# Patient Record
Sex: Male | Born: 1950
Health system: Southern US, Community
[De-identification: ages and names within clinical notes are randomized; demographics above are authoritative.]

## PROBLEM LIST (undated history)

## (undated) DIAGNOSIS — K219 Gastro-esophageal reflux disease without esophagitis: Secondary | ICD-10-CM

## (undated) DIAGNOSIS — H269 Unspecified cataract: Secondary | ICD-10-CM

## (undated) DIAGNOSIS — K5790 Diverticulosis of intestine, part unspecified, without perforation or abscess without bleeding: Secondary | ICD-10-CM

## (undated) DIAGNOSIS — I639 Cerebral infarction, unspecified: Secondary | ICD-10-CM

## (undated) DIAGNOSIS — E119 Type 2 diabetes mellitus without complications: Secondary | ICD-10-CM

## (undated) DIAGNOSIS — Z5189 Encounter for other specified aftercare: Secondary | ICD-10-CM

## (undated) DIAGNOSIS — N411 Chronic prostatitis: Secondary | ICD-10-CM

## (undated) DIAGNOSIS — E2740 Unspecified adrenocortical insufficiency: Secondary | ICD-10-CM

## (undated) DIAGNOSIS — C44510 Basal cell carcinoma of anal skin: Secondary | ICD-10-CM

## (undated) DIAGNOSIS — I10 Essential (primary) hypertension: Secondary | ICD-10-CM

## (undated) DIAGNOSIS — T7840XA Allergy, unspecified, initial encounter: Secondary | ICD-10-CM

## (undated) DIAGNOSIS — J129 Viral pneumonia, unspecified: Secondary | ICD-10-CM

## (undated) DIAGNOSIS — Z9481 Bone marrow transplant status: Secondary | ICD-10-CM

## (undated) DIAGNOSIS — E785 Hyperlipidemia, unspecified: Secondary | ICD-10-CM

## (undated) DIAGNOSIS — F528 Other sexual dysfunction not due to a substance or known physiological condition: Secondary | ICD-10-CM

## (undated) HISTORY — DX: Viral pneumonia, unspecified: J12.9

## (undated) HISTORY — DX: Cerebral infarction, unspecified: I63.9

## (undated) HISTORY — DX: Encounter for other specified aftercare: Z51.89

## (undated) HISTORY — DX: Type 2 diabetes mellitus without complications: E11.9

## (undated) HISTORY — DX: Gastro-esophageal reflux disease without esophagitis: K21.9

## (undated) HISTORY — DX: Diverticulosis of intestine, part unspecified, without perforation or abscess without bleeding: K57.90

## (undated) HISTORY — DX: Hyperlipidemia, unspecified: E78.5

## (undated) HISTORY — DX: Unspecified cataract: H26.9

## (undated) HISTORY — DX: Chronic prostatitis: N41.1

## (undated) HISTORY — PX: OTHER SURGICAL HISTORY: SHX169

## (undated) HISTORY — DX: Allergy, unspecified, initial encounter: T78.40XA

## (undated) HISTORY — DX: Other sexual dysfunction not due to a substance or known physiological condition: F52.8

## (undated) HISTORY — PX: SCALP LESION REMOVAL W/ FLAP AND SKIN GRAFT: SHX2376

## (undated) HISTORY — DX: Bone marrow transplant status: Z94.81

## (undated) HISTORY — DX: Basal cell carcinoma of anal skin: C44.510

## (undated) HISTORY — DX: Essential (primary) hypertension: I10

## (undated) HISTORY — PX: EYE SURGERY: SHX253

---

## 2004-12-25 ENCOUNTER — Ambulatory Visit: Payer: Self-pay | Admitting: Internal Medicine

## 2004-12-27 ENCOUNTER — Encounter: Admission: RE | Admit: 2004-12-27 | Discharge: 2004-12-27 | Payer: Self-pay | Admitting: Internal Medicine

## 2005-01-04 ENCOUNTER — Ambulatory Visit: Payer: Self-pay | Admitting: Internal Medicine

## 2005-01-18 ENCOUNTER — Ambulatory Visit: Payer: Self-pay | Admitting: Internal Medicine

## 2005-07-25 ENCOUNTER — Ambulatory Visit: Payer: Self-pay | Admitting: Internal Medicine

## 2005-07-28 ENCOUNTER — Ambulatory Visit: Payer: Self-pay | Admitting: Internal Medicine

## 2005-12-27 ENCOUNTER — Ambulatory Visit: Payer: Self-pay | Admitting: Internal Medicine

## 2006-01-03 ENCOUNTER — Ambulatory Visit: Payer: Self-pay | Admitting: Internal Medicine

## 2006-02-06 ENCOUNTER — Ambulatory Visit: Payer: Self-pay | Admitting: Internal Medicine

## 2006-02-28 ENCOUNTER — Ambulatory Visit: Payer: Self-pay | Admitting: Internal Medicine

## 2007-01-24 ENCOUNTER — Ambulatory Visit: Payer: Self-pay | Admitting: Internal Medicine

## 2007-01-24 LAB — CONVERTED CEMR LAB
Albumin: 4.1 g/dL (ref 3.5–5.2)
Alkaline Phosphatase: 49 units/L (ref 39–117)
BUN: 13 mg/dL (ref 6–23)
Basophils Absolute: 0 10*3/uL (ref 0.0–0.1)
Bilirubin Urine: NEGATIVE
Cholesterol: 174 mg/dL (ref 0–200)
Eosinophils Absolute: 0.1 10*3/uL (ref 0.0–0.6)
GFR calc Af Amer: 99 mL/min
GFR calc non Af Amer: 82 mL/min
Glucose, Urine, Semiquant: NEGATIVE
HCT: 43 % (ref 39.0–52.0)
HDL: 43 mg/dL (ref >39.0)
Hemoglobin: 15 g/dL (ref 13.0–17.0)
Ketones, urine, test strip: NEGATIVE
LDL Cholesterol: 104 mg/dL — ABNORMAL HIGH (ref 0–99)
MCHC: 34.8 g/dL (ref 30.0–36.0)
MCV: 91.6 fL (ref 78.0–100.0)
Monocytes Absolute: 0.5 10*3/uL (ref 0.2–0.7)
Monocytes Relative: 10.2 % (ref 3.0–11.0)
Neutro Abs: 2.7 10*3/uL (ref 1.4–7.7)
Neutrophils Relative %: 55.2 % (ref 43.0–77.0)
PSA, Free Pct: 22 — ABNORMAL LOW (ref >25)
PSA, Free: 0.4 ng/mL
PSA: 1.8 ng/mL (ref 0.10–4.00)
Potassium: 4.1 meq/L (ref 3.5–5.1)
Protein, U semiquant: NEGATIVE
RDW: 12.6 % (ref 11.5–14.6)
Sodium: 141 meq/L (ref 135–145)
TSH: 3.44 microintl units/mL (ref 0.35–5.50)
Total Bilirubin: 1.5 mg/dL — ABNORMAL HIGH (ref 0.3–1.2)
Urobilinogen, UA: 0.2
pH: 6

## 2007-02-04 DIAGNOSIS — I10 Essential (primary) hypertension: Secondary | ICD-10-CM

## 2007-02-04 DIAGNOSIS — E785 Hyperlipidemia, unspecified: Secondary | ICD-10-CM | POA: Insufficient documentation

## 2007-02-04 HISTORY — DX: Hyperlipidemia, unspecified: E78.5

## 2007-02-04 HISTORY — DX: Essential (primary) hypertension: I10

## 2007-02-07 ENCOUNTER — Ambulatory Visit: Payer: Self-pay | Admitting: Internal Medicine

## 2007-02-07 DIAGNOSIS — F528 Other sexual dysfunction not due to a substance or known physiological condition: Secondary | ICD-10-CM | POA: Insufficient documentation

## 2007-02-07 HISTORY — DX: Other sexual dysfunction not due to a substance or known physiological condition: F52.8

## 2007-10-14 ENCOUNTER — Telehealth: Payer: Self-pay | Admitting: Internal Medicine

## 2007-10-30 ENCOUNTER — Telehealth: Payer: Self-pay | Admitting: Internal Medicine

## 2007-12-04 ENCOUNTER — Telehealth: Payer: Self-pay | Admitting: Internal Medicine

## 2008-06-21 ENCOUNTER — Telehealth: Payer: Self-pay | Admitting: Internal Medicine

## 2008-07-01 ENCOUNTER — Ambulatory Visit: Payer: Self-pay | Admitting: Internal Medicine

## 2008-07-01 LAB — CONVERTED CEMR LAB
AST: 25 units/L (ref 0–37)
Alkaline Phosphatase: 45 units/L (ref 39–117)
Bilirubin, Direct: 0.2 mg/dL (ref 0.0–0.3)
Cholesterol: 168 mg/dL (ref 0–200)
LDL Cholesterol: 101 mg/dL — ABNORMAL HIGH (ref 0–99)
Total Protein: 6.9 g/dL (ref 6.0–8.3)
Triglycerides: 89 mg/dL (ref 0–149)

## 2008-07-07 ENCOUNTER — Ambulatory Visit: Payer: Self-pay | Admitting: Internal Medicine

## 2008-07-07 DIAGNOSIS — R972 Elevated prostate specific antigen [PSA]: Secondary | ICD-10-CM

## 2008-07-07 LAB — CONVERTED CEMR LAB: LDL Goal: 130 mg/dL

## 2008-09-01 ENCOUNTER — Ambulatory Visit: Payer: Self-pay | Admitting: Internal Medicine

## 2008-09-01 LAB — CONVERTED CEMR LAB
PSA, Free Pct: 19 — ABNORMAL LOW (ref >25)
PSA, Free: 0.4 ng/mL
PSA: 1.96 ng/mL (ref 0.10–4.00)

## 2008-09-08 ENCOUNTER — Ambulatory Visit: Payer: Self-pay | Admitting: Internal Medicine

## 2008-12-10 ENCOUNTER — Ambulatory Visit: Payer: Self-pay | Admitting: Internal Medicine

## 2008-12-10 LAB — CONVERTED CEMR LAB
Albumin: 3.8 g/dL (ref 3.5–5.2)
BUN: 14 mg/dL (ref 6–23)
Basophils Relative: 0.5 % (ref 0.0–3.0)
Bilirubin Urine: NEGATIVE
Cholesterol: 144 mg/dL (ref 0–200)
Creatinine, Ser: 1.1 mg/dL (ref 0.4–1.5)
Eosinophils Absolute: 0.1 10*3/uL (ref 0.0–0.7)
GFR calc non Af Amer: 72.75 mL/min (ref >60)
Hemoglobin, Urine: NEGATIVE
Hemoglobin: 14.9 g/dL (ref 13.0–17.0)
Leukocytes, UA: NEGATIVE
Lymphs Abs: 1.5 10*3/uL (ref 0.7–4.0)
MCHC: 33.9 g/dL (ref 30.0–36.0)
MCV: 92.1 fL (ref 78.0–100.0)
Monocytes Absolute: 0.5 10*3/uL (ref 0.1–1.0)
Neutro Abs: 2.9 10*3/uL (ref 1.4–7.7)
Nitrite: NEGATIVE
PSA: 2.93 ng/mL (ref 0.10–4.00)
Potassium: 4 meq/L (ref 3.5–5.1)
RBC: 4.77 M/uL (ref 4.22–5.81)
RDW: 12.9 % (ref 11.5–14.6)
TSH: 2.74 microintl units/mL (ref 0.35–5.50)
Total Protein, Urine: NEGATIVE mg/dL
Total Protein: 6.8 g/dL (ref 6.0–8.3)
Triglycerides: 108 mg/dL (ref 0.0–149.0)
Urobilinogen, UA: 0.2 (ref 0.0–1.0)
VLDL: 21.6 mg/dL (ref 0.0–40.0)

## 2008-12-17 ENCOUNTER — Ambulatory Visit: Payer: Self-pay | Admitting: Internal Medicine

## 2008-12-17 DIAGNOSIS — N411 Chronic prostatitis: Secondary | ICD-10-CM

## 2008-12-17 HISTORY — DX: Chronic prostatitis: N41.1

## 2009-02-03 ENCOUNTER — Ambulatory Visit: Payer: Self-pay | Admitting: Internal Medicine

## 2009-02-09 ENCOUNTER — Encounter: Payer: Self-pay | Admitting: Internal Medicine

## 2009-02-09 ENCOUNTER — Ambulatory Visit: Payer: Self-pay | Admitting: Internal Medicine

## 2009-02-09 DIAGNOSIS — C44599 Other specified malignant neoplasm of skin of other part of trunk: Secondary | ICD-10-CM

## 2009-08-11 ENCOUNTER — Ambulatory Visit: Payer: Self-pay | Admitting: Internal Medicine

## 2009-08-11 ENCOUNTER — Telehealth: Payer: Self-pay | Admitting: Internal Medicine

## 2009-08-11 LAB — CONVERTED CEMR LAB: PSA: 2.52 ng/mL (ref 0.10–4.00)

## 2009-08-19 ENCOUNTER — Ambulatory Visit: Payer: Self-pay | Admitting: Internal Medicine

## 2009-11-03 ENCOUNTER — Encounter (INDEPENDENT_AMBULATORY_CARE_PROVIDER_SITE_OTHER): Payer: Self-pay | Admitting: *Deleted

## 2009-12-06 ENCOUNTER — Encounter (INDEPENDENT_AMBULATORY_CARE_PROVIDER_SITE_OTHER): Payer: Self-pay | Admitting: *Deleted

## 2009-12-08 ENCOUNTER — Ambulatory Visit: Payer: Self-pay | Admitting: Gastroenterology

## 2009-12-19 ENCOUNTER — Ambulatory Visit: Payer: Self-pay | Admitting: Gastroenterology

## 2009-12-20 ENCOUNTER — Encounter: Payer: Self-pay | Admitting: Gastroenterology

## 2009-12-23 ENCOUNTER — Ambulatory Visit: Payer: Self-pay | Admitting: Internal Medicine

## 2009-12-23 LAB — CONVERTED CEMR LAB
Basophils Absolute: 0 10*3/uL (ref 0.0–0.1)
Bilirubin, Direct: 0.2 mg/dL (ref 0.0–0.3)
Eosinophils Absolute: 0.1 10*3/uL (ref 0.0–0.7)
Eosinophils Relative: 2.6 % (ref 0.0–5.0)
GFR calc non Af Amer: 80 mL/min (ref >60)
Glucose, Bld: 89 mg/dL (ref 70–99)
HDL: 41.4 mg/dL (ref >39.00)
Leukocytes, UA: NEGATIVE
MCHC: 34.5 g/dL (ref 30.0–36.0)
MCV: 92 fL (ref 78.0–100.0)
Monocytes Absolute: 0.5 10*3/uL (ref 0.1–1.0)
Neutrophils Relative %: 46.7 % (ref 43.0–77.0)
Nitrite: NEGATIVE
PSA: 2.45 ng/mL (ref 0.10–4.00)
Platelets: 208 10*3/uL (ref 150.0–400.0)
Potassium: 4.1 meq/L (ref 3.5–5.1)
Sodium: 140 meq/L (ref 135–145)
Specific Gravity, Urine: 1.015 (ref 1.000–1.030)
Total Bilirubin: 0.9 mg/dL (ref 0.3–1.2)
VLDL: 18 mg/dL (ref 0.0–40.0)
WBC: 4.2 10*3/uL — ABNORMAL LOW (ref 4.5–10.5)
pH: 6 (ref 5.0–8.0)

## 2010-01-17 ENCOUNTER — Ambulatory Visit: Payer: Self-pay | Admitting: Internal Medicine

## 2010-07-07 ENCOUNTER — Ambulatory Visit
Admission: RE | Admit: 2010-07-07 | Discharge: 2010-07-07 | Payer: Self-pay | Source: Home / Self Care | Attending: Internal Medicine | Admitting: Internal Medicine

## 2010-07-07 ENCOUNTER — Other Ambulatory Visit: Payer: Self-pay | Admitting: Internal Medicine

## 2010-07-07 LAB — LIPID PANEL
Cholesterol: 156 mg/dL (ref 0–200)
HDL: 48.7 mg/dL (ref >39.00)
LDL Cholesterol: 85 mg/dL (ref 0–99)
Total CHOL/HDL Ratio: 3
Triglycerides: 113 mg/dL (ref 0.0–149.0)
VLDL: 22.6 mg/dL (ref 0.0–40.0)

## 2010-07-07 LAB — HEPATIC FUNCTION PANEL
ALT: 33 U/L (ref 0–53)
AST: 24 U/L (ref 0–37)
Albumin: 3.8 g/dL (ref 3.5–5.2)
Alkaline Phosphatase: 44 U/L (ref 39–117)
Bilirubin, Direct: 0.1 mg/dL (ref 0.0–0.3)
Total Bilirubin: 1 mg/dL (ref 0.3–1.2)
Total Protein: 6.7 g/dL (ref 6.0–8.3)

## 2010-07-13 ENCOUNTER — Encounter: Payer: Self-pay | Admitting: *Deleted

## 2010-07-14 ENCOUNTER — Ambulatory Visit
Admission: RE | Admit: 2010-07-14 | Discharge: 2010-07-14 | Payer: Self-pay | Source: Home / Self Care | Attending: Internal Medicine | Admitting: Internal Medicine

## 2010-07-14 ENCOUNTER — Encounter: Payer: Self-pay | Admitting: *Deleted

## 2010-07-14 DIAGNOSIS — H906 Mixed conductive and sensorineural hearing loss, bilateral: Secondary | ICD-10-CM | POA: Insufficient documentation

## 2010-07-14 DIAGNOSIS — M5010 Cervical disc disorder with radiculopathy, unspecified cervical region: Secondary | ICD-10-CM

## 2010-07-14 DIAGNOSIS — G4733 Obstructive sleep apnea (adult) (pediatric): Secondary | ICD-10-CM | POA: Insufficient documentation

## 2010-07-14 NOTE — Assessment & Plan Note (Signed)
Neck exercises and ergonomic changes at work and if persists will obtain xrays

## 2010-07-14 NOTE — Progress Notes (Signed)
Subjective:     Patient ID: Brandon [name] is a 60 y.o. male.  Hyperlipidemia This is a chronic problem. The current episode started more than 1 year ago. The problem is controlled. Recent lipid tests were reviewed and are normal. He has no history of diabetes, hypothyroidism, liver disease or obesity. Factors aggravating his hyperlipidemia include fatty foods. Pertinent negatives include no chest pain, focal sensory loss, focal weakness, leg pain, myalgias or shortness of breath. Current antihyperlipidemic treatment includes statins. The current treatment provides moderate improvement of lipids. Compliance problems include adherence to diet and adherence to exercise.  Risk factors for coronary artery disease include male sex, family history and dyslipidemia.  Hypertension This is a chronic problem. The current episode started more than 1 year ago. The problem has been gradually improving since onset. The problem is controlled. Pertinent negatives include no chest pain or shortness of breath. Agents associated with hypertension include NSAIDs. Risk factors for coronary artery disease include dyslipidemia, family history and male gender. Past treatments include beta blockers. The current treatment provides significant improvement. There are no compliance problems.  There is no history of angina, kidney disease, CAD/MI or left ventricular hypertrophy. Identifiable causes of hypertension include sleep apnea.  Erectile Dysfunction This is a chronic problem. The current episode started more than 1 year ago. The nature of his difficulty is achieving erection and maintaining erection. Non-physiologic factors contributing to erectile dysfunction are a decreased libido. He reports his erection duration to be 5 to 10 minutes. The symptoms are aggravated by poor sleep and stress. Past treatments include vardenafil. The treatment provided moderate relief. He has been using treatment for 1 to 6 months. Medication  reaction: GERD. Risk factors include hypertension.    The following portions of the patient's history were reviewed and updated as appropriate: allergies, current medications, past family history, past medical history, past social history, past surgical history and problem list.  Review of Systems  Constitutional: Positive for fatigue.  HENT: Negative.   Respiratory: Negative.  Negative for shortness of breath.   Cardiovascular: Negative.  Negative for chest pain.  Gastrointestinal: Negative.   Genitourinary: Positive for decreased libido.  Musculoskeletal: Positive for arthralgias. Negative for myalgias.       Neck shoulder and arm pain  Skin: Negative.   Neurological: Negative for focal weakness.  Psychiatric/Behavioral: Negative.        Objective:   Physical Exam  Constitutional: He is oriented to person, place, and time. He appears well-developed and well-nourished.  HENT:  Head: Normocephalic and atraumatic.  Eyes: Conjunctivae are normal. Pupils are equal, round, and reactive to light.  Neck: Normal range of motion. Neck supple.  Cardiovascular: Normal rate and regular rhythm.   Pulmonary/Chest: Effort normal.  Musculoskeletal: Normal range of motion.  Neurological: He is alert and oriented to person, place, and time.       Assessment:         Plan:

## 2010-07-14 NOTE — Assessment & Plan Note (Signed)
The pts lipids are still at goal but they were better in the summer when he had lost weight and was exercising regularly. The pts reviewed diet and exercized goals and set goal of 10 pounds of weight loss Labs reviewed with pt

## 2010-07-14 NOTE — Assessment & Plan Note (Signed)
The pt remains at goal with high rate of medication compliance

## 2010-07-14 NOTE — Assessment & Plan Note (Signed)
Refill of cialis as needed and  Suggest plant to address social issues

## 2010-07-26 NOTE — Letter (Signed)
Summary: Previsit letter  [facility]  [address] [place], [place] [address]   Phone: [phone]  Fax: [phone]       11/03/2009 MRN: [id number]  [facility] [address] [place], [place]  [address]  Dear Mr. [facility],  Welcome to the Gastroenterology Division at [facility].    You are scheduled to see a nurse for your pre-procedure visit on 12-01-09  at 10:30am on the 3rd floor at [facility], [address]  We ask that you try to arrive at our office 15 minutes prior to your appointment time to allow for check-in.  Your nurse visit will consist of discussing your medical and surgical history, your immediate family medical history, and your medications.    Please bring a complete list of all your medications or, if you prefer, bring the medication bottles and we will list them.  We will need to be aware of both prescribed and over the counter drugs.  We will need to know exact dosage information as well.  If you are on blood thinners (Coumadin, Plavix, Aggrenox, Ticlid, etc.) please call our office today/prior to your appointment, as we need to consult with your physician about holding your medication.   Please be prepared to read and sign documents such as consent forms, a financial agreement, and acknowledgement forms.  If necessary, and with your consent, a friend or relative is welcome to sit-in on the nurse visit with you.  Please bring your insurance card so that we may make a copy of it.  If your insurance requires a referral to see a specialist, please bring your referral form from your primary care physician.  No co-pay is required for this nurse visit.     If you cannot keep your appointment, please call [phone] to cancel or reschedule prior to your appointment date.  This allows [place] the opportunity to schedule an appointment for another patient in need of care.    Thank you for choosing [facility] for your medical  needs.  We appreciate the opportunity to care for you.  Please visit [place] at our website  to learn more about our practice.                     Sincerely.                                                                                                                   The Gastroenterology Division

## 2010-07-26 NOTE — Assessment & Plan Note (Signed)
Summary: cpx /njr/pt rsc/cjr   Vital Signs:  Patient profile:   60 year old male Height:      72 inches Weight:      213 pounds BMI:     28.99 Temp:     98.5 degrees F oral Pulse rate:   61 / minute BP sitting:   110 / 80  (left arm) Cuff size:   large  Vitals Entered By: Kathrynn Speed CMA (January 17, 2010 2:28 PM)  Nutrition Counseling: Patient's BMI is greater than 25 and therefore counseled on weight management options. CC: cpx review labs, src   CC:  cpx review labs and src.  History of Present Illness: The pt was asked about all immunizations, health maint. services that are appropriate to their age and was given guidance on diet exercize  and weight management   Current Medications (verified): 1)  Zocor 40 Mg  Tabs (Simvastatin) .... Once Dailyno More Without Ov 2)  Toprol Xl 50 Mg Tb24 (Metoprolol Succinate) .... 1/2 Once Dailyno More With Ov 3)  Aspirin 81 Mg  Tbec (Aspirin) .... Once Daily 4)  Cvs Folic Acid 400 Mcg  Tabs (Folic Acid) .... Once Daily 5)  Multivitamins   Caps (Multiple Vitamin) .... Once Daily 6)  Cialis 5 Mg Tabs (Tadalafil) .... Take As Directed  Allergies (verified): 1)  ! * Iv Dye  Past History:  Family History: Last updated: 02/07/2007 father with alzheimers mother CVA and CA in her 62's Family History Breast cancer 1st degree relative <50 Family History Hypertension  Social History: Last updated: 02/07/2007 Married Never Smoked Alcohol use-yes Drug use-no Regular exercise-yes Occupation: teaching  Risk Factors: Exercise: yes (02/07/2007)  Risk Factors: Smoking Status: never (08/19/2009)  Past medical, surgical, family and social histories (including risk factors) reviewed, and no changes noted (except as noted below).  Past Medical History: Reviewed history from 02/07/2007 and no changes required. Hyperlipidemia Hypertension hypognadism  Past Surgical History: Reviewed history from 02/07/2007 and no changes  required. Denies surgical history  Family History: Reviewed history from 02/07/2007 and no changes required. father with alzheimers mother CVA and CA in her 46's Family History Breast cancer 1st degree relative <50 Family History Hypertension  Social History: Reviewed history from 02/07/2007 and no changes required. Married Never Smoked Alcohol use-yes Drug use-no Regular exercise-yes Occupation: teaching  Review of Systems  The patient denies anorexia, fever, weight loss, weight gain, vision loss, decreased hearing, hoarseness, chest pain, syncope, dyspnea on exertion, peripheral edema, prolonged cough, headaches, hemoptysis, abdominal pain, melena, hematochezia, severe indigestion/heartburn, hematuria, incontinence, genital sores, muscle weakness, suspicious skin lesions, transient blindness, difficulty walking, depression, unusual weight change, abnormal bleeding, enlarged lymph nodes, angioedema, and breast masses.    Physical Exam  General:  Well-developed,well-nourished,in no acute distress; alert,appropriate and cooperative throughout examination Head:  male-pattern balding.   Eyes:  No corneal or conjunctival inflammation noted. EOMI. Perrla. Funduscopic exam benign, without hemorrhages, exudates or papilledema. Vision grossly normal. Ears:  R ear normal and L ear normal.   Nose:  no external deformity and no nasal discharge.   Mouth:  good dentition and pharynx pink and moist.   Neck:  No deformities, masses, or tenderness noted. Lungs:  Normal respiratory effort, chest expands symmetrically. Lungs are clear to auscultation, no crackles or wheezes. Heart:  Normal rate and regular rhythm. S1 and S2 normal without gallop, murmur, click, rub or other extra sounds. Abdomen:  Bowel sounds positive,abdomen soft and non-tender without masses, organomegaly or hernias noted. Msk:  No deformity or scoliosis noted of thoracic or lumbar spine.   Pulses:  R and L  carotid,radial,femoral,dorsalis pedis and posterior tibial pulses are full and equal bilaterally Extremities:  No clubbing, cyanosis, edema, or deformity noted with normal full range of motion of all joints.   Neurologic:  alert & oriented X3.     Impression & Recommendations:  Problem # 1:  PREVENTIVE HEALTH CARE (ICD-V70.0) The pt was asked about all immunizations, health maint. services that are appropriate to their age and was given guidance on diet exercize  and weight management  Colonoscopy: DONE (12/19/2009) Td Booster: Td (12/05/2005)   Chol: 128 (12/23/2009)   HDL: 41.40 (12/23/2009)   LDL: 69 (12/23/2009)   TG: 90.0 (12/23/2009) TSH: 2.34 (12/23/2009)   PSA: 2.45 (12/23/2009) Next Colonoscopy due:: 11/2014 (12/19/2009)  Discussed using sunscreen, use of alcohol, drug use, self testicular exam, routine dental care, routine eye care, routine physical exam, seat belts, multiple vitamins, osteoporosis prevention, adequate calcium intake in diet, and recommendations for immunizations.  Discussed exercise and checking cholesterol.  Discussed gun safety, safe sex, and contraception. Also recommend checking PSA.  Complete Medication List: 1)  Zocor 40 Mg Tabs (Simvastatin) .... Once dailyno more without ov 2)  Toprol Xl 50 Mg Tb24 (Metoprolol succinate) .... 1/2 once dailyno more with ov 3)  Aspirin 81 Mg Tbec (Aspirin) .... Once daily 4)  Cvs Folic Acid 400 Mcg Tabs (Folic acid) .... Once daily 5)  Multivitamins Caps (Multiple vitamin) .... Once daily 6)  Cialis 5 Mg Tabs (Tadalafil) .... Take as directed  Other Orders: EKG w/ Interpretation (93000)  Patient Instructions: 1)  Please schedule a follow-up appointment in 6 months. 2)  Hepatic Panel prior to visit, ICD-9:995.20 3)  Lipid Panel prior to visit, ICD-9:272.4

## 2010-07-26 NOTE — Letter (Signed)
Summary: [facility] Instructions  [facility]  [address] [place], [place] [address]   Phone: [phone]  Fax: [phone]       Brandon [name]    April 29, 1951    MRN: [id number]        Procedure Day Dorna Bloom:  Duanne Limerick  12/19/09     Arrival Time:  8:00am     Procedure Time:  9:00am     Location of Procedure:                    Juliann Pares  [facility] (4th Floor)                        PREPARATION FOR COLONOSCOPY WITH MOVIPREP   Starting 5 days prior to your procedure  [facility] 12/14/09 do not eat nuts, seeds, popcorn, corn, beans, peas,  salads, or any raw vegetables.  Do not take any fiber supplements (e.g. Metamucil, Citrucel, and Benefiber).  THE DAY BEFORE YOUR PROCEDURE         DATE:  SUNDAY  12/18/09  1.  Drink clear liquids the entire day-NO SOLID FOOD  2.  Do not drink anything colored red or purple.  Avoid juices with pulp.  No orange juice.  3.  Drink at least 64 oz. (8 glasses) of fluid/clear liquids during the day to prevent dehydration and help the prep work efficiently.  CLEAR LIQUIDS INCLUDE: Water Jello Ice Popsicles Tea (sugar ok, no milk/cream) Powdered fruit flavored drinks Coffee (sugar ok, no milk/cream) Gatorade Juice: apple, white grape, white cranberry  Lemonade Clear bullion, consomm, broth Carbonated beverages (any kind) Strained chicken noodle soup Hard Candy                             4.  In the morning, mix first dose of MoviPrep solution:    Empty 1 Pouch A and 1 Pouch B into the disposable container    Add lukewarm drinking water to the top line of the container. Mix to dissolve    Refrigerate (mixed solution should be used within 24 hrs)  5.  Begin drinking the prep at 5:00 p.m. The MoviPrep container is divided by 4 marks.   Every 15 minutes drink the solution down to the next mark (approximately 8 oz) until the full liter is complete.   6.  Follow completed prep with 16 oz of clear liquid of your choice  (Nothing red or purple).  Continue to drink clear liquids until bedtime.  7.  Before going to bed, mix second dose of MoviPrep solution:    Empty 1 Pouch A and 1 Pouch B into the disposable container    Add lukewarm drinking water to the top line of the container. Mix to dissolve    Refrigerate  THE DAY OF YOUR PROCEDURE      DATE:  MONDAY  06/27  Beginning at  4:00 a.m. (5 hours before procedure):         1. Every 15 minutes, drink the solution down to the next mark (approx 8 oz) until the full liter is complete.  2. Follow completed prep with 16 oz. of clear liquid of your choice.    3. You may drink clear liquids until 7:00am  (2 HOURS BEFORE PROCEDURE).   MEDICATION INSTRUCTIONS  Unless otherwise instructed, you should take regular prescription medications with a small sip of water   as  early as possible the morning of your procedure.        OTHER INSTRUCTIONS  You will need a responsible adult at least 61 years of age to accompany you and drive you home.   This person must remain in the waiting room during your procedure.  Wear loose fitting clothing that is easily removed.  Leave jewelry and other valuables at home.  However, you may wish to bring a book to read or  an iPod/MP3 player to listen to music as you wait for your procedure to start.  Remove all body piercing jewelry and leave at home.  Total time from sign-in until discharge is approximately 2-3 hours.  You should go home directly after your procedure and rest.  You can resume normal activities the  day after your procedure.  The day of your procedure you should not:   Drive   Make legal decisions   Operate machinery   Drink alcohol   Return to work  You will receive specific instructions about eating, activities and medications before you leave.    The above instructions have been reviewed and explained to me by   Wyona Almas RN  December 08, 2009 9:34 AM     I fully understand and  can verbalize these instructions _____________________________ Date _________

## 2010-07-26 NOTE — Miscellaneous (Signed)
Summary: [facility] Previsit/prep  Clinical Lists Changes  Medications: Added new medication of MOVIPREP 100 GM  SOLR (PEG-KCL-NACL-NASULF-NA ASC-C) As per prep instructions. - Signed Rx of MOVIPREP 100 GM  SOLR (PEG-KCL-NACL-NASULF-NA ASC-C) As per prep instructions.;  #1 x 0;  Signed;  Entered by: Wyona Almas RN;  Authorized by: Meryl Dare MD Clementeen Graham;  Method used: Electronically to [id number] [address], [place], [place], [place]  [address], Ph: [phone] or [phone], Fax: [phone] Allergies: Added new allergy or adverse reaction of * IV DYE Observations: Added new observation of NKA: F (12/08/2009 8:57)    Prescriptions: MOVIPREP 100 GM  SOLR (PEG-KCL-NACL-NASULF-NA ASC-C) As per prep instructions.  #1 x 0   Entered by:   Wyona Almas RN   Authorized by:   Meryl Dare MD [facility]   Signed by:   Wyona Almas RN on 12/08/2009   Method used:   Electronically to        [facility] [phone]* (retail)       [address]       [place], [place]  [address]       Ph: [phone] or [phone]       Fax: [phone]   RxID:   [id number]

## 2010-07-26 NOTE — Procedures (Signed)
Summary: Colonoscopy  Patient: Brandon [name] Note: All result statuses are Final unless otherwise noted.  Tests: (1) Colonoscopy (COL)   COL Colonoscopy           DONE     [facility]     [address]     [place], [place]  [address]           COLONOSCOPY PROCEDURE REPORT           PATIENT:  Brandon [name], Brandon [name]  MR#:  [id number]     BIRTHDATE:  Jan 03, 1951, 59 yrs. old  GENDER:  male     ENDOSCOPIST:  Judie Petit T. Russella Dar, MD, [facility]     Referred by:  Stacie Glaze, M.D.     PROCEDURE DATE:  12/19/2009     PROCEDURE:  Colonoscopy with snare polypectomy     ASA CLASS:  Class II     INDICATIONS:  1) Routine Risk Screening     MEDICATIONS:   Fentanyl 75 mcg IV, Versed 7 mg IV     DESCRIPTION OF PROCEDURE:   After the risks benefits and     alternatives of the procedure were thoroughly explained, informed     consent was obtained.  Digital rectal exam was performed and     revealed no abnormalities.   The LB [id number] endoscope     was introduced through the anus and advanced to the cecum, which     was identified by both the appendix and ileocecal valve, without     limitations.  The quality of the prep was excellent, using     MoviPrep.  The instrument was then slowly withdrawn as the colon     was fully examined.     <<PROCEDUREIMAGES>>     FINDINGS:  Two polyps were found in the ascending colon. They were     3-5 mm in size. Polyps were snared without cautery. Retrieval was     successful. Mild diverticulosis was found in the sigmoid colon.  A     normal appearing cecum, ileocecal valve, and appendiceal orifice     were identified. The hepatic flexure, transverse, splenic flexure,     descending colon, and rectum appeared unremarkable. Retroflexed     views in the rectum revealed internal hemorrhoids, small.  The     time to cecum =  3.25  minutes. The scope was then withdrawn (time     =  11.25  min) from the patient and the procedure completed.        COMPLICATIONS:  None           ENDOSCOPIC IMPRESSION:     1) 3 - 5 mm, two polyps in the ascending colon     2) Mild diverticulosis in the sigmoid colon     3) Internal hemorrhoids           RECOMMENDATIONS:     1) Await pathology results     2) High fiber diet with liberal fluid intake.     3) If the polyps removed today are adenomatous (pre-cancerous),     you will need a repeat colonoscopy in 5 years. Otherwise you     should continue to follow colorectal cancer screening guidelines     for "routine risk" patients with colonoscopy in 10 years.     Venita Lick. Russella Dar, MD, Clementeen Graham           n.     eSIGNED:   Venita Lick. Russella Dar at  12/19/2009 09:21 AM           Junius Argyle, [id number]  Note: An exclamation mark (!) indicates a result that was not dispersed into the flowsheet. Document Creation Date: 12/19/2009 9:22 AM _______________________________________________________________________  (1) Order result status: Final Collection or observation date-time: 12/19/2009 09:18 Requested date-time:  Receipt date-time:  Reported date-time:  Referring Physician:   Ordering Physician: Claudette Head [phone]) Specimen Source:  Source: Launa Grill Order Number: [id number] Lab site:   Appended Document: Colonoscopy     Procedures Next Due Date:    Colonoscopy: 11/2014

## 2010-07-26 NOTE — Letter (Signed)
Summary: Patient Notice- Polyp Results  [facility]  [address] [place], [place] [address]   Phone: [phone]  Fax: [phone]        December 20, 2009 MRN: [id number]    [facility] [address] [place], [place]  [address]    Dear Mr. Tiemann,  I am pleased to inform you that the colon polyp(s) removed during your recent colonoscopy was (were) found to be benign (no cancer detected) upon pathologic examination.  I recommend you have a repeat colonoscopy examination in 5 years to look for recurrent polyps, as having colon polyps increases your risk for having recurrent polyps or even colon cancer in the future.  Should you develop new or worsening symptoms of abdominal pain, bowel habit changes or bleeding from the rectum or bowels, please schedule an evaluation with either your primary care physician or with me.  Continue treatment plan as outlined the day of your exam.  Please call us if you are having persistent problems or have questions about your condition that have not been fully answered at this time.  Sincerely,  Meryl Dare MD [facility]  This letter has been electronically signed by your physician.  Appended Document: Patient Notice- Polyp Results letter mailed 7.6.11

## 2010-07-26 NOTE — Assessment & Plan Note (Signed)
Summary: 6 month rov/njr   Vital Signs:  Patient profile:   60 year old male Height:      72 inches Weight:      220 pounds BMI:     29.95 Temp:     98.2 degrees F oral Pulse rate:   72 / minute Resp:     14 per minute BP sitting:   110 / 80  (left arm)  Vitals Entered By: Willy Eddy, LPN (August 19, 2009 4:27 PM) CC: roas labs   CC:  roas labs.  History of Present Illness: THE PT PRESENT FOR PROSTATE INFECTION AND ELEVATED PSA  Preventive Screening-Counseling & Management  Alcohol-Tobacco     Smoking Status: never  Problems Prior to Update: 1)  Neoplasm, Malignant, Skin, Back  (ICD-173.5) 2)  Chronic Prostatitis  (ICD-601.1) 3)  Prostate Specific Antigen, Elevated  (ICD-790.93) 4)  Family History Breast Cancer 1st Degree Relative <50  (ICD-V16.3) 5)  Erectile Dysfunction  (ICD-302.72) 6)  Hypertension  (ICD-401.9) 7)  Hyperlipidemia  (ICD-272.4) 8)  Preventive Health Care  (ICD-V70.0)  Current Problems (verified): 1)  Neoplasm, Malignant, Skin, Back  (ICD-173.5) 2)  Chronic Prostatitis  (ICD-601.1) 3)  Prostate Specific Antigen, Elevated  (ICD-790.93) 4)  Family History Breast Cancer 1st Degree Relative <50  (ICD-V16.3) 5)  Erectile Dysfunction  (ICD-302.72) 6)  Hypertension  (ICD-401.9) 7)  Hyperlipidemia  (ICD-272.4) 8)  Preventive Health Care  (ICD-V70.0)  Medications Prior to Update: 1)  Zocor 40 Mg  Tabs (Simvastatin) .... Once Dailyno More Without Ov 2)  Toprol Xl 50 Mg Tb24 (Metoprolol Succinate) .... 1/2 Once Dailyno More With Ov 3)  Aspirin 81 Mg  Tbec (Aspirin) .... Once Daily 4)  Cvs Folic Acid 400 Mcg  Tabs (Folic Acid) .... Once Daily 5)  Multivitamins   Caps (Multiple Vitamin) .... Once Daily 6)  Cialis 5 Mg Tabs (Tadalafil) .... Take As Directed 7)  Malarone 250-100 Mg Tabs (Atovaquone-Proguanil Hcl) .... One By Mouth Daily One Day Prior and 1 Weeks After Trip  Number 16 8)  Ciprofloxacin Hcl 500 Mg Tabs (Ciprofloxacin Hcl) .... One Pp  Two Times A Day For 7 Days 9)  Sulfamethoxazole-Tmp Ds 800-160 Mg Tabs (Sulfamethoxazole-Trimethoprim) .... One By Mouth Two Times A Day For 21 Days  Current Medications (verified): 1)  Zocor 40 Mg  Tabs (Simvastatin) .... Once Dailyno More Without Ov 2)  Toprol Xl 50 Mg Tb24 (Metoprolol Succinate) .... 1/2 Once Dailyno More With Ov 3)  Aspirin 81 Mg  Tbec (Aspirin) .... Once Daily 4)  Cvs Folic Acid 400 Mcg  Tabs (Folic Acid) .... Once Daily 5)  Multivitamins   Caps (Multiple Vitamin) .... Once Daily 6)  Cialis 5 Mg Tabs (Tadalafil) .... Take As Directed 7)  Malarone 250-100 Mg Tabs (Atovaquone-Proguanil Hcl) .... One By Mouth Daily One Day Prior and 1 Weeks After Trip  Number 16 8)  Ciprofloxacin Hcl 500 Mg Tabs (Ciprofloxacin Hcl) .... One Pp Two Times A Day For 7 Days  Allergies (verified): No Known Drug Allergies  Past History:  Family History: Last updated: 02/07/2007 father with alzheimers mother CVA and CA in her 35's Family History Breast cancer 1st degree relative <50 Family History Hypertension  Social History: Last updated: 02/07/2007 Married Never Smoked Alcohol use-yes Drug use-no Regular exercise-yes Occupation: teaching  Risk Factors: Exercise: yes (02/07/2007)  Risk Factors: Smoking Status: never (08/19/2009)  Past medical, surgical, family and social histories (including risk factors) reviewed, and no changes noted (except as  noted below).  Past Medical History: Reviewed history from 02/07/2007 and no changes required. Hyperlipidemia Hypertension hypognadism  Past Surgical History: Reviewed history from 02/07/2007 and no changes required. Denies surgical history  Family History: Reviewed history from 02/07/2007 and no changes required. father with alzheimers mother CVA and CA in her 68's Family History Breast cancer 1st degree relative <50 Family History Hypertension  Social History: Reviewed history from 02/07/2007 and no changes  required. Married Never Smoked Alcohol use-yes Drug use-no Regular exercise-yes Occupation: teaching  Review of Systems  The patient denies anorexia, fever, weight loss, weight gain, vision loss, decreased hearing, hoarseness, chest pain, syncope, dyspnea on exertion, peripheral edema, prolonged cough, headaches, hemoptysis, abdominal pain, melena, hematochezia, severe indigestion/heartburn, hematuria, incontinence, genital sores, muscle weakness, suspicious skin lesions, transient blindness, difficulty walking, depression, unusual weight change, abnormal bleeding, enlarged lymph nodes, angioedema, and breast masses.    Physical Exam  General:  Well-developed,well-nourished,in no acute distress; alert,appropriate and cooperative throughout examination Head:  male-pattern balding.   Ears:  R ear normal and L ear normal.   Nose:  no external deformity and no nasal discharge.   Mouth:  good dentition and pharynx pink and moist.   Neck:  No deformities, masses, or tenderness noted. Lungs:  Normal respiratory effort, chest expands symmetrically. Lungs are clear to auscultation, no crackles or wheezes. Heart:  Normal rate and regular rhythm. S1 and S2 normal without gallop, murmur, click, rub or other extra sounds.   Impression & Recommendations:  Problem # 1:  PROSTATE SPECIFIC ANTIGEN, ELEVATED (ICD-790.93) MONITERING EVERY SIX MONTH  Complete Medication List: 1)  Zocor 40 Mg Tabs (Simvastatin) .... Once dailyno more without ov 2)  Toprol Xl 50 Mg Tb24 (Metoprolol succinate) .... 1/2 once dailyno more with ov 3)  Aspirin 81 Mg Tbec (Aspirin) .... Once daily 4)  Cvs Folic Acid 400 Mcg Tabs (Folic acid) .... Once daily 5)  Multivitamins Caps (Multiple vitamin) .... Once daily 6)  Cialis 5 Mg Tabs (Tadalafil) .... Take as directed 7)  Malarone 250-100 Mg Tabs (Atovaquone-proguanil hcl) .... One by mouth daily one day prior and 1 weeks after trip  number 16 8)  Ciprofloxacin Hcl 500 Mg  Tabs (Ciprofloxacin hcl) .... One pp two times a day for 7 days  Patient Instructions: 1)  Please schedule a follow-up appointment in 6 months.  July CPX  Appended Document: Orders Update     Clinical Lists Changes  Orders: Added new Service order of Est. Patient Level III (16109) - Signed      Appended Document: Orders Update     Clinical Lists Changes  Orders: Added new Referral order of Gastroenterology Referral (GI) - Signed

## 2010-07-26 NOTE — Progress Notes (Signed)
Summary: Rx request  Phone Note From Other Clinic Call back at [facility] Phone [phone] Call back at [phone] cell   Caller: Patient Summary of Call: Pt needs a rx for malerone and cipro in preparation for a 9 day trip to [place]. Please send to [facility] at [address] Initial call taken by: Trixie Dredge,  August 11, 2009 5:00 PM  Follow-up for Phone Call        [facility] 250  per dr Yvonne Kendall have malerone250 mg -start 2 days before going- once daily starting 2 days before arrival and each day while there and 7 days after leaving-cipto 500 two times a day for 7 qdays as needed diarrhea Follow-up by: Willy Eddy, LPN,  August 12, 2009 9:20 AM  Additional Follow-up for Phone Call Additional follow up Details #1::        Rx called. Additional Follow-up by: Raechel Ache, RN,  August 12, 2009 9:42 AM

## 2010-07-27 NOTE — Assessment & Plan Note (Signed)
Summary: 6 month rov/njr   Vital Signs:  Patient profile:   60 year old male Height:      72 inches Weight:      220 pounds BMI:     29.95 Temp:     98.2 degrees F oral Pulse rate:   68 / minute Resp:     14 per minute BP sitting:   130 / 80  (left arm)  Vitals Entered By: Willy Eddy, LPN (July 14, 2010 4:14 PM) CC: roa labs--has been taking otc omeprazole for periodic gerd which helps, Lipid Management Is Patient Diabetic? No Subjective: Patient ID: Brandon [name] is a 60 y.o. male.  Hyperlipidemia  This is a chronic problem. The current episode started more than 1 year ago. The problem is controlled. Recent lipid tests were reviewed and are normal. He has no history of diabetes, hypothyroidism, liver disease or obesity. Factors aggravating his hyperlipidemia include fatty foods. Pertinent negatives include no chest pain, focal sensory loss, focal weakness, leg pain, myalgias or shortness of breath. Current antihyperlipidemic treatment includes statins. The current treatment provides moderate improvement of lipids. Compliance problems include adherence to diet and adherence to exercise. Risk factors for coronary artery disease include male sex, family history and dyslipidemia. Hypertension  This is a chronic problem. The current episode started more than 1 year ago. The problem has been gradually improving since onset. The problem is controlled. Pertinent negatives include no chest pain or shortness of breath. Agents associated with hypertension include NSAIDs. Risk factors for coronary artery disease include dyslipidemia, family history and male gender. Past treatments include beta blockers. The current treatment provides significant improvement. There are no compliance problems. There is no history of angina, kidney disease, CAD/MI or left ventricular hypertrophy. Identifiable causes of hypertension include sleep apnea. Erectile Dysfunction  This is a chronic problem. The  current episode started more than 1 year ago. The nature of his difficulty is achieving erection and maintaining erection. Non-physiologic factors contributing to erectile dysfunction are a decreased libido. He reports his erection duration to be 5 to 10 minutes. The symptoms are aggravated by poor sleep and stress. Past treatments include vardenafil. The treatment provided moderate relief. He has been using treatment for 1 to 6 months. Medication reaction: GERD. Risk factors include hypertension.  The following portions of the patient's history were reviewed and updated as appropriate: allergies, current medications, past family history, past medical history, past social history, past surgical history and problem list.  Review of Systems Constitutional: Positive for fatigue. HENT: Negative. Respiratory: Negative. Negative for shortness of breath. Cardiovascular: Negative. Negative for chest pain. Gastrointestinal: Negative. Genitourinary: Positive for decreased libido. Musculoskeletal: Positive for arthralgias. Negative for myalgias. Neck shoulder and arm pain Skin: Negative. Neurological: Negative for focal weakness. Psychiatric/Behavioral: Negative.   Objective: Physical Exam Constitutional: He is oriented to person, place, and time. He appears well-developed and well-nourished. HENT: Head: Normocephalic and atraumatic. Eyes: Conjunctivae are normal. Pupils are equal, round, and reactive to light. Neck: Normal range of motion. Neck supple. Cardiovascular: Normal rate and regular rhythm. Pulmonary/Chest: Effort normal. Musculoskeletal: Normal range of motion. Neurological: He is alert and oriented to person, place, and time.   Assessment:   Plan:     Primary Care Provider:  Stacie Glaze MD  CC:  roa labs--has been taking otc omeprazole for periodic gerd which helps and Lipid Management.  History of Present Illness: upper extremity joint pain snore and has pauses lipid  follow up hearing loss gerd  with cough better with prilosec prostate   Lipid Management History:      Positive NCEP/ATP III risk factors include male age 32 years old or older and hypertension.  Negative NCEP/ATP III risk factors include non-diabetic, non-tobacco-user status, no ASHD (atherosclerotic heart disease), no prior stroke/TIA, no peripheral vascular disease, and no history of aortic aneurysm.     Preventive Screening-Counseling & Management  Alcohol-Tobacco     Smoking Status: never  Current Problems (verified): 1)  Preventive Health Care  (ICD-V70.0) 2)  Neoplasm, Malignant, Skin, Back  (ICD-173.5) 3)  Chronic Prostatitis  (ICD-601.1) 4)  Prostate Specific Antigen, Elevated  (ICD-790.93) 5)  Family History Breast Cancer 1st Degree Relative <50  (ICD-V16.3) 6)  Erectile Dysfunction  (ICD-302.72) 7)  Hypertension  (ICD-401.9) 8)  Hyperlipidemia  (ICD-272.4) 9)  Preventive Health Care  (ICD-V70.0)  Current Medications (verified): 1)  Zocor 40 Mg  Tabs (Simvastatin) .... Once Dailyno More Without Ov 2)  Toprol Xl 50 Mg Tb24 (Metoprolol Succinate) .... 1/2 Once Dailyno More With Ov 3)  Aspirin 81 Mg  Tbec (Aspirin) .... Once Daily 4)  Cvs Folic Acid 400 Mcg  Tabs (Folic Acid) .... Once Daily 5)  Multivitamins   Caps (Multiple Vitamin) .... Once Daily 6)  Cialis 5 Mg Tabs (Tadalafil) .... Take As Directed 7)  Omeprazole 20 Mg Cpdr (Omeprazole) .Marland Kitchen.. 1 Once Daily As Needed  Allergies (verified): 1)  ! * Iv Dye   Impression & Recommendations:  Problem # 1:  ERECTILE DYSFUNCTION (ICD-302.72)  His updated medication list for this problem includes:    Cialis 5 Mg Tabs (Tadalafil) .Marland Kitchen... Take as directed  Discussed proper use of medications, as well as side effects.   Problem # 2:  HYPERTENSION (ICD-401.9)  His updated medication list for this problem includes:    Toprol Xl 50 Mg Tb24 (Metoprolol succinate) .Marland Kitchen... 1/2 once dailyno more with ov  BP today: 130/80 Prior  BP: 110/80 (01/17/2010)  Prior 10 Yr Risk Heart Disease: 11 % (09/08/2008)  Labs Reviewed: K+: 4.1 (12/23/2009) Creat: : 1.0 (12/23/2009)   Chol: 156 (07/07/2010)   HDL: 48.70 (07/07/2010)   LDL: 85 (07/07/2010)   TG: 113.0 (07/07/2010)  Problem # 3:  HYPERLIPIDEMIA (ICD-272.4)  His updated medication list for this problem includes:    Zocor 40 Mg Tabs (Simvastatin) ..... Once dailyno more without ov  Labs Reviewed: SGOT: 24 (07/07/2010)   SGPT: 33 (07/07/2010)  Lipid Goals: Chol Goal: 200 (07/07/2008)   HDL Goal: 40 (07/07/2008)   LDL Goal: 130 (07/07/2008)   TG Goal: 150 (07/07/2008)  Prior 10 Yr Risk Heart Disease: 11 % (09/08/2008)   HDL:48.70 (07/07/2010), 41.40 (12/23/2009)  LDL:85 (07/07/2010), 69 (12/23/2009)  Chol:156 (07/07/2010), 128 (12/23/2009)  Trig:113.0 (07/07/2010), 90.0 (12/23/2009)  Problem # 4:  SLEEP APNEA, OBSTRUCTIVE, MILD (ICD-327.23)  Complete Medication List: 1)  Zocor 40 Mg Tabs (Simvastatin) .... Once dailyno more without ov 2)  Toprol Xl 50 Mg Tb24 (Metoprolol succinate) .... 1/2 once dailyno more with ov 3)  Aspirin 81 Mg Tbec (Aspirin) .... Once daily 4)  Cvs Folic Acid 400 Mcg Tabs (Folic acid) .... Once daily 5)  Multivitamins Caps (Multiple vitamin) .... Once daily 6)  Cialis 5 Mg Tabs (Tadalafil) .... Take as directed 7)  Omeprazole 20 Mg Cpdr (Omeprazole) .Marland Kitchen.. 1 once daily as needed 8)  Benzoyl Peroxide-erythromycin 5-3 % Gel (Benzoyl peroxide-erythromycin) .... Apply to face two times a day  Other Orders: Audiology (Audio)  Lipid Assessment/Plan:  Based on NCEP/ATP III, the patient's risk factor category is "2 or more risk factors and a calculated 10 year CAD risk of < 20%".  The patient's lipid goals are as follows: Total cholesterol goal is 200; LDL cholesterol goal is 130; HDL cholesterol goal is 40; Triglyceride goal is 150.  His LDL cholesterol goal has been met.    Patient Instructions: 1)  Please schedule a follow-up appointment  in 4 months. 2)  Hepatic Panel prior to visit, ICD-9:995.20 3)  Lipid Panel prior to visit, ICD-9:272.4 Prescriptions: OMEPRAZOLE 20 MG CPDR (OMEPRAZOLE) 1 once daily as needed  #30 x 11   Entered and Authorized by:   Stacie Glaze MD   Signed by:   Stacie Glaze MD on 07/14/2010   Method used:   Electronically to        [facility] [phone]* (retail)       [address]       [place], [place]  [address]       Ph: [phone] or [phone]       Fax: [phone]   RxID:   [id number] BENZOYL PEROXIDE-ERYTHROMYCIN 5-3 % GEL (BENZOYL PEROXIDE-ERYTHROMYCIN) apply to face two times a day  #60gm x 3   Entered and Authorized by:   Stacie Glaze MD   Signed by:   Stacie Glaze MD on 07/14/2010   Method used:   Electronically to        [facility] [phone]* (retail)       [address]       [place], [place]  [address]       Ph: [phone] or [phone]       Fax: [phone]   RxID:   [id number]    Orders Added: 1)  Est. Patient Level IV [83419] 2)  Audiology [Audio]

## 2010-10-16 ENCOUNTER — Other Ambulatory Visit: Payer: Self-pay | Admitting: *Deleted

## 2010-10-16 DIAGNOSIS — K219 Gastro-esophageal reflux disease without esophagitis: Secondary | ICD-10-CM

## 2010-10-17 ENCOUNTER — Telehealth: Payer: Self-pay | Admitting: Internal Medicine

## 2010-10-17 MED ORDER — OMEPRAZOLE MAGNESIUM 20 MG PO TBEC: 20.0000 mg | DELAYED_RELEASE_TABLET | Freq: Every day | ORAL | Status: DC

## 2010-10-17 NOTE — Telephone Encounter (Signed)
Pt called and said that send script for omeprazole otc 42 to [facility] on [address] and [address].

## 2010-11-10 ENCOUNTER — Other Ambulatory Visit: Payer: Self-pay | Admitting: Internal Medicine

## 2010-11-10 ENCOUNTER — Other Ambulatory Visit: Payer: Self-pay

## 2010-11-10 DIAGNOSIS — E785 Hyperlipidemia, unspecified: Secondary | ICD-10-CM

## 2010-11-10 DIAGNOSIS — T887XXA Unspecified adverse effect of drug or medicament, initial encounter: Secondary | ICD-10-CM

## 2010-11-13 ENCOUNTER — Other Ambulatory Visit: Payer: Self-pay | Admitting: Internal Medicine

## 2010-11-13 NOTE — Telephone Encounter (Signed)
rx was faxed to [facility]

## 2010-11-13 NOTE — Telephone Encounter (Signed)
Per drjenkins- may have the oral medication- 1 pill

## 2010-11-13 NOTE — Telephone Encounter (Signed)
rx called in

## 2010-11-13 NOTE — Telephone Encounter (Signed)
Please advise 

## 2010-11-13 NOTE — Telephone Encounter (Signed)
Wants new rx for Oral Typhoid called to [facility]. Going out of town.

## 2010-11-17 ENCOUNTER — Ambulatory Visit: Payer: Self-pay | Admitting: Internal Medicine

## 2010-12-02 ENCOUNTER — Other Ambulatory Visit: Payer: Self-pay | Admitting: Internal Medicine

## 2011-01-16 ENCOUNTER — Encounter: Payer: Self-pay | Admitting: Internal Medicine

## 2011-01-16 ENCOUNTER — Ambulatory Visit (INDEPENDENT_AMBULATORY_CARE_PROVIDER_SITE_OTHER): Payer: BC Managed Care – PPO | Admitting: Internal Medicine

## 2011-01-16 DIAGNOSIS — R109 Unspecified abdominal pain: Secondary | ICD-10-CM

## 2011-01-16 DIAGNOSIS — K5790 Diverticulosis of intestine, part unspecified, without perforation or abscess without bleeding: Secondary | ICD-10-CM | POA: Insufficient documentation

## 2011-01-16 DIAGNOSIS — K5730 Diverticulosis of large intestine without perforation or abscess without bleeding: Secondary | ICD-10-CM

## 2011-01-16 DIAGNOSIS — I10 Essential (primary) hypertension: Secondary | ICD-10-CM

## 2011-01-16 HISTORY — DX: Diverticulosis of intestine, part unspecified, without perforation or abscess without bleeding: K57.90

## 2011-01-16 MED ORDER — METRONIDAZOLE 500 MG PO TABS: 500.0000 mg | ORAL_TABLET | Freq: Three times a day (TID) | ORAL | Status: DC

## 2011-01-16 MED ORDER — CIPROFLOXACIN HCL 500 MG PO TABS: 500.0000 mg | ORAL_TABLET | Freq: Two times a day (BID) | ORAL | Status: DC

## 2011-01-16 MED ORDER — METRONIDAZOLE 500 MG PO TABS: 500.0000 mg | ORAL_TABLET | Freq: Three times a day (TID) | ORAL | Status: AC

## 2011-01-16 NOTE — Patient Instructions (Signed)
Take antibiotic therapy if your abdominal pain worsens or he develops fever  Call or return to clinic prn if these symptoms worsen or fail to improve as anticipated.

## 2011-01-16 NOTE — Progress Notes (Signed)
  Subjective:    Patient ID: Brandon [name], male    DOB: [birth year], 60 y.o.   MRN: [id number]  HPI 60 year old patient who has a history of mild sigmoid diverticulosis. He presents with a two-day history of mildly worsening left lower quadrant pain. Pain was fairly constant throughout the night. Denies any fever nausea vomiting or change in his bowel habits. No similar episodes of similar pain. His appetite is well maintained  Review of Systems  Constitutional: Negative for fever, chills, appetite change and fatigue.  HENT: Negative for hearing loss, ear pain, congestion, sore throat, trouble swallowing, neck stiffness, dental problem, voice change and tinnitus.   Eyes: Negative for pain, discharge and visual disturbance.  Respiratory: Negative for cough, chest tightness, wheezing and stridor.   Cardiovascular: Negative for chest pain, palpitations and leg swelling.  Gastrointestinal: Positive for abdominal pain. Negative for nausea, vomiting, diarrhea, constipation, blood in stool and abdominal distention.  Genitourinary: Negative for urgency, hematuria, flank pain, discharge, difficulty urinating and genital sores.  Musculoskeletal: Negative for myalgias, back pain, joint swelling, arthralgias and gait problem.  Skin: Negative for rash.  Neurological: Negative for dizziness, syncope, speech difficulty, weakness, numbness and headaches.  Hematological: Negative for adenopathy. Does not bruise/bleed easily.  Psychiatric/Behavioral: Negative for behavioral problems and dysphoric mood. The patient is not nervous/anxious.        Objective:   Physical Exam  Constitutional: He appears well-developed and well-nourished. No distress.  Abdominal: Soft. Bowel sounds are normal. He exhibits no distension and no mass. There is tenderness. There is no rebound and no guarding.       Very mild left lower quadrant tenderness without guarding or rebound. Bowel sounds were normal          Assessment  & Plan:   Mild sigmoid diverticulosis. The patient may have mild early diverticulitis. Antibiotic therapy was dispensed but he will hold this for at least one or 2 days and clinically observe if pain worsens or he develops fever will take antibiotic therapy

## 2011-06-10 ENCOUNTER — Other Ambulatory Visit: Payer: Self-pay | Admitting: Internal Medicine

## 2011-06-12 ENCOUNTER — Other Ambulatory Visit: Payer: Self-pay | Admitting: Internal Medicine

## 2011-06-20 ENCOUNTER — Telehealth: Payer: Self-pay

## 2011-06-20 NOTE — Telephone Encounter (Signed)
Pt states he went to Urgent Care on 06/18/11 and was told his lungs sounded all right and was given hycodan cough syrup and Mucinex.   Pt has a fever of 101 today and has been coughing up mucus.  Pt would like to know what Dr. Lovell Sheehan recommends.

## 2011-06-21 ENCOUNTER — Other Ambulatory Visit: Payer: Self-pay | Admitting: *Deleted

## 2011-06-21 MED ORDER — AZITHROMYCIN 250 MG PO TABS: ORAL_TABLET | ORAL | Status: AC

## 2011-06-21 NOTE — Telephone Encounter (Signed)
Had flu vaccine in October at school-- per dr Lovell Sheehan may have z pack and continue taking hycodan-pt informed

## 2011-06-25 ENCOUNTER — Telehealth: Payer: Self-pay | Admitting: *Deleted

## 2011-06-25 ENCOUNTER — Ambulatory Visit: Payer: BC Managed Care – PPO | Admitting: Internal Medicine

## 2011-06-25 NOTE — Telephone Encounter (Signed)
Pt finished zpack yesterday and is still having a fever and cough.  He has been taking mucinex dm.  Per Dr Lovell Sheehan continue current regimen and take advil for fever, zpack should stay in system for 5 days and its unlikely that he has a bacterial infection.  Pt will need to ride it out.  Pt aware

## 2011-10-31 ENCOUNTER — Other Ambulatory Visit: Payer: Self-pay | Admitting: Internal Medicine

## 2011-11-20 ENCOUNTER — Telehealth: Payer: Self-pay | Admitting: Internal Medicine

## 2011-11-20 MED ORDER — SIMVASTATIN 40 MG PO TABS: 40.0000 mg | ORAL_TABLET | Freq: Every day | ORAL | Status: DC

## 2011-11-20 NOTE — Telephone Encounter (Signed)
done

## 2011-11-20 NOTE — Telephone Encounter (Addendum)
Pt called and sched a cpx for 02/15/12 and fasting labs on 02/07/12. Pt is needing refill of simvastatin (ZOCOR) 40 MG tablet  [address] on [address] and [address], to last until his appt date. This was the earliest cpx avail.   Also pt is req a work in appt with Dr Lovell Sheehan in the next few weeks re: diverticulitis pain. Pt refuses to see another doctor.

## 2011-11-20 NOTE — Telephone Encounter (Signed)
Please keep a look out for any openings and schedule if one becomes aval iable

## 2011-11-20 NOTE — Telephone Encounter (Signed)
Called pt and sch for an ov on 12/18/11. Pt said that he will call back periodically to see if there have been any cancellations or pt said if the pain gets too severe, he will call back and sch an ov with any provider but pt said that he does prefer to see pcp.

## 2011-11-21 ENCOUNTER — Ambulatory Visit: Payer: BC Managed Care – PPO | Admitting: Internal Medicine

## 2011-11-26 ENCOUNTER — Ambulatory Visit (INDEPENDENT_AMBULATORY_CARE_PROVIDER_SITE_OTHER): Payer: BC Managed Care – PPO | Admitting: Internal Medicine

## 2011-11-26 ENCOUNTER — Encounter: Payer: Self-pay | Admitting: Internal Medicine

## 2011-11-26 VITALS — BP 130/80 | HR 60 | Temp 98.2°F | Resp 16 | Ht 72.0 in | Wt 218.0 lb

## 2011-11-26 DIAGNOSIS — G4733 Obstructive sleep apnea (adult) (pediatric): Secondary | ICD-10-CM

## 2011-11-26 DIAGNOSIS — K5790 Diverticulosis of intestine, part unspecified, without perforation or abscess without bleeding: Secondary | ICD-10-CM

## 2011-11-26 DIAGNOSIS — I10 Essential (primary) hypertension: Secondary | ICD-10-CM

## 2011-11-26 DIAGNOSIS — E785 Hyperlipidemia, unspecified: Secondary | ICD-10-CM

## 2011-11-26 DIAGNOSIS — K5730 Diverticulosis of large intestine without perforation or abscess without bleeding: Secondary | ICD-10-CM

## 2011-11-26 MED ORDER — SIMVASTATIN 40 MG PO TABS: 40.0000 mg | ORAL_TABLET | Freq: Every day | ORAL | Status: DC

## 2011-11-26 NOTE — Progress Notes (Signed)
Subjective:    Patient ID: Brandon [name], male    DOB: 1951/05/05, 61 y.o.   MRN: [id number]  HPI  To discuss diverticulosis Has note periodic pain in the left lower quadrant that has not progressed to the degree of pain that he has last July. Has noted increased abdominal distension and pain on right and left side. The patient has some hard stools and has been eating salads and fruits which has helped. Eats cerial every AM.    Review of Systems  Constitutional: Negative for fever and fatigue.  HENT: Negative for hearing loss, congestion, neck pain and postnasal drip.   Eyes: Negative for discharge, redness and visual disturbance.  Respiratory: Negative for cough, shortness of breath and wheezing.   Cardiovascular: Negative for leg swelling.  Gastrointestinal: Negative for abdominal pain, constipation and abdominal distention.  Genitourinary: Negative for urgency and frequency.  Musculoskeletal: Negative for joint swelling and arthralgias.  Skin: Negative for color change and rash.  Neurological: Negative for weakness and light-headedness.  Hematological: Negative for adenopathy.  Psychiatric/Behavioral: Negative for behavioral problems.   Past Medical History  Diagnosis Date  . NEOPLASM, MALIGNANT, SKIN, BACK 02/09/2009  . HYPERLIPIDEMIA 02/04/2007  . Chronic prostatitis 12/17/2008  . ERECTILE DYSFUNCTION 02/07/2007  . HYPERTENSION 02/04/2007    History   Social History  . Marital Status: Married    Spouse Name: N/A    Number of Children: N/A  . Years of Education: N/A   Occupational History  . Not on file.   Social History Main Topics  . Smoking status: Never Smoker   . Smokeless tobacco: Not on file  . Alcohol Use:   . Drug Use: No  . Sexually Active: Yes   Other Topics Concern  . Not on file   Social History Narrative  . No narrative on file    No past surgical history on file.  Family History  Problem Relation Age of Onset  . Adopted: Yes  . Cancer  Mother   . Alzheimer's disease Father   . Ambiguous genitalia Brother   . Obesity Daughter   . Asperger's syndrome Son     No Known Allergies  Current Outpatient Prescriptions on File Prior to Visit  Medication Sig Dispense Refill  . aspirin 81 MG tablet Take 81 mg by mouth daily.        . folic acid (FOLVITE) 400 MCG tablet Take 400 mcg by mouth daily.        . metoprolol (TOPROL-XL) 50 MG 24 hr tablet take 1/2 tablet by mouth once daily  30 tablet  5  . Multiple Vitamin (MULTIVITAMIN) capsule Take 1 capsule by mouth daily.        Marland Kitchen PRILOSEC OTC 20 MG tablet take 1 tablet by mouth once daily  42 tablet  11  . simvastatin (ZOCOR) 40 MG tablet take 1 tablet by mouth once daily  30 tablet  5  . DISCONTD: simvastatin (ZOCOR) 40 MG tablet Take 1 tablet (40 mg total) by mouth daily.  30 tablet  3    BP 130/80  Pulse 60  Temp 98.2 F (36.8 C)  Resp 16  Ht 6' (1.829 m)  Wt 218 lb (98.884 kg)  BMI 29.57 kg/m2        Objective:   Physical Exam  Nursing note and vitals reviewed. Constitutional: He appears well-developed and well-nourished.  HENT:  Head: Normocephalic and atraumatic.  Eyes: Conjunctivae are normal. Pupils are equal, round, and reactive to light.  Neck: Normal range of motion. Neck supple.  Cardiovascular: Normal rate and regular rhythm.   Pulmonary/Chest: Effort normal and breath sounds normal.  Abdominal: Soft. Bowel sounds are normal. He exhibits distension. There is tenderness.          Assessment & Plan:  discussed diet for diverticulosis . I have spent more than 30 minutes examining this patient face-to-face of which over half was spent in counseling  Low back pain and exercises given

## 2011-11-26 NOTE — Patient Instructions (Addendum)
Practical paleo  Back Exercises Back exercises help treat and prevent back injuries. The goal of back exercises is to increase the strength of your abdominal and back muscles and the flexibility of your back. These exercises should be started when you no longer have back pain. Back exercises include:  Pelvic Tilt. Lie on your back with your knees bent. Tilt your pelvis until the lower part of your back is against the floor. Hold this position 5 to 10 sec and repeat 5 to 10 times.   Knee to Chest. Pull first 1 knee up against your chest and hold for 20 to 30 seconds, repeat this with the other knee, and then both knees. This may be done with the other leg straight or bent, whichever feels better.   Sit-Ups or Curl-Ups. Bend your knees 90 degrees. Start with tilting your pelvis, and do a partial, slow sit-up, lifting your trunk only 30 to 45 degrees off the floor. Take at least 2 to 3 seconds for each sit-up. Do not do sit-ups with your knees out straight. If partial sit-ups are difficult, simply do the above but with only tightening your abdominal muscles and holding it as directed.   Hip-Lift. Lie on your back with your knees flexed 90 degrees. Push down with your feet and shoulders as you raise your hips a couple inches off the floor; hold for 10 seconds, repeat 5 to 10 times.   Back arches. Lie on your stomach, propping yourself up on bent elbows. Slowly press on your hands, causing an arch in your low back. Repeat 3 to 5 times. Any initial stiffness and discomfort should lessen with repetition over time.   Shoulder-Lifts. Lie face down with arms beside your body. Keep hips and torso pressed to floor as you slowly lift your head and shoulders off the floor.  Do not overdo your exercises, especially in the beginning. Exercises may cause you some mild back discomfort which lasts for a few minutes; however, if the pain is more severe, or lasts for more than 15 minutes, do not continue exercises until  you see your caregiver. Improvement with exercise therapy for back problems is slow.  See your caregivers for assistance with developing a proper back exercise program. Document Released: 07/19/2004 Document Revised: 05/31/2011 Document Reviewed: 06/11/2005 [facility] Patient Information 2012 [place], [place].

## 2011-11-30 ENCOUNTER — Ambulatory Visit: Payer: BC Managed Care – PPO | Admitting: Internal Medicine

## 2011-12-18 ENCOUNTER — Ambulatory Visit: Payer: BC Managed Care – PPO | Admitting: Internal Medicine

## 2012-01-30 ENCOUNTER — Telehealth: Payer: Self-pay | Admitting: Internal Medicine

## 2012-01-30 NOTE — Telephone Encounter (Signed)
Please advise 

## 2012-01-30 NOTE — Telephone Encounter (Signed)
Pt informed ov tomorrow at 11:30- if conditions worsens informed to go to ed

## 2012-01-30 NOTE — Telephone Encounter (Signed)
Caller: Lance/Patient; PCP: Darryll Capers; CB#: [phone]; ; ; Call regarding Dizzyness/ Light Headed/ "hollow Feeling in Chest" Off and On Since Saturday;  01/26/12, Patient reports he started with dizziness/ lightheadedness at wedding. Also, noted symptoms of "hollow feeling in chest".  Nausea also present on 08/03. Dizziness. lightheadedness, and "hollow feeling to chext " have continued to come and go and are present today.  Denies chest pain or SOB. Denies increased or decreased heartrate .   Hx. of HTN. All emergent sxs. ruled out per Dizziness guideline with exception dizziness not responding to home care.  Advised caller needs to be seen in office today. Discussed with patient that Dr. Lovell Sheehan has no avaiable appointments. and this nurse will send a note to his staff to see if he can be worked into sched. Advised to anticipate a call back from office.  Caller verb. understanding and agreement. Caller/Patient can be reached at either home number listed above or via cell - phone # [phone].

## 2012-01-31 ENCOUNTER — Encounter: Payer: Self-pay | Admitting: Internal Medicine

## 2012-01-31 ENCOUNTER — Ambulatory Visit (INDEPENDENT_AMBULATORY_CARE_PROVIDER_SITE_OTHER): Payer: BC Managed Care – PPO | Admitting: Internal Medicine

## 2012-01-31 VITALS — BP 144/100 | HR 72 | Temp 98.6°F | Resp 16 | Ht 72.0 in | Wt 212.0 lb

## 2012-01-31 DIAGNOSIS — I10 Essential (primary) hypertension: Secondary | ICD-10-CM

## 2012-01-31 DIAGNOSIS — R0989 Other specified symptoms and signs involving the circulatory and respiratory systems: Secondary | ICD-10-CM

## 2012-01-31 DIAGNOSIS — R002 Palpitations: Secondary | ICD-10-CM

## 2012-01-31 DIAGNOSIS — R42 Dizziness and giddiness: Secondary | ICD-10-CM

## 2012-01-31 MED ORDER — BISOPROLOL-HYDROCHLOROTHIAZIDE 5-6.25 MG PO TABS: 1.0000 | ORAL_TABLET | Freq: Every day | ORAL | Status: DC

## 2012-01-31 NOTE — Progress Notes (Signed)
Subjective:    Patient ID: Brandon [name], male    DOB: February 19, 1951, 61 y.o.   MRN: [id number]  HPI  Atypical chest pressure Increased risk factors HTN  Noted increased risk factors  Review of Systems  Constitutional: Negative for fever and fatigue.  HENT: Negative for hearing loss, congestion, neck pain and postnasal drip.   Eyes: Negative for discharge, redness and visual disturbance.  Respiratory: Negative for cough, shortness of breath and wheezing.   Cardiovascular: Negative for leg swelling.  Gastrointestinal: Negative for abdominal pain, constipation and abdominal distention.  Genitourinary: Negative for urgency and frequency.  Musculoskeletal: Negative for joint swelling and arthralgias.  Skin: Negative for color change and rash.  Neurological: Negative for weakness and light-headedness.  Hematological: Negative for adenopathy.  Psychiatric/Behavioral: Negative for behavioral problems.    The patient is instructed to continue all medications as prescribed. Schedule followup with check out clerk upon leaving the clinic Past Medical History  Diagnosis Date  . NEOPLASM, MALIGNANT, SKIN, BACK 02/09/2009  . HYPERLIPIDEMIA 02/04/2007  . Chronic prostatitis 12/17/2008  . ERECTILE DYSFUNCTION 02/07/2007  . HYPERTENSION 02/04/2007    History   Social History  . Marital Status: Married    Spouse Name: N/A    Number of Children: N/A  . Years of Education: N/A   Occupational History  . Not on file.   Social History Main Topics  . Smoking status: Never Smoker   . Smokeless tobacco: Not on file  . Alcohol Use:   . Drug Use: No  . Sexually Active: Yes   Other Topics Concern  . Not on file   Social History Narrative  . No narrative on file    No past surgical history on file.  Family History  Problem Relation Age of Onset  . Adopted: Yes  . Cancer Mother   . Alzheimer's disease Father   . Ambiguous genitalia Brother   . Obesity Daughter   . Asperger's syndrome  Son     No Known Allergies  Current Outpatient Prescriptions on File Prior to Visit  Medication Sig Dispense Refill  . aspirin 81 MG tablet Take 81 mg by mouth daily.        . folic acid (FOLVITE) 400 MCG tablet Take 400 mcg by mouth daily.        . Multiple Vitamin (MULTIVITAMIN) capsule Take 1 capsule by mouth daily.        Marland Kitchen PRILOSEC OTC 20 MG tablet take 1 tablet by mouth once daily  42 tablet  11  . simvastatin (ZOCOR) 40 MG tablet Take 1 tablet (40 mg total) by mouth at bedtime.  30 tablet  5  . bisoprolol-hydrochlorothiazide (ZIAC) 5-6.25 MG per tablet Take 1 tablet by mouth daily.  30 tablet  9    BP 144/100  Pulse 72  Temp 98.6 F (37 C)  Resp 16  Ht 6' (1.829 m)  Wt 212 lb (96.163 kg)  BMI 28.75 kg/m2       Objective:   Physical Exam  Nursing note and vitals reviewed. Constitutional: He appears well-developed and well-nourished.  HENT:  Head: Normocephalic and atraumatic.  Eyes: Conjunctivae are normal. Pupils are equal, round, and reactive to light.  Neck: Normal range of motion. Neck supple.  Cardiovascular: Normal rate and regular rhythm.   Pulmonary/Chest: Effort normal and breath sounds normal.  Abdominal: Soft. Bowel sounds are normal.          Assessment & Plan:  HTN elevated blood pressure may be  anxiety but could explain atypical chesst fullness Change to ziac Monitor carotids, heart monitor Follow up in two weeks

## 2012-01-31 NOTE — Patient Instructions (Addendum)
The patient is instructed to continue all medications as prescribed. Schedule followup with check out clerk upon leaving the clinic  

## 2012-02-06 ENCOUNTER — Encounter (INDEPENDENT_AMBULATORY_CARE_PROVIDER_SITE_OTHER): Payer: BC Managed Care – PPO

## 2012-02-06 DIAGNOSIS — R002 Palpitations: Secondary | ICD-10-CM

## 2012-02-06 DIAGNOSIS — R0989 Other specified symptoms and signs involving the circulatory and respiratory systems: Secondary | ICD-10-CM

## 2012-02-06 DIAGNOSIS — R42 Dizziness and giddiness: Secondary | ICD-10-CM

## 2012-02-07 ENCOUNTER — Other Ambulatory Visit (INDEPENDENT_AMBULATORY_CARE_PROVIDER_SITE_OTHER): Payer: BC Managed Care – PPO

## 2012-02-07 DIAGNOSIS — Z0000 Encounter for general adult medical examination without abnormal findings: Secondary | ICD-10-CM

## 2012-02-07 LAB — POCT URINALYSIS DIPSTICK
Bilirubin, UA: NEGATIVE
Glucose, UA: NEGATIVE
Ketones, UA: NEGATIVE
Leukocytes, UA: NEGATIVE
Nitrite, UA: NEGATIVE

## 2012-02-07 LAB — BASIC METABOLIC PANEL
BUN: 14 mg/dL (ref 6–23)
Chloride: 104 mEq/L (ref 96–112)
GFR: 94.37 mL/min (ref >60.00)
Glucose, Bld: 101 mg/dL — ABNORMAL HIGH (ref 70–99)
Potassium: 4 mEq/L (ref 3.5–5.1)
Sodium: 139 mEq/L (ref 135–145)

## 2012-02-07 LAB — CBC WITH DIFFERENTIAL/PLATELET
Basophils Absolute: 0 10*3/uL (ref 0.0–0.1)
Eosinophils Relative: 1.5 % (ref 0.0–5.0)
HCT: 43.1 % (ref 39.0–52.0)
Hemoglobin: 14.4 g/dL (ref 13.0–17.0)
Lymphs Abs: 1.5 10*3/uL (ref 0.7–4.0)
MCV: 94.9 fl (ref 78.0–100.0)
Monocytes Absolute: 0.4 10*3/uL (ref 0.1–1.0)
Monocytes Relative: 8.3 % (ref 3.0–12.0)
Neutro Abs: 3 10*3/uL (ref 1.4–7.7)
Platelets: 198 10*3/uL (ref 150.0–400.0)
RDW: 14.2 % (ref 11.5–14.6)

## 2012-02-07 LAB — HEPATIC FUNCTION PANEL
ALT: 22 U/L (ref 0–53)
AST: 20 U/L (ref 0–37)
Albumin: 3.8 g/dL (ref 3.5–5.2)
Total Bilirubin: 0.7 mg/dL (ref 0.3–1.2)

## 2012-02-07 LAB — TSH: TSH: 2.68 u[IU]/mL (ref 0.35–5.50)

## 2012-02-07 LAB — LIPID PANEL: Cholesterol: 134 mg/dL (ref 0–200)

## 2012-02-07 LAB — PSA: PSA: 2.1 ng/mL (ref 0.10–4.00)

## 2012-02-15 ENCOUNTER — Ambulatory Visit (INDEPENDENT_AMBULATORY_CARE_PROVIDER_SITE_OTHER): Payer: BC Managed Care – PPO | Admitting: Internal Medicine

## 2012-02-15 ENCOUNTER — Encounter: Payer: Self-pay | Admitting: Internal Medicine

## 2012-02-15 VITALS — BP 120/78 | HR 64 | Temp 98.2°F | Resp 16 | Ht 72.0 in | Wt 208.0 lb

## 2012-02-15 DIAGNOSIS — I10 Essential (primary) hypertension: Secondary | ICD-10-CM

## 2012-02-15 DIAGNOSIS — Z0000 Encounter for general adult medical examination without abnormal findings: Secondary | ICD-10-CM

## 2012-02-15 DIAGNOSIS — R42 Dizziness and giddiness: Secondary | ICD-10-CM

## 2012-02-15 DIAGNOSIS — Z2911 Encounter for prophylactic immunotherapy for respiratory syncytial virus (RSV): Secondary | ICD-10-CM

## 2012-02-15 NOTE — Addendum Note (Signed)
Addended by: Willy Eddy on: 02/15/2012 02:01 PM   Modules accepted: Orders

## 2012-02-15 NOTE — Progress Notes (Signed)
Subjective:    Patient ID: Brandon [name], male    DOB: 12-25-1950, 61 y.o.   MRN: [id number]  HPI Seen for in the setting of new onset  HTN  Carotids were clean Dizziness is largely resolved with the use of a beta blocker   Review of Systems  Constitutional: Negative for fever and fatigue.  HENT: Negative for hearing loss, congestion, neck pain and postnasal drip.   Eyes: Negative for discharge, redness and visual disturbance.  Respiratory: Negative for cough, shortness of breath and wheezing.   Cardiovascular: Negative for leg swelling.  Gastrointestinal: Negative for abdominal pain, constipation and abdominal distention.  Genitourinary: Negative for urgency and frequency.  Musculoskeletal: Negative for joint swelling and arthralgias.  Skin: Negative for color change and rash.  Neurological: Negative for weakness and light-headedness.  Hematological: Negative for adenopathy.  Psychiatric/Behavioral: Negative for behavioral problems.   Past Medical History  Diagnosis Date  . NEOPLASM, MALIGNANT, SKIN, BACK 02/09/2009  . HYPERLIPIDEMIA 02/04/2007  . Chronic prostatitis 12/17/2008  . ERECTILE DYSFUNCTION 02/07/2007  . HYPERTENSION 02/04/2007    History   Social History  . Marital Status: Married    Spouse Name: N/A    Number of Children: N/A  . Years of Education: N/A   Occupational History  . Not on file.   Social History Main Topics  . Smoking status: Never Smoker   . Smokeless tobacco: Not on file  . Alcohol Use:   . Drug Use: No  . Sexually Active: Yes   Other Topics Concern  . Not on file   Social History Narrative  . No narrative on file    No past surgical history on file.  Family History  Problem Relation Age of Onset  . Adopted: Yes  . Cancer Mother   . Alzheimer's disease Father   . Ambiguous genitalia Brother   . Obesity Daughter   . Asperger's syndrome Son     No Known Allergies  Current Outpatient Prescriptions on File Prior to Visit    Medication Sig Dispense Refill  . aspirin 81 MG tablet Take 81 mg by mouth daily.        . bisoprolol-hydrochlorothiazide (ZIAC) 5-6.25 MG per tablet Take 1 tablet by mouth daily.  30 tablet  9  . folic acid (FOLVITE) 400 MCG tablet Take 400 mcg by mouth daily.        . Multiple Vitamin (MULTIVITAMIN) capsule Take 1 capsule by mouth daily.        Marland Kitchen PRILOSEC OTC 20 MG tablet take 1 tablet by mouth once daily  42 tablet  11  . simvastatin (ZOCOR) 40 MG tablet Take 1 tablet (40 mg total) by mouth at bedtime.  30 tablet  5    BP 120/78  Pulse 64  Temp 98.2 F (36.8 C)  Resp 16  Ht 6' (1.829 m)  Wt 208 lb (94.348 kg)  BMI 28.21 kg/m2       Objective:   Physical Exam  Nursing note and vitals reviewed. Constitutional: He is oriented to person, place, and time. He appears well-developed and well-nourished.  HENT:  Head: Normocephalic and atraumatic.  Eyes: Conjunctivae are normal. Pupils are equal, round, and reactive to light.  Neck: Normal range of motion. Neck supple.  Cardiovascular: Normal rate and regular rhythm.   Pulmonary/Chest: Effort normal and breath sounds normal.  Abdominal: Soft. Bowel sounds are normal.  Genitourinary: Rectum normal and prostate normal.  Musculoskeletal: Normal range of motion.  Neurological: He is alert  and oriented to person, place, and time.  Skin: Skin is warm and dry.  Psychiatric: He has a normal mood and affect. His behavior is normal.          Assessment & Plan:   Patient presents for yearly preventative medicine examination.   all immunizations and health maintenance protocols were reviewed with the patient and they are up to date with these protocols.   screening laboratory values were reviewed with the patient including screening of hyperlipidemia PSA renal function and hepatic function.   There medications past medical history social history problem list and allergies were reviewed in detail.   Goals were established with  regard to weight loss exercise diet in compliance with medications   Stable blood pressure  Resolved dizziness mild tinnitus due to wax wax removal discussed

## 2012-08-04 ENCOUNTER — Telehealth: Payer: Self-pay | Admitting: Internal Medicine

## 2012-08-04 MED ORDER — ATOVAQUONE-PROGUANIL HCL 250-100 MG PO TABS: ORAL_TABLET | ORAL | Status: DC

## 2012-08-04 NOTE — Telephone Encounter (Signed)
malarone sent in and others will be ordered at time of cpx

## 2012-08-04 NOTE — Telephone Encounter (Signed)
Pt [place] to go [place] of country. ([place]) Malerone  29-30 pills ciprofloxacin (CIPRO) 500 MG tablet [facility]: [id number] [address]  Pt would like written script for other meds in case he loses them.  Pt has appt 2/21 but did not want to wait until last min. for scripts

## 2012-08-15 ENCOUNTER — Encounter: Payer: Self-pay | Admitting: Internal Medicine

## 2012-08-15 ENCOUNTER — Ambulatory Visit (INDEPENDENT_AMBULATORY_CARE_PROVIDER_SITE_OTHER): Payer: BC Managed Care – PPO | Admitting: Internal Medicine

## 2012-08-15 ENCOUNTER — Other Ambulatory Visit: Payer: Self-pay | Admitting: *Deleted

## 2012-08-15 VITALS — BP 136/80 | HR 72 | Temp 98.3°F | Resp 16 | Ht 72.0 in | Wt 220.0 lb

## 2012-08-15 DIAGNOSIS — E785 Hyperlipidemia, unspecified: Secondary | ICD-10-CM

## 2012-08-15 DIAGNOSIS — Z0000 Encounter for general adult medical examination without abnormal findings: Secondary | ICD-10-CM

## 2012-08-15 DIAGNOSIS — I10 Essential (primary) hypertension: Secondary | ICD-10-CM

## 2012-08-15 MED ORDER — FOLIC ACID 400 MCG PO TABS: 400.0000 ug | ORAL_TABLET | Freq: Every day | ORAL | Status: DC

## 2012-08-15 MED ORDER — OMEPRAZOLE MAGNESIUM 20 MG PO TBEC: 20.0000 mg | DELAYED_RELEASE_TABLET | Freq: Every day | ORAL | Status: DC

## 2012-08-15 MED ORDER — BISOPROLOL-HYDROCHLOROTHIAZIDE 5-6.25 MG PO TABS: 1.0000 | ORAL_TABLET | Freq: Every day | ORAL | Status: DC

## 2012-08-15 MED ORDER — SIMVASTATIN 40 MG PO TABS: 40.0000 mg | ORAL_TABLET | Freq: Every day | ORAL | Status: DC

## 2012-08-15 MED ORDER — CIPROFLOXACIN HCL 500 MG PO TABS: 500.0000 mg | ORAL_TABLET | Freq: Two times a day (BID) | ORAL | Status: AC

## 2012-08-15 NOTE — Patient Instructions (Signed)
If you loose 10 pounds cut the blood pressure medication in 1/2 If you loose 20 pounds try going off the zocor

## 2012-08-15 NOTE — Progress Notes (Signed)
  Subjective:    Patient ID: Brandon [name], male    DOB: October 11, 1950, 63 y.o.   MRN: [id number]  HPI Follow up blood pressure medications Tinnitus that has worsened with the ziac Right sided cramps with pressure No pain with exercise or standing Liver functions normal    Review of Systems  Constitutional: Negative for fever and fatigue.  HENT: Negative for hearing loss, congestion, neck pain and postnasal drip.   Eyes: Negative for discharge, redness and visual disturbance.  Respiratory: Negative for cough, shortness of breath and wheezing.   Cardiovascular: Negative for leg swelling.  Gastrointestinal: Negative for abdominal pain, constipation and abdominal distention.  Genitourinary: Negative for urgency and frequency.  Musculoskeletal: Negative for joint swelling and arthralgias.  Skin: Negative for color change and rash.  Neurological: Negative for weakness and light-headedness.  Hematological: Negative for adenopathy.  Psychiatric/Behavioral: Negative for behavioral problems.   Past Medical History  Diagnosis Date  . NEOPLASM, MALIGNANT, SKIN, BACK 02/09/2009  . HYPERLIPIDEMIA 02/04/2007  . Chronic prostatitis 12/17/2008  . ERECTILE DYSFUNCTION 02/07/2007  . HYPERTENSION 02/04/2007    History   Social History  . Marital Status: Married    Spouse Name: N/A    Number of Children: N/A  . Years of Education: N/A   Occupational History  . Not on file.   Social History Main Topics  . Smoking status: Never Smoker   . Smokeless tobacco: Not on file  . Alcohol Use:   . Drug Use: No  . Sexually Active: Yes   Other Topics Concern  . Not on file   Social History Narrative  . No narrative on file    History reviewed. No pertinent past surgical history.  Family History  Problem Relation Age of Onset  . Adopted: Yes  . Cancer Mother   . Alzheimer's disease Father   . Ambiguous genitalia Brother   . Obesity Daughter   . Asperger's syndrome Son     No Known  Allergies  Current Outpatient Prescriptions on File Prior to Visit  Medication Sig Dispense Refill  . aspirin 81 MG tablet Take 81 mg by mouth daily.        Marland Kitchen atovaquone-proguanil (MALARONE) 250-100 MG TABS Take 1 a day as directed  29 tablet  0  . Multiple Vitamin (MULTIVITAMIN) capsule Take 1 capsule by mouth daily.         No current facility-administered medications on file prior to visit.    BP 136/80  Pulse 72  Temp(Src) 98.3 F (36.8 C)  Resp 16  Ht 6' (1.829 m)  Wt 220 lb (99.791 kg)  BMI 29.83 kg/m2       Objective:   Physical Exam  Vitals reviewed. Constitutional: He appears well-developed and well-nourished.  HENT:  Head: Normocephalic and atraumatic.  Eyes: Conjunctivae are normal. Pupils are equal, round, and reactive to light.  Neck: Normal range of motion. Neck supple.  Cardiovascular: Normal rate and regular rhythm.   Pulmonary/Chest: Effort normal and breath sounds normal.  Abdominal: Soft. Bowel sounds are normal.          Assessment & Plan:  Back pain radiating to flank Need for back exercises ED Needs testosterone PSA increased with prior treatment Discussion of lipids

## 2012-09-19 ENCOUNTER — Other Ambulatory Visit: Payer: Self-pay | Admitting: Internal Medicine

## 2012-11-18 ENCOUNTER — Other Ambulatory Visit: Payer: Self-pay | Admitting: Internal Medicine

## 2012-12-16 ENCOUNTER — Telehealth: Payer: Self-pay | Admitting: Internal Medicine

## 2012-12-16 MED ORDER — CIPROFLOXACIN HCL 500 MG PO TABS: 500.0000 mg | ORAL_TABLET | Freq: Two times a day (BID) | ORAL | Status: DC

## 2012-12-16 NOTE — Telephone Encounter (Signed)
done

## 2012-12-16 NOTE — Telephone Encounter (Signed)
Pt is traveling to [place] this weekend on vacation and would like a RX for CIPRO to take. Pharm: [facility]

## 2013-01-12 ENCOUNTER — Other Ambulatory Visit: Payer: Self-pay | Admitting: Internal Medicine

## 2013-02-11 ENCOUNTER — Other Ambulatory Visit (INDEPENDENT_AMBULATORY_CARE_PROVIDER_SITE_OTHER): Payer: BC Managed Care – PPO

## 2013-02-11 DIAGNOSIS — Z0000 Encounter for general adult medical examination without abnormal findings: Secondary | ICD-10-CM

## 2013-02-11 LAB — HEPATIC FUNCTION PANEL
AST: 21 U/L (ref 0–37)
Albumin: 3.8 g/dL (ref 3.5–5.2)
Alkaline Phosphatase: 41 U/L (ref 39–117)
Bilirubin, Direct: 0.1 mg/dL (ref 0.0–0.3)
Total Protein: 6.5 g/dL (ref 6.0–8.3)

## 2013-02-11 LAB — POCT URINALYSIS DIPSTICK
Bilirubin, UA: NEGATIVE
Ketones, UA: NEGATIVE
Leukocytes, UA: NEGATIVE
Protein, UA: NEGATIVE
Spec Grav, UA: 1.015
pH, UA: 7.5

## 2013-02-11 LAB — LIPID PANEL
Cholesterol: 150 mg/dL (ref 0–200)
HDL: 40.4 mg/dL (ref >39.00)
LDL Cholesterol: 74 mg/dL (ref 0–99)
VLDL: 35.2 mg/dL (ref 0.0–40.0)

## 2013-02-11 LAB — BASIC METABOLIC PANEL
BUN: 8 mg/dL (ref 6–23)
CO2: 28 mEq/L (ref 19–32)
Chloride: 107 mEq/L (ref 96–112)
Glucose, Bld: 99 mg/dL (ref 70–99)
Potassium: 4.1 mEq/L (ref 3.5–5.1)

## 2013-02-11 LAB — CBC WITH DIFFERENTIAL/PLATELET
Basophils Relative: 0.3 % (ref 0.0–3.0)
Lymphocytes Relative: 45.1 % (ref 12.0–46.0)
Monocytes Relative: 7.7 % (ref 3.0–12.0)
Neutro Abs: 1.9 10*3/uL (ref 1.4–7.7)
Neutrophils Relative %: 45.3 % (ref 43.0–77.0)

## 2013-02-18 ENCOUNTER — Encounter: Payer: Self-pay | Admitting: Internal Medicine

## 2013-02-18 ENCOUNTER — Ambulatory Visit (INDEPENDENT_AMBULATORY_CARE_PROVIDER_SITE_OTHER): Payer: BC Managed Care – PPO | Admitting: Internal Medicine

## 2013-02-18 VITALS — BP 126/82 | HR 72 | Temp 98.0°F | Resp 16 | Ht 72.0 in | Wt 225.0 lb

## 2013-02-18 DIAGNOSIS — E785 Hyperlipidemia, unspecified: Secondary | ICD-10-CM

## 2013-02-18 DIAGNOSIS — R972 Elevated prostate specific antigen [PSA]: Secondary | ICD-10-CM

## 2013-02-18 DIAGNOSIS — Z23 Encounter for immunization: Secondary | ICD-10-CM

## 2013-02-18 DIAGNOSIS — I10 Essential (primary) hypertension: Secondary | ICD-10-CM

## 2013-02-18 DIAGNOSIS — Z0000 Encounter for general adult medical examination without abnormal findings: Secondary | ICD-10-CM

## 2013-02-18 MED ORDER — ATENOLOL 50 MG PO TABS: 50.0000 mg | ORAL_TABLET | Freq: Every day | ORAL | Status: DC

## 2013-02-18 NOTE — Progress Notes (Signed)
Subjective:     Patient ID: Brandon [name], male   DOB: 06-17-1951, 62 y.o.   MRN: [id number]  HPIcpx   Review of Systems  Constitutional: Negative for fever and fatigue.  HENT: Positive for tinnitus. Negative for hearing loss, congestion, neck pain and postnasal drip.   Eyes: Negative for discharge, redness and visual disturbance.  Respiratory: Negative for cough, shortness of breath and wheezing.   Cardiovascular: Negative for leg swelling.  Gastrointestinal: Negative for abdominal pain, constipation and abdominal distention.  Genitourinary: Negative for urgency and frequency.  Musculoskeletal: Negative for joint swelling and arthralgias.  Skin: Negative for color change and rash.  Neurological: Positive for weakness and light-headedness.  Hematological: Negative for adenopathy.  Psychiatric/Behavioral: Negative for behavioral problems.   Past Medical History  Diagnosis Date  . NEOPLASM, MALIGNANT, SKIN, BACK 02/09/2009  . HYPERLIPIDEMIA 02/04/2007  . Chronic prostatitis 12/17/2008  . ERECTILE DYSFUNCTION 02/07/2007  . HYPERTENSION 02/04/2007    History   Social History  . Marital Status: Married    Spouse Name: N/A    Number of Children: N/A  . Years of Education: N/A   Occupational History  . Not on file.   Social History Main Topics  . Smoking status: Never Smoker   . Smokeless tobacco: Not on file  . Alcohol Use:   . Drug Use: No  . Sexual Activity: Yes   Other Topics Concern  . Not on file   Social History Narrative  . No narrative on file    History reviewed. No pertinent past surgical history.  Family History  Problem Relation Age of Onset  . Adopted: Yes  . Cancer Mother   . Alzheimer's disease Father   . Ambiguous genitalia Brother   . Obesity Daughter   . Asperger's syndrome Son     No Known Allergies  Current Outpatient Prescriptions on File Prior to Visit  Medication Sig Dispense Refill  . aspirin 81 MG tablet Take 81 mg by mouth daily.         . bisoprolol-hydrochlorothiazide (ZIAC) 5-6.25 MG per tablet take 1 tablet by mouth daily  30 tablet  9  . folic acid (FOLVITE) 400 MCG tablet Take 1 tablet (400 mcg total) by mouth daily.  30 tablet  1  . Multiple Vitamin (MULTIVITAMIN) capsule Take 1 capsule by mouth daily.        Marland Kitchen PRILOSEC OTC 20 MG tablet take 1 tablet by mouth once daily  42 tablet  11  . simvastatin (ZOCOR) 40 MG tablet take 1 tablet by mouth once daily  90 tablet  3   No current facility-administered medications on file prior to visit.    BP 126/82  Pulse 72  Temp(Src) 98 F (36.7 C)  Resp 16  Ht 6' (1.829 m)  Wt 225 lb (102.059 kg)  BMI 30.51 kg/m2       Objective:   Physical Exam  Constitutional: He is oriented to person, place, and time. He appears well-developed and well-nourished.  HENT:  Head: Normocephalic and atraumatic.  Eyes: Conjunctivae are normal. Pupils are equal, round, and reactive to light.  Neck: Normal range of motion. Neck supple.  Cardiovascular: Normal rate and regular rhythm.   Pulmonary/Chest: Effort normal and breath sounds normal.  Abdominal: Soft. Bowel sounds are normal.  Genitourinary: Guaiac positive stool.  Neurological: He is alert and oriented to person, place, and time.  Skin: Skin is warm and dry.       Assessment:  Healthy but oveweight Tinnitus Positional  Vertigo htn controlled but side effects for medicaitons    Plan:      Patient presents for yearly preventative medicine examination.   all immunizations and health maintenance protocols were reviewed with the patient and they are up to date with these protocols.   screening laboratory values were reviewed with the patient including screening of hyperlipidemia PSA renal function and hepatic function.   There medications past medical history social history problem list and allergies were reviewed in detail.   Goals were established with regard to weight loss exercise diet in compliance with  medications

## 2013-08-17 ENCOUNTER — Other Ambulatory Visit: Payer: Self-pay | Admitting: *Deleted

## 2013-08-17 ENCOUNTER — Telehealth: Payer: Self-pay | Admitting: Internal Medicine

## 2013-08-17 DIAGNOSIS — I10 Essential (primary) hypertension: Secondary | ICD-10-CM

## 2013-08-17 MED ORDER — ATENOLOL 50 MG PO TABS: 50.0000 mg | ORAL_TABLET | Freq: Every day | ORAL | Status: DC

## 2013-08-17 NOTE — Telephone Encounter (Signed)
done

## 2013-08-17 NOTE — Telephone Encounter (Signed)
Pt had to resc fup for fri. This was fup on new med pt began.atenolol (TENORMIN) 50 MG tablet Pt needs refill , but needs changed to 90 day. [facility] [address]  pt resc appt to  5/11. Does pt need to come in sooner for a work in or another provider? Pt states this med seems to be working ok.

## 2013-08-21 ENCOUNTER — Ambulatory Visit: Payer: BC Managed Care – PPO | Admitting: Internal Medicine

## 2013-09-15 ENCOUNTER — Encounter: Payer: Self-pay | Admitting: Internal Medicine

## 2013-11-02 ENCOUNTER — Ambulatory Visit (INDEPENDENT_AMBULATORY_CARE_PROVIDER_SITE_OTHER): Payer: BC Managed Care – PPO | Admitting: Internal Medicine

## 2013-11-02 ENCOUNTER — Encounter: Payer: Self-pay | Admitting: Internal Medicine

## 2013-11-02 VITALS — BP 110/80 | HR 60 | Temp 97.9°F | Wt 216.0 lb

## 2013-11-02 DIAGNOSIS — I10 Essential (primary) hypertension: Secondary | ICD-10-CM

## 2013-11-02 DIAGNOSIS — N529 Male erectile dysfunction, unspecified: Secondary | ICD-10-CM

## 2013-11-02 MED ORDER — OMEPRAZOLE MAGNESIUM 20 MG PO TBEC: DELAYED_RELEASE_TABLET | ORAL | Status: DC

## 2013-11-02 MED ORDER — SILDENAFIL CITRATE 20 MG PO TABS: 40.0000 mg | ORAL_TABLET | Freq: Every day | ORAL | Status: DC | PRN

## 2013-11-02 MED ORDER — SIMVASTATIN 40 MG PO TABS: ORAL_TABLET | ORAL | Status: DC

## 2013-11-02 NOTE — Patient Instructions (Signed)
The patient is instructed to continue all medications as prescribed. Schedule followup with check out clerk upon leaving the clinic  

## 2013-11-02 NOTE — Progress Notes (Signed)
Subjective:    Patient ID: Brandon [name], male    DOB: 1951-06-19, 63 y.o.   MRN: [id number]  HPI Follow up for ED, HTN and lipids Hx of elevated PSA  Follow up of change of blood pressure  The atenolol has helped the dizzy spell and the blood pressure is stable Dry mouth?  TIA hx about 20 years Keeping the lipids and HTN in control   Review of Systems  Constitutional: Negative for fever and fatigue.  HENT: Negative for congestion, hearing loss and postnasal drip.   Eyes: Negative for discharge, redness and visual disturbance.  Respiratory: Negative for cough, shortness of breath and wheezing.   Cardiovascular: Negative for leg swelling.  Gastrointestinal: Negative for abdominal pain, constipation and abdominal distention.  Genitourinary: Negative for urgency and frequency.  Musculoskeletal: Negative for arthralgias, joint swelling and neck pain.  Skin: Negative for color change and rash.  Neurological: Negative for weakness and light-headedness.  Hematological: Negative for adenopathy.  Psychiatric/Behavioral: Negative for behavioral problems.       Past Medical History  Diagnosis Date  . NEOPLASM, MALIGNANT, SKIN, BACK 02/09/2009  . HYPERLIPIDEMIA 02/04/2007  . Chronic prostatitis 12/17/2008  . ERECTILE DYSFUNCTION 02/07/2007  . HYPERTENSION 02/04/2007    History   Social History  . Marital Status: Married    Spouse Name: N/A    Number of Children: N/A  . Years of Education: N/A   Occupational History  . Not on file.   Social History Main Topics  . Smoking status: Never Smoker   . Smokeless tobacco: Not on file  . Alcohol Use:   . Drug Use: No  . Sexual Activity: Yes   Other Topics Concern  . Not on file   Social History Narrative  . No narrative on file    No past surgical history on file.  Family History  Problem Relation Age of Onset  . Adopted: Yes  . Cancer Mother   . Alzheimer's disease Father   . Ambiguous genitalia Brother   . Obesity  Daughter   . Asperger's syndrome Son     No Known Allergies  Current Outpatient Prescriptions on File Prior to Visit  Medication Sig Dispense Refill  . aspirin 81 MG tablet Take 81 mg by mouth daily.        Marland Kitchen atenolol (TENORMIN) 50 MG tablet Take 1 tablet (50 mg total) by mouth daily.  90 tablet  3  . folic acid (FOLVITE) 073 MCG tablet Take 1 tablet (400 mcg total) by mouth daily.  30 tablet  1  . Multiple Vitamin (MULTIVITAMIN) capsule Take 1 capsule by mouth daily.         No current facility-administered medications on file prior to visit.    BP 110/80  Pulse 60  Temp(Src) 97.9 F (36.6 C) (Oral)  Wt 216 lb (97.977 kg)    Objective:   Physical Exam  Constitutional: He appears well-developed and well-nourished.  HENT:  Head: Normocephalic and atraumatic.  Eyes: Conjunctivae are normal. Pupils are equal, round, and reactive to light.  Neck: Normal range of motion. Neck supple.  Cardiovascular: Normal rate and regular rhythm.   Pulmonary/Chest: Effort normal and breath sounds normal.  Abdominal: Soft. Bowel sounds are normal.          Assessment & Plan:  Dry mouth  Could this bee the beta blocker.  Paronychia  Under treatment  with bactrim and keflex Continued treatment  PSA monitoring  Dry mouth as a part of  lipids  Why has parkinson

## 2013-11-02 NOTE — Progress Notes (Signed)
Pre visit review using our clinic review tool, if applicable. No additional management support is needed unless otherwise documented below in the visit note. 

## 2013-11-09 ENCOUNTER — Telehealth: Payer: Self-pay | Admitting: Internal Medicine

## 2013-11-09 DIAGNOSIS — N529 Male erectile dysfunction, unspecified: Secondary | ICD-10-CM

## 2013-11-09 NOTE — Telephone Encounter (Signed)
PA sildenafil denied, not covered for ED.

## 2013-11-12 NOTE — Telephone Encounter (Signed)
Written rx tomorrow afternoon and take it to [facility] in [facility]

## 2013-11-13 MED ORDER — SILDENAFIL CITRATE 20 MG PO TABS: 40.0000 mg | ORAL_TABLET | Freq: Every day | ORAL | Status: DC | PRN

## 2013-11-13 NOTE — Telephone Encounter (Signed)
Rx printed

## 2013-11-17 NOTE — Telephone Encounter (Signed)
Called and spoke with pt and pt is aware.  

## 2014-02-02 ENCOUNTER — Other Ambulatory Visit: Payer: Self-pay | Admitting: Internal Medicine

## 2014-02-02 ENCOUNTER — Telehealth: Payer: Self-pay | Admitting: Internal Medicine

## 2014-02-02 MED ORDER — CIPROFLOXACIN HCL 500 MG PO TABS: 500.0000 mg | ORAL_TABLET | Freq: Two times a day (BID) | ORAL | Status: DC

## 2014-02-02 MED ORDER — ATOVAQUONE-PROGUANIL HCL 250-100 MG PO TABS: ORAL_TABLET | ORAL | Status: DC

## 2014-02-02 NOTE — Telephone Encounter (Signed)
Per Dr Arnoldo Morale malarone 250 mg take 1-2 days prior to leaving then take once daily continue for 7 additional days of getting back.  #33/no refills and call in cipro 500 mg bid x7 days #14/0 refills.  rx's were sent in electronically to [facility], pt aware

## 2014-02-02 NOTE — Telephone Encounter (Signed)
Pt is going to [place] on 03-03-14 and staying 24 days. Pt needs generic malaria #33  And cipro [facility]. Pt has appt in nov 2015 to get est with dr Retail banker. Cindy please call pt once rxs has been call into pharm

## 2014-05-03 ENCOUNTER — Ambulatory Visit (INDEPENDENT_AMBULATORY_CARE_PROVIDER_SITE_OTHER): Payer: BC Managed Care – PPO | Admitting: Family Medicine

## 2014-05-03 ENCOUNTER — Ambulatory Visit (INDEPENDENT_AMBULATORY_CARE_PROVIDER_SITE_OTHER): Payer: BC Managed Care – PPO

## 2014-05-03 ENCOUNTER — Encounter: Payer: Self-pay | Admitting: Family Medicine

## 2014-05-03 VITALS — BP 140/84 | Temp 98.0°F | Wt 233.0 lb

## 2014-05-03 DIAGNOSIS — I10 Essential (primary) hypertension: Secondary | ICD-10-CM

## 2014-05-03 DIAGNOSIS — E785 Hyperlipidemia, unspecified: Secondary | ICD-10-CM

## 2014-05-03 DIAGNOSIS — Z23 Encounter for immunization: Secondary | ICD-10-CM

## 2014-05-03 DIAGNOSIS — K219 Gastro-esophageal reflux disease without esophagitis: Secondary | ICD-10-CM | POA: Insufficient documentation

## 2014-05-03 NOTE — Progress Notes (Signed)
Brandon Reddish, MD Phone: [phone]  Subjective:  Patient presents today to establish care with me as their new primary care provider. Patient was formerly a patient of Dr. Arnoldo Morale. Chief complaint-noted.   Hypertension-mild poor control (although goal SBP could be 150 given age)  BP Readings from Last 3 Encounters:  05/03/14 140/84  11/02/13 110/80  02/18/13 126/82  atenolol 50mg  Home BP monitoring-no Compliant with medications-yes without side effects ROS-Denies any CP, HA, SOB, blurry vision..  Hyperlipidemia-good control on last labs.   Lab Results  Component Value Date   LDLCALC 74 02/11/2013  On statin: simvastatin Regular exercise: no, advised ROS- no chest pain or shortness of breath. No myalgias   The following were reviewed and entered/updated in epic: Past Medical History  Diagnosis Date  . NEOPLASM, MALIGNANT, SKIN, BACK 02/09/2009  . HYPERLIPIDEMIA 02/04/2007  . Chronic prostatitis 12/17/2008  . ERECTILE DYSFUNCTION 02/07/2007  . HYPERTENSION 02/04/2007  . Diverticulosis 01/16/2011    History of diverticulitis    Patient Active Problem List   Diagnosis Date Noted  . Hyperlipidemia 02/04/2007    Priority: Medium  . Essential hypertension 02/04/2007    Priority: Medium  . Cervical disc disorder with radiculopathy of cervical region 07/14/2010    Priority: Low  . MIXED HEARING LOSS BILATERAL 07/14/2010    Priority: Low  . ERECTILE DYSFUNCTION 02/07/2007    Priority: Low  . GERD (gastroesophageal reflux disease) 05/03/2014   Past Surgical History  Procedure Laterality Date  . None      Family History  Problem Relation Age of Onset  . Adopted: Yes  . Breast cancer Mother   . Alzheimer's disease Father     late 22s. mid 58s  . Asperger's syndrome Son   . CVA Mother     quit taking HTN meds    Medications- reviewed and updated Current Outpatient Prescriptions  Medication Sig Dispense Refill  . aspirin 81 MG tablet Take 81 mg by mouth daily.       Marland Kitchen atenolol (TENORMIN) 50 MG tablet Take 1 tablet (50 mg total) by mouth daily. 90 tablet 3  . atovaquone-proguanil (MALARONE) 250-100 MG TABS Take 1 tablet 1-2 days prior to leaving then once daily during stay then continue 7 days after returning 33 tablet 0  . folic acid (FOLVITE) 790 MCG tablet Take 1 tablet (400 mcg total) by mouth daily. 30 tablet 1  . Multiple Vitamin (MULTIVITAMIN) capsule Take 1 capsule by mouth daily.      Marland Kitchen omeprazole (PRILOSEC OTC) 20 MG tablet take 1 tablet by mouth once daily 90 tablet 3  . sildenafil (REVATIO) 20 MG tablet Take 2 tablets (40 mg total) by mouth daily as needed. 60 tablet 5  . simvastatin (ZOCOR) 40 MG tablet TAKE 1 TABLET BY MOUTH ONCE DAILY 90 tablet 2   No current facility-administered medications for this visit.    Allergies-reviewed and updated No Known Allergies  History   Social History  . Marital Status: Married    Spouse Name: N/A    Number of Children: N/A  . Years of Education: N/A   Social History Main Topics  . Smoking status: Never Smoker   . Smokeless tobacco: None  . Alcohol Use: 1.8 oz/week    3 Not specified per week  . Drug Use: No  . Sexual Activity: Yes   Other Topics Concern  . None   Social History Narrative   Married 1973. Wife has Parkinsons. 2 daughters, 1 son (middle). 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 CV: RRR no murmurs rubs or gallops Lungs: CTAB no crackles, wheeze, rhonchi Abdomen: soft/nontender/nondistended/normal bowel sounds.  Ext: no edema Skin: warm, dry, no rash Neuro: grossly normal, moves all extremities, PERRLA   Assessment/Plan:  Hyperlipidemia Well controlled at last visit. Continue simvastatin. Check  lipids before next visit in 3 months.   Essential hypertension I would like systolic blood pressure less than 140. Continue atenolol. Systolic has trended up as weight has trended up-advised patient need to restart regular exercise. Follow up 3 months.    Orders Placed This Encounter  Procedures  . Pneumococcal polysaccharide vaccine 23-valent greater than or equal to 2yo subcutaneous/IM   Patient to follow up within 3 months for fasting labs before physical. I will need to enter these if he waits until after his birthday potentially.

## 2014-05-03 NOTE — Addendum Note (Signed)
Addended by: [name] on: 05/03/2014 09:55 PM   Modules accepted: Level of Service

## 2014-05-03 NOTE — Assessment & Plan Note (Signed)
Well controlled at last visit. Continue simvastatin. Check lipids before next visit in 3 months.

## 2014-05-03 NOTE — Patient Instructions (Signed)
Let's see each other for physical in 3 months or so. It is ok for them to schedule this as the 3rd or 4th 30 minute visit per half day.   Try to work on getting weight back down. I would encourage regular exercise 150 minutes a week.   My 5 to Fitness!  5: fruits and vegetables per day (work on 9 per day if you are at 5) 4: exercise 4-5 times per week for at least 30 minutes (walking counts!) 3: meals per day (don't skip breakfast!) 2: habits to quit -smoking -excess alcohol use (men >2 beer/day; women >1beer/day) 1: sweet per day (2 cookies, 1 small cup of ice cream, 12 oz soda)  These are general tips for healthy living. Try to start with 1 or 2 habit TODAY and make it a part of your life for several months.  Once you have 1 or 2 habits down for several months, try to begin working on your next healthy habit. With every single step you take, you will be leading a healthier lifestyle!

## 2014-05-03 NOTE — Assessment & Plan Note (Signed)
I would like systolic blood pressure less than 140. Continue atenolol. Systolic has trended up as weight has trended up-advised patient need to restart regular exercise. Follow up 3 months.

## 2014-08-10 ENCOUNTER — Other Ambulatory Visit (INDEPENDENT_AMBULATORY_CARE_PROVIDER_SITE_OTHER): Payer: BC Managed Care – PPO

## 2014-08-10 DIAGNOSIS — Z0000 Encounter for general adult medical examination without abnormal findings: Secondary | ICD-10-CM

## 2014-08-10 LAB — POCT URINALYSIS DIP (MANUAL ENTRY)
BILIRUBIN UA: NEGATIVE
BILIRUBIN UA: NEGATIVE
Blood, UA: NEGATIVE
GLUCOSE UA: NEGATIVE
Leukocytes, UA: NEGATIVE
Nitrite, UA: NEGATIVE
Protein Ur, POC: NEGATIVE
Spec Grav, UA: 1.02
Urobilinogen, UA: 0.2
pH, UA: 5.5

## 2014-08-10 LAB — HEPATIC FUNCTION PANEL
ALT: 25 U/L (ref 0–53)
AST: 16 U/L (ref 0–37)
Albumin: 4 g/dL (ref 3.5–5.2)
Alkaline Phosphatase: 45 U/L (ref 39–117)
BILIRUBIN TOTAL: 0.7 mg/dL (ref 0.2–1.2)
Bilirubin, Direct: 0.1 mg/dL (ref 0.0–0.3)
Total Protein: 6.6 g/dL (ref 6.0–8.3)

## 2014-08-10 LAB — CBC WITH DIFFERENTIAL/PLATELET
BASOS ABS: 0 10*3/uL (ref 0.0–0.1)
Basophils Relative: 0.3 % (ref 0.0–3.0)
EOS ABS: 0 10*3/uL (ref 0.0–0.7)
Eosinophils Relative: 1.4 % (ref 0.0–5.0)
HCT: 41.5 % (ref 39.0–52.0)
Hemoglobin: 14.3 g/dL (ref 13.0–17.0)
LYMPHS PCT: 41.9 % (ref 12.0–46.0)
Lymphs Abs: 1.4 10*3/uL (ref 0.7–4.0)
MCHC: 34.4 g/dL (ref 30.0–36.0)
MCV: 96.3 fl (ref 78.0–100.0)
Monocytes Absolute: 0.2 10*3/uL (ref 0.1–1.0)
Monocytes Relative: 5.9 % (ref 3.0–12.0)
NEUTROS PCT: 50.5 % (ref 43.0–77.0)
Neutro Abs: 1.7 10*3/uL (ref 1.4–7.7)
Platelets: 204 10*3/uL (ref 150.0–400.0)
RBC: 4.31 Mil/uL (ref 4.22–5.81)
RDW: 13.6 % (ref 11.5–15.5)
WBC: 3.3 10*3/uL — ABNORMAL LOW (ref 4.0–10.5)

## 2014-08-10 LAB — BASIC METABOLIC PANEL
BUN: 16 mg/dL (ref 6–23)
CALCIUM: 9.2 mg/dL (ref 8.4–10.5)
CHLORIDE: 106 meq/L (ref 96–112)
CO2: 27 mEq/L (ref 19–32)
Creatinine, Ser: 1 mg/dL (ref 0.40–1.50)
GFR: 79.72 mL/min (ref >60.00)
Glucose, Bld: 103 mg/dL — ABNORMAL HIGH (ref 70–99)
POTASSIUM: 4.1 meq/L (ref 3.5–5.1)
Sodium: 139 mEq/L (ref 135–145)

## 2014-08-10 LAB — LIPID PANEL
CHOL/HDL RATIO: 3
Cholesterol: 146 mg/dL (ref 0–200)
HDL: 43.1 mg/dL (ref >39.00)
LDL Cholesterol: 79 mg/dL (ref 0–99)
NonHDL: 102.9
Triglycerides: 122 mg/dL (ref 0.0–149.0)
VLDL: 24.4 mg/dL (ref 0.0–40.0)

## 2014-08-10 LAB — PSA: PSA: 3.37 ng/mL (ref 0.10–4.00)

## 2014-08-10 LAB — TSH: TSH: 3.36 u[IU]/mL (ref 0.35–4.50)

## 2014-08-12 ENCOUNTER — Emergency Department (HOSPITAL_COMMUNITY): Payer: BC Managed Care – PPO

## 2014-08-12 ENCOUNTER — Encounter (HOSPITAL_COMMUNITY): Payer: Self-pay | Admitting: Emergency Medicine

## 2014-08-12 ENCOUNTER — Observation Stay (HOSPITAL_COMMUNITY)
Admission: EM | Admit: 2014-08-12 | Discharge: 2014-08-13 | Disposition: A | Discharge status: Home / Self Care | Payer: BC Managed Care – PPO | Attending: Internal Medicine | Admitting: Internal Medicine

## 2014-08-12 DIAGNOSIS — Z7982 Long term (current) use of aspirin: Secondary | ICD-10-CM | POA: Insufficient documentation

## 2014-08-12 DIAGNOSIS — R739 Hyperglycemia, unspecified: Secondary | ICD-10-CM | POA: Diagnosis not present

## 2014-08-12 DIAGNOSIS — K219 Gastro-esophageal reflux disease without esophagitis: Secondary | ICD-10-CM | POA: Insufficient documentation

## 2014-08-12 DIAGNOSIS — R0789 Other chest pain: Principal | ICD-10-CM | POA: Insufficient documentation

## 2014-08-12 DIAGNOSIS — E785 Hyperlipidemia, unspecified: Secondary | ICD-10-CM | POA: Diagnosis not present

## 2014-08-12 DIAGNOSIS — R079 Chest pain, unspecified: Secondary | ICD-10-CM

## 2014-08-12 DIAGNOSIS — I10 Essential (primary) hypertension: Secondary | ICD-10-CM | POA: Diagnosis not present

## 2014-08-12 LAB — BRAIN NATRIURETIC PEPTIDE: B NATRIURETIC PEPTIDE 5: 29.6 pg/mL (ref 0.0–100.0)

## 2014-08-12 LAB — BASIC METABOLIC PANEL
Anion gap: 7 (ref 5–15)
BUN: 17 mg/dL (ref 6–23)
CHLORIDE: 108 mmol/L (ref 96–112)
CO2: 22 mmol/L (ref 19–32)
Calcium: 9 mg/dL (ref 8.4–10.5)
Creatinine, Ser: 1.56 mg/dL — ABNORMAL HIGH (ref 0.50–1.35)
GFR calc Af Amer: 52 mL/min — ABNORMAL LOW (ref >90)
GFR calc non Af Amer: 45 mL/min — ABNORMAL LOW (ref >90)
GLUCOSE: 116 mg/dL — AB (ref 70–99)
Potassium: 3.8 mmol/L (ref 3.5–5.1)
SODIUM: 137 mmol/L (ref 135–145)

## 2014-08-12 LAB — I-STAT TROPONIN, ED: TROPONIN I, POC: 0 ng/mL (ref 0.00–0.08)

## 2014-08-12 LAB — CBC WITH DIFFERENTIAL/PLATELET
Basophils Absolute: 0 10*3/uL (ref 0.0–0.1)
Basophils Relative: 0 % (ref 0–1)
EOS PCT: 1 % (ref 0–5)
Eosinophils Absolute: 0.1 10*3/uL (ref 0.0–0.7)
HEMATOCRIT: 41.1 % (ref 39.0–52.0)
HEMOGLOBIN: 14.7 g/dL (ref 13.0–17.0)
LYMPHS ABS: 1.8 10*3/uL (ref 0.7–4.0)
Lymphocytes Relative: 45 % (ref 12–46)
MCH: 33.3 pg (ref 26.0–34.0)
MCHC: 35.8 g/dL (ref 30.0–36.0)
MCV: 93 fL (ref 78.0–100.0)
MONOS PCT: 5 % (ref 3–12)
Monocytes Absolute: 0.2 10*3/uL (ref 0.1–1.0)
NEUTROS PCT: 49 % (ref 43–77)
Neutro Abs: 1.9 10*3/uL (ref 1.7–7.7)
Platelets: 187 10*3/uL (ref 150–400)
RBC: 4.42 MIL/uL (ref 4.22–5.81)
RDW: 12.9 % (ref 11.5–15.5)
WBC: 3.9 10*3/uL — ABNORMAL LOW (ref 4.0–10.5)

## 2014-08-12 MED ORDER — ASPIRIN 325 MG PO TABS
325.0000 mg | ORAL_TABLET | ORAL | Status: AC
  Administered 2014-08-12: 325 mg via ORAL
  Filled 2014-08-12: qty 1

## 2014-08-12 NOTE — ED Provider Notes (Signed)
CSN: [id number]     Arrival date & time 08/12/14  2204 History   First MD Initiated Contact with Patient 08/12/14 2317     Chief Complaint  Patient presents with  . Chest Pain    The patient is haivng chest pain and it started at 2030hrs.  He said he was in a meeting and began to feel the chest pain, SOB and got "clammy".       (Consider location/radiation/quality/duration/timing/severity/associated sxs/prior Treatment) HPI   64 year old male presents complaining of 1 week history of chest pain.  He experienced an episode of nausea last week and ever since then has not felt like himself.  He endorses anterior bilateral chest pain that is exacerbated by movement and deep inspiration.  It radiates bilaterally down both shoulders.  Denies fever, chills, HA, abdominal pain, SOB, and DOE.  Tonight he experienced an episode of flushing and diaphoresis which is what prompted his visit.  He has a physical scheduled with his PCP this coming Tuesday.  Past Medical History  Diagnosis Date  . NEOPLASM, MALIGNANT, SKIN, BACK 02/09/2009  . HYPERLIPIDEMIA 02/04/2007  . Chronic prostatitis 12/17/2008  . ERECTILE DYSFUNCTION 02/07/2007  . HYPERTENSION 02/04/2007  . Diverticulosis 01/16/2011    History of diverticulitis    Past Surgical History  Procedure Laterality Date  . None     Family History  Problem Relation Age of Onset  . Adopted: Yes  . Breast cancer Mother   . Alzheimer's disease Father     late 69s. mid 34s  . Asperger's syndrome Son   . CVA Mother     quit taking HTN meds   History  Substance Use Topics  . Smoking status: Never Smoker   . Smokeless tobacco: Not on file  . Alcohol Use: 1.8 oz/week    3 Standard drinks or equivalent per week    Review of Systems All other systems negative except as documented in the HPI. All pertinent positives and negatives as reviewed in the HPI.  Allergies  Review of patient's allergies indicates no known allergies.  Home Medications    Prior to Admission medications   Medication Sig Start Date End Date Taking? Authorizing Provider  aspirin 81 MG tablet Take 81 mg by mouth daily.      Historical Provider, MD  atenolol (TENORMIN) 50 MG tablet Take 1 tablet (50 mg total) by mouth daily. 08/17/13   Ricard Dillon, MD  atovaquone-proguanil (MALARONE) 250-100 MG TABS Take 1 tablet 1-2 days prior to leaving then once daily during stay then continue 7 days after returning 02/02/14   Ricard Dillon, MD  folic acid (FOLVITE) 832 MCG tablet Take 1 tablet (400 mcg total) by mouth daily. 08/15/12   Ricard Dillon, MD  Multiple Vitamin (MULTIVITAMIN) capsule Take 1 capsule by mouth daily.      Historical Provider, MD  omeprazole (PRILOSEC OTC) 20 MG tablet take 1 tablet by mouth once daily 11/02/13   Ricard Dillon, MD  sildenafil (REVATIO) 20 MG tablet Take 2 tablets (40 mg total) by mouth daily as needed. 11/13/13   Ricard Dillon, MD  simvastatin (ZOCOR) 40 MG tablet TAKE 1 TABLET BY MOUTH ONCE DAILY 02/02/14   Ricard Dillon, MD   BP 149/95 mmHg  Pulse 73  Temp(Src) 98.4 F (36.9 C) (Oral)  Resp 15  Ht 6' (1.829 m)  Wt 220 lb (99.791 kg)  BMI 29.83 kg/m2  SpO2 99% Physical Exam  Constitutional: He is  oriented to person, place, and time. He appears well-developed and well-nourished. No distress.  HENT:  Head: Normocephalic and atraumatic.  Mouth/Throat: Oropharynx is clear and moist.  Eyes: Conjunctivae and EOM are normal. Pupils are equal, round, and reactive to light.  Neck: Normal range of motion. Neck supple. No JVD present.  Cardiovascular: Normal rate, regular rhythm, S1 normal, S2 normal and normal heart sounds.   No murmur heard. Pulmonary/Chest: Effort normal and breath sounds normal. No respiratory distress. He has no wheezes. He has no rales. He exhibits no tenderness.  Abdominal: Soft. Bowel sounds are normal. He exhibits no distension. There is no tenderness. There is no rebound and no guarding.  Musculoskeletal:  Normal range of motion. He exhibits no tenderness.  Neurological: He is alert and oriented to person, place, and time. He exhibits normal muscle tone. Coordination normal.  Skin: Skin is warm and dry. No rash noted. He is not diaphoretic. No erythema.  Nursing note and vitals reviewed.   ED Course  Procedures (including critical care time) Labs Review Labs Reviewed  BASIC METABOLIC PANEL - Abnormal; Notable for the following:    Glucose, Bld 116 (*)    Creatinine, Ser 1.56 (*)    GFR calc non Af Amer 45 (*)    GFR calc Af Amer 52 (*)    All other components within normal limits  CBC WITH DIFFERENTIAL/PLATELET - Abnormal; Notable for the following:    WBC 3.9 (*)    All other components within normal limits  BRAIN NATRIURETIC PEPTIDE  I-STAT TROPOININ, ED    Imaging Review Dg Chest 2 View  08/12/2014   CLINICAL DATA:  Initial evaluation for acute chest pain.  EXAM: CHEST  2 VIEW  COMPARISON:  None.  FINDINGS: Heart size within normal limits. There is tortuosity of the intrathoracic aorta with scattered atherosclerotic calcifications. Mediastinal silhouette within normal limits.  The lungs are normally inflated. No airspace consolidation, pleural effusion, or pulmonary edema is identified. There is no pneumothorax.  No acute osseous abnormality identified.  IMPRESSION: 1. No active cardiopulmonary disease. 2. Tortuous intrathoracic aorta with associated atheromatous disease.   Electronically Signed   By: Jeannine Boga M.D.   On: 08/12/2014 23:55   patient will need further evaluation and care in the hospital.  The [facility] will be down to see the patient.  Patient is stable.  He does state he still has some chest pressure but it is mild at this point.  Patient agrees to the plan and all questions were answered     EKG Interpretation   Date/Time:  Thursday August 12 2014 22:14:07 EST Ventricular Rate:  84 PR Interval:  142 QRS Duration: 82 QT Interval:  374 QTC  Calculation: 441 R Axis:   79 Text Interpretation:  Normal sinus rhythm Nonspecific ST and T wave  abnormality Abnormal ECG agree. abnormal Confirmed by Johnney Killian, MD, Jeannie Done  [phone]) on 08/13/2014 12:41:33 AM          Brent General, PA-C 08/13/14 [id number]  Charlesetta Shanks, MD 08/13/14 [id number]

## 2014-08-12 NOTE — ED Notes (Signed)
The patient is haivng chest pain and it started at 2030hrs.  He said he was in a meeting and began to feel the chest pain, SOB and got "clammy".  The patient said he got home and was diaphoretic so her decided to come to the ED.

## 2014-08-13 ENCOUNTER — Encounter (HOSPITAL_COMMUNITY): Payer: Self-pay | Admitting: *Deleted

## 2014-08-13 ENCOUNTER — Other Ambulatory Visit (HOSPITAL_COMMUNITY): Payer: Self-pay | Admitting: Internal Medicine

## 2014-08-13 DIAGNOSIS — R231 Pallor: Secondary | ICD-10-CM

## 2014-08-13 DIAGNOSIS — K219 Gastro-esophageal reflux disease without esophagitis: Secondary | ICD-10-CM

## 2014-08-13 DIAGNOSIS — R079 Chest pain, unspecified: Secondary | ICD-10-CM

## 2014-08-13 DIAGNOSIS — I10 Essential (primary) hypertension: Secondary | ICD-10-CM | POA: Diagnosis not present

## 2014-08-13 DIAGNOSIS — N179 Acute kidney failure, unspecified: Secondary | ICD-10-CM

## 2014-08-13 DIAGNOSIS — R0789 Other chest pain: Secondary | ICD-10-CM | POA: Diagnosis not present

## 2014-08-13 DIAGNOSIS — E785 Hyperlipidemia, unspecified: Secondary | ICD-10-CM

## 2014-08-13 LAB — CBC
HCT: 37 % — ABNORMAL LOW (ref 39.0–52.0)
HEMOGLOBIN: 12.9 g/dL — AB (ref 13.0–17.0)
MCH: 32.7 pg (ref 26.0–34.0)
MCHC: 34.9 g/dL (ref 30.0–36.0)
MCV: 93.9 fL (ref 78.0–100.0)
Platelets: 164 10*3/uL (ref 150–400)
RBC: 3.94 MIL/uL — AB (ref 4.22–5.81)
RDW: 12.9 % (ref 11.5–15.5)
WBC: 3.5 10*3/uL — ABNORMAL LOW (ref 4.0–10.5)

## 2014-08-13 LAB — TROPONIN I
Troponin I: <0.03 ng/mL (ref <0.031)
Troponin I: <0.03 ng/mL (ref <0.031)

## 2014-08-13 LAB — CREATININE, SERUM
Creatinine, Ser: 0.93 mg/dL (ref 0.50–1.35)
GFR calc Af Amer: >90 mL/min (ref >90)
GFR, EST NON AFRICAN AMERICAN: 87 mL/min — AB (ref >90)

## 2014-08-13 MED ORDER — HEPARIN SODIUM (PORCINE) 5000 UNIT/ML IJ SOLN
5000.0000 [IU] | Freq: Three times a day (TID) | INTRAMUSCULAR | Status: DC
  Administered 2014-08-13: 5000 [IU] via SUBCUTANEOUS
  Filled 2014-08-13 (×4): qty 1

## 2014-08-13 MED ORDER — ONDANSETRON HCL 4 MG/2ML IJ SOLN: 4.0000 mg | Freq: Four times a day (QID) | INTRAMUSCULAR | Status: DC | PRN

## 2014-08-13 MED ORDER — ZOLPIDEM TARTRATE 5 MG PO TABS: 5.0000 mg | ORAL_TABLET | Freq: Every evening | ORAL | Status: DC | PRN

## 2014-08-13 MED ORDER — ATENOLOL 50 MG PO TABS
50.0000 mg | ORAL_TABLET | Freq: Every day | ORAL | Status: DC
  Administered 2014-08-13: 50 mg via ORAL
  Filled 2014-08-13 (×2): qty 1

## 2014-08-13 MED ORDER — ACETAMINOPHEN 325 MG PO TABS: 650.0000 mg | ORAL_TABLET | ORAL | Status: DC | PRN

## 2014-08-13 MED ORDER — FOLIC ACID 0.5 MG HALF TAB
500.0000 ug | ORAL_TABLET | Freq: Every day | ORAL | Status: DC
  Administered 2014-08-13: 0.5 mg via ORAL
  Filled 2014-08-13: qty 1

## 2014-08-13 MED ORDER — SIMVASTATIN 40 MG PO TABS
40.0000 mg | ORAL_TABLET | Freq: Every day | ORAL | Status: DC
  Administered 2014-08-13: 40 mg via ORAL
  Filled 2014-08-13: qty 1

## 2014-08-13 MED ORDER — MORPHINE SULFATE 2 MG/ML IJ SOLN: 2.0000 mg | INTRAMUSCULAR | Status: DC | PRN

## 2014-08-13 MED ORDER — PANTOPRAZOLE SODIUM 40 MG PO TBEC
40.0000 mg | DELAYED_RELEASE_TABLET | Freq: Two times a day (BID) | ORAL | Status: DC
  Administered 2014-08-13: 40 mg via ORAL
  Filled 2014-08-13 (×3): qty 1

## 2014-08-13 MED ORDER — ASPIRIN EC 325 MG PO TBEC
325.0000 mg | DELAYED_RELEASE_TABLET | Freq: Every day | ORAL | Status: DC
Start: 2014-08-13 — End: 2014-08-13
  Administered 2014-08-13: 325 mg via ORAL
  Filled 2014-08-13: qty 1

## 2014-08-13 MED ORDER — SODIUM CHLORIDE 0.9 % IV SOLN
INTRAVENOUS | Status: DC
  Administered 2014-08-13: 02:00:00 via INTRAVENOUS

## 2014-08-13 MED ORDER — ATENOLOL 50 MG PO TABS: 50.0000 mg | ORAL_TABLET | Freq: Every day | ORAL | Status: DC

## 2014-08-13 NOTE — Progress Notes (Signed)
UR completed 

## 2014-08-13 NOTE — Consult Note (Addendum)
CONSULTATION NOTE  Reason for Consult: Chest pain   Requesting Physician: Dr. Algis Liming  Cardiologist: None (NEW)  HPI: This is a 64 y.o. male with a past medical history significant for hypertension and dyslipidemia. He is a lifelong nonsmoker. He reports over the past day he's felt unwell. He said yesterday he felt somewhat fatigued during the day and clammy. He went to a meeting and felt a little bit hot and diaphoretic. He thought he felt a little bit of tingling in the left arm. He denied any chest pain or worsening shortness of breath. He went to bed and then started having more symptoms again after they had subsided and felt that he should come to the emergency room. Although the emergency department indicated that he had chest pain and shortness of breath, he vehemently denies that today. EKG demonstrated sinus rhythm with nonspecific ST and T-wave abnormalities. Today his EKG has normalized. It should be noted that on admission he was in acute renal failure with a creatinine of 1.56, with a baseline creatinine of 1. His creatinine today is now back to 0.93 after hydration overnight. Troponin is negative and BNP was 29.6. Lipid profile shows good control with LDL cholesterol 79 and total cholesterol 146. H&H is 13 and 37 today down from 15 and 41, presumably due to hydration. Chest x-ray shows a tortuous thoracic aorta with some atherosclerotic disease. Cardiology is asked to consult regarding his clamminess and presumed chest discomfort  PMHx:  Past Medical History  Diagnosis Date  . NEOPLASM, MALIGNANT, SKIN, BACK 02/09/2009  . HYPERLIPIDEMIA 02/04/2007  . Chronic prostatitis 12/17/2008  . ERECTILE DYSFUNCTION 02/07/2007  . HYPERTENSION 02/04/2007  . Diverticulosis 01/16/2011    History of diverticulitis    Past Surgical History  Procedure Laterality Date  . None      FAMHx: Family History  Problem Relation Age of Onset  . Breast cancer Mother   . Alzheimer's disease Father      late 28s. mid 26s  . Asperger's syndrome Son   . CVA Mother     quit taking HTN meds    SOCHx:  reports that he has never smoked. He does not have any smokeless tobacco history on file. He reports that he drinks about 1.8 oz of alcohol per week. He reports that he does not use illicit drugs.  ALLERGIES: No Known Allergies  ROS: A comprehensive review of systems was negative except for: Constitutional: positive for fatigue and Clamminess, diaphoresis  HOME MEDICATIONS: Prescriptions prior to admission  Medication Sig Dispense Refill Last Dose  . aspirin 81 MG tablet Take 81 mg by mouth daily.     08/11/2014  . atenolol (TENORMIN) 50 MG tablet Take 1 tablet (50 mg total) by mouth daily. 90 tablet 3 08/11/2014 at 2400  . folic acid (FOLVITE) 732 MCG tablet Take 1 tablet (400 mcg total) by mouth daily. 30 tablet 1 08/11/2014  . Multiple Vitamin (MULTIVITAMIN) capsule Take 1 capsule by mouth daily.     08/11/2014  . naproxen sodium (ANAPROX) 220 MG tablet Take 220 mg by mouth daily as needed (pain).   08/11/2014  . omeprazole (PRILOSEC OTC) 20 MG tablet take 1 tablet by mouth once daily 90 tablet 3 08/12/2014 at Unknown time  . sildenafil (REVATIO) 20 MG tablet Take 2 tablets (40 mg total) by mouth daily as needed. (Patient taking differently: Take 40 mg by mouth daily as needed (erectile dysfunction). ) 60 tablet 5 couple months  . simvastatin (ZOCOR)  40 MG tablet TAKE 1 TABLET BY MOUTH ONCE DAILY 90 tablet 2 08/11/2014  . Sodium Fluoride (PREVIDENT 5000 BOOSTER PLUS DT) Place 1 application onto teeth 2 (two) times daily.   08/12/2014 at Unknown time    HOSPITAL MEDICATIONS: Prior to Admission:  Prescriptions prior to admission  Medication Sig Dispense Refill Last Dose  . aspirin 81 MG tablet Take 81 mg by mouth daily.     08/11/2014  . atenolol (TENORMIN) 50 MG tablet Take 1 tablet (50 mg total) by mouth daily. 90 tablet 3 08/11/2014 at 2400  . folic acid (FOLVITE) 607 MCG tablet Take 1  tablet (400 mcg total) by mouth daily. 30 tablet 1 08/11/2014  . Multiple Vitamin (MULTIVITAMIN) capsule Take 1 capsule by mouth daily.     08/11/2014  . naproxen sodium (ANAPROX) 220 MG tablet Take 220 mg by mouth daily as needed (pain).   08/11/2014  . omeprazole (PRILOSEC OTC) 20 MG tablet take 1 tablet by mouth once daily 90 tablet 3 08/12/2014 at Unknown time  . sildenafil (REVATIO) 20 MG tablet Take 2 tablets (40 mg total) by mouth daily as needed. (Patient taking differently: Take 40 mg by mouth daily as needed (erectile dysfunction). ) 60 tablet 5 couple months  . simvastatin (ZOCOR) 40 MG tablet TAKE 1 TABLET BY MOUTH ONCE DAILY 90 tablet 2 08/11/2014  . Sodium Fluoride (PREVIDENT 5000 BOOSTER PLUS DT) Place 1 application onto teeth 2 (two) times daily.   08/12/2014 at Unknown time    VITALS: Blood pressure 111/67, pulse 62, temperature 98.5 F (36.9 C), temperature source Oral, resp. rate 18, height 6' (1.829 m), weight 227 lb 8.2 oz (103.2 kg), SpO2 99 %.  PHYSICAL EXAM: General appearance: alert and no distress Neck: no carotid bruit and no JVD Lungs: clear to auscultation bilaterally Heart: regular rate and rhythm, S1, S2 normal, no murmur, click, rub or gallop Abdomen: soft, non-tender; bowel sounds normal; no masses,  no organomegaly Extremities: extremities normal, atraumatic, no cyanosis or edema Pulses: 2+ and symmetric Skin: Skin color, texture, turgor normal. No rashes or lesions Neurologic: Grossly normal Psych: Normal mood, affect  LABS: Results for orders placed or performed during the hospital encounter of 08/12/14 (from the past 48 hour(s))  Basic metabolic panel     Status: Abnormal   Collection Time: 08/12/14 10:35 PM  Result Value Ref Range   Sodium 137 135 - 145 mmol/L   Potassium 3.8 3.5 - 5.1 mmol/L   Chloride 108 96 - 112 mmol/L   CO2 22 19 - 32 mmol/L   Glucose, Bld 116 (H) 70 - 99 mg/dL   BUN 17 6 - 23 mg/dL   Creatinine, Ser 1.56 (H) 0.50 - 1.35 mg/dL     Calcium 9.0 8.4 - 10.5 mg/dL   GFR calc non Af Amer 45 (L) >90 mL/min   GFR calc Af Amer 52 (L) >90 mL/min    Comment: (NOTE) The eGFR has been calculated using the CKD EPI equation. This calculation has not been validated in all clinical situations. eGFR's persistently <90 mL/min signify possible Chronic Kidney Disease.    Anion gap 7 5 - 15  BNP (order ONLY if patient complains of dyspnea/SOB AND you have documented it for THIS visit)     Status: None   Collection Time: 08/12/14 10:35 PM  Result Value Ref Range   B Natriuretic Peptide 29.6 0.0 - 100.0 pg/mL  CBC with Differential     Status: Abnormal   Collection Time:  08/12/14 10:35 PM  Result Value Ref Range   WBC 3.9 (L) 4.0 - 10.5 K/uL   RBC 4.42 4.22 - 5.81 MIL/uL   Hemoglobin 14.7 13.0 - 17.0 g/dL   HCT 41.1 39.0 - 52.0 %   MCV 93.0 78.0 - 100.0 fL   MCH 33.3 26.0 - 34.0 pg   MCHC 35.8 30.0 - 36.0 g/dL   RDW 12.9 11.5 - 15.5 %   Platelets 187 150 - 400 K/uL   Neutrophils Relative % 49 43 - 77 %   Neutro Abs 1.9 1.7 - 7.7 K/uL   Lymphocytes Relative 45 12 - 46 %   Lymphs Abs 1.8 0.7 - 4.0 K/uL   Monocytes Relative 5 3 - 12 %   Monocytes Absolute 0.2 0.1 - 1.0 K/uL   Eosinophils Relative 1 0 - 5 %   Eosinophils Absolute 0.1 0.0 - 0.7 K/uL   Basophils Relative 0 0 - 1 %   Basophils Absolute 0.0 0.0 - 0.1 K/uL  I-stat troponin, ED (not at [facility])     Status: None   Collection Time: 08/12/14 10:45 PM  Result Value Ref Range   Troponin i, poc 0.00 0.00 - 0.08 ng/mL   Comment 3            Comment: Due to the release kinetics of cTnI, a negative result within the first hours of the onset of symptoms does not rule out myocardial infarction with certainty. If myocardial infarction is still suspected, repeat the test at appropriate intervals.   Troponin I-serum (0, 3, 6 hours)     Status: None   Collection Time: 08/13/14  6:01 AM  Result Value Ref Range   Troponin I <0.03 <0.031 ng/mL    Comment:        NO INDICATION  OF MYOCARDIAL INJURY.   Troponin I-serum (0, 3, 6 hours)     Status: None   Collection Time: 08/13/14  6:01 AM  Result Value Ref Range   Troponin I <0.03 <0.031 ng/mL    Comment:        NO INDICATION OF MYOCARDIAL INJURY.   CBC     Status: Abnormal   Collection Time: 08/13/14  6:01 AM  Result Value Ref Range   WBC 3.5 (L) 4.0 - 10.5 K/uL   RBC 3.94 (L) 4.22 - 5.81 MIL/uL   Hemoglobin 12.9 (L) 13.0 - 17.0 g/dL   HCT 37.0 (L) 39.0 - 52.0 %   MCV 93.9 78.0 - 100.0 fL   MCH 32.7 26.0 - 34.0 pg   MCHC 34.9 30.0 - 36.0 g/dL   RDW 12.9 11.5 - 15.5 %   Platelets 164 150 - 400 K/uL  Creatinine, serum     Status: Abnormal   Collection Time: 08/13/14  6:01 AM  Result Value Ref Range   Creatinine, Ser 0.93 0.50 - 1.35 mg/dL   GFR calc non Af Amer 87 (L) >90 mL/min   GFR calc Af Amer >90 >90 mL/min    Comment: (NOTE) The eGFR has been calculated using the CKD EPI equation. This calculation has not been validated in all clinical situations. eGFR's persistently <90 mL/min signify possible Chronic Kidney Disease.     IMAGING: Dg Chest 2 View  08/12/2014   CLINICAL DATA:  Initial evaluation for acute chest pain.  EXAM: CHEST  2 VIEW  COMPARISON:  None.  FINDINGS: Heart size within normal limits. There is tortuosity of the intrathoracic aorta with scattered atherosclerotic calcifications. Mediastinal silhouette within normal  limits.  The lungs are normally inflated. No airspace consolidation, pleural effusion, or pulmonary edema is identified. There is no pneumothorax.  No acute osseous abnormality identified.  IMPRESSION: 1. No active cardiopulmonary disease. 2. Tortuous intrathoracic aorta with associated atheromatous disease.   Electronically Signed   By: Jeannine Boga M.D.   On: 08/12/2014 23:55    HOSPITAL DIAGNOSES: Principal Problem:   Chest pain Active Problems:   Hyperlipidemia   Essential hypertension   GERD (gastroesophageal reflux  disease)   IMPRESSION: 1. Clamminess/diaphoresis-unlikely to be related to acute coronary syndrome  2. Hypertension 3. Dyslipidemia   RECOMMEND: 1.   Mr. Koloski reports today that he did not have any specific chest pain or discomfort. He has had some intermittent left shoulder pain and some tingling in the left arm over the past several months. He reported some clamminess and possibly diaphoresis yesterday which was his presentation. His cardiac enzymes are negative. His EKG was mildly abnormal but for some reason he was in acute renal failure, however after hydration his renal function is normalized today and his EKG has normalized. This could be related to a viral syndrome. He does have a mildly depressed white blood cell count. I feel that further outpatient workup could be warranted based on the fact that he does have some aortic atherosclerosis based on his chest x-ray. He tells me that he had carotid Dopplers done several years ago in [place] which demonstrated no carotid artery disease. I will arrange for outpatient exercise stress testing and follow-up with me in the office. He reports he has an appointment with his primary care provider next Tuesday which she should keep.   Thanks for consulting [place].  Time Spent Directly with Patient: 30 minutes  Pixie Casino, MD, [facility] Attending Cardiologist [facility]  HILTY,Kenneth C 08/13/2014, 8:19 AM

## 2014-08-13 NOTE — Progress Notes (Signed)
Pt discharged home with wife Discharge instructions given & reviewed Education discussed  IV dc'd  Tele dc'd  Pt discharged via wheelchair with volunteer services  All patient belongs at side.   Sherrie Mustache 1:38 PM

## 2014-08-13 NOTE — Discharge Summary (Addendum)
Physician Discharge Summary  Brandon [name] WLN:[id number] DOB: 16-May-1951 DOA: 08/12/2014  PCP: Garret Reddish, MD  Admit date: 08/12/2014 Discharge date: 08/13/2014  Time spent: Less than 30 minutes  Recommendations for Outpatient Follow-up:  1. Dr. Garret Reddish, PCP: Keep previous appointment on 08/17/14. Recommend repeating labs (CBC & BMP). 2. [facility] on 08/27/14 at 1 PM for exercise Myoview stress test. 3. Dr. Lyman Bishop, cardiology on 09/02/14 at 8:45 AM.  Discharge Diagnoses:  Principal Problem:   Chest pain Active Problems:   Hyperlipidemia   Essential hypertension   GERD (gastroesophageal reflux disease)   Discharge Condition: Improved & Stable  Diet recommendation: Heart healthy diet.  Filed Weights   08/12/14 2220 08/13/14 0112  Weight: 99.791 kg (220 lb) 103.2 kg (227 lb 8.2 oz)    History of present illness & Hospital course:  64 year old male patient with history of HTN, HLD, erectile dysfunction, lifelong nonsmoker, has been feeling generally unwell for the last week. He complains of intermittent "hot flashes" but denies fevers, chills, rigors or any symptoms suggestive of specific source of infection. On day of admission, he was at a social meeting and the room was apparently quite warm. He felt same flushed sensation. He felt better after going out in the cooler weather. On reaching home, he started having sweating. He has ongoing intermittent left shoulder discomfort, made worse with movement and reproducible, ongoing for 1 week with associated tingling of left arm. He also complained of right shoulder pain last night. He states that he is an anxious person and was concerned about the sweats he had last night and after discussing with his spouse, decided to come to the emergency department. Although documented chest pain and dyspnea in earlier notes, patient vehemently denies either one of them to cardiologist and me today. He states that he had stress test as  part of his work evaluation approximately 15 years ago which was negative. Colonoscopy done 5 years ago showed 2 benign polyps. In the ED, EKG demonstrated nonspecific ST-T wave changes and sinus rhythm. Troponins were cycled and negative. Creatinine was elevated at 1.56 last night although 2 days prior it was normal. He denies GI losses but states that his appetite has slightly decreased recently. He does take Aleve periodically for left shoulder pain. He was hydrated with IV fluids. Creatinine has normalized. Patient has mild leukopenia and anemia which can be followed as outpatient. Cardiology was consulted for initially reported chest discomfort. They have recommended outpatient workup and arranged for such. Patient also complains of intermittent dizziness which has been ongoing for weeks. Orthostatic blood pressure checks normal. No focal neurological deficits. Patient may have had some kind of a nonspecific viral syndrome. He has been advised liberal oral fluid intake, rest and gradual increase in activity, keep up follow-up appointment with PCP next Tuesday along with repeat labs and avoid NSAIDs secondary to recent acute kidney injury. If his symptoms persist or worsen, he may need further evaluation as outpatient. He verbalized understanding.   Consultations:  Cardiology  Procedures:  None    Discharge Exam:  Complaints:  Denies chest pain or dyspnea. Intermittent left shoulder pain and left upper extremity tingling. Occasional light shoulder pain. Occasional dizziness but no episodes of passing out or falling. Intermittent flushed sensation.  Filed Vitals:   08/12/14 2300 08/12/14 2330 08/13/14 0112 08/13/14 0430  BP: 149/95 141/87 136/88 111/67  Pulse: 73 67 74 62  Temp:   98.3 F (36.8 C) 98.5 F (36.9 C)  TempSrc:  Oral Oral  Resp: 15 13 18 18   Height:      Weight:   103.2 kg (227 lb 8.2 oz)   SpO2: 99% 98% 100% 99%    General exam: Pleasant middle-aged male lying  comfortably in bed. Respiratory system: Clear. No increased work of breathing. No reproducible chest wall tenderness. Cardiovascular system: S1 & S2 heard, RRR. No JVD, murmurs, gallops, clicks or pedal edema. Telemetry: Sinus bradycardia in the 50s-sinus rhythm in the 70s. Gastrointestinal system: Abdomen is nondistended, soft and nontender. Normal bowel sounds heard. Central nervous system: Alert and oriented. No focal neurological deficits. Extremities: Symmetric 5 x 5 power.  Discharge Instructions      Discharge Instructions    Call MD for:  difficulty breathing, headache or visual disturbances    Complete by:  As directed      Call MD for:  extreme fatigue    Complete by:  As directed      Call MD for:  persistant dizziness or light-headedness    Complete by:  As directed      Call MD for:  severe uncontrolled pain    Complete by:  As directed      Diet - low sodium heart healthy    Complete by:  As directed      Increase activity slowly    Complete by:  As directed             Medication List    STOP taking these medications        naproxen sodium 220 MG tablet  Commonly known as:  ANAPROX      TAKE these medications        acetaminophen 325 MG tablet  Commonly known as:  TYLENOL  Take 2 tablets (650 mg total) by mouth every 4 (four) hours as needed for mild pain, moderate pain or headache.     aspirin 81 MG tablet  Take 81 mg by mouth daily.     atenolol 50 MG tablet  Commonly known as:  TENORMIN  Take 1 tablet (50 mg total) by mouth daily.     folic acid 202 MCG tablet  Commonly known as:  FOLVITE  Take 1 tablet (400 mcg total) by mouth daily.     multivitamin capsule  Take 1 capsule by mouth daily.     omeprazole 20 MG tablet  Commonly known as:  PRILOSEC OTC  take 1 tablet by mouth once daily     PREVIDENT 5000 BOOSTER PLUS DT  Place 1 application onto teeth 2 (two) times daily.     sildenafil 20 MG tablet  Commonly known as:  REVATIO  Take  2 tablets (40 mg total) by mouth daily as needed.     simvastatin 40 MG tablet  Commonly known as:  ZOCOR  TAKE 1 TABLET BY MOUTH ONCE DAILY       Follow-up Information    Follow up with [facility] On 08/27/2014.   Specialty:  Cardiology   Why:  1:00 pm - exercise myoview stress test   Contact information:   [address] [place] [phone]      Follow up with Pixie Casino, MD On 09/02/2014.   Specialty:  Cardiology   Why:  8:45 am - office visit   Contact information:   [place] [address] [phone]       Follow up with Garret Reddish, MD.   Specialty:  [facility]  Medicine   Why:  Keep prior appointment for 08/17/14. Recommend repeating labs (CBC & BMP).   Contact information:   [place] [address] [phone]        The results of significant diagnostics from this hospitalization (including imaging, microbiology, ancillary and laboratory) are listed below for reference.    Significant Diagnostic Studies: Dg Chest 2 View  08/12/2014   CLINICAL DATA:  Initial evaluation for acute chest pain.  EXAM: CHEST  2 VIEW  COMPARISON:  None.  FINDINGS: Heart size within normal limits. There is tortuosity of the intrathoracic aorta with scattered atherosclerotic calcifications. Mediastinal silhouette within normal limits.  The lungs are normally inflated. No airspace consolidation, pleural effusion, or pulmonary edema is identified. There is no pneumothorax.  No acute osseous abnormality identified.  IMPRESSION: 1. No active cardiopulmonary disease. 2. Tortuous intrathoracic aorta with associated atheromatous disease.   Electronically Signed   By: Jeannine Boga M.D.   On: 08/12/2014 23:55    Microbiology: No results found for this or any previous visit (from the past 240 hour(s)).   Labs: Basic Metabolic Panel:  Recent Labs Lab 08/10/14 0819 08/12/14 2235  08/13/14 0601  NA 139 137  --   K 4.1 3.8  --   CL 106 108  --   CO2 27 22  --   GLUCOSE 103* 116*  --   BUN 16 17  --   CREATININE 1.00 1.56* 0.93  CALCIUM 9.2 9.0  --    Liver Function Tests:  Recent Labs Lab 08/10/14 0819  AST 16  ALT 25  ALKPHOS 45  BILITOT 0.7  PROT 6.6  ALBUMIN 4.0   No results for input(s): LIPASE, AMYLASE in the last 168 hours. No results for input(s): AMMONIA in the last 168 hours. CBC:  Recent Labs Lab 08/10/14 0819 08/12/14 2235 08/13/14 0601  WBC 3.3* 3.9* 3.5*  NEUTROABS 1.7 1.9  --   HGB 14.3 14.7 12.9*  HCT 41.5 41.1 37.0*  MCV 96.3 93.0 93.9  PLT 204.0 187 164   Cardiac Enzymes:  Recent Labs Lab 08/13/14 0601 08/13/14 0947  TROPONINI <0.03  <0.03 <0.03   BNP: BNP (last 3 results)  Recent Labs  08/12/14 2235  BNP 29.6    ProBNP (last 3 results) No results for input(s): PROBNP in the last 8760 hours.  CBG: No results for input(s): GLUCAP in the last 168 hours.    Signed:  Vernell Leep, MD, FACP, FHM. [facility] Pager [phone]  If 7PM-7AM, please contact night-coverage [email or web address] Password [email or web address] 08/13/2014, 11:13 AM

## 2014-08-13 NOTE — Discharge Instructions (Signed)
Acute Kidney Injury Acute kidney injury is a disease in which there is sudden (acute) damage to the kidneys. The kidneys are 2 organs that lie on either side of the spine between the middle of the back and the front of the abdomen. The kidneys:  Remove wastes and extra water from the blood.   Produce important hormones. These help keep bones strong, regulate blood pressure, and help create red blood cells.   Balance the fluids and chemicals in the blood and tissues. A small amount of kidney damage may not cause problems, but a large amount of damage may make it difficult or impossible for the kidneys to work the way they should. Acute kidney injury may develop into long-lasting (chronic) kidney disease. It may also develop into a life-threatening disease called end-stage kidney disease. Acute kidney injury can get worse very quickly, so it should be treated right away. Early treatment may prevent other kidney diseases from developing.  CAUSES   A problem with blood flow to the kidneys. This may be caused by:   Blood loss.   Heart disease.   Severe burns.   Liver disease.  Direct damage to the kidneys. This may be caused by:  Some medicines.   A kidney infection.   Poisoning or consuming toxic substances.   A surgical wound.   A blow to the kidney area.   A problem with urine flow. This may be caused by:   Cancer.   Kidney stones.   An enlarged prostate. SYMPTOMS   Swelling (edema) of the legs, ankles, or feet.   Tiredness (lethargy).   Nausea or vomiting.   Confusion.   Problems with urination, such as:   Painful or burning feeling during urination.   Decreased urine production.   Frequent accidents in children who are potty trained.   Bloody urine.   Muscle twitches and cramps.   Shortness of breath.   Seizures.   Chest pain or pressure. Sometimes, no symptoms are present. DIAGNOSIS Acute kidney injury may be detected  and diagnosed by tests, including blood, urine, imaging, or kidney biopsy tests.  TREATMENT Treatment of acute kidney injury varies depending on the cause and severity of the kidney damage. In mild cases, no treatment may be needed. The kidneys may heal on their own. If acute kidney injury is more severe, your caregiver will treat the cause of the kidney damage, help the kidneys heal, and prevent complications from occurring. Severe cases may require a procedure to remove toxic wastes from the body (dialysis) or surgery to repair kidney damage. Surgery may involve:   Repair of a torn kidney.   Removal of an obstruction. Most of the time, you will need to stay overnight at the hospital.  HOME CARE INSTRUCTIONS:  Follow your prescribed diet.  Only take over-the-counter or prescription medicines as directed by your caregiver.  Do not take any new medicines (prescription, over-the-counter, or nutritional supplements) unless approved by your caregiver. Many medicines can worsen your kidney damage or need to have the dose adjusted.   Keep all follow-up appointments as directed by your caregiver.  Observe your condition to make sure you are healing as expected. SEEK IMMEDIATE MEDICAL CARE IF:  You are feeling ill or have severe pain in the back or side.   Your symptoms return or you have new symptoms.  You have any symptoms of end-stage kidney disease. These include:   Persistent itchiness.   Loss of appetite.   Headaches.   Abnormally dark   or light skin.  Numbness in the hands or feet.   Easy bruising.   Frequent hiccups.   Menstruation stops.   You have a fever.  You have increased urine production.  You have pain or bleeding when urinating. MAKE SURE YOU:   Understand these instructions.  Will watch your condition.  Will get help right away if you are not doing well or get worse Document Released: 12/25/2010 Document Revised: 10/06/2012 Document  Reviewed: 02/08/2012 [employer] Patient Information 2015 [employer]. This information is not intended to replace advice given to you by your health care provider. Make sure you discuss any questions you have with your health care provider.  

## 2014-08-13 NOTE — H&P (Signed)
[facility] History and Physical  DAGAN HEINZ VHQ:[id number] DOB: 12/16/1950 DOA: 08/12/2014  Referring physician: Harrell Gave lawyer, PA PCP: Garret Reddish, MD   Chief Complaint: Chest Pain  HPI: Brandon [name] is a 64 y.o. male presents with chest pain. Patient states that he has been having upper chest pain and also has had pain in his arm. Patient states tonight he was at a meeting and developed the same symptoms. He states that he became clammy and was diaphoretic. Patient states there was no shortness of breath noted at this time or during the pain episode. Patient states that he has no headache and no dizziness. He states he may have felt a little faint though. He states that he had something similar in the past. He also admits a history of hypertension. He states that he has no fevers noted. NO cough noted he does note that he has a history of GERD. When he moves his arm he does feel similar pain along his shoulder joint.   Review of Systems:  Constitutional:  No weight loss, night sweats, Fevers, chills, +fatigue.  HEENT:  No headaches, Difficulty swallowing,Tooth/dental problems,Sore throat,  No sneezing Cardio-vascular:  ++chest pain, no Orthopnea, PND, swelling in lower extremities, anasarca, dizziness GI:  ++heartburn, ++indigestion, no abdominal pain, nausea, vomiting, diarrhea  Resp:  No shortness of breath with exertion or at rest. no cough  Skin:  no rash or lesions.  GU:  no dysuria, change in color of urine, no urgency or frequency.  Musculoskeletal:  No joint pain or swelling. No decreased range of motion Psych:  No change in mood or affect. No depression or anxiety. No memory loss.   Past Medical History  Diagnosis Date  . NEOPLASM, MALIGNANT, SKIN, BACK 02/09/2009  . HYPERLIPIDEMIA 02/04/2007  . Chronic prostatitis 12/17/2008  . ERECTILE DYSFUNCTION 02/07/2007  . HYPERTENSION 02/04/2007  . Diverticulosis 01/16/2011    History of diverticulitis     Past Surgical History  Procedure Laterality Date  . None     Social History:  reports that he has never smoked. He does not have any smokeless tobacco history on file. He reports that he drinks about 1.8 oz of alcohol per week. He reports that he does not use illicit drugs.  No Known Allergies  Family History  Problem Relation Age of Onset  . Adopted: Yes  . Breast cancer Mother   . Alzheimer's disease Father     late 63s. mid 81s  . Asperger's syndrome Son   . CVA Mother     quit taking HTN meds     Prior to Admission medications   Medication Sig Start Date End Date Taking? Authorizing Provider  aspirin 81 MG tablet Take 81 mg by mouth daily.      Historical Provider, MD  atenolol (TENORMIN) 50 MG tablet Take 1 tablet (50 mg total) by mouth daily. 08/17/13   Ricard Dillon, MD  atovaquone-proguanil (MALARONE) 250-100 MG TABS Take 1 tablet 1-2 days prior to leaving then once daily during stay then continue 7 days after returning 02/02/14   Ricard Dillon, MD  folic acid (FOLVITE) 413 MCG tablet Take 1 tablet (400 mcg total) by mouth daily. 08/15/12   Ricard Dillon, MD  Multiple Vitamin (MULTIVITAMIN) capsule Take 1 capsule by mouth daily.      Historical Provider, MD  omeprazole (PRILOSEC OTC) 20 MG tablet take 1 tablet by mouth once daily 11/02/13   Ricard Dillon, MD  sildenafil (REVATIO)  20 MG tablet Take 2 tablets (40 mg total) by mouth daily as needed. 11/13/13   Ricard Dillon, MD  simvastatin (ZOCOR) 40 MG tablet TAKE 1 TABLET BY MOUTH ONCE DAILY 02/02/14   Ricard Dillon, MD   Physical Exam: Filed Vitals:   08/12/14 2220 08/12/14 2300  BP: 159/101 149/95  Pulse: 79 73  Temp: 98.4 F (36.9 C)   TempSrc: Oral   Resp: 16 15  Height: 6' (1.829 m)   Weight: 99.791 kg (220 lb)   SpO2: 97% 99%    Wt Readings from Last 3 Encounters:  08/12/14 99.791 kg (220 lb)  05/03/14 105.688 kg (233 lb)  11/02/13 97.977 kg (216 lb)    General:  Appears calm and comfortable Eyes:  PERRL, normal lids, irises & conjunctiva ENT: grossly normal hearing, lips & tongue Neck: no LAD, masses or thyromegaly Cardiovascular: RRR, no m/r/g. No LE edema. Respiratory: CTA bilaterally, no w/r/r. Normal respiratory effort. Abdomen: soft, ntnd Skin: no rash or induration seen on limited exam Musculoskeletal: grossly normal tone BUE/BLE Psychiatric: grossly normal mood and affect, speech fluent and appropriate Neurologic: grossly non-focal.          Labs on Admission:  Basic Metabolic Panel:  Recent Labs Lab 08/10/14 0819 08/12/14 2235  NA 139 137  K 4.1 3.8  CL 106 108  CO2 27 22  GLUCOSE 103* 116*  BUN 16 17  CREATININE 1.00 1.56*  CALCIUM 9.2 9.0   Liver Function Tests:  Recent Labs Lab 08/10/14 0819  AST 16  ALT 25  ALKPHOS 45  BILITOT 0.7  PROT 6.6  ALBUMIN 4.0   No results for input(s): LIPASE, AMYLASE in the last 168 hours. No results for input(s): AMMONIA in the last 168 hours. CBC:  Recent Labs Lab 08/10/14 0819 08/12/14 2235  WBC 3.3* 3.9*  NEUTROABS 1.7 1.9  HGB 14.3 14.7  HCT 41.5 41.1  MCV 96.3 93.0  PLT 204.0 187   Cardiac Enzymes: No results for input(s): CKTOTAL, CKMB, CKMBINDEX, TROPONINI in the last 168 hours.  BNP (last 3 results)  Recent Labs  08/12/14 2235  BNP 29.6    ProBNP (last 3 results) No results for input(s): PROBNP in the last 8760 hours.  CBG: No results for input(s): GLUCAP in the last 168 hours.  Radiological Exams on Admission: Dg Chest 2 View  08/12/2014   CLINICAL DATA:  Initial evaluation for acute chest pain.  EXAM: CHEST  2 VIEW  COMPARISON:  None.  FINDINGS: Heart size within normal limits. There is tortuosity of the intrathoracic aorta with scattered atherosclerotic calcifications. Mediastinal silhouette within normal limits.  The lungs are normally inflated. No airspace consolidation, pleural effusion, or pulmonary edema is identified. There is no pneumothorax.  No acute osseous abnormality  identified.  IMPRESSION: 1. No active cardiopulmonary disease. 2. Tortuous intrathoracic aorta with associated atheromatous disease.   Electronically Signed   By: Jeannine Boga M.D.   On: 08/12/2014 23:55      Assessment/Plan Principal Problem:   Chest pain Active Problems:   Hyperlipidemia   Essential hypertension   GERD (gastroesophageal reflux disease)   1. Chest Pain -will admit for observation -check serial enzymes -will get echo in am -ASA given -already on beta blockers -will need cardiology evaluation  2. Hyperlipidemia -continue with statin -monitor labs  3. HTN -monitor pressures -will continue with home medications  4. GERD -will place on prilosec -GI cocktail as needed  5. Elevated creatinine -he did state that  he did not drink much fluids today -hydrate as tolerated -follow up labs  6. Elevated glucose -will check A1C   Code Status: Full Code(must indicate code status--if unknown or must be presumed, indicate so) DVT Prophylaxis:Heparin Family Communication: Wife (indicate person spoken with, if applicable, with phone number if by telephone) Disposition Plan: Home (indicate anticipated LOS)  Time spent: 57min  KHAN,SAADAT A [facility] Pager [phone]

## 2014-08-14 LAB — HEMOGLOBIN A1C
Hgb A1c MFr Bld: 5.9 % — ABNORMAL HIGH (ref 4.8–5.6)
MEAN PLASMA GLUCOSE: 123 mg/dL

## 2014-08-16 ENCOUNTER — Encounter: Payer: Self-pay | Admitting: Family Medicine

## 2014-08-16 DIAGNOSIS — T380X5A Adverse effect of glucocorticoids and synthetic analogues, initial encounter: Secondary | ICD-10-CM

## 2014-08-16 DIAGNOSIS — R739 Hyperglycemia, unspecified: Secondary | ICD-10-CM | POA: Insufficient documentation

## 2014-08-17 ENCOUNTER — Ambulatory Visit (INDEPENDENT_AMBULATORY_CARE_PROVIDER_SITE_OTHER): Payer: BC Managed Care – PPO | Admitting: Family Medicine

## 2014-08-17 ENCOUNTER — Encounter: Payer: Self-pay | Admitting: Family Medicine

## 2014-08-17 VITALS — BP 120/82 | Temp 98.1°F | Wt 225.0 lb

## 2014-08-17 DIAGNOSIS — Z0000 Encounter for general adult medical examination without abnormal findings: Secondary | ICD-10-CM

## 2014-08-17 DIAGNOSIS — R6883 Chills (without fever): Secondary | ICD-10-CM

## 2014-08-17 LAB — CBC WITH DIFFERENTIAL/PLATELET
BASOS PCT: 0.4 % (ref 0.0–3.0)
Basophils Absolute: 0 10*3/uL (ref 0.0–0.1)
Eosinophils Absolute: 0 10*3/uL (ref 0.0–0.7)
Eosinophils Relative: 0.9 % (ref 0.0–5.0)
HCT: 42.3 % (ref 39.0–52.0)
HEMOGLOBIN: 14.8 g/dL (ref 13.0–17.0)
LYMPHS ABS: 1.1 10*3/uL (ref 0.7–4.0)
Lymphocytes Relative: 34.7 % (ref 12.0–46.0)
MCHC: 35 g/dL (ref 30.0–36.0)
MCV: 95.8 fl (ref 78.0–100.0)
MONO ABS: 0.2 10*3/uL (ref 0.1–1.0)
Monocytes Relative: 5.8 % (ref 3.0–12.0)
Neutro Abs: 1.8 10*3/uL (ref 1.4–7.7)
Neutrophils Relative %: 58.2 % (ref 43.0–77.0)
PLATELETS: 203 10*3/uL (ref 150.0–400.0)
RBC: 4.42 Mil/uL (ref 4.22–5.81)
RDW: 13.3 % (ref 11.5–15.5)
WBC: 3.1 10*3/uL — AB (ref 4.0–10.5)

## 2014-08-17 LAB — BASIC METABOLIC PANEL
BUN: 13 mg/dL (ref 6–23)
CHLORIDE: 105 meq/L (ref 96–112)
CO2: 25 meq/L (ref 19–32)
Calcium: 9.5 mg/dL (ref 8.4–10.5)
Creatinine, Ser: 1.01 mg/dL (ref 0.40–1.50)
GFR: 78.8 mL/min (ref >60.00)
Glucose, Bld: 128 mg/dL — ABNORMAL HIGH (ref 70–99)
Potassium: 3.9 mEq/L (ref 3.5–5.1)
Sodium: 137 mEq/L (ref 135–145)

## 2014-08-17 MED ORDER — CIPROFLOXACIN HCL 500 MG PO TABS: 500.0000 mg | ORAL_TABLET | Freq: Two times a day (BID) | ORAL | Status: DC

## 2014-08-17 NOTE — Progress Notes (Signed)
Brandon Reddish, MD Phone: [phone]  Subjective:  Patient presents today for their annual physical. Chief complaint-noted.   Multiple concerns including Hernia L lower abdomen that comes and goes but cannot get it to bulge today. He is going to [place] for cruise and wants cipro for traveler's diarrhea. We discussed his labs before his physical with concerns of hyperglycemia and need for weight loss. Discussed low WBC which may have been due to viral illness leading to hospitalization for fatigue and chest pain now greatly improved. He has upcoming stress test and is going to hold off on exercise until that time which I agreed was reasonable.   PSA is up over a point from a year ago but only .8 from 5 years ago. We discussed repeat vs. Urology referral vs antibiotics and opted for 3 month repeat. Other than recent illness, he has done well and denies urinary symptoms  ROS- full  review of systems was completed and negative except for: recent chest/shoulder pain evaluated in ED as well as chills.   The following were reviewed and entered/updated in epic: Past Medical History  Diagnosis Date  . NEOPLASM, MALIGNANT, SKIN, BACK 02/09/2009  . HYPERLIPIDEMIA 02/04/2007  . Chronic prostatitis 12/17/2008  . ERECTILE DYSFUNCTION 02/07/2007  . HYPERTENSION 02/04/2007  . Diverticulosis 01/16/2011    History of diverticulitis    Patient Active Problem List   Diagnosis Date Noted  . Hyperglycemia 08/16/2014    Priority: Medium  . Hyperlipidemia 02/04/2007    Priority: Medium  . Essential hypertension 02/04/2007    Priority: Medium  . GERD (gastroesophageal reflux disease) 05/03/2014    Priority: Low  . Cervical disc disorder with radiculopathy of cervical region 07/14/2010    Priority: Low  . MIXED HEARING LOSS BILATERAL 07/14/2010    Priority: Low  . ERECTILE DYSFUNCTION 02/07/2007    Priority: Low  . Chest pain 08/13/2014   Past Surgical History  Procedure Laterality Date  . None       Family History  Problem Relation Age of Onset  . Breast cancer Mother   . Alzheimer's disease Father     late 23s. mid 59s  . Asperger's syndrome Son   . CVA Mother     quit taking HTN meds    Medications- reviewed and updated Current Outpatient Prescriptions  Medication Sig Dispense Refill  . aspirin 81 MG tablet Take 81 mg by mouth daily.      Marland Kitchen atenolol (TENORMIN) 50 MG tablet Take 1 tablet (50 mg total) by mouth daily. 90 tablet 3  . folic acid (FOLVITE) 841 MCG tablet Take 1 tablet (400 mcg total) by mouth daily. 30 tablet 1  . Multiple Vitamin (MULTIVITAMIN) capsule Take 1 capsule by mouth daily.      Marland Kitchen omeprazole (PRILOSEC OTC) 20 MG tablet take 1 tablet by mouth once daily 90 tablet 3  . sildenafil (REVATIO) 20 MG tablet Take 2 tablets (40 mg total) by mouth daily as needed. (Patient taking differently: Take 40 mg by mouth daily as needed (erectile dysfunction). ) 60 tablet 5  . simvastatin (ZOCOR) 40 MG tablet TAKE 1 TABLET BY MOUTH ONCE DAILY 90 tablet 2  . Sodium Fluoride (PREVIDENT 5000 BOOSTER PLUS DT) Place 1 application onto teeth 2 (two) times daily.    Marland Kitchen acetaminophen (TYLENOL) 325 MG tablet Take 2 tablets (650 mg total) by mouth every 4 (four) hours as needed for mild pain, moderate pain or headache. (Patient not taking: Reported on 08/17/2014)    .  ciprofloxacin (CIPRO) 500 MG tablet Take 1 tablet (500 mg total) by mouth 2 (two) times daily. For traveler's diarrhea 6 tablet 0   No current facility-administered medications for this visit.    Allergies-reviewed and updated No Known Allergies  History   Social History  . Marital Status: Married    Spouse Name: N/A  . Number of Children: N/A  . Years of Education: N/A   Social History Main Topics  . Smoking status: Never Smoker   . Smokeless tobacco: Not on file  . Alcohol Use: 1.8 oz/week    3 Standard drinks or equivalent per week  . Drug Use: No  . Sexual Activity: Yes   Other Topics Concern  .  None   Social History Narrative   Married 1973. Wife has Parkinsons. 2 daughters, 1 son (middle). 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 Oropharynx normal. TM normal Neck: no thyromegaly, no lymphadenopathy CV: RRR no murmurs rubs or gallops Lungs: CTAB no crackles, wheeze, rhonchi Abdomen: soft/nontender/nondistended/normal bowel sounds. No rebound or guarding.  GU: no nodules on prostate, mild enlargement Ext: no edema Skin: warm, dry, no rash  Neuro: grossly normal, moves all extremities, PERRLA  Assessment/Plan:  64 y.o. male presenting for annual physical.  Health Maintenance counseling: 1. Anticipatory guidance: Patient counseled regarding regular dental exams, wearing seatbelts, wearing sunscreen, regular eye exams.  2. Risk factor reduction:  Advised patient of need for regular exercise (hold off until stress test) and diet rich and fruits and vegetables to reduce risk of heart attack and stroke.  3. Immunizations/screenings/ancillary studies- up to date.  4. Appeared to have viral infection leading to hospitalization for chills/fatigue/chest pain now improving. WBC today remains low, we will plan for repeat in 3 months or sooner if new or worsening symptoms-would follow up with potential testing for leukopenia if remains low such as HIV. AKI in hospital has resolved on labs today as well.   Return precautions advised. 3 months for repeat PSA (elevated) and CBC (leukopenia)  Results for orders placed or performed in visit on 08/17/14 (from the past 24 hour(s))  CBC with Differential/Platelet     Status: Abnormal   Collection Time: 08/17/14  9:52 AM  Result Value Ref Range   WBC 3.1 (L) 4.0 -  10.5 K/uL   RBC 4.42 4.22 - 5.81 Mil/uL   Hemoglobin 14.8 13.0 - 17.0 g/dL   HCT 42.3 39.0 - 52.0 %   MCV 95.8 78.0 - 100.0 fl   MCHC 35.0 30.0 - 36.0 g/dL   RDW 13.3 11.5 - 15.5 %   Platelets 203.0 150.0 - 400.0 K/uL   Neutrophils Relative % 58.2 43.0 - 77.0 %   Lymphocytes Relative 34.7 12.0 - 46.0 %   Monocytes Relative 5.8 3.0 - 12.0 %   Eosinophils Relative 0.9 0.0 - 5.0 %   Basophils Relative 0.4 0.0 - 3.0 %   Neutro Abs 1.8 1.4 - 7.7 K/uL   Lymphs Abs 1.1 0.7 - 4.0 K/uL   Monocytes Absolute 0.2 0.1 - 1.0 K/uL   Eosinophils Absolute 0.0 0.0 - 0.7 K/uL   Basophils Absolute 0.0 0.0 - 0.1 K/uL  Basic metabolic panel  Status: Abnormal   Collection Time: 08/17/14  9:52 AM  Result Value Ref Range   Sodium 137 135 - 145 mEq/L   Potassium 3.9 3.5 - 5.1 mEq/L   Chloride 105 96 - 112 mEq/L   CO2 25 19 - 32 mEq/L   Glucose, Bld 128 (H) 70 - 99 mg/dL   BUN 13 6 - 23 mg/dL   Creatinine, Ser 1.01 0.40 - 1.50 mg/dL   Calcium 9.5 8.4 - 10.5 mg/dL   GFR 78.80 >60.00 mL/min    Meds ordered this encounter  Medications  . ciprofloxacin (CIPRO) 500 MG tablet    Sig: Take 1 tablet (500 mg total) by mouth 2 (two) times daily. For traveler's diarrhea    Dispense:  6 tablet    Refill:  0

## 2014-08-17 NOTE — Patient Instructions (Addendum)
I think you had a virus causing many of your symptoms. Recheck labs today.   See me in 3 months, I would plan on repeating PSA when I see you.   Agree weight loss is needed. 200 lbs is reasonable goal (waiting until after stress test to kick the exercise in gear but I think that is going to be fine).

## 2014-08-25 ENCOUNTER — Telehealth: Payer: Self-pay | Admitting: *Deleted

## 2014-08-25 ENCOUNTER — Telehealth (HOSPITAL_COMMUNITY): Payer: Self-pay

## 2014-08-25 NOTE — Telephone Encounter (Signed)
Pt had called, referred to me by Nuclear Medicine. Has upcoming stress test.  He wanted to see if we needed/could get medical records from his previous cardiologist.  Cardiologist was Dr. Billey Chang w/ [facility].  Address listed is:  [facility] [address] [name] [address]  Ph: [phone]  I did call this number and receptionist informed me that they do not have patient's info on file.  Called back patient to inform.  Patient was only seen there once as he reports. May only be archived paper chart on-hand at historical cardiologist. I explained this.  He is going to inquire to his previous PCP's office to see if they have copy of his workup on-hand.

## 2014-08-25 NOTE — Telephone Encounter (Signed)
Encounter complete. 

## 2014-08-27 ENCOUNTER — Ambulatory Visit (HOSPITAL_COMMUNITY)
Admit: 2014-08-27 | Discharge: 2014-08-27 | Disposition: A | Discharge status: Home / Self Care | Payer: Medicare Other | Source: Ambulatory Visit | Attending: Cardiovascular Disease | Admitting: Cardiovascular Disease

## 2014-08-27 DIAGNOSIS — R079 Chest pain, unspecified: Secondary | ICD-10-CM | POA: Diagnosis not present

## 2014-08-27 DIAGNOSIS — R002 Palpitations: Secondary | ICD-10-CM | POA: Diagnosis not present

## 2014-08-27 DIAGNOSIS — I10 Essential (primary) hypertension: Secondary | ICD-10-CM | POA: Insufficient documentation

## 2014-08-27 DIAGNOSIS — E663 Overweight: Secondary | ICD-10-CM | POA: Insufficient documentation

## 2014-08-27 DIAGNOSIS — Z8249 Family history of ischemic heart disease and other diseases of the circulatory system: Secondary | ICD-10-CM | POA: Insufficient documentation

## 2014-08-27 DIAGNOSIS — E785 Hyperlipidemia, unspecified: Secondary | ICD-10-CM | POA: Diagnosis not present

## 2014-08-27 MED ORDER — TECHNETIUM TC 99M SESTAMIBI GENERIC - CARDIOLITE
30.6000 | Freq: Once | INTRAVENOUS | Status: AC | PRN
  Administered 2014-08-27: 31 via INTRAVENOUS

## 2014-08-27 MED ORDER — TECHNETIUM TC 99M SESTAMIBI GENERIC - CARDIOLITE
10.1000 | Freq: Once | INTRAVENOUS | Status: AC | PRN
  Administered 2014-08-27: 10.1 via INTRAVENOUS

## 2014-08-27 NOTE — Procedures (Addendum)
[place] [address] [place] [address] [phone]  Cardiology Nuclear Med Study  Brandon [name] is a 64 y.o. male     MRN : [id number]     DOB: 01-21-1951  Procedure Date: 08/27/2014  Nuclear Med Background Indication for Stress Test:  Evaluation for Ischemia and [facility] History:  No prior cardiac or respiratory history reported;DVT Cardiac Risk Factors: Family History - CAD, Hypertension, Lipids and Overweight  Symptoms:  Chest Pain and Palpitations   Nuclear Pre-Procedure Caffeine/Decaff Intake:  12:30am NPO After: 8:30am   IV Site: R Forearm  IV 0.9% NS with Angio Cath:  22g  Chest Size (in):  48"  IV Started by: Rolene Course, RN  Height: 6' (1.829 m)  Cup Size: n/a  BMI:  Body mass index is 30.51 kg/(m^2). Weight:  225 lb (102.059 kg)   Tech Comments:  n/a    Nuclear Med Study 1 or 2 day study: 1 day  Stress Test Type:  Stress  Order Authorizing Provider:  Lyman Bishop, MD   Resting Radionuclide: Technetium 12m Sestamibi  Resting Radionuclide Dose: 10.1 mCi   Stress Radionuclide:  Technetium 13m Sestamibi  Stress Radionuclide Dose: 30.6 mCi           Stress Protocol Rest HR: 78 Stress HR: 150  Rest BP: 137/90 Stress BP: 180/97  Exercise Time (min): 8:30 METS: 10.10   Predicted Max HR: 155 bpm % Max HR: 96.77 bpm Rate Pressure Product: 27000  Dose of Adenosine (mg):  n/a Dose of Lexiscan: n/a mg  Dose of Atropine (mg): n/a Dose of Dobutamine: n/a mcg/kg/min (at max HR)  Stress Test Technologist: Mellody Memos, CCT Nuclear Technologist: Imagene Riches, CNMT   Rest Procedure:  Myocardial perfusion imaging was performed at rest 45 minutes following the intravenous administration of Technetium 74m Sestamibi. Stress Procedure:  The patient performed treadmill exercise using a Bruce  Protocol for 8 minutes 30 seconds. The patient stopped due to shortness of breath. Patient denied any chest pain.   There were significant ST-T wave changes.  Technetium 11m Sestamibi was injected at peak exercise and myocardial perfusion imaging was performed after a brief delay.  Transient Ischemic Dilatation (Normal <1.22):  0.88  QGS EDV:  73 ml QGS ESV:  26 ml LV Ejection Fraction: 65%        Rest ECG: NSR with non-specific ST-T wave changes  Stress ECG: No significant ST segment change suggestive of ischemia.  QPS Raw Data Images:  Normal; no motion artifact; normal heart/lung ratio. Stress Images:  Normal homogeneous uptake in all areas of the myocardium. Rest Images:  Normal homogeneous uptake in all areas of the myocardium. Subtraction (SDS):  No evidence of ischemia.  Impression Exercise Capacity:  Good exercise capacity. BP Response:  Normal blood pressure response. Clinical Symptoms:  No significant symptoms noted. ECG Impression:  No significant ST segment change suggestive of ischemia. Comparison with Prior Nuclear Study: No images to compare  Overall Impression:  Normal stress nuclear study.  LV Wall Motion:  NL LV Function; NL Wall Motion   Lorretta Harp, MD  08/27/2014 6:05 PM

## 2014-09-02 ENCOUNTER — Ambulatory Visit (INDEPENDENT_AMBULATORY_CARE_PROVIDER_SITE_OTHER): Payer: Medicare Other | Admitting: Internal Medicine

## 2014-09-02 ENCOUNTER — Encounter: Payer: Self-pay | Admitting: Internal Medicine

## 2014-09-02 VITALS — BP 108/62 | HR 58 | Ht 72.0 in | Wt 220.3 lb

## 2014-09-02 DIAGNOSIS — M5010 Cervical disc disorder with radiculopathy, unspecified cervical region: Secondary | ICD-10-CM | POA: Diagnosis not present

## 2014-09-02 DIAGNOSIS — E785 Hyperlipidemia, unspecified: Secondary | ICD-10-CM

## 2014-09-02 DIAGNOSIS — I10 Essential (primary) hypertension: Secondary | ICD-10-CM

## 2014-09-02 DIAGNOSIS — R0789 Other chest pain: Secondary | ICD-10-CM | POA: Diagnosis not present

## 2014-09-02 NOTE — Progress Notes (Signed)
OFFICE NOTE  Chief Complaint:  Follow-up stress test  Primary Care Physician: Garret Reddish, MD  HPI:  Brandon [name] is a 64 y.o. male with a past medical history significant for hypertension and dyslipidemia. He is a lifelong nonsmoker. He reports over the past day he's felt unwell. He said yesterday he felt somewhat fatigued during the day and clammy. He went to a meeting and felt a little bit hot and diaphoretic. He thought he felt a little bit of tingling in the left arm. He denied any chest pain or worsening shortness of breath. He went to bed and then started having more symptoms again after they had subsided and felt that he should come to the emergency room. Although the emergency department indicated that he had chest pain and shortness of breath, he vehemently denies that today. EKG demonstrated sinus rhythm with nonspecific ST and T-wave abnormalities. Today his EKG has normalized. It should be noted that on admission he was in acute renal failure with a creatinine of 1.56, with a baseline creatinine of 1. His creatinine today is now back to 0.93 after hydration overnight. Troponin is negative and BNP was 29.6. Lipid profile shows good control with LDL cholesterol 79 and total cholesterol 146. H&H is 13 and 37 today down from 15 and 41, presumably due to hydration. Chest x-ray shows a tortuous thoracic aorta with some atherosclerotic disease. My suspicion in the hospital was that his symptoms could be related to a viral infection. He reports that after a few days his clamminess and diaphoresis improved. He still has some persistent left shoulder and neck pain. This may be unrelated cervicalgia. Ultimately underwent an outpatient nuclear stress test which was negative for ischemia and showed a preserved EF of 66%. He reports no further chest pain episodes.  PMHx:  Past Medical History  Diagnosis Date  . NEOPLASM, MALIGNANT, SKIN, BACK 02/09/2009  . HYPERLIPIDEMIA 02/04/2007  .  Chronic prostatitis 12/17/2008  . ERECTILE DYSFUNCTION 02/07/2007  . HYPERTENSION 02/04/2007  . Diverticulosis 01/16/2011    History of diverticulitis     Past Surgical History  Procedure Laterality Date  . None      FAMHx:  Family History  Problem Relation Age of Onset  . Breast cancer Mother   . Alzheimer's disease Father     late 34s. mid 75s  . Asperger's syndrome Son   . CVA Mother     quit taking HTN meds    SOCHx:   reports that he has never smoked. He does not have any smokeless tobacco history on file. He reports that he drinks about 1.8 oz of alcohol per week. He reports that he does not use illicit drugs.  ALLERGIES:  No Known Allergies  ROS: A comprehensive review of systems was negative except for: Musculoskeletal: positive for arthralgias  HOME MEDS: Current Outpatient Prescriptions  Medication Sig Dispense Refill  . acetaminophen (TYLENOL) 325 MG tablet Take 2 tablets (650 mg total) by mouth every 4 (four) hours as needed for mild pain, moderate pain or headache.    Marland Kitchen aspirin 81 MG tablet Take 81 mg by mouth daily.      Marland Kitchen atenolol (TENORMIN) 50 MG tablet Take 1 tablet (50 mg total) by mouth daily. 90 tablet 3  . folic acid (FOLVITE) 765 MCG tablet Take 1 tablet (400 mcg total) by mouth daily. 30 tablet 1  . Multiple Vitamin (MULTIVITAMIN) capsule Take 1 capsule by mouth daily.      Marland Kitchen omeprazole (  PRILOSEC OTC) 20 MG tablet take 1 tablet by mouth once daily 90 tablet 3  . sildenafil (REVATIO) 20 MG tablet Take 2 tablets (40 mg total) by mouth daily as needed. (Patient taking differently: Take 40 mg by mouth daily as needed (erectile dysfunction). ) 60 tablet 5  . simvastatin (ZOCOR) 40 MG tablet TAKE 1 TABLET BY MOUTH ONCE DAILY 90 tablet 2  . Sodium Fluoride (PREVIDENT 5000 BOOSTER PLUS DT) Place 1 application onto teeth 2 (two) times daily.     No current facility-administered medications for this visit.    LABS/IMAGING: No results found for this or any  previous visit (from the past 48 hour(s)). No results found.  VITALS: BP 108/62 mmHg  Pulse 58  Ht 6' (1.829 m)  Wt 220 lb 4.8 oz (99.927 kg)  BMI 29.87 kg/m2  EXAM: General appearance: alert and no distress Neck: no carotid bruit and no JVD Lungs: clear to auscultation bilaterally Heart: regular rate and rhythm, S1, S2 normal, no murmur, click, rub or gallop Abdomen: soft, non-tender; bowel sounds normal; no masses,  no organomegaly Extremities: extremities normal, atraumatic, no cyanosis or edema Pulses: 2+ and symmetric Skin: Skin color, texture, turgor normal. No rashes or lesions Neurologic: Grossly normal Psych: Pleasant  EKG: Deferred  ASSESSMENT: 1. Noncardiac chest pain 2. Left shoulder pain may be related to impingement or cervicalgia 3. Atherosclerosis and presumed coronary disease 4. Dyslipidemia 5. Hypertension-controlled  PLAN: 1.   Mr. Crager had a negative nuclear stress test which is reassuring. He's had no further chest pain symptoms. I think at this point it's okay for him to start exercising and work on weight loss and dietary changes which we discussed at some length today. He should remain on a statin as he does have atherosclerosis based on a CT imaging of the chest. His blood pressure is well-controlled on his current medications and can easily be followed by his primary care provider. I'm happy to see him back on an as-needed basis.   Pixie Casino, MD, [facility] Attending Cardiologist [facility]  Viann Nielson C 09/02/2014, 9:35 AM

## 2014-09-02 NOTE — Patient Instructions (Signed)
Your physician recommends that you schedule a follow-up appointment as needed  

## 2014-09-13 ENCOUNTER — Other Ambulatory Visit: Payer: Self-pay

## 2014-09-13 DIAGNOSIS — I10 Essential (primary) hypertension: Secondary | ICD-10-CM

## 2014-09-13 MED ORDER — ATENOLOL 50 MG PO TABS: 50.0000 mg | ORAL_TABLET | Freq: Every day | ORAL | Status: DC

## 2014-09-13 NOTE — Telephone Encounter (Signed)
Rx request for Atenolol 50 mg tablet-Take 1 tablet by mouth daily #90  Pharm;  [facility]

## 2014-10-28 ENCOUNTER — Encounter: Payer: Self-pay | Admitting: Family Medicine

## 2014-10-28 ENCOUNTER — Telehealth: Payer: Self-pay

## 2014-10-28 DIAGNOSIS — D72819 Decreased white blood cell count, unspecified: Secondary | ICD-10-CM

## 2014-10-28 DIAGNOSIS — R972 Elevated prostate specific antigen [PSA]: Secondary | ICD-10-CM

## 2014-10-28 NOTE — Telephone Encounter (Signed)
Yes plus add an HIV for leukopenia. Thanks!

## 2014-10-28 NOTE — Telephone Encounter (Signed)
Labs entered.

## 2014-10-29 ENCOUNTER — Encounter: Payer: Self-pay | Admitting: Gastroenterology

## 2014-11-04 ENCOUNTER — Other Ambulatory Visit (INDEPENDENT_AMBULATORY_CARE_PROVIDER_SITE_OTHER): Payer: Medicare Other

## 2014-11-04 DIAGNOSIS — D72819 Decreased white blood cell count, unspecified: Secondary | ICD-10-CM

## 2014-11-04 DIAGNOSIS — R972 Elevated prostate specific antigen [PSA]: Secondary | ICD-10-CM

## 2014-11-04 LAB — CBC
HEMATOCRIT: 41.2 % (ref 39.0–52.0)
Hemoglobin: 14.3 g/dL (ref 13.0–17.0)
MCHC: 34.7 g/dL (ref 30.0–36.0)
MCV: 96.3 fl (ref 78.0–100.0)
Platelets: 224 10*3/uL (ref 150.0–400.0)
RBC: 4.28 Mil/uL (ref 4.22–5.81)
RDW: 14.3 % (ref 11.5–15.5)
WBC: 3.1 10*3/uL — AB (ref 4.0–10.5)

## 2014-11-04 LAB — PSA: PSA: 3.11 ng/mL (ref 0.10–4.00)

## 2014-11-05 LAB — HIV ANTIBODY (ROUTINE TESTING W REFLEX): HIV 1&2 Ab, 4th Generation: NONREACTIVE

## 2014-11-15 ENCOUNTER — Ambulatory Visit (INDEPENDENT_AMBULATORY_CARE_PROVIDER_SITE_OTHER): Payer: Medicare Other | Admitting: Family Medicine

## 2014-11-15 ENCOUNTER — Encounter: Payer: Self-pay | Admitting: Family Medicine

## 2014-11-15 VITALS — BP 112/64 | HR 60 | Temp 98.2°F | Wt 222.0 lb

## 2014-11-15 DIAGNOSIS — I10 Essential (primary) hypertension: Secondary | ICD-10-CM

## 2014-11-15 DIAGNOSIS — E785 Hyperlipidemia, unspecified: Secondary | ICD-10-CM | POA: Diagnosis not present

## 2014-11-15 DIAGNOSIS — N401 Enlarged prostate with lower urinary tract symptoms: Secondary | ICD-10-CM | POA: Insufficient documentation

## 2014-11-15 DIAGNOSIS — D72819 Decreased white blood cell count, unspecified: Secondary | ICD-10-CM | POA: Diagnosis not present

## 2014-11-15 DIAGNOSIS — R351 Nocturia: Secondary | ICD-10-CM

## 2014-11-15 NOTE — Patient Instructions (Signed)
Schedule annual wellness visit in 6 months. Fill out forms a week before and bring them with you.   Schedule a lab visit at the front desk for a week before your annual wellness visit. Return for future fasting labs. Nothing but water after midnight please.    See if your insurance will cover an annual physical-if they will, we will plan on one sometime after 08/18/15.

## 2014-11-15 NOTE — Progress Notes (Signed)
Garret Reddish, MD  Subjective:  Brandon [name] is a 64 y.o. year old very pleasant male patient who presents with:  Hyperlipidemia-controlled  Lab Results  Component Value Date   LDLCALC 79 08/10/2014   On statin: simvastatin 40mg  ROS- no chest pain or shortness of breath. No myalgias  Leukopenia- stable Almost all #s since 2011 with mild decrease in white count. HIV negative last visit. May be trending slightly down but never below 3.  ROS- no fever, chills, night sweats, worsening fatigue  BPH with nocturia - stable Elevated PSA.  Nocturia stable once a night.  No rx ROS- denies unintentional weight loss.   Hypertension-controlled  BP Readings from Last 3 Encounters:  11/15/14 112/64  09/02/14 108/62  08/17/14 120/82  ROS-Denies any CP, HA, SOB, blurry vision, LE edema  Past Medical History- hyperglycemia with a1c 5.9 early 2016, ED, GERD  Medications- reviewed and updated Current Outpatient Prescriptions  Medication Sig Dispense Refill  . aspirin 81 MG tablet Take 81 mg by mouth daily.      Marland Kitchen atenolol (TENORMIN) 50 MG tablet Take 1 tablet (50 mg total) by mouth daily. 90 tablet 3  . folic acid (FOLVITE) 768 MCG tablet Take 1 tablet (400 mcg total) by mouth daily. 30 tablet 1  . Multiple Vitamin (MULTIVITAMIN) capsule Take 1 capsule by mouth daily.      Marland Kitchen omeprazole (PRILOSEC OTC) 20 MG tablet take 1 tablet by mouth once daily 90 tablet 3  . simvastatin (ZOCOR) 40 MG tablet TAKE 1 TABLET BY MOUTH ONCE DAILY 90 tablet 2  . Sodium Fluoride (PREVIDENT 5000 BOOSTER PLUS DT) Place 1 application onto teeth 2 (two) times daily.    Marland Kitchen acetaminophen (TYLENOL) 325 MG tablet Take 2 tablets (650 mg total) by mouth every 4 (four) hours as needed for mild pain, moderate pain or headache. (Patient not taking: Reported on 11/15/2014)    . sildenafil (REVATIO) 20 MG tablet Take 2 tablets (40 mg total) by mouth daily as needed. (Patient not taking: Reported on 11/15/2014) 60 tablet 5    Objective: BP 112/64 mmHg  Pulse 60  Temp(Src) 98.2 F (36.8 C)  Wt 222 lb (100.699 kg) Gen: NAD, resting comfortably in chair CV: RRR no murmurs rubs or gallops Lungs: CTAB no crackles, wheeze, rhonchi Abdomen: soft/nontender/nondistended/normal bowel sounds. No rebound or guarding. overweight Ext: no edema Rectal: normal tone, diffusely enlarged prostate, no masses or tenderness Skin: warm, dry, no rash  Neuro: grossly normal, moves all extremities   Assessment/Plan:  Hyperlipidemia Controlled, continue simvastatin 40   Leukopenia HIV negative. 3.1-4.2 essentially since 2011. Repeat CBC next visit. Other cell lines stable. Consider peripheral smear. If ever progressive, consider hematology consult. <1% patients with leukopenia on omeprazole, simvastatin, <2% sildenafil per uptodate.    BPH associated with nocturia I suspect elevated PSA from BPH. Recheck this time slightly down from last visit. We will plan on repeat in 6 months with annual wellness visit then consider 6-12 months vs. urolgoy consult.   Essential hypertension Well controlled. Continue current meds: Atenolol 50mg      Return 6 months for AWV. States he has Medicare but not listed in banner. Has BCBS for state listed- not sure if this will cover physical and he will check with insurance-if it does would plan on CPE in 1 year after AWV 6 months.   Return precautions advised.   Future fasting labs before next visit Orders Placed This Encounter  Procedures  . CBC with Differential  Standing Status: Future     Number of Occurrences:      Standing Expiration Date: 11/15/2015  . PSA    Standing Status: Future     Number of Occurrences:      Standing Expiration Date: 11/15/2015  . Comprehensive metabolic panel    [place]    Standing Status: Future     Number of Occurrences:      Standing Expiration Date: 11/15/2015  . LDL cholesterol, direct    [place]    Standing Status: Future     Number of  Occurrences:      Standing Expiration Date: 11/15/2015

## 2014-11-15 NOTE — Assessment & Plan Note (Signed)
I suspect elevated PSA from BPH. Recheck this time slightly down from last visit. We will plan on repeat in 6 months with annual wellness visit then consider 6-12 months vs. urolgoy consult.

## 2014-11-15 NOTE — Assessment & Plan Note (Signed)
HIV negative. 3.1-4.2 essentially since 2011. Repeat CBC next visit. Other cell lines stable. Consider peripheral smear. If ever progressive, consider hematology consult. <1% patients with leukopenia on omeprazole, simvastatin, <2% sildenafil per uptodate.

## 2014-11-15 NOTE — Assessment & Plan Note (Signed)
Controlled, continue simvastatin 40

## 2014-11-15 NOTE — Assessment & Plan Note (Signed)
Well controlled. Continue current meds: Atenolol 50mg 

## 2014-12-28 ENCOUNTER — Other Ambulatory Visit: Payer: Self-pay | Admitting: *Deleted

## 2014-12-28 MED ORDER — OMEPRAZOLE MAGNESIUM 20 MG PO TBEC: DELAYED_RELEASE_TABLET | ORAL | Status: DC

## 2015-01-04 ENCOUNTER — Encounter: Payer: Self-pay | Admitting: Family Medicine

## 2015-01-13 ENCOUNTER — Encounter: Payer: Self-pay | Admitting: Gastroenterology

## 2015-03-16 DIAGNOSIS — Z23 Encounter for immunization: Secondary | ICD-10-CM | POA: Diagnosis not present

## 2015-04-27 ENCOUNTER — Other Ambulatory Visit: Payer: Self-pay | Admitting: Family Medicine

## 2015-04-27 MED ORDER — SIMVASTATIN 40 MG PO TABS: 40.0000 mg | ORAL_TABLET | Freq: Every day | ORAL | Status: DC

## 2015-05-04 ENCOUNTER — Other Ambulatory Visit (INDEPENDENT_AMBULATORY_CARE_PROVIDER_SITE_OTHER): Payer: Medicare Other

## 2015-05-04 DIAGNOSIS — D72819 Decreased white blood cell count, unspecified: Secondary | ICD-10-CM | POA: Diagnosis not present

## 2015-05-04 DIAGNOSIS — I10 Essential (primary) hypertension: Secondary | ICD-10-CM | POA: Diagnosis not present

## 2015-05-04 DIAGNOSIS — N401 Enlarged prostate with lower urinary tract symptoms: Secondary | ICD-10-CM

## 2015-05-04 DIAGNOSIS — E785 Hyperlipidemia, unspecified: Secondary | ICD-10-CM | POA: Diagnosis not present

## 2015-05-04 DIAGNOSIS — R351 Nocturia: Secondary | ICD-10-CM | POA: Diagnosis not present

## 2015-05-04 LAB — CBC WITH DIFFERENTIAL/PLATELET
BASOS ABS: 0 10*3/uL (ref 0.0–0.1)
Basophils Relative: 0.3 % (ref 0.0–3.0)
EOS PCT: 0.8 % (ref 0.0–5.0)
Eosinophils Absolute: 0 10*3/uL (ref 0.0–0.7)
HCT: 43.4 % (ref 39.0–52.0)
Hemoglobin: 14.6 g/dL (ref 13.0–17.0)
Lymphocytes Relative: 46.4 % — ABNORMAL HIGH (ref 12.0–46.0)
Lymphs Abs: 1.7 10*3/uL (ref 0.7–4.0)
MCHC: 33.7 g/dL (ref 30.0–36.0)
MCV: 98.1 fl (ref 78.0–100.0)
MONO ABS: 0.2 10*3/uL (ref 0.1–1.0)
Monocytes Relative: 6 % (ref 3.0–12.0)
Neutro Abs: 1.7 10*3/uL (ref 1.4–7.7)
Neutrophils Relative %: 46.5 % (ref 43.0–77.0)
Platelets: 205 10*3/uL (ref 150.0–400.0)
RBC: 4.43 Mil/uL (ref 4.22–5.81)
RDW: 13.6 % (ref 11.5–15.5)
WBC: 3.7 10*3/uL — AB (ref 4.0–10.5)

## 2015-05-04 LAB — COMPREHENSIVE METABOLIC PANEL
ALT: 28 U/L (ref 0–53)
AST: 18 U/L (ref 0–37)
Albumin: 4.2 g/dL (ref 3.5–5.2)
Alkaline Phosphatase: 49 U/L (ref 39–117)
BUN: 13 mg/dL (ref 6–23)
CO2: 26 meq/L (ref 19–32)
CREATININE: 0.94 mg/dL (ref 0.40–1.50)
Calcium: 9.3 mg/dL (ref 8.4–10.5)
Chloride: 103 mEq/L (ref 96–112)
GFR: 85.42 mL/min (ref >60.00)
Glucose, Bld: 108 mg/dL — ABNORMAL HIGH (ref 70–99)
Potassium: 4.2 mEq/L (ref 3.5–5.1)
Sodium: 138 mEq/L (ref 135–145)
Total Bilirubin: 0.8 mg/dL (ref 0.2–1.2)
Total Protein: 6.6 g/dL (ref 6.0–8.3)

## 2015-05-04 LAB — PSA: PSA: 2.46 ng/mL (ref 0.10–4.00)

## 2015-05-04 LAB — LDL CHOLESTEROL, DIRECT: LDL DIRECT: 92 mg/dL

## 2015-05-11 ENCOUNTER — Encounter: Payer: Self-pay | Admitting: Family Medicine

## 2015-05-11 ENCOUNTER — Ambulatory Visit (INDEPENDENT_AMBULATORY_CARE_PROVIDER_SITE_OTHER): Payer: Medicare Other | Admitting: Family Medicine

## 2015-05-11 VITALS — BP 120/90 | HR 62 | Temp 98.2°F | Ht 72.0 in | Wt 222.0 lb

## 2015-05-11 DIAGNOSIS — R739 Hyperglycemia, unspecified: Secondary | ICD-10-CM

## 2015-05-11 DIAGNOSIS — Z20828 Contact with and (suspected) exposure to other viral communicable diseases: Secondary | ICD-10-CM

## 2015-05-11 DIAGNOSIS — D72819 Decreased white blood cell count, unspecified: Secondary | ICD-10-CM | POA: Diagnosis not present

## 2015-05-11 DIAGNOSIS — Z1211 Encounter for screening for malignant neoplasm of colon: Secondary | ICD-10-CM

## 2015-05-11 DIAGNOSIS — I10 Essential (primary) hypertension: Secondary | ICD-10-CM

## 2015-05-11 DIAGNOSIS — E785 Hyperlipidemia, unspecified: Secondary | ICD-10-CM | POA: Diagnosis not present

## 2015-05-11 DIAGNOSIS — Z0000 Encounter for general adult medical examination without abnormal findings: Secondary | ICD-10-CM

## 2015-05-11 DIAGNOSIS — Z23 Encounter for immunization: Secondary | ICD-10-CM

## 2015-05-11 NOTE — Assessment & Plan Note (Signed)
S: controlled on simvastatin 40mg  with LDL 92  Lab Results  Component Value Date   CHOL 146 08/10/2014   HDL 43.10 08/10/2014   LDLCALC 79 08/10/2014   LDLDIRECT 92.0 05/04/2015   TRIG 122.0 08/10/2014   CHOLHDL 3 08/10/2014  A/P: continue current medication

## 2015-05-11 NOTE — Assessment & Plan Note (Signed)
S: at risk for diabetes based off fasting sugar and a1c A/P: Encouraged need for healthy eating, regular exercise, weight loss.

## 2015-05-11 NOTE — Assessment & Plan Note (Signed)
S: controlled previously when patient was exercising and watching salt intake. Mild poor control diastolic today on atenolol 50mg  BP Readings from Last 3 Encounters:  05/11/15 120/90  11/15/14 112/64  09/02/14 108/62  A/P:Continue current meds:  Advised dash diet and resume exercise rather than add additional medicine.

## 2015-05-11 NOTE — Progress Notes (Signed)
Brandon Reddish, MD Phone: [phone]  Subjective:  Patient presents today for their welcome to medicare visit.    Preventive Screening-Counseling & Management  Smoking Status: Never Smoker Second Hand Smoking status: No smokers in home  Risk Factors Regular exercise: walking 3-4x a week for 2 miles. Hiking. Considering going to gym with wife.  Diet: needs to work on portion size [employer] Readings from Last 3 Encounters:  05/11/15 222 lb (100.699 kg)  11/15/14 222 lb (100.699 kg)  09/02/14 220 lb 4.8 oz (99.927 kg)  Fall Risk: None   Cardiac risk factors:  advanced age (older than 9 for men, 23 for women)  Hyperlipidemia - controlled No diabetes. At risk though Family History: mom with stroke but blood pressure related after stopping meds  Depression Screen None. PHQ2 0  Patient made aware of risk factors for depression and mood disorder. No current concern  Activities of Daily Living Independent ADLs and IADLs   Hearing Difficulties: -patient endorses- has been tested and not using aids yet- consideing purchasing  Cognitive Testing No reported trouble.   Normal 3 word recall  List the Names of Other Physician/Practitioners you currently use: 1.Dr. [facility] cardiology- not regularly 2. Optho - no regular visits. Has seen Dr. Gershon Crane.   Immunization History  Administered Date(s) Administered  . Hepatitis A 12/25/2005, 11/25/2007  . Hepatitis B 05/25/1996, 06/25/1997, 12/01/1998  . Influenza,inj,Quad PF,36+ Mos 02/18/2013, 05/03/2014  . Influenza-Unspecified 03/16/2015  . PPD Test 08/15/2012  . Pneumococcal Conjugate-13 05/11/2015  . Pneumococcal Polysaccharide-23 05/03/2014  . Td 12/05/2005  . Zoster 02/15/2012   Required Immunizations needed today: prevnar 13 given  Screening tests- up to date Health Maintenance Due  Topic Date Due  . Hepatitis C Screening - plan for next physical, already had blood drawn 29-Aug-1950  . COLONOSCOPY -referred today  12/20/2014   ROS- No pertinent positives discovered in course of AWV- some discovery in problem based charting in separate note  The following were reviewed and entered/updated in epic: Past Medical History  Diagnosis Date  . NEOPLASM, MALIGNANT, SKIN, BACK 02/09/2009  . HYPERLIPIDEMIA 02/04/2007  . Chronic prostatitis 12/17/2008  . ERECTILE DYSFUNCTION 02/07/2007  . HYPERTENSION 02/04/2007  . Diverticulosis 01/16/2011    History of diverticulitis    Patient Active Problem List   Diagnosis Date Noted  . BPH associated with nocturia 11/15/2014    Priority: Medium  . Leukopenia 11/15/2014    Priority: Medium  . Hyperglycemia 08/16/2014    Priority: Medium  . Hyperlipidemia 02/04/2007    Priority: Medium  . Essential hypertension 02/04/2007    Priority: Medium  . Chest pain 08/13/2014    Priority: Low  . GERD (gastroesophageal reflux disease) 05/03/2014    Priority: Low  . Cervical disc disorder with radiculopathy of cervical region 07/14/2010    Priority: Low  . MIXED HEARING LOSS BILATERAL 07/14/2010    Priority: Low  . ERECTILE DYSFUNCTION 02/07/2007    Priority: Low   Past Surgical History  Procedure Laterality Date  . None      Family History  Problem Relation Age of Onset  . Breast cancer Mother   . Alzheimer's disease Father     late 56s. mid 72s  . Asperger's syndrome Son   . CVA Mother     quit taking HTN meds    Medications- reviewed and updated Current Outpatient Prescriptions  Medication Sig Dispense Refill  . aspirin 81 MG tablet Take 81 mg by mouth daily.      Marland Kitchen atenolol (  TENORMIN) 50 MG tablet Take 1 tablet (50 mg total) by mouth daily. 90 tablet 3  . folic acid (FOLVITE) A999333 MCG tablet Take 1 tablet (400 mcg total) by mouth daily. 30 tablet 1  . Multiple Vitamin (MULTIVITAMIN) capsule Take 1 capsule by mouth daily.      Marland Kitchen omeprazole (PRILOSEC OTC) 20 MG tablet take 1 tablet by mouth once daily 90 tablet 3  . simvastatin (ZOCOR) 40 MG tablet Take 1  tablet (40 mg total) by mouth daily. 90 tablet 2  . Sodium Fluoride (PREVIDENT 5000 BOOSTER PLUS DT) Place 1 application onto teeth 2 (two) times daily.    Marland Kitchen acetaminophen (TYLENOL) 325 MG tablet Take 2 tablets (650 mg total) by mouth every 4 (four) hours as needed for mild pain, moderate pain or headache. (Patient not taking: Reported on 11/15/2014)    . sildenafil (REVATIO) 20 MG tablet Take 2 tablets (40 mg total) by mouth daily as needed. (Patient not taking: Reported on 11/15/2014) 60 tablet 5   No current facility-administered medications for this visit.    Allergies-reviewed and updated No Known Allergies  Social History   Social History  . Marital Status: Married    Spouse Name: N/A  . Number of Children: N/A  . Years of Education: N/A   Social History Main Topics  . Smoking status: Never Smoker   . Smokeless tobacco: None  . Alcohol Use: 1.8 oz/week    3 Standard drinks or equivalent per week  . Drug Use: No  . Sexual Activity: Yes   Other Topics Concern  . None   Social History Narrative   Married 1973. Wife has Parkinsons. 2 daughters, 1 son (middle). 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  In addition to above we discussed advanced directives- patient In process with lawyer for [facility], living will, financial planning- declines packet  Written plan for preventative services discussed and given per AVS  Orders Placed This Encounter  Procedures  . Pneumococcal conjugate vaccine 13-valent  . Ambulatory referral to Gastroenterology- needs updated  colonoscopy    Referral Priority:  Routine    Referral Type:  Consultation    Referral Reason:  Specialty Services Required    Number of Visits Requested:  1

## 2015-05-11 NOTE — Progress Notes (Signed)
Garret Reddish, MD  Subjective:  Brandon [name] is a 64 y.o. year old very pleasant male patient who presents for/with See problem oriented charting ROS- No chest pain or shortness of breath. No headache or blurry vision. No fever, chills, unintentional weight loss, night sweats.   Past Medical History-  Patient Active Problem List   Diagnosis Date Noted  . BPH associated with nocturia 11/15/2014    Priority: Medium  . Leukopenia 11/15/2014    Priority: Medium  . Hyperglycemia 08/16/2014    Priority: Medium  . Hyperlipidemia 02/04/2007    Priority: Medium  . Essential hypertension 02/04/2007    Priority: Medium  . Chest pain 08/13/2014    Priority: Low  . GERD (gastroesophageal reflux disease) 05/03/2014    Priority: Low  . Cervical disc disorder with radiculopathy of cervical region 07/14/2010    Priority: Low  . MIXED HEARING LOSS BILATERAL 07/14/2010    Priority: Low  . ERECTILE DYSFUNCTION 02/07/2007    Priority: Low    Medications- reviewed and updated Current Outpatient Prescriptions  Medication Sig Dispense Refill  . aspirin 81 MG tablet Take 81 mg by mouth daily.      Marland Kitchen atenolol (TENORMIN) 50 MG tablet Take 1 tablet (50 mg total) by mouth daily. 90 tablet 3  . folic acid (FOLVITE) A999333 MCG tablet Take 1 tablet (400 mcg total) by mouth daily. 30 tablet 1  . Multiple Vitamin (MULTIVITAMIN) capsule Take 1 capsule by mouth daily.      Marland Kitchen omeprazole (PRILOSEC OTC) 20 MG tablet take 1 tablet by mouth once daily 90 tablet 3  . simvastatin (ZOCOR) 40 MG tablet Take 1 tablet (40 mg total) by mouth daily. 90 tablet 2  . Sodium Fluoride (PREVIDENT 5000 BOOSTER PLUS DT) Place 1 application onto teeth 2 (two) times daily.    Marland Kitchen acetaminophen (TYLENOL) 325 MG tablet Take 2 tablets (650 mg total) by mouth every 4 (four) hours as needed for mild pain, moderate pain or headache. (Patient not taking: Reported on 11/15/2014)    . sildenafil (REVATIO) 20 MG tablet Take 2 tablets (40 mg  total) by mouth daily as needed. (Patient not taking: Reported on 11/15/2014) 60 tablet 5   No current facility-administered medications for this visit.    Objective: BP 120/90 mmHg  Pulse 62  Temp(Src) 98.2 F (36.8 C)  Ht 6' (1.829 m)  Wt 222 lb (100.699 kg)  BMI 30.10 kg/m2 Gen: NAD, resting comfortably HEENT: Mucous membranes are moist. Oropharynx normal Neck: no thyromegaly CV: RRR no murmurs rubs or gallops Lungs: CTAB no crackles, wheeze, rhonchi Abdomen: soft/nontender/nondistended/normal bowel sounds. No rebound or guarding.  Rectal done last visit- will do in 1 year Ext: no edema Skin: warm, dry Neuro: grossly normal, moves all extremities, PERRLA   Assessment/Plan:  Hyperlipidemia S: controlled on simvastatin 40mg  with LDL 92  Lab Results  Component Value Date   CHOL 146 08/10/2014   HDL 43.10 08/10/2014   LDLCALC 79 08/10/2014   LDLDIRECT 92.0 05/04/2015   TRIG 122.0 08/10/2014   CHOLHDL 3 08/10/2014  A/P: continue current medication    Essential hypertension S: controlled previously when patient was exercising and watching salt intake. Mild poor control diastolic today on atenolol 50mg  BP Readings from Last 3 Encounters:  05/11/15 120/90  11/15/14 112/64  09/02/14 108/62  A/P:Continue current meds:  Advised dash diet and resume exercise rather than add additional medicine.    Hyperglycemia S: at risk for diabetes based off fasting sugar and  a1c A/P: Encouraged need for healthy eating, regular exercise, weight loss.     Leukopenia S: leukopenia remains stable in 3-4 range. We discussed could be myeloproliferative disorder. Lymphocytes were high on most recent set but had been dealing with GI illness.  A/P: discussed heme referral vs. Smear and continued monitoring. Jointly decided on smear and cbc in 6 months (ordered today) and continued monitoring.     soonerReturn precautions advised.   Orders Placed This Encounter  . CBC with  Differential/Platelet    Standing Status: Future     Number of Occurrences:      Standing Expiration Date: 05/10/2016  . Pathologist smear review    Standing Status: Future     Number of Occurrences:      Standing Expiration Date: 05/10/2016  . Hemoglobin A1c    [place]    Standing Status: Future     Number of Occurrences:      Standing Expiration Date: 05/10/2016  . Hepatitis C antibody, reflex    [facility]    Standing Status: Future     Number of Occurrences:      Standing Expiration Date: 05/10/2016

## 2015-05-11 NOTE — Patient Instructions (Addendum)
Final pneumonia shot [id number]) received today.  [facility] will call you to schedule your colonoscopy.  In 6 months would love to see you at 210 on our scales  Blood pressure hair high- exercise , dash eating plan can help get this back down.   At risk for diabetes so need weight loss as well  See me in 6 months and do labs at least a week before - including a1c, CBC, smear of blood    DASH Eating Plan DASH stands for "Dietary Approaches to Stop Hypertension." The DASH eating plan is a healthy eating plan that has been shown to reduce high blood pressure (hypertension). Additional health benefits may include reducing the risk of type 2 diabetes mellitus, heart disease, and stroke. The DASH eating plan may also help with weight loss. WHAT DO I NEED TO KNOW ABOUT THE DASH EATING PLAN? For the DASH eating plan, you will follow these general guidelines:  Choose foods with a percent daily value for sodium of less than 5% (as listed on the food label).  Use salt-free seasonings or herbs instead of table salt or sea salt.  Check with your health care provider or pharmacist before using salt substitutes.  Eat lower-sodium products, often labeled as "lower sodium" or "no salt added."  Eat fresh foods.  Eat more vegetables, fruits, and low-fat dairy products.  Choose whole grains. Look for the word "whole" as the first word in the ingredient list.  Choose fish and skinless chicken or [place] more often than red meat. Limit fish, poultry, and meat to 6 oz (170 g) each day.  Limit sweets, desserts, sugars, and sugary drinks.  Choose heart-healthy fats.  Limit cheese to 1 oz (28 g) per day.  Eat more home-cooked food and less restaurant, buffet, and fast food.  Limit fried foods.  Cook foods using methods other than frying.  Limit canned vegetables. If you do use them, rinse them well to decrease the sodium.  When eating at a restaurant, ask that your food be prepared with less  salt, or no salt if possible. WHAT FOODS CAN I EAT? Seek help from a dietitian for individual calorie needs. Grains Whole grain or whole wheat bread. Brown rice. Whole grain or whole wheat pasta. Quinoa, bulgur, and whole grain cereals. Low-sodium cereals. Corn or whole wheat flour tortillas. Whole grain cornbread. Whole grain crackers. Low-sodium crackers. Vegetables Fresh or frozen vegetables (raw, steamed, roasted, or grilled). Low-sodium or reduced-sodium tomato and vegetable juices. Low-sodium or reduced-sodium tomato sauce and paste. Low-sodium or reduced-sodium canned vegetables.  Fruits All fresh, canned (in natural juice), or frozen fruits. Meat and Other Protein Products Ground beef (85% or leaner), grass-fed beef, or beef trimmed of fat. Skinless chicken or [place]. Ground chicken or [place]. Pork trimmed of fat. All fish and seafood. Eggs. Dried beans, peas, or lentils. Unsalted nuts and seeds. Unsalted canned beans. Dairy Low-fat dairy products, such as skim or 1% milk, 2% or reduced-fat cheeses, low-fat ricotta or cottage cheese, or plain low-fat yogurt. Low-sodium or reduced-sodium cheeses. Fats and Oils Tub margarines without trans fats. Light or reduced-fat mayonnaise and salad dressings (reduced sodium). Avocado. Safflower, olive, or canola oils. Natural peanut or almond butter. Other Unsalted popcorn and pretzels. The items listed above may not be a complete list of recommended foods or beverages. Contact your dietitian for more options. WHAT FOODS ARE NOT RECOMMENDED? Grains White bread. White pasta. White rice. Refined cornbread. Bagels and croissants. Crackers that contain trans fat. Vegetables Creamed  or fried vegetables. Vegetables in a cheese sauce. Regular canned vegetables. Regular canned tomato sauce and paste. Regular tomato and vegetable juices. Fruits Dried fruits. Canned fruit in light or heavy syrup. Fruit juice. Meat and Other Protein Products Fatty cuts of  meat. Ribs, chicken wings, bacon, sausage, bologna, salami, chitterlings, fatback, hot dogs, bratwurst, and packaged luncheon meats. Salted nuts and seeds. Canned beans with salt. Dairy Whole or 2% milk, cream, half-and-half, and cream cheese. Whole-fat or sweetened yogurt. Full-fat cheeses or blue cheese. Nondairy creamers and whipped toppings. Processed cheese, cheese spreads, or cheese curds. Condiments Onion and garlic salt, seasoned salt, table salt, and sea salt. Canned and packaged gravies. Worcestershire sauce. Tartar sauce. Barbecue sauce. Teriyaki sauce. Soy sauce, including reduced sodium. Steak sauce. Fish sauce. Oyster sauce. Cocktail sauce. Horseradish. Ketchup and mustard. Meat flavorings and tenderizers. Bouillon cubes. Hot sauce. Tabasco sauce. Marinades. Taco seasonings. Relishes. Fats and Oils Butter, stick margarine, lard, shortening, ghee, and bacon fat. Coconut, palm kernel, or palm oils. Regular salad dressings. Other Pickles and olives. Salted popcorn and pretzels. The items listed above may not be a complete list of foods and beverages to avoid. Contact your dietitian for more information. WHERE CAN I FIND MORE INFORMATION? National Heart, Lung, and Blood Institute: travelstabloid.com   This information is not intended to replace advice given to you by your health care provider. Make sure you discuss any questions you have with your health care provider.   Document Released: 05/31/2011 Document Revised: 07/02/2014 Document Reviewed: 04/15/2013 Elsevier Interactive Patient Education 2016 [employer].   Mr. Write , Thank you for taking time to come for your Medicare Wellness Visit. I appreciate your ongoing commitment to your health goals. Please review the following plan we discussed and let me know if I can assist you in the future.   These are the goals we discussed: See above   This is a list of the screening recommended for you  and due dates:  Health Maintenance  Topic Date Due  .  Hepatitis C: One time screening is recommended by Center for Disease Control  (CDC) for  adults born from [birth year] through 1966.   11-24-1950  . Colon Cancer Screening  12/20/2014  . Tetanus Vaccine  12/06/2015  . Flu Shot  01/24/2016  . Pneumonia vaccines (2 of 2 - PPSV23) 05/04/2019  . Shingles Vaccine  Completed  . HIV Screening  Completed

## 2015-05-11 NOTE — Assessment & Plan Note (Signed)
S: leukopenia remains stable in 3-4 range. We discussed could be myeloproliferative disorder. Lymphocytes were high on most recent set but had been dealing with GI illness.  A/P: discussed heme referral vs. Smear and continued monitoring. Jointly decided on smear and cbc in 6 months (ordered today) and continued monitoring.

## 2015-09-12 ENCOUNTER — Encounter: Payer: Self-pay | Admitting: Family Medicine

## 2015-09-12 ENCOUNTER — Other Ambulatory Visit: Payer: Self-pay

## 2015-09-12 ENCOUNTER — Other Ambulatory Visit: Payer: Self-pay | Admitting: *Deleted

## 2015-09-12 DIAGNOSIS — I10 Essential (primary) hypertension: Secondary | ICD-10-CM

## 2015-09-12 MED ORDER — OMEPRAZOLE MAGNESIUM 20 MG PO TBEC: DELAYED_RELEASE_TABLET | ORAL | Status: DC

## 2015-09-12 MED ORDER — ATENOLOL 50 MG PO TABS: 50.0000 mg | ORAL_TABLET | Freq: Every day | ORAL | Status: DC

## 2015-11-02 ENCOUNTER — Other Ambulatory Visit: Payer: Self-pay | Admitting: Family Medicine

## 2015-11-02 ENCOUNTER — Encounter: Payer: Self-pay | Admitting: Family Medicine

## 2015-11-02 ENCOUNTER — Other Ambulatory Visit (INDEPENDENT_AMBULATORY_CARE_PROVIDER_SITE_OTHER): Payer: Medicare Other

## 2015-11-02 DIAGNOSIS — R739 Hyperglycemia, unspecified: Secondary | ICD-10-CM | POA: Diagnosis not present

## 2015-11-02 DIAGNOSIS — Z20828 Contact with and (suspected) exposure to other viral communicable diseases: Secondary | ICD-10-CM | POA: Diagnosis not present

## 2015-11-02 DIAGNOSIS — D72819 Decreased white blood cell count, unspecified: Secondary | ICD-10-CM

## 2015-11-02 DIAGNOSIS — D709 Neutropenia, unspecified: Secondary | ICD-10-CM

## 2015-11-02 LAB — CBC WITH DIFFERENTIAL/PLATELET
Basophils Absolute: 0 10*3/uL (ref 0.0–0.1)
Basophils Relative: 0.4 % (ref 0.0–3.0)
EOS PCT: 1 % (ref 0.0–5.0)
Eosinophils Absolute: 0 10*3/uL (ref 0.0–0.7)
HCT: 38.3 % — ABNORMAL LOW (ref 39.0–52.0)
Hemoglobin: 13.1 g/dL (ref 13.0–17.0)
LYMPHS ABS: 1.2 10*3/uL (ref 0.7–4.0)
Lymphocytes Relative: 53.8 % — ABNORMAL HIGH (ref 12.0–46.0)
MCHC: 34.4 g/dL (ref 30.0–36.0)
MCV: 98.1 fl (ref 78.0–100.0)
MONO ABS: 0.1 10*3/uL (ref 0.1–1.0)
MONOS PCT: 4.2 % (ref 3.0–12.0)
NEUTROS ABS: 0.9 10*3/uL — AB (ref 1.4–7.7)
NEUTROS PCT: 40.6 % — AB (ref 43.0–77.0)
PLATELETS: 150 10*3/uL (ref 150.0–400.0)
RBC: 3.9 Mil/uL — ABNORMAL LOW (ref 4.22–5.81)
RDW: 14.4 % (ref 11.5–15.5)
WBC: 2.3 10*3/uL — ABNORMAL LOW (ref 4.0–10.5)

## 2015-11-02 LAB — HEMOGLOBIN A1C: Hgb A1c MFr Bld: 6 % (ref 4.6–6.5)

## 2015-11-03 LAB — HEPATITIS C ANTIBODY: HCV Ab: NEGATIVE

## 2015-11-03 NOTE — Telephone Encounter (Signed)
This is an important referral as it could represent leukemia. Discussed with patient this evening.  I need to know from hematology the time frame that they are going to see patient more concretely.  If not, want to refer to [facility], [facility], or [facility] which I suspect can get patient in sooner.   Neoma Laming- can we call [facility] and see and if not- check with other groups?

## 2015-11-04 ENCOUNTER — Encounter: Payer: Self-pay | Admitting: Family Medicine

## 2015-11-04 ENCOUNTER — Telehealth: Payer: Self-pay | Admitting: Hematology and Oncology

## 2015-11-04 NOTE — Telephone Encounter (Signed)
Verified address and appointment, in basket referring provider date/time, contact pt with appt date/time.

## 2015-11-07 ENCOUNTER — Telehealth: Payer: Self-pay | Admitting: Family Medicine

## 2015-11-07 NOTE — Telephone Encounter (Signed)
Unclear why pathologist smear review did not cross over  From 11/02/15 collection "Leukopenia due to absolute neutropenia. Rare mature neutrophils are seen. Rare atypical lymphs. No immature cells are identified. Platelets are unremarkable. No platelet clumps identified. normocytotic anemia with rare tear drop RBCs and scattered elliptocytes. Clinical correlation is recommended.   Has hematology/oncology visit on Friday

## 2015-11-08 ENCOUNTER — Telehealth: Payer: Self-pay | Admitting: General Practice

## 2015-11-08 NOTE — Telephone Encounter (Signed)
Pt has appointment on 11/09/15 @ 9:45 to address concerns.

## 2015-11-09 ENCOUNTER — Ambulatory Visit (INDEPENDENT_AMBULATORY_CARE_PROVIDER_SITE_OTHER): Payer: Medicare Other | Admitting: Family Medicine

## 2015-11-09 ENCOUNTER — Encounter: Payer: Self-pay | Admitting: Family Medicine

## 2015-11-09 VITALS — BP 138/88 | HR 60 | Temp 98.1°F | Ht 72.0 in | Wt 212.0 lb

## 2015-11-09 DIAGNOSIS — I10 Essential (primary) hypertension: Secondary | ICD-10-CM | POA: Diagnosis not present

## 2015-11-09 DIAGNOSIS — D72819 Decreased white blood cell count, unspecified: Secondary | ICD-10-CM | POA: Diagnosis not present

## 2015-11-09 DIAGNOSIS — R739 Hyperglycemia, unspecified: Secondary | ICD-10-CM

## 2015-11-09 DIAGNOSIS — E785 Hyperlipidemia, unspecified: Secondary | ICD-10-CM

## 2015-11-09 NOTE — Assessment & Plan Note (Signed)
S: remains at risk for diabetes with a1c of 6.0 stable from 5.9 a year ago A/P: weight is down- continue weight loss efforts. Not unintentional loss

## 2015-11-09 NOTE — Assessment & Plan Note (Signed)
S: controlled today on atenolol 50mg . wieght down 10 lbs and diastolic now controlled. 120/80 at [facility] usually BP Readings from Last 3 Encounters:  11/09/15 138/88  05/11/15 120/90  11/15/14 112/64  A/P:Continue current medications

## 2015-11-09 NOTE — Progress Notes (Signed)
Subjective:  Brandon [name] is a 65 y.o. year old very pleasant male patient who presents for/with See problem oriented charting ROS- no fatigue or unintentional weight loss. No chest pain or shortness of breath. No headache or blurry vision. .see any ROS included in HPI as well.   Past Medical History-  Patient Active Problem List   Diagnosis Date Noted  . BPH associated with nocturia 11/15/2014    Priority: Medium  . Leukopenia 11/15/2014    Priority: Medium  . Hyperglycemia 08/16/2014    Priority: Medium  . Hyperlipidemia 02/04/2007    Priority: Medium  . Essential hypertension 02/04/2007    Priority: Medium  . Chest pain 08/13/2014    Priority: Low  . GERD (gastroesophageal reflux disease) 05/03/2014    Priority: Low  . Cervical disc disorder with radiculopathy of cervical region 07/14/2010    Priority: Low  . MIXED HEARING LOSS BILATERAL 07/14/2010    Priority: Low  . ERECTILE DYSFUNCTION 02/07/2007    Priority: Low    Medications- reviewed and updated Current Outpatient Prescriptions  Medication Sig Dispense Refill  . acetaminophen (TYLENOL) 325 MG tablet Take 2 tablets (650 mg total) by mouth every 4 (four) hours as needed for mild pain, moderate pain or headache.    Marland Kitchen aspirin 81 MG tablet Take 81 mg by mouth daily.      Marland Kitchen atenolol (TENORMIN) 50 MG tablet Take 1 tablet (50 mg total) by mouth daily. 90 tablet 0  . folic acid (FOLVITE) 542 MCG tablet Take 1 tablet (400 mcg total) by mouth daily. 30 tablet 1  . Multiple Vitamin (MULTIVITAMIN) capsule Take 1 capsule by mouth daily.      Marland Kitchen omeprazole (PRILOSEC OTC) 20 MG tablet take 1 tablet by mouth once daily 90 tablet 3  . sildenafil (REVATIO) 20 MG tablet Take 2 tablets (40 mg total) by mouth daily as needed. 60 tablet 5  . simvastatin (ZOCOR) 40 MG tablet Take 1 tablet (40 mg total) by mouth daily. 90 tablet 2  . Sodium Fluoride (PREVIDENT 5000 BOOSTER PLUS DT) Place 1 application onto teeth 2 (two) times daily.      No current facility-administered medications for this visit.    Objective: BP 138/88 mmHg  Pulse 60  Temp(Src) 98.1 F (36.7 C) (Oral)  Ht 6' (1.829 m)  Wt 212 lb (96.163 kg)  BMI 28.75 kg/m2 Gen: NAD, resting comfortably CV: RRR no murmurs rubs or gallops Lungs: CTAB no crackles, wheeze, rhonchi Abdomen: soft/nontender/nondistended/normal bowel sounds. No rebound or guarding.  Ext: no edema Skin: warm, dry  Assessment/Plan:  Hyperlipidemia S: well controlled on simvastatin 25m. No myalgias.  Lab Results  Component Value Date   CHOL 146 08/10/2014   HDL 43.10 08/10/2014   LDLCALC 79 08/10/2014   LDLDIRECT 92.0 05/04/2015   TRIG 122.0 08/10/2014   CHOLHDL 3 08/10/2014   A/P: continue current medications  Essential hypertension S: controlled today on atenolol 538m wieght down 10 lbs and diastolic now controlled. [facility] Readings from Last 3 Encounters:  11/09/15 138/88  05/11/15 120/90  11/15/14 112/64  A/P:Continue current medications  Hyperglycemia S: remains at risk for diabetes with a1c of 6.0 stable from 5.9 a year ago A/P: weight is down- continue weight loss efforts. Not unintentional loss  Leukopenia S: we have been monitoring since 2011 and in 3/1-4.2 range in that time. On labs this time, WBC now closer to 2 and has neutropenia. Referred to hematology and has visit Friday.  For some reason smear results did not cross over but I typed into phone note and scanned in "Leukopenia due to absolute neutropenia. Rare mature neutrophils are seen. Rare atypical lymphs. No immature cells are identified. Platelets are unremarkable. No platelet clumps identified. normocytotic anemia with rare tear drop RBCs and scattered elliptocytes. Clinical correlation is recommended." A/P: Counseling provided today to deal with stress of issue and I discussed with patient we need hematology input before we really know anything- discussed multiple potential  etiologies including leukemia but would need hematology opinion and further workup. Discussed possibility of bone marrow biopsy. Patient admits to being highly anxious individual and we discussed coping mechanisms  Reviewed meds relation Simvastatin <1% leukopenia prilosec <1% neutropenia, leukopenia viagra <2% leukopenia   Return precautions advised. Did not specifically discuss but likely CPE 6 months  The duration of face-to-face time during this visit was 25 minutes. Greater than 50% of this time was spent in counseling, explanation of diagnosis, planning of further management, and/or coordination of care.    Garret Reddish, MD

## 2015-11-09 NOTE — Assessment & Plan Note (Signed)
S: we have been monitoring since 2011 and in 3/1-4.2 range in that time. On labs this time, WBC now closer to 2 and has neutropenia. Referred to hematology and has visit Friday. For some reason smear results did not cross over but I typed into phone note and scanned in "Leukopenia due to absolute neutropenia. Rare mature neutrophils are seen. Rare atypical lymphs. No immature cells are identified. Platelets are unremarkable. No platelet clumps identified. normocytotic anemia with rare tear drop RBCs and scattered elliptocytes. Clinical correlation is recommended." A/P: Counseling provided today to deal with stress of issue and I discussed with patient we need hematology input before we really know anything- discussed multiple potential etiologies including leukemia but would need hematology opinion and further workup. Discussed possibility of bone marrow biopsy. Patient admits to being highly anxious individual and we discussed coping mechanisms  Reviewed meds relation Simvastatin <1% leukopenia prilosec <1% neutropenia, leukopenia viagra <2% leukopenia

## 2015-11-09 NOTE — Patient Instructions (Addendum)
Great job with weight loss!   No change in medications unless hematology says so Risks per up to date in relation to causing low blood counts Simvastatin <1% leukopenia prilosec <1% neutropenia, leukopenia viagra <2%   We will await the results

## 2015-11-09 NOTE — Assessment & Plan Note (Signed)
S: well controlled on simvastatin 40mg . No myalgias.  Lab Results  Component Value Date   CHOL 146 08/10/2014   HDL 43.10 08/10/2014   LDLCALC 79 08/10/2014   LDLDIRECT 92.0 05/04/2015   TRIG 122.0 08/10/2014   CHOLHDL 3 08/10/2014   A/P: continue current medications

## 2015-11-10 NOTE — Progress Notes (Signed)
Message was taken care of.

## 2015-11-11 ENCOUNTER — Ambulatory Visit (HOSPITAL_BASED_OUTPATIENT_CLINIC_OR_DEPARTMENT_OTHER): Payer: Medicare Other

## 2015-11-11 ENCOUNTER — Other Ambulatory Visit (HOSPITAL_COMMUNITY)
Admission: RE | Admit: 2015-11-11 | Discharge: 2015-11-11 | Disposition: A | Discharge status: Home / Self Care | Payer: Medicare Other | Source: Ambulatory Visit | Attending: Hematology and Oncology | Admitting: Hematology and Oncology

## 2015-11-11 ENCOUNTER — Telehealth: Payer: Self-pay | Admitting: Hematology and Oncology

## 2015-11-11 ENCOUNTER — Ambulatory Visit (HOSPITAL_BASED_OUTPATIENT_CLINIC_OR_DEPARTMENT_OTHER): Payer: Medicare Other | Admitting: Hematology and Oncology

## 2015-11-11 ENCOUNTER — Encounter: Payer: Self-pay | Admitting: Hematology and Oncology

## 2015-11-11 VITALS — BP 138/92 | HR 72 | Temp 98.8°F | Resp 18 | Ht 72.0 in | Wt 209.5 lb

## 2015-11-11 DIAGNOSIS — C44509 Unspecified malignant neoplasm of skin of other part of trunk: Secondary | ICD-10-CM

## 2015-11-11 DIAGNOSIS — Z803 Family history of malignant neoplasm of breast: Secondary | ICD-10-CM | POA: Diagnosis not present

## 2015-11-11 DIAGNOSIS — D72819 Decreased white blood cell count, unspecified: Secondary | ICD-10-CM

## 2015-11-11 DIAGNOSIS — D709 Neutropenia, unspecified: Secondary | ICD-10-CM

## 2015-11-11 LAB — CBC WITH DIFFERENTIAL/PLATELET
BASO%: 0 % (ref 0.0–2.0)
Basophils Absolute: 0 10*3/uL (ref 0.0–0.1)
EOS%: 0.7 % (ref 0.0–7.0)
Eosinophils Absolute: 0 10*3/uL (ref 0.0–0.5)
HEMATOCRIT: 39.9 % (ref 38.4–49.9)
HEMOGLOBIN: 14.3 g/dL (ref 13.0–17.1)
LYMPH#: 1 10*3/uL (ref 0.9–3.3)
LYMPH%: 34 % (ref 14.0–49.0)
MCH: 34 pg — AB (ref 27.2–33.4)
MCHC: 35.8 g/dL (ref 32.0–36.0)
MCV: 94.8 fL (ref 79.3–98.0)
MONO#: 0.1 10*3/uL (ref 0.1–0.9)
MONO%: 4 % (ref 0.0–14.0)
NEUT%: 61.3 % (ref 39.0–75.0)
NEUTROS ABS: 1.8 10*3/uL (ref 1.5–6.5)
Platelets: 153 10*3/uL (ref 140–400)
RBC: 4.21 10*6/uL (ref 4.20–5.82)
RDW: 13.3 % (ref 11.0–14.6)
WBC: 3 10*3/uL — AB (ref 4.0–10.3)
nRBC: 0 % (ref 0–0)

## 2015-11-11 NOTE — Assessment & Plan Note (Addendum)
Neutropenia: Patient has long-standing mild leukopenia going back to several years but only recently his neutrophil count has declined to 900 with a total white count of 2.3. The prior blood counts were from 6 months ago when his white count was 3.7 and neutrophil count was 1.7. Today's blood work revealed white count 3.0 with the [place] of 1.8.   Differential diagnosis of neutropenia (with normal hemoglobin and platelets): 1. Infections: Hepatitis B and C and HIV infections and other viral illnesses can cause it. Previous HIV test was negative   2. Medications: I reviewed his list of medications and that doesn't seem to be anything new or different lately. 3. Autoimmune causes 4. Nutritional causes including S-49 and folic acid deficiency (these are ruled out based upon normal Q75 and Folic acid levels done today) 5. Bone marrow disorders: I will obtain flow cytometry and FISH for BCR/ABL. If all of these tests are negative then I will perform a bone marrow biopsy.

## 2015-11-11 NOTE — Telephone Encounter (Signed)
sched added lab appt per VG 5/19 pof

## 2015-11-12 LAB — FOLATE

## 2015-11-12 LAB — VITAMIN B12: Vitamin B12: 526 pg/mL (ref 211–946)

## 2015-11-14 ENCOUNTER — Encounter: Payer: Self-pay | Admitting: Hematology and Oncology

## 2015-11-14 LAB — ANTINUCLEAR ANTIBODIES, IFA
ANTINUCLEAR ANTIBODIES, IFA: POSITIVE — AB
Spindle Apparatus Pattern: 1:80 {titer}

## 2015-11-14 NOTE — Progress Notes (Signed)
[facility] CONSULT NOTE  Patient Care Team: Stephen O Hunter, MD as PCP - General (Family Medicine)  CHIEF COMPLAINTS/PURPOSE OF CONSULTATION:  Leukopenia primarily neutropenia  HISTORY OF PRESENTING ILLNESS:  Brandon [name] 65 y.o. male is here because of decrease in the white blood cell count and neutrophils. Patient is an avid traveler who travels different countries and enjoys birdwatching. He is mean to many states in the [place] including most recently in [place]. He has not had any health issues or illnesses lately. His medications have been stable for several years. He had over the previous years and been to [place] as well as other countries in [place]. He is here today accompanied by his wife to discuss his issues with white blood cell count. It appears that most recent blood work revealed that his absolute neutrophil count had decreased to 0.9. In the previous year his total white count has been slightly below normal. However his ANC has remained around 1.8. With this sudden drop in his white count, particularly neutrophils, he was referred to us for urgent evaluation. He denies any fevers or chills. He denies any recent infections. There have not been any recent antibiotics. His medications have remained stable. He does not feel ill. He denies any fevers chills night sweats or abnormal weight loss.  I reviewed her records extensively and collaborated the history with the patient.   MEDICAL HISTORY:  Past Medical History  Diagnosis Date  . NEOPLASM, MALIGNANT, SKIN, BACK 02/09/2009  . HYPERLIPIDEMIA 02/04/2007  . Chronic prostatitis 12/17/2008  . ERECTILE DYSFUNCTION 02/07/2007  . HYPERTENSION 02/04/2007  . Diverticulosis 01/16/2011    History of diverticulitis   . Allergy     seasonal/animals    SURGICAL HISTORY: Past Surgical History  Procedure Laterality Date  . None      SOCIAL HISTORY: Social History   Social History  . Marital Status: Married    Spouse  Name: N/A  . Number of Children: N/A  . Years of Education: N/A   Occupational History  . Not on file.   Social History Main Topics  . Smoking status: Never Smoker   . Smokeless tobacco: Not on file  . Alcohol Use: 1.8 oz/week    3 Standard drinks or equivalent per week  . Drug Use: No  . Sexual Activity: Yes   Other Topics Concern  . Not on file   Social History Narrative   Married 1973. Wife has Parkinsons. 2 daughters, 1 son (middle). 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 Breast cancer Mother   . CVA Mother     quit taking HTN meds  . Alzheimer's disease Father     late 50s. mid 60s  . Asperger's syndrome Son     ALLERGIES:  has No Known Allergies.  MEDICATIONS:  Current Outpatient Prescriptions  Medication Sig Dispense Refill  . acetaminophen (TYLENOL) 325 MG tablet Take 2 tablets (650 mg total) by mouth every 4 (four) hours as needed for mild pain, moderate pain or headache.    . aspirin 81 MG tablet Take 81 mg by mouth daily.      . atenolol (TENORMIN) 50 MG tablet Take 1 tablet (50 mg total) by mouth daily. 90 tablet 0  . folic acid (FOLVITE) 400 MCG tablet Take   1 tablet (400 mcg total) by mouth daily. 30 tablet 1  . Multiple Vitamin (MULTIVITAMIN) capsule Take 1 capsule by mouth daily.      Marland Kitchen omeprazole (PRILOSEC OTC) 20 MG tablet take 1 tablet by mouth once daily 90 tablet 3  . sildenafil (REVATIO) 20 MG tablet Take 2 tablets (40 mg total) by mouth daily as needed. 60 tablet 5  . simvastatin (ZOCOR) 40 MG tablet Take 1 tablet (40 mg total) by mouth daily. 90 tablet 2  . Sodium Fluoride (PREVIDENT 5000 BOOSTER PLUS DT) Place 1 application onto teeth 2 (two) times daily.     No current facility-administered medications for this visit.    REVIEW OF SYSTEMS:   Constitutional:  Denies fevers, chills or abnormal night sweats Eyes: Denies blurriness of vision, double vision or watery eyes Ears, nose, mouth, throat, and face: Denies mucositis or sore throat Respiratory: Denies cough, dyspnea or wheezes Cardiovascular: Denies palpitation, chest discomfort or lower extremity swelling Gastrointestinal:  Denies nausea, heartburn or change in bowel habits Skin: Denies abnormal skin rashes Lymphatics: Denies new lymphadenopathy or easy bruising Neurological:Denies numbness, tingling or new weaknesses Behavioral/Psych: Mood is stable, no new changes  All other systems were reviewed with the patient and are negative.  PHYSICAL EXAMINATION: ECOG PERFORMANCE STATUS: 0 - Asymptomatic  Filed Vitals:   11/11/15 1254  BP: 138/92  Pulse: 72  Temp: 98.8 F (37.1 C)  Resp: 18   Filed Weights   11/11/15 1254  Weight: 209 lb 8 oz (95.029 kg)    GENERAL:alert, no distress and comfortable SKIN: skin color, texture, turgor are normal, no rashes or significant lesions EYES: normal, conjunctiva are pink and non-injected, sclera clear OROPHARYNX:no exudate, no erythema and lips, buccal mucosa, and tongue normal  NECK: supple, thyroid normal size, non-tender, without nodularity LYMPH:  no palpable lymphadenopathy in the cervical, axillary or inguinal LUNGS: clear to auscultation and percussion with normal breathing effort HEART: regular rate & rhythm and no murmurs and no lower extremity edema ABDOMEN:abdomen soft, non-tender and normal bowel sounds Musculoskeletal:no cyanosis of digits and no clubbing  PSYCH: alert & oriented x 3 with fluent speech NEURO: no focal motor/sensory deficits  LABORATORY DATA:  I have reviewed the data as listed Lab Results  Component Value Date   WBC 3.0* 11/11/2015   HGB 14.3 11/11/2015   HCT 39.9 11/11/2015   MCV 94.8 11/11/2015   PLT 153 11/11/2015   Lab Results  Component Value Date   NA 138 05/04/2015   K 4.2 05/04/2015   CL 103  05/04/2015   CO2 26 05/04/2015   ASSESSMENT AND PLAN:  Leukopenia Neutropenia: Patient has long-standing mild leukopenia going back to several years but only recently his neutrophil count has declined to 900 with a total white count of 2.3. The prior blood counts were from 6 months ago when his white count was 3.7 and neutrophil count was 1.7. Today's blood work revealed white count 3.0 with the [place] of 1.8.   Differential diagnosis of neutropenia (with normal hemoglobin and platelets): 1. Infections: Hepatitis B and C and HIV infections and other viral illnesses can cause it. Previous HIV test was negative   2. Medications: I reviewed his list of medications and that doesn't seem to be anything new or different lately. 3. Autoimmune causes 4. Nutritional causes including G-50 and folic acid deficiency (these are ruled out based upon normal I37 and Folic acid levels done today) 5. Bone marrow disorders: I will obtain flow cytometry and  FISH for BCR/ABL. If all of these tests are negative then I will perform a bone marrow biopsy.   All questions were answered. The patient knows to call the clinic with any problems, questions or concerns.    Gudena, Vinay K, MD 11/14/2015   

## 2015-11-15 ENCOUNTER — Other Ambulatory Visit: Payer: Self-pay | Admitting: Hematology and Oncology

## 2015-11-15 DIAGNOSIS — D72819 Decreased white blood cell count, unspecified: Secondary | ICD-10-CM

## 2015-11-15 LAB — FLOW CYTOMETRY

## 2015-11-18 ENCOUNTER — Encounter: Payer: Self-pay | Admitting: *Deleted

## 2015-11-18 NOTE — Progress Notes (Signed)
BCR/ABL1 report received from [facility], reviewed by Dr. Lindi Adie, sent to scan.

## 2015-11-30 ENCOUNTER — Other Ambulatory Visit: Payer: Self-pay | Admitting: Radiology

## 2015-12-01 ENCOUNTER — Ambulatory Visit (HOSPITAL_COMMUNITY)
Admission: RE | Admit: 2015-12-01 | Discharge: 2015-12-01 | Disposition: A | Discharge status: Home / Self Care | Payer: Medicare Other | Source: Ambulatory Visit | Attending: Hematology and Oncology | Admitting: Hematology and Oncology

## 2015-12-01 ENCOUNTER — Encounter (HOSPITAL_COMMUNITY): Payer: Self-pay

## 2015-12-01 DIAGNOSIS — Z7982 Long term (current) use of aspirin: Secondary | ICD-10-CM | POA: Insufficient documentation

## 2015-12-01 DIAGNOSIS — Z79899 Other long term (current) drug therapy: Secondary | ICD-10-CM | POA: Diagnosis not present

## 2015-12-01 DIAGNOSIS — E785 Hyperlipidemia, unspecified: Secondary | ICD-10-CM | POA: Insufficient documentation

## 2015-12-01 DIAGNOSIS — Z823 Family history of stroke: Secondary | ICD-10-CM | POA: Diagnosis not present

## 2015-12-01 DIAGNOSIS — D72819 Decreased white blood cell count, unspecified: Secondary | ICD-10-CM | POA: Insufficient documentation

## 2015-12-01 DIAGNOSIS — D4622 Refractory anemia with excess of blasts 2: Secondary | ICD-10-CM | POA: Diagnosis not present

## 2015-12-01 DIAGNOSIS — I10 Essential (primary) hypertension: Secondary | ICD-10-CM | POA: Insufficient documentation

## 2015-12-01 DIAGNOSIS — Z803 Family history of malignant neoplasm of breast: Secondary | ICD-10-CM | POA: Diagnosis not present

## 2015-12-01 LAB — CBC WITH DIFFERENTIAL/PLATELET
BASOS ABS: 0 10*3/uL (ref 0.0–0.1)
Basophils Relative: 0 %
Eosinophils Absolute: 0 10*3/uL (ref 0.0–0.7)
Eosinophils Relative: 1 %
HCT: 39.8 % (ref 39.0–52.0)
Hemoglobin: 13.9 g/dL (ref 13.0–17.0)
Lymphocytes Relative: 44 %
Lymphs Abs: 1.4 10*3/uL (ref 0.7–4.0)
MCH: 34.1 pg — ABNORMAL HIGH (ref 26.0–34.0)
MCHC: 34.9 g/dL (ref 30.0–36.0)
MCV: 97.5 fL (ref 78.0–100.0)
MONO ABS: 0.1 10*3/uL (ref 0.1–1.0)
Monocytes Relative: 4 %
Neutro Abs: 1.6 10*3/uL — ABNORMAL LOW (ref 1.7–7.7)
Neutrophils Relative %: 51 %
Platelets: 153 10*3/uL (ref 150–400)
RBC: 4.08 MIL/uL — ABNORMAL LOW (ref 4.22–5.81)
RDW: 13.8 % (ref 11.5–15.5)
WBC: 3.1 10*3/uL — AB (ref 4.0–10.5)

## 2015-12-01 LAB — BASIC METABOLIC PANEL
Anion gap: 7 (ref 5–15)
BUN: 14 mg/dL (ref 6–20)
CALCIUM: 9 mg/dL (ref 8.9–10.3)
CO2: 24 mmol/L (ref 22–32)
CREATININE: 0.98 mg/dL (ref 0.61–1.24)
Chloride: 108 mmol/L (ref 101–111)
GFR calc Af Amer: >60 mL/min (ref >60)
GLUCOSE: 107 mg/dL — AB (ref 65–99)
Potassium: 4.1 mmol/L (ref 3.5–5.1)
SODIUM: 139 mmol/L (ref 135–145)

## 2015-12-01 LAB — BONE MARROW EXAM

## 2015-12-01 LAB — PROTIME-INR
INR: 1.02 (ref 0.00–1.49)
Prothrombin Time: 13.7 seconds (ref 11.6–15.2)

## 2015-12-01 MED ORDER — SODIUM CHLORIDE 0.9 % IV SOLN
INTRAVENOUS | Status: DC
  Administered 2015-12-01: 08:00:00 via INTRAVENOUS

## 2015-12-01 MED ORDER — FENTANYL CITRATE (PF) 100 MCG/2ML IJ SOLN
INTRAMUSCULAR | Status: AC | PRN
  Administered 2015-12-01: 50 ug via INTRAVENOUS

## 2015-12-01 MED ORDER — MIDAZOLAM HCL 2 MG/2ML IJ SOLN
INTRAMUSCULAR | Status: AC | PRN
  Administered 2015-12-01: 1 mg via INTRAVENOUS
  Administered 2015-12-01: 0.5 mg via INTRAVENOUS

## 2015-12-01 MED ORDER — FENTANYL CITRATE (PF) 100 MCG/2ML IJ SOLN
INTRAMUSCULAR | Status: AC
  Filled 2015-12-01: qty 2

## 2015-12-01 MED ORDER — MIDAZOLAM HCL 2 MG/2ML IJ SOLN
INTRAMUSCULAR | Status: AC
  Filled 2015-12-01: qty 4

## 2015-12-01 NOTE — Consult Note (Signed)
Chief Complaint: Patient was seen in consultation today for CT guided bone marrow biopsy  Referring Physician(s): [place]  Supervising Physician: Aletta Edouard  Patient Status:  Out-pt  History of Present Illness: Brandon [name] is a 65 y.o. male with history of long-standing mild leukopenia/neutropenia who presents today for CT-guided bone marrow biopsy for further evaluation.  Past Medical History  Diagnosis Date  . NEOPLASM, MALIGNANT, SKIN, BACK 02/09/2009  . HYPERLIPIDEMIA 02/04/2007  . Chronic prostatitis 12/17/2008  . ERECTILE DYSFUNCTION 02/07/2007  . HYPERTENSION 02/04/2007  . Diverticulosis 01/16/2011    History of diverticulitis   . Allergy     seasonal/animals    Past Surgical History  Procedure Laterality Date  . None      Allergies: Review of patient's allergies indicates no known allergies.  Medications: Prior to Admission medications   Medication Sig Start Date End Date Taking? Authorizing Provider  acetaminophen (TYLENOL) 325 MG tablet Take 2 tablets (650 mg total) by mouth every 4 (four) hours as needed for mild pain, moderate pain or headache. 08/13/14  Yes Modena Jansky, MD  aspirin 81 MG tablet Take 81 mg by mouth daily.     Yes Historical Provider, MD  atenolol (TENORMIN) 50 MG tablet Take 1 tablet (50 mg total) by mouth daily. 09/12/15  Yes [name], MD  folic acid (FOLVITE) 370 MCG tablet Take 1 tablet (400 mcg total) by mouth daily. 08/15/12  Yes Ricard Dillon, MD  Multiple Vitamin (MULTIVITAMIN) capsule Take 1 capsule by mouth daily.     Yes Historical Provider, MD  omeprazole (PRILOSEC OTC) 20 MG tablet take 1 tablet by mouth once daily 09/12/15  Yes [name], MD  simvastatin (ZOCOR) 40 MG tablet Take 1 tablet (40 mg total) by mouth daily. 04/27/15  Yes [name], MD  sildenafil (REVATIO) 20 MG tablet Take 2 tablets (40 mg total) by mouth daily as needed. 11/13/13   Ricard Dillon, MD  Sodium Fluoride (PREVIDENT  5000 BOOSTER PLUS DT) Place 1 application onto teeth 2 (two) times daily.    Historical Provider, MD     Family History  Problem Relation Age of Onset  . Breast cancer Mother   . CVA Mother     quit taking HTN meds  . Alzheimer's disease Father     late 39s. mid 69s  . Asperger's syndrome Son     Social History   Social History  . Marital Status: Married    Spouse Name: N/A  . Number of Children: N/A  . Years of Education: N/A   Social History Main Topics  . Smoking status: Never Smoker   . Smokeless tobacco: Not on file  . Alcohol Use: 1.8 oz/week    3 Standard drinks or equivalent per week  . Drug Use: No  . Sexual Activity: Yes   Other Topics Concern  . Not on file   Social History Narrative   Married 1973. Wife has Parkinsons. 2 daughters, 1 son (middle). 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  Respiratory: Negative for cough and shortness of breath.   Cardiovascular: Negative for chest pain.  Gastrointestinal: Negative for nausea, vomiting, abdominal pain and blood in stool.  Genitourinary: Negative for dysuria and hematuria.  Musculoskeletal: Negative for back pain.  Neurological: Negative for headaches.    Vital Signs:Blood pressure 141/85, heart rate 60, temperature 97.9, respirations 16, O2 sats 99% room air   Physical Exam  Constitutional: He is oriented to person, place, and time. He appears well-developed and well-nourished.  Cardiovascular: Normal rate and regular rhythm.   Pulmonary/Chest: Effort normal and breath sounds normal.  Abdominal: Soft. Bowel sounds are normal. There is no tenderness.  Musculoskeletal: Normal range of motion. He exhibits no edema.  Neurological: He is alert and oriented to person, place, and time.      Mallampati Score:     Imaging: No results found.  Labs:  CBC:  Recent Labs  05/04/15 0807 11/02/15 0825 11/11/15 1414 12/01/15 0730  WBC 3.7* 2.3 Repeated and verified X2.* 3.0* 3.1*  HGB 14.6 13.1 14.3 13.9  HCT 43.4 38.3* 39.9 39.8  PLT 205.0 150.0 153 153    COAGS:  Recent Labs  12/01/15 0730  INR 1.02    BMP:  Recent Labs  05/04/15 0807  NA 138  K 4.2  CL 103  CO2 26  GLUCOSE 108*  BUN 13  CALCIUM 9.3  CREATININE 0.94    LIVER FUNCTION TESTS:  Recent Labs  05/04/15 0807  BILITOT 0.8  AST 18  ALT 28  ALKPHOS 49  PROT 6.6  ALBUMIN 4.2    TUMOR MARKERS: No results for input(s): AFPTM, CEA, CA199, CHROMGRNA in the last 8760 hours.  Assessment and Plan: 65 y.o. male with history of long-standing mild leukopenia/neutropenia who presents today for CT-guided bone marrow biopsy for further evaluation.Risks and benefits discussed with the patient/wife including, but not limited to bleeding, infection, damage to adjacent structures or low yield requiring additional tests.All of the patient's questions were answered, patient is agreeable to proceed.Consent signed and in chart.      Thank you for this interesting consult.  I greatly enjoyed meeting Brandon [name] and look forward to participating in their care.  A copy of this report was sent to the requesting provider on this date.  Electronically Signed: D. Rowe Robert 12/01/2015, 8:15 AM   I spent a total of  15 minutes   in face to face in clinical consultation, greater than 50% of which was counseling/coordinating care for CT guided bone marrow biopsy

## 2015-12-01 NOTE — Sedation Documentation (Signed)
Patient denies pain and is resting comfortably.  

## 2015-12-01 NOTE — Procedures (Signed)
Interventional Radiology Procedure Note  Procedure: CT guided aspirate and core biopsy of right iliac bone Complications: None Recommendations: - Bedrest supine x 1 hr - Follow biopsy results  Ellin Fitzgibbons T. Jorgia Manthei, M.D Pager:  [phone]   

## 2015-12-01 NOTE — Discharge Instructions (Signed)
Moderate Conscious Sedation, Adult, Care After °Refer to this sheet in the next few weeks. These instructions provide you with information on caring for yourself after your procedure. Your health care provider may also give you more specific instructions. Your treatment has been planned according to current medical practices, but problems sometimes occur. Call your health care provider if you have any problems or questions after your procedure. °WHAT TO EXPECT AFTER THE PROCEDURE  °After your procedure: °· You may feel sleepy, clumsy, and have poor balance for several hours. °· Vomiting may occur if you eat too soon after the procedure. °HOME CARE INSTRUCTIONS °· Do not participate in any activities where you could become injured for at least 24 hours. Do not: °¨ Drive. °¨ Swim. °¨ Ride a bicycle. °¨ Operate heavy machinery. °¨ Cook. °¨ Use power tools. °¨ Climb ladders. °¨ Work from a high place. °· Do not make important decisions or sign legal documents until you are improved. °· If you vomit, drink water, juice, or soup when you can drink without vomiting. Make sure you have little or no nausea before eating solid foods. °· Only take over-the-counter or prescription medicines for pain, discomfort, or fever as directed by your health care provider. °· Make sure you and your family fully understand everything about the medicines given to you, including what side effects may occur. °· You should not drink alcohol, take sleeping pills, or take medicines that cause drowsiness for at least 24 hours. °· If you smoke, do not smoke without supervision. °· If you are feeling better, you may resume normal activities 24 hours after you were sedated. °· Keep all appointments with your health care provider. °SEEK MEDICAL CARE IF: °· Your skin is pale or bluish in color. °· You continue to feel nauseous or vomit. °· Your pain is getting worse and is not helped by medicine. °· You have bleeding or swelling. °· You are still  sleepy or feeling clumsy after 24 hours. °SEEK IMMEDIATE MEDICAL CARE IF: °· You develop a rash. °· You have difficulty breathing. °· You develop any type of allergic problem. °· You have a fever. °MAKE SURE YOU: °· Understand these instructions. °· Will watch your condition. °· Will get help right away if you are not doing well or get worse. °  °This information is not intended to replace advice given to you by your health care provider. Make sure you discuss any questions you have with your health care provider. °  °Document Released: 04/01/2013 Document Revised: 07/02/2014 Document Reviewed: 04/01/2013 °Elsevier Interactive Patient Education ©2016 [employer] °Bone Marrow Aspiration and Bone Marrow Biopsy, Care After °Refer to this sheet in the next few weeks. These instructions provide you with information about caring for yourself after your procedure. Your health care provider may also give you more specific instructions. Your treatment has been planned according to current medical practices, but problems sometimes occur. Call your health care provider if you have any problems or questions after your procedure. °WHAT TO EXPECT AFTER THE PROCEDURE °After your procedure, it is common to have: °· Soreness or tenderness around the puncture site. °· Bruising. °HOME CARE INSTRUCTIONS °· Take medicines only as directed by your health care provider. °· Follow your health care provider's instructions about: °¨ Puncture site care. °¨ Bandage (dressing) changes and removal. °· Bathe and shower as directed by your health care provider. °· Check your puncture site every day for signs of infection. Watch for: °¨ Redness, swelling, or pain. °¨   Fluid, blood, or pus. °· Return to your normal activities as directed by your health care provider. °· Keep all follow-up visits as directed by your health care provider. This is important. °SEEK MEDICAL CARE IF: °· You have a fever. °· You have uncontrollable bleeding. °· You have  redness, swelling, or pain at the site of your puncture. °· You have fluid, blood, or pus coming from your puncture site. °  °This information is not intended to replace advice given to you by your health care provider. Make sure you discuss any questions you have with your health care provider. °  °Document Released: 12/29/2004 Document Revised: 10/26/2014 Document Reviewed: 06/02/2014 °Elsevier Interactive Patient Education ©2016 Elsevier Inc. ° °

## 2015-12-06 ENCOUNTER — Telehealth: Payer: Self-pay | Admitting: *Deleted

## 2015-12-06 ENCOUNTER — Other Ambulatory Visit: Payer: Self-pay | Admitting: *Deleted

## 2015-12-06 ENCOUNTER — Encounter: Payer: Self-pay | Admitting: Hematology and Oncology

## 2015-12-06 ENCOUNTER — Ambulatory Visit (HOSPITAL_BASED_OUTPATIENT_CLINIC_OR_DEPARTMENT_OTHER): Payer: Medicare Other | Admitting: Hematology and Oncology

## 2015-12-06 DIAGNOSIS — D4622 Refractory anemia with excess of blasts 2: Secondary | ICD-10-CM | POA: Diagnosis not present

## 2015-12-06 DIAGNOSIS — D469 Myelodysplastic syndrome, unspecified: Secondary | ICD-10-CM

## 2015-12-06 DIAGNOSIS — D72829 Elevated white blood cell count, unspecified: Secondary | ICD-10-CM

## 2015-12-06 DIAGNOSIS — Z862 Personal history of diseases of the blood and blood-forming organs and certain disorders involving the immune mechanism: Secondary | ICD-10-CM | POA: Insufficient documentation

## 2015-12-06 NOTE — Telephone Encounter (Signed)
Please call patient about WBC. Patient has questions.       PLEASE NOTE: All timestamps contained within this report are represented as [place] Standard Time. CONFIDENTIALTY NOTICE: This fax transmission is intended only for the addressee. It contains information that is legally privileged, confidential or otherwise protected from use or disclosure. If you are not the intended recipient, you are strictly prohibited from reviewing, disclosing, copying using or disseminating any of this information or taking any action in reliance on or regarding this information. If you have received this fax in error, please notify us immediately by telephone so that we can arrange for its return to [place]. Phone: [phone], Toll-Free: [phone], Fax: [phone] Page: 1 of 1 Call Id: [id number] [facility] - Client [place] Patient Name: Brandon [name] Gender: Male DOB: 10-Jul-1950 Age: 65 Y 70 M 9 D Return Phone Number: [phone] (Primary) Address: City/State/Zip: [place] [facility] - Client Client Site [facility] - Night Physician Garret Reddish - MD Contact Type Call Who Is Calling Patient / Member / Family / Caregiver Call Type Triage / Clinical Relationship To Patient Self Return Phone Number [phone] (Primary) Chief Complaint Paging or Request for Consult Reason for Call Symptomatic / Request for Health Information Initial Comment The caller Dr. Yong Channel is treating the PT for a low White Blood Count. He has some results from the Oncologist he wanted to discuss with Dr. Yong Channel before he visits the Oncologist tomorrow. Translation No Nurse Assessment Nurse: Wynetta Emery, RN, [name] Date/Time Eilene Ghazi Time): 12/05/2015 7:43:20 PM Confirm and document reason for call. If symptomatic, describe symptoms. You must click the next button to save text entered. ---Brandon [name] states he  was trying to reach Dr. Yong Channel wanting to speak with him regarding some test results he got back Has the patient traveled out of the country within the last 30 days? ---No Does the patient have any new or worsening symptoms? ---No Please document clinical information provided and list any resource used. ---Nurse pondered about this and feels that Dr. Yong Channel would want to speak with [name] so called Dr. Ronney Lion phone and left voice mail of [name]'s need to speak with him . Nurse home she did not step out of bounds knowing Dr. Yong Channel was not on call this evening. Guidelines Guideline Title Affirmed Question Affirmed Notes Nurse Date/Time (Eastern Time) Disp. Time Eilene Ghazi Time) Disposition Final User 12/05/2015 7:46:28 PM Clinical Call Yes Wynetta Emery, RN, [name]

## 2015-12-06 NOTE — Telephone Encounter (Signed)
Spoke with patient last night. [facility] called me at home.

## 2015-12-06 NOTE — Progress Notes (Signed)
Patient Care Team: [name], MD as PCP - General (Family Medicine)  DIAGNOSIS: MDS refractory anemia with excess blasts-2  SUMMARY OF ONCOLOGIC HISTORY:   MDS (myelodysplastic syndrome) ([name])   12/01/2015 Initial Diagnosis MDS-RAEB-2: Bone marrow aspiration biopsy: Hypocellular 10-30% severity, increased blasts 60% on aspirate and 20% on biopsy by CD34 staining, flow failed to reveal increase in blasts (could be due to sampling), mild dysplasia megakaryocytes and erythrocyt    CHIEF COMPLIANT: Follow-up after recent bone marrow biopsy  INTERVAL HISTORY: Brandon [name] is a 65 year old who was referred to me for evaluation of mild leukopenia. He had mild leukopenia for about a year. He was completely asymptomatic without any problems or concerns. She did not have any enlarged liver or spleen and he did not have any symptoms of fatigue fevers chills weight loss or night sweats. Initial workup with peripheral blood flow cytometry was normal. X-91 folic acid tests were normal. ANA was +1 in 80. Bone marrow biopsy was performed which surprisingly came back as MDS refractory anemia with excess blasts ranging from 16-20%. I asked the patient to come in urgently to be seen so that we can discuss this result and plan his further care. Other than mild rib discomfort he has no other symptoms.  REVIEW OF SYSTEMS:   Constitutional: Denies fevers, chills or abnormal weight loss Eyes: Denies blurriness of vision Ears, nose, mouth, throat, and face: Denies mucositis or sore throat Respiratory: Denies cough, dyspnea or wheezes Cardiovascular: Denies palpitation, chest discomfort Gastrointestinal:  Denies nausea, heartburn or change in bowel habits Skin: Denies abnormal skin rashes Lymphatics: Denies new lymphadenopathy or easy bruising Neurological:Denies numbness, tingling or new weaknesses Behavioral/Psych: Mood is stable, no new changes  Extremities: No lower extremity edema All other systems  were reviewed with the patient and are negative.  I have reviewed the past medical history, past surgical history, social history and family history with the patient and they are unchanged from previous note.  ALLERGIES:  has No Known Allergies.  MEDICATIONS:  Current Outpatient Prescriptions  Medication Sig Dispense Refill  . acetaminophen (TYLENOL) 325 MG tablet Take 2 tablets (650 mg total) by mouth every 4 (four) hours as needed for mild pain, moderate pain or headache.    Marland Kitchen aspirin 81 MG tablet Take 81 mg by mouth daily.      Marland Kitchen atenolol (TENORMIN) 50 MG tablet Take 1 tablet (50 mg total) by mouth daily. 90 tablet 0  . folic acid (FOLVITE) 478 MCG tablet Take 1 tablet (400 mcg total) by mouth daily. 30 tablet 1  . Multiple Vitamin (MULTIVITAMIN) capsule Take 1 capsule by mouth daily.      Marland Kitchen omeprazole (PRILOSEC OTC) 20 MG tablet take 1 tablet by mouth once daily 90 tablet 3  . sildenafil (REVATIO) 20 MG tablet Take 2 tablets (40 mg total) by mouth daily as needed. 60 tablet 5  . simvastatin (ZOCOR) 40 MG tablet Take 1 tablet (40 mg total) by mouth daily. 90 tablet 2  . Sodium Fluoride (PREVIDENT 5000 BOOSTER PLUS DT) Place 1 application onto teeth 2 (two) times daily.     No current facility-administered medications for this visit.    PHYSICAL EXAMINATION: ECOG PERFORMANCE STATUS: 0 - Asymptomatic  GENERAL:alert, no distress and comfortable SKIN: skin color, texture, turgor are normal, no rashes or significant lesions EYES: normal, Conjunctiva are pink and non-injected, sclera clear OROPHARYNX:no exudate, no erythema and lips, buccal mucosa, and tongue normal  NECK: supple, thyroid normal size,  non-tender, without nodularity LYMPH:  no palpable lymphadenopathy in the cervical, axillary or inguinal LUNGS: clear to auscultation and percussion with normal breathing effort HEART: regular rate & rhythm and no murmurs and no lower extremity edema ABDOMEN:abdomen soft, non-tender and  normal bowel sounds MUSCULOSKELETAL:no cyanosis of digits and no clubbing  NEURO: alert & oriented x 3 with fluent speech, no focal motor/sensory deficits EXTREMITIES: No lower extremity edema  LABORATORY DATA:  I have reviewed the data as listed   Chemistry      Component Value Date/Time   NA 139 12/01/2015 0730   K 4.1 12/01/2015 0730   CL 108 12/01/2015 0730   CO2 24 12/01/2015 0730   BUN 14 12/01/2015 0730   CREATININE 0.98 12/01/2015 0730      Component Value Date/Time   CALCIUM 9.0 12/01/2015 0730   ALKPHOS 49 05/04/2015 0807   AST 18 05/04/2015 0807   ALT 28 05/04/2015 0807   BILITOT 0.8 05/04/2015 0807       Lab Results  Component Value Date   WBC 3.1* 12/01/2015   HGB 13.9 12/01/2015   HCT 39.8 12/01/2015   MCV 97.5 12/01/2015   PLT 153 12/01/2015   NEUTROABS 1.6* 12/01/2015     ASSESSMENT & PLAN:  MDS (myelodysplastic syndrome) (HCC) Myelodysplastic syndrome RAEB-2: Based on bone marrow biopsy on 12/01/2015 showing hypocellular bone marrow with excess CD34 positive blasts exceeding percent on aspirate and 20% on biopsy with mild megakaryocyte and erythroid dysplasia.  Pathology review: I discussed with the patient extensively regarding myelodysplastic syndrome and the difference between MDS and AML. MDS FISH panel is pending along with cytogenetics.  Recommendation: 1. Referral to [facility] leukemia clinic. I discussed the case with Dr. Barbette Or who thought it was reasonable for the patient to be seen for high-grade myelodysplastic syndrome as an outpatient within a week. Patient does not have thrombocytopenia or anemia and is clinically completely asymptomatic.  I reviewed his blood work and it appears that he has mild leukopenia this started about a year ago. If the patient requires outpatient therapy with Vidaza, we would be happy to facilitate his care locally. If however he needs to be treated at [facility], we would be happy to assist with any requirements for  transfusion support or any other supportive care needs.  I would anticipate the hematology/leukemia service at [facility] to take over his care. We are happy to assist as needed.  No orders of the defined types were placed in this encounter.   The patient has a good understanding of the overall plan. he agrees with it. he will call with any problems that may develop before the next visit here.   Rulon Eisenmenger, MD 12/06/2015

## 2015-12-06 NOTE — Assessment & Plan Note (Signed)
Myelodysplastic syndrome RAEB-2: Based on bone marrow biopsy on 12/01/2015 showing hypocellular bone marrow with excess CD34 positive blasts exceeding percent on aspirate and 20% on biopsy with mild megakaryocyte and erythroid dysplasia.  Pathology review: I discussed with the patient extensively regarding myelodysplastic syndrome and the difference between MDS and AML. MDS FISH panel is pending along with cytogenetics.  Recommendation: 1. Referral to [facility] leukemia clinic. I discussed the case with Dr. Barbette Or who thought it was reasonable for the patient to be seen for high-grade myelodysplastic syndrome as an outpatient. Patient does not have thrombocytopenia or anemia and is clinically completely asymptomatic.  I reviewed his blood work and it appears that he has mild leukopenia this started about a year ago. If the patient requires outpatient therapy with Vidaza, we would be happy to facilitate his care locally. If however he needs to be treated at [facility], we would be happy to assist with any requirements for transfusion support or any other supportive care needs.  I would anticipate the hematology/leukemia service at [facility] to take over his care. We are happy to assist as needed.

## 2015-12-06 NOTE — Telephone Encounter (Signed)
Faxed records to [facility].

## 2015-12-06 NOTE — Telephone Encounter (Signed)
Dr. Yong Channel, please call pt he needs to discuss some results with you before he sees Oncologist tomorrow. Thanks

## 2015-12-08 DIAGNOSIS — D469 Myelodysplastic syndrome, unspecified: Secondary | ICD-10-CM | POA: Diagnosis not present

## 2015-12-09 ENCOUNTER — Other Ambulatory Visit: Payer: Self-pay | Admitting: Family Medicine

## 2015-12-09 ENCOUNTER — Other Ambulatory Visit: Payer: Self-pay

## 2015-12-09 DIAGNOSIS — I10 Essential (primary) hypertension: Secondary | ICD-10-CM

## 2015-12-09 MED ORDER — ATENOLOL 50 MG PO TABS: 50.0000 mg | ORAL_TABLET | Freq: Every day | ORAL | Status: DC

## 2015-12-09 NOTE — Addendum Note (Signed)
Addended by: Lucianne Lei M on: 12/09/2015 02:45 PM   Modules accepted: Orders

## 2015-12-12 DIAGNOSIS — C9200 Acute myeloblastic leukemia, not having achieved remission: Secondary | ICD-10-CM | POA: Diagnosis not present

## 2015-12-12 DIAGNOSIS — D4622 Refractory anemia with excess of blasts 2: Secondary | ICD-10-CM | POA: Diagnosis not present

## 2015-12-13 LAB — CHROMOSOME ANALYSIS, BONE MARROW

## 2015-12-13 LAB — TISSUE HYBRIDIZATION (BONE MARROW)-NCBH

## 2015-12-14 DIAGNOSIS — D469 Myelodysplastic syndrome, unspecified: Secondary | ICD-10-CM | POA: Diagnosis not present

## 2015-12-19 DIAGNOSIS — Z79899 Other long term (current) drug therapy: Secondary | ICD-10-CM | POA: Diagnosis not present

## 2015-12-19 DIAGNOSIS — D469 Myelodysplastic syndrome, unspecified: Secondary | ICD-10-CM | POA: Diagnosis not present

## 2015-12-19 DIAGNOSIS — Z5111 Encounter for antineoplastic chemotherapy: Secondary | ICD-10-CM | POA: Diagnosis not present

## 2015-12-20 DIAGNOSIS — Z79899 Other long term (current) drug therapy: Secondary | ICD-10-CM | POA: Diagnosis not present

## 2015-12-20 DIAGNOSIS — D469 Myelodysplastic syndrome, unspecified: Secondary | ICD-10-CM | POA: Diagnosis not present

## 2015-12-20 DIAGNOSIS — Z5111 Encounter for antineoplastic chemotherapy: Secondary | ICD-10-CM | POA: Diagnosis not present

## 2015-12-21 ENCOUNTER — Encounter (HOSPITAL_COMMUNITY): Payer: Self-pay

## 2015-12-21 DIAGNOSIS — Z5111 Encounter for antineoplastic chemotherapy: Secondary | ICD-10-CM | POA: Diagnosis not present

## 2015-12-21 DIAGNOSIS — D469 Myelodysplastic syndrome, unspecified: Secondary | ICD-10-CM | POA: Diagnosis not present

## 2015-12-22 ENCOUNTER — Other Ambulatory Visit: Payer: Self-pay | Admitting: *Deleted

## 2015-12-22 ENCOUNTER — Telehealth: Payer: Self-pay | Admitting: *Deleted

## 2015-12-22 DIAGNOSIS — Z79899 Other long term (current) drug therapy: Secondary | ICD-10-CM | POA: Diagnosis not present

## 2015-12-22 DIAGNOSIS — D469 Myelodysplastic syndrome, unspecified: Secondary | ICD-10-CM | POA: Diagnosis not present

## 2015-12-22 DIAGNOSIS — Z5111 Encounter for antineoplastic chemotherapy: Secondary | ICD-10-CM | POA: Diagnosis not present

## 2015-12-22 NOTE — Telephone Encounter (Signed)
Call from [facility] from [facility] that patient needs appt for picc line dsg change next Friday. pof sent to scheduler.

## 2015-12-23 DIAGNOSIS — Z79899 Other long term (current) drug therapy: Secondary | ICD-10-CM | POA: Diagnosis not present

## 2015-12-23 DIAGNOSIS — Z5111 Encounter for antineoplastic chemotherapy: Secondary | ICD-10-CM | POA: Diagnosis not present

## 2015-12-23 DIAGNOSIS — D469 Myelodysplastic syndrome, unspecified: Secondary | ICD-10-CM | POA: Diagnosis not present

## 2015-12-28 DIAGNOSIS — Z7982 Long term (current) use of aspirin: Secondary | ICD-10-CM | POA: Diagnosis not present

## 2015-12-28 DIAGNOSIS — K219 Gastro-esophageal reflux disease without esophagitis: Secondary | ICD-10-CM | POA: Diagnosis not present

## 2015-12-28 DIAGNOSIS — I10 Essential (primary) hypertension: Secondary | ICD-10-CM | POA: Diagnosis not present

## 2015-12-28 DIAGNOSIS — D469 Myelodysplastic syndrome, unspecified: Secondary | ICD-10-CM | POA: Diagnosis not present

## 2015-12-28 DIAGNOSIS — E7800 Pure hypercholesterolemia, unspecified: Secondary | ICD-10-CM | POA: Diagnosis not present

## 2015-12-28 DIAGNOSIS — M1990 Unspecified osteoarthritis, unspecified site: Secondary | ICD-10-CM | POA: Diagnosis not present

## 2015-12-28 DIAGNOSIS — Z9221 Personal history of antineoplastic chemotherapy: Secondary | ICD-10-CM | POA: Diagnosis not present

## 2015-12-29 ENCOUNTER — Telehealth: Payer: Self-pay | Admitting: *Deleted

## 2015-12-29 NOTE — Progress Notes (Signed)
Received [facility] [employer] fax informing us that pt called to cancel his appointment scheduled for 12/30/15.  After chart review, it appears appt pt wished to cancel was dressing change appointment for PICC.  I called pt to discuss further and to ensure dressing would be changed within appropriate time frame.  Per pt, he had an appointment at [facility] yesterday at which time they changed his PICC dressing.  Pt states he has another appointment next Wednesday at [place] at which time he will ensure his dressing is changed then as well.  Pt verbalized understanding that we can accommodate these dressing changes should he need them in the future.  Pt appreciative of our assistance and reports he is recovering from his first cycle of chemotherapy and doing okay.  Pt informed to notify us with any other questions or concerns he may have.

## 2015-12-29 NOTE — Telephone Encounter (Signed)
Brandon [name] with [facility] called requesting diagnosis codes.  Provided Leukopenia: D72.819 and MDS: D46.9.

## 2016-01-04 DIAGNOSIS — Z79899 Other long term (current) drug therapy: Secondary | ICD-10-CM | POA: Diagnosis not present

## 2016-01-04 DIAGNOSIS — D46Z Other myelodysplastic syndromes: Secondary | ICD-10-CM | POA: Diagnosis not present

## 2016-01-08 ENCOUNTER — Emergency Department (HOSPITAL_COMMUNITY): Payer: Medicare Other

## 2016-01-08 ENCOUNTER — Encounter (HOSPITAL_COMMUNITY): Payer: Self-pay

## 2016-01-08 ENCOUNTER — Inpatient Hospital Stay (HOSPITAL_COMMUNITY)
Admission: EM | Admit: 2016-01-08 | Discharge: 2016-01-09 | DRG: 810 | Disposition: A | Discharge status: Other Acute Inpatient Hospital | Payer: Medicare Other | Attending: Internal Medicine | Admitting: Internal Medicine

## 2016-01-08 DIAGNOSIS — Z862 Personal history of diseases of the blood and blood-forming organs and certain disorders involving the immune mechanism: Secondary | ICD-10-CM | POA: Diagnosis present

## 2016-01-08 DIAGNOSIS — K649 Unspecified hemorrhoids: Secondary | ICD-10-CM | POA: Diagnosis present

## 2016-01-08 DIAGNOSIS — D709 Neutropenia, unspecified: Principal | ICD-10-CM | POA: Diagnosis present

## 2016-01-08 DIAGNOSIS — D469 Myelodysplastic syndrome, unspecified: Secondary | ICD-10-CM | POA: Diagnosis not present

## 2016-01-08 DIAGNOSIS — Z9221 Personal history of antineoplastic chemotherapy: Secondary | ICD-10-CM

## 2016-01-08 DIAGNOSIS — E785 Hyperlipidemia, unspecified: Secondary | ICD-10-CM | POA: Diagnosis present

## 2016-01-08 DIAGNOSIS — R509 Fever, unspecified: Secondary | ICD-10-CM | POA: Diagnosis not present

## 2016-01-08 DIAGNOSIS — I10 Essential (primary) hypertension: Secondary | ICD-10-CM | POA: Diagnosis not present

## 2016-01-08 DIAGNOSIS — R5081 Fever presenting with conditions classified elsewhere: Secondary | ICD-10-CM | POA: Diagnosis not present

## 2016-01-08 DIAGNOSIS — T380X5A Adverse effect of glucocorticoids and synthetic analogues, initial encounter: Secondary | ICD-10-CM

## 2016-01-08 DIAGNOSIS — Z820 Family history of epilepsy and other diseases of the nervous system: Secondary | ICD-10-CM

## 2016-01-08 DIAGNOSIS — Z823 Family history of stroke: Secondary | ICD-10-CM

## 2016-01-08 DIAGNOSIS — Z803 Family history of malignant neoplasm of breast: Secondary | ICD-10-CM

## 2016-01-08 DIAGNOSIS — R739 Hyperglycemia, unspecified: Secondary | ICD-10-CM | POA: Diagnosis present

## 2016-01-08 LAB — I-STAT CG4 LACTIC ACID, ED: Lactic Acid, Venous: 1.53 mmol/L (ref 0.5–1.9)

## 2016-01-08 NOTE — ED Notes (Signed)
Dr. Venora Maples requested for pt to be moved to hallway on Pod A.

## 2016-01-08 NOTE — ED Notes (Signed)
Pt transported to xray 

## 2016-01-08 NOTE — ED Notes (Signed)
Pt reports he is treated at [facility] for MDS. He received chemo treatment 2 weeks ago. He took his temp tonight it was 100.5 called triage number who told him to report to ED. Potential contact with croup from 65 year old living in the home.

## 2016-01-09 ENCOUNTER — Encounter (HOSPITAL_COMMUNITY): Payer: Self-pay | Admitting: *Deleted

## 2016-01-09 DIAGNOSIS — R739 Hyperglycemia, unspecified: Secondary | ICD-10-CM

## 2016-01-09 DIAGNOSIS — D46Z Other myelodysplastic syndromes: Secondary | ICD-10-CM | POA: Diagnosis not present

## 2016-01-09 DIAGNOSIS — Z820 Family history of epilepsy and other diseases of the nervous system: Secondary | ICD-10-CM | POA: Diagnosis not present

## 2016-01-09 DIAGNOSIS — M1990 Unspecified osteoarthritis, unspecified site: Secondary | ICD-10-CM | POA: Diagnosis present

## 2016-01-09 DIAGNOSIS — D469 Myelodysplastic syndrome, unspecified: Secondary | ICD-10-CM | POA: Diagnosis not present

## 2016-01-09 DIAGNOSIS — D709 Neutropenia, unspecified: Secondary | ICD-10-CM | POA: Diagnosis not present

## 2016-01-09 DIAGNOSIS — Z452 Encounter for adjustment and management of vascular access device: Secondary | ICD-10-CM | POA: Diagnosis not present

## 2016-01-09 DIAGNOSIS — D701 Agranulocytosis secondary to cancer chemotherapy: Secondary | ICD-10-CM | POA: Diagnosis present

## 2016-01-09 DIAGNOSIS — E785 Hyperlipidemia, unspecified: Secondary | ICD-10-CM | POA: Diagnosis present

## 2016-01-09 DIAGNOSIS — D4622 Refractory anemia with excess of blasts 2: Secondary | ICD-10-CM | POA: Diagnosis present

## 2016-01-09 DIAGNOSIS — Z9221 Personal history of antineoplastic chemotherapy: Secondary | ICD-10-CM | POA: Diagnosis not present

## 2016-01-09 DIAGNOSIS — R5081 Fever presenting with conditions classified elsewhere: Secondary | ICD-10-CM | POA: Diagnosis not present

## 2016-01-09 DIAGNOSIS — Z823 Family history of stroke: Secondary | ICD-10-CM | POA: Diagnosis not present

## 2016-01-09 DIAGNOSIS — K644 Residual hemorrhoidal skin tags: Secondary | ICD-10-CM | POA: Diagnosis present

## 2016-01-09 DIAGNOSIS — I10 Essential (primary) hypertension: Secondary | ICD-10-CM

## 2016-01-09 DIAGNOSIS — K649 Unspecified hemorrhoids: Secondary | ICD-10-CM | POA: Diagnosis present

## 2016-01-09 DIAGNOSIS — Z803 Family history of malignant neoplasm of breast: Secondary | ICD-10-CM | POA: Diagnosis not present

## 2016-01-09 DIAGNOSIS — K5900 Constipation, unspecified: Secondary | ICD-10-CM | POA: Diagnosis present

## 2016-01-09 DIAGNOSIS — E7800 Pure hypercholesterolemia, unspecified: Secondary | ICD-10-CM | POA: Diagnosis present

## 2016-01-09 DIAGNOSIS — Z5111 Encounter for antineoplastic chemotherapy: Secondary | ICD-10-CM | POA: Diagnosis not present

## 2016-01-09 DIAGNOSIS — T451X5A Adverse effect of antineoplastic and immunosuppressive drugs, initial encounter: Secondary | ICD-10-CM | POA: Diagnosis present

## 2016-01-09 DIAGNOSIS — R509 Fever, unspecified: Secondary | ICD-10-CM | POA: Diagnosis not present

## 2016-01-09 DIAGNOSIS — K219 Gastro-esophageal reflux disease without esophagitis: Secondary | ICD-10-CM | POA: Diagnosis present

## 2016-01-09 LAB — COMPREHENSIVE METABOLIC PANEL
ALT: 31 U/L (ref 17–63)
ANION GAP: 9 (ref 5–15)
AST: 24 U/L (ref 15–41)
Albumin: 4.1 g/dL (ref 3.5–5.0)
Alkaline Phosphatase: 57 U/L (ref 38–126)
BILIRUBIN TOTAL: 1 mg/dL (ref 0.3–1.2)
BUN: 10 mg/dL (ref 6–20)
CO2: 21 mmol/L — AB (ref 22–32)
Calcium: 9.2 mg/dL (ref 8.9–10.3)
Chloride: 104 mmol/L (ref 101–111)
Creatinine, Ser: 0.97 mg/dL (ref 0.61–1.24)
GFR calc Af Amer: >60 mL/min (ref >60)
GFR calc non Af Amer: >60 mL/min (ref >60)
GLUCOSE: 127 mg/dL — AB (ref 65–99)
POTASSIUM: 3.5 mmol/L (ref 3.5–5.1)
SODIUM: 134 mmol/L — AB (ref 135–145)
Total Protein: 7.1 g/dL (ref 6.5–8.1)

## 2016-01-09 LAB — CBC WITH DIFFERENTIAL/PLATELET
Basophils Absolute: 0 10*3/uL (ref 0.0–0.1)
Basophils Absolute: 0 10*3/uL (ref 0.0–0.1)
Basophils Relative: 0 %
Basophils Relative: 0 %
EOS PCT: 1 %
Eosinophils Absolute: 0 10*3/uL (ref 0.0–0.7)
Eosinophils Absolute: 0 10*3/uL (ref 0.0–0.7)
Eosinophils Relative: 1 %
HCT: 37.1 % — ABNORMAL LOW (ref 39.0–52.0)
HCT: 33.3 % — ABNORMAL LOW (ref 39.0–52.0)
Hemoglobin: 13 g/dL (ref 13.0–17.0)
Hemoglobin: 11.2 g/dL — ABNORMAL LOW (ref 13.0–17.0)
LYMPHS ABS: 0.9 10*3/uL (ref 0.7–4.0)
Lymphocytes Relative: 84 %
Lymphocytes Relative: 84 %
Lymphs Abs: 1 10*3/uL (ref 0.7–4.0)
MCH: 33.8 pg (ref 26.0–34.0)
MCH: 32.8 pg (ref 26.0–34.0)
MCHC: 35 g/dL (ref 30.0–36.0)
MCHC: 33.6 g/dL (ref 30.0–36.0)
MCV: 96.4 fL (ref 78.0–100.0)
MCV: 97.7 fL (ref 78.0–100.0)
MONO ABS: 0 10*3/uL — AB (ref 0.1–1.0)
Monocytes Absolute: 0 10*3/uL — ABNORMAL LOW (ref 0.1–1.0)
Monocytes Relative: 1 %
Monocytes Relative: 1 %
Neutro Abs: 0.2 10*3/uL — ABNORMAL LOW (ref 1.7–7.7)
Neutro Abs: 0.2 10*3/uL — ABNORMAL LOW (ref 1.7–7.7)
Neutrophils Relative %: 15 %
Neutrophils Relative %: 14 %
PLATELETS: 131 10*3/uL — AB (ref 150–400)
Platelets: 163 10*3/uL (ref 150–400)
RBC: 3.85 MIL/uL — ABNORMAL LOW (ref 4.22–5.81)
RBC: 3.41 MIL/uL — AB (ref 4.22–5.81)
RDW: 13.1 % (ref 11.5–15.5)
RDW: 13.3 % (ref 11.5–15.5)
WBC: 1.2 10*3/uL — CL (ref 4.0–10.5)
WBC: 1.1 10*3/uL — AB (ref 4.0–10.5)

## 2016-01-09 LAB — HEPATIC FUNCTION PANEL
ALT: 26 U/L (ref 17–63)
AST: 19 U/L (ref 15–41)
Albumin: 3.5 g/dL (ref 3.5–5.0)
Alkaline Phosphatase: 48 U/L (ref 38–126)
BILIRUBIN INDIRECT: 1 mg/dL — AB (ref 0.3–0.9)
Bilirubin, Direct: 0.2 mg/dL (ref 0.1–0.5)
TOTAL PROTEIN: 5.8 g/dL — AB (ref 6.5–8.1)
Total Bilirubin: 1.2 mg/dL (ref 0.3–1.2)

## 2016-01-09 LAB — URINALYSIS, ROUTINE W REFLEX MICROSCOPIC
Bilirubin Urine: NEGATIVE
Glucose, UA: NEGATIVE mg/dL
Hgb urine dipstick: NEGATIVE
Ketones, ur: NEGATIVE mg/dL
Leukocytes, UA: NEGATIVE
Nitrite: NEGATIVE
Protein, ur: NEGATIVE mg/dL
Specific Gravity, Urine: 1.018 (ref 1.005–1.030)
pH: 6 (ref 5.0–8.0)

## 2016-01-09 LAB — BASIC METABOLIC PANEL
ANION GAP: 6 (ref 5–15)
BUN: 8 mg/dL (ref 6–20)
CO2: 25 mmol/L (ref 22–32)
Calcium: 8.5 mg/dL — ABNORMAL LOW (ref 8.9–10.3)
Chloride: 103 mmol/L (ref 101–111)
Creatinine, Ser: 0.87 mg/dL (ref 0.61–1.24)
GFR calc Af Amer: >60 mL/min (ref >60)
Glucose, Bld: 123 mg/dL — ABNORMAL HIGH (ref 65–99)
POTASSIUM: 3.5 mmol/L (ref 3.5–5.1)
SODIUM: 134 mmol/L — AB (ref 135–145)

## 2016-01-09 LAB — PATHOLOGIST SMEAR REVIEW

## 2016-01-09 LAB — I-STAT CG4 LACTIC ACID, ED: Lactic Acid, Venous: 1.08 mmol/L (ref 0.5–1.9)

## 2016-01-09 MED ORDER — VANCOMYCIN HCL IN DEXTROSE 1-5 GM/200ML-% IV SOLN
1000.0000 mg | Freq: Three times a day (TID) | INTRAVENOUS | Status: DC
  Administered 2016-01-09 (×2): 1000 mg via INTRAVENOUS
  Filled 2016-01-09 (×3): qty 200

## 2016-01-09 MED ORDER — POLYETHYLENE GLYCOL 3350 17 G PO PACK
17.0000 g | PACK | Freq: Every day | ORAL | Status: DC | PRN
Start: 2016-01-09 — End: 2016-01-10

## 2016-01-09 MED ORDER — SIMVASTATIN 40 MG PO TABS
40.0000 mg | ORAL_TABLET | Freq: Every day | ORAL | Status: DC
  Administered 2016-01-09: 40 mg via ORAL
  Filled 2016-01-09: qty 1

## 2016-01-09 MED ORDER — PANTOPRAZOLE SODIUM 40 MG PO TBEC
40.0000 mg | DELAYED_RELEASE_TABLET | Freq: Every day | ORAL | Status: DC
  Administered 2016-01-09: 40 mg via ORAL
  Filled 2016-01-09: qty 1

## 2016-01-09 MED ORDER — DEXTROSE 5 % IV SOLN: 2.0000 g | Freq: Three times a day (TID) | INTRAVENOUS | Status: DC

## 2016-01-09 MED ORDER — ONDANSETRON HCL 4 MG/2ML IJ SOLN: 4.0000 mg | Freq: Four times a day (QID) | INTRAMUSCULAR | Status: DC | PRN

## 2016-01-09 MED ORDER — ENOXAPARIN SODIUM 40 MG/0.4ML ~~LOC~~ SOLN
40.0000 mg | SUBCUTANEOUS | Status: DC
  Filled 2016-01-09: qty 0.4

## 2016-01-09 MED ORDER — ONDANSETRON HCL 4 MG PO TABS: 4.0000 mg | ORAL_TABLET | Freq: Four times a day (QID) | ORAL | Status: DC | PRN

## 2016-01-09 MED ORDER — HYDROCORTISONE 2.5 % RE CREA
TOPICAL_CREAM | Freq: Every day | RECTAL | Status: DC | PRN
  Filled 2016-01-09: qty 28.35

## 2016-01-09 MED ORDER — ACETAMINOPHEN 325 MG PO TABS
650.0000 mg | ORAL_TABLET | Freq: Four times a day (QID) | ORAL | Status: DC | PRN
  Administered 2016-01-09: 650 mg via ORAL
  Filled 2016-01-09: qty 2

## 2016-01-09 MED ORDER — CEFEPIME HCL 2 G IJ SOLR
2.0000 g | Freq: Once | INTRAMUSCULAR | Status: AC
  Administered 2016-01-09: 2 g via INTRAVENOUS
  Filled 2016-01-09: qty 2

## 2016-01-09 MED ORDER — DEXTROSE 5 % IV SOLN
2.0000 g | Freq: Three times a day (TID) | INTRAVENOUS | Status: DC
  Administered 2016-01-09 (×2): 2 g via INTRAVENOUS
  Filled 2016-01-09 (×3): qty 2

## 2016-01-09 MED ORDER — OMEPRAZOLE MAGNESIUM 20 MG PO TBEC: 40.0000 mg | DELAYED_RELEASE_TABLET | ORAL | Status: DC

## 2016-01-09 MED ORDER — ACETAMINOPHEN 650 MG RE SUPP: 650.0000 mg | Freq: Four times a day (QID) | RECTAL | Status: DC | PRN

## 2016-01-09 MED ORDER — SENNA 8.6 MG PO TABS
1.0000 | ORAL_TABLET | Freq: Every day | ORAL | Status: DC
  Administered 2016-01-09: 8.6 mg via ORAL
  Filled 2016-01-09: qty 1

## 2016-01-09 MED ORDER — ADULT MULTIVITAMIN W/MINERALS CH
1.0000 | ORAL_TABLET | Freq: Every day | ORAL | Status: DC
  Administered 2016-01-09: 1 via ORAL
  Filled 2016-01-09: qty 1

## 2016-01-09 MED ORDER — SODIUM CHLORIDE 0.9 % IV SOLN
1250.0000 mg | Freq: Once | INTRAVENOUS | Status: AC
  Administered 2016-01-09: 1250 mg via INTRAVENOUS
  Filled 2016-01-09 (×2): qty 1250

## 2016-01-09 MED ORDER — SODIUM CHLORIDE 0.9 % IV SOLN: INTRAVENOUS | Status: DC

## 2016-01-09 MED ORDER — VANCOMYCIN HCL IN DEXTROSE 1-5 GM/200ML-% IV SOLN: 1000.0000 mg | Freq: Three times a day (TID) | INTRAVENOUS | Status: DC

## 2016-01-09 MED ORDER — ATENOLOL 50 MG PO TABS
50.0000 mg | ORAL_TABLET | Freq: Every day | ORAL | Status: DC
  Administered 2016-01-09: 50 mg via ORAL
  Filled 2016-01-09: qty 1

## 2016-01-09 MED ORDER — HYDROCORTISONE 2.5 % RE CREA: TOPICAL_CREAM | Freq: Two times a day (BID) | RECTAL | Status: DC

## 2016-01-09 MED ORDER — FOLIC ACID 400 MCG PO TABS: 400.0000 ug | ORAL_TABLET | Freq: Every day | ORAL | Status: DC

## 2016-01-09 MED ORDER — SODIUM CHLORIDE 0.9% FLUSH
10.0000 mL | INTRAVENOUS | Status: DC | PRN
  Administered 2016-01-09: 20 mL
  Filled 2016-01-09: qty 40

## 2016-01-09 MED ORDER — FOLIC ACID 1 MG PO TABS
1.0000 mg | ORAL_TABLET | Freq: Every day | ORAL | Status: DC
  Administered 2016-01-09: 1 mg via ORAL
  Filled 2016-01-09: qty 1

## 2016-01-09 MED ORDER — HYDROCORTISONE 2.5 % RE CREA
TOPICAL_CREAM | Freq: Two times a day (BID) | RECTAL | Status: DC
  Administered 2016-01-09: 11:00:00 via RECTAL
  Filled 2016-01-09: qty 28.35

## 2016-01-09 NOTE — ED Notes (Signed)
Dr. Campos at bedside   

## 2016-01-09 NOTE — Discharge Summary (Signed)
Physician Discharge Summary  Brandon [name] [id number] DOB: Apr 08, 1951 DOA: 01/08/2016  PCP: Garret Reddish, MD  Admit date: 01/08/2016 Discharge date: 01/09/2016   Recommendations for Outpatient Follow-Up:   1. transfer to [facility]   Discharge Diagnosis:   Principal Problem:   Neutropenic fever ([place]) Active Problems:   Essential hypertension   Hyperglycemia   MDS (myelodysplastic syndrome) ([place])   Discharge disposition:  stable  Discharge Condition: Improved.  Diet recommendation: Low sodium, heart healthy  Wound care: None.   History of Present Illness:   *Brandon [name] is a 65 y.o. male with myelodysplastic syndrome status post chemotherapy 3 weeks ago at [facility] presents to the ER because of fever and chills. Patient started developing fever chills last night around 9 PM. Patient also has a PICC line for chemotherapy. The point of entry of the PICC line, looks slightly discolored. Patient also states he has also come in contact with his grandson who has suffered respiratory tract infection. Over the last 2 days patient also has been having some hemorrhoidal pain while moving bowels. In the ER patient has a fever of 101.66F. Chest x-ray and UA are unremarkable. Patient's neutrophil count is less than 500. Patient is being admitted for febrile neutropenia. ER physician did discuss with oncologist at [facility] and [facility] are [facility] Course by Problem:   Febrile neutropenia -  -vancomycin and cefepime. - Follow blood cultures- NGTD. If positive may have to remove patient's PICC line.  -await transfer to [facility] when bed available  Hemorrhoids - on exam patient does have hemorrhoids but there is no acute tenderness on palpation. - Anusol cream.  Myelodysplastic syndrome - on chemotherapy. Being followed at [facility]  Hypertension  -watch BP.  Hyperlipidemia on statins.    Medical  Consultants:    None.   Discharge Exam:   Filed Vitals:   01/09/16 0700 01/09/16 0852  BP: 122/66 108/68  Pulse: 75 79  Temp: 99.1 F (37.3 C) 98.6 F (37 C)  Resp: 16 18   Filed Vitals:   01/09/16 0330 01/09/16 0433 01/09/16 0700 01/09/16 0852  BP: 131/81 128/76 122/66 108/68  Pulse: 77 84 75 79  Temp:  99.1 F (37.3 C) 99.1 F (37.3 C) 98.6 F (37 C)  TempSrc:  Oral Oral Oral  Resp:  18 16 18   Height:  5\' 11"  (1.803 m)    Weight:  92.987 kg (205 lb)    SpO2: 97% 99% 97% 97%    Gen:  NAD    The results of significant diagnostics from this hospitalization (including imaging, microbiology, ancillary and laboratory) are listed below for reference.     Procedures and Diagnostic Studies:   Dg Chest 2 View  01/08/2016  CLINICAL DATA:  Fever. EXAM: CHEST  2 VIEW COMPARISON:  August 12, 2014 FINDINGS: There is a vascular catheter terminating in the central SVC, probably a left PICC line. Recommend clinical correlation. No pneumothorax. The heart, hila, and mediastinum are normal. No pulmonary nodules, masses, or focal infiltrates. IMPRESSION: No cause for fever identified. Electronically Signed   By: Dorise Bullion III M.D   On: 01/08/2016 23:57     Labs:   Basic Metabolic Panel:  Recent Labs Lab 01/08/16 2301 01/09/16 0525  NA 134* 134*  K 3.5 3.5  CL 104 103  CO2 21* 25  GLUCOSE 127* 123*  BUN 10 8  CREATININE 0.97 0.87  CALCIUM 9.2 8.5*   GFR Estimated Creatinine Clearance: 97.3 mL/min (by C-G formula based on Cr of 0.87). Liver Function Tests:  Recent Labs Lab 01/08/16 2301 01/09/16 0525  AST 24 19  ALT 31 26  ALKPHOS 57 48  BILITOT 1.0 1.2  PROT 7.1 5.8*  ALBUMIN 4.1 3.5   No results for input(s): LIPASE, AMYLASE in the last 168 hours. No results for input(s): AMMONIA in the last 168 hours. Coagulation profile No results for input(s): INR, PROTIME in the last 168 hours.  CBC:  Recent Labs Lab 01/08/16 2301 01/09/16 0525  WBC  1.2* 1.1*  NEUTROABS 0.2* 0.2*  HGB 13.0 11.2*  HCT 37.1* 33.3*  MCV 96.4 97.7  PLT 163 131*   Cardiac Enzymes: No results for input(s): CKTOTAL, CKMB, CKMBINDEX, TROPONINI in the last 168 hours. BNP: Invalid input(s): POCBNP CBG: No results for input(s): GLUCAP in the last 168 hours. D-Dimer No results for input(s): DDIMER in the last 72 hours. Hgb A1c No results for input(s): HGBA1C in the last 72 hours. Lipid Profile No results for input(s): CHOL, HDL, LDLCALC, TRIG, CHOLHDL, LDLDIRECT in the last 72 hours. Thyroid function studies No results for input(s): TSH, T4TOTAL, T3FREE, THYROIDAB in the last 72 hours.  Invalid input(s): FREET3 Anemia work up No results for input(s): VITAMINB12, FOLATE, FERRITIN, TIBC, IRON, RETICCTPCT in the last 72 hours. Microbiology No results found for this or any previous visit (from the past 240 hour(s)).   Discharge Instructions:      Medication List    ASK your doctor about these medications        acetaminophen 325 MG tablet  Commonly known as:  TYLENOL  Take 2 tablets (650 mg total) by mouth every 4 (four) hours as needed for mild pain, moderate pain or headache.     atenolol 50 MG tablet  Commonly known as:  TENORMIN  Take 1 tablet (50 mg total) by mouth daily.     folic acid [id number] MCG tablet  Commonly known as:  FOLVITE  Take 1 tablet (400 mcg total) by mouth daily.     multivitamin with minerals Tabs tablet  Take 1 tablet by mouth daily.     omeprazole 20 MG tablet  Commonly known as:  PRILOSEC OTC  take 1 tablet by mouth once daily     polyethylene glycol packet  Commonly known as:  MIRALAX / GLYCOLAX  Take 17 g by mouth daily as needed for mild constipation.     sildenafil 20 MG tablet  Commonly known as:  REVATIO  Take 2 tablets (40 mg total) by mouth daily as needed.     simvastatin 40 MG tablet  Commonly known as:  ZOCOR  Take 1 tablet (40 mg total) by mouth daily.          Time coordinating  discharge: 35 min  Signed:  Armstead Heiland U Romuald Mccaslin   [facility] 01/09/2016, 4:03 PM

## 2016-01-09 NOTE — ED Provider Notes (Signed)
CSN: [id number]     Arrival date & time 01/08/16  2207 History  By signing my name below, I, Jasmyn B. Alexander, attest that this documentation has been prepared under the direction and in the presence of Jola Schmidt, MD.  Electronically Signed: Tedra Coupe. Sheppard Coil, ED Scribe. 01/09/2016. 12:48 AM.  Chief Complaint  Patient presents with  . Fever   The history is provided by the patient. No language interpreter was used.    HPI Comments: Brandon [name] is a 65 y.o. male with PMHx of MDS who presents to the Emergency Department complaining of fever (TMAX 100.7) that began around 2100. Pt states he was recently diagnosed with MDS on 12/06/15 and received first chemo treatment approximately 1.5 weeks ago. He reports that he was told to check his temperature numerous times a day due to diagnosis. Pt does not have any associated symptoms at this time nor does he feel ill. He has been having constipation since start of chemo treatment which is relieved with use of Miralax. He states that he has been sneezing, but it is not related to fever. There are no modifying factors. Dr. Lanier Ensign is his primary oncologist. Spoke with "Dr. Waunita Schooner" PTA regarding current complaint. Pt notes possible sick contact with 30yr old grandson who was recently diagnosed with Croup. Denies any sore throat, nasal congestion, shortness of breath, cough, nausea, vomiting, diarrhea, abdominal pain.   Past Medical History  Diagnosis Date  . HYPERLIPIDEMIA 02/04/2007  . Chronic prostatitis 12/17/2008  . ERECTILE DYSFUNCTION 02/07/2007  . HYPERTENSION 02/04/2007  . Diverticulosis 01/16/2011    History of diverticulitis   . Allergy     seasonal/animals  . NEOPLASM, MALIGNANT, SKIN, BACK 02/09/2009   Past Surgical History  Procedure Laterality Date  . None     Family History  Problem Relation Age of Onset  . Breast cancer Mother   . CVA Mother     quit taking HTN meds  . Alzheimer's disease Father     late 30s. mid 26s  .  Asperger's syndrome Son    Social History  Substance Use Topics  . Smoking status: Never Smoker   . Smokeless tobacco: None  . Alcohol Use: 1.8 oz/week    3 Standard drinks or equivalent per week    Review of Systems  Constitutional: Positive for fever.  HENT: Positive for sneezing. Negative for congestion and sore throat.   Respiratory: Negative for cough and shortness of breath.   Gastrointestinal: Negative for nausea, vomiting, abdominal pain and diarrhea.  All other systems reviewed and are negative.  Allergies  Review of patient's allergies indicates no known allergies.  Home Medications   Prior to Admission medications   Medication Sig Start Date End Date Taking? Authorizing Provider  atenolol (TENORMIN) 50 MG tablet Take 1 tablet (50 mg total) by mouth daily. 12/09/15  Yes [name], MD  folic acid (FOLVITE) A999333 MCG tablet Take 1 tablet (400 mcg total) by mouth daily. 08/15/12  Yes Ricard Dillon, MD  Multiple Vitamin (MULTIVITAMIN WITH MINERALS) TABS tablet Take 1 tablet by mouth daily.   Yes Historical Provider, MD  omeprazole (PRILOSEC OTC) 20 MG tablet take 1 tablet by mouth once daily 09/12/15  Yes [name], MD  polyethylene glycol [facility] / GLYCOLAX) packet Take 17 g by mouth daily as needed for mild constipation.   Yes Historical Provider, MD  simvastatin (ZOCOR) 40 MG tablet Take 1 tablet (40 mg total) by mouth daily. 04/27/15  Yes  [name], MD  acetaminophen (TYLENOL) 325 MG tablet Take 2 tablets (650 mg total) by mouth every 4 (four) hours as needed for mild pain, moderate pain or headache. Patient not taking: Reported on 01/09/2016 08/13/14   Modena Jansky, MD  sildenafil (REVATIO) 20 MG tablet Take 2 tablets (40 mg total) by mouth daily as needed. Patient not taking: Reported on 01/09/2016 11/13/13   Ricard Dillon, MD   BP 131/81 mmHg  Pulse 77  Temp(Src) 100.1 F (37.8 C) (Oral)  Resp 20  Ht 5\' 11"  (1.803 m)  Wt 202 lb (91.627 kg)  BMI  28.19 kg/m2  SpO2 97% Physical Exam  Constitutional: He is oriented to person, place, and time. He appears well-developed and well-nourished.  HENT:  Head: Normocephalic and atraumatic.  Mouth/Throat: No posterior oropharyngeal erythema.  Eyes: EOM are normal.  Neck: Normal range of motion.  Cardiovascular: Normal rate, regular rhythm, normal heart sounds and intact distal pulses.   No murmur heard. Pulmonary/Chest: Effort normal and breath sounds normal. No respiratory distress.  Abdominal: Soft. He exhibits no distension. There is no tenderness.  Musculoskeletal: Normal range of motion.  PICC left upper extremity without surrounding erythema or tenderness  Neurological: He is alert and oriented to person, place, and time.  Skin: Skin is warm and dry.  Psychiatric: He has a normal mood and affect. Judgment normal.  Nursing note and vitals reviewed.   ED Course  Procedures (including critical care time) DIAGNOSTIC STUDIES: Oxygen Saturation is 97% on RA, normal by my interpretation.    COORDINATION OF CARE: 12:46 AM-Discussed treatment plan which includes routine blood culture X 2 and lactic acid with pt at bedside and pt agreed to plan.   Labs Review Labs Reviewed  COMPREHENSIVE METABOLIC PANEL - Abnormal; Notable for the following:    Sodium 134 (*)    CO2 21 (*)    Glucose, Bld 127 (*)    All other components within normal limits  CBC WITH DIFFERENTIAL/PLATELET - Abnormal; Notable for the following:    WBC 1.2 (*)    RBC 3.85 (*)    HCT 37.1 (*)    Neutro Abs 0.2 (*)    Monocytes Absolute 0.0 (*)    All other components within normal limits  CULTURE, BLOOD (ROUTINE X 2)  CULTURE, BLOOD (ROUTINE X 2)  URINE CULTURE  URINALYSIS, ROUTINE W REFLEX MICROSCOPIC (NOT AT [facility])  I-STAT CG4 LACTIC ACID, ED  I-STAT CG4 LACTIC ACID, ED    Imaging Review Dg Chest 2 View  01/08/2016  CLINICAL DATA:  Fever. EXAM: CHEST  2 VIEW COMPARISON:  August 12, 2014 FINDINGS: There is  a vascular catheter terminating in the central SVC, probably a left PICC line. Recommend clinical correlation. No pneumothorax. The heart, hila, and mediastinum are normal. No pulmonary nodules, masses, or focal infiltrates. IMPRESSION: No cause for fever identified. Electronically Signed   By: Dorise Bullion III M.D   On: 01/08/2016 23:57   I have personally reviewed and evaluated these images and lab results as part of my medical decision-making.  MDM   Final diagnoses:  Neutropenic fever ([place])    I spoke with the patient's oncology team at [facility] who recommends neutropenic fever and Biaxin this time.  Vancomycin and cefepime given.  Blood cultures pending.  At this time there is no bed availability at [facility] in there for the patient will be admitted to [facility] for IV antibiotics and management this time with transfer to [facility]  when available  I personally performed the services described in this documentation, which was scribed in my presence. The recorded information has been reviewed and is accurate.       Jola Schmidt, MD 01/10/16 [phone]

## 2016-01-09 NOTE — Progress Notes (Signed)
New Admission Note:  Arrival Method: Stetcher  Mental Orientation: Alert and oriented x4 Telemetry: N/A Assessment: Completed Skin: Warm, dry and intact IV: Left AC, Etablished PICC Left arm  Pain: N/A Tubes: N/A Safety Measures: Safety Fall Prevention Plan was given, discussed and signed. Admission: Completed [address] [place] Orientation: Patient has been orientated to the room, unit and the staff. Family: Wife at bedside Orders have been reviewed and implemented. Will continue to monitor the patient. Call light has been placed within reach and bed alarm has been activated.   Brandon [name] BSN, RN  Phone Number: [phone]

## 2016-01-09 NOTE — Progress Notes (Signed)
Day RN gave report for transfer at [facility] Patient aware and waiting for [facility] for pick up. Alert and oriented. Denies pain or discomfort.

## 2016-01-09 NOTE — H&P (Signed)
History and Physical    Brandon [name] [id number] DOB: 1951/06/18 DOA: 01/08/2016  PCP: Garret Reddish, MD  Patient coming from: Home.  Chief Complaint: Fever and chills.  HPI: Brandon [name] is a 65 y.o. male with myelodysplastic syndrome status post chemotherapy 3 weeks ago at [facility] presents to the ER because of fever and chills. Patient started developing fever chills last night around 9 PM. Patient also has a PICC line for chemotherapy. The point of entry of the PICC line, looks slightly discolored. Patient also states he has also come in contact with his grandson who has suffered respiratory tract infection. Over the last 2 days patient also has been having some hemorrhoidal pain while moving bowels. In the ER patient has a fever of 101.25F. Chest x-ray and UA are unremarkable. Patient's neutrophil count is less than 500. Patient is being admitted for febrile neutropenia. ER physician did discuss with oncologist at [facility] and they are willing to transfer patient to [facility] once bed is available.  ED Course: Blood cultures were obtained and patient started on vancomycin and Zosyn.  Review of Systems: As per HPI, rest all negative.   Past Medical History  Diagnosis Date  . HYPERLIPIDEMIA 02/04/2007  . Chronic prostatitis 12/17/2008  . ERECTILE DYSFUNCTION 02/07/2007  . HYPERTENSION 02/04/2007  . Diverticulosis 01/16/2011    History of diverticulitis   . Allergy     seasonal/animals  . NEOPLASM, MALIGNANT, SKIN, BACK 02/09/2009    Past Surgical History  Procedure Laterality Date  . None       reports that he has never smoked. He does not have any smokeless tobacco history on file. He reports that he drinks about 1.8 oz of alcohol per week. He reports that he does not use illicit drugs.  No Known Allergies  Family History  Problem Relation Age of Onset  . Breast cancer Mother   . CVA Mother     quit taking HTN meds  . Alzheimer's disease  Father     late 85s. mid 45s  . Asperger's syndrome Son     Prior to Admission medications   Medication Sig Start Date End Date Taking? Authorizing Provider  atenolol (TENORMIN) 50 MG tablet Take 1 tablet (50 mg total) by mouth daily. 12/09/15  Yes [name], MD  folic acid (FOLVITE) A999333 MCG tablet Take 1 tablet (400 mcg total) by mouth daily. 08/15/12  Yes Ricard Dillon, MD  Multiple Vitamin (MULTIVITAMIN WITH MINERALS) TABS tablet Take 1 tablet by mouth daily.   Yes Historical Provider, MD  omeprazole (PRILOSEC OTC) 20 MG tablet take 1 tablet by mouth once daily 09/12/15  Yes [name], MD  polyethylene glycol [facility] / GLYCOLAX) packet Take 17 g by mouth daily as needed for mild constipation.   Yes Historical Provider, MD  simvastatin (ZOCOR) 40 MG tablet Take 1 tablet (40 mg total) by mouth daily. 04/27/15  Yes [name], MD  acetaminophen (TYLENOL) 325 MG tablet Take 2 tablets (650 mg total) by mouth every 4 (four) hours as needed for mild pain, moderate pain or headache. Patient not taking: Reported on 01/09/2016 08/13/14   Modena Jansky, MD  sildenafil (REVATIO) 20 MG tablet Take 2 tablets (40 mg total) by mouth daily as needed. Patient not taking: Reported on 01/09/2016 11/13/13   Ricard Dillon, MD    Physical Exam: Filed Vitals:   01/09/16 0230 01/09/16 0300 01/09/16 0330 01/09/16 0433  BP:  131/75 124/74 131/81 128/76  Pulse: 80 76 77 84  Temp:    99.1 F (37.3 C)  TempSrc:    Oral  Resp:    18  Height:    5\' 11"  (1.803 m)  Weight:    205 lb (92.987 kg)  SpO2: 100% 98% 97% 99%      Constitutional: Not in distress. Filed Vitals:   01/09/16 0230 01/09/16 0300 01/09/16 0330 01/09/16 0433  BP: 131/75 124/74 131/81 128/76  Pulse: 80 76 77 84  Temp:    99.1 F (37.3 C)  TempSrc:    Oral  Resp:    18  Height:    5\' 11"  (1.803 m)  Weight:    205 lb (92.987 kg)  SpO2: 100% 98% 97% 99%   Eyes: Anicteric. No pallor. ENMT: No discharge from the ears  eyes nose or mouth. Neck: No mass felt. No neck rigidity. No JVD appreciated. Respiratory: No rhonchi or crepitations. Cardiovascular: S1-S2 heard. Abdomen: Soft nontender bowel sounds present. Musculoskeletal: No edema. Skin: The point of entry at the left arm PICC line area looks mildly discolored. Neurologic: Alert awake oriented to time place and person. Moves all extremities. Psychiatric: Appears normal.   Labs on Admission: I have personally reviewed following labs and imaging studies  CBC:  Recent Labs Lab 01/08/16 2301  WBC 1.2*  NEUTROABS 0.2*  HGB 13.0  HCT 37.1*  MCV 96.4  PLT [id number]   Basic Metabolic Panel:  Recent Labs Lab 01/08/16 2301  NA 134*  K 3.5  CL 104  CO2 21*  GLUCOSE 127*  BUN 10  CREATININE 0.97  CALCIUM 9.2   GFR: Estimated Creatinine Clearance: 87.3 mL/min (by C-G formula based on Cr of 0.97). Liver Function Tests:  Recent Labs Lab 01/08/16 2301  AST 24  ALT 31  ALKPHOS 57  BILITOT 1.0  PROT 7.1  ALBUMIN 4.1   No results for input(s): LIPASE, AMYLASE in the last 168 hours. No results for input(s): AMMONIA in the last 168 hours. Coagulation Profile: No results for input(s): INR, PROTIME in the last 168 hours. Cardiac Enzymes: No results for input(s): CKTOTAL, CKMB, CKMBINDEX, TROPONINI in the last 168 hours. BNP (last 3 results) No results for input(s): PROBNP in the last 8760 hours. HbA1C: No results for input(s): HGBA1C in the last 72 hours. CBG: No results for input(s): GLUCAP in the last 168 hours. Lipid Profile: No results for input(s): CHOL, HDL, LDLCALC, TRIG, CHOLHDL, LDLDIRECT in the last 72 hours. Thyroid Function Tests: No results for input(s): TSH, T4TOTAL, FREET4, T3FREE, THYROIDAB in the last 72 hours. Anemia Panel: No results for input(s): VITAMINB12, FOLATE, FERRITIN, TIBC, IRON, RETICCTPCT in the last 72 hours. Urine analysis:    Component Value Date/Time   COLORURINE YELLOW 01/08/2016 2310    APPEARANCEUR CLEAR 01/08/2016 2310   LABSPEC 1.018 01/08/2016 2310   PHURINE 6.0 01/08/2016 2310   GLUCOSEU NEGATIVE 01/08/2016 2310   GLUCOSEU NEGATIVE 12/23/2009 0742   HGBUR NEGATIVE 01/08/2016 2310   HGBUR negative 01/24/2007 0810   BILIRUBINUR NEGATIVE 01/08/2016 2310   BILIRUBINUR negative 08/10/2014 1057   BILIRUBINUR n 02/11/2013 1059   KETONESUR NEGATIVE 01/08/2016 2310   KETONESUR negative 08/10/2014 1057   PROTEINUR NEGATIVE 01/08/2016 2310   PROTEINUR negative 08/10/2014 1057   PROTEINUR n 02/11/2013 1059   UROBILINOGEN 0.2 08/10/2014 1057   UROBILINOGEN 0.2 12/23/2009 0742   NITRITE NEGATIVE 01/08/2016 2310   NITRITE Negative 08/10/2014 1057   NITRITE n 02/11/2013 1059  LEUKOCYTESUR NEGATIVE 01/08/2016 2310   Sepsis Labs: @LABRCNTIP (procalcitonin:4,lacticidven:4) )No results found for this or any previous visit (from the past 240 hour(s)).   Radiological Exams on Admission: Dg Chest 2 View  01/08/2016  CLINICAL DATA:  Fever. EXAM: CHEST  2 VIEW COMPARISON:  August 12, 2014 FINDINGS: There is a vascular catheter terminating in the central SVC, probably a left PICC line. Recommend clinical correlation. No pneumothorax. The heart, hila, and mediastinum are normal. No pulmonary nodules, masses, or focal infiltrates. IMPRESSION: No cause for fever identified. Electronically Signed   By: Dorise Bullion III M.D   On: 01/08/2016 23:57     Assessment/Plan Principal Problem:   Neutropenic fever ([place]) Active Problems:   Essential hypertension   Hyperglycemia   MDS (myelodysplastic syndrome) ([place])    1. Febrile neutropenia - patient has been placed on vancomycin and cefepime. Follow blood cultures. If positive may have to remove patient's PICC line. ER physician did discuss with oncologist at [facility] who is willing to transfer patient to [facility] once bed available. 2. Hemorrhoids - on exam patient does have hemorrhoids but there is no acute tenderness on palpation. I have  placed patient on Anusol cream. 3. Myelodysplastic syndrome - on chemotherapy. Being followed at [facility]. 4. Hypertension - will continue patient's R. 5. Hyperlipidemia on statins.   DVT prophylaxis: Lovenox. If platelets drop then have to discontinue. Code Status: Full code.  Family Communication: Patient's wife.  Disposition Plan: Home.  Consults called: ER physician did discuss with oncologist at [facility].  Admission status: Inpatient. MedSurg.    Rise Patience MD [facility] Pager [phone].  If 7PM-7AM, please contact night-coverage [email or web address] Password [facility]  01/09/2016, 4:56 AM

## 2016-01-09 NOTE — Progress Notes (Signed)
Pharmacy Antibiotic Note  Brandon [name] is a 65 y.o. male admitted on 01/08/2016 with febrile neutropenia.  Pharmacy has been consulted for vancomycin and cefepime dosing.  Plan: Vancomycin 1250mg  ordered in ED then 1000mg  IV every 8 hours.  Goal trough 15-20 mcg/mL.  Cefepime 2g IV every 8 hours.  Height: 5\' 11"  (180.3 cm) Weight: 205 lb (92.987 kg) IBW/kg (Calculated) : 75.3  Temp (24hrs), Avg:99.6 F (37.6 C), Min:99.1 F (37.3 C), Max:100.1 F (37.8 C)   Recent Labs Lab 01/08/16 2301 01/08/16 2344 01/09/16 0224  WBC 1.2*  --   --   CREATININE 0.97  --   --   LATICACIDVEN  --  1.53 1.08    Estimated Creatinine Clearance: 87.3 mL/min (by C-G formula based on Cr of 0.97).    No Known Allergies   Thank you for allowing pharmacy to be a part of this patient's care.  Wynona Neat, PharmD, BCPS  01/09/2016 4:56 AM

## 2016-01-09 NOTE — Progress Notes (Addendum)
Patient admitted after midnight, please see H&P.  Await cultures-- did have +sick contacts in grandson Physician at [phone]-- await bed for transfer  [place]

## 2016-01-10 LAB — URINE CULTURE: CULTURE: NO GROWTH

## 2016-01-13 ENCOUNTER — Encounter: Payer: Self-pay | Admitting: Family Medicine

## 2016-01-14 LAB — CULTURE, BLOOD (ROUTINE X 2)
Culture: NO GROWTH
Culture: NO GROWTH

## 2016-01-16 DIAGNOSIS — D469 Myelodysplastic syndrome, unspecified: Secondary | ICD-10-CM | POA: Diagnosis not present

## 2016-01-23 DIAGNOSIS — D469 Myelodysplastic syndrome, unspecified: Secondary | ICD-10-CM | POA: Diagnosis not present

## 2016-01-23 DIAGNOSIS — Z79899 Other long term (current) drug therapy: Secondary | ICD-10-CM | POA: Diagnosis not present

## 2016-01-23 DIAGNOSIS — D46Z Other myelodysplastic syndromes: Secondary | ICD-10-CM | POA: Diagnosis not present

## 2016-01-23 DIAGNOSIS — Z5111 Encounter for antineoplastic chemotherapy: Secondary | ICD-10-CM | POA: Diagnosis not present

## 2016-01-24 DIAGNOSIS — Z79899 Other long term (current) drug therapy: Secondary | ICD-10-CM | POA: Diagnosis not present

## 2016-01-24 DIAGNOSIS — D469 Myelodysplastic syndrome, unspecified: Secondary | ICD-10-CM | POA: Diagnosis not present

## 2016-01-24 DIAGNOSIS — Z5111 Encounter for antineoplastic chemotherapy: Secondary | ICD-10-CM | POA: Diagnosis not present

## 2016-01-25 DIAGNOSIS — Z5111 Encounter for antineoplastic chemotherapy: Secondary | ICD-10-CM | POA: Diagnosis not present

## 2016-01-25 DIAGNOSIS — D469 Myelodysplastic syndrome, unspecified: Secondary | ICD-10-CM | POA: Diagnosis not present

## 2016-01-26 DIAGNOSIS — D469 Myelodysplastic syndrome, unspecified: Secondary | ICD-10-CM | POA: Diagnosis not present

## 2016-01-26 DIAGNOSIS — Z79899 Other long term (current) drug therapy: Secondary | ICD-10-CM | POA: Diagnosis not present

## 2016-01-27 ENCOUNTER — Other Ambulatory Visit: Payer: Self-pay | Admitting: Family Medicine

## 2016-01-27 DIAGNOSIS — D469 Myelodysplastic syndrome, unspecified: Secondary | ICD-10-CM | POA: Diagnosis not present

## 2016-01-27 DIAGNOSIS — Z79899 Other long term (current) drug therapy: Secondary | ICD-10-CM | POA: Diagnosis not present

## 2016-01-27 DIAGNOSIS — Z5111 Encounter for antineoplastic chemotherapy: Secondary | ICD-10-CM | POA: Diagnosis not present

## 2016-02-06 DIAGNOSIS — Z79899 Other long term (current) drug therapy: Secondary | ICD-10-CM | POA: Diagnosis not present

## 2016-02-06 DIAGNOSIS — D4621 Refractory anemia with excess of blasts 1: Secondary | ICD-10-CM | POA: Diagnosis not present

## 2016-02-06 DIAGNOSIS — D469 Myelodysplastic syndrome, unspecified: Secondary | ICD-10-CM | POA: Diagnosis not present

## 2016-02-07 DIAGNOSIS — D469 Myelodysplastic syndrome, unspecified: Secondary | ICD-10-CM | POA: Diagnosis not present

## 2016-02-20 DIAGNOSIS — D469 Myelodysplastic syndrome, unspecified: Secondary | ICD-10-CM | POA: Diagnosis not present

## 2016-02-21 DIAGNOSIS — Z5111 Encounter for antineoplastic chemotherapy: Secondary | ICD-10-CM | POA: Diagnosis not present

## 2016-02-21 DIAGNOSIS — D469 Myelodysplastic syndrome, unspecified: Secondary | ICD-10-CM | POA: Diagnosis not present

## 2016-02-21 DIAGNOSIS — Z79899 Other long term (current) drug therapy: Secondary | ICD-10-CM | POA: Diagnosis not present

## 2016-02-22 DIAGNOSIS — D469 Myelodysplastic syndrome, unspecified: Secondary | ICD-10-CM | POA: Diagnosis not present

## 2016-02-23 DIAGNOSIS — Z5111 Encounter for antineoplastic chemotherapy: Secondary | ICD-10-CM | POA: Diagnosis not present

## 2016-02-23 DIAGNOSIS — D469 Myelodysplastic syndrome, unspecified: Secondary | ICD-10-CM | POA: Diagnosis not present

## 2016-02-24 ENCOUNTER — Other Ambulatory Visit: Payer: Self-pay

## 2016-02-24 DIAGNOSIS — D469 Myelodysplastic syndrome, unspecified: Secondary | ICD-10-CM | POA: Diagnosis not present

## 2016-02-24 DIAGNOSIS — Z5111 Encounter for antineoplastic chemotherapy: Secondary | ICD-10-CM | POA: Diagnosis not present

## 2016-02-29 DIAGNOSIS — D46Z Other myelodysplastic syndromes: Secondary | ICD-10-CM | POA: Diagnosis not present

## 2016-02-29 DIAGNOSIS — C768 Malignant neoplasm of other specified ill-defined sites: Secondary | ICD-10-CM | POA: Diagnosis not present

## 2016-02-29 DIAGNOSIS — Z0189 Encounter for other specified special examinations: Secondary | ICD-10-CM | POA: Diagnosis not present

## 2016-02-29 DIAGNOSIS — D469 Myelodysplastic syndrome, unspecified: Secondary | ICD-10-CM | POA: Diagnosis not present

## 2016-02-29 DIAGNOSIS — Z01818 Encounter for other preprocedural examination: Secondary | ICD-10-CM | POA: Diagnosis not present

## 2016-02-29 DIAGNOSIS — Z79899 Other long term (current) drug therapy: Secondary | ICD-10-CM | POA: Diagnosis not present

## 2016-02-29 DIAGNOSIS — M47814 Spondylosis without myelopathy or radiculopathy, thoracic region: Secondary | ICD-10-CM | POA: Diagnosis not present

## 2016-02-29 DIAGNOSIS — R682 Dry mouth, unspecified: Secondary | ICD-10-CM | POA: Diagnosis not present

## 2016-02-29 DIAGNOSIS — I517 Cardiomegaly: Secondary | ICD-10-CM | POA: Diagnosis not present

## 2016-02-29 DIAGNOSIS — Z5181 Encounter for therapeutic drug level monitoring: Secondary | ICD-10-CM | POA: Diagnosis not present

## 2016-02-29 DIAGNOSIS — D709 Neutropenia, unspecified: Secondary | ICD-10-CM | POA: Diagnosis not present

## 2016-02-29 DIAGNOSIS — I10 Essential (primary) hypertension: Secondary | ICD-10-CM | POA: Diagnosis not present

## 2016-02-29 DIAGNOSIS — E7800 Pure hypercholesterolemia, unspecified: Secondary | ICD-10-CM | POA: Diagnosis not present

## 2016-02-29 DIAGNOSIS — Z113 Encounter for screening for infections with a predominantly sexual mode of transmission: Secondary | ICD-10-CM | POA: Diagnosis not present

## 2016-02-29 DIAGNOSIS — Z1159 Encounter for screening for other viral diseases: Secondary | ICD-10-CM | POA: Diagnosis not present

## 2016-02-29 DIAGNOSIS — Z114 Encounter for screening for human immunodeficiency virus [HIV]: Secondary | ICD-10-CM | POA: Diagnosis not present

## 2016-02-29 DIAGNOSIS — R001 Bradycardia, unspecified: Secondary | ICD-10-CM | POA: Diagnosis not present

## 2016-03-05 DIAGNOSIS — D469 Myelodysplastic syndrome, unspecified: Secondary | ICD-10-CM | POA: Diagnosis not present

## 2016-03-05 DIAGNOSIS — D46Z Other myelodysplastic syndromes: Secondary | ICD-10-CM | POA: Diagnosis not present

## 2016-03-05 DIAGNOSIS — Z79899 Other long term (current) drug therapy: Secondary | ICD-10-CM | POA: Diagnosis not present

## 2016-03-14 DIAGNOSIS — D469 Myelodysplastic syndrome, unspecified: Secondary | ICD-10-CM | POA: Diagnosis not present

## 2016-03-14 DIAGNOSIS — Z114 Encounter for screening for human immunodeficiency virus [HIV]: Secondary | ICD-10-CM | POA: Diagnosis not present

## 2016-03-14 DIAGNOSIS — Z113 Encounter for screening for infections with a predominantly sexual mode of transmission: Secondary | ICD-10-CM | POA: Diagnosis not present

## 2016-03-14 DIAGNOSIS — Z01818 Encounter for other preprocedural examination: Secondary | ICD-10-CM | POA: Diagnosis not present

## 2016-03-14 DIAGNOSIS — Z1159 Encounter for screening for other viral diseases: Secondary | ICD-10-CM | POA: Diagnosis not present

## 2016-03-14 LAB — HEPATIC FUNCTION PANEL
ALK PHOS: 49 U/L (ref 25–125)
ALT: 21 U/L (ref 10–40)
AST: 18 U/L (ref 14–40)
BILIRUBIN, TOTAL: 0.7 mg/dL

## 2016-03-14 LAB — CBC AND DIFFERENTIAL
HEMATOCRIT: 36 % — AB (ref 41–53)
HEMOGLOBIN: 12.3 g/dL — AB (ref 13.5–17.5)
Platelets: 252 10*3/uL (ref 150–399)
WBC: 1.4 10^3/mL

## 2016-03-14 LAB — BASIC METABOLIC PANEL
BUN: 14 mg/dL (ref 4–21)
Creatinine: 0.8 mg/dL (ref <=1.3)
Glucose: 107 mg/dL
Potassium: 4.3 mmol/L (ref 3.4–5.3)
SODIUM: 137 mmol/L (ref 137–147)

## 2016-03-19 ENCOUNTER — Encounter: Payer: Self-pay | Admitting: Family Medicine

## 2016-03-20 DIAGNOSIS — D469 Myelodysplastic syndrome, unspecified: Secondary | ICD-10-CM | POA: Diagnosis not present

## 2016-03-28 DIAGNOSIS — D469 Myelodysplastic syndrome, unspecified: Secondary | ICD-10-CM | POA: Diagnosis not present

## 2016-03-28 DIAGNOSIS — Z01818 Encounter for other preprocedural examination: Secondary | ICD-10-CM | POA: Diagnosis not present

## 2016-03-28 DIAGNOSIS — D46Z Other myelodysplastic syndromes: Secondary | ICD-10-CM | POA: Diagnosis not present

## 2016-04-04 DIAGNOSIS — Z452 Encounter for adjustment and management of vascular access device: Secondary | ICD-10-CM | POA: Diagnosis not present

## 2016-04-04 DIAGNOSIS — Z01818 Encounter for other preprocedural examination: Secondary | ICD-10-CM | POA: Diagnosis not present

## 2016-04-04 DIAGNOSIS — D469 Myelodysplastic syndrome, unspecified: Secondary | ICD-10-CM | POA: Diagnosis not present

## 2016-04-04 LAB — BASIC METABOLIC PANEL
BUN: 12 mg/dL (ref 4–21)
Potassium: 4.4 mmol/L (ref 3.4–5.3)
SODIUM: 139 mmol/L (ref 137–147)

## 2016-04-04 LAB — CBC AND DIFFERENTIAL
HCT: 40 % — AB (ref 41–53)
HEMOGLOBIN: 13.6 g/dL (ref 13.5–17.5)
Platelets: 280 10*3/uL (ref 150–399)
WBC: 3.5 10^3/mL

## 2016-04-08 DIAGNOSIS — I10 Essential (primary) hypertension: Secondary | ICD-10-CM | POA: Diagnosis not present

## 2016-04-08 DIAGNOSIS — D709 Neutropenia, unspecified: Secondary | ICD-10-CM | POA: Diagnosis not present

## 2016-04-08 DIAGNOSIS — R5081 Fever presenting with conditions classified elsewhere: Secondary | ICD-10-CM | POA: Diagnosis not present

## 2016-04-08 DIAGNOSIS — D469 Myelodysplastic syndrome, unspecified: Secondary | ICD-10-CM | POA: Diagnosis not present

## 2016-04-08 DIAGNOSIS — M7989 Other specified soft tissue disorders: Secondary | ICD-10-CM | POA: Diagnosis not present

## 2016-04-09 DIAGNOSIS — E7800 Pure hypercholesterolemia, unspecified: Secondary | ICD-10-CM | POA: Diagnosis present

## 2016-04-09 DIAGNOSIS — K317 Polyp of stomach and duodenum: Secondary | ICD-10-CM | POA: Diagnosis not present

## 2016-04-09 DIAGNOSIS — R002 Palpitations: Secondary | ICD-10-CM | POA: Diagnosis present

## 2016-04-09 DIAGNOSIS — Z01818 Encounter for other preprocedural examination: Secondary | ICD-10-CM | POA: Diagnosis not present

## 2016-04-09 DIAGNOSIS — R609 Edema, unspecified: Secondary | ICD-10-CM | POA: Diagnosis not present

## 2016-04-09 DIAGNOSIS — R918 Other nonspecific abnormal finding of lung field: Secondary | ICD-10-CM | POA: Diagnosis not present

## 2016-04-09 DIAGNOSIS — I517 Cardiomegaly: Secondary | ICD-10-CM | POA: Diagnosis not present

## 2016-04-09 DIAGNOSIS — D709 Neutropenia, unspecified: Secondary | ICD-10-CM | POA: Diagnosis not present

## 2016-04-09 DIAGNOSIS — K219 Gastro-esophageal reflux disease without esophagitis: Secondary | ICD-10-CM | POA: Diagnosis present

## 2016-04-09 DIAGNOSIS — R110 Nausea: Secondary | ICD-10-CM | POA: Diagnosis not present

## 2016-04-09 DIAGNOSIS — G43A0 Cyclical vomiting, not intractable: Secondary | ICD-10-CM | POA: Diagnosis not present

## 2016-04-09 DIAGNOSIS — D89810 Acute graft-versus-host disease: Secondary | ICD-10-CM | POA: Diagnosis not present

## 2016-04-09 DIAGNOSIS — Z9481 Bone marrow transplant status: Secondary | ICD-10-CM | POA: Diagnosis not present

## 2016-04-09 DIAGNOSIS — Z452 Encounter for adjustment and management of vascular access device: Secondary | ICD-10-CM | POA: Diagnosis not present

## 2016-04-09 DIAGNOSIS — R5081 Fever presenting with conditions classified elsewhere: Secondary | ICD-10-CM | POA: Diagnosis not present

## 2016-04-09 DIAGNOSIS — J9811 Atelectasis: Secondary | ICD-10-CM | POA: Diagnosis not present

## 2016-04-09 DIAGNOSIS — I10 Essential (primary) hypertension: Secondary | ICD-10-CM | POA: Diagnosis not present

## 2016-04-09 DIAGNOSIS — E871 Hypo-osmolality and hyponatremia: Secondary | ICD-10-CM | POA: Diagnosis not present

## 2016-04-09 DIAGNOSIS — R0981 Nasal congestion: Secondary | ICD-10-CM | POA: Diagnosis not present

## 2016-04-09 DIAGNOSIS — T865 Complications of stem cell transplant: Secondary | ICD-10-CM | POA: Diagnosis not present

## 2016-04-09 DIAGNOSIS — D89813 Graft-versus-host disease, unspecified: Secondary | ICD-10-CM | POA: Diagnosis not present

## 2016-04-09 DIAGNOSIS — M7989 Other specified soft tissue disorders: Secondary | ICD-10-CM | POA: Diagnosis not present

## 2016-04-09 DIAGNOSIS — T451X5A Adverse effect of antineoplastic and immunosuppressive drugs, initial encounter: Secondary | ICD-10-CM | POA: Diagnosis present

## 2016-04-09 DIAGNOSIS — D61810 Antineoplastic chemotherapy induced pancytopenia: Secondary | ICD-10-CM | POA: Diagnosis present

## 2016-04-09 DIAGNOSIS — K5900 Constipation, unspecified: Secondary | ICD-10-CM | POA: Diagnosis present

## 2016-04-09 DIAGNOSIS — R300 Dysuria: Secondary | ICD-10-CM | POA: Diagnosis not present

## 2016-04-09 DIAGNOSIS — K228 Other specified diseases of esophagus: Secondary | ICD-10-CM | POA: Diagnosis not present

## 2016-04-09 DIAGNOSIS — E8770 Fluid overload, unspecified: Secondary | ICD-10-CM | POA: Diagnosis not present

## 2016-04-09 DIAGNOSIS — D469 Myelodysplastic syndrome, unspecified: Secondary | ICD-10-CM | POA: Diagnosis not present

## 2016-04-09 DIAGNOSIS — M1990 Unspecified osteoarthritis, unspecified site: Secondary | ICD-10-CM | POA: Diagnosis present

## 2016-04-09 DIAGNOSIS — K2970 Gastritis, unspecified, without bleeding: Secondary | ICD-10-CM | POA: Diagnosis not present

## 2016-04-09 DIAGNOSIS — J810 Acute pulmonary edema: Secondary | ICD-10-CM | POA: Diagnosis not present

## 2016-04-09 DIAGNOSIS — R197 Diarrhea, unspecified: Secondary | ICD-10-CM | POA: Diagnosis not present

## 2016-04-09 DIAGNOSIS — K2980 Duodenitis without bleeding: Secondary | ICD-10-CM | POA: Diagnosis not present

## 2016-04-09 DIAGNOSIS — J90 Pleural effusion, not elsewhere classified: Secondary | ICD-10-CM | POA: Diagnosis not present

## 2016-04-19 ENCOUNTER — Encounter: Payer: Self-pay | Admitting: Family Medicine

## 2016-05-11 DIAGNOSIS — Z9481 Bone marrow transplant status: Secondary | ICD-10-CM | POA: Diagnosis not present

## 2016-05-11 DIAGNOSIS — D469 Myelodysplastic syndrome, unspecified: Secondary | ICD-10-CM | POA: Diagnosis not present

## 2016-05-12 DIAGNOSIS — D469 Myelodysplastic syndrome, unspecified: Secondary | ICD-10-CM | POA: Diagnosis not present

## 2016-05-13 DIAGNOSIS — D469 Myelodysplastic syndrome, unspecified: Secondary | ICD-10-CM | POA: Diagnosis not present

## 2016-05-14 DIAGNOSIS — D469 Myelodysplastic syndrome, unspecified: Secondary | ICD-10-CM | POA: Diagnosis not present

## 2016-05-14 DIAGNOSIS — I10 Essential (primary) hypertension: Secondary | ICD-10-CM | POA: Diagnosis not present

## 2016-05-14 DIAGNOSIS — Z9484 Stem cells transplant status: Secondary | ICD-10-CM | POA: Diagnosis not present

## 2016-05-14 DIAGNOSIS — K219 Gastro-esophageal reflux disease without esophagitis: Secondary | ICD-10-CM | POA: Diagnosis not present

## 2016-05-14 DIAGNOSIS — M1990 Unspecified osteoarthritis, unspecified site: Secondary | ICD-10-CM | POA: Diagnosis not present

## 2016-05-14 DIAGNOSIS — Z713 Dietary counseling and surveillance: Secondary | ICD-10-CM | POA: Diagnosis not present

## 2016-05-14 DIAGNOSIS — Z9481 Bone marrow transplant status: Secondary | ICD-10-CM | POA: Diagnosis not present

## 2016-05-14 DIAGNOSIS — D709 Neutropenia, unspecified: Secondary | ICD-10-CM | POA: Diagnosis not present

## 2016-05-14 DIAGNOSIS — R5383 Other fatigue: Secondary | ICD-10-CM | POA: Diagnosis not present

## 2016-05-14 DIAGNOSIS — Z48298 Encounter for aftercare following other organ transplant: Secondary | ICD-10-CM | POA: Diagnosis not present

## 2016-05-15 DIAGNOSIS — D469 Myelodysplastic syndrome, unspecified: Secondary | ICD-10-CM | POA: Diagnosis not present

## 2016-05-16 DIAGNOSIS — R600 Localized edema: Secondary | ICD-10-CM | POA: Diagnosis not present

## 2016-05-16 DIAGNOSIS — Z9481 Bone marrow transplant status: Secondary | ICD-10-CM | POA: Diagnosis not present

## 2016-05-16 DIAGNOSIS — E871 Hypo-osmolality and hyponatremia: Secondary | ICD-10-CM | POA: Diagnosis not present

## 2016-05-16 DIAGNOSIS — R110 Nausea: Secondary | ICD-10-CM | POA: Diagnosis not present

## 2016-05-16 DIAGNOSIS — J3489 Other specified disorders of nose and nasal sinuses: Secondary | ICD-10-CM | POA: Diagnosis not present

## 2016-05-16 DIAGNOSIS — R5383 Other fatigue: Secondary | ICD-10-CM | POA: Diagnosis not present

## 2016-05-16 DIAGNOSIS — Z9484 Stem cells transplant status: Secondary | ICD-10-CM | POA: Diagnosis not present

## 2016-05-16 DIAGNOSIS — F419 Anxiety disorder, unspecified: Secondary | ICD-10-CM | POA: Diagnosis not present

## 2016-05-16 DIAGNOSIS — M79662 Pain in left lower leg: Secondary | ICD-10-CM | POA: Diagnosis not present

## 2016-05-16 DIAGNOSIS — J810 Acute pulmonary edema: Secondary | ICD-10-CM | POA: Diagnosis not present

## 2016-05-16 DIAGNOSIS — I10 Essential (primary) hypertension: Secondary | ICD-10-CM | POA: Diagnosis not present

## 2016-05-16 DIAGNOSIS — Z79899 Other long term (current) drug therapy: Secondary | ICD-10-CM | POA: Diagnosis not present

## 2016-05-16 DIAGNOSIS — R002 Palpitations: Secondary | ICD-10-CM | POA: Diagnosis not present

## 2016-05-16 DIAGNOSIS — D469 Myelodysplastic syndrome, unspecified: Secondary | ICD-10-CM | POA: Diagnosis not present

## 2016-05-16 DIAGNOSIS — D709 Neutropenia, unspecified: Secondary | ICD-10-CM | POA: Diagnosis not present

## 2016-05-16 DIAGNOSIS — K219 Gastro-esophageal reflux disease without esophagitis: Secondary | ICD-10-CM | POA: Diagnosis not present

## 2016-05-16 DIAGNOSIS — R0981 Nasal congestion: Secondary | ICD-10-CM | POA: Diagnosis not present

## 2016-05-18 DIAGNOSIS — Z9481 Bone marrow transplant status: Secondary | ICD-10-CM | POA: Diagnosis not present

## 2016-05-18 DIAGNOSIS — D469 Myelodysplastic syndrome, unspecified: Secondary | ICD-10-CM | POA: Diagnosis not present

## 2016-05-19 DIAGNOSIS — D469 Myelodysplastic syndrome, unspecified: Secondary | ICD-10-CM | POA: Diagnosis not present

## 2016-05-20 DIAGNOSIS — D469 Myelodysplastic syndrome, unspecified: Secondary | ICD-10-CM | POA: Diagnosis not present

## 2016-05-21 DIAGNOSIS — J811 Chronic pulmonary edema: Secondary | ICD-10-CM | POA: Diagnosis not present

## 2016-05-21 DIAGNOSIS — I10 Essential (primary) hypertension: Secondary | ICD-10-CM | POA: Diagnosis not present

## 2016-05-21 DIAGNOSIS — D469 Myelodysplastic syndrome, unspecified: Secondary | ICD-10-CM | POA: Diagnosis not present

## 2016-05-21 DIAGNOSIS — M79662 Pain in left lower leg: Secondary | ICD-10-CM | POA: Diagnosis not present

## 2016-05-21 DIAGNOSIS — E871 Hypo-osmolality and hyponatremia: Secondary | ICD-10-CM | POA: Diagnosis not present

## 2016-05-21 DIAGNOSIS — Z9484 Stem cells transplant status: Secondary | ICD-10-CM | POA: Diagnosis not present

## 2016-05-21 DIAGNOSIS — R110 Nausea: Secondary | ICD-10-CM | POA: Diagnosis not present

## 2016-05-21 DIAGNOSIS — R600 Localized edema: Secondary | ICD-10-CM | POA: Diagnosis not present

## 2016-05-21 DIAGNOSIS — K210 Gastro-esophageal reflux disease with esophagitis: Secondary | ICD-10-CM | POA: Diagnosis not present

## 2016-05-21 DIAGNOSIS — Z79899 Other long term (current) drug therapy: Secondary | ICD-10-CM | POA: Diagnosis not present

## 2016-05-21 DIAGNOSIS — R0981 Nasal congestion: Secondary | ICD-10-CM | POA: Diagnosis not present

## 2016-05-23 DIAGNOSIS — R918 Other nonspecific abnormal finding of lung field: Secondary | ICD-10-CM | POA: Diagnosis not present

## 2016-05-23 DIAGNOSIS — D469 Myelodysplastic syndrome, unspecified: Secondary | ICD-10-CM | POA: Diagnosis not present

## 2016-05-23 DIAGNOSIS — J90 Pleural effusion, not elsewhere classified: Secondary | ICD-10-CM | POA: Diagnosis not present

## 2016-05-23 DIAGNOSIS — Z452 Encounter for adjustment and management of vascular access device: Secondary | ICD-10-CM | POA: Diagnosis not present

## 2016-05-25 DIAGNOSIS — D469 Myelodysplastic syndrome, unspecified: Secondary | ICD-10-CM | POA: Diagnosis not present

## 2016-05-26 DIAGNOSIS — D469 Myelodysplastic syndrome, unspecified: Secondary | ICD-10-CM | POA: Diagnosis not present

## 2016-05-28 DIAGNOSIS — Z9484 Stem cells transplant status: Secondary | ICD-10-CM | POA: Diagnosis not present

## 2016-05-28 DIAGNOSIS — Z79899 Other long term (current) drug therapy: Secondary | ICD-10-CM | POA: Diagnosis not present

## 2016-05-28 DIAGNOSIS — T451X5D Adverse effect of antineoplastic and immunosuppressive drugs, subsequent encounter: Secondary | ICD-10-CM | POA: Diagnosis not present

## 2016-05-28 DIAGNOSIS — K5900 Constipation, unspecified: Secondary | ICD-10-CM | POA: Diagnosis not present

## 2016-05-28 DIAGNOSIS — R110 Nausea: Secondary | ICD-10-CM | POA: Diagnosis not present

## 2016-05-28 DIAGNOSIS — J90 Pleural effusion, not elsewhere classified: Secondary | ICD-10-CM | POA: Diagnosis not present

## 2016-05-28 DIAGNOSIS — I10 Essential (primary) hypertension: Secondary | ICD-10-CM | POA: Diagnosis not present

## 2016-05-28 DIAGNOSIS — R0981 Nasal congestion: Secondary | ICD-10-CM | POA: Diagnosis not present

## 2016-05-28 DIAGNOSIS — R002 Palpitations: Secondary | ICD-10-CM | POA: Diagnosis not present

## 2016-05-28 DIAGNOSIS — E876 Hypokalemia: Secondary | ICD-10-CM | POA: Diagnosis not present

## 2016-05-28 DIAGNOSIS — R918 Other nonspecific abnormal finding of lung field: Secondary | ICD-10-CM | POA: Diagnosis not present

## 2016-05-28 DIAGNOSIS — R05 Cough: Secondary | ICD-10-CM | POA: Diagnosis not present

## 2016-05-28 DIAGNOSIS — J811 Chronic pulmonary edema: Secondary | ICD-10-CM | POA: Diagnosis not present

## 2016-05-28 DIAGNOSIS — D469 Myelodysplastic syndrome, unspecified: Secondary | ICD-10-CM | POA: Diagnosis not present

## 2016-05-28 DIAGNOSIS — J3489 Other specified disorders of nose and nasal sinuses: Secondary | ICD-10-CM | POA: Diagnosis not present

## 2016-05-28 DIAGNOSIS — E871 Hypo-osmolality and hyponatremia: Secondary | ICD-10-CM | POA: Diagnosis not present

## 2016-05-30 DIAGNOSIS — C9200 Acute myeloblastic leukemia, not having achieved remission: Secondary | ICD-10-CM | POA: Diagnosis not present

## 2016-05-30 DIAGNOSIS — Z9484 Stem cells transplant status: Secondary | ICD-10-CM | POA: Diagnosis not present

## 2016-05-30 DIAGNOSIS — K210 Gastro-esophageal reflux disease with esophagitis: Secondary | ICD-10-CM | POA: Diagnosis not present

## 2016-05-30 DIAGNOSIS — R110 Nausea: Secondary | ICD-10-CM | POA: Diagnosis not present

## 2016-05-30 DIAGNOSIS — Z5181 Encounter for therapeutic drug level monitoring: Secondary | ICD-10-CM | POA: Diagnosis not present

## 2016-05-30 DIAGNOSIS — R0981 Nasal congestion: Secondary | ICD-10-CM | POA: Diagnosis not present

## 2016-05-30 DIAGNOSIS — I10 Essential (primary) hypertension: Secondary | ICD-10-CM | POA: Diagnosis not present

## 2016-05-30 DIAGNOSIS — D469 Myelodysplastic syndrome, unspecified: Secondary | ICD-10-CM | POA: Diagnosis not present

## 2016-05-30 DIAGNOSIS — E871 Hypo-osmolality and hyponatremia: Secondary | ICD-10-CM | POA: Diagnosis not present

## 2016-05-31 DIAGNOSIS — Z9484 Stem cells transplant status: Secondary | ICD-10-CM | POA: Diagnosis not present

## 2016-05-31 DIAGNOSIS — B259 Cytomegaloviral disease, unspecified: Secondary | ICD-10-CM | POA: Diagnosis not present

## 2016-05-31 DIAGNOSIS — J90 Pleural effusion, not elsewhere classified: Secondary | ICD-10-CM | POA: Diagnosis not present

## 2016-05-31 DIAGNOSIS — D469 Myelodysplastic syndrome, unspecified: Secondary | ICD-10-CM | POA: Diagnosis not present

## 2016-06-01 DIAGNOSIS — B259 Cytomegaloviral disease, unspecified: Secondary | ICD-10-CM | POA: Diagnosis not present

## 2016-06-01 DIAGNOSIS — Z5181 Encounter for therapeutic drug level monitoring: Secondary | ICD-10-CM | POA: Diagnosis not present

## 2016-06-01 DIAGNOSIS — Z9484 Stem cells transplant status: Secondary | ICD-10-CM | POA: Diagnosis not present

## 2016-06-01 DIAGNOSIS — R5381 Other malaise: Secondary | ICD-10-CM | POA: Diagnosis not present

## 2016-06-01 DIAGNOSIS — D469 Myelodysplastic syndrome, unspecified: Secondary | ICD-10-CM | POA: Diagnosis not present

## 2016-06-01 DIAGNOSIS — R5383 Other fatigue: Secondary | ICD-10-CM | POA: Diagnosis not present

## 2016-06-02 DIAGNOSIS — Z5181 Encounter for therapeutic drug level monitoring: Secondary | ICD-10-CM | POA: Diagnosis not present

## 2016-06-02 DIAGNOSIS — Z9484 Stem cells transplant status: Secondary | ICD-10-CM | POA: Diagnosis not present

## 2016-06-02 DIAGNOSIS — D469 Myelodysplastic syndrome, unspecified: Secondary | ICD-10-CM | POA: Diagnosis not present

## 2016-06-02 DIAGNOSIS — B259 Cytomegaloviral disease, unspecified: Secondary | ICD-10-CM | POA: Diagnosis not present

## 2016-06-02 DIAGNOSIS — R5383 Other fatigue: Secondary | ICD-10-CM | POA: Diagnosis not present

## 2016-06-02 DIAGNOSIS — R5381 Other malaise: Secondary | ICD-10-CM | POA: Diagnosis not present

## 2016-06-03 DIAGNOSIS — B259 Cytomegaloviral disease, unspecified: Secondary | ICD-10-CM | POA: Diagnosis not present

## 2016-06-04 DIAGNOSIS — J810 Acute pulmonary edema: Secondary | ICD-10-CM | POA: Diagnosis not present

## 2016-06-04 DIAGNOSIS — I10 Essential (primary) hypertension: Secondary | ICD-10-CM | POA: Diagnosis not present

## 2016-06-04 DIAGNOSIS — J3489 Other specified disorders of nose and nasal sinuses: Secondary | ICD-10-CM | POA: Diagnosis not present

## 2016-06-04 DIAGNOSIS — R5383 Other fatigue: Secondary | ICD-10-CM | POA: Diagnosis not present

## 2016-06-04 DIAGNOSIS — R5381 Other malaise: Secondary | ICD-10-CM | POA: Diagnosis not present

## 2016-06-04 DIAGNOSIS — B259 Cytomegaloviral disease, unspecified: Secondary | ICD-10-CM | POA: Diagnosis not present

## 2016-06-04 DIAGNOSIS — R05 Cough: Secondary | ICD-10-CM | POA: Diagnosis not present

## 2016-06-04 DIAGNOSIS — J90 Pleural effusion, not elsewhere classified: Secondary | ICD-10-CM | POA: Diagnosis not present

## 2016-06-04 DIAGNOSIS — Z48298 Encounter for aftercare following other organ transplant: Secondary | ICD-10-CM | POA: Diagnosis not present

## 2016-06-04 DIAGNOSIS — Z9484 Stem cells transplant status: Secondary | ICD-10-CM | POA: Diagnosis not present

## 2016-06-04 DIAGNOSIS — E871 Hypo-osmolality and hyponatremia: Secondary | ICD-10-CM | POA: Diagnosis not present

## 2016-06-04 DIAGNOSIS — K210 Gastro-esophageal reflux disease with esophagitis: Secondary | ICD-10-CM | POA: Diagnosis not present

## 2016-06-04 DIAGNOSIS — D469 Myelodysplastic syndrome, unspecified: Secondary | ICD-10-CM | POA: Diagnosis not present

## 2016-06-04 DIAGNOSIS — Z5181 Encounter for therapeutic drug level monitoring: Secondary | ICD-10-CM | POA: Diagnosis not present

## 2016-06-06 DIAGNOSIS — Z452 Encounter for adjustment and management of vascular access device: Secondary | ICD-10-CM | POA: Diagnosis not present

## 2016-06-06 DIAGNOSIS — R0981 Nasal congestion: Secondary | ICD-10-CM | POA: Diagnosis not present

## 2016-06-06 DIAGNOSIS — Z9484 Stem cells transplant status: Secondary | ICD-10-CM | POA: Diagnosis not present

## 2016-06-06 DIAGNOSIS — I10 Essential (primary) hypertension: Secondary | ICD-10-CM | POA: Diagnosis not present

## 2016-06-06 DIAGNOSIS — J811 Chronic pulmonary edema: Secondary | ICD-10-CM | POA: Diagnosis not present

## 2016-06-06 DIAGNOSIS — R5383 Other fatigue: Secondary | ICD-10-CM | POA: Diagnosis not present

## 2016-06-06 DIAGNOSIS — K219 Gastro-esophageal reflux disease without esophagitis: Secondary | ICD-10-CM | POA: Diagnosis not present

## 2016-06-06 DIAGNOSIS — R002 Palpitations: Secondary | ICD-10-CM | POA: Diagnosis not present

## 2016-06-06 DIAGNOSIS — R110 Nausea: Secondary | ICD-10-CM | POA: Diagnosis not present

## 2016-06-06 DIAGNOSIS — B259 Cytomegaloviral disease, unspecified: Secondary | ICD-10-CM | POA: Diagnosis not present

## 2016-06-06 DIAGNOSIS — Z5181 Encounter for therapeutic drug level monitoring: Secondary | ICD-10-CM | POA: Diagnosis not present

## 2016-06-06 DIAGNOSIS — Z79899 Other long term (current) drug therapy: Secondary | ICD-10-CM | POA: Diagnosis not present

## 2016-06-06 DIAGNOSIS — D469 Myelodysplastic syndrome, unspecified: Secondary | ICD-10-CM | POA: Diagnosis not present

## 2016-06-06 DIAGNOSIS — R5381 Other malaise: Secondary | ICD-10-CM | POA: Diagnosis not present

## 2016-06-08 DIAGNOSIS — Z9484 Stem cells transplant status: Secondary | ICD-10-CM | POA: Diagnosis not present

## 2016-06-08 DIAGNOSIS — D469 Myelodysplastic syndrome, unspecified: Secondary | ICD-10-CM | POA: Diagnosis not present

## 2016-06-08 DIAGNOSIS — B259 Cytomegaloviral disease, unspecified: Secondary | ICD-10-CM | POA: Diagnosis not present

## 2016-06-08 DIAGNOSIS — Z5181 Encounter for therapeutic drug level monitoring: Secondary | ICD-10-CM | POA: Diagnosis not present

## 2016-06-08 DIAGNOSIS — R5383 Other fatigue: Secondary | ICD-10-CM | POA: Diagnosis not present

## 2016-06-08 DIAGNOSIS — R5381 Other malaise: Secondary | ICD-10-CM | POA: Diagnosis not present

## 2016-06-09 DIAGNOSIS — Z5181 Encounter for therapeutic drug level monitoring: Secondary | ICD-10-CM | POA: Diagnosis not present

## 2016-06-09 DIAGNOSIS — B259 Cytomegaloviral disease, unspecified: Secondary | ICD-10-CM | POA: Diagnosis not present

## 2016-06-09 DIAGNOSIS — Z9484 Stem cells transplant status: Secondary | ICD-10-CM | POA: Diagnosis not present

## 2016-06-09 DIAGNOSIS — R5383 Other fatigue: Secondary | ICD-10-CM | POA: Diagnosis not present

## 2016-06-09 DIAGNOSIS — D469 Myelodysplastic syndrome, unspecified: Secondary | ICD-10-CM | POA: Diagnosis not present

## 2016-06-09 DIAGNOSIS — Z79899 Other long term (current) drug therapy: Secondary | ICD-10-CM | POA: Diagnosis not present

## 2016-06-09 DIAGNOSIS — R5381 Other malaise: Secondary | ICD-10-CM | POA: Diagnosis not present

## 2016-06-11 DIAGNOSIS — Z5181 Encounter for therapeutic drug level monitoring: Secondary | ICD-10-CM | POA: Diagnosis not present

## 2016-06-11 DIAGNOSIS — B259 Cytomegaloviral disease, unspecified: Secondary | ICD-10-CM | POA: Diagnosis not present

## 2016-06-11 DIAGNOSIS — R5381 Other malaise: Secondary | ICD-10-CM | POA: Diagnosis not present

## 2016-06-11 DIAGNOSIS — D469 Myelodysplastic syndrome, unspecified: Secondary | ICD-10-CM | POA: Diagnosis not present

## 2016-06-11 DIAGNOSIS — Z9484 Stem cells transplant status: Secondary | ICD-10-CM | POA: Diagnosis not present

## 2016-06-11 DIAGNOSIS — R5383 Other fatigue: Secondary | ICD-10-CM | POA: Diagnosis not present

## 2016-06-12 DIAGNOSIS — Z9484 Stem cells transplant status: Secondary | ICD-10-CM | POA: Diagnosis not present

## 2016-06-12 DIAGNOSIS — D469 Myelodysplastic syndrome, unspecified: Secondary | ICD-10-CM | POA: Diagnosis not present

## 2016-06-13 DIAGNOSIS — R5383 Other fatigue: Secondary | ICD-10-CM | POA: Diagnosis not present

## 2016-06-13 DIAGNOSIS — R5381 Other malaise: Secondary | ICD-10-CM | POA: Diagnosis not present

## 2016-06-13 DIAGNOSIS — Z9484 Stem cells transplant status: Secondary | ICD-10-CM | POA: Diagnosis not present

## 2016-06-13 DIAGNOSIS — D469 Myelodysplastic syndrome, unspecified: Secondary | ICD-10-CM | POA: Diagnosis not present

## 2016-06-13 DIAGNOSIS — B259 Cytomegaloviral disease, unspecified: Secondary | ICD-10-CM | POA: Diagnosis not present

## 2016-06-13 DIAGNOSIS — Z5181 Encounter for therapeutic drug level monitoring: Secondary | ICD-10-CM | POA: Diagnosis not present

## 2016-06-15 DIAGNOSIS — Z9484 Stem cells transplant status: Secondary | ICD-10-CM | POA: Diagnosis not present

## 2016-06-15 DIAGNOSIS — R5383 Other fatigue: Secondary | ICD-10-CM | POA: Diagnosis not present

## 2016-06-15 DIAGNOSIS — R5381 Other malaise: Secondary | ICD-10-CM | POA: Diagnosis not present

## 2016-06-15 DIAGNOSIS — Z5181 Encounter for therapeutic drug level monitoring: Secondary | ICD-10-CM | POA: Diagnosis not present

## 2016-06-15 DIAGNOSIS — D469 Myelodysplastic syndrome, unspecified: Secondary | ICD-10-CM | POA: Diagnosis not present

## 2016-06-15 DIAGNOSIS — B259 Cytomegaloviral disease, unspecified: Secondary | ICD-10-CM | POA: Diagnosis not present

## 2016-06-17 DIAGNOSIS — B259 Cytomegaloviral disease, unspecified: Secondary | ICD-10-CM | POA: Diagnosis not present

## 2016-06-17 DIAGNOSIS — R5381 Other malaise: Secondary | ICD-10-CM | POA: Diagnosis not present

## 2016-06-17 DIAGNOSIS — D469 Myelodysplastic syndrome, unspecified: Secondary | ICD-10-CM | POA: Diagnosis not present

## 2016-06-17 DIAGNOSIS — R5383 Other fatigue: Secondary | ICD-10-CM | POA: Diagnosis not present

## 2016-06-17 DIAGNOSIS — Z9484 Stem cells transplant status: Secondary | ICD-10-CM | POA: Diagnosis not present

## 2016-06-17 DIAGNOSIS — Z5181 Encounter for therapeutic drug level monitoring: Secondary | ICD-10-CM | POA: Diagnosis not present

## 2016-06-19 DIAGNOSIS — K210 Gastro-esophageal reflux disease with esophagitis: Secondary | ICD-10-CM | POA: Diagnosis not present

## 2016-06-19 DIAGNOSIS — T865 Complications of stem cell transplant: Secondary | ICD-10-CM | POA: Diagnosis not present

## 2016-06-19 DIAGNOSIS — Z79899 Other long term (current) drug therapy: Secondary | ICD-10-CM | POA: Diagnosis not present

## 2016-06-19 DIAGNOSIS — B259 Cytomegaloviral disease, unspecified: Secondary | ICD-10-CM | POA: Diagnosis not present

## 2016-06-19 DIAGNOSIS — R0981 Nasal congestion: Secondary | ICD-10-CM | POA: Diagnosis not present

## 2016-06-19 DIAGNOSIS — R5383 Other fatigue: Secondary | ICD-10-CM | POA: Diagnosis not present

## 2016-06-19 DIAGNOSIS — R5381 Other malaise: Secondary | ICD-10-CM | POA: Diagnosis not present

## 2016-06-19 DIAGNOSIS — Z5181 Encounter for therapeutic drug level monitoring: Secondary | ICD-10-CM | POA: Diagnosis not present

## 2016-06-19 DIAGNOSIS — R600 Localized edema: Secondary | ICD-10-CM | POA: Diagnosis not present

## 2016-06-19 DIAGNOSIS — D469 Myelodysplastic syndrome, unspecified: Secondary | ICD-10-CM | POA: Diagnosis not present

## 2016-06-19 DIAGNOSIS — I10 Essential (primary) hypertension: Secondary | ICD-10-CM | POA: Diagnosis not present

## 2016-06-19 DIAGNOSIS — Z9484 Stem cells transplant status: Secondary | ICD-10-CM | POA: Diagnosis not present

## 2016-06-19 DIAGNOSIS — R002 Palpitations: Secondary | ICD-10-CM | POA: Diagnosis not present

## 2016-06-21 DIAGNOSIS — B259 Cytomegaloviral disease, unspecified: Secondary | ICD-10-CM | POA: Diagnosis not present

## 2016-06-21 DIAGNOSIS — Z9484 Stem cells transplant status: Secondary | ICD-10-CM | POA: Diagnosis not present

## 2016-06-21 DIAGNOSIS — Z5181 Encounter for therapeutic drug level monitoring: Secondary | ICD-10-CM | POA: Diagnosis not present

## 2016-06-21 DIAGNOSIS — R5381 Other malaise: Secondary | ICD-10-CM | POA: Diagnosis not present

## 2016-06-21 DIAGNOSIS — D469 Myelodysplastic syndrome, unspecified: Secondary | ICD-10-CM | POA: Diagnosis not present

## 2016-06-21 DIAGNOSIS — R5383 Other fatigue: Secondary | ICD-10-CM | POA: Diagnosis not present

## 2016-06-24 DIAGNOSIS — B259 Cytomegaloviral disease, unspecified: Secondary | ICD-10-CM | POA: Diagnosis not present

## 2016-06-24 DIAGNOSIS — Z5181 Encounter for therapeutic drug level monitoring: Secondary | ICD-10-CM | POA: Diagnosis not present

## 2016-06-24 DIAGNOSIS — Z9484 Stem cells transplant status: Secondary | ICD-10-CM | POA: Diagnosis not present

## 2016-06-24 DIAGNOSIS — R5381 Other malaise: Secondary | ICD-10-CM | POA: Diagnosis not present

## 2016-06-24 DIAGNOSIS — D469 Myelodysplastic syndrome, unspecified: Secondary | ICD-10-CM | POA: Diagnosis not present

## 2016-06-24 DIAGNOSIS — R5383 Other fatigue: Secondary | ICD-10-CM | POA: Diagnosis not present

## 2016-06-26 DIAGNOSIS — K210 Gastro-esophageal reflux disease with esophagitis: Secondary | ICD-10-CM | POA: Diagnosis not present

## 2016-06-26 DIAGNOSIS — R600 Localized edema: Secondary | ICD-10-CM | POA: Diagnosis not present

## 2016-06-26 DIAGNOSIS — R001 Bradycardia, unspecified: Secondary | ICD-10-CM | POA: Diagnosis not present

## 2016-06-26 DIAGNOSIS — D469 Myelodysplastic syndrome, unspecified: Secondary | ICD-10-CM | POA: Diagnosis not present

## 2016-06-26 DIAGNOSIS — R5383 Other fatigue: Secondary | ICD-10-CM | POA: Diagnosis not present

## 2016-06-26 DIAGNOSIS — R5381 Other malaise: Secondary | ICD-10-CM | POA: Diagnosis not present

## 2016-06-26 DIAGNOSIS — Z9221 Personal history of antineoplastic chemotherapy: Secondary | ICD-10-CM | POA: Diagnosis not present

## 2016-06-26 DIAGNOSIS — I10 Essential (primary) hypertension: Secondary | ICD-10-CM | POA: Diagnosis not present

## 2016-06-26 DIAGNOSIS — R002 Palpitations: Secondary | ICD-10-CM | POA: Diagnosis not present

## 2016-06-26 DIAGNOSIS — Z5181 Encounter for therapeutic drug level monitoring: Secondary | ICD-10-CM | POA: Diagnosis not present

## 2016-06-26 DIAGNOSIS — B259 Cytomegaloviral disease, unspecified: Secondary | ICD-10-CM | POA: Diagnosis not present

## 2016-06-26 DIAGNOSIS — Z9484 Stem cells transplant status: Secondary | ICD-10-CM | POA: Diagnosis not present

## 2016-06-29 DIAGNOSIS — Z79899 Other long term (current) drug therapy: Secondary | ICD-10-CM | POA: Diagnosis not present

## 2016-06-29 DIAGNOSIS — K219 Gastro-esophageal reflux disease without esophagitis: Secondary | ICD-10-CM | POA: Diagnosis not present

## 2016-06-29 DIAGNOSIS — R5381 Other malaise: Secondary | ICD-10-CM | POA: Diagnosis not present

## 2016-06-29 DIAGNOSIS — I10 Essential (primary) hypertension: Secondary | ICD-10-CM | POA: Diagnosis not present

## 2016-06-29 DIAGNOSIS — T451X5D Adverse effect of antineoplastic and immunosuppressive drugs, subsequent encounter: Secondary | ICD-10-CM | POA: Diagnosis not present

## 2016-06-29 DIAGNOSIS — R002 Palpitations: Secondary | ICD-10-CM | POA: Diagnosis not present

## 2016-06-29 DIAGNOSIS — D469 Myelodysplastic syndrome, unspecified: Secondary | ICD-10-CM | POA: Diagnosis not present

## 2016-06-29 DIAGNOSIS — E876 Hypokalemia: Secondary | ICD-10-CM | POA: Diagnosis not present

## 2016-06-29 DIAGNOSIS — Z5181 Encounter for therapeutic drug level monitoring: Secondary | ICD-10-CM | POA: Diagnosis not present

## 2016-06-29 DIAGNOSIS — R5383 Other fatigue: Secondary | ICD-10-CM | POA: Diagnosis not present

## 2016-06-29 DIAGNOSIS — B259 Cytomegaloviral disease, unspecified: Secondary | ICD-10-CM | POA: Diagnosis not present

## 2016-06-29 DIAGNOSIS — R110 Nausea: Secondary | ICD-10-CM | POA: Diagnosis not present

## 2016-06-29 DIAGNOSIS — Z9484 Stem cells transplant status: Secondary | ICD-10-CM | POA: Diagnosis not present

## 2016-07-02 DIAGNOSIS — R002 Palpitations: Secondary | ICD-10-CM | POA: Diagnosis not present

## 2016-07-02 DIAGNOSIS — I10 Essential (primary) hypertension: Secondary | ICD-10-CM | POA: Diagnosis not present

## 2016-07-02 DIAGNOSIS — R5383 Other fatigue: Secondary | ICD-10-CM | POA: Diagnosis not present

## 2016-07-02 DIAGNOSIS — D469 Myelodysplastic syndrome, unspecified: Secondary | ICD-10-CM | POA: Diagnosis not present

## 2016-07-02 DIAGNOSIS — R262 Difficulty in walking, not elsewhere classified: Secondary | ICD-10-CM | POA: Diagnosis not present

## 2016-07-02 DIAGNOSIS — Z9484 Stem cells transplant status: Secondary | ICD-10-CM | POA: Diagnosis not present

## 2016-07-02 DIAGNOSIS — E2740 Unspecified adrenocortical insufficiency: Secondary | ICD-10-CM | POA: Diagnosis not present

## 2016-07-02 DIAGNOSIS — R5381 Other malaise: Secondary | ICD-10-CM | POA: Diagnosis not present

## 2016-07-02 DIAGNOSIS — B259 Cytomegaloviral disease, unspecified: Secondary | ICD-10-CM | POA: Diagnosis not present

## 2016-07-02 DIAGNOSIS — K219 Gastro-esophageal reflux disease without esophagitis: Secondary | ICD-10-CM | POA: Diagnosis not present

## 2016-07-02 DIAGNOSIS — Z79899 Other long term (current) drug therapy: Secondary | ICD-10-CM | POA: Diagnosis not present

## 2016-07-02 DIAGNOSIS — M6281 Muscle weakness (generalized): Secondary | ICD-10-CM | POA: Diagnosis not present

## 2016-07-02 DIAGNOSIS — R110 Nausea: Secondary | ICD-10-CM | POA: Diagnosis not present

## 2016-07-02 DIAGNOSIS — Z5181 Encounter for therapeutic drug level monitoring: Secondary | ICD-10-CM | POA: Diagnosis not present

## 2016-07-05 DIAGNOSIS — R5383 Other fatigue: Secondary | ICD-10-CM | POA: Diagnosis not present

## 2016-07-05 DIAGNOSIS — R5381 Other malaise: Secondary | ICD-10-CM | POA: Diagnosis not present

## 2016-07-05 DIAGNOSIS — Z9484 Stem cells transplant status: Secondary | ICD-10-CM | POA: Diagnosis not present

## 2016-07-05 DIAGNOSIS — Z5181 Encounter for therapeutic drug level monitoring: Secondary | ICD-10-CM | POA: Diagnosis not present

## 2016-07-05 DIAGNOSIS — D469 Myelodysplastic syndrome, unspecified: Secondary | ICD-10-CM | POA: Diagnosis not present

## 2016-07-05 DIAGNOSIS — B259 Cytomegaloviral disease, unspecified: Secondary | ICD-10-CM | POA: Diagnosis not present

## 2016-07-09 DIAGNOSIS — B259 Cytomegaloviral disease, unspecified: Secondary | ICD-10-CM | POA: Diagnosis not present

## 2016-07-09 DIAGNOSIS — K210 Gastro-esophageal reflux disease with esophagitis: Secondary | ICD-10-CM | POA: Diagnosis not present

## 2016-07-09 DIAGNOSIS — R600 Localized edema: Secondary | ICD-10-CM | POA: Diagnosis not present

## 2016-07-09 DIAGNOSIS — Z5181 Encounter for therapeutic drug level monitoring: Secondary | ICD-10-CM | POA: Diagnosis not present

## 2016-07-09 DIAGNOSIS — Z9484 Stem cells transplant status: Secondary | ICD-10-CM | POA: Diagnosis not present

## 2016-07-09 DIAGNOSIS — R5383 Other fatigue: Secondary | ICD-10-CM | POA: Diagnosis not present

## 2016-07-09 DIAGNOSIS — Z48298 Encounter for aftercare following other organ transplant: Secondary | ICD-10-CM | POA: Diagnosis not present

## 2016-07-09 DIAGNOSIS — E876 Hypokalemia: Secondary | ICD-10-CM | POA: Diagnosis not present

## 2016-07-09 DIAGNOSIS — Z79899 Other long term (current) drug therapy: Secondary | ICD-10-CM | POA: Diagnosis not present

## 2016-07-09 DIAGNOSIS — D469 Myelodysplastic syndrome, unspecified: Secondary | ICD-10-CM | POA: Diagnosis not present

## 2016-07-09 DIAGNOSIS — I10 Essential (primary) hypertension: Secondary | ICD-10-CM | POA: Diagnosis not present

## 2016-07-09 DIAGNOSIS — R630 Anorexia: Secondary | ICD-10-CM | POA: Diagnosis not present

## 2016-07-09 DIAGNOSIS — E2740 Unspecified adrenocortical insufficiency: Secondary | ICD-10-CM | POA: Diagnosis not present

## 2016-07-09 DIAGNOSIS — R5381 Other malaise: Secondary | ICD-10-CM | POA: Diagnosis not present

## 2016-07-10 DIAGNOSIS — Z9484 Stem cells transplant status: Secondary | ICD-10-CM | POA: Diagnosis not present

## 2016-07-10 DIAGNOSIS — D469 Myelodysplastic syndrome, unspecified: Secondary | ICD-10-CM | POA: Diagnosis not present

## 2016-07-15 DIAGNOSIS — R5381 Other malaise: Secondary | ICD-10-CM | POA: Diagnosis not present

## 2016-07-15 DIAGNOSIS — B259 Cytomegaloviral disease, unspecified: Secondary | ICD-10-CM | POA: Diagnosis not present

## 2016-07-15 DIAGNOSIS — D469 Myelodysplastic syndrome, unspecified: Secondary | ICD-10-CM | POA: Diagnosis not present

## 2016-07-15 DIAGNOSIS — Z5181 Encounter for therapeutic drug level monitoring: Secondary | ICD-10-CM | POA: Diagnosis not present

## 2016-07-15 DIAGNOSIS — R5383 Other fatigue: Secondary | ICD-10-CM | POA: Diagnosis not present

## 2016-07-15 DIAGNOSIS — Z9484 Stem cells transplant status: Secondary | ICD-10-CM | POA: Diagnosis not present

## 2016-07-16 DIAGNOSIS — D469 Myelodysplastic syndrome, unspecified: Secondary | ICD-10-CM | POA: Diagnosis not present

## 2016-07-16 DIAGNOSIS — E2740 Unspecified adrenocortical insufficiency: Secondary | ICD-10-CM | POA: Diagnosis not present

## 2016-07-16 DIAGNOSIS — Z9484 Stem cells transplant status: Secondary | ICD-10-CM | POA: Diagnosis not present

## 2016-07-16 DIAGNOSIS — Z79899 Other long term (current) drug therapy: Secondary | ICD-10-CM | POA: Diagnosis not present

## 2016-07-16 DIAGNOSIS — R05 Cough: Secondary | ICD-10-CM | POA: Diagnosis not present

## 2016-07-16 DIAGNOSIS — B259 Cytomegaloviral disease, unspecified: Secondary | ICD-10-CM | POA: Diagnosis not present

## 2016-07-16 DIAGNOSIS — Z1379 Encounter for other screening for genetic and chromosomal anomalies: Secondary | ICD-10-CM | POA: Diagnosis not present

## 2016-07-16 DIAGNOSIS — E876 Hypokalemia: Secondary | ICD-10-CM | POA: Diagnosis not present

## 2016-07-16 DIAGNOSIS — R5383 Other fatigue: Secondary | ICD-10-CM | POA: Diagnosis not present

## 2016-07-16 DIAGNOSIS — R630 Anorexia: Secondary | ICD-10-CM | POA: Diagnosis not present

## 2016-07-16 DIAGNOSIS — Z79891 Long term (current) use of opiate analgesic: Secondary | ICD-10-CM | POA: Diagnosis not present

## 2016-07-16 DIAGNOSIS — Z792 Long term (current) use of antibiotics: Secondary | ICD-10-CM | POA: Diagnosis not present

## 2016-07-16 DIAGNOSIS — Z5181 Encounter for therapeutic drug level monitoring: Secondary | ICD-10-CM | POA: Diagnosis not present

## 2016-07-16 DIAGNOSIS — K210 Gastro-esophageal reflux disease with esophagitis: Secondary | ICD-10-CM | POA: Diagnosis not present

## 2016-07-16 DIAGNOSIS — I10 Essential (primary) hypertension: Secondary | ICD-10-CM | POA: Diagnosis not present

## 2016-07-16 DIAGNOSIS — R110 Nausea: Secondary | ICD-10-CM | POA: Diagnosis not present

## 2016-07-16 DIAGNOSIS — R5381 Other malaise: Secondary | ICD-10-CM | POA: Diagnosis not present

## 2016-07-17 NOTE — Progress Notes (Signed)
Received a phone call from Sharol Given from [facility] stating that pt will be done with treatments and will need to be referred back to [facility]. Will need to set up follow up appt with lab/md. Confirmed new time and date on 07/23/16. NP will contact Dr.Gudena next week to give him update about pt treatment course. Notified Dr.Gudena and is aware.

## 2016-07-19 DIAGNOSIS — D469 Myelodysplastic syndrome, unspecified: Secondary | ICD-10-CM | POA: Diagnosis not present

## 2016-07-19 DIAGNOSIS — B259 Cytomegaloviral disease, unspecified: Secondary | ICD-10-CM | POA: Diagnosis not present

## 2016-07-19 DIAGNOSIS — M47814 Spondylosis without myelopathy or radiculopathy, thoracic region: Secondary | ICD-10-CM | POA: Diagnosis not present

## 2016-07-19 DIAGNOSIS — R05 Cough: Secondary | ICD-10-CM | POA: Diagnosis not present

## 2016-07-19 DIAGNOSIS — R5383 Other fatigue: Secondary | ICD-10-CM | POA: Diagnosis not present

## 2016-07-19 DIAGNOSIS — R5381 Other malaise: Secondary | ICD-10-CM | POA: Diagnosis not present

## 2016-07-19 DIAGNOSIS — Z9484 Stem cells transplant status: Secondary | ICD-10-CM | POA: Diagnosis not present

## 2016-07-19 DIAGNOSIS — Z5181 Encounter for therapeutic drug level monitoring: Secondary | ICD-10-CM | POA: Diagnosis not present

## 2016-07-19 DIAGNOSIS — K210 Gastro-esophageal reflux disease with esophagitis: Secondary | ICD-10-CM | POA: Diagnosis not present

## 2016-07-20 DIAGNOSIS — R05 Cough: Secondary | ICD-10-CM | POA: Diagnosis not present

## 2016-07-20 DIAGNOSIS — D469 Myelodysplastic syndrome, unspecified: Secondary | ICD-10-CM | POA: Diagnosis not present

## 2016-07-20 DIAGNOSIS — R5383 Other fatigue: Secondary | ICD-10-CM | POA: Diagnosis not present

## 2016-07-20 DIAGNOSIS — K210 Gastro-esophageal reflux disease with esophagitis: Secondary | ICD-10-CM | POA: Diagnosis not present

## 2016-07-20 DIAGNOSIS — Z9484 Stem cells transplant status: Secondary | ICD-10-CM | POA: Diagnosis not present

## 2016-07-20 DIAGNOSIS — R5381 Other malaise: Secondary | ICD-10-CM | POA: Diagnosis not present

## 2016-07-23 ENCOUNTER — Ambulatory Visit: Payer: Medicare Other | Admitting: Hematology and Oncology

## 2016-07-23 ENCOUNTER — Other Ambulatory Visit: Payer: Self-pay | Admitting: Emergency Medicine

## 2016-07-23 ENCOUNTER — Other Ambulatory Visit: Payer: Medicare Other

## 2016-07-23 DIAGNOSIS — Z9484 Stem cells transplant status: Secondary | ICD-10-CM | POA: Diagnosis not present

## 2016-07-23 DIAGNOSIS — R5381 Other malaise: Secondary | ICD-10-CM | POA: Diagnosis not present

## 2016-07-23 DIAGNOSIS — R600 Localized edema: Secondary | ICD-10-CM | POA: Diagnosis not present

## 2016-07-23 DIAGNOSIS — Z79899 Other long term (current) drug therapy: Secondary | ICD-10-CM | POA: Diagnosis not present

## 2016-07-23 DIAGNOSIS — Z8619 Personal history of other infectious and parasitic diseases: Secondary | ICD-10-CM | POA: Diagnosis not present

## 2016-07-23 DIAGNOSIS — R5383 Other fatigue: Secondary | ICD-10-CM | POA: Diagnosis not present

## 2016-07-23 DIAGNOSIS — I10 Essential (primary) hypertension: Secondary | ICD-10-CM | POA: Diagnosis not present

## 2016-07-23 DIAGNOSIS — R002 Palpitations: Secondary | ICD-10-CM | POA: Diagnosis not present

## 2016-07-23 DIAGNOSIS — Z452 Encounter for adjustment and management of vascular access device: Secondary | ICD-10-CM | POA: Diagnosis not present

## 2016-07-23 DIAGNOSIS — Z48298 Encounter for aftercare following other organ transplant: Secondary | ICD-10-CM | POA: Diagnosis not present

## 2016-07-23 DIAGNOSIS — Z9481 Bone marrow transplant status: Secondary | ICD-10-CM

## 2016-07-23 DIAGNOSIS — K5900 Constipation, unspecified: Secondary | ICD-10-CM | POA: Diagnosis not present

## 2016-07-23 DIAGNOSIS — K219 Gastro-esophageal reflux disease without esophagitis: Secondary | ICD-10-CM | POA: Diagnosis not present

## 2016-07-23 DIAGNOSIS — D469 Myelodysplastic syndrome, unspecified: Secondary | ICD-10-CM

## 2016-07-23 DIAGNOSIS — Z5181 Encounter for therapeutic drug level monitoring: Secondary | ICD-10-CM | POA: Diagnosis not present

## 2016-08-01 DIAGNOSIS — Z8619 Personal history of other infectious and parasitic diseases: Secondary | ICD-10-CM | POA: Diagnosis not present

## 2016-08-01 DIAGNOSIS — Z9484 Stem cells transplant status: Secondary | ICD-10-CM | POA: Diagnosis not present

## 2016-08-01 DIAGNOSIS — D469 Myelodysplastic syndrome, unspecified: Secondary | ICD-10-CM | POA: Diagnosis not present

## 2016-08-01 DIAGNOSIS — Z5181 Encounter for therapeutic drug level monitoring: Secondary | ICD-10-CM | POA: Diagnosis not present

## 2016-08-06 ENCOUNTER — Other Ambulatory Visit (HOSPITAL_BASED_OUTPATIENT_CLINIC_OR_DEPARTMENT_OTHER): Payer: Medicare Other

## 2016-08-06 ENCOUNTER — Other Ambulatory Visit: Payer: Self-pay | Admitting: Emergency Medicine

## 2016-08-06 ENCOUNTER — Encounter: Payer: Self-pay | Admitting: Hematology and Oncology

## 2016-08-06 ENCOUNTER — Ambulatory Visit (HOSPITAL_BASED_OUTPATIENT_CLINIC_OR_DEPARTMENT_OTHER): Payer: Medicare Other | Admitting: Hematology and Oncology

## 2016-08-06 DIAGNOSIS — Z9481 Bone marrow transplant status: Secondary | ICD-10-CM | POA: Diagnosis not present

## 2016-08-06 DIAGNOSIS — D469 Myelodysplastic syndrome, unspecified: Secondary | ICD-10-CM | POA: Diagnosis not present

## 2016-08-06 DIAGNOSIS — D89813 Graft-versus-host disease, unspecified: Secondary | ICD-10-CM

## 2016-08-06 DIAGNOSIS — D4621 Refractory anemia with excess of blasts 1: Secondary | ICD-10-CM

## 2016-08-06 DIAGNOSIS — E2740 Unspecified adrenocortical insufficiency: Secondary | ICD-10-CM

## 2016-08-06 LAB — COMPREHENSIVE METABOLIC PANEL
ALBUMIN: 3.4 g/dL — AB (ref 3.5–5.0)
ALK PHOS: 83 U/L (ref 40–150)
ALT: 23 U/L (ref 0–55)
AST: 18 U/L (ref 5–34)
Anion Gap: 9 mEq/L (ref 3–11)
BUN: 10.5 mg/dL (ref 7.0–26.0)
CHLORIDE: 105 meq/L (ref 98–109)
CO2: 23 meq/L (ref 22–29)
Calcium: 9.2 mg/dL (ref 8.4–10.4)
Creatinine: 1.3 mg/dL (ref 0.7–1.3)
EGFR: 54 mL/min/{1.73_m2} — ABNORMAL LOW (ref >90)
GLUCOSE: 152 mg/dL — AB (ref 70–140)
POTASSIUM: 4.5 meq/L (ref 3.5–5.1)
SODIUM: 137 meq/L (ref 136–145)
Total Bilirubin: 0.44 mg/dL (ref 0.20–1.20)
Total Protein: 6.4 g/dL (ref 6.4–8.3)

## 2016-08-06 LAB — CBC WITH DIFFERENTIAL/PLATELET
BASO%: 0.6 % (ref 0.0–2.0)
BASOS ABS: 0 10*3/uL (ref 0.0–0.1)
EOS%: 3.4 % (ref 0.0–7.0)
Eosinophils Absolute: 0.3 10*3/uL (ref 0.0–0.5)
HCT: 33.6 % — ABNORMAL LOW (ref 38.4–49.9)
HEMOGLOBIN: 11.6 g/dL — AB (ref 13.0–17.1)
LYMPH%: 20.5 % (ref 14.0–49.0)
MCH: 33.8 pg — AB (ref 27.2–33.4)
MCHC: 34.6 g/dL (ref 32.0–36.0)
MCV: 97.8 fL (ref 79.3–98.0)
MONO#: 1.1 10*3/uL — ABNORMAL HIGH (ref 0.1–0.9)
MONO%: 12.5 % (ref 0.0–14.0)
NEUT#: 5.5 10*3/uL (ref 1.5–6.5)
NEUT%: 63 % (ref 39.0–75.0)
Platelets: 144 10*3/uL (ref 140–400)
RBC: 3.44 10*6/uL — ABNORMAL LOW (ref 4.20–5.82)
RDW: 16.7 % — AB (ref 11.0–14.6)
WBC: 8.8 10*3/uL (ref 4.0–10.3)
lymph#: 1.8 10*3/uL (ref 0.9–3.3)

## 2016-08-06 LAB — MAGNESIUM: Magnesium: 1.6 mg/dl (ref 1.5–2.5)

## 2016-08-06 NOTE — Progress Notes (Signed)
Patient Care Team: [name], MD as PCP - General (Family Medicine)  DIAGNOSIS:  Encounter Diagnosis  Name Primary?  . MDS (myelodysplastic syndrome) ([place])     SUMMARY OF ONCOLOGIC HISTORY:   MDS (myelodysplastic syndrome) ([place])   12/01/2015 Initial Diagnosis    MDS-RAEB-2: Bone marrow aspiration biopsy: Hypocellular 10-30% severity, increased blasts 60% on aspirate and 20% on biopsy by CD34 staining, flow failed to reveal increase in blasts (could be due to sampling), mild dysplasia megakaryocytes and erythrocyt      12/14/2015 Pathology Results    Dr.Horwitz ([facility]) Repeat bone marrow biopsy for FLT3 which is negative, morphologically blast count is 8-10%, however flow is showing 2% only. No evidence of numeric abnormalities or deletions involving chromosomes 5, 7, 8 or 20 by FISH. RAEB-I, intermediate II risk      12/19/2015 - 02/29/2016 Chemotherapy    Decitabine 3 cycles       04/17/2016 Procedure    Matched unrelated donor bone marrow transplant with busulfan/cyclophosphamide myeloablative conditioning; Complications: Esophagitis during conditioning, left upper extremity skin lesion treated with vancomycin, rash on the scalp, adrenal insufficiency fatigue and weight loss 05/27/2016 on hydrocortisone replacement; CMV reactivation 09/27/2015 treated with ganciclovir      07/16/2016 Pathology Results    Day 90: Bone marrow biopsy: Trilineage hematopoiesis, no significant dysplasia, no increase in blasts greater than 98% donor       CHIEF COMPLIANT: Follow-up after recent bone marrow transplant, day 112  INTERVAL HISTORY: Brandon [name] is a 66 year old gentleman with MDS RAEB-1 who initially underwent decitabine treatment followed by allogenic stem cell transplant at [facility]. He is here today for a follow-up. He has not required blood or platelet transfusions. He has recovered fairly well from the effects of transplant. He was found to have adrenal insufficiency  and is currently on steroids. He is taking tacrolimus for graft-versus-host disease. He is also on prophylactic medications to prevent infections. He is here today accompanied by his wife. They're very happy with the the fantastic care that they received at [facility]. He had a bone marrow biopsy on day 90 post transplant and was found to have 98% donor engraftment. He apparently engrafted so quickly that he did not require much blood or platelets. He apparently only required 2 units of platelets of 1 units of blood throughout the entire transplant process.  REVIEW OF SYSTEMS:   Constitutional: Denies fevers, chills or abnormal weight loss Eyes: Denies blurriness of vision Ears, nose, mouth, throat, and face: Denies mucositis or sore throat Respiratory: Chronic dry cough probably related to acid reflux Cardiovascular: Denies palpitation, chest discomfort Gastrointestinal:  Denies nausea, heartburn or change in bowel habits Skin: Denies abnormal skin rashes Lymphatics: Denies new lymphadenopathy or easy bruising Neurological:Denies numbness, tingling or new weaknesses Behavioral/Psych: Mood is stable, no new changes  Extremities: No lower extremity edema  All other systems were reviewed with the patient and are negative.  I have reviewed the past medical history, past surgical history, social history and family history with the patient and they are unchanged from previous note.  ALLERGIES:  has No Known Allergies.  MEDICATIONS:  Current Outpatient Prescriptions  Medication Sig Dispense Refill  . acetaminophen (TYLENOL) 325 MG tablet Take 2 tablets (650 mg total) by mouth every 4 (four) hours as needed for mild pain, moderate pain or headache. (Patient not taking: Reported on 01/09/2016)    . atenolol (TENORMIN) 50 MG tablet Take 1 tablet (50 mg total) by mouth daily.  90 tablet 3  . ceFEPIme 2 g in dextrose 5 % 50 mL Inject 2 g into the vein every 8 (eight) hours.    . folic acid  (FOLVITE) 062 MCG tablet Take 1 tablet (400 mcg total) by mouth daily. 30 tablet 1  . hydrocortisone (ANUSOL-HC) 2.5 % rectal cream Place rectally 2 (two) times daily. 30 g 0  . Multiple Vitamin (MULTIVITAMIN WITH MINERALS) TABS tablet Take 1 tablet by mouth daily.    Marland Kitchen omeprazole (PRILOSEC OTC) 20 MG tablet take 1 tablet by mouth once daily 90 tablet 3  . polyethylene glycol (MIRALAX / GLYCOLAX) packet Take 17 g by mouth daily as needed for mild constipation.    . simvastatin (ZOCOR) 40 MG tablet take 1 tablet by mouth once daily 90 tablet 2  . vancomycin (VANCOCIN) 1-5 GM/200ML-% SOLN Inject 200 mLs (1,000 mg total) into the vein every 8 (eight) hours. 4000 mL    No current facility-administered medications for this visit.     PHYSICAL EXAMINATION: ECOG PERFORMANCE STATUS: 1 - Symptomatic but completely ambulatory  Vitals:   08/06/16 0852  BP: 135/82  Pulse: 66  Resp: 18  Temp: 98 F (36.7 C)   Filed Weights   08/06/16 0852  Weight: 206 lb 9.6 oz (93.7 kg)    GENERAL:alert, no distress and comfortable SKIN: skin color, texture, turgor are normal, no rashes or significant lesions EYES: normal, Conjunctiva are pink and non-injected, sclera clear OROPHARYNX:no exudate, no erythema and lips, buccal mucosa, and tongue normal  NECK: supple, thyroid normal size, non-tender, without nodularity LYMPH:  no palpable lymphadenopathy in the cervical, axillary or inguinal LUNGS: clear to auscultation and percussion with normal breathing effort HEART: regular rate & rhythm and no murmurs and no lower extremity edema ABDOMEN:abdomen soft, non-tender and normal bowel sounds MUSCULOSKELETAL:no cyanosis of digits and no clubbing  NEURO: alert & oriented x 3 with fluent speech, no focal motor/sensory deficits EXTREMITIES: No lower extremity edema  LABORATORY DATA:  I have reviewed the data as listed   Chemistry      Component Value Date/Time   NA 137 08/06/2016 0838   K 4.5 08/06/2016  0838   CL 103 01/09/2016 0525   CO2 23 08/06/2016 0838   BUN 10.5 08/06/2016 0838   CREATININE 1.3 08/06/2016 0838   GLU 107 03/14/2016      Component Value Date/Time   CALCIUM 9.2 08/06/2016 0838   ALKPHOS 83 08/06/2016 0838   AST 18 08/06/2016 0838   ALT 23 08/06/2016 0838   BILITOT 0.44 08/06/2016 0838       Lab Results  Component Value Date   WBC 8.8 08/06/2016   HGB 11.6 (L) 08/06/2016   HCT 33.6 (L) 08/06/2016   MCV 97.8 08/06/2016   PLT 144 08/06/2016   NEUTROABS 5.5 08/06/2016    ASSESSMENT & PLAN:  MDS (myelodysplastic syndrome) (HCC) MDS RAEB-1: failed decitabine and was treated with matched unrelated donor allogenic stem cell transplant with myeloablative conditioning using busulfan/cyclophosphamide.  Transplant date 69/48/5462  Complications after transplant: 1. Adrenal insufficiency requiring steroids 2. CMV reactivation: Required Ganciclovir 3. Esophagitis and duodenitis during induction  Current medications: Acyclovir, posaconazole, Bactrim, magnesium 437m 4 times a day. Transfusion goals:  1. For hemoglobin less than 8 g: Irradiated/filtered/leuco-reduced 2. For platelets less than 10: Platelet transfusions that are also radiated and filtered and leucoreduced 3. For GVHD prophylaxis: Patient completed 4/4 doses of methotrexate and currently on tacrolimus started 04/15/2016 0.5 mg daily  Return to clinic  weekly for blood count checks and tacrolimus levels. Return to clinic in one month to see me. Patient understands that we are primarily here locally to support him. All of his treatment decisions have to be taken at [facility].  I spent 40 minutes talking to the patient of which more than half was spent in counseling and coordination of care. I spent over an hour reviewing his records from [facility].  Orders Placed This Encounter  Procedures  . CBC with Differential    Standing Status:   Standing    Number of Occurrences:   20     Standing Expiration Date:   08/06/2017  . Comprehensive metabolic panel    Standing Status:   Standing    Number of Occurrences:   20    Standing Expiration Date:   08/06/2017  . Magnesium    Standing Status:   Standing    Number of Occurrences:   20    Standing Expiration Date:   08/06/2017  . Tacrolimus level    Standing Status:   Standing    Number of Occurrences:   20    Standing Expiration Date:   08/06/2017   The patient has a good understanding of the overall plan. he agrees with it. he will call with any problems that may develop before the next visit here.   Rulon Eisenmenger, MD 08/06/16

## 2016-08-06 NOTE — Assessment & Plan Note (Signed)
MDS RAEB-1: failed decitabine and was treated with matched unrelated donor allogenic stem cell transplant with myeloablative conditioning using busulfan/cyclophosphamide.  Transplant date [id number]  Complications after transplant: 1. Adrenal insufficiency requiring steroids 2. CMV reactivation: Required Ganciclovir 3. Esophagitis and duodenitis during induction  Current medications: Acyclovir, posaconazole, Bactrim, magnesium 400mg  4 times a day. Transfusion goals:  1. For hemoglobin less than 8 g: Irradiated/filtered/leuco-reduced 2. For platelets less than 10: Platelet transfusions that are also radiated and filtered and leucoreduced 3. For GVHD prophylaxis: Patient completed 4/4 doses of methotrexate and currently on tacrolimus started 04/15/2016 0.5 mg daily Return to clinic 3 times a week for blood count check

## 2016-08-06 NOTE — Addendum Note (Signed)
Addended by: Jaci Carrel A on: 08/06/2016 02:23 PM   Modules accepted: Orders

## 2016-08-08 DIAGNOSIS — R5383 Other fatigue: Secondary | ICD-10-CM | POA: Diagnosis not present

## 2016-08-08 DIAGNOSIS — Z5181 Encounter for therapeutic drug level monitoring: Secondary | ICD-10-CM | POA: Diagnosis not present

## 2016-08-08 DIAGNOSIS — Z9484 Stem cells transplant status: Secondary | ICD-10-CM | POA: Diagnosis not present

## 2016-08-08 DIAGNOSIS — Z8619 Personal history of other infectious and parasitic diseases: Secondary | ICD-10-CM | POA: Diagnosis not present

## 2016-08-08 DIAGNOSIS — D469 Myelodysplastic syndrome, unspecified: Secondary | ICD-10-CM | POA: Diagnosis not present

## 2016-08-08 DIAGNOSIS — R5381 Other malaise: Secondary | ICD-10-CM | POA: Diagnosis not present

## 2016-08-08 LAB — TACROLIMUS LEVEL: TACROLIMUS LVL: 8.5 ng/mL (ref 2.0–20.0)

## 2016-08-09 ENCOUNTER — Telehealth: Payer: Self-pay | Admitting: Hematology and Oncology

## 2016-08-09 NOTE — Telephone Encounter (Signed)
Pt called to [facility] lab on 2/19

## 2016-08-13 ENCOUNTER — Other Ambulatory Visit: Payer: Medicare Other

## 2016-08-15 DIAGNOSIS — D469 Myelodysplastic syndrome, unspecified: Secondary | ICD-10-CM | POA: Diagnosis not present

## 2016-08-15 DIAGNOSIS — Z5181 Encounter for therapeutic drug level monitoring: Secondary | ICD-10-CM | POA: Diagnosis not present

## 2016-08-15 DIAGNOSIS — Z8619 Personal history of other infectious and parasitic diseases: Secondary | ICD-10-CM | POA: Diagnosis not present

## 2016-08-15 DIAGNOSIS — Z9484 Stem cells transplant status: Secondary | ICD-10-CM | POA: Diagnosis not present

## 2016-08-17 ENCOUNTER — Telehealth: Payer: Self-pay

## 2016-08-17 NOTE — Telephone Encounter (Signed)
Pt called to notify Dr.Gudena that his Dr at [facility] would like to have him come for [facility] on a weekly basis. Pt wanted to see if he can cancel his lab appts for [facility]. Pt also states that his dr would like to have him continue to see Dr.Gudena on a 4 week basis to follow up on his status and condition. Told pt that his lab are cancelled and he has an upcoming appt with Dr.Gudena on 3/12. Advised pt to bring a copy of his lab work when he comes in to see Dr.Gudena. Pt verbalized understanding and will call for more updates.

## 2016-08-20 ENCOUNTER — Other Ambulatory Visit: Payer: Medicare Other

## 2016-08-22 DIAGNOSIS — Z8619 Personal history of other infectious and parasitic diseases: Secondary | ICD-10-CM | POA: Diagnosis not present

## 2016-08-22 DIAGNOSIS — G629 Polyneuropathy, unspecified: Secondary | ICD-10-CM | POA: Diagnosis not present

## 2016-08-22 DIAGNOSIS — Z006 Encounter for examination for normal comparison and control in clinical research program: Secondary | ICD-10-CM | POA: Diagnosis not present

## 2016-08-22 DIAGNOSIS — C9200 Acute myeloblastic leukemia, not having achieved remission: Secondary | ICD-10-CM | POA: Diagnosis not present

## 2016-08-22 DIAGNOSIS — E7800 Pure hypercholesterolemia, unspecified: Secondary | ICD-10-CM | POA: Diagnosis not present

## 2016-08-22 DIAGNOSIS — K210 Gastro-esophageal reflux disease with esophagitis: Secondary | ICD-10-CM | POA: Diagnosis not present

## 2016-08-22 DIAGNOSIS — R05 Cough: Secondary | ICD-10-CM | POA: Diagnosis not present

## 2016-08-22 DIAGNOSIS — M1990 Unspecified osteoarthritis, unspecified site: Secondary | ICD-10-CM | POA: Diagnosis not present

## 2016-08-22 DIAGNOSIS — Z5181 Encounter for therapeutic drug level monitoring: Secondary | ICD-10-CM | POA: Diagnosis not present

## 2016-08-22 DIAGNOSIS — R002 Palpitations: Secondary | ICD-10-CM | POA: Diagnosis not present

## 2016-08-22 DIAGNOSIS — Z9484 Stem cells transplant status: Secondary | ICD-10-CM | POA: Diagnosis not present

## 2016-08-22 DIAGNOSIS — Z79899 Other long term (current) drug therapy: Secondary | ICD-10-CM | POA: Diagnosis not present

## 2016-08-22 DIAGNOSIS — I10 Essential (primary) hypertension: Secondary | ICD-10-CM | POA: Diagnosis not present

## 2016-08-22 DIAGNOSIS — E2740 Unspecified adrenocortical insufficiency: Secondary | ICD-10-CM | POA: Diagnosis not present

## 2016-08-22 DIAGNOSIS — D469 Myelodysplastic syndrome, unspecified: Secondary | ICD-10-CM | POA: Diagnosis not present

## 2016-08-27 ENCOUNTER — Other Ambulatory Visit: Payer: Medicare Other

## 2016-08-29 DIAGNOSIS — E7800 Pure hypercholesterolemia, unspecified: Secondary | ICD-10-CM | POA: Diagnosis not present

## 2016-08-29 DIAGNOSIS — Z006 Encounter for examination for normal comparison and control in clinical research program: Secondary | ICD-10-CM | POA: Diagnosis not present

## 2016-08-29 DIAGNOSIS — Z8619 Personal history of other infectious and parasitic diseases: Secondary | ICD-10-CM | POA: Diagnosis not present

## 2016-08-29 DIAGNOSIS — Z9484 Stem cells transplant status: Secondary | ICD-10-CM | POA: Diagnosis not present

## 2016-08-29 DIAGNOSIS — C9200 Acute myeloblastic leukemia, not having achieved remission: Secondary | ICD-10-CM | POA: Diagnosis not present

## 2016-08-29 DIAGNOSIS — D469 Myelodysplastic syndrome, unspecified: Secondary | ICD-10-CM | POA: Diagnosis not present

## 2016-08-29 DIAGNOSIS — R002 Palpitations: Secondary | ICD-10-CM | POA: Diagnosis not present

## 2016-08-29 DIAGNOSIS — Z79899 Other long term (current) drug therapy: Secondary | ICD-10-CM | POA: Diagnosis not present

## 2016-08-29 DIAGNOSIS — E2740 Unspecified adrenocortical insufficiency: Secondary | ICD-10-CM | POA: Diagnosis not present

## 2016-08-29 DIAGNOSIS — M1990 Unspecified osteoarthritis, unspecified site: Secondary | ICD-10-CM | POA: Diagnosis not present

## 2016-08-29 DIAGNOSIS — I10 Essential (primary) hypertension: Secondary | ICD-10-CM | POA: Diagnosis not present

## 2016-08-29 DIAGNOSIS — K210 Gastro-esophageal reflux disease with esophagitis: Secondary | ICD-10-CM | POA: Diagnosis not present

## 2016-08-29 DIAGNOSIS — R05 Cough: Secondary | ICD-10-CM | POA: Diagnosis not present

## 2016-08-29 DIAGNOSIS — Z5181 Encounter for therapeutic drug level monitoring: Secondary | ICD-10-CM | POA: Diagnosis not present

## 2016-09-03 ENCOUNTER — Ambulatory Visit (HOSPITAL_BASED_OUTPATIENT_CLINIC_OR_DEPARTMENT_OTHER): Payer: Medicare Other | Admitting: Hematology and Oncology

## 2016-09-03 ENCOUNTER — Other Ambulatory Visit: Payer: Medicare Other

## 2016-09-03 ENCOUNTER — Encounter: Payer: Self-pay | Admitting: Hematology and Oncology

## 2016-09-03 DIAGNOSIS — D4621 Refractory anemia with excess of blasts 1: Secondary | ICD-10-CM | POA: Diagnosis not present

## 2016-09-03 DIAGNOSIS — D469 Myelodysplastic syndrome, unspecified: Secondary | ICD-10-CM

## 2016-09-03 DIAGNOSIS — D89813 Graft-versus-host disease, unspecified: Secondary | ICD-10-CM

## 2016-09-03 DIAGNOSIS — E2740 Unspecified adrenocortical insufficiency: Secondary | ICD-10-CM

## 2016-09-03 MED ORDER — OMEPRAZOLE MAGNESIUM 20 MG PO TBEC: 40.0000 mg | DELAYED_RELEASE_TABLET | Freq: Two times a day (BID) | ORAL | Status: DC

## 2016-09-03 MED ORDER — MAGNESIUM OXIDE 400 MG PO CAPS: 400.0000 mg | ORAL_CAPSULE | Freq: Every day | ORAL | Status: DC

## 2016-09-03 NOTE — Progress Notes (Signed)
Patient Care Team: [name], MD as PCP - General (Family Medicine)  DIAGNOSIS:  Encounter Diagnosis  Name Primary?  . MDS (myelodysplastic syndrome) ([place])     SUMMARY OF ONCOLOGIC HISTORY:   MDS (myelodysplastic syndrome) ([place])   12/01/2015 Initial Diagnosis    MDS-RAEB-2: Bone marrow aspiration biopsy: Hypocellular 10-30% severity, increased blasts 60% on aspirate and 20% on biopsy by CD34 staining, flow failed to reveal increase in blasts (could be due to sampling), mild dysplasia megakaryocytes and erythrocyt      12/14/2015 Pathology Results    Dr.Horwitz ([facility]) Repeat bone marrow biopsy for FLT3 which is negative, morphologically blast count is 8-10%, however flow is showing 2% only. No evidence of numeric abnormalities or deletions involving chromosomes 5, 7, 8 or 20 by FISH. RAEB-I, intermediate II risk      12/19/2015 - 02/29/2016 Chemotherapy    Decitabine 3 cycles       04/17/2016 Procedure    Matched unrelated donor bone marrow transplant with busulfan/cyclophosphamide myeloablative conditioning; Complications: Esophagitis during conditioning, left upper extremity skin lesion treated with vancomycin, rash on the scalp, adrenal insufficiency fatigue and weight loss 05/27/2016 on hydrocortisone replacement; CMV reactivation 09/27/2015 treated with ganciclovir      07/16/2016 Pathology Results    Day 90: Bone marrow biopsy: Trilineage hematopoiesis, no significant dysplasia, no increase in blasts greater than 98% donor       CHIEF COMPLIANT: Follow-up of MDS treated with allogeneic stem cell transplant  INTERVAL HISTORY: GLADSTONE ROSAS is a 66 year old with above-mentioned history of MDS refractory anemia with excess blasts who did not respond to decitabine and had to undergo allogeneic stem cell transplant. He is recovering very well from the transplant and had mild GVHD. Because of this E was put on a clinical trial and appears to be tolerating it fairly well.  For the past 2 weeks his energy levels have improved significantly. He is wanting to do more activities and feels jittery about it.  REVIEW OF SYSTEMS:   Constitutional: Denies fevers, chills or abnormal weight loss Eyes: Denies blurriness of vision Ears, nose, mouth, throat, and face: Denies mucositis or sore throat Respiratory: Denies cough, dyspnea or wheezes Cardiovascular: Denies palpitation, chest discomfort Gastrointestinal:  Denies nausea, heartburn or change in bowel habits Skin: Denies abnormal skin rashes Lymphatics: Denies new lymphadenopathy or easy bruising Neurological: Jittery, neuropathy in hands and feet Behavioral/Psych: Mood is stable, no new changes  Extremities: No lower extremity edema  All other systems were reviewed with the patient and are negative.  I have reviewed the past medical history, past surgical history, social history and family history with the patient and they are unchanged from previous note.  ALLERGIES:  has No Known Allergies.  MEDICATIONS:  Current Outpatient Prescriptions  Medication Sig Dispense Refill  . acyclovir (ZOVIRAX) 400 MG tablet Take 400 mg by mouth 2 (two) times daily.     Marland Kitchen atenolol (TENORMIN) 50 MG tablet Take 1 tablet (50 mg total) by mouth daily. (Patient taking differently: Take 25 mg by mouth every evening. ) 90 tablet 3  . fluticasone (FLONASE) 50 MCG/ACT nasal spray     . hydrocortisone (ANUSOL-HC) 2.5 % rectal cream Place rectally 2 (two) times daily. 30 g 0  . HYDROCORTISONE PO Take 10 mg by mouth. Take 2 tablets in AM and 1 tablet in PM    . loratadine (CLARITIN) 10 MG tablet Take 10 mg by mouth daily as needed.    Marland Kitchen LORazepam (ATIVAN) 0.5  MG tablet Take 0.5 mg by mouth as needed.    . Magnesium Oxide 400 MG CAPS Take 400 mg by mouth 4 (four) times daily.    . Multiple Vitamin (MULTIVITAMIN WITH MINERALS) TABS tablet Take 1 tablet by mouth daily.    Marland Kitchen omeprazole (PRILOSEC OTC) 20 MG tablet take 1 tablet by mouth once  daily (Patient taking differently: Take 40 mg by mouth 2 (two) times daily. take 1 tablet by mouth once daily) 90 tablet 3  . oxyCODONE (OXY IR/ROXICODONE) 5 MG immediate release tablet     . polyethylene glycol (MIRALAX / GLYCOLAX) packet Take 17 g by mouth daily as needed for mild constipation.    . posaconazole (NOXAFIL) 100 MG TBEC delayed-release tablet Take 300 mg by mouth daily.    . prochlorperazine (COMPAZINE) 5 MG tablet Take 5 mg by mouth as needed.    . ranitidine (ZANTAC) 150 MG capsule Take 150 mg by mouth 2 (two) times daily.    Marland Kitchen senna-docusate (SENOKOT-S) 8.6-50 MG tablet Take 1 tablet by mouth Nightly.    . sulfamethoxazole-trimethoprim (BACTRIM,SEPTRA) 400-80 MG tablet Take 1 tablet by mouth daily.    . tacrolimus (PROGRAF) 0.5 MG capsule Take 0.5 mg by mouth daily.  5   No current facility-administered medications for this visit.     PHYSICAL EXAMINATION: ECOG PERFORMANCE STATUS: 1 - Symptomatic but completely ambulatory  Vitals:   09/03/16 1005  BP: (!) 150/93  Pulse: 72  Resp: 20  Temp: 97.9 F (36.6 C)   Filed Weights   09/03/16 1005  Weight: 215 lb 1.6 oz (97.6 kg)    GENERAL:alert, no distress and comfortable SKIN: skin color, texture, turgor are normal, no rashes or significant lesions EYES: normal, Conjunctiva are pink and non-injected, sclera clear OROPHARYNX:no exudate, no erythema and lips, buccal mucosa, and tongue normal  NECK: supple, thyroid normal size, non-tender, without nodularity LYMPH:  no palpable lymphadenopathy in the cervical, axillary or inguinal LUNGS: clear to auscultation and percussion with normal breathing effort HEART: regular rate & rhythm and no murmurs and no lower extremity edema ABDOMEN:abdomen soft, non-tender and normal bowel sounds MUSCULOSKELETAL:no cyanosis of digits and no clubbing  NEURO: alert & oriented x 3 with fluent speech, Neuropathy grade 1-2 hands and feet EXTREMITIES: No lower extremity edema  LABORATORY  DATA:  I have reviewed the data as listed   Chemistry      Component Value Date/Time   NA 137 08/06/2016 0838   K 4.5 08/06/2016 0838   CL 103 01/09/2016 0525   CO2 23 08/06/2016 0838   BUN 10.5 08/06/2016 0838   CREATININE 1.3 08/06/2016 0838   GLU 107 03/14/2016      Component Value Date/Time   CALCIUM 9.2 08/06/2016 0838   ALKPHOS 83 08/06/2016 0838   AST 18 08/06/2016 0838   ALT 23 08/06/2016 0838   BILITOT 0.44 08/06/2016 0838       Lab Results  Component Value Date   WBC 8.8 08/06/2016   HGB 11.6 (L) 08/06/2016   HCT 33.6 (L) 08/06/2016   MCV 97.8 08/06/2016   PLT 144 08/06/2016   NEUTROABS 5.5 08/06/2016    ASSESSMENT & PLAN:  MDS (myelodysplastic syndrome) (HCC) MDS RAEB-1: failed decitabine and was treated with matched unrelated donor allogenic stem cell transplant with myeloablative conditioning using busulfan/cyclophosphamide.  Transplant date 06/17/8249  Complications after transplant: 1. Adrenal insufficiency requiring steroids 2. CMV reactivation: Required Ganciclovir 3. Esophagitis and duodenitis during induction  Current medications: Acyclovir, posaconazole,  Bactrim, magnesium 427m 5 times a day. Transfusion goals: [facility]. For hemoglobin less than 8 g: Irradiated/filtered/leuco-reduced 2. For platelets less than 10: Platelet transfusions that are also radiated and filtered and leucoreduced 3. For GVHD prophylaxis: Patient completed 4/4 doses of methotrexate and currently on tacrolimus started 04/15/2016 0.5 mg daily  [facility] Return to clinic in 2 months to see me. Patient understands that we are primarily here locally to support him. [facility] I spent 25 minutes talking to the patient of which more than half was spent in counseling and coordination of care.  No orders of the  defined types were placed in this encounter.  The patient has a good understanding of the overall plan. he agrees with it. he will call with any problems that may develop before the next visit here.   GRulon Eisenmenger MD 09/03/16

## 2016-09-03 NOTE — Assessment & Plan Note (Signed)
High risk MDS with excess blasts: Status post allogenic symptom transplant on 04/17/2016 with busulfan and Rituxan conditioning, day 90 bone marrow showed 98% chimerism (CMV negative)  CMV reactivation 05/28/2016 treated with ganciclovir undetectable as of 02/29/2018  GVHD prophylaxis with tacrolimus, Fostamatinib Other complications include adrenal insufficiency on hydrocortisone, GERD, hypercholesterolemia, esophagitis, hyponatremia, hypertension on atenolol 25 mg daily Current medications: Acyclovir, posaconazole and Bactrim for prophylaxis  Transfusion rules: Requires it radiated/filter/leuko-reduced blood products for hemoglobin less than 8 or platelets less than 10; Neupogen 300 g subcutaneous as needed for WBC less than 2000  Today's blood work review:  Return to clinic

## 2016-09-05 DIAGNOSIS — K219 Gastro-esophageal reflux disease without esophagitis: Secondary | ICD-10-CM | POA: Diagnosis not present

## 2016-09-05 DIAGNOSIS — R05 Cough: Secondary | ICD-10-CM | POA: Diagnosis not present

## 2016-09-05 DIAGNOSIS — J301 Allergic rhinitis due to pollen: Secondary | ICD-10-CM | POA: Diagnosis not present

## 2016-09-12 DIAGNOSIS — Z5181 Encounter for therapeutic drug level monitoring: Secondary | ICD-10-CM | POA: Diagnosis not present

## 2016-09-12 DIAGNOSIS — Z006 Encounter for examination for normal comparison and control in clinical research program: Secondary | ICD-10-CM | POA: Diagnosis not present

## 2016-09-12 DIAGNOSIS — E7800 Pure hypercholesterolemia, unspecified: Secondary | ICD-10-CM | POA: Diagnosis not present

## 2016-09-12 DIAGNOSIS — M1990 Unspecified osteoarthritis, unspecified site: Secondary | ICD-10-CM | POA: Diagnosis not present

## 2016-09-12 DIAGNOSIS — Z79899 Other long term (current) drug therapy: Secondary | ICD-10-CM | POA: Diagnosis not present

## 2016-09-12 DIAGNOSIS — I10 Essential (primary) hypertension: Secondary | ICD-10-CM | POA: Diagnosis not present

## 2016-09-12 DIAGNOSIS — D469 Myelodysplastic syndrome, unspecified: Secondary | ICD-10-CM | POA: Diagnosis not present

## 2016-09-12 DIAGNOSIS — R002 Palpitations: Secondary | ICD-10-CM | POA: Diagnosis not present

## 2016-09-12 DIAGNOSIS — K210 Gastro-esophageal reflux disease with esophagitis: Secondary | ICD-10-CM | POA: Diagnosis not present

## 2016-09-12 DIAGNOSIS — E2749 Other adrenocortical insufficiency: Secondary | ICD-10-CM | POA: Diagnosis not present

## 2016-09-12 DIAGNOSIS — C9200 Acute myeloblastic leukemia, not having achieved remission: Secondary | ICD-10-CM | POA: Diagnosis not present

## 2016-09-12 DIAGNOSIS — R05 Cough: Secondary | ICD-10-CM | POA: Diagnosis not present

## 2016-09-12 DIAGNOSIS — Z9484 Stem cells transplant status: Secondary | ICD-10-CM | POA: Diagnosis not present

## 2016-09-26 DIAGNOSIS — E2740 Unspecified adrenocortical insufficiency: Secondary | ICD-10-CM | POA: Diagnosis not present

## 2016-09-26 DIAGNOSIS — G629 Polyneuropathy, unspecified: Secondary | ICD-10-CM | POA: Diagnosis not present

## 2016-09-26 DIAGNOSIS — K210 Gastro-esophageal reflux disease with esophagitis: Secondary | ICD-10-CM | POA: Diagnosis not present

## 2016-09-26 DIAGNOSIS — R001 Bradycardia, unspecified: Secondary | ICD-10-CM | POA: Diagnosis not present

## 2016-09-26 DIAGNOSIS — E871 Hypo-osmolality and hyponatremia: Secondary | ICD-10-CM | POA: Diagnosis not present

## 2016-09-26 DIAGNOSIS — I10 Essential (primary) hypertension: Secondary | ICD-10-CM | POA: Diagnosis not present

## 2016-09-26 DIAGNOSIS — M1990 Unspecified osteoarthritis, unspecified site: Secondary | ICD-10-CM | POA: Diagnosis not present

## 2016-09-26 DIAGNOSIS — Z79899 Other long term (current) drug therapy: Secondary | ICD-10-CM | POA: Diagnosis not present

## 2016-09-26 DIAGNOSIS — R05 Cough: Secondary | ICD-10-CM | POA: Diagnosis not present

## 2016-09-26 DIAGNOSIS — R002 Palpitations: Secondary | ICD-10-CM | POA: Diagnosis not present

## 2016-09-26 DIAGNOSIS — R630 Anorexia: Secondary | ICD-10-CM | POA: Diagnosis not present

## 2016-09-26 DIAGNOSIS — D709 Neutropenia, unspecified: Secondary | ICD-10-CM | POA: Diagnosis not present

## 2016-09-26 DIAGNOSIS — Z006 Encounter for examination for normal comparison and control in clinical research program: Secondary | ICD-10-CM | POA: Diagnosis not present

## 2016-09-26 DIAGNOSIS — E7800 Pure hypercholesterolemia, unspecified: Secondary | ICD-10-CM | POA: Diagnosis not present

## 2016-09-26 DIAGNOSIS — Z9484 Stem cells transplant status: Secondary | ICD-10-CM | POA: Diagnosis not present

## 2016-09-26 DIAGNOSIS — Z5181 Encounter for therapeutic drug level monitoring: Secondary | ICD-10-CM | POA: Diagnosis not present

## 2016-09-26 DIAGNOSIS — R5383 Other fatigue: Secondary | ICD-10-CM | POA: Diagnosis not present

## 2016-09-26 DIAGNOSIS — D469 Myelodysplastic syndrome, unspecified: Secondary | ICD-10-CM | POA: Diagnosis not present

## 2016-09-26 DIAGNOSIS — L989 Disorder of the skin and subcutaneous tissue, unspecified: Secondary | ICD-10-CM | POA: Diagnosis not present

## 2016-09-27 ENCOUNTER — Encounter: Payer: Self-pay | Admitting: Family Medicine

## 2016-10-10 DIAGNOSIS — R21 Rash and other nonspecific skin eruption: Secondary | ICD-10-CM | POA: Diagnosis not present

## 2016-10-10 DIAGNOSIS — D469 Myelodysplastic syndrome, unspecified: Secondary | ICD-10-CM | POA: Diagnosis not present

## 2016-10-10 DIAGNOSIS — C9200 Acute myeloblastic leukemia, not having achieved remission: Secondary | ICD-10-CM | POA: Diagnosis not present

## 2016-10-10 DIAGNOSIS — Z8619 Personal history of other infectious and parasitic diseases: Secondary | ICD-10-CM | POA: Diagnosis not present

## 2016-10-10 DIAGNOSIS — R05 Cough: Secondary | ICD-10-CM | POA: Diagnosis not present

## 2016-10-10 DIAGNOSIS — Z48298 Encounter for aftercare following other organ transplant: Secondary | ICD-10-CM | POA: Diagnosis not present

## 2016-10-10 DIAGNOSIS — Z79899 Other long term (current) drug therapy: Secondary | ICD-10-CM | POA: Diagnosis not present

## 2016-10-10 DIAGNOSIS — B259 Cytomegaloviral disease, unspecified: Secondary | ICD-10-CM | POA: Diagnosis not present

## 2016-10-10 DIAGNOSIS — E2740 Unspecified adrenocortical insufficiency: Secondary | ICD-10-CM | POA: Diagnosis not present

## 2016-10-10 DIAGNOSIS — Z5181 Encounter for therapeutic drug level monitoring: Secondary | ICD-10-CM | POA: Diagnosis not present

## 2016-10-10 DIAGNOSIS — Z9484 Stem cells transplant status: Secondary | ICD-10-CM | POA: Diagnosis not present

## 2016-10-10 DIAGNOSIS — I10 Essential (primary) hypertension: Secondary | ICD-10-CM | POA: Diagnosis not present

## 2016-10-10 DIAGNOSIS — Z006 Encounter for examination for normal comparison and control in clinical research program: Secondary | ICD-10-CM | POA: Diagnosis not present

## 2016-10-11 ENCOUNTER — Encounter: Payer: Self-pay | Admitting: Family Medicine

## 2016-10-12 DIAGNOSIS — I878 Other specified disorders of veins: Secondary | ICD-10-CM | POA: Diagnosis not present

## 2016-10-12 DIAGNOSIS — L821 Other seborrheic keratosis: Secondary | ICD-10-CM | POA: Diagnosis not present

## 2016-10-12 DIAGNOSIS — L219 Seborrheic dermatitis, unspecified: Secondary | ICD-10-CM | POA: Diagnosis not present

## 2016-10-24 ENCOUNTER — Encounter: Payer: Self-pay | Admitting: Hematology and Oncology

## 2016-10-29 ENCOUNTER — Ambulatory Visit: Payer: Medicare Other | Admitting: Hematology and Oncology

## 2016-10-31 DIAGNOSIS — R05 Cough: Secondary | ICD-10-CM | POA: Diagnosis not present

## 2016-10-31 DIAGNOSIS — M1990 Unspecified osteoarthritis, unspecified site: Secondary | ICD-10-CM | POA: Diagnosis not present

## 2016-10-31 DIAGNOSIS — I82443 Acute embolism and thrombosis of tibial vein, bilateral: Secondary | ICD-10-CM | POA: Diagnosis not present

## 2016-10-31 DIAGNOSIS — E2749 Other adrenocortical insufficiency: Secondary | ICD-10-CM | POA: Diagnosis not present

## 2016-10-31 DIAGNOSIS — Z8619 Personal history of other infectious and parasitic diseases: Secondary | ICD-10-CM | POA: Diagnosis not present

## 2016-10-31 DIAGNOSIS — I82441 Acute embolism and thrombosis of right tibial vein: Secondary | ICD-10-CM | POA: Diagnosis not present

## 2016-10-31 DIAGNOSIS — Z5181 Encounter for therapeutic drug level monitoring: Secondary | ICD-10-CM | POA: Diagnosis not present

## 2016-10-31 DIAGNOSIS — I10 Essential (primary) hypertension: Secondary | ICD-10-CM | POA: Diagnosis not present

## 2016-10-31 DIAGNOSIS — D469 Myelodysplastic syndrome, unspecified: Secondary | ICD-10-CM | POA: Diagnosis not present

## 2016-10-31 DIAGNOSIS — R600 Localized edema: Secondary | ICD-10-CM | POA: Diagnosis not present

## 2016-10-31 DIAGNOSIS — K210 Gastro-esophageal reflux disease with esophagitis: Secondary | ICD-10-CM | POA: Diagnosis not present

## 2016-10-31 DIAGNOSIS — E7800 Pure hypercholesterolemia, unspecified: Secondary | ICD-10-CM | POA: Diagnosis not present

## 2016-10-31 DIAGNOSIS — R002 Palpitations: Secondary | ICD-10-CM | POA: Diagnosis not present

## 2016-10-31 DIAGNOSIS — Z9484 Stem cells transplant status: Secondary | ICD-10-CM | POA: Diagnosis not present

## 2016-10-31 DIAGNOSIS — Z006 Encounter for examination for normal comparison and control in clinical research program: Secondary | ICD-10-CM | POA: Diagnosis not present

## 2016-10-31 LAB — CBC AND DIFFERENTIAL
HCT: 36 % — AB (ref 41–53)
Hemoglobin: 12.3 g/dL — AB (ref 13.5–17.5)
Neutrophils Absolute: 5 /uL
PLATELETS: 200 10*3/uL (ref 150–399)
WBC: 6.9 10*3/mL

## 2016-10-31 LAB — BASIC METABOLIC PANEL
BUN: 23 mg/dL — AB (ref 4–21)
Creatinine: 1.1 mg/dL (ref 0.6–1.3)
GLUCOSE: 138 mg/dL
Potassium: 5 mmol/L (ref 3.4–5.3)
Sodium: 135 mmol/L — AB (ref 137–147)

## 2016-10-31 LAB — HEPATIC FUNCTION PANEL
ALT: 77 U/L — AB (ref 10–40)
AST: 61 U/L — AB (ref 14–40)
Alkaline Phosphatase: 119 U/L (ref 25–125)
Bilirubin, Total: 1.8 mg/dL

## 2016-11-02 ENCOUNTER — Encounter: Payer: Self-pay | Admitting: Family Medicine

## 2016-11-07 DIAGNOSIS — Z9481 Bone marrow transplant status: Secondary | ICD-10-CM | POA: Diagnosis not present

## 2016-11-07 DIAGNOSIS — I82A12 Acute embolism and thrombosis of left axillary vein: Secondary | ICD-10-CM | POA: Diagnosis not present

## 2016-11-07 DIAGNOSIS — R600 Localized edema: Secondary | ICD-10-CM | POA: Diagnosis not present

## 2016-11-07 DIAGNOSIS — D469 Myelodysplastic syndrome, unspecified: Secondary | ICD-10-CM | POA: Diagnosis not present

## 2016-11-07 DIAGNOSIS — I824Z1 Acute embolism and thrombosis of unspecified deep veins of right distal lower extremity: Secondary | ICD-10-CM | POA: Diagnosis not present

## 2016-11-14 DIAGNOSIS — Z5181 Encounter for therapeutic drug level monitoring: Secondary | ICD-10-CM | POA: Diagnosis not present

## 2016-11-14 DIAGNOSIS — Z9484 Stem cells transplant status: Secondary | ICD-10-CM | POA: Diagnosis not present

## 2016-11-14 DIAGNOSIS — R05 Cough: Secondary | ICD-10-CM | POA: Diagnosis not present

## 2016-11-14 DIAGNOSIS — D469 Myelodysplastic syndrome, unspecified: Secondary | ICD-10-CM | POA: Diagnosis not present

## 2016-11-28 DIAGNOSIS — R002 Palpitations: Secondary | ICD-10-CM | POA: Diagnosis not present

## 2016-11-28 DIAGNOSIS — I82441 Acute embolism and thrombosis of right tibial vein: Secondary | ICD-10-CM | POA: Diagnosis not present

## 2016-11-28 DIAGNOSIS — E2749 Other adrenocortical insufficiency: Secondary | ICD-10-CM | POA: Diagnosis not present

## 2016-11-28 DIAGNOSIS — E7800 Pure hypercholesterolemia, unspecified: Secondary | ICD-10-CM | POA: Diagnosis not present

## 2016-11-28 DIAGNOSIS — R5381 Other malaise: Secondary | ICD-10-CM | POA: Diagnosis not present

## 2016-11-28 DIAGNOSIS — Z9221 Personal history of antineoplastic chemotherapy: Secondary | ICD-10-CM | POA: Diagnosis not present

## 2016-11-28 DIAGNOSIS — Z006 Encounter for examination for normal comparison and control in clinical research program: Secondary | ICD-10-CM | POA: Diagnosis not present

## 2016-11-28 DIAGNOSIS — T865 Complications of stem cell transplant: Secondary | ICD-10-CM | POA: Diagnosis not present

## 2016-11-28 DIAGNOSIS — Z79899 Other long term (current) drug therapy: Secondary | ICD-10-CM | POA: Diagnosis not present

## 2016-11-28 DIAGNOSIS — R05 Cough: Secondary | ICD-10-CM | POA: Diagnosis not present

## 2016-11-28 DIAGNOSIS — T451X5A Adverse effect of antineoplastic and immunosuppressive drugs, initial encounter: Secondary | ICD-10-CM | POA: Diagnosis not present

## 2016-11-28 DIAGNOSIS — D469 Myelodysplastic syndrome, unspecified: Secondary | ICD-10-CM | POA: Diagnosis not present

## 2016-11-28 DIAGNOSIS — Z298 Encounter for other specified prophylactic measures: Secondary | ICD-10-CM | POA: Diagnosis not present

## 2016-11-28 DIAGNOSIS — D89813 Graft-versus-host disease, unspecified: Secondary | ICD-10-CM | POA: Diagnosis not present

## 2016-11-28 DIAGNOSIS — L54 Erythema in diseases classified elsewhere: Secondary | ICD-10-CM | POA: Diagnosis not present

## 2016-11-28 DIAGNOSIS — R5383 Other fatigue: Secondary | ICD-10-CM | POA: Diagnosis not present

## 2016-11-28 DIAGNOSIS — R001 Bradycardia, unspecified: Secondary | ICD-10-CM | POA: Diagnosis not present

## 2016-11-28 DIAGNOSIS — K5900 Constipation, unspecified: Secondary | ICD-10-CM | POA: Diagnosis not present

## 2016-11-28 DIAGNOSIS — Z5181 Encounter for therapeutic drug level monitoring: Secondary | ICD-10-CM | POA: Diagnosis not present

## 2016-11-28 DIAGNOSIS — Z9484 Stem cells transplant status: Secondary | ICD-10-CM | POA: Diagnosis not present

## 2016-11-28 DIAGNOSIS — Z9189 Other specified personal risk factors, not elsewhere classified: Secondary | ICD-10-CM | POA: Diagnosis not present

## 2016-11-28 DIAGNOSIS — I10 Essential (primary) hypertension: Secondary | ICD-10-CM | POA: Diagnosis not present

## 2016-11-28 DIAGNOSIS — C9200 Acute myeloblastic leukemia, not having achieved remission: Secondary | ICD-10-CM | POA: Diagnosis not present

## 2016-12-05 DIAGNOSIS — D469 Myelodysplastic syndrome, unspecified: Secondary | ICD-10-CM | POA: Diagnosis not present

## 2016-12-05 DIAGNOSIS — I82411 Acute embolism and thrombosis of right femoral vein: Secondary | ICD-10-CM | POA: Diagnosis not present

## 2016-12-05 DIAGNOSIS — Z9484 Stem cells transplant status: Secondary | ICD-10-CM | POA: Diagnosis not present

## 2016-12-05 DIAGNOSIS — R05 Cough: Secondary | ICD-10-CM | POA: Diagnosis not present

## 2016-12-05 DIAGNOSIS — Z5181 Encounter for therapeutic drug level monitoring: Secondary | ICD-10-CM | POA: Diagnosis not present

## 2016-12-19 DIAGNOSIS — Z006 Encounter for examination for normal comparison and control in clinical research program: Secondary | ICD-10-CM | POA: Diagnosis not present

## 2016-12-19 DIAGNOSIS — D469 Myelodysplastic syndrome, unspecified: Secondary | ICD-10-CM | POA: Diagnosis not present

## 2016-12-19 DIAGNOSIS — Z5181 Encounter for therapeutic drug level monitoring: Secondary | ICD-10-CM | POA: Diagnosis not present

## 2016-12-19 DIAGNOSIS — H2513 Age-related nuclear cataract, bilateral: Secondary | ICD-10-CM | POA: Diagnosis not present

## 2016-12-19 DIAGNOSIS — H04123 Dry eye syndrome of bilateral lacrimal glands: Secondary | ICD-10-CM | POA: Diagnosis not present

## 2016-12-19 DIAGNOSIS — E2740 Unspecified adrenocortical insufficiency: Secondary | ICD-10-CM | POA: Diagnosis not present

## 2016-12-19 DIAGNOSIS — R05 Cough: Secondary | ICD-10-CM | POA: Diagnosis not present

## 2016-12-19 DIAGNOSIS — H43391 Other vitreous opacities, right eye: Secondary | ICD-10-CM | POA: Diagnosis not present

## 2016-12-19 DIAGNOSIS — I10 Essential (primary) hypertension: Secondary | ICD-10-CM | POA: Diagnosis not present

## 2016-12-19 DIAGNOSIS — Z9484 Stem cells transplant status: Secondary | ICD-10-CM | POA: Diagnosis not present

## 2016-12-19 DIAGNOSIS — I82409 Acute embolism and thrombosis of unspecified deep veins of unspecified lower extremity: Secondary | ICD-10-CM | POA: Diagnosis not present

## 2016-12-19 DIAGNOSIS — D89813 Graft-versus-host disease, unspecified: Secondary | ICD-10-CM | POA: Diagnosis not present

## 2017-01-02 DIAGNOSIS — Z9484 Stem cells transplant status: Secondary | ICD-10-CM | POA: Diagnosis not present

## 2017-01-02 DIAGNOSIS — E7800 Pure hypercholesterolemia, unspecified: Secondary | ICD-10-CM | POA: Diagnosis not present

## 2017-01-02 DIAGNOSIS — I10 Essential (primary) hypertension: Secondary | ICD-10-CM | POA: Diagnosis not present

## 2017-01-02 DIAGNOSIS — D469 Myelodysplastic syndrome, unspecified: Secondary | ICD-10-CM | POA: Diagnosis not present

## 2017-01-02 DIAGNOSIS — R21 Rash and other nonspecific skin eruption: Secondary | ICD-10-CM | POA: Diagnosis not present

## 2017-01-02 DIAGNOSIS — K210 Gastro-esophageal reflux disease with esophagitis: Secondary | ICD-10-CM | POA: Diagnosis not present

## 2017-01-02 DIAGNOSIS — D89813 Graft-versus-host disease, unspecified: Secondary | ICD-10-CM | POA: Diagnosis not present

## 2017-01-02 DIAGNOSIS — Z5181 Encounter for therapeutic drug level monitoring: Secondary | ICD-10-CM | POA: Diagnosis not present

## 2017-01-02 DIAGNOSIS — E871 Hypo-osmolality and hyponatremia: Secondary | ICD-10-CM | POA: Diagnosis not present

## 2017-01-02 DIAGNOSIS — R002 Palpitations: Secondary | ICD-10-CM | POA: Diagnosis not present

## 2017-01-02 DIAGNOSIS — M1990 Unspecified osteoarthritis, unspecified site: Secondary | ICD-10-CM | POA: Diagnosis not present

## 2017-01-02 DIAGNOSIS — R112 Nausea with vomiting, unspecified: Secondary | ICD-10-CM | POA: Diagnosis not present

## 2017-01-02 DIAGNOSIS — E2740 Unspecified adrenocortical insufficiency: Secondary | ICD-10-CM | POA: Diagnosis not present

## 2017-01-02 DIAGNOSIS — Z7901 Long term (current) use of anticoagulants: Secondary | ICD-10-CM | POA: Diagnosis not present

## 2017-01-02 DIAGNOSIS — R600 Localized edema: Secondary | ICD-10-CM | POA: Diagnosis not present

## 2017-01-02 DIAGNOSIS — I82412 Acute embolism and thrombosis of left femoral vein: Secondary | ICD-10-CM | POA: Diagnosis not present

## 2017-01-02 DIAGNOSIS — R05 Cough: Secondary | ICD-10-CM | POA: Diagnosis not present

## 2017-01-16 DIAGNOSIS — I82441 Acute embolism and thrombosis of right tibial vein: Secondary | ICD-10-CM | POA: Diagnosis not present

## 2017-01-16 DIAGNOSIS — K219 Gastro-esophageal reflux disease without esophagitis: Secondary | ICD-10-CM | POA: Diagnosis not present

## 2017-01-16 DIAGNOSIS — R002 Palpitations: Secondary | ICD-10-CM | POA: Diagnosis not present

## 2017-01-16 DIAGNOSIS — Z9481 Bone marrow transplant status: Secondary | ICD-10-CM | POA: Diagnosis not present

## 2017-01-16 DIAGNOSIS — M1990 Unspecified osteoarthritis, unspecified site: Secondary | ICD-10-CM | POA: Diagnosis not present

## 2017-01-16 DIAGNOSIS — E2740 Unspecified adrenocortical insufficiency: Secondary | ICD-10-CM | POA: Diagnosis not present

## 2017-01-16 DIAGNOSIS — Z5181 Encounter for therapeutic drug level monitoring: Secondary | ICD-10-CM | POA: Diagnosis not present

## 2017-01-16 DIAGNOSIS — Z006 Encounter for examination for normal comparison and control in clinical research program: Secondary | ICD-10-CM | POA: Diagnosis not present

## 2017-01-16 DIAGNOSIS — I10 Essential (primary) hypertension: Secondary | ICD-10-CM | POA: Diagnosis not present

## 2017-01-16 DIAGNOSIS — Z7901 Long term (current) use of anticoagulants: Secondary | ICD-10-CM | POA: Diagnosis not present

## 2017-01-16 DIAGNOSIS — T86898 Other complications of other transplanted tissue: Secondary | ICD-10-CM | POA: Diagnosis not present

## 2017-01-16 DIAGNOSIS — C9200 Acute myeloblastic leukemia, not having achieved remission: Secondary | ICD-10-CM | POA: Diagnosis not present

## 2017-01-16 DIAGNOSIS — D469 Myelodysplastic syndrome, unspecified: Secondary | ICD-10-CM | POA: Diagnosis not present

## 2017-01-16 DIAGNOSIS — I824Z1 Acute embolism and thrombosis of unspecified deep veins of right distal lower extremity: Secondary | ICD-10-CM | POA: Diagnosis not present

## 2017-01-16 DIAGNOSIS — I82412 Acute embolism and thrombosis of left femoral vein: Secondary | ICD-10-CM | POA: Diagnosis not present

## 2017-01-16 DIAGNOSIS — Z9484 Stem cells transplant status: Secondary | ICD-10-CM | POA: Diagnosis not present

## 2017-01-16 DIAGNOSIS — E7800 Pure hypercholesterolemia, unspecified: Secondary | ICD-10-CM | POA: Diagnosis not present

## 2017-01-16 DIAGNOSIS — H04123 Dry eye syndrome of bilateral lacrimal glands: Secondary | ICD-10-CM | POA: Diagnosis not present

## 2017-01-16 DIAGNOSIS — D89813 Graft-versus-host disease, unspecified: Secondary | ICD-10-CM | POA: Diagnosis not present

## 2017-01-17 ENCOUNTER — Other Ambulatory Visit: Payer: Self-pay

## 2017-01-17 DIAGNOSIS — D469 Myelodysplastic syndrome, unspecified: Secondary | ICD-10-CM

## 2017-01-17 NOTE — Progress Notes (Signed)
Pt called to request for lab work in between his clinical trial at [facility]. Pt states that his Dr at [facility] suggest that he gets his labs here at the cancer center. Scheduled pt for his labs and will forward results to his MD at [facility]. Pt very appreciative and confirmed time/date of lab appt on 8/6

## 2017-01-28 ENCOUNTER — Other Ambulatory Visit (HOSPITAL_BASED_OUTPATIENT_CLINIC_OR_DEPARTMENT_OTHER): Payer: Medicare Other

## 2017-01-28 ENCOUNTER — Telehealth: Payer: Self-pay

## 2017-01-28 DIAGNOSIS — D469 Myelodysplastic syndrome, unspecified: Secondary | ICD-10-CM

## 2017-01-28 DIAGNOSIS — D4621 Refractory anemia with excess of blasts 1: Secondary | ICD-10-CM

## 2017-01-28 LAB — CBC WITH DIFFERENTIAL/PLATELET
BASO%: 0.5 % (ref 0.0–2.0)
Basophils Absolute: 0 10*3/uL (ref 0.0–0.1)
EOS ABS: 0 10*3/uL (ref 0.0–0.5)
EOS%: 0.8 % (ref 0.0–7.0)
HCT: 38 % — ABNORMAL LOW (ref 38.4–49.9)
HEMOGLOBIN: 12.9 g/dL — AB (ref 13.0–17.1)
LYMPH%: 17.4 % (ref 14.0–49.0)
MCH: 34.2 pg — ABNORMAL HIGH (ref 27.2–33.4)
MCHC: 33.9 g/dL (ref 32.0–36.0)
MCV: 100.9 fL — AB (ref 79.3–98.0)
MONO#: 0.4 10*3/uL (ref 0.1–0.9)
MONO%: 6.3 % (ref 0.0–14.0)
NEUT%: 75 % (ref 39.0–75.0)
NEUTROS ABS: 4.2 10*3/uL (ref 1.5–6.5)
PLATELETS: 191 10*3/uL (ref 140–400)
RBC: 3.77 10*6/uL — ABNORMAL LOW (ref 4.20–5.82)
RDW: 16 % — ABNORMAL HIGH (ref 11.0–14.6)
WBC: 5.6 10*3/uL (ref 4.0–10.3)
lymph#: 1 10*3/uL (ref 0.9–3.3)

## 2017-01-28 LAB — COMPREHENSIVE METABOLIC PANEL
ALBUMIN: 3.1 g/dL — AB (ref 3.5–5.0)
ALK PHOS: 395 U/L — AB (ref 40–150)
ALT: 625 U/L (ref 0–55)
ANION GAP: 8 meq/L (ref 3–11)
AST: 126 U/L — AB (ref 5–34)
BUN: 15.3 mg/dL (ref 7.0–26.0)
CHLORIDE: 104 meq/L (ref 98–109)
CO2: 25 mEq/L (ref 22–29)
Calcium: 9.3 mg/dL (ref 8.4–10.4)
Creatinine: 1.2 mg/dL (ref 0.7–1.3)
EGFR: 60 mL/min/{1.73_m2} — AB (ref >90)
Glucose: 168 mg/dl — ABNORMAL HIGH (ref 70–140)
POTASSIUM: 3.7 meq/L (ref 3.5–5.1)
Sodium: 137 mEq/L (ref 136–145)
Total Bilirubin: 1.67 mg/dL — ABNORMAL HIGH (ref 0.20–1.20)
Total Protein: 6.4 g/dL (ref 6.4–8.3)

## 2017-01-28 LAB — TECHNOLOGIST REVIEW

## 2017-01-28 LAB — MAGNESIUM: Magnesium: 2.1 mg/dl (ref 1.5–2.5)

## 2017-01-28 NOTE — Progress Notes (Unsigned)
Faxed lab results to Dr.Sarantopoulos office. Critical ALT results given to [place] for review and is aware.

## 2017-01-28 NOTE — Telephone Encounter (Signed)
-----   Message from [place], RN sent at 01/17/2017 11:52 AM EDT ----- Labs to be sent to [place]. Due for cbc and cmet today.

## 2017-01-30 DIAGNOSIS — R5383 Other fatigue: Secondary | ICD-10-CM | POA: Diagnosis not present

## 2017-01-30 DIAGNOSIS — I82441 Acute embolism and thrombosis of right tibial vein: Secondary | ICD-10-CM | POA: Diagnosis not present

## 2017-01-30 DIAGNOSIS — Z9484 Stem cells transplant status: Secondary | ICD-10-CM | POA: Diagnosis not present

## 2017-01-30 DIAGNOSIS — E2749 Other adrenocortical insufficiency: Secondary | ICD-10-CM | POA: Diagnosis not present

## 2017-01-30 DIAGNOSIS — D469 Myelodysplastic syndrome, unspecified: Secondary | ICD-10-CM | POA: Diagnosis not present

## 2017-01-30 DIAGNOSIS — K1379 Other lesions of oral mucosa: Secondary | ICD-10-CM | POA: Diagnosis not present

## 2017-01-30 DIAGNOSIS — I10 Essential (primary) hypertension: Secondary | ICD-10-CM | POA: Diagnosis not present

## 2017-01-30 DIAGNOSIS — C9200 Acute myeloblastic leukemia, not having achieved remission: Secondary | ICD-10-CM | POA: Diagnosis not present

## 2017-01-30 DIAGNOSIS — D89813 Graft-versus-host disease, unspecified: Secondary | ICD-10-CM | POA: Diagnosis not present

## 2017-01-30 DIAGNOSIS — T865 Complications of stem cell transplant: Secondary | ICD-10-CM | POA: Diagnosis not present

## 2017-01-30 DIAGNOSIS — K7689 Other specified diseases of liver: Secondary | ICD-10-CM | POA: Diagnosis not present

## 2017-01-30 DIAGNOSIS — R432 Parageusia: Secondary | ICD-10-CM | POA: Diagnosis not present

## 2017-01-30 LAB — TACROLIMUS LEVEL: Tacrolimus (FK506), Blood: NOT DETECTED ng/mL (ref 2.0–20.0)

## 2017-02-01 DIAGNOSIS — D89811 Chronic graft-versus-host disease: Secondary | ICD-10-CM | POA: Diagnosis not present

## 2017-02-04 ENCOUNTER — Telehealth: Payer: Self-pay

## 2017-02-04 DIAGNOSIS — I10 Essential (primary) hypertension: Secondary | ICD-10-CM | POA: Diagnosis not present

## 2017-02-04 DIAGNOSIS — K0510 Chronic gingivitis, plaque induced: Secondary | ICD-10-CM | POA: Diagnosis not present

## 2017-02-04 DIAGNOSIS — R002 Palpitations: Secondary | ICD-10-CM | POA: Diagnosis not present

## 2017-02-04 DIAGNOSIS — D89811 Chronic graft-versus-host disease: Secondary | ICD-10-CM | POA: Diagnosis not present

## 2017-02-04 DIAGNOSIS — C9200 Acute myeloblastic leukemia, not having achieved remission: Secondary | ICD-10-CM | POA: Diagnosis not present

## 2017-02-04 DIAGNOSIS — E2749 Other adrenocortical insufficiency: Secondary | ICD-10-CM | POA: Diagnosis not present

## 2017-02-04 DIAGNOSIS — Z9484 Stem cells transplant status: Secondary | ICD-10-CM | POA: Diagnosis not present

## 2017-02-04 DIAGNOSIS — Z7901 Long term (current) use of anticoagulants: Secondary | ICD-10-CM | POA: Diagnosis not present

## 2017-02-04 DIAGNOSIS — D89813 Graft-versus-host disease, unspecified: Secondary | ICD-10-CM | POA: Diagnosis not present

## 2017-02-04 DIAGNOSIS — D469 Myelodysplastic syndrome, unspecified: Secondary | ICD-10-CM

## 2017-02-04 DIAGNOSIS — I82541 Chronic embolism and thrombosis of right tibial vein: Secondary | ICD-10-CM | POA: Diagnosis not present

## 2017-02-04 DIAGNOSIS — T8601 Bone marrow transplant rejection: Secondary | ICD-10-CM | POA: Diagnosis not present

## 2017-02-04 LAB — HEPATIC FUNCTION PANEL
ALK PHOS: 408 — AB (ref 25–125)
ALK PHOS: 160 — AB (ref 25–125)
ALT: 1 — AB (ref 10–40)
ALT: 55 — AB (ref 10–40)
AST: 167 — AB (ref 14–40)
AST: 40 (ref 14–40)
BILIRUBIN, TOTAL: 1.4
Bilirubin, Direct: 0.4 (ref 0.01–0.4)
Bilirubin, Total: 1.2

## 2017-02-04 NOTE — Telephone Encounter (Signed)
Pt called that MD at [place] wanted him to have the same labs that were done last Monday done this Friday. Placed new orders to be able to "cc" Dr Jana Hakim for results to be read.  inbasket sent for appt. S/w pt to expect call from scheduler.

## 2017-02-05 ENCOUNTER — Telehealth: Payer: Self-pay | Admitting: Hematology and Oncology

## 2017-02-05 NOTE — Telephone Encounter (Signed)
lvm to inform pt of lab appt 8/17 at 11 am per sch msg

## 2017-02-06 ENCOUNTER — Encounter: Payer: Self-pay | Admitting: Family Medicine

## 2017-02-08 ENCOUNTER — Other Ambulatory Visit (HOSPITAL_BASED_OUTPATIENT_CLINIC_OR_DEPARTMENT_OTHER): Payer: Medicare Other

## 2017-02-08 ENCOUNTER — Telehealth: Payer: Self-pay | Admitting: *Deleted

## 2017-02-08 DIAGNOSIS — D4621 Refractory anemia with excess of blasts 1: Secondary | ICD-10-CM

## 2017-02-08 DIAGNOSIS — D469 Myelodysplastic syndrome, unspecified: Secondary | ICD-10-CM | POA: Diagnosis not present

## 2017-02-08 LAB — MAGNESIUM: Magnesium: 1.9 mg/dl (ref 1.5–2.5)

## 2017-02-08 LAB — CBC WITH DIFFERENTIAL/PLATELET
BASO%: 0.4 % (ref 0.0–2.0)
Basophils Absolute: 0 10*3/uL (ref 0.0–0.1)
EOS ABS: 0 10*3/uL (ref 0.0–0.5)
EOS%: 0 % (ref 0.0–7.0)
HCT: 38.1 % — ABNORMAL LOW (ref 38.4–49.9)
HEMOGLOBIN: 13 g/dL (ref 13.0–17.1)
LYMPH#: 0.8 10*3/uL — AB (ref 0.9–3.3)
LYMPH%: 11.1 % — ABNORMAL LOW (ref 14.0–49.0)
MCH: 33.5 pg — ABNORMAL HIGH (ref 27.2–33.4)
MCHC: 34.1 g/dL (ref 32.0–36.0)
MCV: 98.3 fL — AB (ref 79.3–98.0)
MONO#: 0.3 10*3/uL (ref 0.1–0.9)
MONO%: 3.7 % (ref 0.0–14.0)
NEUT%: 84.8 % — ABNORMAL HIGH (ref 39.0–75.0)
NEUTROS ABS: 6.5 10*3/uL (ref 1.5–6.5)
Platelets: 191 10*3/uL (ref 140–400)
RBC: 3.88 10*6/uL — ABNORMAL LOW (ref 4.20–5.82)
RDW: 16.6 % — AB (ref 11.0–14.6)
WBC: 7.6 10*3/uL (ref 4.0–10.3)

## 2017-02-08 LAB — COMPREHENSIVE METABOLIC PANEL
ALBUMIN: 3 g/dL — AB (ref 3.5–5.0)
ALK PHOS: 322 U/L — AB (ref 40–150)
ALT: 325 U/L (ref 0–55)
AST: 57 U/L — ABNORMAL HIGH (ref 5–34)
Anion Gap: 9 mEq/L (ref 3–11)
BILIRUBIN TOTAL: 1.44 mg/dL — AB (ref 0.20–1.20)
BUN: 21.7 mg/dL (ref 7.0–26.0)
CO2: 24 mEq/L (ref 22–29)
Calcium: 9.4 mg/dL (ref 8.4–10.4)
Chloride: 101 mEq/L (ref 98–109)
Creatinine: 1.2 mg/dL (ref 0.7–1.3)
EGFR: 62 mL/min/{1.73_m2} — AB (ref >90)
GLUCOSE: 246 mg/dL — AB (ref 70–140)
Potassium: 4.5 mEq/L (ref 3.5–5.1)
SODIUM: 134 meq/L — AB (ref 136–145)
TOTAL PROTEIN: 6.1 g/dL — AB (ref 6.4–8.3)

## 2017-02-08 LAB — TECHNOLOGIST REVIEW: Technologist Review: 2

## 2017-02-08 NOTE — Telephone Encounter (Signed)
Lab results obtained and faxed to Dr [name] at [facility] per request.  Pt made aware of results including elevated glucose of 256- note pt is on steroids and was not fasting per blood draw.

## 2017-02-10 LAB — TACROLIMUS LEVEL: Tacrolimus (FK506), Blood: 3.8 ng/mL (ref 2.0–20.0)

## 2017-02-13 ENCOUNTER — Other Ambulatory Visit: Payer: Self-pay

## 2017-02-13 ENCOUNTER — Telehealth: Payer: Self-pay

## 2017-02-13 DIAGNOSIS — E2749 Other adrenocortical insufficiency: Secondary | ICD-10-CM | POA: Diagnosis not present

## 2017-02-13 DIAGNOSIS — D469 Myelodysplastic syndrome, unspecified: Secondary | ICD-10-CM | POA: Diagnosis not present

## 2017-02-13 DIAGNOSIS — D89813 Graft-versus-host disease, unspecified: Secondary | ICD-10-CM | POA: Diagnosis not present

## 2017-02-13 DIAGNOSIS — T8601 Bone marrow transplant rejection: Secondary | ICD-10-CM | POA: Diagnosis not present

## 2017-02-13 DIAGNOSIS — T865 Complications of stem cell transplant: Secondary | ICD-10-CM | POA: Diagnosis not present

## 2017-02-13 DIAGNOSIS — Z86718 Personal history of other venous thrombosis and embolism: Secondary | ICD-10-CM | POA: Diagnosis not present

## 2017-02-13 DIAGNOSIS — R002 Palpitations: Secondary | ICD-10-CM | POA: Diagnosis not present

## 2017-02-13 DIAGNOSIS — E1165 Type 2 diabetes mellitus with hyperglycemia: Secondary | ICD-10-CM | POA: Diagnosis not present

## 2017-02-13 DIAGNOSIS — Z7901 Long term (current) use of anticoagulants: Secondary | ICD-10-CM | POA: Diagnosis not present

## 2017-02-13 DIAGNOSIS — Z79899 Other long term (current) drug therapy: Secondary | ICD-10-CM | POA: Diagnosis not present

## 2017-02-13 DIAGNOSIS — R112 Nausea with vomiting, unspecified: Secondary | ICD-10-CM | POA: Diagnosis not present

## 2017-02-13 DIAGNOSIS — C9200 Acute myeloblastic leukemia, not having achieved remission: Secondary | ICD-10-CM | POA: Diagnosis not present

## 2017-02-13 DIAGNOSIS — D89811 Chronic graft-versus-host disease: Secondary | ICD-10-CM | POA: Diagnosis not present

## 2017-02-13 DIAGNOSIS — Z9484 Stem cells transplant status: Secondary | ICD-10-CM | POA: Diagnosis not present

## 2017-02-13 DIAGNOSIS — Z5181 Encounter for therapeutic drug level monitoring: Secondary | ICD-10-CM | POA: Diagnosis not present

## 2017-02-13 DIAGNOSIS — I10 Essential (primary) hypertension: Secondary | ICD-10-CM | POA: Diagnosis not present

## 2017-02-13 NOTE — Telephone Encounter (Signed)
Pt called stating that the clinical trial at [place] has requested he lab work done "in 3 days if possible".  Pt informed that our lab is not open on Saturdays.  Pt requested to schedule a lab  Appointment for Monday.  Appointment made.  Pt aware of appt time.

## 2017-02-18 ENCOUNTER — Other Ambulatory Visit: Payer: BC Managed Care – PPO

## 2017-02-20 ENCOUNTER — Other Ambulatory Visit (HOSPITAL_BASED_OUTPATIENT_CLINIC_OR_DEPARTMENT_OTHER): Payer: Medicare Other

## 2017-02-20 DIAGNOSIS — D4621 Refractory anemia with excess of blasts 1: Secondary | ICD-10-CM

## 2017-02-20 DIAGNOSIS — D469 Myelodysplastic syndrome, unspecified: Secondary | ICD-10-CM

## 2017-02-20 LAB — CBC WITH DIFFERENTIAL/PLATELET
BASO%: 0.2 % (ref 0.0–2.0)
BASOS ABS: 0 10*3/uL (ref 0.0–0.1)
EOS%: 0 % (ref 0.0–7.0)
Eosinophils Absolute: 0 10*3/uL (ref 0.0–0.5)
HCT: 37.7 % — ABNORMAL LOW (ref 38.4–49.9)
HEMOGLOBIN: 12.9 g/dL — AB (ref 13.0–17.1)
LYMPH%: 29.2 % (ref 14.0–49.0)
MCH: 33 pg (ref 27.2–33.4)
MCHC: 34.2 g/dL (ref 32.0–36.0)
MCV: 96.4 fL (ref 79.3–98.0)
MONO#: 0.5 10*3/uL (ref 0.1–0.9)
MONO%: 7.5 % (ref 0.0–14.0)
NEUT%: 63.1 % (ref 39.0–75.0)
NEUTROS ABS: 3.9 10*3/uL (ref 1.5–6.5)
PLATELETS: 148 10*3/uL (ref 140–400)
RBC: 3.91 10*6/uL — ABNORMAL LOW (ref 4.20–5.82)
RDW: 16 % — AB (ref 11.0–14.6)
WBC: 6.2 10*3/uL (ref 4.0–10.3)
lymph#: 1.8 10*3/uL (ref 0.9–3.3)

## 2017-02-20 LAB — COMPREHENSIVE METABOLIC PANEL
ALT: 155 U/L — ABNORMAL HIGH (ref 0–55)
AST: 39 U/L — AB (ref 5–34)
Albumin: 3.1 g/dL — ABNORMAL LOW (ref 3.5–5.0)
Alkaline Phosphatase: 191 U/L — ABNORMAL HIGH (ref 40–150)
Anion Gap: 10 mEq/L (ref 3–11)
BUN: 27.8 mg/dL — AB (ref 7.0–26.0)
CHLORIDE: 102 meq/L (ref 98–109)
CO2: 22 mEq/L (ref 22–29)
Calcium: 9.3 mg/dL (ref 8.4–10.4)
Creatinine: 1.3 mg/dL (ref 0.7–1.3)
EGFR: 57 mL/min/{1.73_m2} — AB (ref >90)
GLUCOSE: 238 mg/dL — AB (ref 70–140)
POTASSIUM: 4.4 meq/L (ref 3.5–5.1)
SODIUM: 134 meq/L — AB (ref 136–145)
Total Bilirubin: 0.98 mg/dL (ref 0.20–1.20)
Total Protein: 6.2 g/dL — ABNORMAL LOW (ref 6.4–8.3)

## 2017-02-20 LAB — MAGNESIUM: MAGNESIUM: 2 mg/dL (ref 1.5–2.5)

## 2017-02-20 LAB — TECHNOLOGIST REVIEW: Technologist Review: 2

## 2017-02-21 LAB — TACROLIMUS LEVEL: Tacrolimus (FK506), Blood: 8.2 ng/mL (ref 2.0–20.0)

## 2017-02-27 ENCOUNTER — Telehealth: Payer: Self-pay

## 2017-02-27 DIAGNOSIS — E1165 Type 2 diabetes mellitus with hyperglycemia: Secondary | ICD-10-CM | POA: Diagnosis not present

## 2017-02-27 DIAGNOSIS — T8609 Other complications of bone marrow transplant: Secondary | ICD-10-CM | POA: Diagnosis not present

## 2017-02-27 DIAGNOSIS — Z8619 Personal history of other infectious and parasitic diseases: Secondary | ICD-10-CM | POA: Diagnosis not present

## 2017-02-27 DIAGNOSIS — D89811 Chronic graft-versus-host disease: Secondary | ICD-10-CM | POA: Diagnosis not present

## 2017-02-27 DIAGNOSIS — E871 Hypo-osmolality and hyponatremia: Secondary | ICD-10-CM | POA: Diagnosis not present

## 2017-02-27 DIAGNOSIS — D46Z Other myelodysplastic syndromes: Secondary | ICD-10-CM | POA: Diagnosis not present

## 2017-02-27 DIAGNOSIS — Z7901 Long term (current) use of anticoagulants: Secondary | ICD-10-CM | POA: Diagnosis not present

## 2017-02-27 DIAGNOSIS — D469 Myelodysplastic syndrome, unspecified: Secondary | ICD-10-CM | POA: Diagnosis not present

## 2017-02-27 DIAGNOSIS — R002 Palpitations: Secondary | ICD-10-CM | POA: Diagnosis not present

## 2017-02-27 DIAGNOSIS — I10 Essential (primary) hypertension: Secondary | ICD-10-CM | POA: Diagnosis not present

## 2017-02-27 DIAGNOSIS — M1990 Unspecified osteoarthritis, unspecified site: Secondary | ICD-10-CM | POA: Diagnosis not present

## 2017-02-27 DIAGNOSIS — E2740 Unspecified adrenocortical insufficiency: Secondary | ICD-10-CM | POA: Diagnosis not present

## 2017-02-27 DIAGNOSIS — R739 Hyperglycemia, unspecified: Secondary | ICD-10-CM | POA: Diagnosis not present

## 2017-02-27 DIAGNOSIS — Z86718 Personal history of other venous thrombosis and embolism: Secondary | ICD-10-CM | POA: Diagnosis not present

## 2017-02-27 DIAGNOSIS — I82411 Acute embolism and thrombosis of right femoral vein: Secondary | ICD-10-CM | POA: Diagnosis not present

## 2017-02-27 DIAGNOSIS — E7800 Pure hypercholesterolemia, unspecified: Secondary | ICD-10-CM | POA: Diagnosis not present

## 2017-02-27 DIAGNOSIS — Z5181 Encounter for therapeutic drug level monitoring: Secondary | ICD-10-CM | POA: Diagnosis not present

## 2017-02-27 DIAGNOSIS — Z9484 Stem cells transplant status: Secondary | ICD-10-CM | POA: Diagnosis not present

## 2017-02-27 DIAGNOSIS — Z794 Long term (current) use of insulin: Secondary | ICD-10-CM | POA: Diagnosis not present

## 2017-02-27 DIAGNOSIS — T865 Complications of stem cell transplant: Secondary | ICD-10-CM | POA: Diagnosis not present

## 2017-02-27 DIAGNOSIS — K210 Gastro-esophageal reflux disease with esophagitis: Secondary | ICD-10-CM | POA: Diagnosis not present

## 2017-02-27 DIAGNOSIS — R5381 Other malaise: Secondary | ICD-10-CM | POA: Diagnosis not present

## 2017-02-27 DIAGNOSIS — R600 Localized edema: Secondary | ICD-10-CM | POA: Diagnosis not present

## 2017-02-27 DIAGNOSIS — R001 Bradycardia, unspecified: Secondary | ICD-10-CM | POA: Diagnosis not present

## 2017-02-27 DIAGNOSIS — T380X5A Adverse effect of glucocorticoids and synthetic analogues, initial encounter: Secondary | ICD-10-CM | POA: Diagnosis not present

## 2017-02-27 DIAGNOSIS — R5383 Other fatigue: Secondary | ICD-10-CM | POA: Diagnosis not present

## 2017-02-27 LAB — CBC AND DIFFERENTIAL
HCT: 39 — AB (ref 41–53)
Hemoglobin: 13.8 (ref 13.5–17.5)
Platelets: 176 (ref 150–399)
WBC: 7.6

## 2017-02-27 LAB — BASIC METABOLIC PANEL
BUN: 33 — AB (ref 4–21)
CREATININE: 1.3 (ref 0.6–1.3)
Glucose: 397
POTASSIUM: 4.9 (ref 3.4–5.3)
Sodium: 128 — AB (ref 137–147)

## 2017-02-27 LAB — HEPATIC FUNCTION PANEL
ALK PHOS: 163 — AB (ref 25–125)
ALT: 145 — AB (ref 10–40)
AST: 44 — AB (ref 14–40)
Bilirubin, Total: 1.6

## 2017-02-27 NOTE — Telephone Encounter (Signed)
Pt called for lab appt for 9/12 early AM for [facility] labs. inbasket sent.

## 2017-02-27 NOTE — Telephone Encounter (Signed)
Thank you :)

## 2017-02-28 ENCOUNTER — Telehealth: Payer: Self-pay | Admitting: Hematology and Oncology

## 2017-02-28 NOTE — Telephone Encounter (Signed)
Spoke with patient regarding his appt on 9/12.

## 2017-03-04 ENCOUNTER — Ambulatory Visit (INDEPENDENT_AMBULATORY_CARE_PROVIDER_SITE_OTHER): Payer: Medicare Other | Admitting: Family Medicine

## 2017-03-04 ENCOUNTER — Encounter: Payer: Self-pay | Admitting: Family Medicine

## 2017-03-04 VITALS — BP 130/82 | HR 63 | Temp 98.4°F | Wt 214.0 lb

## 2017-03-04 DIAGNOSIS — T380X5A Adverse effect of glucocorticoids and synthetic analogues, initial encounter: Secondary | ICD-10-CM

## 2017-03-04 DIAGNOSIS — D469 Myelodysplastic syndrome, unspecified: Secondary | ICD-10-CM | POA: Diagnosis not present

## 2017-03-04 DIAGNOSIS — Z9484 Stem cells transplant status: Secondary | ICD-10-CM | POA: Diagnosis not present

## 2017-03-04 DIAGNOSIS — I82411 Acute embolism and thrombosis of right femoral vein: Secondary | ICD-10-CM

## 2017-03-04 DIAGNOSIS — D89813 Graft-versus-host disease, unspecified: Secondary | ICD-10-CM | POA: Diagnosis not present

## 2017-03-04 DIAGNOSIS — R739 Hyperglycemia, unspecified: Secondary | ICD-10-CM | POA: Diagnosis not present

## 2017-03-04 NOTE — Progress Notes (Signed)
Subjective:  Brandon [name] is a 66 y.o. year old very pleasant male patient who presents for/with See problem oriented charting ROS- admits to polyuria which is lessening at this point, feels some weakness. Has numbness/tingling in hands and feet on tacrolimus.    Past Medical History-  Patient Active Problem List   Diagnosis Date Noted  . GVHD (graft versus host disease) ([place]) 03/04/2017    Priority: High  . MDS (myelodysplastic syndrome) ([place]) 12/06/2015    Priority: High  . Steroid-induced hyperglycemia 08/16/2014    Priority: High  . BPH associated with nocturia 11/15/2014    Priority: Medium  . Leukopenia 11/15/2014    Priority: Medium  . Hyperlipidemia 02/04/2007    Priority: Medium  . Essential hypertension 02/04/2007    Priority: Medium  . History of stem cell transplant ([place]) 03/04/2017    Priority: Low  . Deep vein thrombosis (DVT) of femoral vein of right lower extremity ([place]) 03/04/2017    Priority: Low  . Chest pain 08/13/2014    Priority: Low  . GERD (gastroesophageal reflux disease) 05/03/2014    Priority: Low  . Cervical disc disorder with radiculopathy of cervical region 07/14/2010    Priority: Low  . MIXED HEARING LOSS BILATERAL 07/14/2010    Priority: Low  . ERECTILE DYSFUNCTION 02/07/2007    Priority: Low    Medications- reviewed and updated Current Outpatient Prescriptions  Medication Sig Dispense Refill  . acyclovir (ZOVIRAX) 400 MG tablet Take 400 mg by mouth 2 (two) times daily.     Marland Kitchen atenolol (TENORMIN) 50 MG tablet Take 1 tablet (50 mg total) by mouth daily. (Patient taking differently: Take 25 mg by mouth every evening. ) 90 tablet 3  . enoxaparin (LOVENOX) 80 MG/0.8ML injection Inject 80 mg into the skin.    . fluticasone (FLONASE) 50 MCG/ACT nasal spray     . folic acid (FOLVITE) 829 MCG tablet Take 400 mcg by mouth 2 (two) times daily.    Marland Kitchen HYDROCORTISONE PO Take 10 mg by mouth. Take 2 tablets in AM and 1 tablet in PM    . loratadine  (CLARITIN) 10 MG tablet Take 10 mg by mouth daily as needed.    Marland Kitchen LORazepam (ATIVAN) 0.5 MG tablet Take 0.5 mg by mouth as needed.    . Magnesium Oxide 400 MG CAPS Take 1 capsule (400 mg total) by mouth 5 (five) times daily.    Marland Kitchen omeprazole (PRILOSEC OTC) 20 MG tablet Take 2 tablets (40 mg total) by mouth 2 (two) times daily. take 1 tablet by mouth once daily    . polyethylene glycol (MIRALAX / GLYCOLAX) packet Take 17 g by mouth daily as needed.    . posaconazole (NOXAFIL) 100 MG TBEC delayed-release tablet Take 300 mg by mouth daily.    . predniSONE (DELTASONE) 20 MG tablet Take 20 mg by mouth 3 (three) times daily.    Marland Kitchen senna-docusate (SENOKOT-S) 8.6-50 MG tablet Take 1 tablet by mouth Nightly.    . sulfamethoxazole-trimethoprim (BACTRIM,SEPTRA) 400-80 MG tablet Take 1 tablet by mouth daily.    . tacrolimus (PROGRAF) 0.5 MG capsule Take 0.5 mg by mouth daily.  5   No current facility-administered medications for this visit.     Objective: BP 130/82   Pulse 63   Temp 98.4 F (36.9 C)   Wt 214 lb (97.1 kg)   SpO2 96%   BMI 29.85 kg/m  Gen: NAD, resting comfortably, wears mask CV: RRR no murmurs rubs or gallops Lungs: CTAB  no crackles, wheeze, rhonchi Abdomen: soft/nontender/nondistended/normal bowel sounds. No rebound or guarding.  Ext: trace edema Skin: warm, dry  Assessment/Plan:  Steroid-induced hyperglycemia S: From [facility] note 02/27/17" "1. Steroid induced hyperglycemia A: sugars in 390-440 range in clinic (despite diet change)-pt with urinary frequency and weight down substantially suggesting volume depletion P: 10U short acting insulin x 2 given and 1L NS iv and sugar came down to 350--pt given sliding scale instructions and some diabetes teaching by infusion room providers--he will see his PCP early next week to f/u"  Summary- on high dose steroids leading to hyperglycemia. He started at 100mg  prednisone august 8th and is now down to 60mg . Now symptomatic with weight loss  and urinary frequency. CBGs upwards of 400 despite medication changes. 10 units of short acting insulin x2 in clinic and NS led to CBG under 350. Sent home on sliding scale regular insulin.   Patient reports since being home fastings have been 206-263, before lunch 252-354, before dinner 220-364.   Has been using sliding scale insulin as follows CBG <70= hypoglycemia protocol. He is aware of how to treat this.  70-130= 0 131-180= 2 181-240= 4 241-300= 6 301-350= 8 351-400=10 >400 = 12 This is consider low dose, there is a moderate dose of 0,4,8,10,12,16, 20.  A/P: Long discussion today about steroid induced hyperglycemia- will refer to diabetes education but will not label this as diabetes. Will not check an a1c but will focus on getting CBGs more in 80-180- hopeful this improves as prednisone levels go down/off potentially.  Unfortunately has to be on prednisone due to graft versus host disease- needs to be on tacrolimus even if at low dose and raises LFTs.   Asked him to update me weekly- if continues at current level would increase to moderate dose. Could consider long acting insulin but given treatments would run this by [facility] first- he was started on this per their protocol.   MDS (myelodysplastic syndrome) ([place]) Continues care at [facility]. S/p stem cell transplant last year. Now dealing with GVHD  History of stem cell transplant ([place]) [facility] 2017. Doing reasonably well. Counseled him- tough with several bumps in the road including DVT, GVHD, dental issues, now steroid induced hypoglycemia afterwards.   Deep vein thrombosis (DVT) of femoral vein of right lower extremity ([facility]) On lovenox through [facility]. Likely through end of the year at least per patient  GVHD (graft versus host disease) ([place]) S/p bone marrow transplant. Continue to follow at [facility]. On prednisone  Orders Placed This Encounter  Procedures  . Amb Referral to Nutrition and Diabetic E    Referral Priority:   Urgent     Referral Type:   Consultation    Referral Reason:   Specialty Services Required    Number of Visits Requested:   1    Meds ordered this encounter  Medications  . enoxaparin (LOVENOX) 80 MG/0.8ML injection    Sig: Inject 80 mg into the skin.  . folic acid (FOLVITE) 759 MCG tablet    Sig: Take 400 mcg by mouth 2 (two) times daily.  . predniSONE (DELTASONE) 20 MG tablet    Sig: Take 20 mg by mouth 3 (three) times daily.  . polyethylene glycol (MIRALAX / GLYCOLAX) packet    Sig: Take 17 g by mouth daily as needed.   Return precautions advised.  Garret Reddish, MD

## 2017-03-04 NOTE — Assessment & Plan Note (Addendum)
On lovenox through [place]. Likely through end of the year at least per patient

## 2017-03-04 NOTE — Assessment & Plan Note (Signed)
S/p bone marrow transplant. Continue to follow at [facility]. On prednisone

## 2017-03-04 NOTE — Assessment & Plan Note (Signed)
Continues care at [facility]. S/p stem cell transplant last year. Now dealing with GVHD

## 2017-03-04 NOTE — Assessment & Plan Note (Addendum)
[facility] 2017. Doing reasonably well. Counseled him- tough with several bumps in the road including DVT, GVHD, dental issues, now steroid induced hypoglycemia afterwards.

## 2017-03-04 NOTE — Assessment & Plan Note (Addendum)
S: From [facility] note 02/27/17" "1. Steroid induced hyperglycemia A: sugars in 390-440 range in clinic (despite diet change)-pt with urinary frequency and weight down substantially suggesting volume depletion P: 10U short acting insulin x 2 given and 1L NS iv and sugar came down to 350--pt given sliding scale instructions and some diabetes teaching by infusion room providers--he will see his PCP early next week to f/u"  Summary- on high dose steroids leading to hyperglycemia. He started at 100mg  prednisone august 8th and is now down to 60mg . Now symptomatic with weight loss and urinary frequency. CBGs upwards of 400 despite medication changes. 10 units of short acting insulin x2 in clinic and NS led to CBG under 350. Sent home on sliding scale regular insulin.   Patient reports since being home fastings have been 206-263, before lunch 252-354, before dinner 220-364.   Has been using sliding scale insulin as follows CBG <70= hypoglycemia protocol. He is aware of how to treat this.  70-130= 0 131-180= 2 181-240= 4 241-300= 6 301-350= 8 351-400=10 >400 = 12 This is consider low dose, there is a moderate dose of 0,4,8,10,12,16, 20.  A/P: Long discussion today about steroid induced hyperglycemia- will refer to diabetes education but will not label this as diabetes. Will not check an a1c but will focus on getting CBGs more in 80-180- hopeful this improves as prednisone levels go down/off potentially.  Unfortunately has to be on prednisone due to graft versus host disease- needs to be on tacrolimus even if at low dose and raises LFTs.   Asked him to update me weekly- if continues at current level would increase to moderate dose. Could consider long acting insulin but given treatments would run this by [facility] first- he was started on this per their protocol.

## 2017-03-04 NOTE — Patient Instructions (Signed)
Would love to see a log of your blood sugars each week until we get you into 80-180 range. Contact us immediately for any sugars under 70.   We will call you within a week or two about your referral to diabetes education. If you do not hear within 2 weeks, give [place] a call.

## 2017-03-06 ENCOUNTER — Telehealth: Payer: Self-pay | Admitting: *Deleted

## 2017-03-06 ENCOUNTER — Other Ambulatory Visit (HOSPITAL_BASED_OUTPATIENT_CLINIC_OR_DEPARTMENT_OTHER): Payer: Medicare Other

## 2017-03-06 DIAGNOSIS — D4621 Refractory anemia with excess of blasts 1: Secondary | ICD-10-CM

## 2017-03-06 DIAGNOSIS — D469 Myelodysplastic syndrome, unspecified: Secondary | ICD-10-CM

## 2017-03-06 LAB — CBC WITH DIFFERENTIAL/PLATELET
BASO%: 0.1 % (ref 0.0–2.0)
Basophils Absolute: 0 10*3/uL (ref 0.0–0.1)
EOS ABS: 0 10*3/uL (ref 0.0–0.5)
EOS%: 0.1 % (ref 0.0–7.0)
HEMATOCRIT: 37.3 % — AB (ref 38.4–49.9)
HEMOGLOBIN: 12.9 g/dL — AB (ref 13.0–17.1)
LYMPH%: 28.3 % (ref 14.0–49.0)
MCH: 33.2 pg (ref 27.2–33.4)
MCHC: 34.6 g/dL (ref 32.0–36.0)
MCV: 95.9 fL (ref 79.3–98.0)
MONO#: 0.6 10*3/uL (ref 0.1–0.9)
MONO%: 8.5 % (ref 0.0–14.0)
NEUT#: 4.5 10*3/uL (ref 1.5–6.5)
NEUT%: 63 % (ref 39.0–75.0)
Platelets: 126 10*3/uL — ABNORMAL LOW (ref 140–400)
RBC: 3.89 10*6/uL — ABNORMAL LOW (ref 4.20–5.82)
RDW: 15.7 % — AB (ref 11.0–14.6)
WBC: 7.2 10*3/uL (ref 4.0–10.3)
lymph#: 2 10*3/uL (ref 0.9–3.3)

## 2017-03-06 LAB — COMPREHENSIVE METABOLIC PANEL
ALBUMIN: 3.1 g/dL — AB (ref 3.5–5.0)
ALK PHOS: 164 U/L — AB (ref 40–150)
ALT: 141 U/L — ABNORMAL HIGH (ref 0–55)
AST: 35 U/L — ABNORMAL HIGH (ref 5–34)
Anion Gap: 11 mEq/L (ref 3–11)
BUN: 19.5 mg/dL (ref 7.0–26.0)
CO2: 22 mEq/L (ref 22–29)
Calcium: 9 mg/dL (ref 8.4–10.4)
Chloride: 103 mEq/L (ref 98–109)
Creatinine: 1.1 mg/dL (ref 0.7–1.3)
EGFR: 69 mL/min/{1.73_m2} — AB (ref >90)
Glucose: 197 mg/dl — ABNORMAL HIGH (ref 70–140)
POTASSIUM: 3.9 meq/L (ref 3.5–5.1)
Sodium: 136 mEq/L (ref 136–145)
Total Bilirubin: 0.94 mg/dL (ref 0.20–1.20)
Total Protein: 5.8 g/dL — ABNORMAL LOW (ref 6.4–8.3)

## 2017-03-06 LAB — TECHNOLOGIST REVIEW

## 2017-03-06 LAB — MAGNESIUM: MAGNESIUM: 1.8 mg/dL (ref 1.5–2.5)

## 2017-03-06 NOTE — Telephone Encounter (Signed)
Received call from [facility] requesting lab reports from today.  Faxed to [phone]

## 2017-03-07 ENCOUNTER — Encounter: Payer: Self-pay | Admitting: Family Medicine

## 2017-03-07 LAB — TACROLIMUS LEVEL: TACROLIMUS LVL: 7.1 ng/mL (ref 2.0–20.0)

## 2017-03-11 ENCOUNTER — Encounter: Payer: Self-pay | Admitting: Family Medicine

## 2017-03-13 DIAGNOSIS — T865 Complications of stem cell transplant: Secondary | ICD-10-CM | POA: Diagnosis not present

## 2017-03-13 DIAGNOSIS — I10 Essential (primary) hypertension: Secondary | ICD-10-CM | POA: Diagnosis not present

## 2017-03-13 DIAGNOSIS — D469 Myelodysplastic syndrome, unspecified: Secondary | ICD-10-CM | POA: Diagnosis not present

## 2017-03-13 DIAGNOSIS — D89813 Graft-versus-host disease, unspecified: Secondary | ICD-10-CM | POA: Diagnosis not present

## 2017-03-13 DIAGNOSIS — Z006 Encounter for examination for normal comparison and control in clinical research program: Secondary | ICD-10-CM | POA: Diagnosis not present

## 2017-03-13 DIAGNOSIS — E2749 Other adrenocortical insufficiency: Secondary | ICD-10-CM | POA: Diagnosis not present

## 2017-03-13 DIAGNOSIS — T50905A Adverse effect of unspecified drugs, medicaments and biological substances, initial encounter: Secondary | ICD-10-CM | POA: Diagnosis not present

## 2017-03-13 DIAGNOSIS — Z86718 Personal history of other venous thrombosis and embolism: Secondary | ICD-10-CM | POA: Diagnosis not present

## 2017-03-13 DIAGNOSIS — R739 Hyperglycemia, unspecified: Secondary | ICD-10-CM | POA: Diagnosis not present

## 2017-03-13 DIAGNOSIS — D4621 Refractory anemia with excess of blasts 1: Secondary | ICD-10-CM | POA: Diagnosis not present

## 2017-03-13 DIAGNOSIS — T8609 Other complications of bone marrow transplant: Secondary | ICD-10-CM | POA: Diagnosis not present

## 2017-03-13 DIAGNOSIS — R600 Localized edema: Secondary | ICD-10-CM | POA: Diagnosis not present

## 2017-03-13 DIAGNOSIS — Z7901 Long term (current) use of anticoagulants: Secondary | ICD-10-CM | POA: Diagnosis not present

## 2017-03-13 DIAGNOSIS — R002 Palpitations: Secondary | ICD-10-CM | POA: Diagnosis not present

## 2017-03-13 DIAGNOSIS — T380X5D Adverse effect of glucocorticoids and synthetic analogues, subsequent encounter: Secondary | ICD-10-CM | POA: Diagnosis not present

## 2017-03-13 DIAGNOSIS — D89811 Chronic graft-versus-host disease: Secondary | ICD-10-CM | POA: Diagnosis not present

## 2017-03-15 ENCOUNTER — Telehealth: Payer: Self-pay

## 2017-03-15 NOTE — Telephone Encounter (Signed)
Called pt with appt date/time. He requested if it could be earlier. Call lab intake and got changed to 0845. Per pt placed inbasket for lab 10/10 and 10/24,  through October. This is for [facility] - tapering prednisone, and pt on tacrolimus.  Pt does look at [facility] for labs dates/times

## 2017-03-15 NOTE — Telephone Encounter (Signed)
Pt would like to schedule repeat labs this Wednesday.

## 2017-03-15 NOTE — Telephone Encounter (Signed)
Scheduled for WED 03/21/17 at 1045am. Thank you.

## 2017-03-18 ENCOUNTER — Telehealth: Payer: Self-pay | Admitting: Hematology and Oncology

## 2017-03-18 ENCOUNTER — Encounter: Payer: Self-pay | Admitting: Family Medicine

## 2017-03-18 NOTE — Telephone Encounter (Signed)
Spoke with patient and scheduled appts per 9/21 sch msg

## 2017-03-20 ENCOUNTER — Other Ambulatory Visit: Payer: Medicare Other

## 2017-03-20 ENCOUNTER — Other Ambulatory Visit (HOSPITAL_BASED_OUTPATIENT_CLINIC_OR_DEPARTMENT_OTHER): Payer: Medicare Other

## 2017-03-20 DIAGNOSIS — D4621 Refractory anemia with excess of blasts 1: Secondary | ICD-10-CM

## 2017-03-20 DIAGNOSIS — D469 Myelodysplastic syndrome, unspecified: Secondary | ICD-10-CM

## 2017-03-20 LAB — COMPREHENSIVE METABOLIC PANEL
ALBUMIN: 3.2 g/dL — AB (ref 3.5–5.0)
ALK PHOS: 144 U/L (ref 40–150)
ALT: 137 U/L — AB (ref 0–55)
AST: 43 U/L — AB (ref 5–34)
Anion Gap: 9 mEq/L (ref 3–11)
BILIRUBIN TOTAL: 0.77 mg/dL (ref 0.20–1.20)
BUN: 26.1 mg/dL — AB (ref 7.0–26.0)
CO2: 24 meq/L (ref 22–29)
CREATININE: 1.3 mg/dL (ref 0.7–1.3)
Calcium: 9.2 mg/dL (ref 8.4–10.4)
Chloride: 103 mEq/L (ref 98–109)
EGFR: 55 mL/min/{1.73_m2} — AB (ref >90)
GLUCOSE: 173 mg/dL — AB (ref 70–140)
Potassium: 4.4 mEq/L (ref 3.5–5.1)
SODIUM: 136 meq/L (ref 136–145)
TOTAL PROTEIN: 6 g/dL — AB (ref 6.4–8.3)

## 2017-03-20 LAB — CBC WITH DIFFERENTIAL/PLATELET
BASO%: 0 % (ref 0.0–2.0)
Basophils Absolute: 0 10*3/uL (ref 0.0–0.1)
EOS ABS: 0 10*3/uL (ref 0.0–0.5)
EOS%: 0.1 % (ref 0.0–7.0)
HCT: 36.9 % — ABNORMAL LOW (ref 38.4–49.9)
HEMOGLOBIN: 12.4 g/dL — AB (ref 13.0–17.1)
LYMPH%: 16.5 % (ref 14.0–49.0)
MCH: 33.2 pg (ref 27.2–33.4)
MCHC: 33.6 g/dL (ref 32.0–36.0)
MCV: 98.7 fL — AB (ref 79.3–98.0)
MONO#: 0.5 10*3/uL (ref 0.1–0.9)
MONO%: 7 % (ref 0.0–14.0)
NEUT%: 76.4 % — ABNORMAL HIGH (ref 39.0–75.0)
NEUTROS ABS: 5.8 10*3/uL (ref 1.5–6.5)
Platelets: 113 10*3/uL — ABNORMAL LOW (ref 140–400)
RBC: 3.74 10*6/uL — AB (ref 4.20–5.82)
RDW: 16.3 % — AB (ref 11.0–14.6)
WBC: 7.6 10*3/uL (ref 4.0–10.3)
lymph#: 1.3 10*3/uL (ref 0.9–3.3)

## 2017-03-20 LAB — MAGNESIUM: Magnesium: 1.9 mg/dl (ref 1.5–2.5)

## 2017-03-21 LAB — TACROLIMUS LEVEL: TACROLIMUS LVL: 12 ng/mL (ref 2.0–20.0)

## 2017-03-22 ENCOUNTER — Encounter: Payer: Medicare Other | Attending: Family Medicine | Admitting: Dietician

## 2017-03-22 ENCOUNTER — Encounter: Payer: Self-pay | Admitting: Dietician

## 2017-03-22 DIAGNOSIS — R739 Hyperglycemia, unspecified: Secondary | ICD-10-CM | POA: Diagnosis not present

## 2017-03-22 DIAGNOSIS — T380X5A Adverse effect of glucocorticoids and synthetic analogues, initial encounter: Secondary | ICD-10-CM | POA: Insufficient documentation

## 2017-03-22 DIAGNOSIS — X58XXXA Exposure to other specified factors, initial encounter: Secondary | ICD-10-CM | POA: Insufficient documentation

## 2017-03-22 DIAGNOSIS — Z713 Dietary counseling and surveillance: Secondary | ICD-10-CM | POA: Insufficient documentation

## 2017-03-22 DIAGNOSIS — E119 Type 2 diabetes mellitus without complications: Secondary | ICD-10-CM

## 2017-03-22 NOTE — Patient Instructions (Signed)
Great job on the changes you have made! Consider calorie Edison Pace app Continue to read labels for carbohydrate and fat.  Aim for 4 Carb Choices per meal (60 grams) +/- 1 either way  Aim for 0-2 Carbs per snack if hungry  Include protein in moderation with your meals and snacks Consider  increasing your activity level by walking for 15 minutes daily as tolerated Continue checking BG at alternate times per day as directed by MD  Continue taking medication as directed by MD

## 2017-03-25 ENCOUNTER — Encounter: Payer: Self-pay | Admitting: Family Medicine

## 2017-03-25 NOTE — Progress Notes (Signed)
Diabetes Self-Management Education  Visit Type: First/Initial  Appt. Start Time: 1615 Appt. End Time: 1800  03/25/2017  [name], identified by name and date of birth, is a 66 y.o. male with a diagnosis of Diabetes: Type 2 (steroid induced). Hx includes leukemia s/p  Bone marrow transplant- 11 months post, MDS (myelodysplastic syndrome) and chronic GVHD.  He was diagnosed with steroid induced hyperglycemia 01/26/17 with a blood sugar over 400.  His blood sugar decreased to 150-271 after insulin.  Weight 225 lbs 2 weeks ago and 215 lbs today.  He has not been trying to lost weight. Medications include Levemier and Novolin  Patient lives with his wife.  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  ASSESSMENT  Height 6' (1.829 m), weight 215 lb (97.5 kg). Body mass index is 29.16 kg/m.      Diabetes Self-Management Education - 03/22/17 1635      Visit Information   Visit Type First/Initial     Initial Visit   Diabetes Type Type 2  steroid induced   Are you currently following a meal plan? No   Are you taking your medications as prescribed? Yes     Health Coping   How would you rate your overall health? Fair     Psychosocial Assessment   Patient Belief/Attitude about Diabetes Motivated to manage diabetes   Self-care barriers None   Self-management support Doctor's office;Family   Other persons present Patient;Spouse/SO   Patient Concerns Nutrition/Meal planning;Glycemic Control   Special Needs None   Preferred Learning Style No preference indicated   Learning Readiness Ready   How often do you need to have someone help you when you read instructions, pamphlets, or other written materials from your doctor or pharmacy? 1 - Never   What is the last grade level you completed in school? Graduate school     Pre-Education Assessment   Patient understands the diabetes disease and treatment process. Needs Instruction   Patient understands incorporating nutritional management into lifestyle. Needs Instruction   Patient undertands incorporating physical activity into lifestyle. Needs Instruction   Patient understands using medications safely. Needs Instruction   Patient understands monitoring blood glucose, interpreting and using results Needs Instruction   Patient understands prevention, detection, and treatment of acute complications. Needs Instruction   Patient understands prevention, detection, and treatment of chronic complications. Needs Instruction   Patient understands how to develop strategies to address psychosocial issues. Needs Instruction   Patient understands how to develop strategies to promote health/change behavior. Needs Instruction     Complications   How often do you check your blood sugar? 3-4 times/day   Fasting Blood glucose range (mg/dL) 70-129;130-179   Postprandial Blood glucose range (mg/dL) 130-179;180-200;>200   Number of hypoglycemic episodes per month 1   Can you tell when your blood sugar is low? Yes   What do you do if your blood sugar is low? drink juice   Number of hyperglycemic episodes per week 14   Can you tell when your blood sugar is high? No   Have you had a dilated eye exam in the past 12 months? Yes   Have you had a dental exam in the past 12 months? Yes   Are you checking your feet? No     Dietary Intake   Breakfast greek yogurt and fruit  takes insulin before this 6-8   Snack (morning) out to eat (vege omelet, bacon or sausage) OR protein bar  10   Lunch cheese and fruit, sometimes raw vegetables, sometimes leftover meat OR tuna salad with plain greek yogur on [employer] bread  1-2   Snack (afternoon) cheese or nuts, apple OR adkins snack   Dinner 6-8 ounces of chicken, beef, pork, or salmon or shrimp or crab cakes, quinoa or sweet potato, vegetables OR Frozen Chinese on brown rice with extra chicken and vegetables OR spaghetti squash at [employer] and chicken  parmesean  6   Snack (evening) beef jerky, 1/2 cup nuts, lite popcorn, diet pudding, sugar free popsickles   Beverage(s) water, 1 diet soda daily, 2 cups coffee (black), fairlife high protein milk     Exercise   Exercise Type ADL's  working on stairs 12 times per day.  Used to walk but has to avoid the sun currently   How many days per week to you exercise? 0   How many minutes per day do you exercise? 0   Total minutes per week of exercise 0     Patient Education   Previous Diabetes Education No   Disease state  Definition of diabetes, type 1 and 2, and the diagnosis of diabetes   Nutrition management  Role of diet in the treatment of diabetes and the relationship between the three main macronutrients and blood glucose level;Food label reading, portion sizes and measuring food.;Meal options for control of blood glucose level and chronic complications.;Information on hints to eating out and maintain blood glucose control.;Carbohydrate counting;Reviewed blood glucose goals for pre and post meals and how to evaluate the patients' food intake on their blood glucose level.   Physical activity and exercise  Role of exercise on diabetes management, blood pressure control and cardiac health.   Medications Taught/reviewed insulin injection, site rotation, insulin storage and needle disposal.;Reviewed patients medication for diabetes, action, purpose, timing of dose and side effects.   Monitoring Purpose and frequency of SMBG.;Identified appropriate SMBG and/or A1C goals.;Daily foot exams;Yearly dilated eye exam   Acute complications Taught treatment of hypoglycemia - the 15 rule.;Discussed and identified patients' treatment of hyperglycemia.   Chronic complications Relationship between chronic complications and blood glucose control;Dental care   Psychosocial adjustment Worked with patient to identify barriers to care and solutions;Role of stress on diabetes;Identified and addressed patients feelings  and concerns about diabetes     Individualized Goals (developed by patient)   Nutrition General guidelines for healthy choices and portions discussed   Physical Activity Exercise 3-5 times per week;15 minutes per day   Medications take my medication as prescribed   Monitoring  test my blood glucose as discussed   Reducing Risk examine blood glucose patterns;do foot checks daily   Health Coping discuss diabetes with (comment)  MD/RD     Post-Education Assessment   Patient understands the diabetes disease and treatment process. Demonstrates understanding / competency   Patient understands incorporating nutritional management into lifestyle. Demonstrates understanding / competency   Patient undertands incorporating physical activity into lifestyle. Demonstrates understanding / competency   Patient understands using medications safely. Demonstrates understanding / competency   Patient understands monitoring blood glucose, interpreting and using results Demonstrates understanding / competency   Patient understands prevention, detection, and treatment of acute complications. Demonstrates understanding / competency   Patient understands prevention, detection, and treatment of chronic complications. Demonstrates understanding / competency   Patient understands how to develop strategies to address psychosocial issues. Demonstrates understanding / competency   Patient understands how to develop strategies to promote health/change behavior. Demonstrates understanding / competency  Outcomes   Expected Outcomes Demonstrated interest in learning. Expect positive outcomes   Future DMSE PRN   Program Status Completed      Individualized Plan for Diabetes Self-Management Training:   Learning Objective:  Patient will have a greater understanding of diabetes self-management. Patient education plan is to attend individual and/or group sessions per assessed needs and concerns.   Plan:   Patient  Instructions  Doristine Devoid job on the changes you have made! Consider calorie Edison Pace app Continue to read labels for carbohydrate and fat.  Aim for 4 Carb Choices per meal (60 grams) +/- 1 either way  Aim for 0-2 Carbs per snack if hungry  Include protein in moderation with your meals and snacks Consider  increasing your activity level by walking for 15 minutes daily as tolerated Continue checking BG at alternate times per day as directed by MD  Continue taking medication as directed by MD      Expected Outcomes:  Demonstrated interest in learning. Expect positive outcomes  Education material provided: Living Well with Diabetes, Food label handouts, A1C conversion sheet, Meal plan card, My Plate and Snack sheet  If problems or questions, patient to contact team via:  Phone  Future DSME appointment: PRN

## 2017-03-26 ENCOUNTER — Telehealth: Payer: Self-pay | Admitting: Hematology and Oncology

## 2017-03-26 NOTE — Telephone Encounter (Signed)
Faxed labs to [facility]

## 2017-03-27 ENCOUNTER — Encounter: Payer: Self-pay | Admitting: Physical Therapy

## 2017-03-27 DIAGNOSIS — Z79899 Other long term (current) drug therapy: Secondary | ICD-10-CM | POA: Diagnosis not present

## 2017-03-27 DIAGNOSIS — I10 Essential (primary) hypertension: Secondary | ICD-10-CM | POA: Diagnosis not present

## 2017-03-27 DIAGNOSIS — H2513 Age-related nuclear cataract, bilateral: Secondary | ICD-10-CM | POA: Diagnosis not present

## 2017-03-27 DIAGNOSIS — D89813 Graft-versus-host disease, unspecified: Secondary | ICD-10-CM | POA: Diagnosis not present

## 2017-03-27 DIAGNOSIS — R739 Hyperglycemia, unspecified: Secondary | ICD-10-CM | POA: Diagnosis not present

## 2017-03-27 DIAGNOSIS — T50905A Adverse effect of unspecified drugs, medicaments and biological substances, initial encounter: Secondary | ICD-10-CM | POA: Diagnosis not present

## 2017-03-27 DIAGNOSIS — H1089 Other conjunctivitis: Secondary | ICD-10-CM | POA: Diagnosis not present

## 2017-03-27 DIAGNOSIS — I82441 Acute embolism and thrombosis of right tibial vein: Secondary | ICD-10-CM | POA: Diagnosis not present

## 2017-03-27 DIAGNOSIS — Z9484 Stem cells transplant status: Secondary | ICD-10-CM | POA: Diagnosis not present

## 2017-03-27 DIAGNOSIS — D89811 Chronic graft-versus-host disease: Secondary | ICD-10-CM | POA: Diagnosis not present

## 2017-03-27 DIAGNOSIS — T380X5D Adverse effect of glucocorticoids and synthetic analogues, subsequent encounter: Secondary | ICD-10-CM | POA: Diagnosis not present

## 2017-03-27 DIAGNOSIS — D469 Myelodysplastic syndrome, unspecified: Secondary | ICD-10-CM | POA: Diagnosis not present

## 2017-03-27 DIAGNOSIS — E7800 Pure hypercholesterolemia, unspecified: Secondary | ICD-10-CM | POA: Diagnosis not present

## 2017-03-27 DIAGNOSIS — H04123 Dry eye syndrome of bilateral lacrimal glands: Secondary | ICD-10-CM | POA: Diagnosis not present

## 2017-03-27 DIAGNOSIS — E2749 Other adrenocortical insufficiency: Secondary | ICD-10-CM | POA: Diagnosis not present

## 2017-03-27 DIAGNOSIS — Z5181 Encounter for therapeutic drug level monitoring: Secondary | ICD-10-CM | POA: Diagnosis not present

## 2017-03-27 DIAGNOSIS — K210 Gastro-esophageal reflux disease with esophagitis: Secondary | ICD-10-CM | POA: Diagnosis not present

## 2017-03-27 DIAGNOSIS — M1990 Unspecified osteoarthritis, unspecified site: Secondary | ICD-10-CM | POA: Diagnosis not present

## 2017-03-27 DIAGNOSIS — T8609 Other complications of bone marrow transplant: Secondary | ICD-10-CM | POA: Diagnosis not present

## 2017-04-01 ENCOUNTER — Encounter: Payer: Self-pay | Admitting: Family Medicine

## 2017-04-03 ENCOUNTER — Other Ambulatory Visit (HOSPITAL_BASED_OUTPATIENT_CLINIC_OR_DEPARTMENT_OTHER): Payer: Medicare Other

## 2017-04-03 DIAGNOSIS — D469 Myelodysplastic syndrome, unspecified: Secondary | ICD-10-CM | POA: Diagnosis not present

## 2017-04-03 DIAGNOSIS — D4621 Refractory anemia with excess of blasts 1: Secondary | ICD-10-CM

## 2017-04-03 LAB — MAGNESIUM: Magnesium: 2 mg/dl (ref 1.5–2.5)

## 2017-04-03 LAB — CBC WITH DIFFERENTIAL/PLATELET
BASO%: 0.2 % (ref 0.0–2.0)
Basophils Absolute: 0 10*3/uL (ref 0.0–0.1)
EOS ABS: 0 10*3/uL (ref 0.0–0.5)
EOS%: 0.4 % (ref 0.0–7.0)
HEMATOCRIT: 36.9 % — AB (ref 38.4–49.9)
HEMOGLOBIN: 12 g/dL — AB (ref 13.0–17.1)
LYMPH%: 30.6 % (ref 14.0–49.0)
MCH: 33.1 pg (ref 27.2–33.4)
MCHC: 32.5 g/dL (ref 32.0–36.0)
MCV: 101.7 fL — AB (ref 79.3–98.0)
MONO#: 0.4 10*3/uL (ref 0.1–0.9)
MONO%: 6.4 % (ref 0.0–14.0)
NEUT%: 62.4 % (ref 39.0–75.0)
NEUTROS ABS: 3.4 10*3/uL (ref 1.5–6.5)
PLATELETS: 119 10*3/uL — AB (ref 140–400)
RBC: 3.63 10*6/uL — ABNORMAL LOW (ref 4.20–5.82)
RDW: 16.5 % — AB (ref 11.0–14.6)
WBC: 5.5 10*3/uL (ref 4.0–10.3)
lymph#: 1.7 10*3/uL (ref 0.9–3.3)

## 2017-04-03 LAB — COMPREHENSIVE METABOLIC PANEL
ALBUMIN: 3.2 g/dL — AB (ref 3.5–5.0)
ALK PHOS: 151 U/L — AB (ref 40–150)
ALT: 119 U/L — ABNORMAL HIGH (ref 0–55)
ANION GAP: 10 meq/L (ref 3–11)
AST: 45 U/L — ABNORMAL HIGH (ref 5–34)
BILIRUBIN TOTAL: 0.55 mg/dL (ref 0.20–1.20)
BUN: 20.9 mg/dL (ref 7.0–26.0)
CALCIUM: 9 mg/dL (ref 8.4–10.4)
CO2: 24 mEq/L (ref 22–29)
Chloride: 107 mEq/L (ref 98–109)
Creatinine: 1.2 mg/dL (ref 0.7–1.3)
EGFR: 60 mL/min/{1.73_m2} — AB (ref >60)
Glucose: 85 mg/dl (ref 70–140)
Potassium: 3.9 mEq/L (ref 3.5–5.1)
Sodium: 141 mEq/L (ref 136–145)
TOTAL PROTEIN: 5.9 g/dL — AB (ref 6.4–8.3)

## 2017-04-03 LAB — TECHNOLOGIST REVIEW

## 2017-04-04 ENCOUNTER — Telehealth: Payer: Self-pay | Admitting: *Deleted

## 2017-04-04 LAB — TACROLIMUS LEVEL: Tacrolimus (FK506), Blood: 9.9 ng/mL (ref 2.0–20.0)

## 2017-04-04 NOTE — Telephone Encounter (Signed)
Received call from [facility] requesting lab results.  Labs faxed to [phone].

## 2017-04-08 ENCOUNTER — Encounter: Payer: Self-pay | Admitting: Family Medicine

## 2017-04-09 ENCOUNTER — Ambulatory Visit: Payer: Medicare Other | Admitting: Dietician

## 2017-04-10 DIAGNOSIS — R002 Palpitations: Secondary | ICD-10-CM | POA: Diagnosis not present

## 2017-04-10 DIAGNOSIS — K5900 Constipation, unspecified: Secondary | ICD-10-CM | POA: Diagnosis not present

## 2017-04-10 DIAGNOSIS — D89811 Chronic graft-versus-host disease: Secondary | ICD-10-CM | POA: Diagnosis not present

## 2017-04-10 DIAGNOSIS — E871 Hypo-osmolality and hyponatremia: Secondary | ICD-10-CM | POA: Diagnosis not present

## 2017-04-10 DIAGNOSIS — R739 Hyperglycemia, unspecified: Secondary | ICD-10-CM | POA: Diagnosis not present

## 2017-04-10 DIAGNOSIS — Z7901 Long term (current) use of anticoagulants: Secondary | ICD-10-CM | POA: Diagnosis not present

## 2017-04-10 DIAGNOSIS — Z794 Long term (current) use of insulin: Secondary | ICD-10-CM | POA: Diagnosis not present

## 2017-04-10 DIAGNOSIS — R5383 Other fatigue: Secondary | ICD-10-CM | POA: Diagnosis not present

## 2017-04-10 DIAGNOSIS — R600 Localized edema: Secondary | ICD-10-CM | POA: Diagnosis not present

## 2017-04-10 DIAGNOSIS — Z5181 Encounter for therapeutic drug level monitoring: Secondary | ICD-10-CM | POA: Diagnosis not present

## 2017-04-10 DIAGNOSIS — K210 Gastro-esophageal reflux disease with esophagitis: Secondary | ICD-10-CM | POA: Diagnosis not present

## 2017-04-10 DIAGNOSIS — D469 Myelodysplastic syndrome, unspecified: Secondary | ICD-10-CM | POA: Diagnosis not present

## 2017-04-10 DIAGNOSIS — T8609 Other complications of bone marrow transplant: Secondary | ICD-10-CM | POA: Diagnosis not present

## 2017-04-10 DIAGNOSIS — C9200 Acute myeloblastic leukemia, not having achieved remission: Secondary | ICD-10-CM | POA: Diagnosis not present

## 2017-04-10 DIAGNOSIS — I82411 Acute embolism and thrombosis of right femoral vein: Secondary | ICD-10-CM | POA: Diagnosis not present

## 2017-04-10 DIAGNOSIS — M1990 Unspecified osteoarthritis, unspecified site: Secondary | ICD-10-CM | POA: Diagnosis not present

## 2017-04-10 DIAGNOSIS — I10 Essential (primary) hypertension: Secondary | ICD-10-CM | POA: Diagnosis not present

## 2017-04-10 DIAGNOSIS — E2740 Unspecified adrenocortical insufficiency: Secondary | ICD-10-CM | POA: Diagnosis not present

## 2017-04-10 DIAGNOSIS — E7800 Pure hypercholesterolemia, unspecified: Secondary | ICD-10-CM | POA: Diagnosis not present

## 2017-04-10 DIAGNOSIS — T380X5A Adverse effect of glucocorticoids and synthetic analogues, initial encounter: Secondary | ICD-10-CM | POA: Diagnosis not present

## 2017-04-10 DIAGNOSIS — I82441 Acute embolism and thrombosis of right tibial vein: Secondary | ICD-10-CM | POA: Diagnosis not present

## 2017-04-10 DIAGNOSIS — Z9484 Stem cells transplant status: Secondary | ICD-10-CM | POA: Diagnosis not present

## 2017-04-10 DIAGNOSIS — T865 Complications of stem cell transplant: Secondary | ICD-10-CM | POA: Diagnosis not present

## 2017-04-10 DIAGNOSIS — E0965 Drug or chemical induced diabetes mellitus with hyperglycemia: Secondary | ICD-10-CM | POA: Diagnosis not present

## 2017-04-17 ENCOUNTER — Other Ambulatory Visit (HOSPITAL_BASED_OUTPATIENT_CLINIC_OR_DEPARTMENT_OTHER): Payer: Medicare Other

## 2017-04-17 DIAGNOSIS — D469 Myelodysplastic syndrome, unspecified: Secondary | ICD-10-CM | POA: Diagnosis not present

## 2017-04-17 DIAGNOSIS — D4621 Refractory anemia with excess of blasts 1: Secondary | ICD-10-CM | POA: Diagnosis present

## 2017-04-17 LAB — COMPREHENSIVE METABOLIC PANEL
ALT: 82 U/L — ABNORMAL HIGH (ref 0–55)
ANION GAP: 9 meq/L (ref 3–11)
AST: 26 U/L (ref 5–34)
Albumin: 3.2 g/dL — ABNORMAL LOW (ref 3.5–5.0)
Alkaline Phosphatase: 163 U/L — ABNORMAL HIGH (ref 40–150)
BILIRUBIN TOTAL: 0.64 mg/dL (ref 0.20–1.20)
BUN: 21.1 mg/dL (ref 7.0–26.0)
CHLORIDE: 104 meq/L (ref 98–109)
CO2: 24 meq/L (ref 22–29)
Calcium: 9.2 mg/dL (ref 8.4–10.4)
Creatinine: 1.3 mg/dL (ref 0.7–1.3)
EGFR: 56 mL/min/{1.73_m2} — AB (ref >60)
Glucose: 124 mg/dl (ref 70–140)
Potassium: 4.3 mEq/L (ref 3.5–5.1)
Sodium: 137 mEq/L (ref 136–145)
TOTAL PROTEIN: 6.1 g/dL — AB (ref 6.4–8.3)

## 2017-04-17 LAB — MAGNESIUM: Magnesium: 2.2 mg/dl (ref 1.5–2.5)

## 2017-04-17 LAB — CBC WITH DIFFERENTIAL/PLATELET
BASO%: 0.3 % (ref 0.0–2.0)
BASOS ABS: 0 10*3/uL (ref 0.0–0.1)
EOS ABS: 0 10*3/uL (ref 0.0–0.5)
EOS%: 0.2 % (ref 0.0–7.0)
HCT: 38.1 % — ABNORMAL LOW (ref 38.4–49.9)
HGB: 12.7 g/dL — ABNORMAL LOW (ref 13.0–17.1)
LYMPH%: 25.5 % (ref 14.0–49.0)
MCH: 33.2 pg (ref 27.2–33.4)
MCHC: 33.4 g/dL (ref 32.0–36.0)
MCV: 99.5 fL — AB (ref 79.3–98.0)
MONO#: 0.9 10*3/uL (ref 0.1–0.9)
MONO%: 11.2 % (ref 0.0–14.0)
NEUT#: 4.9 10*3/uL (ref 1.5–6.5)
NEUT%: 62.8 % (ref 39.0–75.0)
PLATELETS: 214 10*3/uL (ref 140–400)
RBC: 3.83 10*6/uL — ABNORMAL LOW (ref 4.20–5.82)
RDW: 15.6 % — ABNORMAL HIGH (ref 11.0–14.6)
WBC: 7.9 10*3/uL (ref 4.0–10.3)
lymph#: 2 10*3/uL (ref 0.9–3.3)

## 2017-04-19 LAB — TACROLIMUS LEVEL: Tacrolimus (FK506), Blood: 8.4 ng/mL (ref 2.0–20.0)

## 2017-04-22 ENCOUNTER — Encounter: Payer: Self-pay | Admitting: Family Medicine

## 2017-04-24 DIAGNOSIS — D89813 Graft-versus-host disease, unspecified: Secondary | ICD-10-CM | POA: Diagnosis not present

## 2017-04-24 DIAGNOSIS — Z006 Encounter for examination for normal comparison and control in clinical research program: Secondary | ICD-10-CM | POA: Diagnosis not present

## 2017-04-24 DIAGNOSIS — Z7901 Long term (current) use of anticoagulants: Secondary | ICD-10-CM | POA: Diagnosis not present

## 2017-04-24 DIAGNOSIS — R002 Palpitations: Secondary | ICD-10-CM | POA: Diagnosis not present

## 2017-04-24 DIAGNOSIS — E7800 Pure hypercholesterolemia, unspecified: Secondary | ICD-10-CM | POA: Diagnosis not present

## 2017-04-24 DIAGNOSIS — T380X5A Adverse effect of glucocorticoids and synthetic analogues, initial encounter: Secondary | ICD-10-CM | POA: Diagnosis not present

## 2017-04-24 DIAGNOSIS — R5383 Other fatigue: Secondary | ICD-10-CM | POA: Diagnosis not present

## 2017-04-24 DIAGNOSIS — I10 Essential (primary) hypertension: Secondary | ICD-10-CM | POA: Diagnosis not present

## 2017-04-24 DIAGNOSIS — R600 Localized edema: Secondary | ICD-10-CM | POA: Diagnosis not present

## 2017-04-24 DIAGNOSIS — I82441 Acute embolism and thrombosis of right tibial vein: Secondary | ICD-10-CM | POA: Diagnosis not present

## 2017-04-24 DIAGNOSIS — C9200 Acute myeloblastic leukemia, not having achieved remission: Secondary | ICD-10-CM | POA: Diagnosis not present

## 2017-04-24 DIAGNOSIS — M1990 Unspecified osteoarthritis, unspecified site: Secondary | ICD-10-CM | POA: Diagnosis not present

## 2017-04-24 DIAGNOSIS — Z9484 Stem cells transplant status: Secondary | ICD-10-CM | POA: Diagnosis not present

## 2017-04-24 DIAGNOSIS — R739 Hyperglycemia, unspecified: Secondary | ICD-10-CM | POA: Diagnosis not present

## 2017-04-24 DIAGNOSIS — Z794 Long term (current) use of insulin: Secondary | ICD-10-CM | POA: Diagnosis not present

## 2017-04-24 DIAGNOSIS — T865 Complications of stem cell transplant: Secondary | ICD-10-CM | POA: Diagnosis not present

## 2017-04-24 DIAGNOSIS — K210 Gastro-esophageal reflux disease with esophagitis: Secondary | ICD-10-CM | POA: Diagnosis not present

## 2017-04-24 DIAGNOSIS — E2749 Other adrenocortical insufficiency: Secondary | ICD-10-CM | POA: Diagnosis not present

## 2017-04-24 DIAGNOSIS — T451X5A Adverse effect of antineoplastic and immunosuppressive drugs, initial encounter: Secondary | ICD-10-CM | POA: Diagnosis not present

## 2017-04-25 ENCOUNTER — Other Ambulatory Visit: Payer: Self-pay

## 2017-04-25 ENCOUNTER — Telehealth: Payer: Self-pay

## 2017-04-25 DIAGNOSIS — D469 Myelodysplastic syndrome, unspecified: Secondary | ICD-10-CM

## 2017-04-25 NOTE — Telephone Encounter (Signed)
Spoke with pt and scheduled his lab appts for November 7th and 21st both for 0845a. Lab orders in. No further concerns.  Cyndia Bent RN

## 2017-04-30 ENCOUNTER — Encounter: Payer: Self-pay | Admitting: Family Medicine

## 2017-05-01 ENCOUNTER — Other Ambulatory Visit (HOSPITAL_BASED_OUTPATIENT_CLINIC_OR_DEPARTMENT_OTHER): Payer: Medicare Other

## 2017-05-01 DIAGNOSIS — D4621 Refractory anemia with excess of blasts 1: Secondary | ICD-10-CM

## 2017-05-01 DIAGNOSIS — D469 Myelodysplastic syndrome, unspecified: Secondary | ICD-10-CM

## 2017-05-01 LAB — CBC WITH DIFFERENTIAL/PLATELET
BASO%: 0.1 % (ref 0.0–2.0)
Basophils Absolute: 0 10*3/uL (ref 0.0–0.1)
EOS%: 0.4 % (ref 0.0–7.0)
Eosinophils Absolute: 0 10*3/uL (ref 0.0–0.5)
HEMATOCRIT: 39.5 % (ref 38.4–49.9)
HEMOGLOBIN: 12.7 g/dL — AB (ref 13.0–17.1)
LYMPH#: 2.2 10*3/uL (ref 0.9–3.3)
LYMPH%: 26.1 % (ref 14.0–49.0)
MCH: 32.2 pg (ref 27.2–33.4)
MCHC: 32.2 g/dL (ref 32.0–36.0)
MCV: 100.3 fL — ABNORMAL HIGH (ref 79.3–98.0)
MONO#: 0.6 10*3/uL (ref 0.1–0.9)
MONO%: 7.7 % (ref 0.0–14.0)
NEUT#: 5.5 10*3/uL (ref 1.5–6.5)
NEUT%: 65.7 % (ref 39.0–75.0)
PLATELETS: 218 10*3/uL (ref 140–400)
RBC: 3.94 10*6/uL — ABNORMAL LOW (ref 4.20–5.82)
RDW: 14.9 % — AB (ref 11.0–14.6)
WBC: 8.3 10*3/uL (ref 4.0–10.3)

## 2017-05-01 LAB — COMPREHENSIVE METABOLIC PANEL
ALBUMIN: 3.1 g/dL — AB (ref 3.5–5.0)
ALK PHOS: 182 U/L — AB (ref 40–150)
ALT: 75 U/L — ABNORMAL HIGH (ref 0–55)
ANION GAP: 8 meq/L (ref 3–11)
AST: 24 U/L (ref 5–34)
BILIRUBIN TOTAL: 0.52 mg/dL (ref 0.20–1.20)
BUN: 18.1 mg/dL (ref 7.0–26.0)
CO2: 25 meq/L (ref 22–29)
Calcium: 9.2 mg/dL (ref 8.4–10.4)
Chloride: 104 mEq/L (ref 98–109)
Creatinine: 1.3 mg/dL (ref 0.7–1.3)
EGFR: 58 mL/min/{1.73_m2} — AB (ref >60)
Glucose: 137 mg/dl (ref 70–140)
POTASSIUM: 4.5 meq/L (ref 3.5–5.1)
SODIUM: 137 meq/L (ref 136–145)
TOTAL PROTEIN: 6.2 g/dL — AB (ref 6.4–8.3)

## 2017-05-01 LAB — MAGNESIUM: MAGNESIUM: 1.9 mg/dL (ref 1.5–2.5)

## 2017-05-03 LAB — TACROLIMUS LEVEL: TACROLIMUS LVL: 11.5 ng/mL (ref 2.0–20.0)

## 2017-05-07 ENCOUNTER — Encounter: Payer: Self-pay | Admitting: Family Medicine

## 2017-05-08 ENCOUNTER — Telehealth: Payer: Self-pay | Admitting: *Deleted

## 2017-05-08 DIAGNOSIS — I119 Hypertensive heart disease without heart failure: Secondary | ICD-10-CM | POA: Diagnosis not present

## 2017-05-08 DIAGNOSIS — R739 Hyperglycemia, unspecified: Secondary | ICD-10-CM | POA: Diagnosis not present

## 2017-05-08 DIAGNOSIS — D4620 Refractory anemia with excess of blasts, unspecified: Secondary | ICD-10-CM | POA: Diagnosis not present

## 2017-05-08 DIAGNOSIS — T380X5D Adverse effect of glucocorticoids and synthetic analogues, subsequent encounter: Secondary | ICD-10-CM | POA: Diagnosis not present

## 2017-05-08 DIAGNOSIS — E2749 Other adrenocortical insufficiency: Secondary | ICD-10-CM | POA: Diagnosis not present

## 2017-05-08 DIAGNOSIS — R5383 Other fatigue: Secondary | ICD-10-CM | POA: Diagnosis not present

## 2017-05-08 DIAGNOSIS — I82441 Acute embolism and thrombosis of right tibial vein: Secondary | ICD-10-CM | POA: Diagnosis not present

## 2017-05-08 DIAGNOSIS — Z7901 Long term (current) use of anticoagulants: Secondary | ICD-10-CM | POA: Diagnosis not present

## 2017-05-08 DIAGNOSIS — Z9484 Stem cells transplant status: Secondary | ICD-10-CM | POA: Diagnosis not present

## 2017-05-08 DIAGNOSIS — D89813 Graft-versus-host disease, unspecified: Secondary | ICD-10-CM | POA: Diagnosis not present

## 2017-05-08 DIAGNOSIS — E7800 Pure hypercholesterolemia, unspecified: Secondary | ICD-10-CM | POA: Diagnosis not present

## 2017-05-08 DIAGNOSIS — M1990 Unspecified osteoarthritis, unspecified site: Secondary | ICD-10-CM | POA: Diagnosis not present

## 2017-05-08 DIAGNOSIS — R002 Palpitations: Secondary | ICD-10-CM | POA: Diagnosis not present

## 2017-05-08 DIAGNOSIS — R9431 Abnormal electrocardiogram [ECG] [EKG]: Secondary | ICD-10-CM | POA: Diagnosis not present

## 2017-05-08 DIAGNOSIS — I499 Cardiac arrhythmia, unspecified: Secondary | ICD-10-CM | POA: Diagnosis not present

## 2017-05-08 DIAGNOSIS — K219 Gastro-esophageal reflux disease without esophagitis: Secondary | ICD-10-CM | POA: Diagnosis not present

## 2017-05-08 DIAGNOSIS — T8609 Other complications of bone marrow transplant: Secondary | ICD-10-CM | POA: Diagnosis not present

## 2017-05-08 NOTE — Telephone Encounter (Signed)
FYI  "I need to reschedule the 05-15-2017 lab appointment two weeks out on 05-29-2017 as.early in  the morning as possible.  It's just a lab.  This one will be replaced at [facility].  I'll check Mychart for appointment information."  Scheduling message sent with this request.

## 2017-05-08 NOTE — Telephone Encounter (Signed)
Changed labs to 05/29/17 as requested.

## 2017-05-15 ENCOUNTER — Other Ambulatory Visit: Payer: Medicare Other

## 2017-05-15 DIAGNOSIS — R5382 Chronic fatigue, unspecified: Secondary | ICD-10-CM | POA: Diagnosis not present

## 2017-05-15 DIAGNOSIS — E2749 Other adrenocortical insufficiency: Secondary | ICD-10-CM | POA: Diagnosis not present

## 2017-05-15 DIAGNOSIS — Z5181 Encounter for therapeutic drug level monitoring: Secondary | ICD-10-CM | POA: Diagnosis not present

## 2017-05-15 DIAGNOSIS — M1990 Unspecified osteoarthritis, unspecified site: Secondary | ICD-10-CM | POA: Diagnosis not present

## 2017-05-15 DIAGNOSIS — R9431 Abnormal electrocardiogram [ECG] [EKG]: Secondary | ICD-10-CM | POA: Diagnosis not present

## 2017-05-15 DIAGNOSIS — K219 Gastro-esophageal reflux disease without esophagitis: Secondary | ICD-10-CM | POA: Diagnosis not present

## 2017-05-15 DIAGNOSIS — I10 Essential (primary) hypertension: Secondary | ICD-10-CM | POA: Diagnosis not present

## 2017-05-15 DIAGNOSIS — I499 Cardiac arrhythmia, unspecified: Secondary | ICD-10-CM | POA: Diagnosis not present

## 2017-05-15 DIAGNOSIS — R5383 Other fatigue: Secondary | ICD-10-CM | POA: Diagnosis not present

## 2017-05-15 DIAGNOSIS — Z9481 Bone marrow transplant status: Secondary | ICD-10-CM | POA: Diagnosis not present

## 2017-05-15 DIAGNOSIS — D469 Myelodysplastic syndrome, unspecified: Secondary | ICD-10-CM | POA: Diagnosis not present

## 2017-05-15 DIAGNOSIS — R002 Palpitations: Secondary | ICD-10-CM | POA: Diagnosis not present

## 2017-05-15 DIAGNOSIS — D89811 Chronic graft-versus-host disease: Secondary | ICD-10-CM | POA: Diagnosis not present

## 2017-05-15 DIAGNOSIS — E7800 Pure hypercholesterolemia, unspecified: Secondary | ICD-10-CM | POA: Diagnosis not present

## 2017-05-15 DIAGNOSIS — Z9484 Stem cells transplant status: Secondary | ICD-10-CM | POA: Diagnosis not present

## 2017-05-15 DIAGNOSIS — C9200 Acute myeloblastic leukemia, not having achieved remission: Secondary | ICD-10-CM | POA: Diagnosis not present

## 2017-05-15 DIAGNOSIS — R739 Hyperglycemia, unspecified: Secondary | ICD-10-CM | POA: Diagnosis not present

## 2017-05-15 DIAGNOSIS — T380X5A Adverse effect of glucocorticoids and synthetic analogues, initial encounter: Secondary | ICD-10-CM | POA: Diagnosis not present

## 2017-05-15 DIAGNOSIS — T865 Complications of stem cell transplant: Secondary | ICD-10-CM | POA: Diagnosis not present

## 2017-05-15 DIAGNOSIS — T451X5A Adverse effect of antineoplastic and immunosuppressive drugs, initial encounter: Secondary | ICD-10-CM | POA: Diagnosis not present

## 2017-05-15 DIAGNOSIS — I82441 Acute embolism and thrombosis of right tibial vein: Secondary | ICD-10-CM | POA: Diagnosis not present

## 2017-05-15 DIAGNOSIS — Z794 Long term (current) use of insulin: Secondary | ICD-10-CM | POA: Diagnosis not present

## 2017-05-15 DIAGNOSIS — R600 Localized edema: Secondary | ICD-10-CM | POA: Diagnosis not present

## 2017-05-15 DIAGNOSIS — Z79899 Other long term (current) drug therapy: Secondary | ICD-10-CM | POA: Diagnosis not present

## 2017-05-15 DIAGNOSIS — T8609 Other complications of bone marrow transplant: Secondary | ICD-10-CM | POA: Diagnosis not present

## 2017-05-15 DIAGNOSIS — Z7901 Long term (current) use of anticoagulants: Secondary | ICD-10-CM | POA: Diagnosis not present

## 2017-05-29 ENCOUNTER — Other Ambulatory Visit (HOSPITAL_BASED_OUTPATIENT_CLINIC_OR_DEPARTMENT_OTHER): Payer: Medicare Other

## 2017-05-29 ENCOUNTER — Encounter: Payer: Self-pay | Admitting: Family Medicine

## 2017-05-29 DIAGNOSIS — D469 Myelodysplastic syndrome, unspecified: Secondary | ICD-10-CM

## 2017-05-29 DIAGNOSIS — D4621 Refractory anemia with excess of blasts 1: Secondary | ICD-10-CM

## 2017-05-29 LAB — COMPREHENSIVE METABOLIC PANEL
ALT: 89 U/L — AB (ref 0–55)
ANION GAP: 9 meq/L (ref 3–11)
AST: 28 U/L (ref 5–34)
Albumin: 3.4 g/dL — ABNORMAL LOW (ref 3.5–5.0)
Alkaline Phosphatase: 183 U/L — ABNORMAL HIGH (ref 40–150)
BUN: 13.9 mg/dL (ref 7.0–26.0)
CHLORIDE: 106 meq/L (ref 98–109)
CO2: 24 meq/L (ref 22–29)
CREATININE: 1.3 mg/dL (ref 0.7–1.3)
Calcium: 9.5 mg/dL (ref 8.4–10.4)
EGFR: 58 mL/min/{1.73_m2} — ABNORMAL LOW (ref >60)
GLUCOSE: 110 mg/dL (ref 70–140)
Potassium: 4.3 mEq/L (ref 3.5–5.1)
SODIUM: 139 meq/L (ref 136–145)
Total Bilirubin: 0.46 mg/dL (ref 0.20–1.20)
Total Protein: 6.3 g/dL — ABNORMAL LOW (ref 6.4–8.3)

## 2017-05-29 LAB — CBC WITH DIFFERENTIAL/PLATELET
BASO%: 0.2 % (ref 0.0–2.0)
BASOS ABS: 0 10*3/uL (ref 0.0–0.1)
EOS ABS: 0.1 10*3/uL (ref 0.0–0.5)
EOS%: 1.1 % (ref 0.0–7.0)
HEMATOCRIT: 39.8 % (ref 38.4–49.9)
HEMOGLOBIN: 13.2 g/dL (ref 13.0–17.1)
LYMPH%: 31.9 % (ref 14.0–49.0)
MCH: 32.5 pg (ref 27.2–33.4)
MCHC: 33.2 g/dL (ref 32.0–36.0)
MCV: 98 fL (ref 79.3–98.0)
MONO#: 0.5 10*3/uL (ref 0.1–0.9)
MONO%: 7 % (ref 0.0–14.0)
NEUT#: 3.9 10*3/uL (ref 1.5–6.5)
NEUT%: 59.8 % (ref 39.0–75.0)
Platelets: 196 10*3/uL (ref 140–400)
RBC: 4.06 10*6/uL — ABNORMAL LOW (ref 4.20–5.82)
RDW: 14.8 % — AB (ref 11.0–14.6)
WBC: 6.6 10*3/uL (ref 4.0–10.3)
lymph#: 2.1 10*3/uL (ref 0.9–3.3)

## 2017-05-29 LAB — MAGNESIUM: Magnesium: 1.9 mg/dl (ref 1.5–2.5)

## 2017-05-31 LAB — TACROLIMUS LEVEL: TACROLIMUS LVL: 6 ng/mL (ref 2.0–20.0)

## 2017-06-05 DIAGNOSIS — Z794 Long term (current) use of insulin: Secondary | ICD-10-CM | POA: Diagnosis not present

## 2017-06-05 DIAGNOSIS — Z006 Encounter for examination for normal comparison and control in clinical research program: Secondary | ICD-10-CM | POA: Diagnosis not present

## 2017-06-05 DIAGNOSIS — T380X5S Adverse effect of glucocorticoids and synthetic analogues, sequela: Secondary | ICD-10-CM | POA: Diagnosis not present

## 2017-06-05 DIAGNOSIS — Z48298 Encounter for aftercare following other organ transplant: Secondary | ICD-10-CM | POA: Diagnosis not present

## 2017-06-05 DIAGNOSIS — I10 Essential (primary) hypertension: Secondary | ICD-10-CM | POA: Diagnosis not present

## 2017-06-05 DIAGNOSIS — D89813 Graft-versus-host disease, unspecified: Secondary | ICD-10-CM | POA: Diagnosis not present

## 2017-06-05 DIAGNOSIS — Z79899 Other long term (current) drug therapy: Secondary | ICD-10-CM | POA: Diagnosis not present

## 2017-06-05 DIAGNOSIS — Z9481 Bone marrow transplant status: Secondary | ICD-10-CM | POA: Diagnosis not present

## 2017-06-05 DIAGNOSIS — Z7901 Long term (current) use of anticoagulants: Secondary | ICD-10-CM | POA: Diagnosis not present

## 2017-06-05 DIAGNOSIS — R5382 Chronic fatigue, unspecified: Secondary | ICD-10-CM | POA: Diagnosis not present

## 2017-06-05 DIAGNOSIS — R739 Hyperglycemia, unspecified: Secondary | ICD-10-CM | POA: Diagnosis not present

## 2017-06-05 DIAGNOSIS — R001 Bradycardia, unspecified: Secondary | ICD-10-CM | POA: Diagnosis not present

## 2017-06-05 DIAGNOSIS — Z9484 Stem cells transplant status: Secondary | ICD-10-CM | POA: Diagnosis not present

## 2017-06-05 DIAGNOSIS — I82441 Acute embolism and thrombosis of right tibial vein: Secondary | ICD-10-CM | POA: Diagnosis not present

## 2017-06-05 DIAGNOSIS — D469 Myelodysplastic syndrome, unspecified: Secondary | ICD-10-CM | POA: Diagnosis not present

## 2017-06-05 DIAGNOSIS — E2749 Other adrenocortical insufficiency: Secondary | ICD-10-CM | POA: Diagnosis not present

## 2017-06-05 DIAGNOSIS — Z5181 Encounter for therapeutic drug level monitoring: Secondary | ICD-10-CM | POA: Diagnosis not present

## 2017-06-05 DIAGNOSIS — C9200 Acute myeloblastic leukemia, not having achieved remission: Secondary | ICD-10-CM | POA: Diagnosis not present

## 2017-06-11 ENCOUNTER — Encounter: Payer: Self-pay | Admitting: Family Medicine

## 2017-06-12 ENCOUNTER — Telehealth: Payer: Self-pay

## 2017-06-12 ENCOUNTER — Other Ambulatory Visit: Payer: Self-pay

## 2017-06-12 DIAGNOSIS — D469 Myelodysplastic syndrome, unspecified: Secondary | ICD-10-CM

## 2017-06-12 NOTE — Telephone Encounter (Signed)
Returned pt VM regarding lab appt. Pt scheduled Thursday December 27th. 8:45a. Pt aware of time and date. Orders placed. No further needs at this time.  Cyndia Bent RN

## 2017-06-20 ENCOUNTER — Encounter: Payer: Self-pay | Admitting: Family Medicine

## 2017-06-20 ENCOUNTER — Other Ambulatory Visit (HOSPITAL_BASED_OUTPATIENT_CLINIC_OR_DEPARTMENT_OTHER): Payer: Medicare Other

## 2017-06-20 DIAGNOSIS — D4621 Refractory anemia with excess of blasts 1: Secondary | ICD-10-CM

## 2017-06-20 DIAGNOSIS — D469 Myelodysplastic syndrome, unspecified: Secondary | ICD-10-CM

## 2017-06-20 LAB — COMPREHENSIVE METABOLIC PANEL
ALBUMIN: 3.2 g/dL — AB (ref 3.5–5.0)
ALK PHOS: 164 U/L — AB (ref 40–150)
ALT: 78 U/L — ABNORMAL HIGH (ref 0–55)
ANION GAP: 10 meq/L (ref 3–11)
AST: 26 U/L (ref 5–34)
BUN: 15.9 mg/dL (ref 7.0–26.0)
CALCIUM: 9.3 mg/dL (ref 8.4–10.4)
CHLORIDE: 105 meq/L (ref 98–109)
CO2: 23 mEq/L (ref 22–29)
Creatinine: 1.2 mg/dL (ref 0.7–1.3)
Glucose: 102 mg/dl (ref 70–140)
POTASSIUM: 4.3 meq/L (ref 3.5–5.1)
Sodium: 138 mEq/L (ref 136–145)
Total Bilirubin: 0.4 mg/dL (ref 0.20–1.20)
Total Protein: 6 g/dL — ABNORMAL LOW (ref 6.4–8.3)

## 2017-06-20 LAB — CBC & DIFF AND RETIC
BASO%: 0.2 % (ref 0.0–2.0)
Basophils Absolute: 0 10*3/uL (ref 0.0–0.1)
EOS%: 1.5 % (ref 0.0–7.0)
Eosinophils Absolute: 0.1 10*3/uL (ref 0.0–0.5)
HCT: 37.9 % — ABNORMAL LOW (ref 38.4–49.9)
HGB: 12.6 g/dL — ABNORMAL LOW (ref 13.0–17.1)
IMMATURE RETIC FRACT: 13.5 % — AB (ref 3.00–10.60)
LYMPH#: 1.5 10*3/uL (ref 0.9–3.3)
LYMPH%: 24.9 % (ref 14.0–49.0)
MCH: 32.3 pg (ref 27.2–33.4)
MCHC: 33.2 g/dL (ref 32.0–36.0)
MCV: 97.2 fL (ref 79.3–98.0)
MONO#: 0.6 10*3/uL (ref 0.1–0.9)
MONO%: 10.2 % (ref 0.0–14.0)
NEUT%: 63.2 % (ref 39.0–75.0)
NEUTROS ABS: 3.9 10*3/uL (ref 1.5–6.5)
Platelets: 182 10*3/uL (ref 140–400)
RBC: 3.9 10*6/uL — AB (ref 4.20–5.82)
RDW: 15.2 % — AB (ref 11.0–14.6)
RETIC %: 2.37 % — AB (ref 0.80–1.80)
Retic Ct Abs: 92.43 10*3/uL (ref 34.80–93.90)
WBC: 6.1 10*3/uL (ref 4.0–10.3)

## 2017-06-20 LAB — MAGNESIUM: Magnesium: 1.9 mg/dl (ref 1.5–2.5)

## 2017-06-22 LAB — TACROLIMUS LEVEL: Tacrolimus (FK506), Blood: 4.6 ng/mL (ref 2.0–20.0)

## 2017-07-02 ENCOUNTER — Encounter: Payer: Self-pay | Admitting: Family Medicine

## 2017-07-03 LAB — BASIC METABOLIC PANEL
BUN: 21 (ref 4–21)
CREATININE: 1.4 — AB (ref 0.6–1.3)
Glucose: 133
Sodium: 132 — AB (ref 137–147)

## 2017-07-03 LAB — HEPATIC FUNCTION PANEL: BILIRUBIN, TOTAL: 0.5

## 2017-07-03 LAB — CBC AND DIFFERENTIAL: WBC: 10.1

## 2017-07-06 ENCOUNTER — Other Ambulatory Visit: Payer: Self-pay

## 2017-07-06 ENCOUNTER — Emergency Department (HOSPITAL_COMMUNITY): Payer: Medicare Other

## 2017-07-06 ENCOUNTER — Inpatient Hospital Stay (HOSPITAL_COMMUNITY)
Admission: EM | Admit: 2017-07-06 | Discharge: 2017-07-09 | DRG: 872 | Disposition: A | Discharge status: Home / Self Care | Payer: Medicare Other | Attending: Internal Medicine | Admitting: Internal Medicine

## 2017-07-06 ENCOUNTER — Encounter (HOSPITAL_COMMUNITY): Payer: Self-pay

## 2017-07-06 DIAGNOSIS — Z23 Encounter for immunization: Secondary | ICD-10-CM

## 2017-07-06 DIAGNOSIS — E860 Dehydration: Secondary | ICD-10-CM

## 2017-07-06 DIAGNOSIS — N179 Acute kidney failure, unspecified: Secondary | ICD-10-CM | POA: Diagnosis present

## 2017-07-06 DIAGNOSIS — Z794 Long term (current) use of insulin: Secondary | ICD-10-CM

## 2017-07-06 DIAGNOSIS — I10 Essential (primary) hypertension: Secondary | ICD-10-CM | POA: Diagnosis present

## 2017-07-06 DIAGNOSIS — R7989 Other specified abnormal findings of blood chemistry: Secondary | ICD-10-CM

## 2017-07-06 DIAGNOSIS — I959 Hypotension, unspecified: Secondary | ICD-10-CM | POA: Diagnosis present

## 2017-07-06 DIAGNOSIS — Z79899 Other long term (current) drug therapy: Secondary | ICD-10-CM

## 2017-07-06 DIAGNOSIS — Z9484 Stem cells transplant status: Secondary | ICD-10-CM

## 2017-07-06 DIAGNOSIS — T380X5A Adverse effect of glucocorticoids and synthetic analogues, initial encounter: Secondary | ICD-10-CM | POA: Diagnosis present

## 2017-07-06 DIAGNOSIS — Z86718 Personal history of other venous thrombosis and embolism: Secondary | ICD-10-CM

## 2017-07-06 DIAGNOSIS — D469 Myelodysplastic syndrome, unspecified: Secondary | ICD-10-CM | POA: Diagnosis present

## 2017-07-06 DIAGNOSIS — R509 Fever, unspecified: Secondary | ICD-10-CM

## 2017-07-06 DIAGNOSIS — E0965 Drug or chemical induced diabetes mellitus with hyperglycemia: Secondary | ICD-10-CM | POA: Diagnosis present

## 2017-07-06 DIAGNOSIS — A419 Sepsis, unspecified organism: Secondary | ICD-10-CM | POA: Diagnosis not present

## 2017-07-06 DIAGNOSIS — D89813 Graft-versus-host disease, unspecified: Secondary | ICD-10-CM | POA: Diagnosis present

## 2017-07-06 DIAGNOSIS — R739 Hyperglycemia, unspecified: Secondary | ICD-10-CM | POA: Diagnosis present

## 2017-07-06 DIAGNOSIS — E273 Drug-induced adrenocortical insufficiency: Secondary | ICD-10-CM | POA: Diagnosis present

## 2017-07-06 DIAGNOSIS — D84821 Immunodeficiency due to drugs: Secondary | ICD-10-CM

## 2017-07-06 LAB — CBC WITH DIFFERENTIAL/PLATELET
BASOS ABS: 0 10*3/uL (ref 0.0–0.1)
Basophils Relative: 0 %
Eosinophils Absolute: 0 10*3/uL (ref 0.0–0.7)
Eosinophils Relative: 0 %
HCT: 42 % (ref 39.0–52.0)
Hemoglobin: 14.4 g/dL (ref 13.0–17.0)
LYMPHS ABS: 0.9 10*3/uL (ref 0.7–4.0)
Lymphocytes Relative: 8 %
MCH: 32.5 pg (ref 26.0–34.0)
MCHC: 34.3 g/dL (ref 30.0–36.0)
MCV: 94.8 fL (ref 78.0–100.0)
MONO ABS: 0.8 10*3/uL (ref 0.1–1.0)
Monocytes Relative: 7 %
NEUTROS PCT: 85 %
Neutro Abs: 9.8 10*3/uL — ABNORMAL HIGH (ref 1.7–7.7)
PLATELETS: 349 10*3/uL (ref 150–400)
RBC: 4.43 MIL/uL (ref 4.22–5.81)
RDW: 14.9 % (ref 11.5–15.5)
WBC: 11.5 10*3/uL — AB (ref 4.0–10.5)

## 2017-07-06 LAB — URINALYSIS, ROUTINE W REFLEX MICROSCOPIC
Glucose, UA: NEGATIVE mg/dL
Hgb urine dipstick: NEGATIVE
Ketones, ur: 5 mg/dL — AB
LEUKOCYTES UA: NEGATIVE
NITRITE: NEGATIVE
Protein, ur: 100 mg/dL — AB
SPECIFIC GRAVITY, URINE: 1.021 (ref 1.005–1.030)
pH: 5 (ref 5.0–8.0)

## 2017-07-06 LAB — COMPREHENSIVE METABOLIC PANEL
ALT: 41 U/L (ref 17–63)
AST: 32 U/L (ref 15–41)
Albumin: 3.7 g/dL (ref 3.5–5.0)
Alkaline Phosphatase: 176 U/L — ABNORMAL HIGH (ref 38–126)
Anion gap: 12 (ref 5–15)
BILIRUBIN TOTAL: 0.7 mg/dL (ref 0.3–1.2)
BUN: 28 mg/dL — AB (ref 6–20)
CO2: 19 mmol/L — ABNORMAL LOW (ref 22–32)
CREATININE: 2.48 mg/dL — AB (ref 0.61–1.24)
Calcium: 8.9 mg/dL (ref 8.9–10.3)
Chloride: 105 mmol/L (ref 101–111)
GFR, EST AFRICAN AMERICAN: 29 mL/min — AB (ref >60)
GFR, EST NON AFRICAN AMERICAN: 25 mL/min — AB (ref >60)
Glucose, Bld: 153 mg/dL — ABNORMAL HIGH (ref 65–99)
Potassium: 4.3 mmol/L (ref 3.5–5.1)
Sodium: 136 mmol/L (ref 135–145)
TOTAL PROTEIN: 6.7 g/dL (ref 6.5–8.1)

## 2017-07-06 LAB — INFLUENZA PANEL BY PCR (TYPE A & B)
INFLAPCR: NEGATIVE
INFLBPCR: NEGATIVE

## 2017-07-06 LAB — I-STAT CG4 LACTIC ACID, ED
LACTIC ACID, VENOUS: 1.95 mmol/L — AB (ref 0.5–1.9)
Lactic Acid, Venous: 3.83 mmol/L (ref 0.5–1.9)

## 2017-07-06 MED ORDER — HYDROCORTISONE NA SUCCINATE PF 100 MG IJ SOLR
100.0000 mg | Freq: Once | INTRAMUSCULAR | Status: AC
Start: 2017-07-07 — End: 2017-07-07
  Administered 2017-07-07: 100 mg via INTRAVENOUS
  Filled 2017-07-06: qty 2

## 2017-07-06 MED ORDER — SODIUM CHLORIDE 0.9 % IV BOLUS (SEPSIS)
1000.0000 mL | Freq: Once | INTRAVENOUS | Status: AC
  Administered 2017-07-06: 1000 mL via INTRAVENOUS

## 2017-07-06 MED ORDER — ONDANSETRON HCL 4 MG/2ML IJ SOLN
4.0000 mg | Freq: Once | INTRAMUSCULAR | Status: AC
  Administered 2017-07-06: 4 mg via INTRAVENOUS
  Filled 2017-07-06: qty 2

## 2017-07-06 MED ORDER — HYDROCORTISONE NA SUCCINATE PF 100 MG IJ SOLR: 50.0000 mg | Freq: Three times a day (TID) | INTRAMUSCULAR | Status: DC

## 2017-07-06 MED ORDER — DEXTROSE 5 % IV SOLN
2.0000 g | Freq: Once | INTRAVENOUS | Status: AC
  Administered 2017-07-06: 2 g via INTRAVENOUS
  Filled 2017-07-06: qty 2

## 2017-07-06 MED ORDER — VANCOMYCIN HCL IN DEXTROSE 1-5 GM/200ML-% IV SOLN
1000.0000 mg | Freq: Once | INTRAVENOUS | Status: AC
  Administered 2017-07-06: 1000 mg via INTRAVENOUS
  Filled 2017-07-06: qty 200

## 2017-07-06 MED ORDER — TACROLIMUS 0.5 MG PO CAPS
0.5000 mg | ORAL_CAPSULE | Freq: Two times a day (BID) | ORAL | Status: DC
  Filled 2017-07-06: qty 1

## 2017-07-06 NOTE — ED Notes (Signed)
Pt does not feel ready to try for UA

## 2017-07-06 NOTE — ED Provider Notes (Addendum)
[place] DEPT Provider Note   CSN: [id number] Arrival date & time: 07/06/17  1907     History   Chief Complaint Chief Complaint  Patient presents with  . Nausea  . Fever  . Chills  . Weakness    HPI Brandon [name] is a 67 y.o. male.  Patient with hx MDS (vs ?AML) s/p stem cell transplant 14 month ago, presents c/o onset fevers today, 101, chills, generalized weakness, nausea and vomiting, body aches. No known ill contacts. Denies cough or sore throat. No headache, no neck pain or stiffness. No chest pain. No abd pain. No dysuria or gu c/o. Is on immunosuppressive therapy, and has been on steroids chronically (now on 1 mg). Symptoms persistent since onset earlier today, moderate.    The history is provided by the patient.  Fever   Associated symptoms include vomiting. Pertinent negatives include no chest pain, no headaches, no sore throat and no cough.  Weakness  Associated symptoms include vomiting. Pertinent negatives include no shortness of breath, no chest pain, no confusion and no headaches.    Past Medical History:  Diagnosis Date  . Allergy    seasonal/animals  . Chronic prostatitis 12/17/2008  . Diverticulosis 01/16/2011   History of diverticulitis   . ERECTILE DYSFUNCTION 02/07/2007  . HYPERLIPIDEMIA 02/04/2007  . HYPERTENSION 02/04/2007  . NEOPLASM, MALIGNANT, SKIN, BACK 02/09/2009  . S/P bone marrow transplant [facility])     Patient Active Problem List   Diagnosis Date Noted  . History of stem cell transplant ([place]) 03/04/2017  . Deep vein thrombosis (DVT) of femoral vein of right lower extremity ([place]) 03/04/2017  . GVHD (graft versus host disease) ([place]) 03/04/2017  . MDS (myelodysplastic syndrome) ([place]) 12/06/2015  . BPH associated with nocturia 11/15/2014  . Leukopenia 11/15/2014  . Steroid-induced hyperglycemia 08/16/2014  . Chest pain 08/13/2014  . GERD (gastroesophageal reflux disease) 05/03/2014  . Cervical disc disorder  with radiculopathy of cervical region 07/14/2010  . MIXED HEARING LOSS BILATERAL 07/14/2010  . ERECTILE DYSFUNCTION 02/07/2007  . Hyperlipidemia 02/04/2007  . Essential hypertension 02/04/2007    Past Surgical History:  Procedure Laterality Date  . none         Home Medications    Prior to Admission medications   Medication Sig Start Date End Date Taking? Authorizing Provider  acyclovir (ZOVIRAX) 400 MG tablet Take 400 mg by mouth 2 (two) times daily.  07/13/16 07/13/17  [provider]  atenolol (TENORMIN) 50 MG tablet Take 1 tablet (50 mg total) by mouth daily. Patient taking differently: Take 25 mg by mouth every evening.  12/09/15   [name], MD  cholecalciferol (VITAMIN D) 1000 units tablet Take 1,000 Units by mouth daily.    [provider]  enoxaparin (LOVENOX) 80 MG/0.8ML injection Inject 80 mg into the skin.    [provider]  fluticasone Asencion Islam) 50 MCG/ACT nasal spray  05/10/16   [provider]  folic acid (FOLVITE) 825 MCG tablet Take 400 mcg by mouth 2 (two) times daily.    [provider]  HYDROCORTISONE PO Take 10 mg by mouth. Take 2 tablets in AM and 1 tablet in PM    [provider]  insulin detemir (LEVEMIR) 100 UNIT/ML injection Inject 10 Units into the skin at bedtime.    [provider]  insulin regular (NOVOLIN R,HUMULIN R) 100 units/mL injection Inject into the skin 3 (three) times daily before meals. Sliding scale    [provider]  loratadine (CLARITIN) 10 MG tablet Take 10 mg by mouth daily as needed.    [provider]  LORazepam (ATIVAN) 0.5 MG tablet Take 0.5 mg by mouth as needed. 05/10/16   [provider]  Magnesium Oxide 400 MG CAPS Take 1 capsule (400 mg total) by mouth 5 (five) times daily. 09/03/16   Nicholas Lose, MD  omeprazole (PRILOSEC OTC) 20 MG tablet Take 2 tablets (40 mg total) by mouth 2 (two) times daily. take 1 tablet by mouth once daily  09/03/16   Nicholas Lose, MD  polyethylene glycol (MIRALAX / GLYCOLAX) packet Take 17 g by mouth daily as needed.    [provider]  posaconazole (NOXAFIL) 100 MG TBEC delayed-release tablet Take 300 mg by mouth daily. 06/03/16   [provider]  predniSONE (DELTASONE) 20 MG tablet Take 20 mg by mouth 3 (three) times daily.    [provider]  senna-docusate (SENOKOT-S) 8.6-50 MG tablet Take 1 tablet by mouth Nightly.    [provider]  sulfamethoxazole-trimethoprim (BACTRIM,SEPTRA) 400-80 MG tablet Take 1 tablet by mouth daily. 07/12/16   [provider]  tacrolimus (PROGRAF) 0.5 MG capsule Take 0.5 mg by mouth daily. 06/12/16   [provider]    Family History Family History  Problem Relation Age of Onset  . Breast cancer Mother   . CVA Mother        quit taking HTN meds  . Alzheimer's disease Father        late 14s. mid 42s  . Asperger's syndrome Son     Social History Social History   Tobacco Use  . Smoking status: Never Smoker  . Smokeless tobacco: Never Used  Substance Use Topics  . Alcohol use: Yes    Alcohol/week: 1.8 oz    Types: 3 Standard drinks or equivalent per week  . Drug use: No     Allergies   Patient has no known allergies.   Review of Systems Review of Systems  Constitutional: Positive for fever.  HENT: Negative for sore throat.   Eyes: Negative for redness.  Respiratory: Negative for cough and shortness of breath.   Cardiovascular: Negative for chest pain.  Gastrointestinal: Positive for nausea and vomiting. Negative for abdominal pain.  Endocrine: Negative for polyuria.  Genitourinary: Negative for dysuria and flank pain.  Musculoskeletal: Negative for back pain, neck pain and neck stiffness.  Skin: Negative for rash.  Neurological: Positive for weakness. Negative for headaches.  Hematological: Does not bruise/bleed easily.  Psychiatric/Behavioral: Negative for confusion.     Physical  Exam Updated Vital Signs BP 100/69   Pulse (!) 102   Temp 99.4 F (37.4 C) (Oral)   Resp (!) 32   Ht 1.778 m (5\' 10" )   Wt 93 kg (205 lb)   SpO2 97%   BMI 29.41 kg/m   Physical Exam  Constitutional: He appears well-developed and well-nourished. No distress.  HENT:  Mouth/Throat: Oropharynx is clear and moist.  Eyes: Conjunctivae are normal. Pupils are equal, round, and reactive to light.  Neck: Neck supple. No tracheal deviation present. No thyromegaly present.  No stiffness or rigidity.  Cardiovascular: Regular rhythm, normal heart sounds and intact distal pulses. Exam reveals no gallop and no friction rub.  No murmur heard. Mildly tachycardic.   Pulmonary/Chest: Effort normal and breath sounds normal. No accessory muscle usage. No respiratory distress.  Abdominal: Soft. Bowel sounds are normal. He exhibits no distension. There is no tenderness.  Genitourinary:  Genitourinary Comments: No cva  tenderness.  Musculoskeletal: He exhibits no edema.  Neurological: He is alert.  Skin: Skin is warm and dry. No rash noted. He is not diaphoretic.  Psychiatric: He has a normal mood and affect.  Nursing note and vitals reviewed.    ED Treatments / Results  Labs (all labs ordered are listed, but only abnormal results are displayed) Results for orders placed or performed during the hospital encounter of 07/06/17  Comprehensive metabolic panel  Result Value Ref Range   Sodium 136 135 - 145 mmol/L   Potassium 4.3 3.5 - 5.1 mmol/L   Chloride 105 101 - 111 mmol/L   CO2 19 (L) 22 - 32 mmol/L   Glucose, Bld 153 (H) 65 - 99 mg/dL   BUN 28 (H) 6 - 20 mg/dL   Creatinine, Ser 2.48 (H) 0.61 - 1.24 mg/dL   Calcium 8.9 8.9 - 10.3 mg/dL   Total Protein 6.7 6.5 - 8.1 g/dL   Albumin 3.7 3.5 - 5.0 g/dL   AST 32 15 - 41 U/L   ALT 41 17 - 63 U/L   Alkaline Phosphatase 176 (H) 38 - 126 U/L   Total Bilirubin 0.7 0.3 - 1.2 mg/dL   GFR calc non Af Amer 25 (L) >60 mL/min   GFR calc Af Amer 29 (L)  >60 mL/min   Anion gap 12 5 - 15  CBC WITH DIFFERENTIAL  Result Value Ref Range   WBC 11.5 (H) 4.0 - 10.5 K/uL   RBC 4.43 4.22 - 5.81 MIL/uL   Hemoglobin 14.4 13.0 - 17.0 g/dL   HCT 42.0 39.0 - 52.0 %   MCV 94.8 78.0 - 100.0 fL   MCH 32.5 26.0 - 34.0 pg   MCHC 34.3 30.0 - 36.0 g/dL   RDW 14.9 11.5 - 15.5 %   Platelets 349 150 - 400 K/uL   Neutrophils Relative % 85 %   Lymphocytes Relative 8 %   Monocytes Relative 7 %   Eosinophils Relative 0 %   Basophils Relative 0 %   Neutro Abs 9.8 (H) 1.7 - 7.7 K/uL   Lymphs Abs 0.9 0.7 - 4.0 K/uL   Monocytes Absolute 0.8 0.1 - 1.0 K/uL   Eosinophils Absolute 0.0 0.0 - 0.7 K/uL   Basophils Absolute 0.0 0.0 - 0.1 K/uL   WBC Morphology MILD LEFT SHIFT (1-5% METAS, OCC MYELO, OCC BANDS)   Influenza panel by PCR (type A & B)  Result Value Ref Range   Influenza A By PCR NEGATIVE NEGATIVE   Influenza B By PCR NEGATIVE NEGATIVE  I-Stat CG4 Lactic Acid, ED  (not at  [facility])  Result Value Ref Range   Lactic Acid, Venous 3.83 (HH) 0.5 - 1.9 mmol/L   Comment NOTIFIED PHYSICIAN     EKG  EKG Interpretation None       Radiology Dg Chest Port 1 View  Result Date: 07/06/2017 CLINICAL DATA:  Fever, chills and nausea.  Shortness of breath. EXAM: PORTABLE CHEST 1 VIEW COMPARISON:  01/08/2016. FINDINGS: Poor inspiration. Borderline enlarged cardiac silhouette, magnified by the poor inspiration and portable AP technique. Clear lungs with normal vascularity. Thoracic spine degenerative changes. IMPRESSION: No acute abnormality. Electronically Signed   By: Claudie Revering M.D.   On: 07/06/2017 20:41    Procedures Procedures (including critical care time)  Medications Ordered in ED Medications  ondansetron (ZOFRAN) injection 4 mg (not administered)  sodium chloride 0.9 % bolus 1,000 mL (1,000 mLs Intravenous New Bag/Given 07/06/17 2029)     Initial Impression /  Assessment and Plan / ED Course  I have reviewed the triage vital signs and the nursing  notes.  Pertinent labs & imaging results that were available during my care of the patient were reviewed by me and considered in my medical decision making (see chart for details).  Iv ns bolus.   Labs and cultures. Continuous pulse ox and monitor. 02. Stat labs sent.   Cxr.   After cultures drawn, iv abx given.  Reviewed nursing notes and prior charts for additional history.   30 cc/kg ns iv.   Reviewed cxr - no acute infection.  Labs reviewed - lactate high, aki.  Source of infection unclear.  Recheck no increased wob or sob. No abd pain or tenderness on exam.   Cefepime and vanc iv.   Initial lactate high. Additional ns iv boluses.   Patient admitted to SDU with acute febrile illness, immunocompromised state, AKI, hypotension.  CRITICAL CARE  RE acute febrile illness in immunocompromised patient with hypotension, AKI, elevated lactate.  Performed by: Mirna Mires Total critical care time: 40 minutes Critical care time was exclusive of separately billable procedures and treating other patients. Critical care was necessary to treat or prevent imminent or life-threatening deterioration. Critical care was time spent personally by me on the following activities: development of treatment plan with patient and/or surrogate as well as nursing, discussions with consultants, evaluation of patient's response to treatment, examination of patient, obtaining history from patient or surrogate, ordering and performing treatments and interventions, ordering and review of laboratory studies, ordering and review of radiographic studies, pulse oximetry and re-evaluation of patient's condition.   hospitalists consulted for admission. Discussed with Dr Alcario Drought - he will admit.     Final Clinical Impressions(s) / ED Diagnoses   Final diagnoses:  None    ED Discharge Orders    None             Lajean Saver, MD 08/09/17 1218

## 2017-07-06 NOTE — ED Notes (Signed)
I Stat Lactic 3.83 RN and provider notified

## 2017-07-06 NOTE — ED Notes (Signed)
Bed: ZO10 Expected date: 07/06/17 Expected time: 7:00 PM Means of arrival: Ambulance Comments: Ca pt, fever and chills

## 2017-07-06 NOTE — ED Triage Notes (Signed)
Pt arrived from home [facility] with fever, chills, and N. Pt has hx of cancer and recieved got stem cells 15 months ago. Pt denies pain. Pt states he is tired and weak. Pt is displaying shortness of breath as he is speaking. Pt is on blood thinners, methotrexate, and antirejection drugs for the stem cells.

## 2017-07-06 NOTE — Progress Notes (Signed)
A consult was received from an ED physician for vancomycin and cefepime per pharmacy dosing.  The patient's profile has been reviewed for ht/wt/allergies/indication/available labs.    A one time order has been placed for cefepime 2gm IV x1 and vancomycin 1000 mg IV x1.  Further antibiotics/pharmacy consults should be ordered by admitting physician if indicated.                       Thank you, Lynelle Doctor 07/06/2017  8:58 PM

## 2017-07-07 ENCOUNTER — Other Ambulatory Visit: Payer: Self-pay

## 2017-07-07 DIAGNOSIS — Z23 Encounter for immunization: Secondary | ICD-10-CM | POA: Diagnosis not present

## 2017-07-07 DIAGNOSIS — R509 Fever, unspecified: Secondary | ICD-10-CM | POA: Diagnosis not present

## 2017-07-07 DIAGNOSIS — Z9481 Bone marrow transplant status: Secondary | ICD-10-CM | POA: Diagnosis not present

## 2017-07-07 DIAGNOSIS — R739 Hyperglycemia, unspecified: Secondary | ICD-10-CM

## 2017-07-07 DIAGNOSIS — R110 Nausea: Secondary | ICD-10-CM | POA: Diagnosis not present

## 2017-07-07 DIAGNOSIS — A419 Sepsis, unspecified organism: Principal | ICD-10-CM

## 2017-07-07 DIAGNOSIS — I959 Hypotension, unspecified: Secondary | ICD-10-CM | POA: Diagnosis not present

## 2017-07-07 DIAGNOSIS — Z79899 Other long term (current) drug therapy: Secondary | ICD-10-CM | POA: Diagnosis not present

## 2017-07-07 DIAGNOSIS — D89813 Graft-versus-host disease, unspecified: Secondary | ICD-10-CM | POA: Diagnosis not present

## 2017-07-07 DIAGNOSIS — T380X5A Adverse effect of glucocorticoids and synthetic analogues, initial encounter: Secondary | ICD-10-CM | POA: Diagnosis not present

## 2017-07-07 DIAGNOSIS — Z794 Long term (current) use of insulin: Secondary | ICD-10-CM | POA: Diagnosis not present

## 2017-07-07 DIAGNOSIS — E860 Dehydration: Secondary | ICD-10-CM | POA: Diagnosis not present

## 2017-07-07 DIAGNOSIS — Z9484 Stem cells transplant status: Secondary | ICD-10-CM | POA: Diagnosis not present

## 2017-07-07 DIAGNOSIS — E0965 Drug or chemical induced diabetes mellitus with hyperglycemia: Secondary | ICD-10-CM | POA: Diagnosis present

## 2017-07-07 DIAGNOSIS — D469 Myelodysplastic syndrome, unspecified: Secondary | ICD-10-CM | POA: Diagnosis not present

## 2017-07-07 DIAGNOSIS — N179 Acute kidney failure, unspecified: Secondary | ICD-10-CM | POA: Diagnosis not present

## 2017-07-07 DIAGNOSIS — R7989 Other specified abnormal findings of blood chemistry: Secondary | ICD-10-CM | POA: Diagnosis not present

## 2017-07-07 DIAGNOSIS — I10 Essential (primary) hypertension: Secondary | ICD-10-CM | POA: Diagnosis not present

## 2017-07-07 DIAGNOSIS — Z86718 Personal history of other venous thrombosis and embolism: Secondary | ICD-10-CM | POA: Diagnosis not present

## 2017-07-07 DIAGNOSIS — E273 Drug-induced adrenocortical insufficiency: Secondary | ICD-10-CM | POA: Diagnosis present

## 2017-07-07 LAB — CBC
HCT: 32.2 % — ABNORMAL LOW (ref 39.0–52.0)
HEMOGLOBIN: 10.6 g/dL — AB (ref 13.0–17.0)
MCH: 31.5 pg (ref 26.0–34.0)
MCHC: 32.9 g/dL (ref 30.0–36.0)
MCV: 95.8 fL (ref 78.0–100.0)
PLATELETS: 246 10*3/uL (ref 150–400)
RBC: 3.36 MIL/uL — AB (ref 4.22–5.81)
RDW: 15.4 % (ref 11.5–15.5)
WBC: 8.3 10*3/uL (ref 4.0–10.5)

## 2017-07-07 LAB — BASIC METABOLIC PANEL
Anion gap: 9 (ref 5–15)
BUN: 26 mg/dL — AB (ref 6–20)
CHLORIDE: 111 mmol/L (ref 101–111)
CO2: 14 mmol/L — AB (ref 22–32)
Calcium: 7.4 mg/dL — ABNORMAL LOW (ref 8.9–10.3)
Creatinine, Ser: 1.87 mg/dL — ABNORMAL HIGH (ref 0.61–1.24)
GFR calc Af Amer: 41 mL/min — ABNORMAL LOW (ref >60)
GFR calc non Af Amer: 36 mL/min — ABNORMAL LOW (ref >60)
GLUCOSE: 156 mg/dL — AB (ref 65–99)
Potassium: 4.7 mmol/L (ref 3.5–5.1)
SODIUM: 134 mmol/L — AB (ref 135–145)

## 2017-07-07 LAB — GLUCOSE, CAPILLARY
GLUCOSE-CAPILLARY: 154 mg/dL — AB (ref 65–99)
GLUCOSE-CAPILLARY: 152 mg/dL — AB (ref 65–99)
Glucose-Capillary: 124 mg/dL — ABNORMAL HIGH (ref 65–99)
Glucose-Capillary: 111 mg/dL — ABNORMAL HIGH (ref 65–99)
Glucose-Capillary: 132 mg/dL — ABNORMAL HIGH (ref 65–99)

## 2017-07-07 LAB — CORTISOL: CORTISOL PLASMA: 21.4 ug/dL

## 2017-07-07 LAB — C DIFFICILE QUICK SCREEN W PCR REFLEX
C DIFFICLE (CDIFF) ANTIGEN: NEGATIVE
C Diff interpretation: NOT DETECTED
C Diff toxin: NEGATIVE

## 2017-07-07 LAB — PROCALCITONIN: Procalcitonin: 40.98 ng/mL

## 2017-07-07 LAB — HEPARIN LEVEL (UNFRACTIONATED)
Heparin Unfractionated: 0.5 IU/mL (ref 0.30–0.70)
Heparin Unfractionated: 0.56 IU/mL (ref 0.30–0.70)

## 2017-07-07 MED ORDER — POLYVINYL ALCOHOL 1.4 % OP SOLN
1.0000 [drp] | OPHTHALMIC | Status: DC | PRN
  Filled 2017-07-07: qty 15

## 2017-07-07 MED ORDER — INSULIN ASPART 100 UNIT/ML ~~LOC~~ SOLN
0.0000 [IU] | Freq: Three times a day (TID) | SUBCUTANEOUS | Status: DC
  Administered 2017-07-07: 2 [IU] via SUBCUTANEOUS
  Administered 2017-07-07 – 2017-07-08 (×2): 3 [IU] via SUBCUTANEOUS

## 2017-07-07 MED ORDER — DEXTROSE 5 % IV SOLN
2.0000 g | INTRAVENOUS | Status: DC
  Administered 2017-07-07: 2 g via INTRAVENOUS
  Filled 2017-07-07: qty 2

## 2017-07-07 MED ORDER — VANCOMYCIN HCL IN DEXTROSE 1-5 GM/200ML-% IV SOLN
1000.0000 mg | Freq: Once | INTRAVENOUS | Status: AC
  Administered 2017-07-07: 1000 mg via INTRAVENOUS
  Filled 2017-07-07: qty 200

## 2017-07-07 MED ORDER — HEPARIN (PORCINE) IN NACL 100-0.45 UNIT/ML-% IJ SOLN
1600.0000 [IU]/h | INTRAMUSCULAR | Status: AC
  Administered 2017-07-07 (×2): 1600 [IU]/h via INTRAVENOUS
  Filled 2017-07-07 (×3): qty 250

## 2017-07-07 MED ORDER — HYDROCORTISONE 1 % EX CREA
1.0000 "application " | TOPICAL_CREAM | Freq: Three times a day (TID) | CUTANEOUS | Status: DC | PRN
  Administered 2017-07-07: 1 via TOPICAL
  Filled 2017-07-07: qty 28

## 2017-07-07 MED ORDER — INSULIN DETEMIR 100 UNIT/ML ~~LOC~~ SOLN
10.0000 [IU] | Freq: Every day | SUBCUTANEOUS | Status: DC
  Administered 2017-07-07 – 2017-07-08 (×2): 10 [IU] via SUBCUTANEOUS
  Filled 2017-07-07 (×3): qty 0.1

## 2017-07-07 MED ORDER — ONDANSETRON HCL 4 MG PO TABS
4.0000 mg | ORAL_TABLET | Freq: Four times a day (QID) | ORAL | Status: DC | PRN
  Administered 2017-07-08: 4 mg via ORAL
  Filled 2017-07-07: qty 1

## 2017-07-07 MED ORDER — ACETAMINOPHEN 650 MG RE SUPP: 650.0000 mg | Freq: Four times a day (QID) | RECTAL | Status: DC | PRN

## 2017-07-07 MED ORDER — SODIUM CHLORIDE 0.9 % IV SOLN
INTRAVENOUS | Status: DC
  Administered 2017-07-07 (×2): via INTRAVENOUS

## 2017-07-07 MED ORDER — HYDROCORTISONE NA SUCCINATE PF 100 MG IJ SOLR
50.0000 mg | Freq: Three times a day (TID) | INTRAMUSCULAR | Status: DC
  Administered 2017-07-07 – 2017-07-09 (×7): 50 mg via INTRAVENOUS
  Filled 2017-07-07 (×7): qty 2

## 2017-07-07 MED ORDER — ACYCLOVIR 400 MG PO TABS
400.0000 mg | ORAL_TABLET | Freq: Two times a day (BID) | ORAL | Status: DC
  Administered 2017-07-07 – 2017-07-09 (×5): 400 mg via ORAL
  Filled 2017-07-07 (×5): qty 1

## 2017-07-07 MED ORDER — ACETAMINOPHEN 325 MG PO TABS: 650.0000 mg | ORAL_TABLET | Freq: Four times a day (QID) | ORAL | Status: DC | PRN

## 2017-07-07 MED ORDER — INFLUENZA VAC SPLIT HIGH-DOSE 0.5 ML IM SUSY
0.5000 mL | PREFILLED_SYRINGE | INTRAMUSCULAR | Status: AC
  Administered 2017-07-09: 0.5 mL via INTRAMUSCULAR
  Filled 2017-07-07: qty 0.5

## 2017-07-07 MED ORDER — POSACONAZOLE 100 MG PO TBEC
300.0000 mg | DELAYED_RELEASE_TABLET | Freq: Every day | ORAL | Status: DC
  Administered 2017-07-07 – 2017-07-09 (×3): 300 mg via ORAL
  Filled 2017-07-07 (×3): qty 3

## 2017-07-07 MED ORDER — ONDANSETRON HCL 4 MG/2ML IJ SOLN
4.0000 mg | Freq: Four times a day (QID) | INTRAMUSCULAR | Status: DC | PRN
  Administered 2017-07-08: 4 mg via INTRAVENOUS
  Filled 2017-07-07: qty 2

## 2017-07-07 MED ORDER — VANCOMYCIN HCL 10 G IV SOLR
1250.0000 mg | INTRAVENOUS | Status: DC
  Administered 2017-07-08: 1250 mg via INTRAVENOUS
  Filled 2017-07-07: qty 1250

## 2017-07-07 NOTE — Progress Notes (Signed)
ANTICOAGULATION CONSULT NOTE - Initial Consult  Pharmacy Consult for IV heparin Indication: hx of DVT, on enoxaparin PTA  No Known Allergies  Patient Measurements: Height: 5\' 11"  (180.3 cm) Weight: 204 lb 12.9 oz (92.9 kg) IBW/kg (Calculated) : 75.3 Heparin Dosing Weight: actual body weight  Vital Signs: Temp: 98.9 F (37.2 C) (01/13 0202) Temp Source: Oral (01/13 0202) BP: 102/63 (01/13 0202) Pulse Rate: 75 (01/13 0202)  Labs: Recent Labs    07/06/17 2042  HGB 14.4  HCT 42.0  PLT 349  CREATININE 2.48*    Estimated Creatinine Clearance: 33.6 mL/min (A) (by C-G formula based on SCr of 2.48 mg/dL (H)).   Medical History: Past Medical History:  Diagnosis Date  . Allergy    seasonal/animals  . Chronic prostatitis 12/17/2008  . Diverticulosis 01/16/2011   History of diverticulitis   . ERECTILE DYSFUNCTION 02/07/2007  . HYPERLIPIDEMIA 02/04/2007  . HYPERTENSION 02/04/2007  . NEOPLASM, MALIGNANT, SKIN, BACK 02/09/2009  . S/P bone marrow transplant ([place])     Assessment: 69 y/oM with PMH of bone marrow transplant 14 months ago, GvHD, MDS/AML, DVT on enoxaparin PTA who presents with n/v/d, chills, weakness, hypotension, and AKI. Pharmacy consulted to start heparin infusion while enoxaparin on hold due to AKI. Last dose of Enoxaparin 80mg  SQ q12h reported as 07/06/2017 at 0830. H/H, Pltc WNL. SCr ~ 2 times baseline.   Goal of Therapy:  Heparin level 0.3-0.7 units/ml Monitor platelets by anticoagulation protocol: Yes   Plan:   Start heparin infusion at 1600 units/hr (no initial bolus due to recent enoxaparin administration).   Check heparin level 8 hours after initiation of heparin.   Daily CBC and heparin level while on heparin infusion.   Monitor closely for s/sx of bleeding.    Lindell Spar, PharmD, BCPS Pager: [phone] 07/07/2017 2:52 AM

## 2017-07-07 NOTE — Progress Notes (Signed)
Pharmacy Antibiotic Note  KAULIN CHAVES is a 67 y.o. male admitted on 07/06/2017 with sepsis.  Pharmacy has been consulted for Vancomycin and Cefepime dosing.  Plan: Vancomycin 1g IV x 1 given in the ED. Give additional 1g IV x 1 to complete loading dose. Will follow-up SCr in AM to determine further Vancomycin dosing. Cefepime 2g IV q24h. Monitor renal function, cultures, clinical course.   Height: 5\' 11"  (180.3 cm) Weight: 204 lb 12.9 oz (92.9 kg) IBW/kg (Calculated) : 75.3  Temp (24hrs), Avg:99.2 F (37.3 C), Min:98.9 F (37.2 C), Max:99.4 F (37.4 C)  Recent Labs  Lab 07/06/17 2042 07/06/17 2052 07/06/17 2249  WBC 11.5*  --   --   CREATININE 2.48*  --   --   LATICACIDVEN  --  3.83* 1.95*    Estimated Creatinine Clearance: 33.6 mL/min (A) (by C-G formula based on SCr of 2.48 mg/dL (H)).    No Known Allergies  Antimicrobials this admission: 1/12 >> Vancomycin >> 1/12 >> Cefepime >>  Dose adjustments this admission: --  Microbiology results: 1/12 BCx: sent 1/12 UCx: sent    Thank you for allowing pharmacy to be a part of this patient's care.   Lindell Spar, PharmD, BCPS Pager: [phone] 07/07/2017 3:14 AM

## 2017-07-07 NOTE — Progress Notes (Signed)
ANTICOAGULATION CONSULT NOTE -   Pharmacy Consult for IV heparin Indication: hx of DVT, on enoxaparin PTA  No Known Allergies  Patient Measurements: Height: 5\' 11"  (180.3 cm) Weight: 204 lb 12.9 oz (92.9 kg) IBW/kg (Calculated) : 75.3 Heparin Dosing Weight: actual body weight  Vital Signs: Temp: 98.8 F (37.1 C) (01/13 0428) Temp Source: Oral (01/13 0428) BP: 98/60 (01/13 0428) Pulse Rate: 70 (01/13 0428)  Labs: Recent Labs    07/06/17 2042 07/07/17 0413 07/07/17 1222  HGB 14.4 10.6*  --   HCT 42.0 32.2*  --   PLT 349 246  --   HEPARINUNFRC  --   --  0.50  CREATININE 2.48* 1.87*  --     Estimated Creatinine Clearance: 44.6 mL/min (A) (by C-G formula based on SCr of 1.87 mg/dL (H)).   Medical History: Past Medical History:  Diagnosis Date  . Allergy    seasonal/animals  . Chronic prostatitis 12/17/2008  . Diverticulosis 01/16/2011   History of diverticulitis   . ERECTILE DYSFUNCTION 02/07/2007  . HYPERLIPIDEMIA 02/04/2007  . HYPERTENSION 02/04/2007  . NEOPLASM, MALIGNANT, SKIN, BACK 02/09/2009  . S/P bone marrow transplant ([place])     Assessment: 78 y/oM with PMH of bone marrow transplant 14 months ago, GvHD, MDS/AML, DVT on enoxaparin PTA who presents with n/v/d, chills, weakness, hypotension, and AKI. Pharmacy consulted to start heparin infusion while enoxaparin on hold due to AKI. Last dose of Enoxaparin 80mg  SQ q12h reported as 07/06/2017 at 0830.  07/07/2017 1st Heparin level 0.5, therapeutic Scr 1.87, improved H/H low, Plts WNL No bleeding or other issues per RN   Goal of Therapy:  Heparin level 0.3-0.7 units/ml Monitor platelets by anticoagulation protocol: Yes   Plan:   continue heparin infusion at 1600 units/hr (no initial bolus due to recent enoxaparin administration).   Check heparin level 8 hours   Daily CBC and heparin level while on heparin infusion.   Monitor closely for s/sx of bleeding.    Dolly Rias RPh 07/07/2017, 12:55 PM Pager  [phone]

## 2017-07-07 NOTE — Progress Notes (Addendum)
PROGRESS NOTE  Brandon [name] ATF:[id number] DOB: Apr 01, 1951 DOA: 07/06/2017 PCP: [name], MD   LOS: 0 days   Brief Narrative / Interim history: 67 year old male with history of MDS status post BMT about 1/2 years ago, followed at [facility], post transplant course complicated by graft-versus-host disease, who presented to the emergency room on 1/12 with sudden onset abdominal discomfort, nausea vomiting and watery diarrhea as well as chills and generalized weakness.  Patient denies any upper respiratory type symptoms, denies any sinus congestion, runny nose, sore throat.  Denies any sick contacts.  He has been eating out couple of times in the past 2 days eating hamburgers, as well as had some shrimp the night prior.  In the ED he was found to be septic with hypotension, tachycardia, elevated lactic acid and was placed on broad-spectrum antibiotics and admitted to the hospital  Assessment & Plan: Principal Problem:   Sepsis associated hypotension (Echo) Active Problems:   Essential hypertension   Steroid-induced hyperglycemia   History of stem cell transplant ([place])   GVHD (graft versus host disease) (HCC)  SIRS -With hypotension, status post IV fluids in the ED, blood pressure stayed in the 27C systolic this morning, continue IV fluids -Patient placed on vancomycin and cefepime, blood cultures, urine cultures were sent, continue to closely monitor -Pro-calcitonin was significantly elevated at 40 -He had watery diarrhea at home, obtain C. difficile, obtain gastrointestinal panel  Acute kidney injury -Likely in the setting of GI losses with nausea vomiting -Continue IV fluids, creatinine 2.5 on admission has improved to 1.8 this morning  History of BMT for MDS complicated by GVHD -Obtain tacrolimus level, hold tacrolimus -Hold home steroids, place on stress dose steroids for relative adrenal insufficiency due to chronic steroid use -Continue home acyclovir, Noxavil -Discussed with  his [facility] BMT MD Dr.Sarantopolous over the phone  Steroid-induced diabetes mellitus -Continue Levemir, sliding scale  Hypertension -Hold home antihypertensives due to hypotension  History of DVTs -On Lovenox at home, continue heparin infusion while here   DVT prophylaxis: On heparin infusion Code Status: Full code Family Communication: Discussed with wife at bedside Disposition Plan: Home when ready  Consultants:   None  Procedures:   None   Antimicrobials:  Vancomycin 1/13 >>  Cefepime 1/13 >>   Subjective: -Feels improved this morning, feels like he has more energy, able to walk to the bathroom and back.  No diarrhea overnight.  No nausea or vomiting.  Wants to eat more.  Objective: Vitals:   07/07/17 0100 07/07/17 0130 07/07/17 0202 07/07/17 0428  BP: (!) 107/50 104/61 102/63 98/60  Pulse: 78 76 75 70  Resp: (!) 23 (!) 26 18 12   Temp:   98.9 F (37.2 C) 98.8 F (37.1 C)  TempSrc:   Oral Oral  SpO2: 92% 97% 98% 97%  Weight:   92.9 kg (204 lb 12.9 oz)   Height:   5\' 11"  (1.803 m)     Intake/Output Summary (Last 24 hours) at 07/07/2017 0951 Last data filed at 07/07/2017 0436 Gross per 24 hour  Intake 4997.99 ml  Output -  Net 4997.99 ml   Filed Weights   07/06/17 1928 07/07/17 0202  Weight: 93 kg (205 lb) 92.9 kg (204 lb 12.9 oz)    Examination:  Constitutional: NAD Eyes: PERRL, lids and conjunctivae normal ENMT: Mucous membranes are moist. No oropharyngeal exudates Neck: normal, supple Respiratory: clear to auscultation bilaterally, no wheezing, no crackles. Normal respiratory effort. No accessory muscle use.  Cardiovascular: Regular  rate and rhythm, no murmurs / rubs / gallops. 1+ LE edema. 2+ pedal pulses.  Abdomen: no tenderness. Bowel sounds positive.  Musculoskeletal: Normal muscle tone.  Skin: no rashes Neurologic: CN 2-12 grossly intact. Strength 5/5 in all 4.  Psychiatric: Normal judgment and insight. Alert and oriented x 3. Normal mood.      Data Reviewed: I have independently reviewed following labs and imaging studies   Chest x-ray 1/12 -no infiltrates seen  CBC: Recent Labs  Lab 07/06/17 2042 07/07/17 0413  WBC 11.5* 8.3  NEUTROABS 9.8*  --   HGB 14.4 10.6*  HCT 42.0 32.2*  MCV 94.8 95.8  PLT 349 034   Basic Metabolic Panel: Recent Labs  Lab 07/06/17 2042 07/07/17 0413  NA 136 134*  K 4.3 4.7  CL 105 111  CO2 19* 14*  GLUCOSE 153* 156*  BUN 28* 26*  CREATININE 2.48* 1.87*  CALCIUM 8.9 7.4*   GFR: Estimated Creatinine Clearance: 44.6 mL/min (A) (by C-G formula based on SCr of 1.87 mg/dL (H)). Liver Function Tests: Recent Labs  Lab 07/06/17 2042  AST 32  ALT 41  ALKPHOS 176*  BILITOT 0.7  PROT 6.7  ALBUMIN 3.7   No results for input(s): LIPASE, AMYLASE in the last 168 hours. No results for input(s): AMMONIA in the last 168 hours. Coagulation Profile: No results for input(s): INR, PROTIME in the last 168 hours. Cardiac Enzymes: No results for input(s): CKTOTAL, CKMB, CKMBINDEX, TROPONINI in the last 168 hours. BNP (last 3 results) No results for input(s): PROBNP in the last 8760 hours. HbA1C: No results for input(s): HGBA1C in the last 72 hours. CBG: Recent Labs  Lab 07/07/17 0210 07/07/17 0745  GLUCAP 154* 152*   Lipid Profile: No results for input(s): CHOL, HDL, LDLCALC, TRIG, CHOLHDL, LDLDIRECT in the last 72 hours. Thyroid Function Tests: No results for input(s): TSH, T4TOTAL, FREET4, T3FREE, THYROIDAB in the last 72 hours. Anemia Panel: No results for input(s): VITAMINB12, FOLATE, FERRITIN, TIBC, IRON, RETICCTPCT in the last 72 hours. Urine analysis:    Component Value Date/Time   COLORURINE AMBER (A) 07/06/2017 2305   APPEARANCEUR CLOUDY (A) 07/06/2017 2305   LABSPEC 1.021 07/06/2017 2305   PHURINE 5.0 07/06/2017 2305   GLUCOSEU NEGATIVE 07/06/2017 2305   GLUCOSEU NEGATIVE 12/23/2009 0742   HGBUR NEGATIVE 07/06/2017 2305   HGBUR negative 01/24/2007 0810    BILIRUBINUR SMALL (A) 07/06/2017 2305   BILIRUBINUR negative 08/10/2014 1057   BILIRUBINUR n 02/11/2013 1059   KETONESUR 5 (A) 07/06/2017 2305   PROTEINUR 100 (A) 07/06/2017 2305   UROBILINOGEN 0.2 08/10/2014 1057   UROBILINOGEN 0.2 12/23/2009 0742   NITRITE NEGATIVE 07/06/2017 2305   LEUKOCYTESUR NEGATIVE 07/06/2017 2305   Sepsis Labs: Invalid input(s): PROCALCITONIN, LACTICIDVEN  No results found for this or any previous visit (from the past 240 hour(s)).    Radiology Studies: Dg Chest Port 1 View  Result Date: 07/06/2017 CLINICAL DATA:  Fever, chills and nausea.  Shortness of breath. EXAM: PORTABLE CHEST 1 VIEW COMPARISON:  01/08/2016. FINDINGS: Poor inspiration. Borderline enlarged cardiac silhouette, magnified by the poor inspiration and portable AP technique. Clear lungs with normal vascularity. Thoracic spine degenerative changes. IMPRESSION: No acute abnormality. Electronically Signed   By: Claudie Revering M.D.   On: 07/06/2017 20:41     Scheduled Meds: . acyclovir  400 mg Oral BID  . hydrocortisone sod succinate (SOLU-CORTEF) inj  50 mg Intravenous Q8H  . [START ON 07/09/2017] Influenza vac split quadrivalent PF  0.5 mL  Intramuscular Tomorrow-1000  . insulin aspart  0-15 Units Subcutaneous TID WC  . insulin detemir  10 Units Subcutaneous QHS  . posaconazole  300 mg Oral Q breakfast   Continuous Infusions: . sodium chloride 125 mL/hr at 07/07/17 0217  . ceFEPime (MAXIPIME) IV    . heparin 1,600 Units/hr (07/07/17 0327)  . [START ON 07/08/2017] vancomycin         Time spent: 50 minutes, > 50% bedside in direct discussion with patient and patient's wife, also extended chart review from [place] [facility] clinic regarding the clinical progress over the last 14 months and graft-versus-host disease treatment and evaluation, and discussing over the phone with primary BMT [facility] MD Dr. [name], total time 9 am - 9:50 am.    Marzetta Board, MD, PhD [facility] Pager [phone]  [phone]  If 7PM-7AM, please contact night-coverage [email or web address] Password [facility] 07/07/2017, 9:51 AM

## 2017-07-07 NOTE — ED Notes (Signed)
ED TO INPATIENT HANDOFF REPORT  Name/Age/Gender Brandon [name] 67 y.o. male  Code Status    Code Status Orders  (From admission, onward)        Start     Ordered   07/07/17 0028  Full code  Continuous     07/07/17 0028    Code Status History    Date Active Date Inactive Code Status Order ID Comments User Context   01/09/2016 04:55 01/10/2016 00:25 Full Code [id number]  Rise Patience, MD Inpatient   08/13/2014 01:55 08/13/2014 16:32 Full Code [id number]  Allyne Gee, MD Inpatient    Advance Directive Documentation     [name] Recent Value  Type of Advance Directive  Healthcare Power of Attorney, Living will  Pre-existing out of facility DNR order (yellow form or pink [name] form)  No data  "[name]" Form in Place?  No data      Home/SNF/Other Home  Chief Complaint Fever; Chills; Nausea; Cancer Patient  Level of Care/Admitting Diagnosis ED Disposition    ED Disposition Condition Comment   Admit  Hospital Area: [place] [[id number]]  Level of Care: Med-Surg [16]  Diagnosis: Sepsis associated hypotension [facility]) [[id number]]  Admitting Physician: Doreatha Massed  Attending Physician: Etta Quill [phone]  Estimated length of stay: past midnight tomorrow  Certification:: I certify this patient will need inpatient services for at least 2 midnights  PT Class (Do Not Modify): Inpatient [101]  PT Acc Code (Do Not Modify): Private [1]       Medical History Past Medical History:  Diagnosis Date  . Allergy    seasonal/animals  . Chronic prostatitis 12/17/2008  . Diverticulosis 01/16/2011   History of diverticulitis   . ERECTILE DYSFUNCTION 02/07/2007  . HYPERLIPIDEMIA 02/04/2007  . HYPERTENSION 02/04/2007  . NEOPLASM, MALIGNANT, SKIN, BACK 02/09/2009  . S/P bone marrow transplant ([place])     Allergies No Known Allergies  IV Location/Drains/Wounds Patient Lines/Drains/Airways Status   Active Line/Drains/Airways    Name:   Placement date:    Placement time:   Site:   Days:   Peripheral IV 01/09/16 Right Antecubital   01/09/16    0215    Antecubital   545   Peripheral IV 07/06/17 Left Antecubital   07/06/17    1935    Antecubital   1   Peripheral IV 07/06/17 Right Antecubital   07/06/17    2233    Antecubital   1   PICC Double Lumen 12/22/15 PICC Left   12/22/15    -     563          Labs/Imaging Results for orders placed or performed during the hospital encounter of 07/06/17 (from the past 48 hour(s))  Comprehensive metabolic panel     Status: Abnormal   Collection Time: 07/06/17  8:42 PM  Result Value Ref Range   Sodium 136 135 - 145 mmol/L   Potassium 4.3 3.5 - 5.1 mmol/L   Chloride 105 101 - 111 mmol/L   CO2 19 (L) 22 - 32 mmol/L   Glucose, Bld 153 (H) 65 - 99 mg/dL   BUN 28 (H) 6 - 20 mg/dL   Creatinine, Ser 2.48 (H) 0.61 - 1.24 mg/dL   Calcium 8.9 8.9 - 10.3 mg/dL   Total Protein 6.7 6.5 - 8.1 g/dL   Albumin 3.7 3.5 - 5.0 g/dL   AST 32 15 - 41 U/L   ALT 41 17 - 63 U/L   Alkaline Phosphatase  176 (H) 38 - 126 U/L   Total Bilirubin 0.7 0.3 - 1.2 mg/dL   GFR calc non Af Amer 25 (L) >60 mL/min   GFR calc Af Amer 29 (L) >60 mL/min    Comment: (NOTE) The eGFR has been calculated using the CKD EPI equation. This calculation has not been validated in all clinical situations. eGFR's persistently <60 mL/min signify possible Chronic Kidney Disease.    Anion gap 12 5 - 15  CBC WITH DIFFERENTIAL     Status: Abnormal   Collection Time: 07/06/17  8:42 PM  Result Value Ref Range   WBC 11.5 (H) 4.0 - 10.5 K/uL   RBC 4.43 4.22 - 5.81 MIL/uL   Hemoglobin 14.4 13.0 - 17.0 g/dL   HCT 42.0 39.0 - 52.0 %   MCV 94.8 78.0 - 100.0 fL   MCH 32.5 26.0 - 34.0 pg   MCHC 34.3 30.0 - 36.0 g/dL   RDW 14.9 11.5 - 15.5 %   Platelets 349 150 - 400 K/uL   Neutrophils Relative % 85 %   Lymphocytes Relative 8 %   Monocytes Relative 7 %   Eosinophils Relative 0 %   Basophils Relative 0 %   Neutro Abs 9.8 (H) 1.7 - 7.7 K/uL   Lymphs  Abs 0.9 0.7 - 4.0 K/uL   Monocytes Absolute 0.8 0.1 - 1.0 K/uL   Eosinophils Absolute 0.0 0.0 - 0.7 K/uL   Basophils Absolute 0.0 0.0 - 0.1 K/uL   WBC Morphology MILD LEFT SHIFT (1-5% METAS, OCC MYELO, OCC BANDS)   I-Stat CG4 Lactic Acid, ED  (not at  [facility])     Status: Abnormal   Collection Time: 07/06/17  8:52 PM  Result Value Ref Range   Lactic Acid, Venous 3.83 (HH) 0.5 - 1.9 mmol/L   Comment NOTIFIED PHYSICIAN   Influenza panel by PCR (type A & B)     Status: None   Collection Time: 07/06/17  8:54 PM  Result Value Ref Range   Influenza A By PCR NEGATIVE NEGATIVE   Influenza B By PCR NEGATIVE NEGATIVE    Comment: (NOTE) The Xpert Xpress Flu assay is intended as an aid in the diagnosis of  influenza and should not be used as a sole basis for treatment.  This  assay is FDA approved for nasopharyngeal swab specimens only. Nasal  washings and aspirates are unacceptable for Xpert Xpress Flu testing.   I-Stat CG4 Lactic Acid, ED  (not at  [facility])     Status: Abnormal   Collection Time: 07/06/17 10:49 PM  Result Value Ref Range   Lactic Acid, Venous 1.95 (H) 0.5 - 1.9 mmol/L  Urinalysis, Routine w reflex microscopic     Status: Abnormal   Collection Time: 07/06/17 11:05 PM  Result Value Ref Range   Color, Urine AMBER (A) YELLOW    Comment: BIOCHEMICALS MAY BE AFFECTED BY COLOR   APPearance CLOUDY (A) CLEAR   Specific Gravity, Urine 1.021 1.005 - 1.030   pH 5.0 5.0 - 8.0   Glucose, UA NEGATIVE NEGATIVE mg/dL   Hgb urine dipstick NEGATIVE NEGATIVE   Bilirubin Urine SMALL (A) NEGATIVE   Ketones, ur 5 (A) NEGATIVE mg/dL   Protein, ur 100 (A) NEGATIVE mg/dL   Nitrite NEGATIVE NEGATIVE   Leukocytes, UA NEGATIVE NEGATIVE   RBC / HPF 0-5 0 - 5 RBC/hpf   WBC, UA 0-5 0 - 5 WBC/hpf   Bacteria, UA RARE (A) NONE SEEN   Squamous Epithelial / LPF 0-5 (  A) NONE SEEN   Mucus PRESENT    Hyaline Casts, UA PRESENT    Granular Casts, UA PRESENT    Amorphous Crystal PRESENT    Sperm, UA PRESENT     Dg Chest Port 1 View  Result Date: 07/06/2017 CLINICAL DATA:  Fever, chills and nausea.  Shortness of breath. EXAM: PORTABLE CHEST 1 VIEW COMPARISON:  01/08/2016. FINDINGS: Poor inspiration. Borderline enlarged cardiac silhouette, magnified by the poor inspiration and portable AP technique. Clear lungs with normal vascularity. Thoracic spine degenerative changes. IMPRESSION: No acute abnormality. Electronically Signed   By: Claudie Revering M.D.   On: 07/06/2017 20:41    Pending Labs Unresulted Labs (From admission, onward)   Start     Ordered   07/08/17 0500  Procalcitonin  Daily,   R     07/07/17 0028   07/07/17 0500  CBC  Tomorrow morning,   R     07/07/17 0028   07/07/17 1740  Basic metabolic panel  Tomorrow morning,   R     07/07/17 0028   07/07/17 0028  Procalcitonin - Baseline  STAT,   STAT     07/07/17 0028   07/07/17 0001  Cortisol  STAT,   R     07/07/17 0000   07/06/17 2022  Urine culture  STAT,   STAT     07/06/17 2022   07/06/17 2021  Blood Culture (routine x 2)  BLOOD CULTURE X 2,   STAT     07/06/17 2022      Vitals/Pain Today's Vitals   07/07/17 0000 07/07/17 0026 07/07/17 0030 07/07/17 0100  BP: 114/67 102/61 102/61 (!) 107/50  Pulse: 84 81 82 78  Resp: (!) 25 20 (!) 29 (!) 23  Temp:      TempSrc:      SpO2: 100% 97% 99% 92%  Weight:      Height:        Isolation Precautions Droplet precaution  Medications Medications  tacrolimus (PROGRAF) capsule 0.5 mg (not administered)  hydrocortisone sodium succinate (SOLU-CORTEF) 100 MG injection 100 mg (not administered)  hydrocortisone sodium succinate (SOLU-CORTEF) 100 MG injection 50 mg (0 mg Intravenous Hold 07/07/17 0000)  acyclovir (ZOVIRAX) tablet 400 mg (not administered)  posaconazole (NOXAFIL) delayed-release tablet 300 mg (not administered)  insulin detemir (LEVEMIR) injection 10 Units (not administered)  insulin aspart (novoLOG) injection 0-15 Units (not administered)  acetaminophen (TYLENOL) tablet  650 mg (not administered)    Or  acetaminophen (TYLENOL) suppository 650 mg (not administered)  ondansetron (ZOFRAN) tablet 4 mg (not administered)    Or  ondansetron (ZOFRAN) injection 4 mg (not administered)  0.9 %  sodium chloride infusion (not administered)  sodium chloride 0.9 % bolus 1,000 mL (0 mLs Intravenous Stopped 07/06/17 2128)  ondansetron (ZOFRAN) injection 4 mg (4 mg Intravenous Given 07/06/17 2051)  ceFEPIme (MAXIPIME) 2 g in dextrose 5 % 50 mL IVPB (0 g Intravenous Stopped 07/06/17 2304)  vancomycin (VANCOCIN) IVPB 1000 mg/200 mL premix (0 mg Intravenous Stopped 07/06/17 2223)  sodium chloride 0.9 % bolus 1,000 mL (0 mLs Intravenous Stopped 07/06/17 2153)    And  sodium chloride 0.9 % bolus 1,000 mL (0 mLs Intravenous Stopped 07/06/17 2237)    And  sodium chloride 0.9 % bolus 1,000 mL (0 mLs Intravenous Stopped 07/06/17 2315)    Mobility walks

## 2017-07-07 NOTE — Progress Notes (Signed)
Pharmacy: Re- heparin  Patient's a 67 y.o. M with hx bone marrow transplant and DVT on lovenox PTA, presented to the ED on 07/06/17 with c/o fever and chills.He was found to have AKI on admission.  Patient was transitioned from lovenox to heparin on admission.  - confirmatory heparin level now back therapeutic at 0.56 (goal 0.3-0.7)  Plan: - continue heparin drip at 1600 units/hr - daily heparin level - monitor for s/s bleeding  Dia Sitter, PharmD, BCPS 07/07/2017 8:14 PM

## 2017-07-07 NOTE — H&P (Signed)
History and Physical    Brandon [name] GNF:[id number] DOB: [birth year] DOA: 07/06/2017  PCP: [name], MD  Patient coming from: Home  I have personally briefly reviewed patient's old medical records in [place]  Chief Complaint: N/V/D, chills, weakness  HPI: Brandon [name] is a 67 y.o. 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  Presents to the ED with c/o fevers onset today.  Tm 101.  Chills.  Generalized weakness.  N/V/D.  No CP, no abd pain, no dysuria, no rash, no neck stiffness, no headache, no cough, no other localizing symptoms.   ED Course: BP initially 90 systolic, and tachy to low 100s.  Improves to 110 now after 2L NS bolus.  Given empiric cefepime and vanc in ED for sepsis of unknown source.   Review of Systems: As per HPI otherwise 10 point review of systems negative.   Past Medical History:  Diagnosis Date  . Allergy    seasonal/animals  . Chronic prostatitis 12/17/2008  . Diverticulosis 01/16/2011   History of diverticulitis   . ERECTILE DYSFUNCTION 02/07/2007  . HYPERLIPIDEMIA 02/04/2007  . HYPERTENSION 02/04/2007  . NEOPLASM, MALIGNANT, SKIN, BACK 02/09/2009  . S/P bone marrow transplant [facility])     Past Surgical History:  Procedure Laterality Date  . none       reports that  has never smoked. he has never used smokeless tobacco. He reports that he drinks about 1.8 oz of alcohol per week. He reports that he does not use drugs.  No Known Allergies  Family History  Problem Relation Age of Onset  . Breast cancer Mother   . CVA Mother        quit taking HTN meds  . Alzheimer's disease Father        late 67s. mid 69s  . Asperger's syndrome Son      Prior to Admission medications   Medication Sig Start Date End Date Taking? Authorizing Provider  acyclovir (ZOVIRAX) 400 MG tablet Take 400 mg by mouth 2 (two) times daily.  07/13/16 07/13/17  [provider]  atenolol (TENORMIN)  50 MG tablet Take 1 tablet (50 mg total) by mouth daily. Patient taking differently: Take 25 mg by mouth every evening.  12/09/15   [name], MD  cholecalciferol (VITAMIN D) 1000 units tablet Take 1,000 Units by mouth daily.    [provider]  enoxaparin (LOVENOX) 80 MG/0.8ML injection Inject 80 mg into the skin.    [provider]  fluticasone Asencion Islam) 50 MCG/ACT nasal spray  05/10/16   [provider]  folic acid (FOLVITE) 846 MCG tablet Take 400 mcg by mouth 2 (two) times daily.    [provider]  HYDROCORTISONE PO Take 10 mg by mouth. Take 2 tablets in AM and 1 tablet in PM    [provider]  insulin detemir (LEVEMIR) 100 UNIT/ML injection Inject 10 Units into the skin at bedtime.    [provider]  insulin regular (NOVOLIN R,HUMULIN R) 100 units/mL injection Inject into the skin 3 (three) times daily before meals. Sliding scale    [provider]  loratadine (CLARITIN) 10 MG tablet Take 10 mg by mouth daily as needed.    [provider]  LORazepam (ATIVAN) 0.5 MG tablet Take 0.5 mg by mouth as needed. 05/10/16   [provider]  Magnesium Oxide 400 MG CAPS Take 1 capsule (400 mg total) by mouth  5 (five) times daily. 09/03/16   Nicholas Lose, MD  omeprazole (PRILOSEC OTC) 20 MG tablet Take 2 tablets (40 mg total) by mouth 2 (two) times daily. take 1 tablet by mouth once daily 09/03/16   Nicholas Lose, MD  polyethylene glycol (MIRALAX / GLYCOLAX) packet Take 17 g by mouth daily as needed.    [provider]  posaconazole (NOXAFIL) 100 MG TBEC delayed-release tablet Take 300 mg by mouth daily. 06/03/16   [provider]  predniSONE (DELTASONE) 20 MG tablet Take 20 mg by mouth 3 (three) times daily.    [provider]  senna-docusate (SENOKOT-S) 8.6-50 MG tablet Take 1 tablet by mouth Nightly.    [provider]  sulfamethoxazole-trimethoprim (BACTRIM,SEPTRA) 400-80 MG  tablet Take 1 tablet by mouth daily. 07/12/16   [provider]  tacrolimus (PROGRAF) 0.5 MG capsule Take 0.5 mg by mouth daily. 06/12/16   [provider]    Physical Exam: Vitals:   07/06/17 2207 07/06/17 2249 07/06/17 2300 07/06/17 2330  BP: 101/66 100/65 104/62 (!) 103/54  Pulse: 88 86 85 82  Resp: (!) 28 (!) 25 (!) 27 (!) 25  Temp:      TempSrc:      SpO2: 99% 99% 100% 94%  Weight:      Height:        Constitutional: NAD, calm, comfortable Eyes: PERRL, lids and conjunctivae normal ENMT: Mucous membranes are moist. Posterior pharynx clear of any exudate or lesions.Normal dentition.  Neck: normal, supple, no masses, no thyromegaly Respiratory: clear to auscultation bilaterally, no wheezing, no crackles. Normal respiratory effort. No accessory muscle use.  Cardiovascular: Regular rate and rhythm, no murmurs / rubs / gallops. No extremity edema. 2+ pedal pulses. No carotid bruits.  Abdomen: no tenderness, no masses palpated. No hepatosplenomegaly. Bowel sounds positive.  Musculoskeletal: no clubbing / cyanosis. No joint deformity upper and lower extremities. Good ROM, no contractures. Normal muscle tone.  Skin: no rashes, lesions, ulcers. No induration Neurologic: CN 2-12 grossly intact. Sensation intact, DTR normal. Strength 5/5 in all 4.  Psychiatric: Normal judgment and insight. Alert and oriented x 3. Normal mood.    Labs on Admission: I have personally reviewed following labs and imaging studies  CBC: Recent Labs  Lab 07/06/17 2042  WBC 11.5*  NEUTROABS 9.8*  HGB 14.4  HCT 42.0  MCV 94.8  PLT 161   Basic Metabolic Panel: Recent Labs  Lab 07/06/17 2042  NA 136  K 4.3  CL 105  CO2 19*  GLUCOSE 153*  BUN 28*  CREATININE 2.48*  CALCIUM 8.9   GFR: Estimated Creatinine Clearance: 33.1 mL/min (A) (by C-G formula based on SCr of 2.48 mg/dL (H)). Liver Function Tests: Recent Labs  Lab 07/06/17 2042  AST 32  ALT 41  ALKPHOS 176*  BILITOT  0.7  PROT 6.7  ALBUMIN 3.7   No results for input(s): LIPASE, AMYLASE in the last 168 hours. No results for input(s): AMMONIA in the last 168 hours. Coagulation Profile: No results for input(s): INR, PROTIME in the last 168 hours. Cardiac Enzymes: No results for input(s): CKTOTAL, CKMB, CKMBINDEX, TROPONINI in the last 168 hours. BNP (last 3 results) No results for input(s): PROBNP in the last 8760 hours. HbA1C: No results for input(s): HGBA1C in the last 72 hours. CBG: No results for input(s): GLUCAP in the last 168 hours. Lipid Profile: No results for input(s): CHOL, HDL, LDLCALC, TRIG, CHOLHDL, LDLDIRECT in the last 72 hours. Thyroid Function Tests: No results for  input(s): TSH, T4TOTAL, FREET4, T3FREE, THYROIDAB in the last 72 hours. Anemia Panel: No results for input(s): VITAMINB12, FOLATE, FERRITIN, TIBC, IRON, RETICCTPCT in the last 72 hours. Urine analysis:    Component Value Date/Time   COLORURINE AMBER (A) 07/06/2017 2305   APPEARANCEUR CLOUDY (A) 07/06/2017 2305   LABSPEC 1.021 07/06/2017 2305   PHURINE 5.0 07/06/2017 2305   GLUCOSEU NEGATIVE 07/06/2017 2305   GLUCOSEU NEGATIVE 12/23/2009 0742   HGBUR NEGATIVE 07/06/2017 2305   HGBUR negative 01/24/2007 0810   BILIRUBINUR SMALL (A) 07/06/2017 2305   BILIRUBINUR negative 08/10/2014 1057   BILIRUBINUR n 02/11/2013 1059   KETONESUR 5 (A) 07/06/2017 2305   PROTEINUR 100 (A) 07/06/2017 2305   UROBILINOGEN 0.2 08/10/2014 1057   UROBILINOGEN 0.2 12/23/2009 0742   NITRITE NEGATIVE 07/06/2017 2305   LEUKOCYTESUR NEGATIVE 07/06/2017 2305    Radiological Exams on Admission: Dg Chest Port 1 View  Result Date: 07/06/2017 CLINICAL DATA:  Fever, chills and nausea.  Shortness of breath. EXAM: PORTABLE CHEST 1 VIEW COMPARISON:  01/08/2016. FINDINGS: Poor inspiration. Borderline enlarged cardiac silhouette, magnified by the poor inspiration and portable AP technique. Clear lungs with normal vascularity. Thoracic spine  degenerative changes. IMPRESSION: No acute abnormality. Electronically Signed   By: Claudie Revering M.D.   On: 07/06/2017 20:41    EKG: Independently reviewed.  Assessment/Plan Principal Problem:   Sepsis associated hypotension (HCC) Active Problems:   Essential hypertension   Steroid-induced hyperglycemia   History of stem cell transplant ([place])   GVHD (graft versus host disease) ([place])    1. Sepsis associated hypotension - vs adrenal crisis 1. IVF: 2L bolus and 125 cc/hr 2. ABx: will continue cefepime and vanc empirically for now 3. Cultures pending 4. Unclear source 5. Will empirically put patient on stress dose steroids: solucortef 100mg  IV x1 then 50mg  Q8H IV has been ordered 1. Holding his home prednisone and cortisol doses 2. H/o stem cell transplant with GVHD - 1. Continuing tacrolimus for now 2. Will hold home steroids as above 3. Continue home acyclovir 4. Continue home Noxafil 5. Holding home bactrim for now 3. HTN - hold home BP meds due to hypotension 4. AKI - 1. Likely due to hypotension 2. IVF 3. Strict intake and output 4. Repeat BMP in AM 5. Secondary DM - 1. Continue Levemir 10 QHS 2. Mod scale SSI AC  DVT prophylaxis: heparin gtt - takes full dose lovenox at home Code Status: Full Family Communication: Wife at bedside Disposition Plan: Home after admit Consults called: None Admission status: Admit to inpatient - inpatient status for sepsis associated hypotension, possible adrenal crisis   Etta Quill DO [facility] Pager [phone]  If 7AM-7PM, please contact day team taking care of patient [email or web address] Password [facility]  07/07/2017, 12:29 AM

## 2017-07-08 ENCOUNTER — Encounter: Payer: Self-pay | Admitting: Physical Therapy

## 2017-07-08 DIAGNOSIS — D469 Myelodysplastic syndrome, unspecified: Secondary | ICD-10-CM

## 2017-07-08 DIAGNOSIS — D89813 Graft-versus-host disease, unspecified: Secondary | ICD-10-CM

## 2017-07-08 DIAGNOSIS — Z9484 Stem cells transplant status: Secondary | ICD-10-CM

## 2017-07-08 DIAGNOSIS — I10 Essential (primary) hypertension: Secondary | ICD-10-CM

## 2017-07-08 DIAGNOSIS — R110 Nausea: Secondary | ICD-10-CM

## 2017-07-08 DIAGNOSIS — Z9481 Bone marrow transplant status: Secondary | ICD-10-CM

## 2017-07-08 DIAGNOSIS — N179 Acute kidney failure, unspecified: Secondary | ICD-10-CM

## 2017-07-08 DIAGNOSIS — R7989 Other specified abnormal findings of blood chemistry: Secondary | ICD-10-CM

## 2017-07-08 LAB — CBC WITH DIFFERENTIAL/PLATELET
Basophils Absolute: 0 10*3/uL (ref 0.0–0.1)
Basophils Relative: 0 %
EOS ABS: 0 10*3/uL (ref 0.0–0.7)
EOS PCT: 0 %
HCT: 32.2 % — ABNORMAL LOW (ref 39.0–52.0)
Hemoglobin: 10.6 g/dL — ABNORMAL LOW (ref 13.0–17.0)
LYMPHS ABS: 1.4 10*3/uL (ref 0.7–4.0)
Lymphocytes Relative: 14 %
MCH: 31.7 pg (ref 26.0–34.0)
MCHC: 32.9 g/dL (ref 30.0–36.0)
MCV: 96.4 fL (ref 78.0–100.0)
MONO ABS: 0.7 10*3/uL (ref 0.1–1.0)
Monocytes Relative: 7 %
Neutro Abs: 7.5 10*3/uL (ref 1.7–7.7)
Neutrophils Relative %: 79 %
PLATELETS: 262 10*3/uL (ref 150–400)
RBC: 3.34 MIL/uL — ABNORMAL LOW (ref 4.22–5.81)
RDW: 15.6 % — AB (ref 11.5–15.5)
WBC: 9.6 10*3/uL (ref 4.0–10.5)

## 2017-07-08 LAB — HEPARIN LEVEL (UNFRACTIONATED): HEPARIN UNFRACTIONATED: 0.61 [IU]/mL (ref 0.30–0.70)

## 2017-07-08 LAB — GASTROINTESTINAL PANEL BY PCR, STOOL (REPLACES STOOL CULTURE)

## 2017-07-08 LAB — PROCALCITONIN: Procalcitonin: 33.49 ng/mL

## 2017-07-08 LAB — GLUCOSE, CAPILLARY
GLUCOSE-CAPILLARY: 121 mg/dL — AB (ref 65–99)
Glucose-Capillary: 120 mg/dL — ABNORMAL HIGH (ref 65–99)
Glucose-Capillary: 143 mg/dL — ABNORMAL HIGH (ref 65–99)

## 2017-07-08 LAB — COMPREHENSIVE METABOLIC PANEL
ALBUMIN: 2.7 g/dL — AB (ref 3.5–5.0)
ALT: 24 U/L (ref 17–63)
AST: 18 U/L (ref 15–41)
Alkaline Phosphatase: 95 U/L (ref 38–126)
Anion gap: 4 — ABNORMAL LOW (ref 5–15)
BILIRUBIN TOTAL: 0.8 mg/dL (ref 0.3–1.2)
BUN: 21 mg/dL — AB (ref 6–20)
CHLORIDE: 116 mmol/L — AB (ref 101–111)
CO2: 21 mmol/L — ABNORMAL LOW (ref 22–32)
CREATININE: 1.29 mg/dL — AB (ref 0.61–1.24)
Calcium: 8.5 mg/dL — ABNORMAL LOW (ref 8.9–10.3)
GFR calc Af Amer: >60 mL/min (ref >60)
GFR calc non Af Amer: 56 mL/min — ABNORMAL LOW (ref >60)
GLUCOSE: 129 mg/dL — AB (ref 65–99)
POTASSIUM: 4 mmol/L (ref 3.5–5.1)
Sodium: 141 mmol/L (ref 135–145)
Total Protein: 5.3 g/dL — ABNORMAL LOW (ref 6.5–8.1)

## 2017-07-08 LAB — URINE CULTURE: Culture: <10000 — AB

## 2017-07-08 MED ORDER — ATENOLOL 25 MG PO TABS
25.0000 mg | ORAL_TABLET | Freq: Every day | ORAL | Status: DC
  Administered 2017-07-08: 25 mg via ORAL
  Filled 2017-07-08: qty 1

## 2017-07-08 MED ORDER — ENOXAPARIN SODIUM 100 MG/ML ~~LOC~~ SOLN
90.0000 mg | Freq: Two times a day (BID) | SUBCUTANEOUS | Status: DC
  Administered 2017-07-08 – 2017-07-09 (×3): 90 mg via SUBCUTANEOUS
  Filled 2017-07-08 (×3): qty 1

## 2017-07-08 MED ORDER — DEXTROSE 5 % IV SOLN
2.0000 g | Freq: Two times a day (BID) | INTRAVENOUS | Status: DC
  Administered 2017-07-08 – 2017-07-09 (×3): 2 g via INTRAVENOUS
  Filled 2017-07-08 (×4): qty 2

## 2017-07-08 NOTE — Progress Notes (Signed)
ANTICOAGULATION CONSULT NOTE -   Pharmacy Consult for Lovenox Indication: hx of DVT, on enoxaparin PTA  No Known Allergies  Patient Measurements: Height: 5\' 11"  (180.3 cm) Weight: 204 lb 12.9 oz (92.9 kg) IBW/kg (Calculated) : 75.3  Vital Signs: Temp: 97.7 F (36.5 C) (01/14 0400) Temp Source: Oral (01/14 0400) BP: 139/74 (01/14 0400) Pulse Rate: 77 (01/14 0400)  Labs: Recent Labs    07/06/17 2042 07/07/17 0413 07/07/17 1222 07/07/17 1931 07/08/17 0400  HGB 14.4 10.6*  --   --  10.6*  HCT 42.0 32.2*  --   --  32.2*  PLT 349 246  --   --  262  HEPARINUNFRC  --   --  0.50 0.56 0.61  CREATININE 2.48* 1.87*  --   --  1.29*   Estimated Creatinine Clearance: 64.7 mL/min (A) (by C-G formula based on SCr of 1.29 mg/dL (H)).  Medical History: Past Medical History:  Diagnosis Date  . Allergy    seasonal/animals  . Chronic prostatitis 12/17/2008  . Diverticulosis 01/16/2011   History of diverticulitis   . ERECTILE DYSFUNCTION 02/07/2007  . HYPERLIPIDEMIA 02/04/2007  . HYPERTENSION 02/04/2007  . NEOPLASM, MALIGNANT, SKIN, BACK 02/09/2009  . S/P bone marrow transplant ([place])    Assessment: 56 y/oM with PMH of bone marrow transplant 14 months ago, GvHD, MDS/AML, DVT on enoxaparin PTA who presents with n/v/d, chills, weakness, hypotension, and AKI. Pharmacy consulted to start heparin infusion while enoxaparin on hold due to AKI. Last dose of Enoxaparin 80mg  SQ q12h reported as 07/06/2017 at 0830.  07/08/2017  SCr decreasing, 1.29 today with Cl > 50 ml/min H/H low, Plts WNL No bleeding or other issues per RN   Goal of Therapy:  Heparin level 0.3-0.7 units/ml Monitor platelets by anticoagulation protocol: Yes   Plan:   Clearance improved, place back on Lovenox for hx DVT  Discontinue Heparin at 0900  Resume Lovenox 90mg  SQ q12 at 1000  Note home dose Lovenox was 80mg  q12  Monitor CBC, s/s bleed  Minda Ditto PharmD Pager [phone] 07/08/2017, 8:39 AM

## 2017-07-08 NOTE — Progress Notes (Signed)
Pharmacy Antibiotic Note  Brandon [name] is a 67 y.o. male admitted on 07/06/2017 with sepsis.  Pharmacy has been consulted for Vancomycin and Cefepime dosing.  Plan: Vancomycin discontinued  Cefepime 2g IV q24h increased to q12 hr with improved renal function Monitor renal function, cultures, clinical course.   Height: 5\' 11"  (180.3 cm) Weight: 204 lb 12.9 oz (92.9 kg) IBW/kg (Calculated) : 75.3  Temp (24hrs), Avg:97.9 F (36.6 C), Min:97.7 F (36.5 C), Max:98 F (36.7 C)  Recent Labs  Lab 07/06/17 2042 07/06/17 2052 07/06/17 2249 07/07/17 0413 07/08/17 0400  WBC 11.5*  --   --  8.3 9.6  CREATININE 2.48*  --   --  1.87* 1.29*  LATICACIDVEN  --  3.83* 1.95*  --   --     Estimated Creatinine Clearance: 64.7 mL/min (A) (by C-G formula based on SCr of 1.29 mg/dL (H)).    No Known Allergies  Antimicrobials this admission: 1/12 >> Vancomycin >> 1/14 1/12 >> Cefepime >>  Dose adjustments this admission: 1/14 Cefepime 2gm q24 > q12  Microbiology results: 1/12 BCx: ngtd 1/12 UCx: insign growth-final  1/13 CDiff: neg/neg 1/13 GI panel PCR: sent  Thank you for allowing pharmacy to be a part of this patient's care.  Minda Ditto PharmD Pager [phone] 07/08/2017, 10:14 AM

## 2017-07-08 NOTE — Progress Notes (Signed)
PROGRESS NOTE  Brandon [name] HLK:[id number] DOB: 09-05-1950 DOA: 07/06/2017 PCP: [name], MD   LOS: 1 day   Brief Narrative / Interim history: 67 year old male with history of MDS status post BMT about 1/2 years ago, followed at [facility], post transplant course complicated by graft-versus-host disease, who presented to the emergency room on 1/12 with sudden onset abdominal discomfort, nausea vomiting and watery diarrhea as well as chills and generalized weakness.  Patient denies any upper respiratory type symptoms, denies any sinus congestion, runny nose, sore throat.  Denies any sick contacts.  He has been eating out couple of times in the past 2 days eating hamburgers, as well as had some shrimp the night prior.  In the ED he was found to be septic with hypotension, tachycardia, elevated lactic acid and was placed on broad-spectrum antibiotics and admitted to the hospital  Assessment & Plan: Principal Problem:   Sepsis associated hypotension ([place]) Active Problems:   Essential hypertension   Steroid-induced hyperglycemia   History of stem cell transplant ([place])   GVHD (graft versus host disease) (HCC)  SIRS -With hypotension, status post IV fluids in the ED, hypotension resolved -Patient placed on vancomycin and cefepime, blood cultures, urine cultures were sent, no growth to date, discontinue vancomycin keep on cefepime  -Pro-calcitonin was significantly elevated at 40, improving today  -Keep on cefepime, 24 more hours, if cultures remain negative may be able to transition point empiric oral antibiotic and discharged home  Nausea / vomiting / diarrhea -Possible food poisoning given several meals out prior to this episode versus late GVHD -C. difficile was negative, GI pathogen panel still pending.  His diarrhea is now resolving suggesting infectious component at play more so than GVHD. -Able to tolerate a regular diet  Acute kidney injury -Likely in the setting of GI losses with  nausea vomiting -Creatinine has now normalized, 1.2 this morning, discontinue IV fluids  History of BMT for MDS complicated by GVHD -Obtain tacrolimus level, hold tacrolimus -Hold home steroids, place on stress dose steroids for relative adrenal insufficiency due to chronic steroid use -Continue home acyclovir, Noxavil -Discussed with his [facility] BMT MD Dr.Sarantopolous over the phone on 1/13  Steroid-induced diabetes mellitus -Continue Levemir, sliding scale, fasting CBG 120 this morning, keep on same regimen   Hypertension -Blood pressure has now improved, is no longer hypotensive, will resume atenolol  History of DVTs -On Lovenox at home, he was initially on heparin infusion as she is kidney function was worse, with a creatinine returning to normal I switched him back to Lovenox today   DVT prophylaxis:  therapeutic Lovenox Code Status: Full code Family Communication: No family at bedside Disposition Plan: Home when ready, likely within 24 hours  Consultants:   None  Procedures:   None   Antimicrobials:  Vancomycin 1/13 >> 1/14  Cefepime 1/13 >>   Subjective: -Feeling better today, no longer has diarrhea, no vomiting but had slight nausea last night.  No rashes.  No chest pain or shortness of breath.  No abdominal pain  Objective: Vitals:   07/07/17 0428 07/07/17 1518 07/07/17 1943 07/08/17 0400  BP: 98/60 109/67 113/70 139/74  Pulse: 70 69 79 77  Resp: 12 17 16 18   Temp: 98.8 F (37.1 C) 97.9 F (36.6 C) 98 F (36.7 C) 97.7 F (36.5 C)  TempSrc: Oral Oral Oral Oral  SpO2: 97% 97% 94% 97%  Weight:      Height:        Intake/Output Summary (  Last 24 hours) at 07/08/2017 1030 Last data filed at 07/08/2017 0600 Gross per 24 hour  Intake 5770.8 ml  Output -  Net 5770.8 ml   Filed Weights   07/06/17 1928 07/07/17 0202  Weight: 93 kg (205 lb) 92.9 kg (204 lb 12.9 oz)    Examination:  Constitutional: NAD Eyes: Lids and conjunctivae normal, no scleral  icterus ENMT: Moist mucous membranes, no oropharyngeal exudates Respiratory: clear to auscultation bilaterally, no wheezing, no crackles Cardiovascular: Regular rate and rhythm, no murmurs.  1+ lower extremity edema Abdomen: Soft, nontender, nondistended.  Bowel sounds positive Musculoskeletal: Normal muscle tone Skin: No rashes Neurologic: Grossly nonfocal, ambulatory Psychiatric: Alert and oriented x3   Data Reviewed: I have independently reviewed following labs and imaging studies   CBC: Recent Labs  Lab 07/06/17 2042 07/07/17 0413 07/08/17 0400  WBC 11.5* 8.3 9.6  NEUTROABS 9.8*  --  7.5  HGB 14.4 10.6* 10.6*  HCT 42.0 32.2* 32.2*  MCV 94.8 95.8 96.4  PLT 349 246 443   Basic Metabolic Panel: Recent Labs  Lab 07/06/17 2042 07/07/17 0413 07/08/17 0400  NA 136 134* 141  K 4.3 4.7 4.0  CL 105 111 116*  CO2 19* 14* 21*  GLUCOSE 153* 156* 129*  BUN 28* 26* 21*  CREATININE 2.48* 1.87* 1.29*  CALCIUM 8.9 7.4* 8.5*   GFR: Estimated Creatinine Clearance: 64.7 mL/min (A) (by C-G formula based on SCr of 1.29 mg/dL (H)). Liver Function Tests: Recent Labs  Lab 07/06/17 2042 07/08/17 0400  AST 32 18  ALT 41 24  ALKPHOS 176* 95  BILITOT 0.7 0.8  PROT 6.7 5.3*  ALBUMIN 3.7 2.7*   No results for input(s): LIPASE, AMYLASE in the last 168 hours. No results for input(s): AMMONIA in the last 168 hours. Coagulation Profile: No results for input(s): INR, PROTIME in the last 168 hours. Cardiac Enzymes: No results for input(s): CKTOTAL, CKMB, CKMBINDEX, TROPONINI in the last 168 hours. BNP (last 3 results) No results for input(s): PROBNP in the last 8760 hours. HbA1C: No results for input(s): HGBA1C in the last 72 hours. CBG: Recent Labs  Lab 07/07/17 0745 07/07/17 1159 07/07/17 1712 07/07/17 2137 07/08/17 0732  GLUCAP 152* 124* 111* 132* 120*   Lipid Profile: No results for input(s): CHOL, HDL, LDLCALC, TRIG, CHOLHDL, LDLDIRECT in the last 72 hours. Thyroid  Function Tests: No results for input(s): TSH, T4TOTAL, FREET4, T3FREE, THYROIDAB in the last 72 hours. Anemia Panel: No results for input(s): VITAMINB12, FOLATE, FERRITIN, TIBC, IRON, RETICCTPCT in the last 72 hours. Urine analysis:    Component Value Date/Time   COLORURINE AMBER (A) 07/06/2017 2305   APPEARANCEUR CLOUDY (A) 07/06/2017 2305   LABSPEC 1.021 07/06/2017 2305   PHURINE 5.0 07/06/2017 2305   GLUCOSEU NEGATIVE 07/06/2017 2305   GLUCOSEU NEGATIVE 12/23/2009 0742   HGBUR NEGATIVE 07/06/2017 2305   HGBUR negative 01/24/2007 0810   BILIRUBINUR SMALL (A) 07/06/2017 2305   BILIRUBINUR negative 08/10/2014 1057   BILIRUBINUR n 02/11/2013 1059   KETONESUR 5 (A) 07/06/2017 2305   PROTEINUR 100 (A) 07/06/2017 2305   UROBILINOGEN 0.2 08/10/2014 1057   UROBILINOGEN 0.2 12/23/2009 0742   NITRITE NEGATIVE 07/06/2017 2305   LEUKOCYTESUR NEGATIVE 07/06/2017 2305   Sepsis Labs: Invalid input(s): PROCALCITONIN, LACTICIDVEN  Recent Results (from the past 240 hour(s))  Urine culture     Status: Abnormal   Collection Time: 07/06/17 11:05 PM  Result Value Ref Range Status   Specimen Description URINE, RANDOM  Final   Special  Requests NONE  Final   Culture (A)  Final    <10,000 COLONIES/mL INSIGNIFICANT GROWTH Performed at [facility], [place] [address] [place], [place] [address]    Report Status 07/08/2017 FINAL  Final  C difficile quick scan w PCR reflex     Status: None   Collection Time: 07/07/17  9:23 AM  Result Value Ref Range Status   C Diff antigen NEGATIVE NEGATIVE Final   C Diff toxin NEGATIVE NEGATIVE Final   C Diff interpretation No C. difficile detected.  Final      Radiology Studies: Dg Chest Port 1 View  Result Date: 07/06/2017 CLINICAL DATA:  Fever, chills and nausea.  Shortness of breath. EXAM: PORTABLE CHEST 1 VIEW COMPARISON:  01/08/2016. FINDINGS: Poor inspiration. Borderline enlarged cardiac silhouette, magnified by the poor inspiration and portable AP  technique. Clear lungs with normal vascularity. Thoracic spine degenerative changes. IMPRESSION: No acute abnormality. Electronically Signed   By: Claudie Revering M.D.   On: 07/06/2017 20:41     Scheduled Meds: . acyclovir  400 mg Oral BID  . enoxaparin (LOVENOX) injection  90 mg Subcutaneous BID  . hydrocortisone sod succinate (SOLU-CORTEF) inj  50 mg Intravenous Q8H  . [START ON 07/09/2017] Influenza vac split quadrivalent PF  0.5 mL Intramuscular Tomorrow-1000  . insulin aspart  0-15 Units Subcutaneous TID WC  . insulin detemir  10 Units Subcutaneous QHS  . posaconazole  300 mg Oral Q breakfast   Continuous Infusions: . sodium chloride 125 mL/hr at 07/07/17 2214  . ceFEPime (MAXIPIME) IV 2 g (07/08/17 1028)     Marzetta Board, MD, PhD [facility] Pager [phone]  If 7PM-7AM, please contact night-coverage [email or web address] Password [email or web address] 07/08/2017, 10:30 AM

## 2017-07-08 NOTE — Progress Notes (Signed)
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  He has PMH notable for GVHD, and HTN, hyperglycemia.  I spoke with both Adama and his wife Stanton Kidney who was also in the room with him.  He tells me that diarrhea and a general unwell feeling started happening this past Saturday (two days ago) and when the nausea and diarrhea started.  Stanton Kidney tells me he got confused for a bit, and started having rigors and they called the charge nurse at [facility] and they instructed him to go to the nearest emergency department.  Gillian has undergone a chest xray, blood cultures that have NGTD, C diff that has been negative.  His is improving, and his IV fluids were stopped this morning, his Vanc discontinued, and he remains on solucortef every 8 hours.  He tells me that he has some mild nausea, from time to time, but otherwise he is feeling well.  He is happy with the excellent care he's been receiving from Dr. Renne Crigler, and that his BMT team, Dr. Corena Pilgrim has been in communication with Dr. Renne Crigler and everyone is on the same page.     Objective:  Vitals:   07/07/17 1943 07/08/17 0400  BP: 113/70 139/74  Pulse: 79 77  Resp: 16 18  Temp: 98 F (36.7 C) 97.7 F (36.5 C)  SpO2: 94% 97%    Body mass index is 28.56 kg/m.  Intake/Output Summary (Last 24 hours) at 07/08/2017 1324 Last data filed at 07/08/2017 0600 Gross per 24 hour  Intake 5770.8 ml  Output -  Net 5770.8 ml     Sclerae anicteric  Oropharynx shows no thrush or other lesions  No cervical or supraclavicular adenopathy  Lungs no rales or wheezes Heart regular rate and rhythm  Abdomen soft, +BS  Neuro nonfocal    CBG (last 3)  Recent Labs    07/07/17 2137 07/08/17 0732 07/08/17 1224  GLUCAP 132*  120* 121*     Labs:  Lab Results  Component Value Date   WBC 9.6 07/08/2017   HGB 10.6 (L) 07/08/2017   HCT 32.2 (L) 07/08/2017   MCV 96.4 07/08/2017   PLT 262 07/08/2017   NEUTROABS 7.5 07/08/2017    Urine Studies No results for input(s): UHGB, CRYS in the last 72 hours.  Invalid input(s): UACOL, UAPR, USPG, UPH, UTP, UGL, UKET, UBIL, UNIT, UROB, ULEU, UEPI, UWBC, URBC, UBAC, CAST, [place], [place]  Basic Metabolic Panel: Recent Labs  Lab 07/03/17  07/06/17 2042 07/07/17 0413 07/08/17 0400  NA 132*  --  136 134* 141  K  --    < > 4.3 4.7 4.0  CL  --   --  105 111 116*  CO2  --   --  19* 14* 21*  GLUCOSE  --   --  153* 156* 129*  BUN 21  --  28* 26* 21*  CREATININE 1.4*  --  2.48* 1.87* 1.29*  CALCIUM  --   --  8.9 7.4* 8.5*   < > = values in this interval not displayed.   GFR Estimated Creatinine Clearance: 64.7 mL/min (A) (by C-G formula based on SCr of 1.29 mg/dL (H)). Liver Function Tests: Recent Labs  Lab 07/06/17 2042 07/08/17 0400  AST 32  18  ALT 41 24  ALKPHOS 176* 95  BILITOT 0.7 0.8  PROT 6.7 5.3*  ALBUMIN 3.7 2.7*   No results for input(s): LIPASE, AMYLASE in the last 168 hours. No results for input(s): AMMONIA in the last 168 hours. Coagulation profile No results for input(s): INR, PROTIME in the last 168 hours.  CBC: Recent Labs  Lab 07/03/17 07/06/17 2042 07/07/17 0413 07/08/17 0400  WBC 10.1 11.5* 8.3 9.6  NEUTROABS  --  9.8*  --  7.5  HGB  --  14.4 10.6* 10.6*  HCT  --  42.0 32.2* 32.2*  MCV  --  94.8 95.8 96.4  PLT  --  349 246 262   Cardiac Enzymes: No results for input(s): CKTOTAL, CKMB, CKMBINDEX, TROPONINI in the last 168 hours. BNP: Invalid input(s): POCBNP CBG: Recent Labs  Lab 07/07/17 1159 07/07/17 1712 07/07/17 2137 07/08/17 0732 07/08/17 1224  GLUCAP 124* 111* 132* 120* 121*   D-Dimer No results for input(s): DDIMER in the last 72 hours. Hgb A1c No results for input(s): HGBA1C in the last 72 hours. Lipid  Profile No results for input(s): CHOL, HDL, LDLCALC, TRIG, CHOLHDL, LDLDIRECT in the last 72 hours. Thyroid function studies No results for input(s): TSH, T4TOTAL, T3FREE, THYROIDAB in the last 72 hours.  Invalid input(s): FREET3 Anemia work up No results for input(s): VITAMINB12, FOLATE, FERRITIN, TIBC, IRON, RETICCTPCT in the last 72 hours. Microbiology Recent Results (from the past 240 hour(s))  Urine culture     Status: Abnormal   Collection Time: 07/06/17 11:05 PM  Result Value Ref Range Status   Specimen Description URINE, RANDOM  Final   Special Requests NONE  Final   Culture (A)  Final    <10,000 COLONIES/mL INSIGNIFICANT GROWTH Performed at [facility], [place] [address] [place], [place] [address]    Report Status 07/08/2017 FINAL  Final  Gastrointestinal Panel by PCR , Stool     Status: None   Collection Time: 07/07/17  9:23 AM  Result Value Ref Range Status   Campylobacter species NOT DETECTED NOT DETECTED Final   Plesimonas shigelloides NOT DETECTED NOT DETECTED Final   Salmonella species NOT DETECTED NOT DETECTED Final   Yersinia enterocolitica NOT DETECTED NOT DETECTED Final   Vibrio species NOT DETECTED NOT DETECTED Final   Vibrio cholerae NOT DETECTED NOT DETECTED Final   Enteroaggregative E coli (EAEC) NOT DETECTED NOT DETECTED Final   Enteropathogenic E coli (EPEC) NOT DETECTED NOT DETECTED Final   Enterotoxigenic E coli (ETEC) NOT DETECTED NOT DETECTED Final   Shiga like toxin producing E coli (STEC) NOT DETECTED NOT DETECTED Final   Shigella/Enteroinvasive E coli (EIEC) NOT DETECTED NOT DETECTED Final   Cryptosporidium NOT DETECTED NOT DETECTED Final   Cyclospora cayetanensis NOT DETECTED NOT DETECTED Final   Entamoeba histolytica NOT DETECTED NOT DETECTED Final   Giardia lamblia NOT DETECTED NOT DETECTED Final   Adenovirus F40/41 NOT DETECTED NOT DETECTED Final   Astrovirus NOT DETECTED NOT DETECTED Final   Norovirus GI/GII NOT DETECTED NOT DETECTED  Final   Rotavirus A NOT DETECTED NOT DETECTED Final   Sapovirus (I, II, IV, and V) NOT DETECTED NOT DETECTED Final    Comment: Performed at [facility], [address] [place],  [address]  C difficile quick scan w PCR reflex     Status: None   Collection Time: 07/07/17  9:23 AM  Result Value Ref Range Status   C Diff antigen NEGATIVE NEGATIVE Final   C  Diff toxin NEGATIVE NEGATIVE Final   C Diff interpretation No C. difficile detected.  Final      Studies:  Dg Chest Port 1 View  Result Date: 07/06/2017 CLINICAL DATA:  Fever, chills and nausea.  Shortness of breath. EXAM: PORTABLE CHEST 1 VIEW COMPARISON:  01/08/2016. FINDINGS: Poor inspiration. Borderline enlarged cardiac silhouette, magnified by the poor inspiration and portable AP technique. Clear lungs with normal vascularity. Thoracic spine degenerative changes. IMPRESSION: No acute abnormality. Electronically Signed   By: Claudie Revering M.D.   On: 07/06/2017 20:41    Assessment/Plan: 67 y.o. male with MDS 14 months s/p MUD allogeneic SCT admitted with sepsis, nausea, vomiting, diarrhea, rigors and weakness.  1. Nausea, vomiting, and diarrhea: Diarrhea has resolved, still mildly nauseated, but has had this in the past with holding Tacrolimus.  Agree not likely GVHD since diarrhea stopped so quickly. GI panel negative, C diff negative.    2. MDS s/p bone marrow transplant: Patient is continuing on prophylaxis with Posaconazole and Acyclovir.  Tacrolimus on hold. On stress dose steroids for adrenal insufficiency.  3. Sepsis: Cefepime q12h, Vanc was discontinued.  Continue to await culture results, they show no growth to date.  Agree with empiric oral antibiotic plan per Dr. Renne Crigler.    Code status: Full code  Happy to see patient as an outpatient for follow up lab work as needed for coordination with [facility] and Dr. [name].  Appreciate the hospitalists team for their excellent care of this patient!      Scot Dock, NP 07/08/2017  1:24 PM Medical Oncology and Hematology [facility] [address] [place], [place] [address] Tel. [phone]    Fax. [phone]  Attending Note  I personally saw the patient, reviewed the chart and examined the patient. The plan of care was discussed with the patient . I agree with the assessment and plan as documented above.  Nausea vomiting diarrhea: Most probably infectious in nature although GVHD cannot be entirely ruled out.  No evidence of C. Difficile MDS/acute leukemia status post matched unrelated donor 15 months ago the patient follows at [facility]. I greatly appreciate the fantastic care of Dr.Gherge and his team are providing the patient. I am available if there are any specific questions or concerns. Patient will return back to see his bone marrow transplant physician on follow-up.

## 2017-07-09 DIAGNOSIS — E860 Dehydration: Secondary | ICD-10-CM

## 2017-07-09 LAB — GLUCOSE, CAPILLARY
GLUCOSE-CAPILLARY: 104 mg/dL — AB (ref 65–99)
Glucose-Capillary: 111 mg/dL — ABNORMAL HIGH (ref 65–99)

## 2017-07-09 LAB — CBC
HCT: 31 % — ABNORMAL LOW (ref 39.0–52.0)
Hemoglobin: 10.2 g/dL — ABNORMAL LOW (ref 13.0–17.0)
MCH: 32.1 pg (ref 26.0–34.0)
MCHC: 32.9 g/dL (ref 30.0–36.0)
MCV: 97.5 fL (ref 78.0–100.0)
PLATELETS: 233 10*3/uL (ref 150–400)
RBC: 3.18 MIL/uL — AB (ref 4.22–5.81)
RDW: 15.4 % (ref 11.5–15.5)
WBC: 8.3 10*3/uL (ref 4.0–10.5)

## 2017-07-09 LAB — TACROLIMUS LEVEL: TACROLIMUS (FK506) - LABCORP: 4.5 ng/mL (ref 2.0–20.0)

## 2017-07-09 LAB — BASIC METABOLIC PANEL
Anion gap: 8 (ref 5–15)
BUN: 13 mg/dL (ref 6–20)
CALCIUM: 8.3 mg/dL — AB (ref 8.9–10.3)
CO2: 20 mmol/L — ABNORMAL LOW (ref 22–32)
CREATININE: 1.11 mg/dL (ref 0.61–1.24)
Chloride: 111 mmol/L (ref 101–111)
GFR calc Af Amer: >60 mL/min (ref >60)
Glucose, Bld: 95 mg/dL (ref 65–99)
POTASSIUM: 3.8 mmol/L (ref 3.5–5.1)
SODIUM: 139 mmol/L (ref 135–145)

## 2017-07-09 LAB — PROCALCITONIN: Procalcitonin: 18.73 ng/mL

## 2017-07-09 MED ORDER — PREDNISONE 10 MG PO TABS: 30.0000 mg | ORAL_TABLET | Freq: Every day | ORAL | 0 refills | Status: AC

## 2017-07-09 MED ORDER — ENOXAPARIN SODIUM 80 MG/0.8ML ~~LOC~~ SOLN: 80.0000 mg | Freq: Two times a day (BID) | SUBCUTANEOUS | Status: DC

## 2017-07-09 MED ORDER — LEVOFLOXACIN 500 MG PO TABS: 500.0000 mg | ORAL_TABLET | Freq: Every day | ORAL | 0 refills | Status: DC

## 2017-07-09 NOTE — Discharge Instructions (Signed)
Follow with Dr Lindi Adie in 1-2 weeks  Take 30 mg prednisone daily for 3 days then resume your prior prednisone dose Take levaquin (antibiotic) for 4 more days to complete a total of 7 day course  Please get a complete blood count and chemistry panel checked by your Primary MD at your next visit, and again as instructed by your Primary MD. Please get your medications reviewed and adjusted by your Primary MD.  Please request your Primary MD to go over all Hospital Tests and Procedure/Radiological results at the follow up, please get all Hospital records sent to your Prim MD by signing hospital release before you go home.  If you had Pneumonia of Lung problems at the Hospital: Please get a 2 view Chest X ray done in 6-8 weeks after hospital discharge or sooner if instructed by your Primary MD.  If you have Congestive Heart Failure: Please call your Cardiologist or Primary MD anytime you have any of the following symptoms:  1) 3 pound weight gain in 24 hours or 5 pounds in 1 week  2) shortness of breath, with or without a dry hacking cough  3) swelling in the hands, feet or stomach  4) if you have to sleep on extra pillows at night in order to breathe  Follow cardiac low salt diet and 1.5 lit/day fluid restriction.  If you have diabetes Accuchecks 4 times/day, Once in AM empty stomach and then before each meal. Log in all results and show them to your primary doctor at your next visit. If any glucose reading is under 80 or above 300 call your primary MD immediately.  If you have Seizure/Convulsions/Epilepsy: Please do not drive, operate heavy machinery, participate in activities at heights or participate in high speed sports until you have seen by Primary MD or a Neurologist and advised to do so again.  If you had Gastrointestinal Bleeding: Please ask your Primary MD to check a complete blood count within one week of discharge or at your next visit. Your endoscopic/colonoscopic biopsies that  are pending at the time of discharge, will also need to followed by your Primary MD.  Get Medicines reviewed and adjusted. Please take all your medications with you for your next visit with your Primary MD  Please request your Primary MD to go over all hospital tests and procedure/radiological results at the follow up, please ask your Primary MD to get all Hospital records sent to his/her office.  If you experience worsening of your admission symptoms, develop shortness of breath, life threatening emergency, suicidal or homicidal thoughts you must seek medical attention immediately by calling 911 or calling your MD immediately  if symptoms less severe.  You must read complete instructions/literature along with all the possible adverse reactions/side effects for all the Medicines you take and that have been prescribed to you. Take any new Medicines after you have completely understood and accpet all the possible adverse reactions/side effects.   Do not drive or operate heavy machinery when taking Pain medications.   Do not take more than prescribed Pain, Sleep and Anxiety Medications  Special Instructions: If you have smoked or chewed Tobacco  in the last 2 yrs please stop smoking, stop any regular Alcohol  and or any Recreational drug use.  Wear Seat belts while driving.  Please note You were cared for by a hospitalist during your hospital stay. If you have any questions about your discharge medications or the care you received while you were in the hospital after  you are discharged, you can call the unit and asked to speak with the hospitalist on call if the hospitalist that took care of you is not available. Once you are discharged, your primary care physician will handle any further medical issues. Please note that NO REFILLS for any discharge medications will be authorized once you are discharged, as it is imperative that you return to your primary care physician (or establish a relationship  with a primary care physician if you do not have one) for your aftercare needs so that they can reassess your need for medications and monitor your lab values.  You can reach the hospitalist office at phone [phone] or fax [phone]   If you do not have a primary care physician, you can call [phone] for a physician referral.  Activity: As tolerated with Full fall precautions use walker/cane & assistance as needed  Diet: regular  Disposition Home

## 2017-07-09 NOTE — Discharge Summary (Signed)
Physician Discharge Summary  Brandon [name] IOE:[id number] DOB: 23-May-1951 DOA: 07/06/2017  PCP: [name], MD  Admit date: 07/06/2017 Discharge date: 07/09/2017  Admitted From: home Disposition:  home  Recommendations for Outpatient Follow-up:  1. Follow up with Dr. Lindi Adie in 2-3 weeks 2. Follow up with Dr. [name] as scheduled   Home Health: none Equipment/Devices: none  Discharge Condition: stable CODE STATUS: Full code Diet recommendation: regular  HPI: Per Dr. Alcario Drought, Brandon [name] is a 67 y.o. 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  Presents to the ED with c/o fevers onset today.  Tm 101.  Chills.  Generalized weakness.  N/V/D.  No CP, no abd pain, no dysuria, no rash, no neck stiffness, no headache, no cough, no other localizing symptoms. ED Course: BP initially 90 systolic, and tachy to low 100s.  Improves to 110 now after 2L NS bolus.  Given empiric cefepime and vanc in ED for sepsis of unknown source.   Hospital Course: SIRS -patient admitted to the hospital with SIRS, and with abdominal pain nausea vomiting or diarrhea concern for gastroenteritis.  Given immunosuppression due to home medications he was placed on broad-spectrum antibiotics of vancomycin and cefepime.  His blood cultures have remained negative, his urine culture has remained negative as well.  CT and GI pathogen panel were negative.  He improved rapidly within 24 hours, his abdominal pain, nausea vomiting and diarrhea have resolved, he is able to tolerate a regular diet, he is afebrile and white count is normal and was discharged home in stable condition with empiric Levaquin for 4 additional days to complete a 7-day course. 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  Resume medications prior to admission. Steroid-induced diabetes mellitus -continue home regimen on discharge Hypertension -continue home regimen on d/c History of DVTs -continue Lovenox  Discharge Diagnoses:  Principal Problem:   Sepsis associated hypotension ([place]) Active Problems:   Essential hypertension   Steroid-induced hyperglycemia   History of stem cell transplant ([place])   GVHD (graft versus host disease) ([place])   Discharge Instructions   Allergies as of 07/09/2017   No Known Allergies     Medication List    TAKE these medications   acyclovir 400 MG tablet Commonly known as:  ZOVIRAX Take 400 mg by mouth 2 (two) times daily.   atenolol 50 MG tablet Commonly known as:  TENORMIN Take 1 tablet (50 mg total) by mouth daily. What changed:    how much to take  when to take this   cholecalciferol 1000 units tablet Commonly known as:  VITAMIN D Take 1,000 Units by mouth daily.   enoxaparin 80 MG/0.8ML injection Commonly known as:  LOVENOX Inject 80 mg into the skin every 12 (twelve) hours.   fluticasone 50 MCG/ACT nasal spray Commonly known as:  FLONASE Place 2 sprays into both nostrils at bedtime.   folic acid 182 MCG tablet Commonly known as:  FOLVITE Take 400 mcg by mouth 2 (two) times daily.   hydrocortisone 10 MG tablet Commonly known as:  CORTEF Take 10-20 mg by mouth 2 (two) times daily. Take 2 tablets in AM and 1 tablet in PM   insulin detemir 100 UNIT/ML  injection Commonly known as:  LEVEMIR Inject 10 Units into the skin at bedtime.   insulin regular 100 units/mL injection Commonly known as:  NOVOLIN R,HUMULIN R Inject into the skin 3 (three) times daily before meals. Sliding scale   levofloxacin 500 MG tablet Commonly known as:  LEVAQUIN Take 1 tablet (500 mg total) by mouth daily for 10 days.   loratadine 10 MG tablet Commonly  known as:  CLARITIN Take 10 mg by mouth daily as needed.   LORazepam 0.5 MG tablet Commonly known as:  ATIVAN Take 0.5 mg by mouth as needed.   Magnesium Oxide 400 MG Caps Take 1 capsule (400 mg total) by mouth 5 (five) times daily.   omeprazole 20 MG tablet Commonly known as:  PRILOSEC OTC Take 2 tablets (40 mg total) by mouth 2 (two) times daily. take 1 tablet by mouth once daily   polyethylene glycol packet Commonly known as:  MIRALAX / GLYCOLAX Take 17 g by mouth daily as needed.   posaconazole 100 MG Tbec delayed-release tablet Commonly known as:  NOXAFIL Take 300 mg by mouth daily.   predniSONE 10 MG tablet Commonly known as:  DELTASONE Take 3 tablets (30 mg total) by mouth daily for 3 days. Afterwards resume your chronic prednisone dose What changed:    medication strength  how much to take  when to take this  additional instructions   senna-docusate 8.6-50 MG tablet Commonly known as:  Senokot-S Take 1 tablet by mouth Nightly.   sulfamethoxazole-trimethoprim 400-80 MG tablet Commonly known as:  BACTRIM,SEPTRA Take 1 tablet by mouth daily.   tacrolimus 0.5 MG capsule Commonly known as:  PROGRAF Take 0.5 mg by mouth 2 (two) times daily.      Follow-up Information    Nicholas Lose, MD. Schedule an appointment as soon as possible for a visit in 2 week(s).   Specialty:  Hematology and Oncology Contact information: [place] [id number] [phone]           Consultations:  Oncology   Procedures/Studies:  Dg Chest Port 1 View  Result Date: 07/06/2017 CLINICAL DATA:  Fever, chills and nausea.  Shortness of breath. EXAM: PORTABLE CHEST 1 VIEW COMPARISON:  01/08/2016. FINDINGS: Poor inspiration. Borderline enlarged cardiac silhouette, magnified by the poor inspiration and portable AP technique. Clear lungs with normal vascularity. Thoracic spine degenerative changes. IMPRESSION: No acute abnormality. Electronically  Signed   By: Claudie Revering M.D.   On: 07/06/2017 20:41     Subjective: - no chest pain, shortness of breath, no abdominal pain, nausea or vomiting.   Discharge Exam: Vitals:   07/08/17 2252 07/09/17 0643  BP: 122/73 117/77  Pulse: 65 60  Resp: 18 18  Temp: 98.4 F (36.9 C) 98.2 F (36.8 C)  SpO2: 97% 98%    General: Pt is alert, awake, not in acute distress Cardiovascular: RRR, S1/S2 +, no rubs, no gallops Respiratory: CTA bilaterally, no wheezing, no rhonchi Abdominal: Soft, NT, ND, bowel sounds + Extremities: no edema, no cyanosis   The results of significant diagnostics from this hospitalization (including imaging, microbiology, ancillary and laboratory) are listed below for reference.     Microbiology: Recent Results (from the past 240 hour(s))  Blood Culture (routine x 2)     Status: None (Preliminary result)   Collection Time: 07/06/17  8:43 PM  Result Value Ref Range Status   Specimen Description BLOOD LEFT ANTECUBITAL  Final   Special Requests   Final    BOTTLES  DRAWN AEROBIC AND ANAEROBIC Blood Culture adequate volume   Culture   Final    NO GROWTH 2 DAYS Performed at [facility], [place] [address] [place], [place] [address]    Report Status PENDING  Incomplete  Urine culture     Status: Abnormal   Collection Time: 07/06/17 11:05 PM  Result Value Ref Range Status   Specimen Description URINE, RANDOM  Final   Special Requests NONE  Final   Culture (A)  Final    <10,000 COLONIES/mL INSIGNIFICANT GROWTH Performed at [facility], [place] [address], [place], [place] [address]    Report Status 07/08/2017 FINAL  Final  Blood Culture (routine x 2)     Status: None (Preliminary result)   Collection Time: 07/07/17  4:13 AM  Result Value Ref Range Status   Specimen Description BLOOD LEFT HAND  Final   Special Requests IN PEDIATRIC BOTTLE Blood Culture adequate volume  Final   Culture   Final    NO GROWTH 2 DAYS Performed at [facility],  [place] [address] [place],  [address]    Report Status PENDING  Incomplete  Gastrointestinal Panel by PCR , Stool     Status: None   Collection Time: 07/07/17  9:23 AM  Result Value Ref Range Status   Campylobacter species NOT DETECTED NOT DETECTED Final   Plesimonas shigelloides NOT DETECTED NOT DETECTED Final   Salmonella species NOT DETECTED NOT DETECTED Final   Yersinia enterocolitica NOT DETECTED NOT DETECTED Final   Vibrio species NOT DETECTED NOT DETECTED Final   Vibrio cholerae NOT DETECTED NOT DETECTED Final   Enteroaggregative E coli (EAEC) NOT DETECTED NOT DETECTED Final   Enteropathogenic E coli (EPEC) NOT DETECTED NOT DETECTED Final   Enterotoxigenic E coli (ETEC) NOT DETECTED NOT DETECTED Final   Shiga like toxin producing E coli (STEC) NOT DETECTED NOT DETECTED Final   Shigella/Enteroinvasive E coli (EIEC) NOT DETECTED NOT DETECTED Final   Cryptosporidium NOT DETECTED NOT DETECTED Final   Cyclospora cayetanensis NOT DETECTED NOT DETECTED Final   Entamoeba histolytica NOT DETECTED NOT DETECTED Final   Giardia lamblia NOT DETECTED NOT DETECTED Final   Adenovirus F40/41 NOT DETECTED NOT DETECTED Final   Astrovirus NOT DETECTED NOT DETECTED Final   Norovirus GI/GII NOT DETECTED NOT DETECTED Final   Rotavirus A NOT DETECTED NOT DETECTED Final   Sapovirus (I, II, IV, and V) NOT DETECTED NOT DETECTED Final    Comment: Performed at [facility], [place] [place],  [address]  C difficile quick scan w PCR reflex     Status: None   Collection Time: 07/07/17  9:23 AM  Result Value Ref Range Status   C Diff antigen NEGATIVE NEGATIVE Final   C Diff toxin NEGATIVE NEGATIVE Final   C Diff interpretation No C. difficile detected.  Final     Labs: BNP (last 3 results) No results for input(s): BNP in the last 8760 hours. Basic Metabolic Panel: Recent Labs  Lab 07/03/17 07/06/17 2042 07/07/17 0413 07/08/17 0400 07/09/17 0345  NA 132* 136 134* 141 139   K  --  4.3 4.7 4.0 3.8  CL  --  105 111 116* 111  CO2  --  19* 14* 21* 20*  GLUCOSE  --  153* 156* 129* 95  BUN 21 28* 26* 21* 13  CREATININE 1.4* 2.48* 1.87* 1.29* 1.11  CALCIUM  --  8.9 7.4* 8.5* 8.3*   Liver Function Tests: Recent  Labs  Lab 07/06/17 2042 07/08/17 0400  AST 32 18  ALT 41 24  ALKPHOS 176* 95  BILITOT 0.7 0.8  PROT 6.7 5.3*  ALBUMIN 3.7 2.7*   No results for input(s): LIPASE, AMYLASE in the last 168 hours. No results for input(s): AMMONIA in the last 168 hours. CBC: Recent Labs  Lab 07/03/17 07/06/17 2042 07/07/17 0413 07/08/17 0400 07/09/17 0345  WBC 10.1 11.5* 8.3 9.6 8.3  NEUTROABS  --  9.8*  --  7.5  --   HGB  --  14.4 10.6* 10.6* 10.2*  HCT  --  42.0 32.2* 32.2* 31.0*  MCV  --  94.8 95.8 96.4 97.5  PLT  --  349 246 262 233   Cardiac Enzymes: No results for input(s): CKTOTAL, CKMB, CKMBINDEX, TROPONINI in the last 168 hours. BNP: Invalid input(s): POCBNP CBG: Recent Labs  Lab 07/08/17 0732 07/08/17 1224 07/08/17 1707 07/09/17 0736 07/09/17 1223  GLUCAP 120* 121* 143* 104* 111*   D-Dimer No results for input(s): DDIMER in the last 72 hours. Hgb A1c No results for input(s): HGBA1C in the last 72 hours. Lipid Profile No results for input(s): CHOL, HDL, LDLCALC, TRIG, CHOLHDL, LDLDIRECT in the last 72 hours. Thyroid function studies No results for input(s): TSH, T4TOTAL, T3FREE, THYROIDAB in the last 72 hours.  Invalid input(s): FREET3 Anemia work up No results for input(s): VITAMINB12, FOLATE, FERRITIN, TIBC, IRON, RETICCTPCT in the last 72 hours. Urinalysis    Component Value Date/Time   COLORURINE AMBER (A) 07/06/2017 2305   APPEARANCEUR CLOUDY (A) 07/06/2017 2305   LABSPEC 1.021 07/06/2017 2305   PHURINE 5.0 07/06/2017 2305   GLUCOSEU NEGATIVE 07/06/2017 2305   GLUCOSEU NEGATIVE 12/23/2009 0742   HGBUR NEGATIVE 07/06/2017 2305   HGBUR negative 01/24/2007 0810   BILIRUBINUR SMALL (A) 07/06/2017 2305   BILIRUBINUR negative  08/10/2014 1057   BILIRUBINUR n 02/11/2013 1059   KETONESUR 5 (A) 07/06/2017 2305   PROTEINUR 100 (A) 07/06/2017 2305   UROBILINOGEN 0.2 08/10/2014 1057   UROBILINOGEN 0.2 12/23/2009 0742   NITRITE NEGATIVE 07/06/2017 2305   LEUKOCYTESUR NEGATIVE 07/06/2017 2305   Sepsis Labs Invalid input(s): PROCALCITONIN,  WBC,  LACTICIDVEN   Time coordinating discharge: 41 minutes  SIGNED:  Marzetta Board, MD  [facility] 07/09/2017, 12:32 PM Pager [phone]  If 7PM-7AM, please contact night-coverage [email or web address] Password [email or web address]

## 2017-07-09 NOTE — Progress Notes (Signed)
ANTICOAGULATION CONSULT NOTE  Pharmacy Consult for Enoxaparin Indication: hx of DVT, on Enoxaparin PTA  No Known Allergies  Patient Measurements: Height: 5\' 11"  (180.3 cm) Weight: 204 lb 12.9 oz (92.9 kg) IBW/kg (Calculated) : 75.3  Vital Signs: Temp: 98.2 F (36.8 C) (01/15 0643) Temp Source: Oral (01/15 0643) BP: 117/77 (01/15 0643) Pulse Rate: 60 (01/15 0643)  Labs: Recent Labs    07/07/17 0413 07/07/17 1222 07/07/17 1931 07/08/17 0400 07/09/17 0345  HGB 10.6*  --   --  10.6* 10.2*  HCT 32.2*  --   --  32.2* 31.0*  PLT 246  --   --  262 233  HEPARINUNFRC  --  0.50 0.56 0.61  --   CREATININE 1.87*  --   --  1.29* 1.11   Estimated Creatinine Clearance: 75.2 mL/min (by C-G formula based on SCr of 1.11 mg/dL).  Medical History: Past Medical History:  Diagnosis Date  . Allergy    seasonal/animals  . Chronic prostatitis 12/17/2008  . Diverticulosis 01/16/2011   History of diverticulitis   . ERECTILE DYSFUNCTION 02/07/2007  . HYPERLIPIDEMIA 02/04/2007  . HYPERTENSION 02/04/2007  . NEOPLASM, MALIGNANT, SKIN, BACK 02/09/2009  . S/P bone marrow transplant ([place])    Assessment: 1 y/oM with PMH of bone marrow transplant 14 months ago, GvHD, MDS/AML, DVT on enoxaparin PTA who presents with n/v/d, chills, weakness, hypotension, and AKI. Pharmacy initially consulted to start heparin infusion while enoxaparin on hold due to AKI. Last dose of Enoxaparin 80mg  SQ q12h reported as 07/06/2017 at 0830. Enoxaparin resumed on 1/14 with improvement in renal function.   07/09/2017  SCr improved to WNL, CrCl ~ 75 ml/min  H/H low, but stable. Pltc WNL No bleeding or other issues per RN  Goal of Therapy:  Heparin level 0.3-0.7 units/ml Monitor platelets by anticoagulation protocol: Yes   Plan:   Adjust Enoxaparin back to home dose of 80mg  SQ q12h (per Care Everywhere notes, was reduced from 100mg  SQ q12h to 80mg  SQ q12h for high LMWH level and bruising-renal function similar at that  time).   Monitor CBC at least q72h.  Monitor for s/sx of bleeding.    Lindell Spar, PharmD, BCPS Pager: [phone] 07/09/2017 11:23 AM

## 2017-07-10 ENCOUNTER — Telehealth: Payer: Self-pay | Admitting: *Deleted

## 2017-07-10 LAB — GLUCOSE, CAPILLARY: Glucose-Capillary: 113 mg/dL — ABNORMAL HIGH (ref 65–99)

## 2017-07-10 NOTE — Telephone Encounter (Addendum)
Per Chart review: PCP: [name], MD  Admit date: 07/06/2017 Discharge date: 07/09/2017  Admitted From: home Disposition:  home  Recommendations for Outpatient Follow-up:  1. Follow up with Dr. Lindi Adie in 2-3 weeks 2. Follow up with Dr. [name] as scheduled   Home Health: none Equipment/Devices: none  Discharge Condition: stable CODE STATUS: Full code Diet recommendation: regular  __________________________________________________________ Transition Care Management Follow-up Telephone Call   Date discharged? 07/09/17   How have you been since you were released from the hospital? "pretty good"   Do you understand why you were in the hospital? yes   Do you understand the discharge instructions? yes   Where were you discharged to? Home   Items Reviewed:  Medications reviewed: yes  Allergies reviewed: yes  Dietary changes reviewed: yes  Referrals reviewed: yes   Functional Questionnaire:   Activities of Daily Living (ADLs):   He states they are independent in the following: ambulation States they require assistance with the following: none   Any transportation issues/concerns?: no   Any patient concerns? no   Confirmed importance and date/time of follow-up visits scheduled no Patient states that he is following up with his oncologist and transplant doctor. He says if Dr Yong Channel feels strongly that he needs to come in for hospital follow up with him then we will schedule one. He appreciates the office checking on him.  Confirmed with patient if condition begins to worsen call PCP or go to the ER.  Patient was given the office number and encouraged to call back with question or concerns.  : yes

## 2017-07-10 NOTE — Progress Notes (Signed)
Spoke with pharmacist at [facility] and clarified antibiotic duration. patient will complete 7 days of levofloxacin on 07/13/2017.

## 2017-07-11 ENCOUNTER — Encounter: Payer: Self-pay | Admitting: Hematology and Oncology

## 2017-07-11 NOTE — Telephone Encounter (Signed)
Patient already has follow up planned- Im ok with no TCM visit as long as he is comfortable as well. I am comfortable with him seeing [place] as needed- Brandon [name] is good about getting in when needed and following up through mychart with me about sugars/progress.

## 2017-07-12 LAB — CULTURE, BLOOD (ROUTINE X 2)
Culture: NO GROWTH
Culture: NO GROWTH
SPECIAL REQUESTS: ADEQUATE
SPECIAL REQUESTS: ADEQUATE

## 2017-07-15 ENCOUNTER — Inpatient Hospital Stay (HOSPITAL_COMMUNITY): Payer: Medicare Other

## 2017-07-15 ENCOUNTER — Encounter (HOSPITAL_COMMUNITY): Payer: Self-pay | Admitting: Nurse Practitioner

## 2017-07-15 ENCOUNTER — Inpatient Hospital Stay (HOSPITAL_COMMUNITY)
Admission: EM | Admit: 2017-07-15 | Discharge: 2017-07-21 | DRG: 872 | Disposition: A | Discharge status: Home / Self Care | Payer: Medicare Other | Attending: Internal Medicine | Admitting: Internal Medicine

## 2017-07-15 ENCOUNTER — Other Ambulatory Visit: Payer: Self-pay

## 2017-07-15 ENCOUNTER — Emergency Department (HOSPITAL_COMMUNITY): Payer: Medicare Other

## 2017-07-15 DIAGNOSIS — Z794 Long term (current) use of insulin: Secondary | ICD-10-CM | POA: Diagnosis not present

## 2017-07-15 DIAGNOSIS — Z862 Personal history of diseases of the blood and blood-forming organs and certain disorders involving the immune mechanism: Secondary | ICD-10-CM | POA: Diagnosis present

## 2017-07-15 DIAGNOSIS — R739 Hyperglycemia, unspecified: Secondary | ICD-10-CM | POA: Diagnosis not present

## 2017-07-15 DIAGNOSIS — T380X5A Adverse effect of glucocorticoids and synthetic analogues, initial encounter: Secondary | ICD-10-CM | POA: Diagnosis present

## 2017-07-15 DIAGNOSIS — R509 Fever, unspecified: Secondary | ICD-10-CM | POA: Diagnosis not present

## 2017-07-15 DIAGNOSIS — R197 Diarrhea, unspecified: Secondary | ICD-10-CM | POA: Diagnosis present

## 2017-07-15 DIAGNOSIS — I493 Ventricular premature depolarization: Secondary | ICD-10-CM | POA: Diagnosis present

## 2017-07-15 DIAGNOSIS — D469 Myelodysplastic syndrome, unspecified: Secondary | ICD-10-CM | POA: Diagnosis not present

## 2017-07-15 DIAGNOSIS — Z9484 Stem cells transplant status: Secondary | ICD-10-CM

## 2017-07-15 DIAGNOSIS — D696 Thrombocytopenia, unspecified: Secondary | ICD-10-CM | POA: Diagnosis not present

## 2017-07-15 DIAGNOSIS — I82511 Chronic embolism and thrombosis of right femoral vein: Secondary | ICD-10-CM | POA: Diagnosis present

## 2017-07-15 DIAGNOSIS — A419 Sepsis, unspecified organism: Principal | ICD-10-CM | POA: Diagnosis present

## 2017-07-15 DIAGNOSIS — J302 Other seasonal allergic rhinitis: Secondary | ICD-10-CM | POA: Diagnosis present

## 2017-07-15 DIAGNOSIS — R001 Bradycardia, unspecified: Secondary | ICD-10-CM | POA: Diagnosis not present

## 2017-07-15 DIAGNOSIS — I10 Essential (primary) hypertension: Secondary | ICD-10-CM | POA: Diagnosis present

## 2017-07-15 DIAGNOSIS — E099 Drug or chemical induced diabetes mellitus without complications: Secondary | ICD-10-CM | POA: Diagnosis present

## 2017-07-15 DIAGNOSIS — N179 Acute kidney failure, unspecified: Secondary | ICD-10-CM | POA: Diagnosis present

## 2017-07-15 DIAGNOSIS — Z9481 Bone marrow transplant status: Secondary | ICD-10-CM | POA: Diagnosis not present

## 2017-07-15 DIAGNOSIS — E2740 Unspecified adrenocortical insufficiency: Secondary | ICD-10-CM | POA: Diagnosis present

## 2017-07-15 DIAGNOSIS — E876 Hypokalemia: Secondary | ICD-10-CM | POA: Diagnosis present

## 2017-07-15 DIAGNOSIS — I82411 Acute embolism and thrombosis of right femoral vein: Secondary | ICD-10-CM | POA: Diagnosis present

## 2017-07-15 DIAGNOSIS — R112 Nausea with vomiting, unspecified: Secondary | ICD-10-CM

## 2017-07-15 DIAGNOSIS — R6510 Systemic inflammatory response syndrome (SIRS) of non-infectious origin without acute organ dysfunction: Secondary | ICD-10-CM

## 2017-07-15 DIAGNOSIS — D89813 Graft-versus-host disease, unspecified: Secondary | ICD-10-CM | POA: Diagnosis present

## 2017-07-15 DIAGNOSIS — T451X5A Adverse effect of antineoplastic and immunosuppressive drugs, initial encounter: Secondary | ICD-10-CM | POA: Diagnosis present

## 2017-07-15 DIAGNOSIS — Z79899 Other long term (current) drug therapy: Secondary | ICD-10-CM | POA: Diagnosis not present

## 2017-07-15 DIAGNOSIS — R110 Nausea: Secondary | ICD-10-CM | POA: Diagnosis not present

## 2017-07-15 DIAGNOSIS — Z7901 Long term (current) use of anticoagulants: Secondary | ICD-10-CM | POA: Diagnosis not present

## 2017-07-15 DIAGNOSIS — D649 Anemia, unspecified: Secondary | ICD-10-CM | POA: Diagnosis not present

## 2017-07-15 DIAGNOSIS — Z7952 Long term (current) use of systemic steroids: Secondary | ICD-10-CM

## 2017-07-15 LAB — BASIC METABOLIC PANEL
ANION GAP: 5 (ref 5–15)
BUN: 18 mg/dL (ref 6–20)
CHLORIDE: 112 mmol/L — AB (ref 101–111)
CO2: 23 mmol/L (ref 22–32)
Calcium: 7.1 mg/dL — ABNORMAL LOW (ref 8.9–10.3)
Creatinine, Ser: 1.52 mg/dL — ABNORMAL HIGH (ref 0.61–1.24)
GFR calc non Af Amer: 46 mL/min — ABNORMAL LOW (ref >60)
GFR, EST AFRICAN AMERICAN: 53 mL/min — AB (ref >60)
Glucose, Bld: 153 mg/dL — ABNORMAL HIGH (ref 65–99)
POTASSIUM: 3.5 mmol/L (ref 3.5–5.1)
SODIUM: 140 mmol/L (ref 135–145)

## 2017-07-15 LAB — CBC WITH DIFFERENTIAL/PLATELET
BASOS PCT: 0 %
Basophils Absolute: 0 10*3/uL (ref 0.0–0.1)
EOS ABS: 0 10*3/uL (ref 0.0–0.7)
Eosinophils Relative: 0 %
HCT: 45 % (ref 39.0–52.0)
Hemoglobin: 15.2 g/dL (ref 13.0–17.0)
LYMPHS ABS: 2.8 10*3/uL (ref 0.7–4.0)
Lymphocytes Relative: 10 %
MCH: 32.5 pg (ref 26.0–34.0)
MCHC: 33.8 g/dL (ref 30.0–36.0)
MCV: 96.4 fL (ref 78.0–100.0)
MONO ABS: 0.8 10*3/uL (ref 0.1–1.0)
Monocytes Relative: 3 %
NEUTROS ABS: 24.2 10*3/uL — AB (ref 1.7–7.7)
Neutrophils Relative %: 87 %
PLATELETS: 407 10*3/uL — AB (ref 150–400)
RBC: 4.67 MIL/uL (ref 4.22–5.81)
RDW: 15.2 % (ref 11.5–15.5)
WBC: 27.8 10*3/uL — ABNORMAL HIGH (ref 4.0–10.5)

## 2017-07-15 LAB — C DIFFICILE QUICK SCREEN W PCR REFLEX
C DIFFICILE (CDIFF) INTERP: NOT DETECTED
C DIFFICILE (CDIFF) TOXIN: NEGATIVE
C DIFFICLE (CDIFF) ANTIGEN: NEGATIVE

## 2017-07-15 LAB — COMPREHENSIVE METABOLIC PANEL
ALK PHOS: 138 U/L — AB (ref 38–126)
ALT: 44 U/L (ref 17–63)
ANION GAP: 11 (ref 5–15)
AST: 41 U/L (ref 15–41)
Albumin: 3.9 g/dL (ref 3.5–5.0)
BUN: 20 mg/dL (ref 6–20)
CALCIUM: 9 mg/dL (ref 8.9–10.3)
CHLORIDE: 104 mmol/L (ref 101–111)
CO2: 24 mmol/L (ref 22–32)
Creatinine, Ser: 1.6 mg/dL — ABNORMAL HIGH (ref 0.61–1.24)
GFR calc non Af Amer: 43 mL/min — ABNORMAL LOW (ref >60)
GFR, EST AFRICAN AMERICAN: 50 mL/min — AB (ref >60)
Glucose, Bld: 115 mg/dL — ABNORMAL HIGH (ref 65–99)
Potassium: 3.4 mmol/L — ABNORMAL LOW (ref 3.5–5.1)
SODIUM: 139 mmol/L (ref 135–145)
Total Bilirubin: 1.1 mg/dL (ref 0.3–1.2)
Total Protein: 7.1 g/dL (ref 6.5–8.1)

## 2017-07-15 LAB — URINALYSIS, ROUTINE W REFLEX MICROSCOPIC
Bilirubin Urine: NEGATIVE
Glucose, UA: NEGATIVE mg/dL
HGB URINE DIPSTICK: NEGATIVE
KETONES UR: NEGATIVE mg/dL
Leukocytes, UA: NEGATIVE
Nitrite: NEGATIVE
Protein, ur: NEGATIVE mg/dL
Specific Gravity, Urine: 1.009 (ref 1.005–1.030)
pH: 5 (ref 5.0–8.0)

## 2017-07-15 LAB — I-STAT CG4 LACTIC ACID, ED
Lactic Acid, Venous: 5.36 mmol/L (ref 0.5–1.9)
Lactic Acid, Venous: 2.31 mmol/L (ref 0.5–1.9)

## 2017-07-15 LAB — CBG MONITORING, ED
GLUCOSE-CAPILLARY: 137 mg/dL — AB (ref 65–99)
Glucose-Capillary: 79 mg/dL (ref 65–99)

## 2017-07-15 LAB — GLUCOSE, CAPILLARY
GLUCOSE-CAPILLARY: 142 mg/dL — AB (ref 65–99)
Glucose-Capillary: 97 mg/dL (ref 65–99)

## 2017-07-15 MED ORDER — VITAMIN D3 25 MCG (1000 UNIT) PO TABS: 1000.0000 [IU] | ORAL_TABLET | Freq: Every day | ORAL | Status: DC

## 2017-07-15 MED ORDER — SODIUM CHLORIDE 0.9 % IV BOLUS (SEPSIS)
500.0000 mL | Freq: Once | INTRAVENOUS | Status: AC
  Administered 2017-07-15: 500 mL via INTRAVENOUS

## 2017-07-15 MED ORDER — POTASSIUM CHLORIDE 10 MEQ/100ML IV SOLN
10.0000 meq | INTRAVENOUS | Status: AC
  Administered 2017-07-15 (×2): 10 meq via INTRAVENOUS
  Filled 2017-07-15 (×2): qty 100

## 2017-07-15 MED ORDER — SULFAMETHOXAZOLE-TRIMETHOPRIM 400-80 MG PO TABS
1.0000 | ORAL_TABLET | Freq: Every day | ORAL | Status: DC
  Administered 2017-07-15 – 2017-07-21 (×7): 1 via ORAL
  Filled 2017-07-15 (×8): qty 1

## 2017-07-15 MED ORDER — ACETAMINOPHEN 325 MG PO TABS: 650.0000 mg | ORAL_TABLET | Freq: Four times a day (QID) | ORAL | Status: DC | PRN

## 2017-07-15 MED ORDER — ALBUTEROL SULFATE (2.5 MG/3ML) 0.083% IN NEBU: 2.5000 mg | INHALATION_SOLUTION | RESPIRATORY_TRACT | Status: DC | PRN

## 2017-07-15 MED ORDER — PIPERACILLIN-TAZOBACTAM 3.375 G IVPB
3.3750 g | Freq: Three times a day (TID) | INTRAVENOUS | Status: AC
  Administered 2017-07-15 – 2017-07-20 (×17): 3.375 g via INTRAVENOUS
  Filled 2017-07-15 (×16): qty 50

## 2017-07-15 MED ORDER — SENNA 8.6 MG PO TABS
1.0000 | ORAL_TABLET | Freq: Two times a day (BID) | ORAL | Status: DC
  Administered 2017-07-20 – 2017-07-21 (×3): 8.6 mg via ORAL
  Filled 2017-07-15 (×9): qty 1

## 2017-07-15 MED ORDER — IOPAMIDOL (ISOVUE-300) INJECTION 61%: 100.0000 mL | Freq: Once | INTRAVENOUS | Status: DC | PRN

## 2017-07-15 MED ORDER — POSACONAZOLE 100 MG PO TBEC
300.0000 mg | DELAYED_RELEASE_TABLET | Freq: Every day | ORAL | Status: DC
  Administered 2017-07-15 – 2017-07-21 (×7): 300 mg via ORAL
  Filled 2017-07-15 (×7): qty 3

## 2017-07-15 MED ORDER — FOLIC ACID 400 MCG PO TABS: 800.0000 ug | ORAL_TABLET | Freq: Every day | ORAL | Status: DC

## 2017-07-15 MED ORDER — SENNOSIDES-DOCUSATE SODIUM 8.6-50 MG PO TABS: 1.0000 | ORAL_TABLET | Freq: Every evening | ORAL | Status: DC

## 2017-07-15 MED ORDER — ONDANSETRON HCL 4 MG/2ML IJ SOLN
4.0000 mg | Freq: Once | INTRAMUSCULAR | Status: AC
  Administered 2017-07-15: 4 mg via INTRAVENOUS
  Filled 2017-07-15: qty 2

## 2017-07-15 MED ORDER — OMEPRAZOLE 20 MG PO CPDR
40.0000 mg | DELAYED_RELEASE_CAPSULE | Freq: Two times a day (BID) | ORAL | Status: DC
  Administered 2017-07-15 – 2017-07-21 (×12): 40 mg via ORAL
  Filled 2017-07-15 (×13): qty 2

## 2017-07-15 MED ORDER — ATENOLOL 50 MG PO TABS
50.0000 mg | ORAL_TABLET | Freq: Every day | ORAL | Status: DC
  Administered 2017-07-16: 50 mg via ORAL
  Filled 2017-07-15 (×3): qty 1

## 2017-07-15 MED ORDER — PIPERACILLIN-TAZOBACTAM 3.375 G IVPB 30 MIN
3.3750 g | Freq: Once | INTRAVENOUS | Status: AC
Start: 2017-07-15 — End: 2017-07-15
  Administered 2017-07-15: 3.375 g via INTRAVENOUS
  Filled 2017-07-15: qty 50

## 2017-07-15 MED ORDER — FAMOTIDINE 20 MG PO TABS
20.0000 mg | ORAL_TABLET | Freq: Two times a day (BID) | ORAL | Status: AC
  Administered 2017-07-15 – 2017-07-18 (×8): 20 mg via ORAL
  Filled 2017-07-15 (×8): qty 1

## 2017-07-15 MED ORDER — LEVOFLOXACIN 500 MG PO TABS: 500.0000 mg | ORAL_TABLET | Freq: Every day | ORAL | Status: DC

## 2017-07-15 MED ORDER — TACROLIMUS 1 MG PO CAPS: 1.0000 mg | ORAL_CAPSULE | Freq: Two times a day (BID) | ORAL | Status: DC

## 2017-07-15 MED ORDER — ONDANSETRON HCL 4 MG/2ML IJ SOLN
4.0000 mg | Freq: Four times a day (QID) | INTRAMUSCULAR | Status: DC | PRN
  Administered 2017-07-16 – 2017-07-19 (×4): 4 mg via INTRAVENOUS
  Filled 2017-07-15 (×5): qty 2

## 2017-07-15 MED ORDER — ONDANSETRON HCL 4 MG/2ML IJ SOLN
INTRAMUSCULAR | Status: AC
  Administered 2017-07-15: 08:00:00
  Filled 2017-07-15: qty 2

## 2017-07-15 MED ORDER — FLUTICASONE PROPIONATE 50 MCG/ACT NA SUSP
2.0000 | Freq: Every day | NASAL | Status: DC
  Administered 2017-07-15 – 2017-07-20 (×6): 2 via NASAL
  Filled 2017-07-15: qty 16

## 2017-07-15 MED ORDER — INSULIN DETEMIR 100 UNIT/ML ~~LOC~~ SOLN
10.0000 [IU] | Freq: Every day | SUBCUTANEOUS | Status: DC
  Administered 2017-07-15 – 2017-07-17 (×3): 10 [IU] via SUBCUTANEOUS
  Filled 2017-07-15 (×3): qty 0.1

## 2017-07-15 MED ORDER — ACETAMINOPHEN 650 MG RE SUPP: 650.0000 mg | Freq: Four times a day (QID) | RECTAL | Status: DC | PRN

## 2017-07-15 MED ORDER — IOPAMIDOL (ISOVUE-300) INJECTION 61%
INTRAVENOUS | Status: AC
  Filled 2017-07-15: qty 100

## 2017-07-15 MED ORDER — TRAZODONE HCL 50 MG PO TABS: 50.0000 mg | ORAL_TABLET | Freq: Every evening | ORAL | Status: DC | PRN

## 2017-07-15 MED ORDER — FOLIC ACID 1 MG PO TABS
1.0000 mg | ORAL_TABLET | Freq: Every day | ORAL | Status: DC
Start: 2017-07-15 — End: 2017-07-21
  Administered 2017-07-15 – 2017-07-21 (×7): 1 mg via ORAL
  Filled 2017-07-15 (×7): qty 1

## 2017-07-15 MED ORDER — HYDROCORTISONE NA SUCCINATE PF 100 MG IJ SOLR
100.0000 mg | Freq: Once | INTRAMUSCULAR | Status: AC
  Administered 2017-07-15: 100 mg via INTRAVENOUS
  Filled 2017-07-15: qty 2

## 2017-07-15 MED ORDER — VANCOMYCIN HCL 10 G IV SOLR
1250.0000 mg | Freq: Every day | INTRAVENOUS | Status: DC
  Administered 2017-07-16: 1250 mg via INTRAVENOUS
  Filled 2017-07-15 (×3): qty 1250

## 2017-07-15 MED ORDER — SODIUM CHLORIDE 0.9 % IV BOLUS (SEPSIS)
30.0000 mL/kg | Freq: Once | INTRAVENOUS | Status: AC
  Administered 2017-07-15: 2787 mL via INTRAVENOUS

## 2017-07-15 MED ORDER — DIPHENHYDRAMINE HCL 50 MG/ML IJ SOLN
25.0000 mg | Freq: Four times a day (QID) | INTRAMUSCULAR | Status: AC
  Administered 2017-07-15 – 2017-07-16 (×4): 25 mg via INTRAVENOUS
  Filled 2017-07-15 (×4): qty 1

## 2017-07-15 MED ORDER — POLYETHYLENE GLYCOL 3350 17 G PO PACK: 17.0000 g | PACK | Freq: Every day | ORAL | Status: DC | PRN

## 2017-07-15 MED ORDER — POSACONAZOLE 100 MG PO TBEC: 300.0000 mg | DELAYED_RELEASE_TABLET | Freq: Every day | ORAL | Status: DC

## 2017-07-15 MED ORDER — SODIUM CHLORIDE 0.9 % IV SOLN
INTRAVENOUS | Status: DC
  Administered 2017-07-15 – 2017-07-16 (×3): via INTRAVENOUS

## 2017-07-15 MED ORDER — ONDANSETRON HCL 4 MG PO TABS
4.0000 mg | ORAL_TABLET | Freq: Four times a day (QID) | ORAL | Status: DC | PRN
  Filled 2017-07-15: qty 1

## 2017-07-15 MED ORDER — MAGNESIUM OXIDE 400 (241.3 MG) MG PO TABS
400.0000 mg | ORAL_TABLET | Freq: Every day | ORAL | Status: DC
  Administered 2017-07-15 – 2017-07-21 (×30): 400 mg via ORAL
  Filled 2017-07-15 (×30): qty 1

## 2017-07-15 MED ORDER — INSULIN ASPART 100 UNIT/ML ~~LOC~~ SOLN
0.0000 [IU] | Freq: Three times a day (TID) | SUBCUTANEOUS | Status: DC
  Administered 2017-07-15 – 2017-07-16 (×3): 1 [IU] via SUBCUTANEOUS
  Administered 2017-07-17: 2 [IU] via SUBCUTANEOUS
  Administered 2017-07-17: 1 [IU] via SUBCUTANEOUS
  Administered 2017-07-18 – 2017-07-20 (×3): 2 [IU] via SUBCUTANEOUS

## 2017-07-15 MED ORDER — ENOXAPARIN SODIUM 80 MG/0.8ML ~~LOC~~ SOLN
80.0000 mg | Freq: Two times a day (BID) | SUBCUTANEOUS | Status: DC
  Administered 2017-07-15 – 2017-07-21 (×12): 80 mg via SUBCUTANEOUS
  Filled 2017-07-15 (×12): qty 0.8

## 2017-07-15 MED ORDER — ACYCLOVIR 400 MG PO TABS
400.0000 mg | ORAL_TABLET | Freq: Two times a day (BID) | ORAL | Status: DC
  Administered 2017-07-15 – 2017-07-21 (×12): 400 mg via ORAL
  Filled 2017-07-15 (×12): qty 1

## 2017-07-15 MED ORDER — ONDANSETRON HCL 4 MG/2ML IJ SOLN
4.0000 mg | Freq: Once | INTRAMUSCULAR | Status: AC
  Administered 2017-07-15: 4 mg via INTRAVENOUS

## 2017-07-15 MED ORDER — OMEPRAZOLE MAGNESIUM 20 MG PO TBEC: 40.0000 mg | DELAYED_RELEASE_TABLET | Freq: Two times a day (BID) | ORAL | Status: DC

## 2017-07-15 MED ORDER — VITAMIN D3 25 MCG (1000 UNIT) PO TABS
1000.0000 [IU] | ORAL_TABLET | Freq: Every day | ORAL | Status: DC
  Administered 2017-07-15 – 2017-07-21 (×7): 1000 [IU] via ORAL
  Filled 2017-07-15 (×7): qty 1

## 2017-07-15 MED ORDER — SODIUM CHLORIDE 0.9 % IV SOLN: INTRAVENOUS | Status: DC

## 2017-07-15 MED ORDER — AMLODIPINE BESYLATE 10 MG PO TABS
10.0000 mg | ORAL_TABLET | Freq: Every day | ORAL | Status: DC
  Administered 2017-07-16 – 2017-07-20 (×5): 10 mg via ORAL
  Filled 2017-07-15 (×6): qty 1

## 2017-07-15 MED ORDER — SENNOSIDES-DOCUSATE SODIUM 8.6-50 MG PO TABS
1.0000 | ORAL_TABLET | Freq: Every day | ORAL | Status: DC
  Administered 2017-07-15 – 2017-07-19 (×2): 1 via ORAL
  Filled 2017-07-15 (×3): qty 1

## 2017-07-15 MED ORDER — TACROLIMUS 0.5 MG PO CAPS
0.5000 mg | ORAL_CAPSULE | Freq: Every day | ORAL | Status: DC
  Administered 2017-07-15 – 2017-07-21 (×7): 0.5 mg via ORAL
  Filled 2017-07-15 (×7): qty 1

## 2017-07-15 MED ORDER — SULFAMETHOXAZOLE-TRIMETHOPRIM 400-80 MG PO TABS: 1.0000 | ORAL_TABLET | Freq: Every day | ORAL | Status: DC

## 2017-07-15 MED ORDER — MAGNESIUM OXIDE 400 MG PO CAPS: 400.0000 mg | ORAL_CAPSULE | Freq: Every day | ORAL | Status: DC

## 2017-07-15 MED ORDER — VANCOMYCIN HCL IN DEXTROSE 1-5 GM/200ML-% IV SOLN
1000.0000 mg | Freq: Once | INTRAVENOUS | Status: AC
  Administered 2017-07-15: 1000 mg via INTRAVENOUS
  Filled 2017-07-15: qty 200

## 2017-07-15 MED ORDER — IOPAMIDOL (ISOVUE-300) INJECTION 61%
INTRAVENOUS | Status: AC
  Filled 2017-07-15: qty 30

## 2017-07-15 MED ORDER — SODIUM CHLORIDE 0.9% FLUSH: 3.0000 mL | INTRAVENOUS | Status: DC | PRN

## 2017-07-15 MED ORDER — IOPAMIDOL (ISOVUE-300) INJECTION 61%
INTRAVENOUS | Status: AC
  Administered 2017-07-15: 100 mL via INTRAVENOUS
  Filled 2017-07-15: qty 100

## 2017-07-15 MED ORDER — ENOXAPARIN SODIUM 80 MG/0.8ML ~~LOC~~ SOLN: 80.0000 mg | Freq: Two times a day (BID) | SUBCUTANEOUS | Status: DC

## 2017-07-15 MED ORDER — SODIUM CHLORIDE 0.9 % IV SOLN: 250.0000 mL | INTRAVENOUS | Status: DC | PRN

## 2017-07-15 MED ORDER — TACROLIMUS 0.5 MG PO CAPS: 0.5000 mg | ORAL_CAPSULE | Freq: Every day | ORAL | Status: DC

## 2017-07-15 MED ORDER — SODIUM CHLORIDE 0.9% FLUSH
3.0000 mL | Freq: Two times a day (BID) | INTRAVENOUS | Status: DC
  Administered 2017-07-16 – 2017-07-21 (×10): 3 mL via INTRAVENOUS

## 2017-07-15 MED ORDER — HYDROCORTISONE NA SUCCINATE PF 100 MG IJ SOLR
50.0000 mg | Freq: Three times a day (TID) | INTRAMUSCULAR | Status: DC
  Administered 2017-07-15 – 2017-07-16 (×4): 50 mg via INTRAVENOUS
  Filled 2017-07-15 (×5): qty 2

## 2017-07-15 NOTE — ED Provider Notes (Addendum)
[place] DEPT Provider Note: Georgena Spurling, MD, FACEP  CSN: [id number] MRN: [id number] ARRIVAL: 07/15/17 at [place]: [facility]   HISTORY OF PRESENT ILLNESS  07/15/17 4:08 AM Brandon [name] is a 67 y.o. male with myelodysplastic syndrome status post bone marrow transplant from a donor.  He is also on prednisone.  He was admitted about a week ago for sudden onset of chills, nausea and vomiting.  He was treated for sepsis and discharged home on Levaquin which she is still taking.  He returns with the sudden onset of severe chills, nausea and vomiting which began about 130 this morning.  Since arriving in the ED he has had diarrhea as well.  Sepsis protocol was initiated and he was found to have a lactic acid of 5.36.  An IV fluid bolus was initiated.  He has not had a fever with this, nor has he had chest pain, shortness of breath, cough or abdominal pain.  He does have weakness.  He has some chronic edema of the lower legs which he attributes to prednisone use.  Nursing staff noted him to be diaphoretic on arrival.   Past Medical History:  Diagnosis Date  . Allergy    seasonal/animals  . Chronic prostatitis 12/17/2008  . Diverticulosis 01/16/2011   History of diverticulitis   . ERECTILE DYSFUNCTION 02/07/2007  . HYPERLIPIDEMIA 02/04/2007  . HYPERTENSION 02/04/2007  . NEOPLASM, MALIGNANT, SKIN, BACK 02/09/2009  . S/P bone marrow transplant [facility])     Past Surgical History:  Procedure Laterality Date  . none      Family History  Problem Relation Age of Onset  . Breast cancer Mother   . CVA Mother        quit taking HTN meds  . Alzheimer's disease Father        late 66s. mid 6s  . Asperger's syndrome Son     Social History   Tobacco Use  . Smoking status: Never Smoker  . Smokeless tobacco: Never Used  Substance Use Topics  . Alcohol use: Yes    Alcohol/week: 1.8 oz    Types: 3 Standard drinks or equivalent per week  . Drug use: No     Prior to Admission medications   Medication Sig Start Date End Date Taking? Authorizing Provider  acyclovir (ZOVIRAX) 400 MG tablet Take 400 mg by mouth 2 (two) times daily.   Yes [provider]  amLODipine (NORVASC) 10 MG tablet Take 10 mg by mouth at bedtime.   Yes [provider]  atenolol (TENORMIN) 50 MG tablet Take 1 tablet (50 mg total) by mouth daily. Patient taking differently: Take 25 mg by mouth every evening.  12/09/15  Yes [name], MD  cetirizine (ZYRTEC) 10 MG tablet Take 10 mg by mouth at bedtime.   Yes [provider]  cholecalciferol (VITAMIN D) 1000 units tablet Take 1,000 Units by mouth daily.   Yes [provider]  enoxaparin (LOVENOX) 80 MG/0.8ML injection Inject 80 mg into the skin every 12 (twelve) hours.    Yes [provider]  fluticasone (FLONASE) 50 MCG/ACT nasal spray Place 2 sprays into both nostrils at bedtime.  05/10/16  Yes [provider]  folic acid (FOLVITE) 376 MCG tablet Take 800 mcg by mouth daily.    Yes [provider]  hydrocortisone (CORTEF) 10 MG tablet Take 10-20 mg by mouth 2 (two) times daily. Take 2 tablets in AM and 1 tablet at noon  Yes [provider]  insulin detemir (LEVEMIR) 100 UNIT/ML injection Inject 10 Units into the skin at bedtime.   Yes [provider]  insulin regular (NOVOLIN R,HUMULIN R) 100 units/mL injection Inject into the skin 3 (three) times daily before meals. Sliding scale   Yes [provider]  levofloxacin (LEVAQUIN) 500 MG tablet Take 1 tablet (500 mg total) by mouth daily for 10 days. 07/09/17 07/19/17 Yes Caren Griffins, MD  Magnesium Oxide 400 MG CAPS Take 1 capsule (400 mg total) by mouth 5 (five) times daily. 09/03/16  Yes Nicholas Lose, MD  omeprazole (PRILOSEC OTC) 20 MG tablet Take 2 tablets (40 mg total) by mouth 2 (two) times daily. take 1 tablet by mouth once daily Patient taking differently: Take 40 mg by mouth  2 (two) times daily.  09/03/16  Yes Nicholas Lose, MD  polyethylene glycol (MIRALAX / GLYCOLAX) packet Take 17 g by mouth daily as needed for mild constipation or moderate constipation.    Yes [provider]  posaconazole (NOXAFIL) 100 MG TBEC delayed-release tablet Take 300 mg by mouth daily. 06/03/16  Yes [provider]  predniSONE (DELTASONE) 1 MG tablet Take 10 mg by mouth daily.  07/03/17  Yes [provider]  senna-docusate (SENOKOT-S) 8.6-50 MG tablet Take 1 tablet by mouth Nightly.   Yes [provider]  sulfamethoxazole-trimethoprim (BACTRIM,SEPTRA) 400-80 MG tablet Take 1 tablet by mouth daily. 07/12/16  Yes [provider]  tacrolimus (PROGRAF) 0.5 MG capsule Take 0.5 mg by mouth daily after breakfast.  06/12/16  Yes [provider]    Allergies Patient has no known allergies.   REVIEW OF SYSTEMS  Negative except as noted here or in the History of Present Illness.   PHYSICAL EXAMINATION  Initial Vital Signs Blood pressure 137/79, pulse 87, temperature 97.9 F (36.6 C), temperature source Oral, resp. rate (!) 25, SpO2 97 %.  Examination General: Well-developed, well-nourished male in no acute distress; appearance consistent with age of record HENT: normocephalic; atraumatic Eyes: pupils equal, round and reactive to light; extraocular muscles intact Neck: supple Heart: regular rate and rhythm Lungs: clear to auscultation bilaterally Abdomen: soft; nondistended; nontender; no masses or hepatosplenomegaly; bowel sounds present Extremities: No deformity; full range of motion; pulses normal; +2 pitting edema of lower leg Neurologic: Awake, alert and oriented; motor function intact in all extremities and symmetric; no facial droop Skin: Warm and dry Psychiatric: Normal mood and affect   RESULTS  Summary of this visit's results, reviewed by myself:   EKG Interpretation  Date/Time:    Ventricular Rate:    PR Interval:     QRS Duration:   QT Interval:    QTC Calculation:   R Axis:     Text Interpretation:        Laboratory Studies: Results for orders placed or performed during the hospital encounter of 07/15/17 (from the past 24 hour(s))  CBC with Differential/Platelet     Status: Abnormal   Collection Time: 07/15/17  3:42 AM  Result Value Ref Range   WBC 27.8 (H) 4.0 - 10.5 K/uL   RBC 4.67 4.22 - 5.81 MIL/uL   Hemoglobin 15.2 13.0 - 17.0 g/dL   HCT 45.0 39.0 - 52.0 %   MCV 96.4 78.0 - 100.0 fL   MCH 32.5 26.0 - 34.0 pg   MCHC 33.8 30.0 - 36.0 g/dL   RDW 15.2 11.5 - 15.5 %   Platelets 407 (H) 150 - 400 K/uL   Neutrophils Relative %  87 %   Lymphocytes Relative 10 %   Monocytes Relative 3 %   Eosinophils Relative 0 %   Basophils Relative 0 %   Neutro Abs 24.2 (H) 1.7 - 7.7 K/uL   Lymphs Abs 2.8 0.7 - 4.0 K/uL   Monocytes Absolute 0.8 0.1 - 1.0 K/uL   Eosinophils Absolute 0.0 0.0 - 0.7 K/uL   Basophils Absolute 0.0 0.0 - 0.1 K/uL   WBC Morphology ATYPICAL LYMPHOCYTES   Comprehensive metabolic panel     Status: Abnormal   Collection Time: 07/15/17  3:42 AM  Result Value Ref Range   Sodium 139 135 - 145 mmol/L   Potassium 3.4 (L) 3.5 - 5.1 mmol/L   Chloride 104 101 - 111 mmol/L   CO2 24 22 - 32 mmol/L   Glucose, Bld 115 (H) 65 - 99 mg/dL   BUN 20 6 - 20 mg/dL   Creatinine, Ser 1.60 (H) 0.61 - 1.24 mg/dL   Calcium 9.0 8.9 - 10.3 mg/dL   Total Protein 7.1 6.5 - 8.1 g/dL   Albumin 3.9 3.5 - 5.0 g/dL   AST 41 15 - 41 U/L   ALT 44 17 - 63 U/L   Alkaline Phosphatase 138 (H) 38 - 126 U/L   Total Bilirubin 1.1 0.3 - 1.2 mg/dL   GFR calc non Af Amer 43 (L) >60 mL/min   GFR calc Af Amer 50 (L) >60 mL/min   Anion gap 11 5 - 15  I-Stat CG4 Lactic Acid, ED     Status: Abnormal   Collection Time: 07/15/17  4:00 AM  Result Value Ref Range   Lactic Acid, Venous 5.36 (HH) 0.5 - 1.9 mmol/L   Comment NOTIFIED PHYSICIAN   I-Stat CG4 Lactic Acid, ED     Status: Abnormal   Collection Time: 07/15/17   5:30 AM  Result Value Ref Range   Lactic Acid, Venous 2.31 (HH) 0.5 - 1.9 mmol/L   Comment NOTIFIED PHYSICIAN    Imaging Studies: Dg Chest 2 View  Result Date: 07/15/2017 CLINICAL DATA:  Chills, diaphoresis, vomiting. Sudden onset 1 hour ago. EXAM: CHEST  2 VIEW COMPARISON:  07/06/2017 FINDINGS: Shallow inspiration. Cardiac enlargement. No vascular congestion or edema. No focal consolidation. No blunting of costophrenic angles. No pneumothorax. Mediastinal contours appear intact. Degenerative changes in the spine. IMPRESSION: Cardiac enlargement.  No evidence of active pulmonary disease. Electronically Signed   By: Lucienne Capers M.D.   On: 07/15/2017 04:51    ED COURSE  Nursing notes and initial vitals signs, including pulse oximetry, reviewed.  Vitals:   07/15/17 0459 07/15/17 0500 07/15/17 0515 07/15/17 0530  BP: 127/61 125/62 117/61 110/62  Pulse: 86 84 83 82  Resp: (!) 26 (!) 26 (!) 26 (!) 24  Temp: 100.3 F (37.9 C)     TempSrc: Oral     SpO2: 94% 95% 91% 92%  Weight:      Height:       5:19 AM Zosyn and vancomycin ordered for possible sepsis with unknown source.  Dr. Tamala Julian of hospitalist service to admit.  7:15 AM Discussed with Dr. Fanny Skates, oncologist at [facility] who is familiar with the patient.  She advises that we obtain a CT of the abdomen and pelvis to evaluate for a possible occult intra-abdominal infection masked by the patient's prednisone.  PROCEDURES   CRITICAL CARE Performed by: Shanon Rosser L Total critical care time: 35 minutes Critical care time was exclusive of separately billable procedures and treating other patients. Critical care  was necessary to treat or prevent imminent or life-threatening deterioration. Critical care was time spent personally by me on the following activities: development of treatment plan with patient and/or surrogate as well as nursing, discussions with consultants, evaluation of patient's response to treatment,  examination of patient, obtaining history from patient or surrogate, ordering and performing treatments and interventions, ordering and review of laboratory studies, ordering and review of radiographic studies, pulse oximetry and re-evaluation of patient's condition.   ED DIAGNOSES     ICD-10-CM   1. SIRS (systemic inflammatory response syndrome) (HCC) R65.10   2. Nausea and vomiting in adult R11.2        Shanon Rosser, MD 07/15/17 0545    Shanon Rosser, MD 07/15/17 0545    Shanon Rosser, MD 07/15/17 1448    Shanon Rosser, MD 07/15/17 2250

## 2017-07-15 NOTE — H&P (Signed)
Patient Demographics:    Brandon [name], is a 67 y.o. male  MRN: [id number]   DOB - 08-11-1950  Admit Date - 07/15/2017  Outpatient Primary MD for the patient is [name], MD   Assessment & Plan:    Principal Problem:   Sepsis [facility]) Active Problems:   Essential hypertension   Steroid-induced hyperglycemia   MDS (myelodysplastic syndrome) ([place])   History of stem cell transplant ([place])   Deep vein thrombosis (DVT) of femoral vein of right lower extremity (HCC)   GVHD (graft versus host disease) ([place])   SIRS (systemic inflammatory response syndrome) ([place])  1)Sepsis-recurrent fevers in an immunocompromised individual, patient has leukocytosis (WBC 27.8 on 07/15/17,  was 8.3 on 07/09/17) but pt has been on steroids, initial lactic acid 5.36, repeat lactic acid after hydration 2.31,  clinically suspect sepsis however source not identifiable at this time.  ED provider discussed this case with Dr. Fanny Skates, oncologist at [facility] who is familiar with the patient, the oncologist advised CT abd/pelvis which is Pending.  Continue Vanco and Zosyn pending blood cultures, patient is chronically on steroids due to prior bone marrow transplant, stress dose steroids: solucortef 100mg  IV x1 then 50mg  Q8H IV as ordered, continue IV fluids   2)AKI-creatinine is up to 1.6 on 07/15/2017, please note that the creatinine was 1.1 on 07/09/2017, avoid nephrotoxic agents, hydrate  3)S/p Bone Marrow Transplant/Stem Cell transplant with prior H/o GVHD- pt is hemodynamically stable at this time, continue Prograf and steroids (stress dose steroids ordered as above),, continue prophylaxis with acyclovir and Bactrim.  Clinically low index of suspicion for possible adrenal crisis. Clinically doubt GVHD (graft-versus-host disease) at this  time.  Please call Dr. Fanny Skates, oncologist at [facility] once Prograf level is available to discuss further management  4)Steroid-induced hyperglycemia/secondary diabetic -continue Levemir 10 units daily, use Novolog/Humalog Sliding scale insulin with Accu-Cheks/Fingersticks as ordered    With History of - Reviewed by me  Past Medical History:  Diagnosis Date  . Allergy    seasonal/animals  . Chronic prostatitis 12/17/2008  . Diverticulosis 01/16/2011   History of diverticulitis   . ERECTILE DYSFUNCTION 02/07/2007  . HYPERLIPIDEMIA 02/04/2007  . HYPERTENSION 02/04/2007  . NEOPLASM, MALIGNANT, SKIN, BACK 02/09/2009  . S/P bone marrow transplant [facility])       Past Surgical History:  Procedure Laterality Date  . none        Chief Complaint  Patient presents with  . Chills      HPI:    Brandon [name]  is a 67 y.o. male  with PMHx given for prior of Rt LE DVT (01/2017),  S/p Bone Marrow Transplant/Stem Cell transplant in October 2017 with prior h/o GVHD for MDS who is chronically on Prograf and steroids who now Presents to the ED with c/o chills and fevers, N/v//D and diaphoresis as well as generalized weakness on and off for over a week now. Pt was admitted to the  hospital from 07/07/17 to -07/09/17 for febrile illness initially treated with Vancomycin and cefepime. However, cultures remain negative and patient was sent home with  Levaquin, pt states he is still taking levaquin and prednisone  Patient woke up around 1:30 AM on 07/15/2017 with worsening symptoms he had emesis without blood or bile, proceeded to have dry heaves, then developed loose/watery stools without blood or mucus.  No sick contacts at home,   No chest pain, no palpitations, no pleuritic symptoms, he does have chronic lower extremity edema which is unchanged.  No dysuria, no rash, no neck stiffness, no headache, no cough, no other localizing symptoms.  In ED... UA does not suggest a UTI, chest x-ray without  acute cardiopulmonary findings, white count is noted to be 27.8, but patient has been on steroids, creatinine is up to 1.6 from 1.1 about 6 days ago  In ED... Initial lactic acid was 5.36, patient received IV fluids, IV Zosyn and vancomycin, repeat lactic acid is 2.31   Labs revealed WBC 27.8, potassium 3.4, BUN 20, creatinine 1.6, lactic acid 5.36.  Sepsis protocol was initiated and the patient was given full fluid bolus with empiric antibiotics of vancomycin and Zosyn.  Repeat lactic acid trending down at 2.36.  ED provider discussed this case with Dr. Fanny Skates, oncologist at [facility] who is familiar with the patient, the oncologist advised CT abd/pelvis which is Pending.     Review of systems:    In addition to the HPI above,   A full 12 point Review of 10 Systems was done, except as stated above, all other Review of 10 Systems were negative.    Social History:  Reviewed by me    Social History   Tobacco Use  . Smoking status: Never Smoker  . Smokeless tobacco: Never Used  Substance Use Topics  . Alcohol use: Yes    Alcohol/week: 1.8 oz    Types: 3 Standard drinks or equivalent per week       Family History :  Reviewed by me    Family History  Problem Relation Age of Onset  . Breast cancer Mother   . CVA Mother        quit taking HTN meds  . Alzheimer's disease Father        late 35s. mid 33s  . Asperger's syndrome Son      Home Medications:   Prior to Admission medications   Medication Sig Start Date End Date Taking? Authorizing Provider  acyclovir (ZOVIRAX) 400 MG tablet Take 400 mg by mouth 2 (two) times daily.   Yes [provider]  amLODipine (NORVASC) 10 MG tablet Take 10 mg by mouth at bedtime.   Yes [provider]  atenolol (TENORMIN) 50 MG tablet Take 1 tablet (50 mg total) by mouth daily. Patient taking differently: Take 25 mg by mouth every evening.  12/09/15  Yes [name], MD  cetirizine (ZYRTEC) 10 MG  tablet Take 10 mg by mouth at bedtime.   Yes [provider]  cholecalciferol (VITAMIN D) 1000 units tablet Take 1,000 Units by mouth daily.   Yes [provider]  enoxaparin (LOVENOX) 80 MG/0.8ML injection Inject 80 mg into the skin every 12 (twelve) hours.    Yes [provider]  fluticasone (FLONASE) 50 MCG/ACT nasal spray Place 2 sprays into both nostrils at bedtime.  05/10/16  Yes [provider]  folic acid (FOLVITE) 096 MCG tablet Take 800 mcg by mouth daily.    Yes  [provider]  hydrocortisone (CORTEF) 10 MG tablet Take 10-20 mg by mouth 2 (two) times daily. Take 2 tablets in AM and 1 tablet at noon   Yes [provider]  insulin detemir (LEVEMIR) 100 UNIT/ML injection Inject 10 Units into the skin at bedtime.   Yes [provider]  insulin regular (NOVOLIN R,HUMULIN R) 100 units/mL injection Inject into the skin 3 (three) times daily before meals. Sliding scale   Yes [provider]  levofloxacin (LEVAQUIN) 500 MG tablet Take 1 tablet (500 mg total) by mouth daily for 10 days. 07/09/17 07/19/17 Yes Caren Griffins, MD  Magnesium Oxide 400 MG CAPS Take 1 capsule (400 mg total) by mouth 5 (five) times daily. 09/03/16  Yes Nicholas Lose, MD  omeprazole (PRILOSEC OTC) 20 MG tablet Take 2 tablets (40 mg total) by mouth 2 (two) times daily. take 1 tablet by mouth once daily Patient taking differently: Take 40 mg by mouth 2 (two) times daily.  09/03/16  Yes Nicholas Lose, MD  polyethylene glycol (MIRALAX / GLYCOLAX) packet Take 17 g by mouth daily as needed for mild constipation or moderate constipation.    Yes [provider]  posaconazole (NOXAFIL) 100 MG TBEC delayed-release tablet Take 300 mg by mouth daily. 06/03/16  Yes [provider]  predniSONE (DELTASONE) 1 MG tablet Take 10 mg by mouth daily.  07/03/17  Yes [provider]  senna-docusate (SENOKOT-S) 8.6-50 MG tablet Take 1 tablet by mouth  Nightly.   Yes [provider]  sulfamethoxazole-trimethoprim (BACTRIM,SEPTRA) 400-80 MG tablet Take 1 tablet by mouth daily. 07/12/16  Yes [provider]  tacrolimus (PROGRAF) 0.5 MG capsule Take 0.5 mg by mouth daily after breakfast.  06/12/16  Yes [provider]     Allergies:    No Known Allergies   Physical Exam:   Vitals  Blood pressure 100/68, pulse 69, temperature 100.3 F (37.9 C), temperature source Oral, resp. rate 20, height 5\' 11"  (1.803 m), weight 99.8 kg (220 lb), SpO2 96 %.  Physical Examination: General appearance - alert, tired appearing, and in no distress  Mental status - alert, oriented to person, place, and time,  Eyes - sclera anicteric Neck - supple, no JVD elevation , Chest - clear  to auscultation bilaterally, symmetrical air movement,  Heart - S1 and S2 normal,  Abdomen - soft, nontender, nondistended, no masses or organomegaly, no CVA area tenderness Neurological - screening mental status exam normal, neck supple without rigidity, cranial nerves II through XII intact, DTR's normal and symmetric Extremities - no pedal edema noted, intact peripheral pulses  Skin - warm, dry Psych-flat affect   Data Review:    CBC Recent Labs  Lab 07/09/17 0345 07/15/17 0342  WBC 8.3 27.8*  HGB 10.2* 15.2  HCT 31.0* 45.0  PLT 233 407*  MCV 97.5 96.4  MCH 32.1 32.5  MCHC 32.9 33.8  RDW 15.4 15.2  LYMPHSABS  --  2.8  MONOABS  --  0.8  EOSABS  --  0.0  BASOSABS  --  0.0   ------------------------------------------------------------------------------------------------------------------  Chemistries  Recent Labs  Lab 07/09/17 0345 07/15/17 0342  NA 139 139  K 3.8 3.4*  CL 111 104  CO2 20* 24  GLUCOSE 95 115*  BUN 13 20  CREATININE 1.11 1.60*  CALCIUM 8.3* 9.0  AST  --  41  ALT  --  44  ALKPHOS  --  138*  BILITOT  --  1.1    ------------------------------------------------------------------------------------------------------------------ estimated  creatinine clearance is 53.9 mL/min (A) (by C-G formula based on SCr of 1.6 mg/dL (H)). ------------------------------------------------------------------------------------------------------------------ No results for input(s): TSH, T4TOTAL, T3FREE, THYROIDAB in the last 72 hours.  Invalid input(s): FREET3  Coagulation profile No results for input(s): INR, PROTIME in the last 168 hours. ------------------------------------------------------------------------------------------------------------------- No results for input(s): DDIMER in the last 72 hours. ------------------------------------------------------------------------------------------------------------------ Cardiac Enzymes No results for input(s): CKMB, TROPONINI, MYOGLOBIN in the last 168 hours.  Invalid input(s): CK ------------------------------------------------------------------------------------------------------------------    Component Value Date/Time   BNP 29.6 08/12/2014 2235   ---------------------------------------------------------------------------------------------------------------  Urinalysis    Component Value Date/Time   COLORURINE YELLOW 07/15/2017 0629   APPEARANCEUR CLEAR 07/15/2017 0629   LABSPEC 1.009 07/15/2017 0629   PHURINE 5.0 07/15/2017 0629   GLUCOSEU NEGATIVE 07/15/2017 0629   GLUCOSEU NEGATIVE 12/23/2009 0742   HGBUR NEGATIVE 07/15/2017 0629   HGBUR negative 01/24/2007 0810   BILIRUBINUR NEGATIVE 07/15/2017 0629   BILIRUBINUR negative 08/10/2014 1057   BILIRUBINUR n 02/11/2013 1059   KETONESUR NEGATIVE 07/15/2017 0629   PROTEINUR NEGATIVE 07/15/2017 0629   UROBILINOGEN 0.2 08/10/2014 1057   UROBILINOGEN 0.2 12/23/2009 0742   NITRITE NEGATIVE 07/15/2017 0629   LEUKOCYTESUR NEGATIVE 07/15/2017 0629     ----------------------------------------------------------------------------------------------------------------   Imaging Results:    Dg Chest 2 View  Result Date: 07/15/2017 CLINICAL DATA:  Chills, diaphoresis, vomiting. Sudden onset 1 hour ago. EXAM: CHEST  2 VIEW COMPARISON:  07/06/2017 FINDINGS: Shallow inspiration. Cardiac enlargement. No vascular congestion or edema. No focal consolidation. No blunting of costophrenic angles. No pneumothorax. Mediastinal contours appear intact. Degenerative changes in the spine. IMPRESSION: Cardiac enlargement.  No evidence of active pulmonary disease. Electronically Signed   By: Lucienne Capers M.D.   On: 07/15/2017 04:51    Radiological Exams on Admission: Dg Chest 2 View  Result Date: 07/15/2017 CLINICAL DATA:  Chills, diaphoresis, vomiting. Sudden onset 1 hour ago. EXAM: CHEST  2 VIEW COMPARISON:  07/06/2017 FINDINGS: Shallow inspiration. Cardiac enlargement. No vascular congestion or edema. No focal consolidation. No blunting of costophrenic angles. No pneumothorax. Mediastinal contours appear intact. Degenerative changes in the spine. IMPRESSION: Cardiac enlargement.  No evidence of active pulmonary disease. Electronically Signed   By: Lucienne Capers M.D.   On: 07/15/2017 04:51    DVT Prophylaxis -SCD   AM Labs Ordered, also please review Full Orders  Family Communication: Admission, patients condition and plan of care including tests being ordered have been discussed with the patient and wife who indicate understanding and agree with the plan   Code Status - Full Code  Likely DC to  home  Condition   stable  Roxan Hockey M.D on 07/15/2017 at 8:50 AM   Between 7am to 7pm - Pager - [phone] After 7pm go to [email or web address] - password [email or web address]  [facility] - Office  [phone]  Voice Recognition Viviann Spare dictation system was used to create this note, attempts have been made to correct errors. Please contact the author with  questions and/or clarifications.

## 2017-07-15 NOTE — ED Triage Notes (Signed)
Pt presents in a diaphoretic state, actively vomiting and shivering all of which he reports a sudden onset about an hour PTA. Remarks on his oncology hx and recent hospitalization for similar symptoms, discharged on levofloxacin and prednisone and was "doing well until today."

## 2017-07-15 NOTE — Progress Notes (Signed)
Pharmacy Antibiotic Note  Brandon [name] is a 67 y.o. male with PMH MDS s/p bone marrow transplant 14 months ago, DVT, recently admitted to the hospital from 1/13-1/15 for febrile illness initially treated with Vancomycin and cefepime. However, cultures remained negative and patient sent home on 4-day course of Levaquin. Today returns with fever and tachypnea; CXR unremarkable. Pharmacy has been consulted for vancomycin and Zosyn dosing for sepsis.  Plan:  Vancomycin 1250 mg IV q24 hr (est AUC 516 based on SCr 1.6; Ke 0.044)  Measure vancomycin AUC at steady state as indicated  Zosyn 3.375 g IV given once over 30 minutes, then every 8 hrs by 4-hr infusion  Daily SCr while on Vanc + Zosyn  Height: 5\' 11"  (180.3 cm) Weight: 220 lb (99.8 kg) IBW/kg (Calculated) : 75.3  Temp (24hrs), Avg:98.6 F (37 C), Min:97.5 F (36.4 C), Max:100.3 F (37.9 C)  Recent Labs  Lab 07/09/17 0345 07/15/17 0342 07/15/17 0400 07/15/17 0530  WBC 8.3 27.8*  --   --   CREATININE 1.11 1.60*  --   --   LATICACIDVEN  --   --  5.36* 2.31*    Estimated Creatinine Clearance: 53.9 mL/min (A) (by C-G formula based on SCr of 1.6 mg/dL (H)).    No Known Allergies  Antimicrobials this admission: 1/21 vanc >>  1/21 Zosyn >>   Dose adjustments this admission: n/a  Microbiology results: 1/21 BCx: sent   Thank you for allowing pharmacy to be a part of this patient's care.  Reuel Boom, PharmD, BCPS [phone] 07/15/2017, 8:34 AM

## 2017-07-15 NOTE — ED Notes (Signed)
PT unable to provide urine sample at this time

## 2017-07-15 NOTE — ED Notes (Signed)
Trspt to floor after CT

## 2017-07-15 NOTE — ED Notes (Signed)
ED TO INPATIENT HANDOFF REPORT  Name/Age/Gender Brandon Brandon [name] 67 y.o. male  Code Status    Code Status Orders  (From admission, onward)        Start     Ordered   07/15/17 0942  Full code  Continuous     07/15/17 0941    Code Status History    Date Active Date Inactive Code Status Order ID Comments User Context   07/07/2017 00:28 07/09/2017 18:06 Full Code [id number]  Brandon Quill, DO ED   01/09/2016 04:55 01/10/2016 00:25 Full Code [id number]  Brandon Patience, MD Inpatient   08/13/2014 01:55 08/13/2014 16:32 Full Code [id number]  Brandon Gee, MD Inpatient    Advance Directive Documentation     Brandon [name] Recent Value  Type of Advance Directive  Healthcare Power of Attorney, Living will  Pre-existing out of facility DNR order (yellow form or pink Brandon [name] form)  No data  "Brandon [name]" Form in Place?  No data      Home/SNF/Other Home  Chief Complaint CHEMO CARD;emesis;chills  Level of Care/Admitting Diagnosis ED Disposition    ED Disposition Condition [facility] Area: [place] [[id number]]  Level of Care: Telemetry [5]  Admit to tele based on following criteria: Other see comments  Comments: sepsis  Diagnosis: Sepsis [facility]) [[id number]]  Admitting Physician: Morrison Old  Attending Physician: Morrison Old  Estimated length of stay: 3 - 4 days  Certification:: I certify this patient will need inpatient services for at least 2 midnights  Bed request comments: tele  PT Class (Do Not Modify): Inpatient [101]  PT Acc Code (Do Not Modify): Private [1]       Medical History Past Medical History:  Diagnosis Date  . Allergy    seasonal/animals  . Chronic prostatitis 12/17/2008  . Diverticulosis 01/16/2011   History of diverticulitis   . ERECTILE DYSFUNCTION 02/07/2007  . HYPERLIPIDEMIA 02/04/2007  . HYPERTENSION 02/04/2007  . NEOPLASM, MALIGNANT, SKIN, BACK 02/09/2009  . S/P bone marrow transplant ([place])      Allergies No Known Allergies  IV Location/Drains/Wounds Patient Lines/Drains/Airways Status   Active Line/Drains/Airways    Name:   Placement date:   Placement time:   Site:   Days:   Peripheral IV 07/15/17 Right Antecubital   07/15/17    0349    Antecubital   less than 1   Peripheral IV 07/15/17 Left Antecubital   07/15/17    0411    Antecubital   less than 1   PICC Double Lumen 12/22/15 PICC Left   12/22/15    -     571          Labs/Imaging Results for orders placed or performed during the hospital encounter of 07/15/17 (from the past 48 hour(s))  CBC with Differential/Platelet     Status: Abnormal   Collection Time: 07/15/17  3:42 AM  Result Value Ref Range   WBC 27.8 (H) 4.0 - 10.5 K/uL   RBC 4.67 4.22 - 5.81 MIL/uL   Hemoglobin 15.2 13.0 - 17.0 g/dL   HCT 45.0 39.0 - 52.0 %   MCV 96.4 78.0 - 100.0 fL   MCH 32.5 26.0 - 34.0 pg   MCHC 33.8 30.0 - 36.0 g/dL   RDW 15.2 11.5 - 15.5 %   Platelets 407 (H) 150 - 400 K/uL   Neutrophils Relative % 87 %   Lymphocytes Relative 10 %   Monocytes Relative 3 %   Eosinophils  Relative 0 %   Basophils Relative 0 %   Neutro Abs 24.2 (H) 1.7 - 7.7 K/uL   Lymphs Abs 2.8 0.7 - 4.0 K/uL   Monocytes Absolute 0.8 0.1 - 1.0 K/uL   Eosinophils Absolute 0.0 0.0 - 0.7 K/uL   Basophils Absolute 0.0 0.0 - 0.1 K/uL   WBC Morphology ATYPICAL LYMPHOCYTES   Comprehensive metabolic panel     Status: Abnormal   Collection Time: 07/15/17  3:42 AM  Result Value Ref Range   Sodium 139 135 - 145 mmol/L   Potassium 3.4 (L) 3.5 - 5.1 mmol/L   Chloride 104 101 - 111 mmol/L   CO2 24 22 - 32 mmol/L   Glucose, Bld 115 (H) 65 - 99 mg/dL   BUN 20 6 - 20 mg/dL   Creatinine, Ser 1.60 (H) 0.61 - 1.24 mg/dL   Calcium 9.0 8.9 - 10.3 mg/dL   Total Protein 7.1 6.5 - 8.1 g/dL   Albumin 3.9 3.5 - 5.0 g/dL   AST 41 15 - 41 U/L   ALT 44 17 - 63 U/L   Alkaline Phosphatase 138 (H) 38 - 126 U/L   Total Bilirubin 1.1 0.3 - 1.2 mg/dL   GFR calc non Af Amer 43 (L)  >60 mL/min   GFR calc Af Amer 50 (L) >60 mL/min    Comment: (NOTE) The eGFR has been calculated using the CKD EPI equation. This calculation has not been validated in all clinical situations. eGFR's persistently <60 mL/min signify possible Chronic Kidney Disease.    Anion gap 11 5 - 15  I-Stat CG4 Lactic Acid, ED     Status: Abnormal   Collection Time: 07/15/17  4:00 AM  Result Value Ref Range   Lactic Acid, Venous 5.36 (HH) 0.5 - 1.9 mmol/L   Comment NOTIFIED PHYSICIAN   I-Stat CG4 Lactic Acid, ED     Status: Abnormal   Collection Time: 07/15/17  5:30 AM  Result Value Ref Range   Lactic Acid, Venous 2.31 (HH) 0.5 - 1.9 mmol/L   Comment NOTIFIED PHYSICIAN   Urinalysis, Routine w reflex microscopic     Status: None   Collection Time: 07/15/17  6:29 AM  Result Value Ref Range   Color, Urine YELLOW YELLOW   APPearance CLEAR CLEAR   Specific Gravity, Urine 1.009 1.005 - 1.030   pH 5.0 5.0 - 8.0   Glucose, UA NEGATIVE NEGATIVE mg/dL   Hgb urine dipstick NEGATIVE NEGATIVE   Bilirubin Urine NEGATIVE NEGATIVE   Ketones, ur NEGATIVE NEGATIVE mg/dL   Protein, ur NEGATIVE NEGATIVE mg/dL   Nitrite NEGATIVE NEGATIVE   Leukocytes, UA NEGATIVE NEGATIVE  CBG monitoring, ED     Status: None   Collection Time: 07/15/17  9:12 AM  Result Value Ref Range   Glucose-Capillary 79 65 - 99 mg/dL  CBG monitoring, ED     Status: Abnormal   Collection Time: 07/15/17 12:20 PM  Result Value Ref Range   Glucose-Capillary 137 (H) 65 - 99 mg/dL  Basic metabolic panel     Status: Abnormal   Collection Time: 07/15/17 12:45 PM  Result Value Ref Range   Sodium 140 135 - 145 mmol/L   Potassium 3.5 3.5 - 5.1 mmol/L   Chloride 112 (H) 101 - 111 mmol/L   CO2 23 22 - 32 mmol/L   Glucose, Bld 153 (H) 65 - 99 mg/dL   BUN 18 6 - 20 mg/dL   Creatinine, Ser 1.52 (H) 0.61 - 1.24 mg/dL   Calcium  7.1 (L) 8.9 - 10.3 mg/dL   GFR calc non Af Amer 46 (L) >60 mL/min   GFR calc Af Amer 53 (L) >60 mL/min    Comment:  (NOTE) The eGFR has been calculated using the CKD EPI equation. This calculation has not been validated in all clinical situations. eGFR's persistently <60 mL/min signify possible Chronic Kidney Disease.    Anion gap 5 5 - 15   Dg Chest 2 View  Result Date: 07/15/2017 CLINICAL DATA:  Chills, diaphoresis, vomiting. Sudden onset 1 hour ago. EXAM: CHEST  2 VIEW COMPARISON:  07/06/2017 FINDINGS: Shallow inspiration. Cardiac enlargement. No vascular congestion or edema. No focal consolidation. No blunting of costophrenic angles. No pneumothorax. Mediastinal contours appear intact. Degenerative changes in the spine. IMPRESSION: Cardiac enlargement.  No evidence of active pulmonary disease. Electronically Signed   By: Lucienne Capers M.D.   On: 07/15/2017 04:51    Pending Labs Unresulted Labs (From admission, onward)   Start     Ordered   07/16/17 7048  Basic metabolic panel  Tomorrow morning,   R     07/15/17 0941   07/16/17 0500  CBC  Tomorrow morning,   R     07/15/17 0941   07/16/17 0500  Creatinine, serum  Daily,   R     07/15/17 0829   07/15/17 0958  Tacrolimus level  Once,   R     07/15/17 0957   07/15/17 0324  Blood culture (routine x 2)  BLOOD CULTURE X 2,   STAT     07/15/17 0323      Vitals/Pain Today's Vitals   07/15/17 1130 07/15/17 1230 07/15/17 1300 07/15/17 1330  BP: (!) _0 101/67  Pulse: 63 66 64 63  Resp: 20 (!) 22 19 (!) 22  Temp:      TempSrc:      SpO2: 96% 98% 96% 97%  Weight:      Height:      PainSc:        Isolation Precautions No active isolations  Medications Medications  0.9 %  sodium chloride infusion ( Intravenous Transfusing/Transfer 07/15/17 1407)  iopamidol (ISOVUE-300) 61 % injection (not administered)  atenolol (TENORMIN) tablet 50 mg (50 mg Oral Not Given 07/15/17 1248)  posaconazole (NOXAFIL) delayed-release tablet 300 mg (300 mg Oral Not Given 07/15/17 1249)  senna-docusate (Senokot-S) tablet 1 tablet (not administered)   tacrolimus (PROGRAF) capsule 0.5 mg (0.5 mg Oral Not Given 07/15/17 1247)  sulfamethoxazole-trimethoprim (BACTRIM,SEPTRA) 400-80 MG per tablet 1 tablet (1 tablet Oral Not Given 07/15/17 1249)  fluticasone (FLONASE) 50 MCG/ACT nasal spray 2 spray (not administered)  Magnesium Oxide CAPS 400 mg (400 mg Oral Not Given 07/15/17 1249)  omeprazole (PRILOSEC OTC) EC tablet 40 mg (40 mg Oral Not Given 07/15/17 1249)  enoxaparin (LOVENOX) injection 80 mg (80 mg Subcutaneous Not Given 8/89/16 9450)  folic acid (FOLVITE) tablet 800 mcg (800 mcg Oral Not Given 07/15/17 1250)  polyethylene glycol (MIRALAX / GLYCOLAX) packet 17 g (not administered)  cholecalciferol (VITAMIN D) tablet 1,000 Units (1,000 Units Oral Not Given 07/15/17 1250)  insulin detemir (LEVEMIR) injection 10 Units (not administered)  levofloxacin (LEVAQUIN) tablet 500 mg (500 mg Oral Not Given 07/15/17 1250)  acyclovir (ZOVIRAX) tablet 400 mg (400 mg Oral Not Given 07/15/17 1251)  amLODipine (NORVASC) tablet 10 mg (not administered)  sodium chloride flush (NS) 0.9 % injection 3 mL (3 mLs Intravenous Not Given 07/15/17 1251)  sodium chloride flush (NS) 0.9 % injection 3  mL (not administered)  0.9 %  sodium chloride infusion (not administered)  acetaminophen (TYLENOL) tablet 650 mg (not administered)    Or  acetaminophen (TYLENOL) suppository 650 mg (not administered)  traZODone (DESYREL) tablet 50 mg (not administered)  senna (SENOKOT) tablet 8.6 mg (8.6 mg Oral Not Given 07/15/17 1251)  polyethylene glycol (MIRALAX / GLYCOLAX) packet 17 g (not administered)  ondansetron (ZOFRAN) tablet 4 mg (not administered)    Or  ondansetron (ZOFRAN) injection 4 mg (not administered)  albuterol (PROVENTIL) (2.5 MG/3ML) 0.083% nebulizer solution 2.5 mg (not administered)  0.9 %  sodium chloride infusion ( Intravenous Not Given 07/15/17 1130)  hydrocortisone sodium succinate (SOLU-CORTEF) 100 MG injection 50 mg (not administered)  piperacillin-tazobactam  (ZOSYN) IVPB 3.375 g (0 g Intravenous Stopped 07/15/17 1319)  vancomycin (VANCOCIN) 1,250 mg in sodium chloride 0.9 % 250 mL IVPB (1,250 mg Intravenous Not Given 07/15/17 1255)  insulin aspart (novoLOG) injection 0-9 Units (0 Units Subcutaneous Not Given 07/15/17 1258)  diphenhydrAMINE (BENADRYL) injection 25 mg (25 mg Intravenous Given 07/15/17 1129)  famotidine (PEPCID) tablet 20 mg (20 mg Oral Given 07/15/17 1128)  sodium chloride 0.9 % bolus 2,787 mL (0 mL/kg  92.9 kg Intravenous Stopped 07/15/17 0634)  ondansetron (ZOFRAN) injection 4 mg (4 mg Intravenous Given 07/15/17 0442)  piperacillin-tazobactam (ZOSYN) IVPB 3.375 g (0 g Intravenous Stopped 07/15/17 0521)  vancomycin (VANCOCIN) IVPB 1000 mg/200 mL premix (0 mg Intravenous Stopped 07/15/17 0634)  potassium chloride 10 mEq in 100 mL IVPB (0 mEq Intravenous Stopped 07/15/17 0839)  ondansetron (ZOFRAN) injection 4 mg (4 mg Intravenous Given 07/15/17 0803)  ondansetron (ZOFRAN) 4 MG/2ML injection (  Given 07/15/17 0808)  hydrocortisone sodium succinate (SOLU-CORTEF) 100 MG injection 100 mg (100 mg Intravenous Given 07/15/17 0950)  sodium chloride 0.9 % bolus 500 mL (0 mLs Intravenous Stopped 07/15/17 1058)    Mobility walks

## 2017-07-15 NOTE — Plan of Care (Signed)
Discussed case with Dr. Florina Ou.   Mr. Rhoads is a 67 year old male with pmh MDS s/p bone marrow transplant 14 months ago, DVT, ; who presented with complaints of chills, nausea, vomiting, diarrhea.  Patient just been admitted to the hospital from 1/13-1/15 for febrile illness initially treated with Vancomycin and cefepime. However, cultures remain negative and patient was sent home with 4-day course of Levaquin.  Vital signs revealed temperature 100.67F, respirations 20-26, and all other vital signs relatively within normal limits.  Chest x-ray revealed cardiac enlargement without signs of acute infiltrate.  Labs revealed WBC 27.8, potassium 3.4, BUN 20, creatinine 1.6, lactic acid 5.36.  Sepsis protocol was initiated and the patient was given full fluid bolus with empiric antibiotics of vancomycin and Zosyn.  Repeat lactic acid trending down at 2.36.  TRH called to admit.  Orders placed for a telemetry bed.

## 2017-07-16 LAB — LACTIC ACID, PLASMA: Lactic Acid, Venous: 2.3 mmol/L (ref 0.5–1.9)

## 2017-07-16 LAB — CBC
HCT: 31.6 % — ABNORMAL LOW (ref 39.0–52.0)
HEMOGLOBIN: 10.3 g/dL — AB (ref 13.0–17.0)
MCH: 31.3 pg (ref 26.0–34.0)
MCHC: 32.6 g/dL (ref 30.0–36.0)
MCV: 96 fL (ref 78.0–100.0)
Platelets: 190 10*3/uL (ref 150–400)
RBC: 3.29 MIL/uL — AB (ref 4.22–5.81)
RDW: 16 % — ABNORMAL HIGH (ref 11.5–15.5)
WBC: 9.5 10*3/uL (ref 4.0–10.5)

## 2017-07-16 LAB — GLUCOSE, CAPILLARY
Glucose-Capillary: 84 mg/dL (ref 65–99)
Glucose-Capillary: 123 mg/dL — ABNORMAL HIGH (ref 65–99)
Glucose-Capillary: 144 mg/dL — ABNORMAL HIGH (ref 65–99)
Glucose-Capillary: 162 mg/dL — ABNORMAL HIGH (ref 65–99)

## 2017-07-16 LAB — BASIC METABOLIC PANEL
ANION GAP: 3 — AB (ref 5–15)
BUN: 13 mg/dL (ref 6–20)
CHLORIDE: 113 mmol/L — AB (ref 101–111)
CO2: 23 mmol/L (ref 22–32)
Calcium: 7.5 mg/dL — ABNORMAL LOW (ref 8.9–10.3)
Creatinine, Ser: 1.1 mg/dL (ref 0.61–1.24)
GFR calc non Af Amer: >60 mL/min (ref >60)
GLUCOSE: 98 mg/dL (ref 65–99)
Potassium: 3 mmol/L — ABNORMAL LOW (ref 3.5–5.1)
Sodium: 139 mmol/L (ref 135–145)

## 2017-07-16 LAB — MAGNESIUM: Magnesium: 1.7 mg/dL (ref 1.7–2.4)

## 2017-07-16 LAB — MRSA PCR SCREENING: MRSA by PCR: NEGATIVE

## 2017-07-16 LAB — TACROLIMUS LEVEL: Tacrolimus (FK506) - LabCorp: 2.7 ng/mL (ref 2.0–20.0)

## 2017-07-16 MED ORDER — HYDROCORTISONE NA SUCCINATE PF 100 MG IJ SOLR
50.0000 mg | Freq: Two times a day (BID) | INTRAMUSCULAR | Status: DC
  Administered 2017-07-17 – 2017-07-18 (×4): 50 mg via INTRAVENOUS
  Filled 2017-07-16 (×4): qty 2

## 2017-07-16 MED ORDER — VANCOMYCIN HCL IN DEXTROSE 750-5 MG/150ML-% IV SOLN
750.0000 mg | Freq: Two times a day (BID) | INTRAVENOUS | Status: DC
  Filled 2017-07-16: qty 150

## 2017-07-16 NOTE — Care Management Note (Signed)
Case Management Note  Patient Details  Name: Brandon [name] MRN: [id number] Date of Birth: [birth year]  Subjective/Objective: 67 y/o m admitted w/Sepsis. From home.                   Action/Plan:d/c plan home.   Expected Discharge Date:  (unknown)               Expected Discharge Plan:  Home/Self Care  In-House Referral:     Discharge planning Services  CM Consult  Post Acute Care Choice:    Choice offered to:     DME Arranged:    DME Agency:     HH Arranged:    HH Agency:     Status of Service:  In process, will continue to follow  If discussed at Long Length of Stay Meetings, dates discussed:    Additional Comments:  Dessa Phi, RN 07/16/2017, 11:26 AM

## 2017-07-16 NOTE — Progress Notes (Signed)
CRITICAL VALUE ALERT  Critical Value:  Lactic acid 2.3  Date & Time Notied:  07/16/2017 1645  Provider Notified: Dr. Florene Glen  Orders Received/Actions taken:

## 2017-07-16 NOTE — Progress Notes (Signed)
Pharmacy Antibiotic Note  Brandon [name] is a 67 y.o. male with PMH MDS s/p bone marrow transplant 14 months ago, DVT, recently admitted to the hospital from 1/13-1/15 for febrile illness initially treated with Vancomycin and cefepime. However, cultures remained negative and patient sent home on 4-day course of Levaquin. Today returns on 1/21 with fever and tachypnea; CXR unremarkable. Pharmacy has been consulted for vancomycin and Zosyn dosing for sepsis.  Today, 07/16/2017: - day #2 abx - afeb, wbc wnl - scr down 1.01 (crcl~78)   Plan: - with improving renal function, will change vancomycin to 750 mg IV q12h (est AUC 406- goal 400-500). Give first dose at 0400 on 1/23 - continue Zosyn 3.375 g IV q8h over 4 hrs - monitor renal function closely ___________________________________  Height: 5\' 11"  (180.3 cm) Weight: 220 lb (99.8 kg) IBW/kg (Calculated) : 75.3  Temp (24hrs), Avg:97.9 F (36.6 C), Min:97.7 F (36.5 C), Max:98.1 F (36.7 C)  Recent Labs  Lab 07/15/17 0342 07/15/17 0400 07/15/17 0530 07/15/17 1245 07/16/17 0607  WBC 27.8*  --   --   --  9.5  CREATININE 1.60*  --   --  1.52* 1.10  LATICACIDVEN  --  5.36* 2.31*  --   --     Estimated Creatinine Clearance: 78.4 mL/min (by C-G formula based on SCr of 1.1 mg/dL).    No Known Allergies  Antimicrobials this admission: PTA acyclovir, bactrim, posaconazole resumed 1/21 vanc >>  1/21 Zosyn >>   Dose adjustments this admission: 1/22: change vanc to 750 mg q12h  Microbiology results: 1/21 BCx x2:  1/21 cdiff pcr: neg FINAL   Thank you for allowing pharmacy to be a part of this patient's care.  Dia Sitter, PharmD, BCPS 07/16/2017 11:03 AM

## 2017-07-16 NOTE — Consult Note (Signed)
   [facility] CM Inpatient Consult   07/16/2017  Brandon [name] 03/29/1951 [id number]    Patient screened for potential [facility] Care Management services due to readmission.   Spoke with Brandon [name] and wife at bedside to discuss [place] Management program.  Brandon [name] denies having any transition of care, disease management, medication, or transportation needs at this time. He states he follows up very closely with his doctors. Reports he goes to [facility] every two weeks, and follows up with Dr. Arther Abbott and Dr. Yong Channel closely as well.   Discussed that Dr. Ansel Bong practice ( [place] [facility]) is listed as doing transition of care post hospital discharge as well.   Appreciative of writer's visit. Provided 24-hr nurse advice line magnet and [facility] Care Management brochure to contact if needed.   Made inpatient RNCM aware that Brandon [name] declined [place] Management services.     Marthenia Rolling, MSN-Ed, RN,BSN [facility] Liaison [phone]

## 2017-07-16 NOTE — Progress Notes (Addendum)
PROGRESS NOTE    Brandon [name]  WVP:[id number] DOB: 1951/03/24 DOA: 07/15/2017 PCP: [name], MD   Brief Narrative:    Brandon [name]  is a 67 y.o. male with PMHx given for prior of Rt LE DVT (01/2017),  S/p Bone Marrow Transplant/Stem Cell transplant in October 2017 with prior h/o GVHD for MDS who is chronically on Prograf and steroids who nowPresents to the ED with c/o chills and fevers, N/v//D and diaphoresis as well as generalized weakness on and off for over a week now. Pt was admitted to the hospital from 07/07/17 to -1/15/19for febrile illness initially treated with Vancomycinand cefepime. However,cultures remain negative and patient was sent home with  Levaquin, pt states he is still taking levaquin and prednisone  Assessment & Plan:   Principal Problem:   Sepsis ([place]) Active Problems:   Essential hypertension   Steroid-induced hyperglycemia   MDS (myelodysplastic syndrome) ([place])   History of stem cell transplant ([place])   Deep vein thrombosis (DVT) of femoral vein of right lower extremity (HCC)   GVHD (graft versus host disease) ([place])   SIRS (systemic inflammatory response syndrome) (HCC)  1)Sepsis - recurrent fevers in an immunocompromised individual, patient has leukocytosis (WBC 27.8 on 07/15/17,  was 8.3 on 07/09/17) but pt has been on steroids, initial lactic acid 5.36, repeat lactic acid after hydration 2.31.  No obvious source.  He was recently seen with a similar picture and discharged on 1/15.  Discussed today with Dr. Fanny Skates who recommended ID consult.  Discussed with Dr. Graylon Good who will see the patient tomorrow.  ID c/s appreciate recs Will trend lactate to normal (elevated on repeat today, clinically looks well, will repeat in AM, continue IVF)  CT abdomen pelvis with no acute findings, small bilateral pleural effusions Negative MRSA PCR, will d/c vanc (1/21-22) Continue zosyn for now (1/21- )   [ ]  GI path panel (this was negative last  admission) Receiving stress dose steroids as he's on chronic steroids, will wean to BID tomorrow   2) AKI-creatinine is up to 1.6 on 07/15/2017.  Baseline creatinine looks to be around 1.3.  Improved with IVF.    3)S/p Bone Marrow Transplant/Stem Cell transplant with prior H/o GVHD- pt is hemodynamically stable at this time, continue Prograf and steroids (stress dose steroids ordered as above),, continue prophylaxis with acyclovir and Bactrim.  Pt also on posaconazole  Clinically low index of suspicion for possible adrenal crisis. Clinically doubt GVHD (graft-versus-host disease) at this time.  Please call Dr. Fanny Skates, oncologist at [facility] once Prograf level is available to discuss further management (pending)  4)Steroid-induced hyperglycemia/secondary diabetic -continue Levemir 10 units daily, use Novolog/Humalog Sliding scale insulin with Accu-Cheks/Fingersticks as ordered  # HTN: continue amldopine, atenolol  # VTE: pt being treated for RLE DVT with lovenox   DVT prophylaxis: lovenox Code Status: full  Family Communication: wife at bedside Disposition Plan: pending   Consultants:   ID for 1/23  Procedures:   none  Antimicrobials:  Anti-infectives (From admission, onward)   Start     Dose/Rate Route Frequency Ordered Stop   07/17/17 0400  vancomycin (VANCOCIN) IVPB 750 mg/150 ml premix  Status:  Discontinued     750 mg 150 mL/hr over 60 Minutes Intravenous Every 12 hours 07/16/17 1115 07/16/17 1936   07/15/17 1700  posaconazole (NOXAFIL) delayed-release tablet 300 mg     300 mg Oral Daily 07/15/17 1621     07/15/17 1700  sulfamethoxazole-trimethoprim (BACTRIM,SEPTRA) 400-80 MG per tablet 1  tablet     1 tablet Oral Daily 07/15/17 1632     07/15/17 1100  piperacillin-tazobactam (ZOSYN) IVPB 3.375 g     3.375 g 12.5 mL/hr over 240 Minutes Intravenous Every 8 hours 07/15/17 0829     07/15/17 1100  vancomycin (VANCOCIN) 1,250 mg in sodium chloride 0.9 % 250 mL  IVPB  Status:  Discontinued     1,250 mg 166.7 mL/hr over 90 Minutes Intravenous Daily 07/15/17 0829 07/16/17 1115   07/15/17 1000  posaconazole (NOXAFIL) delayed-release tablet 300 mg  Status:  Discontinued     300 mg Oral Daily 07/15/17 0941 07/15/17 1621   07/15/17 1000  sulfamethoxazole-trimethoprim (BACTRIM,SEPTRA) 400-80 MG per tablet 1 tablet  Status:  Discontinued     1 tablet Oral Daily 07/15/17 0941 07/15/17 1632   07/15/17 1000  levofloxacin (LEVAQUIN) tablet 500 mg  Status:  Discontinued     500 mg Oral Daily 07/15/17 0941 07/15/17 1712   07/15/17 1000  acyclovir (ZOVIRAX) tablet 400 mg     400 mg Oral 2 times daily 07/15/17 0941     07/15/17 0445  piperacillin-tazobactam (ZOSYN) IVPB 3.375 g     3.375 g 100 mL/hr over 30 Minutes Intravenous  Once 07/15/17 0443 07/15/17 0521   07/15/17 0445  vancomycin (VANCOCIN) IVPB 1000 mg/200 mL premix     1,000 mg 200 mL/hr over 60 Minutes Intravenous  Once 07/15/17 0443 07/15/17 0634     Subjective: Feeling better after IVF. Started with N/V chills.  No fever noted at home this time. Occurred suddenly Monday night. Some diarrhea, 3 x since Midnight.   Objective: Vitals:   07/16/17 0523 07/16/17 0800 07/16/17 1020 07/16/17 1300  BP: 121/69 118/69 124/69 127/61  Pulse: 64 (!) 58  69  Resp: 18 16    Temp: 97.9 F (36.6 C) 97.7 F (36.5 C)    TempSrc: Oral Oral    SpO2: 97% 97%  96%  Weight:      Height:        Intake/Output Summary (Last 24 hours) at 07/16/2017 1504 Last data filed at 07/16/2017 1500 Gross per 24 hour  Intake 6975.87 ml  Output 0 ml  Net 6975.87 ml   Filed Weights   07/15/17 0413  Weight: 99.8 kg (220 lb)    Examination:  General exam: Appears calm and comfortable  Respiratory system: Clear to auscultation. Respiratory effort normal. Cardiovascular system: S1 & S2 heard, RRR. No JVD, murmurs, rubs, gallops or clicks. No pedal edema. Gastrointestinal system: Abdomen is protuberant, soft and  nontender. No organomegaly or masses felt. Normal bowel sounds heard. Central nervous system: Alert and oriented. No focal neurological deficits. Extremities: Symmetric 5 x 5 power. Skin: No rashes, lesions or ulcers Psychiatry: Judgement and insight appear normal. Mood & affect appropriate.   Data Reviewed: I have personally reviewed following labs and imaging studies  CBC: Recent Labs  Lab 07/15/17 0342 07/16/17 0607  WBC 27.8* 9.5  NEUTROABS 24.2*  --   HGB 15.2 10.3*  HCT 45.0 31.6*  MCV 96.4 96.0  PLT 407* 295   Basic Metabolic Panel: Recent Labs  Lab 07/15/17 0342 07/15/17 1245 07/16/17 0607  NA 139 140 139  K 3.4* 3.5 3.0*  CL 104 112* 113*  CO2 24 23 23   GLUCOSE 115* 153* 98  BUN 20 18 13   CREATININE 1.60* 1.52* 1.10  CALCIUM 9.0 7.1* 7.5*  MG  --   --  1.7   GFR: Estimated Creatinine Clearance: 78.4 mL/min (  by C-G formula based on SCr of 1.1 mg/dL). Liver Function Tests: Recent Labs  Lab 07/15/17 0342  AST 41  ALT 44  ALKPHOS 138*  BILITOT 1.1  PROT 7.1  ALBUMIN 3.9   No results for input(s): LIPASE, AMYLASE in the last 168 hours. No results for input(s): AMMONIA in the last 168 hours. Coagulation Profile: No results for input(s): INR, PROTIME in the last 168 hours. Cardiac Enzymes: No results for input(s): CKTOTAL, CKMB, CKMBINDEX, TROPONINI in the last 168 hours. BNP (last 3 results) No results for input(s): PROBNP in the last 8760 hours. HbA1C: No results for input(s): HGBA1C in the last 72 hours. CBG: Recent Labs  Lab 07/15/17 1220 07/15/17 1655 07/15/17 2103 07/16/17 0725 07/16/17 1148  GLUCAP 137* 142* 97 84 123*   Lipid Profile: No results for input(s): CHOL, HDL, LDLCALC, TRIG, CHOLHDL, LDLDIRECT in the last 72 hours. Thyroid Function Tests: No results for input(s): TSH, T4TOTAL, FREET4, T3FREE, THYROIDAB in the last 72 hours. Anemia Panel: No results for input(s): VITAMINB12, FOLATE, FERRITIN, TIBC, IRON, RETICCTPCT in the  last 72 hours. Sepsis Labs: Recent Labs  Lab 07/15/17 0400 07/15/17 0530  LATICACIDVEN 5.36* 2.31*    Recent Results (from the past 240 hour(s))  Blood Culture (routine x 2)     Status: None   Collection Time: 07/06/17  8:43 PM  Result Value Ref Range Status   Specimen Description BLOOD LEFT ANTECUBITAL  Final   Special Requests   Final    BOTTLES DRAWN AEROBIC AND ANAEROBIC Blood Culture adequate volume   Culture   Final    NO GROWTH 5 DAYS Performed at [facility], [address], [place], [place] [address]    Report Status 07/12/2017 FINAL  Final  Urine culture     Status: Abnormal   Collection Time: 07/06/17 11:05 PM  Result Value Ref Range Status   Specimen Description URINE, RANDOM  Final   Special Requests NONE  Final   Culture (A)  Final    <10,000 COLONIES/mL INSIGNIFICANT GROWTH Performed at [facility], [place] [address], [place], [place] [address]    Report Status 07/08/2017 FINAL  Final  Blood Culture (routine x 2)     Status: None   Collection Time: 07/07/17  4:13 AM  Result Value Ref Range Status   Specimen Description BLOOD LEFT HAND  Final   Special Requests IN PEDIATRIC BOTTLE Blood Culture adequate volume  Final   Culture   Final    NO GROWTH 5 DAYS Performed at [facility], [place] [address], [place], [place] [address]    Report Status 07/12/2017 FINAL  Final  Gastrointestinal Panel by PCR , Stool     Status: None   Collection Time: 07/07/17  9:23 AM  Result Value Ref Range Status   Campylobacter species NOT DETECTED NOT DETECTED Final   Plesimonas shigelloides NOT DETECTED NOT DETECTED Final   Salmonella species NOT DETECTED NOT DETECTED Final   Yersinia enterocolitica NOT DETECTED NOT DETECTED Final   Vibrio species NOT DETECTED NOT DETECTED Final   Vibrio cholerae NOT DETECTED NOT DETECTED Final   Enteroaggregative E coli (EAEC) NOT DETECTED NOT DETECTED Final   Enteropathogenic E coli (EPEC) NOT DETECTED NOT DETECTED Final    Enterotoxigenic E coli (ETEC) NOT DETECTED NOT DETECTED Final   Shiga like toxin producing E coli (STEC) NOT DETECTED NOT DETECTED Final   Shigella/Enteroinvasive E coli (EIEC) NOT DETECTED NOT DETECTED Final   Cryptosporidium NOT  DETECTED NOT DETECTED Final   Cyclospora cayetanensis NOT DETECTED NOT DETECTED Final   Entamoeba histolytica NOT DETECTED NOT DETECTED Final   Giardia lamblia NOT DETECTED NOT DETECTED Final   Adenovirus F40/41 NOT DETECTED NOT DETECTED Final   Astrovirus NOT DETECTED NOT DETECTED Final   Norovirus GI/GII NOT DETECTED NOT DETECTED Final   Rotavirus A NOT DETECTED NOT DETECTED Final   Sapovirus (I, II, IV, and V) NOT DETECTED NOT DETECTED Final    Comment: Performed at [facility], [address] [place], [place] [address]  C difficile quick scan w PCR reflex     Status: None   Collection Time: 07/07/17  9:23 AM  Result Value Ref Range Status   C Diff antigen NEGATIVE NEGATIVE Final   C Diff toxin NEGATIVE NEGATIVE Final   C Diff interpretation No C. difficile detected.  Final  Blood culture (routine x 2)     Status: None (Preliminary result)   Collection Time: 07/15/17  3:42 AM  Result Value Ref Range Status   Specimen Description BLOOD LEFT ANTECUBITAL  Final   Special Requests   Final    BOTTLES DRAWN AEROBIC AND ANAEROBIC Blood Culture adequate volume   Culture   Final    NO GROWTH 1 DAY Performed at [facility], [place] [address], [place], [place] [address]    Report Status PENDING  Incomplete  Blood culture (routine x 2)     Status: None (Preliminary result)   Collection Time: 07/15/17  3:42 AM  Result Value Ref Range Status   Specimen Description BLOOD RIGHT ANTECUBITAL  Final   Special Requests   Final    BOTTLES DRAWN AEROBIC AND ANAEROBIC Blood Culture adequate volume   Culture   Final    NO GROWTH 1 DAY Performed at [facility], [place] [address], [place],  [address]    Report Status PENDING  Incomplete  C  difficile quick scan w PCR reflex     Status: None   Collection Time: 07/15/17  6:48 PM  Result Value Ref Range Status   C Diff antigen NEGATIVE NEGATIVE Final   C Diff toxin NEGATIVE NEGATIVE Final   C Diff interpretation No C. difficile detected.  Final         Radiology Studies: Dg Chest 2 View  Result Date: 07/15/2017 CLINICAL DATA:  Chills, diaphoresis, vomiting. Sudden onset 1 hour ago. EXAM: CHEST  2 VIEW COMPARISON:  07/06/2017 FINDINGS: Shallow inspiration. Cardiac enlargement. No vascular congestion or edema. No focal consolidation. No blunting of costophrenic angles. No pneumothorax. Mediastinal contours appear intact. Degenerative changes in the spine. IMPRESSION: Cardiac enlargement.  No evidence of active pulmonary disease. Electronically Signed   By: Lucienne Capers M.D.   On: 07/15/2017 04:51   Ct Abdomen Pelvis W Contrast  Result Date: 07/15/2017 CLINICAL DATA:  Abdominal pain, fever.  Abscess suspected. EXAM: CT ABDOMEN AND PELVIS WITH CONTRAST TECHNIQUE: Multidetector CT imaging of the abdomen and pelvis was performed using the standard protocol following bolus administration of intravenous contrast. CONTRAST:  189mL ISOVUE-300 IOPAMIDOL (ISOVUE-300) INJECTION 61% COMPARISON:  None FINDINGS: Lower chest: Small bilateral pleural effusions with overlying areas of atelectasis noted. Hepatobiliary: No suspicious liver abnormality. The gallbladder appears normal. No biliary dilatation. Pancreas: Unremarkable. No pancreatic ductal dilatation or surrounding inflammatory changes. Spleen: Normal in size without focal abnormality. Adrenals/Urinary Tract: The adrenal glands are normal. Unremarkable appearance of both kidneys. No mass or hydronephrosis identified. The urinary bladder appears normal. Stomach/Bowel:  Stomach normal. No pathologic dilatation of the large or small bowel loops. The cecum is located within the right upper quadrant of the abdomen. Nonvisualization of the appendix.  Enteric contrast material is identified within the colon up to the level of the rectum. Scattered colonic diverticula noted without acute inflammation. Vascular/Lymphatic: Normal appearance of the abdominal aorta. No enlarged retroperitoneal or mesenteric adenopathy. No enlarged pelvic or inguinal lymph nodes. Reproductive: The prostate gland appears normal. Other: There is no free fluid or fluid collections within the abdomen or pelvis. Musculoskeletal: Degenerative disc disease noted within the lower thoracic and lumbar spine. No aggressive lytic or sclerotic bone lesions identified. First degree anterolisthesis of L4 on L5 noted. IMPRESSION: 1. No acute findings within the abdomen or pelvis. No evidence for abscess. 2. Small bilateral pleural effusions and lower lobe subsegmental atelectasis. Electronically Signed   By: Kerby Moors M.D.   On: 07/15/2017 14:52        Scheduled Meds: . acyclovir  400 mg Oral BID  . amLODipine  10 mg Oral QHS  . atenolol  50 mg Oral Daily  . cholecalciferol  1,000 Units Oral Daily  . enoxaparin  80 mg Subcutaneous Q12H  . famotidine  20 mg Oral BID  . fluticasone  2 spray Each Nare QHS  . folic acid  1 mg Oral Daily  . hydrocortisone sod succinate (SOLU-CORTEF) inj  50 mg Intravenous Q8H  . insulin aspart  0-9 Units Subcutaneous TID WC  . insulin detemir  10 Units Subcutaneous QHS  . magnesium oxide  400 mg Oral 5 X Daily  . omeprazole  40 mg Oral BID AC  . posaconazole  300 mg Oral Daily  . senna  1 tablet Oral BID  . senna-docusate  1 tablet Oral QHS  . sodium chloride flush  3 mL Intravenous Q12H  . sulfamethoxazole-trimethoprim  1 tablet Oral Daily  . tacrolimus  0.5 mg Oral QPC breakfast   Continuous Infusions: . sodium chloride 150 mL/hr at 07/15/17 2113  . sodium chloride    . sodium chloride    . piperacillin-tazobactam (ZOSYN)  IV Stopped (07/16/17 1403)  . [START ON 07/17/2017] vancomycin       LOS: 1 day    Time spent: over 30  min    Fayrene Helper, MD [facility] Pager [phone]  If 7PM-7AM, please contact night-coverage [email or web address] Password [facility] 07/16/2017, 3:04 PM

## 2017-07-17 DIAGNOSIS — Z9484 Stem cells transplant status: Secondary | ICD-10-CM

## 2017-07-17 DIAGNOSIS — I10 Essential (primary) hypertension: Secondary | ICD-10-CM

## 2017-07-17 DIAGNOSIS — R112 Nausea with vomiting, unspecified: Secondary | ICD-10-CM

## 2017-07-17 DIAGNOSIS — R6510 Systemic inflammatory response syndrome (SIRS) of non-infectious origin without acute organ dysfunction: Secondary | ICD-10-CM

## 2017-07-17 DIAGNOSIS — R739 Hyperglycemia, unspecified: Secondary | ICD-10-CM

## 2017-07-17 DIAGNOSIS — T380X5A Adverse effect of glucocorticoids and synthetic analogues, initial encounter: Secondary | ICD-10-CM

## 2017-07-17 LAB — HEPATIC FUNCTION PANEL
ALK PHOS: 72 U/L (ref 38–126)
ALT: 31 U/L (ref 17–63)
AST: 27 U/L (ref 15–41)
Albumin: 2.5 g/dL — ABNORMAL LOW (ref 3.5–5.0)
BILIRUBIN INDIRECT: 0.2 mg/dL — AB (ref 0.3–0.9)
Bilirubin, Direct: 0.1 mg/dL (ref 0.1–0.5)
TOTAL PROTEIN: 4.7 g/dL — AB (ref 6.5–8.1)
Total Bilirubin: 0.3 mg/dL (ref 0.3–1.2)

## 2017-07-17 LAB — GASTROINTESTINAL PANEL BY PCR, STOOL (REPLACES STOOL CULTURE)

## 2017-07-17 LAB — CBC
HCT: 30.8 % — ABNORMAL LOW (ref 39.0–52.0)
Hemoglobin: 10.3 g/dL — ABNORMAL LOW (ref 13.0–17.0)
MCH: 32.1 pg (ref 26.0–34.0)
MCHC: 33.4 g/dL (ref 30.0–36.0)
MCV: 96 fL (ref 78.0–100.0)
PLATELETS: 180 10*3/uL (ref 150–400)
RBC: 3.21 MIL/uL — ABNORMAL LOW (ref 4.22–5.81)
RDW: 15.7 % — AB (ref 11.5–15.5)
WBC: 8.9 10*3/uL (ref 4.0–10.5)

## 2017-07-17 LAB — BASIC METABOLIC PANEL
Anion gap: 6 (ref 5–15)
BUN: 8 mg/dL (ref 6–20)
CALCIUM: 7.9 mg/dL — AB (ref 8.9–10.3)
CO2: 25 mmol/L (ref 22–32)
CREATININE: 0.95 mg/dL (ref 0.61–1.24)
Chloride: 109 mmol/L (ref 101–111)
Glucose, Bld: 102 mg/dL — ABNORMAL HIGH (ref 65–99)
Potassium: 2.7 mmol/L — CL (ref 3.5–5.1)
SODIUM: 140 mmol/L (ref 135–145)

## 2017-07-17 LAB — MAGNESIUM: Magnesium: 1.6 mg/dL — ABNORMAL LOW (ref 1.7–2.4)

## 2017-07-17 LAB — GLUCOSE, CAPILLARY
GLUCOSE-CAPILLARY: 71 mg/dL (ref 65–99)
GLUCOSE-CAPILLARY: 123 mg/dL — AB (ref 65–99)
Glucose-Capillary: 123 mg/dL — ABNORMAL HIGH (ref 65–99)

## 2017-07-17 LAB — LACTIC ACID, PLASMA: Lactic Acid, Venous: 1.1 mmol/L (ref 0.5–1.9)

## 2017-07-17 LAB — POTASSIUM: POTASSIUM: 3.1 mmol/L — AB (ref 3.5–5.1)

## 2017-07-17 MED ORDER — POTASSIUM CHLORIDE CRYS ER 20 MEQ PO TBCR
30.0000 meq | EXTENDED_RELEASE_TABLET | Freq: Once | ORAL | Status: AC
  Administered 2017-07-17: 30 meq via ORAL
  Filled 2017-07-17: qty 1

## 2017-07-17 MED ORDER — LORATADINE 10 MG PO TABS
10.0000 mg | ORAL_TABLET | Freq: Every day | ORAL | Status: DC | PRN
  Administered 2017-07-17 – 2017-07-20 (×5): 10 mg via ORAL
  Filled 2017-07-17 (×5): qty 1

## 2017-07-17 MED ORDER — POTASSIUM CHLORIDE CRYS ER 20 MEQ PO TBCR
30.0000 meq | EXTENDED_RELEASE_TABLET | ORAL | Status: AC
  Administered 2017-07-17 (×2): 30 meq via ORAL
  Filled 2017-07-17 (×2): qty 1

## 2017-07-17 MED ORDER — MAGNESIUM SULFATE 2 GM/50ML IV SOLN
2.0000 g | Freq: Once | INTRAVENOUS | Status: AC
  Administered 2017-07-17: 2 g via INTRAVENOUS
  Filled 2017-07-17: qty 50

## 2017-07-17 NOTE — Progress Notes (Signed)
CRITICAL VALUE STICKER  CRITICAL VALUE: Potassium 2.7   RECEIVER (on-site recipient of call): Baxter Kail RN  DATE & TIME NOTIFIED: 07/17/17 [phone]  MESSENGER (representative from lab):Ace  MD NOTIFIED: Baltazar Najjar   TIME OF NOTIFICATION: 0629  RESPONSE: Waiting for orders

## 2017-07-17 NOTE — Progress Notes (Signed)
PROGRESS NOTE  Brandon [name] ZOX:[id number] DOB: 27-Oct-1950 DOA: 07/15/2017 PCP: [name], MD  HPI/Recap of past 24 hours:  Brandon [name] is a 67 y.o. year old male with medical history significant priorof Rt LE DVT(01/2017), S/p Bone Marrow Transplant/Stem Cell transplantin October 2017with prior h/oGVHDfor MDS who ischronically on Prograf and steroids  who presented on 07/15/2017 with chills and subjective fevers with weakness after recent discharge for similar symptoms on 1/13-1/15/19 and was found to meet SIRS criteria with concern for either infection or adrenal insufficency.    This morning, reports no abdominal pain, no fevers or chills, feels better though worried he has come back to the hospital so soon.   Assessment/Plan: Principal Problem:   Sepsis ([place]) Active Problems:   Essential hypertension   Steroid-induced hyperglycemia   MDS (myelodysplastic syndrome) (HCC)   History of stem cell transplant ([place])   Deep vein thrombosis (DVT) of femoral vein of right lower extremity (HCC)   GVHD (graft versus host disease) (HCC)   SIRS (systemic inflammatory response syndrome) (HCC)    Sepsis (tachypnea, lactic acidosis, leukocytosis) in immunocompromised patient, resolved.  Unclear etiology though currently on empiric Zosyn therapy for presumed GI source.  CT abdomen showed no acute findings aside from small bilateral pleural effusions.  Patient symptomatology consistent with some parts of adrenal insufficiency especially considering changes in his prednisone regimen after recent hospitalization.  Awaiting ID recommendations.  Patient remains afebrile, with stablized leukocytosis and resolved lactic acidosis no localizing signs or symptoms of infection.  Follow blood cultures. Likely adrenal insufficiency in setting of viral gastroenteritis worsened by quick taper of prednisone therapy as outpatient.  Worsening nausea, generalized malaise, concern for likely adrenal  insufficiency, stable.  Patient chronically on hydrocortisone and prednisone.  After recent hospitalization prednisone taper may have been to [place] (prednisone 30 mg for 3 days and returning to home dose of 1 mg) and caused current flare.  Discussed case with patient's oncologist who agrees this may be related to adrenal insufficiency and not GVHD Continue stress dose steroids(hydrocortisone 50 mg BID) and careful transition to slow prednisone taper once infectious etiology ruled out. Continue pepcid for prophylaxis  AKI,prerenal, resolved.  Returned to baseline with fluid resuscitation.   S/p Bone Marrow Transplant/Stem Celltransplant withprior H/oGVHD M his level returned at 2.7.  Discussed with patient's oncologist Dr. Fanny Skates on 1/23 who recommended to continue current tacrolimus dose of 0.5mg  qd, acyclovir, and bactrim  Steroid-induced hyperglycemia, stable.  Continue home Levemir 10 units daily, sliding scale insulin, close monitoring of blood glucose.  Chronic Problems HTN- home amlodipine, atenolol   Code Status: Full Code  Family Communication: No family at bedside  Disposition Plan: determine antibiotic and steroid regimen for infection/ likely exacerbated adrenal insufficiency   Consultants:  ID   Procedures:  None   Antimicrobials:  Acyclovir 1/21-23  Zosyn 1/20>>>  Bactrim 1/21>>>  Vancomycin 1/22   Cultures:  Blood cultures 07/15/17: NGTD  DVT prophylaxis: Lovenox   Objective: Vitals:   07/16/17 2157 07/17/17 0516 07/17/17 1018 07/17/17 1544  BP: 132/81 (!) 132/91  136/76  Pulse: 80 (!) 50 (!) 57 (!) 59  Resp: 17 19  18   Temp: 98.6 F (37 C) 97.6 F (36.4 C)  98.1 F (36.7 C)  TempSrc: Oral Oral  Oral  SpO2: 97% 96%  96%  Weight:      Height:        Intake/Output Summary (Last 24 hours) at 07/17/2017 1728 Last data filed at  07/17/2017 1500 Gross per 24 hour  Intake 2500.5 ml  Output -  Net 2500.5 ml   Filed Weights   07/15/17  0413  Weight: 99.8 kg (220 lb)    Exam:  General: Lying in bed, in no apparent distres Eyes: EOMI, anicteric ENT: Oral Mucosa clear and moist Neck: normal, no appreciable JVD Cardiovascular: regular rate and rhythm, no murmurs, rubs or gallops, Peripheral Pulses Present, no edema Respiratory: Normal respiratory effort, lungs clear to auscultation bilaterally, no rales, wheezes or rhonchi Abdomen: soft, non-distended, non-tender, normal bowel sounds, no guarding or rebound tenderness Skin: No Rash Musculoskeletal:No clubbing / cyanosis. No joint deformity upper and lower extremities. Good ROM, no contractures. Normal muscle tone Neurologic: Grossly no focal neuro deficit.Mental status AAOx3, speech normal, Psychiatric:Appropriate affect, and mood  Data Reviewed: CBC: Recent Labs  Lab 07/15/17 0342 07/16/17 0607 07/17/17 0529  WBC 27.8* 9.5 8.9  NEUTROABS 24.2*  --   --   HGB 15.2 10.3* 10.3*  HCT 45.0 31.6* 30.8*  MCV 96.4 96.0 96.0  PLT 407* 190 315   Basic Metabolic Panel: Recent Labs  Lab 07/15/17 0342 07/15/17 1245 07/16/17 0607 07/17/17 0529  NA 139 140 139 140  K 3.4* 3.5 3.0* 2.7*  CL 104 112* 113* 109  CO2 24 23 23 25   GLUCOSE 115* 153* 98 102*  BUN 20 18 13 8   CREATININE 1.60* 1.52* 1.10 0.95  CALCIUM 9.0 7.1* 7.5* 7.9*  MG  --   --  1.7  --    GFR: Estimated Creatinine Clearance: 90.8 mL/min (by C-G formula based on SCr of 0.95 mg/dL). Liver Function Tests: Recent Labs  Lab 07/15/17 0342 07/17/17 0529  AST 41 27  ALT 44 31  ALKPHOS 138* 72  BILITOT 1.1 0.3  PROT 7.1 4.7*  ALBUMIN 3.9 2.5*   No results for input(s): LIPASE, AMYLASE in the last 168 hours. No results for input(s): AMMONIA in the last 168 hours. Coagulation Profile: No results for input(s): INR, PROTIME in the last 168 hours. Cardiac Enzymes: No results for input(s): CKTOTAL, CKMB, CKMBINDEX, TROPONINI in the last 168 hours. BNP (last 3 results) No results for input(s):  PROBNP in the last 8760 hours. HbA1C: No results for input(s): HGBA1C in the last 72 hours. CBG: Recent Labs  Lab 07/16/17 1148 07/16/17 1638 07/16/17 2155 07/17/17 0744 07/17/17 1150  GLUCAP 123* 144* 162* 71 123*   Lipid Profile: No results for input(s): CHOL, HDL, LDLCALC, TRIG, CHOLHDL, LDLDIRECT in the last 72 hours. Thyroid Function Tests: No results for input(s): TSH, T4TOTAL, FREET4, T3FREE, THYROIDAB in the last 72 hours. Anemia Panel: No results for input(s): VITAMINB12, FOLATE, FERRITIN, TIBC, IRON, RETICCTPCT in the last 72 hours. Urine analysis:    Component Value Date/Time   COLORURINE YELLOW 07/15/2017 0629   APPEARANCEUR CLEAR 07/15/2017 0629   LABSPEC 1.009 07/15/2017 0629   PHURINE 5.0 07/15/2017 0629   GLUCOSEU NEGATIVE 07/15/2017 0629   GLUCOSEU NEGATIVE 12/23/2009 0742   HGBUR NEGATIVE 07/15/2017 0629   HGBUR negative 01/24/2007 0810   BILIRUBINUR NEGATIVE 07/15/2017 0629   BILIRUBINUR negative 08/10/2014 1057   BILIRUBINUR n 02/11/2013 1059   KETONESUR NEGATIVE 07/15/2017 0629   PROTEINUR NEGATIVE 07/15/2017 0629   UROBILINOGEN 0.2 08/10/2014 1057   UROBILINOGEN 0.2 12/23/2009 0742   NITRITE NEGATIVE 07/15/2017 0629   LEUKOCYTESUR NEGATIVE 07/15/2017 0629   Sepsis Labs: @LABRCNTIP (procalcitonin:4,lacticidven:4)  ) Recent Results (from the past 240 hour(s))  Blood culture (routine x 2)     Status: None (  Preliminary result)   Collection Time: 07/15/17  3:42 AM  Result Value Ref Range Status   Specimen Description BLOOD LEFT ANTECUBITAL  Final   Special Requests   Final    BOTTLES DRAWN AEROBIC AND ANAEROBIC Blood Culture adequate volume   Culture   Final    NO GROWTH 2 DAYS Performed at [facility], [address] [place], [place] [address]    Report Status PENDING  Incomplete  Blood culture (routine x 2)     Status: None (Preliminary result)   Collection Time: 07/15/17  3:42 AM  Result Value Ref Range Status   Specimen  Description BLOOD RIGHT ANTECUBITAL  Final   Special Requests   Final    BOTTLES DRAWN AEROBIC AND ANAEROBIC Blood Culture adequate volume   Culture   Final    NO GROWTH 2 DAYS Performed at [facility], [address] [place], [place] [address]    Report Status PENDING  Incomplete  C difficile quick scan w PCR reflex     Status: None   Collection Time: 07/15/17  6:48 PM  Result Value Ref Range Status   C Diff antigen NEGATIVE NEGATIVE Final   C Diff toxin NEGATIVE NEGATIVE Final   C Diff interpretation No C. difficile detected.  Final  Gastrointestinal Panel by PCR , Stool     Status: None   Collection Time: 07/16/17  3:57 PM  Result Value Ref Range Status   Campylobacter species NOT DETECTED NOT DETECTED Final   Plesimonas shigelloides NOT DETECTED NOT DETECTED Final   Salmonella species NOT DETECTED NOT DETECTED Final   Yersinia enterocolitica NOT DETECTED NOT DETECTED Final   Vibrio species NOT DETECTED NOT DETECTED Final   Vibrio cholerae NOT DETECTED NOT DETECTED Final   Enteroaggregative E coli (EAEC) NOT DETECTED NOT DETECTED Final   Enteropathogenic E coli (EPEC) NOT DETECTED NOT DETECTED Final   Enterotoxigenic E coli (ETEC) NOT DETECTED NOT DETECTED Final   Shiga like toxin producing E coli (STEC) NOT DETECTED NOT DETECTED Final   Shigella/Enteroinvasive E coli (EIEC) NOT DETECTED NOT DETECTED Final   Cryptosporidium NOT DETECTED NOT DETECTED Final   Cyclospora cayetanensis NOT DETECTED NOT DETECTED Final   Entamoeba histolytica NOT DETECTED NOT DETECTED Final   Giardia lamblia NOT DETECTED NOT DETECTED Final   Adenovirus F40/41 NOT DETECTED NOT DETECTED Final   Astrovirus NOT DETECTED NOT DETECTED Final   Norovirus GI/GII NOT DETECTED NOT DETECTED Final   Rotavirus A NOT DETECTED NOT DETECTED Final   Sapovirus (I, II, IV, and V) NOT DETECTED NOT DETECTED Final    Comment: Performed at [facility], [address] [place], [place] [address]  MRSA PCR  Screening     Status: None   Collection Time: 07/16/17  3:57 PM  Result Value Ref Range Status   MRSA by PCR NEGATIVE NEGATIVE Final    Comment:        The GeneXpert MRSA Assay (FDA approved for NASAL specimens only), is one component of a comprehensive MRSA colonization surveillance program. It is not intended to diagnose MRSA infection nor to guide or monitor treatment for MRSA infections.       Studies: No results found.  Scheduled Meds: . acyclovir  400 mg Oral BID  . amLODipine  10 mg Oral QHS  . atenolol  50 mg Oral Daily  . cholecalciferol  1,000 Units Oral Daily  . enoxaparin  80 mg Subcutaneous Q12H  . famotidine  20  mg Oral BID  . fluticasone  2 spray Each Nare QHS  . folic acid  1 mg Oral Daily  . hydrocortisone sod succinate (SOLU-CORTEF) inj  50 mg Intravenous Q12H  . insulin aspart  0-9 Units Subcutaneous TID WC  . insulin detemir  10 Units Subcutaneous QHS  . magnesium oxide  400 mg Oral 5 X Daily  . omeprazole  40 mg Oral BID AC  . posaconazole  300 mg Oral Daily  . senna  1 tablet Oral BID  . senna-docusate  1 tablet Oral QHS  . sodium chloride flush  3 mL Intravenous Q12H  . sulfamethoxazole-trimethoprim  1 tablet Oral Daily  . tacrolimus  0.5 mg Oral QPC breakfast    Continuous Infusions: . sodium chloride    . piperacillin-tazobactam (ZOSYN)  IV Stopped (07/17/17 1335)     LOS: 2 days     Desiree Hane, MD [facility] Pager [phone]  If 7PM-7AM, please contact night-coverage [email or web address] Password [facility] 07/17/2017, 5:28 PM

## 2017-07-18 DIAGNOSIS — D649 Anemia, unspecified: Secondary | ICD-10-CM

## 2017-07-18 DIAGNOSIS — R110 Nausea: Secondary | ICD-10-CM

## 2017-07-18 DIAGNOSIS — R509 Fever, unspecified: Secondary | ICD-10-CM

## 2017-07-18 DIAGNOSIS — R197 Diarrhea, unspecified: Secondary | ICD-10-CM

## 2017-07-18 DIAGNOSIS — R001 Bradycardia, unspecified: Secondary | ICD-10-CM

## 2017-07-18 DIAGNOSIS — Z9481 Bone marrow transplant status: Secondary | ICD-10-CM

## 2017-07-18 LAB — BASIC METABOLIC PANEL
Anion gap: 4 — ABNORMAL LOW (ref 5–15)
BUN: 7 mg/dL (ref 6–20)
CHLORIDE: 109 mmol/L (ref 101–111)
CO2: 27 mmol/L (ref 22–32)
CREATININE: 0.88 mg/dL (ref 0.61–1.24)
Calcium: 7.9 mg/dL — ABNORMAL LOW (ref 8.9–10.3)
GFR calc non Af Amer: >60 mL/min (ref >60)
Glucose, Bld: 95 mg/dL (ref 65–99)
POTASSIUM: 3 mmol/L — AB (ref 3.5–5.1)
SODIUM: 140 mmol/L (ref 135–145)

## 2017-07-18 LAB — GLUCOSE, CAPILLARY
GLUCOSE-CAPILLARY: 152 mg/dL — AB (ref 65–99)
GLUCOSE-CAPILLARY: 71 mg/dL (ref 65–99)
GLUCOSE-CAPILLARY: 119 mg/dL — AB (ref 65–99)
Glucose-Capillary: 195 mg/dL — ABNORMAL HIGH (ref 65–99)
Glucose-Capillary: 181 mg/dL — ABNORMAL HIGH (ref 65–99)

## 2017-07-18 LAB — CBC
HCT: 30.8 % — ABNORMAL LOW (ref 39.0–52.0)
HEMOGLOBIN: 10.3 g/dL — AB (ref 13.0–17.0)
MCH: 31.9 pg (ref 26.0–34.0)
MCHC: 33.4 g/dL (ref 30.0–36.0)
MCV: 95.4 fL (ref 78.0–100.0)
Platelets: 163 10*3/uL (ref 150–400)
RBC: 3.23 MIL/uL — AB (ref 4.22–5.81)
RDW: 15.5 % (ref 11.5–15.5)
WBC: 7 10*3/uL (ref 4.0–10.5)

## 2017-07-18 MED ORDER — INSULIN DETEMIR 100 UNIT/ML ~~LOC~~ SOLN
5.0000 [IU] | Freq: Every day | SUBCUTANEOUS | Status: DC
  Administered 2017-07-18 – 2017-07-20 (×3): 5 [IU] via SUBCUTANEOUS
  Filled 2017-07-18 (×4): qty 0.05

## 2017-07-18 MED ORDER — ATENOLOL 25 MG PO TABS
25.0000 mg | ORAL_TABLET | Freq: Every day | ORAL | Status: DC
  Administered 2017-07-19 – 2017-07-21 (×3): 25 mg via ORAL
  Filled 2017-07-18 (×3): qty 1

## 2017-07-18 MED ORDER — POTASSIUM CHLORIDE CRYS ER 20 MEQ PO TBCR
40.0000 meq | EXTENDED_RELEASE_TABLET | Freq: Once | ORAL | Status: AC
  Administered 2017-07-18: 40 meq via ORAL
  Filled 2017-07-18: qty 2

## 2017-07-18 NOTE — Consult Note (Signed)
[facility] for Infectious Disease  Total days of antibiotics 5        Day 5 vanco/piptazo        Day 5 proph of posa, acy, septra               Reason for Consult: SIRS, unexplained fever/leukocytosis    Referring Physician: nettey  Principal Problem:   Sepsis ([place]) Active Problems:   Essential hypertension   Steroid-induced hyperglycemia   MDS (myelodysplastic syndrome) ([place])   History of stem cell transplant ([place])   Deep vein thrombosis (DVT) of femoral vein of right lower extremity (HCC)   GVHD (graft versus host disease) ([place])   SIRS (systemic inflammatory response syndrome) (HCC)    HPI: Brandon [name] is a 67 y.o. male with hx of MDS s/p Bu/Cy conditioning with allo HSCT c/b mild GVHD of liver- he is month 14 post txp.His current IS of tacro 0.5 bid, pred 2.5 .also on hydrocort of 20 qam/10 qpm for adrenal insufic. proph c/o - acyclovir, posa, and septra. He was recently admitted from 1/12-1/15 for fever,n/V/d, tmax of 100.40F unable to find source of infection but discharged on 10 d course of levofloxacin which he was taking up to this admission. He was also discharged on a higher pulse dose of steroids of prednisone 79m which he took for 3 days then decreased to 172mper day.  On 1/21, he had new onset of chills and vomiting x1-2 hrs  thus readmitted.labs noteable for wbc of 27.8 with 87%N with some atypical lymphs on blood smear.ASt/ALt on admit were in the 40s, LA of 5.38. He was empirically started on vancomycin and piptazo concerning for sepsis in immunocompromised host. Blood cx NGTD. GI Multiplex PCR is negative. CDiff negative. CT imaging negative in abd/pelvis, did not small bilateral effusion. Interestingly his WBC trended down to 9.5 at 24hrs. He was kept on his abtx prophylaxis dosing and tacro was changed to 0.34m8maily. tacro level on 1/21 was 2.7. He has not have further symptoms other than mild nausea. He has remained afebrile since admit.He is concerned at the  cause of this 2nd hospitalization  He did mention that in the Fall 2018, he was placed on high doses of steroids and it needed slow taper at times to address various side effects as it related to GVHD of liver and HSCT.  Past Medical History:  Diagnosis Date  . Allergy    seasonal/animals  . Chronic prostatitis 12/17/2008  . Diverticulosis 01/16/2011   History of diverticulitis   . ERECTILE DYSFUNCTION 02/07/2007  . HYPERLIPIDEMIA 02/04/2007  . HYPERTENSION 02/04/2007  . NEOPLASM, MALIGNANT, SKIN, BACK 02/09/2009  . S/P bone marrow transplant (HCC)     Allergies: No Known Allergies  MEDICATIONS: . acyclovir  400 mg Oral BID  . amLODipine  10 mg Oral QHS  . [START ON 07/19/2017] atenolol  25 mg Oral Daily  . cholecalciferol  1,000 Units Oral Daily  . enoxaparin  80 mg Subcutaneous Q12H  . famotidine  20 mg Oral BID  . fluticasone  2 spray Each Nare QHS  . folic acid  1 mg Oral Daily  . hydrocortisone sod succinate (SOLU-CORTEF) inj  50 mg Intravenous Q12H  . insulin aspart  0-9 Units Subcutaneous TID WC  . insulin detemir  5 Units Subcutaneous QHS  . magnesium oxide  400 mg Oral 5 X Daily  . omeprazole  40 mg Oral BID AC  . posaconazole  300 mg Oral  Daily  . senna  1 tablet Oral BID  . senna-docusate  1 tablet Oral QHS  . sodium chloride flush  3 mL Intravenous Q12H  . sulfamethoxazole-trimethoprim  1 tablet Oral Daily  . tacrolimus  0.5 mg Oral QPC breakfast    Social History   Tobacco Use  . Smoking status: Never Smoker  . Smokeless tobacco: Never Used  Substance Use Topics  . Alcohol use: Yes    Alcohol/week: 1.8 oz    Types: 3 Standard drinks or equivalent per week  . Drug use: No    Family History  Problem Relation Age of Onset  . Breast cancer Mother   . CVA Mother        quit taking HTN meds  . Alzheimer's disease Father        late 34s. mid 49s  . Asperger's syndrome Son     Review of Systems  Constitutional: Negative for fever, chills, diaphoresis,  activity change, appetite change, fatigue and unexpected weight change.  HENT: Negative for congestion, sore throat, rhinorrhea, sneezing, trouble swallowing and sinus pressure.  Eyes: Negative for photophobia and visual disturbance.  Respiratory: Negative for cough, chest tightness, shortness of breath, wheezing and stridor.  Cardiovascular: Negative for chest pain, palpitations and leg swelling.  Gastrointestinal:+ nausea, + loose stools. Negative for  vomiting, abdominal pain,  constipation, blood in stool, abdominal distention and anal bleeding.  Genitourinary: Negative for dysuria, hematuria, flank pain and difficulty urinating.  Musculoskeletal: Negative for myalgias, back pain, joint swelling, arthralgias and gait problem.  Skin: Negative for color change, pallor, rash and wound.  Neurological: Negative for dizziness, tremors, weakness and light-headedness.  Hematological: Negative for adenopathy. Does not bruise/bleed easily.  Psychiatric/Behavioral: Negative for behavioral problems, confusion, sleep disturbance, dysphoric mood, decreased concentration and agitation.     OBJECTIVE: Temp:  [98.1 F (36.7 C)-98.5 F (36.9 C)] 98.1 F (36.7 C) (01/24 1500) Pulse Rate:  [42-64] 64 (01/24 1500) Resp:  [16-18] 16 (01/24 1500) BP: (119-144)/(68-89) 119/68 (01/24 1500) SpO2:  [97 %-98 %] 98 % (01/24 1500) Physical Exam  Constitutional: He is oriented to person, place, and time. He appears well-developed and well-nourished. No distress.  HENT:  Mouth/Throat: Oropharynx is clear and moist. No oropharyngeal exudate.  Cardiovascular: Normal rate, regular rhythm and normal heart sounds. Exam reveals no gallop and no friction rub.  No murmur heard.  Pulmonary/Chest: Effort normal and breath sounds normal. No respiratory distress. He has no wheezes.  Abdominal: Soft. Bowel sounds are normal. He exhibits no distension. There is no tenderness.  Lymphadenopathy:  He has no cervical adenopathy.   Neurological: He is alert and oriented to person, place, and time.  Ext: pitting edema + 1 to BLE and some swelling to dorsum of right hand Skin: Skin is warm and dry. No rash noted. No erythema.  Psychiatric: He has a normal mood and affect. His behavior is normal.    LABS: Results for orders placed or performed during the hospital encounter of 07/15/17 (from the past 48 hour(s))  Glucose, capillary     Status: Abnormal   Collection Time: 07/16/17  4:38 PM  Result Value Ref Range   Glucose-Capillary 144 (H) 65 - 99 mg/dL  Glucose, capillary     Status: Abnormal   Collection Time: 07/16/17  9:55 PM  Result Value Ref Range   Glucose-Capillary 162 (H) 65 - 99 mg/dL  CBC     Status: Abnormal   Collection Time: 07/17/17  5:29 AM  Result Value  Ref Range   WBC 8.9 4.0 - 10.5 K/uL   RBC 3.21 (L) 4.22 - 5.81 MIL/uL   Hemoglobin 10.3 (L) 13.0 - 17.0 g/dL   HCT 30.8 (L) 39.0 - 52.0 %   MCV 96.0 78.0 - 100.0 fL   MCH 32.1 26.0 - 34.0 pg   MCHC 33.4 30.0 - 36.0 g/dL   RDW 15.7 (H) 11.5 - 15.5 %   Platelets 180 150 - 400 K/uL  Basic metabolic panel     Status: Abnormal   Collection Time: 07/17/17  5:29 AM  Result Value Ref Range   Sodium 140 135 - 145 mmol/L   Potassium 2.7 (LL) 3.5 - 5.1 mmol/L    Comment: CRITICAL RESULT CALLED TO, READ BACK BY AND VERIFIED WITH: A.DICKEY RN [phone] [id number] A.QUIZON    Chloride 109 101 - 111 mmol/L   CO2 25 22 - 32 mmol/L   Glucose, Bld 102 (H) 65 - 99 mg/dL   BUN 8 6 - 20 mg/dL   Creatinine, Ser 0.95 0.61 - 1.24 mg/dL   Calcium 7.9 (L) 8.9 - 10.3 mg/dL   GFR calc non Af Amer >60 >60 mL/min   GFR calc Af Amer >60 >60 mL/min    Comment: (NOTE) The eGFR has been calculated using the CKD EPI equation. This calculation has not been validated in all clinical situations. eGFR's persistently <60 mL/min signify possible Chronic Kidney Disease.    Anion gap 6 5 - 15  Lactic acid, plasma     Status: None   Collection Time: 07/17/17  5:29 AM  Result  Value Ref Range   Lactic Acid, Venous 1.1 0.5 - 1.9 mmol/L  Hepatic function panel     Status: Abnormal   Collection Time: 07/17/17  5:29 AM  Result Value Ref Range   Total Protein 4.7 (L) 6.5 - 8.1 g/dL   Albumin 2.5 (L) 3.5 - 5.0 g/dL   AST 27 15 - 41 U/L   ALT 31 17 - 63 U/L   Alkaline Phosphatase 72 38 - 126 U/L   Total Bilirubin 0.3 0.3 - 1.2 mg/dL   Bilirubin, Direct 0.1 0.1 - 0.5 mg/dL   Indirect Bilirubin 0.2 (L) 0.3 - 0.9 mg/dL  Glucose, capillary     Status: None   Collection Time: 07/17/17  7:44 AM  Result Value Ref Range   Glucose-Capillary 71 65 - 99 mg/dL  Glucose, capillary     Status: Abnormal   Collection Time: 07/17/17 11:50 AM  Result Value Ref Range   Glucose-Capillary 123 (H) 65 - 99 mg/dL  Glucose, capillary     Status: Abnormal   Collection Time: 07/17/17  5:48 PM  Result Value Ref Range   Glucose-Capillary 152 (H) 65 - 99 mg/dL  Glucose, capillary     Status: Abnormal   Collection Time: 07/17/17  9:54 PM  Result Value Ref Range   Glucose-Capillary 123 (H) 65 - 99 mg/dL  Potassium     Status: Abnormal   Collection Time: 07/17/17 10:38 PM  Result Value Ref Range   Potassium 3.1 (L) 3.5 - 5.1 mmol/L  Magnesium     Status: Abnormal   Collection Time: 07/17/17 10:38 PM  Result Value Ref Range   Magnesium 1.6 (L) 1.7 - 2.4 mg/dL  CBC     Status: Abnormal   Collection Time: 07/18/17  4:56 AM  Result Value Ref Range   WBC 7.0 4.0 - 10.5 K/uL   RBC 3.23 (L) 4.22 - 5.81 MIL/uL  Hemoglobin 10.3 (L) 13.0 - 17.0 g/dL   HCT 30.8 (L) 39.0 - 52.0 %   MCV 95.4 78.0 - 100.0 fL   MCH 31.9 26.0 - 34.0 pg   MCHC 33.4 30.0 - 36.0 g/dL   RDW 15.5 11.5 - 15.5 %   Platelets 163 150 - 400 K/uL  Basic metabolic panel     Status: Abnormal   Collection Time: 07/18/17  4:56 AM  Result Value Ref Range   Sodium 140 135 - 145 mmol/L   Potassium 3.0 (L) 3.5 - 5.1 mmol/L   Chloride 109 101 - 111 mmol/L   CO2 27 22 - 32 mmol/L   Glucose, Bld 95 65 - 99 mg/dL   BUN 7 6 -  20 mg/dL   Creatinine, Ser 0.88 0.61 - 1.24 mg/dL   Calcium 7.9 (L) 8.9 - 10.3 mg/dL   GFR calc non Af Amer >60 >60 mL/min   GFR calc Af Amer >60 >60 mL/min    Comment: (NOTE) The eGFR has been calculated using the CKD EPI equation. This calculation has not been validated in all clinical situations. eGFR's persistently <60 mL/min signify possible Chronic Kidney Disease.    Anion gap 4 (L) 5 - 15  Glucose, capillary     Status: None   Collection Time: 07/18/17  7:37 AM  Result Value Ref Range   Glucose-Capillary 71 65 - 99 mg/dL  Glucose, capillary     Status: Abnormal   Collection Time: 07/18/17 11:36 AM  Result Value Ref Range   Glucose-Capillary 119 (H) 65 - 99 mg/dL    MICRO: Reviewed, negative IMAGING: I have personally reviewed his CT and do not see any stranding to small or large bowel concerning for colitis  Assessment/Plan:  67yo M with hx of MDS s/p HSCT c/b GVHD of liver recently admitted for gastroenteritis, has been on broad spectrum abtx since 1/12 was readmitted on 1/21 for acute onset of chills, vomiting, mild hypotension found to have leukocytosis and lactic acidosis but no other infectious causes found. I suspect that this 2nd hospitalization maybe due to acute adrenal crisis/insufficiency after a quick taper from steroids from his recent hospitalization. He was taking prednisone 76m up until 1/18 then dropped to 139mand then symptomatic on 1/21.  - recommend to discontinue current IV abtx - when ready for discharge plan to do slower prednisone taper pred 3040m 7d, follwed by 21m69m7 d then 20mg43m he sees his transplant team - would discuss this plan with his transplant team to see if they agree - continue on his current hydrocort dosing - continue on his current abtx prophylaxis with acyclovir, posaconazole plus septra - would confirm with transplant team his tacro dosing if it is either once a day vs. Twice a day given his tacro dose on admit was  2.7.  Thank you for this consultation  Spent 120 min with patient in reviewing his records, films and greater than 50% in face to face counseling

## 2017-07-18 NOTE — Care Management Important Message (Signed)
Important Message  Patient Details  Name: Brandon [name] MRN: [id number] Date of Birth: 1950-07-11   Medicare Important Message Given:  Yes    Kerin Salen 07/18/2017, 11:55 AMImportant Message  Patient Details  Name: Brandon [name] MRN: [id number] Date of Birth: 01/17/1951   Medicare Important Message Given:  Yes    Kerin Salen 07/18/2017, 11:55 AM

## 2017-07-18 NOTE — Progress Notes (Signed)
Pt had 5 beat run of vtach. Pt is not symptomatic at this time. NP on call notified. Will continue to monitor.

## 2017-07-18 NOTE — Progress Notes (Signed)
Late entry:  Pt having frequent PVCs, NP on call notified at 1020 07/07/17. Orders for lab to draw potassium and magnesium levels obtained. Results of magnesium 1.6 and potassium 3.1 reported to NP on call. NP placed orders for magnesium and potassium replacement. Will continue to monitor pt.

## 2017-07-18 NOTE — Progress Notes (Signed)
PROGRESS NOTE  Brandon [name] IHK:[id number] DOB: 1950-12-13 DOA: 07/15/2017 PCP: [name], MD  HPI/Recap of past 24 hours:  Brandon [name] is a 67 y.o. year old male with medical history significant priorof Rt LE DVT(01/2017), S/p Bone Marrow Transplant/Stem Cell transplantin October 2017with prior h/oGVHDfor MDS who ischronically on Prograf and steroids  who presented on 07/15/2017 with chills and subjective fevers with weakness after recent discharge for similar symptoms on 1/13-1/15/19 and was found to meet SIRS criteria with concern for either infection and/or adrenal insufficency.    This morning, still some intermittent stool.  Denies any abdominal pain, fevers or chills.  Patient concerned about going home too soon; he discussed potential transfer to oncologist facility  Assessment/Plan: Principal Problem:   Sepsis ([place]) Active Problems:   Essential hypertension   Steroid-induced hyperglycemia   MDS (myelodysplastic syndrome) ([place])   History of stem cell transplant ([place])   Deep vein thrombosis (DVT) of femoral vein of right lower extremity (HCC)   GVHD (graft versus host disease) ([place])   SIRS (systemic inflammatory response syndrome) (HCC)    Sepsis (tachypnea, lactic acidosis, leukocytosis) in immunocompromised patient, resolved.  Unclear etiology though currently on empiric Zosyn therapy for presumed GI source given persistent diarrhea.  Workup so far CT abdomen negative, blood cultures negative the patient's sepsis has resolved.  Zosyn.  ID consulted for further quotations given patient was recently here and we presented despite Levaquin therapy.  Suspect some symptomatology may be secondary to crisis in setting of infectious presentation  Worsening nausea, generalized malaise, concern for likely adrenal insufficiency, stable.  Patient chronically on hydrocortisone and prednisone.  After recent hospitalization prednisone taper may have been to decreased too  soon (prednisone 30 mg for 3 days and returning to home dose of 1 mg) and caused current flare.  Discussed case with patient's oncologist at [facility] on 1/23 who agrees this may be related to adrenal insufficiency and not GVHD Continue stress dose steroids(hydrocortisone 50 mg BID) and careful transition to slow prednisone taper once infectious etiology ruled out. Continue pepcid for prophylaxis, supportive care with zosyn.   Hypokalemia and hypomagnesemia.  Chronic hypomagnesium related to tacrolimus therapy. Did see some PVCs on telemetry(asymptomatic ) overnight. Will supplement both mag and potassium and continue home oral mag regimen. Closely monitor  AKI,prerenal, resolved.  Returned to baseline with fluid resuscitation.   S/p Bone Marrow Transplant/Stem Celltransplant withprior H/oGVHD Prograf level returned at 2.7.  Discussed with patient's oncologist Dr. Fanny Skates on 1/23 who recommended to continue current tacrolimus dose of 0.5mg  qd and prophylaxis meds: acyclovir, posaconazole and bactrim.  Patient is expressing concern given second admission for similar presentation and shared his thoughts about transferring to primary oncologist at [facility].  Steroid-induced hyperglycemia, relative hypoglycemia. Fasting blood glucose in the 70s with only long acting insulin overnigh ( no shrt acting). Will decrease to 5 U levemir, correction scale as needed with meals and continue to closely monitor blood glucose.  Bradycardia, patient states this is chronic even on his home atenolol. After med rec we have continued him on his home dose of atenolol. Continue to monitor. Remains asymptomatic.  Loose stool. Not overt diarrhea. But will continue to monitor. Do not suspect this is c.diff given description and wouldn't expect improvement on zosyn ( improved GI status, resolution of leukocytosis and fever)Could be worsened from zosyn. Of note patient on magnesium 5 times daily this could contribute to loose stool  as well.   Chronic Problems HTN- home  amlodipine, atenolol Right lower leg DVT- continue home lovenox ( anti Xas levels checked at [facility] as outpatient)   Code Status: Full Code  Family Communication: No family at bedside  Disposition Plan: determine antibiotic( ID consulted) and steroid regimen for infection/ likely exacerbated adrenal insufficiency,    Consultants:  ID   Procedures:  None   Antimicrobials:  Acyclovir 1/21-23  Zosyn 1/20>>>  Bactrim 1/21>>>  Vancomycin 1/22   Cultures:  Blood cultures 07/15/17: NGTD  DVT prophylaxis: Lovenox   Objective: Vitals:   07/17/17 1018 07/17/17 1544 07/17/17 2155 07/18/17 0927  BP:  136/76 (!) 144/89 125/80  Pulse: (!) 57 (!) 59 (!) 42 (!) 52  Resp:  18 18   Temp:  98.1 F (36.7 C) 98.5 F (36.9 C)   TempSrc:  Oral Oral   SpO2:  96% 97%   Weight:      Height:        Intake/Output Summary (Last 24 hours) at 07/18/2017 1413 Last data filed at 07/18/2017 0600 Gross per 24 hour  Intake 630 ml  Output -  Net 630 ml   Filed Weights   07/15/17 0413  Weight: 99.8 kg (220 lb)    Exam:  General: Lying in bed, in no apparent distres Eyes: EOMI, anicteric ENT: Oral Mucosa clear and moist Neck: normal, no appreciable JVD Cardiovascular: regular rate and rhythm, no murmurs, rubs or gallops, no edema Respiratory: Normal respiratory effort, lungs clear to auscultation bilaterally, no rales, wheezes or rhonchi Abdomen: soft, non-distended, non-tender, normal bowel sounds, no guarding or rebound tenderness Skin: No Rash Musculoskeletal:No clubbing / cyanosis. No joint deformity upper and lower extremities. Good ROM, no contractures. Normal muscle tone Neurologic: Grossly no focal neuro deficit.Mental status AAOx3, speech normal, Psychiatric:Appropriate affect, and mood  Data Reviewed: CBC: Recent Labs  Lab 07/15/17 0342 07/16/17 0607 07/17/17 0529 07/18/17 0456  WBC 27.8* 9.5 8.9 7.0  NEUTROABS 24.2*  --    --   --   HGB 15.2 10.3* 10.3* 10.3*  HCT 45.0 31.6* 30.8* 30.8*  MCV 96.4 96.0 96.0 95.4  PLT 407* 190 180 063   Basic Metabolic Panel: Recent Labs  Lab 07/15/17 0342 07/15/17 1245 07/16/17 0607 07/17/17 0529 07/17/17 2238 07/18/17 0456  NA 139 140 139 140  --  140  K 3.4* 3.5 3.0* 2.7* 3.1* 3.0*  CL 104 112* 113* 109  --  109  CO2 24 23 23 25   --  27  GLUCOSE 115* 153* 98 102*  --  95  BUN 20 18 13 8   --  7  CREATININE 1.60* 1.52* 1.10 0.95  --  0.88  CALCIUM 9.0 7.1* 7.5* 7.9*  --  7.9*  MG  --   --  1.7  --  1.6*  --    GFR: Estimated Creatinine Clearance: 98 mL/min (by C-G formula based on SCr of 0.88 mg/dL). Liver Function Tests: Recent Labs  Lab 07/15/17 0342 07/17/17 0529  AST 41 27  ALT 44 31  ALKPHOS 138* 72  BILITOT 1.1 0.3  PROT 7.1 4.7*  ALBUMIN 3.9 2.5*   No results for input(s): LIPASE, AMYLASE in the last 168 hours. No results for input(s): AMMONIA in the last 168 hours. Coagulation Profile: No results for input(s): INR, PROTIME in the last 168 hours. Cardiac Enzymes: No results for input(s): CKTOTAL, CKMB, CKMBINDEX, TROPONINI in the last 168 hours. BNP (last 3 results) No results for input(s): PROBNP in the last 8760 hours. HbA1C: No results for input(s):  HGBA1C in the last 72 hours. CBG: Recent Labs  Lab 07/17/17 1150 07/17/17 1748 07/17/17 2154 07/18/17 0737 07/18/17 1136  GLUCAP 123* 152* 123* 71 119*   Lipid Profile: No results for input(s): CHOL, HDL, LDLCALC, TRIG, CHOLHDL, LDLDIRECT in the last 72 hours. Thyroid Function Tests: No results for input(s): TSH, T4TOTAL, FREET4, T3FREE, THYROIDAB in the last 72 hours. Anemia Panel: No results for input(s): VITAMINB12, FOLATE, FERRITIN, TIBC, IRON, RETICCTPCT in the last 72 hours. Urine analysis:    Component Value Date/Time   COLORURINE YELLOW 07/15/2017 0629   APPEARANCEUR CLEAR 07/15/2017 0629   LABSPEC 1.009 07/15/2017 0629   PHURINE 5.0 07/15/2017 0629   GLUCOSEU NEGATIVE  07/15/2017 0629   GLUCOSEU NEGATIVE 12/23/2009 0742   HGBUR NEGATIVE 07/15/2017 0629   HGBUR negative 01/24/2007 0810   BILIRUBINUR NEGATIVE 07/15/2017 0629   BILIRUBINUR negative 08/10/2014 1057   BILIRUBINUR n 02/11/2013 1059   KETONESUR NEGATIVE 07/15/2017 0629   PROTEINUR NEGATIVE 07/15/2017 0629   UROBILINOGEN 0.2 08/10/2014 1057   UROBILINOGEN 0.2 12/23/2009 0742   NITRITE NEGATIVE 07/15/2017 0629   LEUKOCYTESUR NEGATIVE 07/15/2017 0629   Sepsis Labs: @LABRCNTIP (procalcitonin:4,lacticidven:4)  ) Recent Results (from the past 240 hour(s))  Blood culture (routine x 2)     Status: None (Preliminary result)   Collection Time: 07/15/17  3:42 AM  Result Value Ref Range Status   Specimen Description BLOOD LEFT ANTECUBITAL  Final   Special Requests   Final    BOTTLES DRAWN AEROBIC AND ANAEROBIC Blood Culture adequate volume   Culture   Final    NO GROWTH 3 DAYS Performed at [facility], [place] [address] [place], [place] [address]    Report Status PENDING  Incomplete  Blood culture (routine x 2)     Status: None (Preliminary result)   Collection Time: 07/15/17  3:42 AM  Result Value Ref Range Status   Specimen Description BLOOD RIGHT ANTECUBITAL  Final   Special Requests   Final    BOTTLES DRAWN AEROBIC AND ANAEROBIC Blood Culture adequate volume   Culture   Final    NO GROWTH 3 DAYS Performed at [facility], [place] [address], [place], [place] [address]    Report Status PENDING  Incomplete  C difficile quick scan w PCR reflex     Status: None   Collection Time: 07/15/17  6:48 PM  Result Value Ref Range Status   C Diff antigen NEGATIVE NEGATIVE Final   C Diff toxin NEGATIVE NEGATIVE Final   C Diff interpretation No C. difficile detected.  Final  Gastrointestinal Panel by PCR , Stool     Status: None   Collection Time: 07/16/17  3:57 PM  Result Value Ref Range Status   Campylobacter species NOT DETECTED NOT DETECTED Final   Plesimonas shigelloides NOT DETECTED  NOT DETECTED Final   Salmonella species NOT DETECTED NOT DETECTED Final   Yersinia enterocolitica NOT DETECTED NOT DETECTED Final   Vibrio species NOT DETECTED NOT DETECTED Final   Vibrio cholerae NOT DETECTED NOT DETECTED Final   Enteroaggregative E coli (EAEC) NOT DETECTED NOT DETECTED Final   Enteropathogenic E coli (EPEC) NOT DETECTED NOT DETECTED Final   Enterotoxigenic E coli (ETEC) NOT DETECTED NOT DETECTED Final   Shiga like toxin producing E coli (STEC) NOT DETECTED NOT DETECTED Final   Shigella/Enteroinvasive E coli (EIEC) NOT DETECTED NOT DETECTED Final   Cryptosporidium NOT DETECTED NOT DETECTED Final   Cyclospora cayetanensis NOT DETECTED NOT DETECTED Final  Entamoeba histolytica NOT DETECTED NOT DETECTED Final   Giardia lamblia NOT DETECTED NOT DETECTED Final   Adenovirus F40/41 NOT DETECTED NOT DETECTED Final   Astrovirus NOT DETECTED NOT DETECTED Final   Norovirus GI/GII NOT DETECTED NOT DETECTED Final   Rotavirus A NOT DETECTED NOT DETECTED Final   Sapovirus (I, II, IV, and V) NOT DETECTED NOT DETECTED Final    Comment: Performed at [facility], [address] [place],  [address]  MRSA PCR Screening     Status: None   Collection Time: 07/16/17  3:57 PM  Result Value Ref Range Status   MRSA by PCR NEGATIVE NEGATIVE Final    Comment:        The GeneXpert MRSA Assay (FDA approved for NASAL specimens only), is one component of a comprehensive MRSA colonization surveillance program. It is not intended to diagnose MRSA infection nor to guide or monitor treatment for MRSA infections.       Studies: No results found.  Scheduled Meds: . acyclovir  400 mg Oral BID  . amLODipine  10 mg Oral QHS  . [START ON 07/19/2017] atenolol  25 mg Oral Daily  . cholecalciferol  1,000 Units Oral Daily  . enoxaparin  80 mg Subcutaneous Q12H  . famotidine  20 mg Oral BID  . fluticasone  2 spray Each Nare QHS  . folic acid  1 mg Oral Daily  . hydrocortisone  sod succinate (SOLU-CORTEF) inj  50 mg Intravenous Q12H  . insulin aspart  0-9 Units Subcutaneous TID WC  . insulin detemir  5 Units Subcutaneous QHS  . magnesium oxide  400 mg Oral 5 X Daily  . omeprazole  40 mg Oral BID AC  . posaconazole  300 mg Oral Daily  . senna  1 tablet Oral BID  . senna-docusate  1 tablet Oral QHS  . sodium chloride flush  3 mL Intravenous Q12H  . sulfamethoxazole-trimethoprim  1 tablet Oral Daily  . tacrolimus  0.5 mg Oral QPC breakfast    Continuous Infusions: . sodium chloride    . piperacillin-tazobactam (ZOSYN)  IV 3.375 g (07/18/17 0926)     LOS: 3 days     Desiree Hane, MD [facility] Pager [phone]  If 7PM-7AM, please contact night-coverage [email or web address] Password [facility] 07/18/2017, 2:13 PM

## 2017-07-18 NOTE — Progress Notes (Signed)
HEMATOLOGY-ONCOLOGY PROGRESS NOTE  SUBJECTIVE: Profound nausea vomiting and diarrhea with chills and temperature, admitted with suspicion of sepsis and adrenal insufficiency.  Currently on Zosyn and his symptoms have improved.  Although he still has some diarrhea.  OBJECTIVE: REVIEW OF SYSTEMS:   Constitutional: Denies fevers, chills or abnormal weight loss Eyes: Denies blurriness of vision Ears, nose, mouth, throat, and face: Denies mucositis or sore throat Respiratory: Denies cough, dyspnea or wheezes Cardiovascular: Denies palpitation, chest discomfort Gastrointestinal: Continues to have mild diarrhea Skin: Denies abnormal skin rashes Lymphatics: Denies new lymphadenopathy or easy bruising Neurological:Denies numbness, tingling or new weaknesses Behavioral/Psych: Mood is stable, no new changes  Extremities: No lower extremity edema All other systems were reviewed with the patient and are negative.  I have reviewed the past medical history, past surgical history, social history and family history with the patient and they are unchanged from previous note.   PHYSICAL EXAMINATION: ECOG PERFORMANCE STATUS: 1 - Symptomatic but completely ambulatory  Vitals:   07/17/17 2155 07/18/17 0927  BP: (!) 144/89 125/80  Pulse: (!) 42 (!) 52  Resp: 18   Temp: 98.5 F (36.9 C)   SpO2: 97%    Filed Weights   07/15/17 0413  Weight: 220 lb (99.8 kg)    GENERAL:alert, no distress and comfortable SKIN: skin color, texture, turgor are normal, no rashes or significant lesions EYES: normal, Conjunctiva are pink and non-injected, sclera clear OROPHARYNX:no exudate, no erythema and lips, buccal mucosa, and tongue normal  NECK: supple, thyroid normal size, non-tender, without nodularity LYMPH:  no palpable lymphadenopathy in the cervical, axillary or inguinal LUNGS: clear to auscultation and percussion with normal breathing effort HEART: regular rate & rhythm and no murmurs and no lower  extremity edema ABDOMEN:abdomen soft, non-tender and normal bowel sounds Musculoskeletal:no cyanosis of digits and no clubbing  NEURO: alert & oriented x 3 with fluent speech, no focal motor/sensory deficits  LABORATORY DATA:  I have reviewed the data as listed CMP Latest Ref Rng & Units 07/18/2017 07/17/2017 07/17/2017  Glucose 65 - 99 mg/dL 95 - 102(H)  BUN 6 - 20 mg/dL 7 - 8  Creatinine 0.61 - 1.24 mg/dL 0.88 - 0.95  Sodium 135 - 145 mmol/L 140 - 140  Potassium 3.5 - 5.1 mmol/L 3.0(L) 3.1(L) 2.7(LL)  Chloride 101 - 111 mmol/L 109 - 109  CO2 22 - 32 mmol/L 27 - 25  Calcium 8.9 - 10.3 mg/dL 7.9(L) - 7.9(L)  Total Protein 6.5 - 8.1 g/dL - - 4.7(L)  Total Bilirubin 0.3 - 1.2 mg/dL - - 0.3  Alkaline Phos 38 - 126 U/L - - 72  AST 15 - 41 U/L - - 27  ALT 17 - 63 U/L - - 31    Lab Results  Component Value Date   WBC 7.0 07/18/2017   HGB 10.3 (L) 07/18/2017   HCT 30.8 (L) 07/18/2017   MCV 95.4 07/18/2017   PLT 163 07/18/2017   NEUTROABS 24.2 (H) 07/15/2017    ASSESSMENT AND PLAN: 1.  Nausea vomiting diarrhea: With a history of bone marrow transplant allogenic for AML/high risk MDS Differential diagnosis is between infection versus graft-versus-host disease. Currently on Zosyn. Infectious disease has been consulted. Cultures so far have been negative.  2. patient is not neutropenic. 3.  Anemia hemoglobin 10.3.  If the patient does not seem to be getting better by tomorrow, it is ideal to transport him to [facility] for tertiary care.  So that he can be under direct supervision from  his transplant physicians.

## 2017-07-19 DIAGNOSIS — D89813 Graft-versus-host disease, unspecified: Secondary | ICD-10-CM

## 2017-07-19 DIAGNOSIS — D469 Myelodysplastic syndrome, unspecified: Secondary | ICD-10-CM

## 2017-07-19 DIAGNOSIS — I82411 Acute embolism and thrombosis of right femoral vein: Secondary | ICD-10-CM

## 2017-07-19 LAB — GLUCOSE, CAPILLARY
GLUCOSE-CAPILLARY: 136 mg/dL — AB (ref 65–99)
Glucose-Capillary: 82 mg/dL (ref 65–99)
Glucose-Capillary: 100 mg/dL — ABNORMAL HIGH (ref 65–99)
Glucose-Capillary: 190 mg/dL — ABNORMAL HIGH (ref 65–99)

## 2017-07-19 LAB — BASIC METABOLIC PANEL
Anion gap: 7 (ref 5–15)
BUN: 8 mg/dL (ref 6–20)
CALCIUM: 8.2 mg/dL — AB (ref 8.9–10.3)
CO2: 28 mmol/L (ref 22–32)
CREATININE: 0.85 mg/dL (ref 0.61–1.24)
Chloride: 104 mmol/L (ref 101–111)
GFR calc Af Amer: >60 mL/min (ref >60)
GFR calc non Af Amer: >60 mL/min (ref >60)
GLUCOSE: 107 mg/dL — AB (ref 65–99)
Potassium: 3.3 mmol/L — ABNORMAL LOW (ref 3.5–5.1)
Sodium: 139 mmol/L (ref 135–145)

## 2017-07-19 LAB — CBC
HCT: 32.2 % — ABNORMAL LOW (ref 39.0–52.0)
Hemoglobin: 10.7 g/dL — ABNORMAL LOW (ref 13.0–17.0)
MCH: 31.2 pg (ref 26.0–34.0)
MCHC: 33.2 g/dL (ref 30.0–36.0)
MCV: 93.9 fL (ref 78.0–100.0)
Platelets: 123 10*3/uL — ABNORMAL LOW (ref 150–400)
RBC: 3.43 MIL/uL — ABNORMAL LOW (ref 4.22–5.81)
RDW: 15.5 % (ref 11.5–15.5)
WBC: 5.5 10*3/uL (ref 4.0–10.5)

## 2017-07-19 LAB — MAGNESIUM: MAGNESIUM: 1.7 mg/dL (ref 1.7–2.4)

## 2017-07-19 MED ORDER — HYDROCORTISONE 20 MG PO TABS
30.0000 mg | ORAL_TABLET | Freq: Every evening | ORAL | Status: DC
  Administered 2017-07-19 – 2017-07-20 (×2): 30 mg via ORAL
  Filled 2017-07-19 (×3): qty 1

## 2017-07-19 MED ORDER — POTASSIUM CHLORIDE CRYS ER 20 MEQ PO TBCR
40.0000 meq | EXTENDED_RELEASE_TABLET | Freq: Once | ORAL | Status: AC
  Administered 2017-07-19: 40 meq via ORAL
  Filled 2017-07-19: qty 2

## 2017-07-19 MED ORDER — HYDROCORTISONE 20 MG PO TABS
50.0000 mg | ORAL_TABLET | Freq: Every morning | ORAL | Status: DC
  Administered 2017-07-19 – 2017-07-21 (×3): 50 mg via ORAL
  Filled 2017-07-19 (×3): qty 1

## 2017-07-19 MED ORDER — POTASSIUM CHLORIDE 20 MEQ PO PACK: 40.0000 meq | PACK | Freq: Once | ORAL | Status: DC

## 2017-07-19 NOTE — Discharge Summary (Addendum)
Discharge Summary  Brandon [name] OEU:[id number] DOB: 1951/06/07  PCP: [name], MD  Admit date: 07/15/2017 Discharge date: 07/21/2017  Time spent: < 25 minutes  Admitted From: Home Disposition:  Home  Recommendations for Outpatient Follow-up:  1. Follow up with PCP in 1-2 weeks. Pt will make appt for lab draw on 1/29. Will see oncologist on 07/26/17 2. Please obtain BMP(Mg, K), CBC(wbc) 3. Continue 7 day course of Flagyl and Cefpodoxime 4. Hydrocortisone taper 50mg  am/30 mg pm thru 1/28, followed by 40 mg am/30 mg pm starting 1/29 until seen by oncologist at [facility], Dr. Stefani Dama 5. Levemir decreased to 5 U, follow up blood glucose monitoring  Home Health:NO Equipment/Devices:None   Discharge Diagnoses:  Active Hospital Problems   Diagnosis Date Noted  . Sepsis ([place]) 07/15/2017  . SIRS (systemic inflammatory response syndrome) ([place]) 07/15/2017  . History of stem cell transplant ([place]) 03/04/2017  . GVHD (graft versus host disease) ([place]) 03/04/2017  . Deep vein thrombosis (DVT) of femoral vein of right lower extremity ([place]) 03/04/2017  . MDS (myelodysplastic syndrome) ([place]) 12/06/2015  . Steroid-induced hyperglycemia 08/16/2014  . Essential hypertension 02/04/2007    Resolved Hospital Problems  No resolved problems to display.    Discharge Condition:Stable  CODE STATUS:FULL  Diet recommendation: Heart Healthy  Vitals:   07/21/17 0451 07/21/17 0949  BP: 127/84   Pulse: (!) 50 82  Resp: 16   Temp: 98 F (36.7 C)   SpO2: 95%     History of present illness:  Brandon [name] is a 67 y.o. year old male with medical history significant for of Rt LE DVT(01/2017), S/p Bone Marrow Transplant/Stem Cell transplantin October 2017with prior h/oGVHDfor MDS who ischronically on Prograf and steroids  who presented on 07/15/2017 with chills and subjective fevers with weakness after recent discharge for similar symptoms on 1/13-1/15/19 and was found to meet SIRS  criteria with concern for either infection and/or adrenal insufficency.   Hospital Course:  Principal Problem:   Sepsis ([place]) Active Problems:   Essential hypertension   Steroid-induced hyperglycemia   MDS (myelodysplastic syndrome) ([facility])   History of stem cell transplant ([place])   Deep vein thrombosis (DVT) of femoral vein of right lower extremity ([facility])   GVHD (graft versus host disease) ([facility])   SIRS (systemic inflammatory response syndrome) ([facility])    Sepsis (tachypnea, lactic acidosis, leukocytosis) in immunocompromised patient, resolved.  Nausea, generalized malaise, concern for likely adrenal insufficiency or GI infection,resolved.    He was recently admitted on 1/12 through 1/15 for fever, nausea vomiting and diarrhea with T-max 100.3.  Unfortunately during the hospitalization no source of infection was was found patient was discharged on 10-day course of Levaquin.  He was also discharged on prednisone taper.  Regimen included 30 mg for 3 days followed by 10 mg/day (started on 1/18)  Patient to begin taking his lower dose of prednisone on 1/21 he did experience subjective chills, and vomiting.    On ED presentation he had white count of 27.8 (WBC: 8.3 on discharge on 1/15), lactic acid of 5.38. Empiric therapy with vancomycin and zosyn were started for sepsis presumed secondary to GI source given some abdominal discomfort and diarrhea.  Interestingly, within 24 hours WBC trended down to 9.5.  In workup, CT abdomen was negative, GI pathogen panel and C.diff toxin were also negative.  Blood cultures remained negative so after 48 hours Vanc was discontinued.  ID was consulted who though symptoms were more consistent with adrenal crisis given his  symptoms improved significantly over the course of 12 hours and the timeline of his symptoms seem to correlate with the above mentioned prednisone taper ( started lower dose of prednison e on 1/18).  He remained afebrile throught stay, no other symptoms  other than intermittent mild nausea.  Brandon [name] has a very complicated case because of his steroid dependence and this atypical presentation after a recent admission. I discussed his case with his [facility] oncologist. After stress steroids his hydrocortisone was increased to 50 mg a.m. and 30 mg p.m with plan for slow taper ( mentioned above). Additionally, given concern for possible undetected bacteremia in fairly immunosuppressed patient he was discharged on cefpodxime and flagyl for an additional 7 days to ensure 14 total days of antibiotic therapy ( 7 prior days of zosyn).  No pseudomonal coverage was thought to be necessary as would expect something on blood cultures.   Plan was discussed extensively with Brandon [name] and his wife on day of discharge.   Hypokalemia and hypomagnesemia.  Chronic hypomagnesium related to tacrolimus therapy.  Patient did require some IV supplementation of magnesium however that resolved and patient was able to have normal levels while on home dose of oral magnesium.  Even with resolution of hypomagnesemia patient's potassium ranged from 3-3.3.  On day of discharge patient's potassium was 3.1.  Patient was given oral potassium (40 mEq) prior to discharge.  Given propensity for tacrolimus to cause hyperkalemia, no oral potassium was supplied on discharge patient.  He was instructed to have BMP repeated later this week with PCP.    AKI,prerenal, resolved.    Creatinine 1.6 returned to baseline with fluid resuscitation with time normal saline.  Creatinine remained at baseline with p.o. diet.  On discharge creatinine 0.91  S/p Bone Marrow Transplant/Stem Celltransplant withprior H/oGVHD Prograf level returned at 2.7.  Discussed with patient's oncologist Dr. Daniel Nones on 1/23 who recommended to continue current tacrolimus dose of 0.5mg  qd.  He was also continued on prophylaxis meds: acyclovir, posaconazole and bactrim.   Steroid-induced hyperglycemia.  On 1/24 fasting  blood glucose was found to be 70s.  Patient did not have any symptoms, however given fasting blood glucose range of 70-80 patient's home Levemir was decreased to 5 units.  With that change fasting glucose levels improved throughout hospitalization.  Patient discharged on decreased Levemir will need to follow-up as outpatient.   Bradycardia, patient states this is chronic on his home atenolol.  Heart rate ranged from 51-56.  On day of discharge heart rate of 82.  Remained asymptomatic throughout hospitalization.  Discharged on home dose of atenolol.  Loose stool, resolved(as mentioned above)  Chronic Problems HTN- home amlodipine, atenolol Right lower leg DVT- continue home lovenox ( anti Xas levels checked at [facility] as outpatient)  Microbiology:  07/06/17: Blood culture x 1. Negative growth to date 07/06/17: Urine culture < 10,000 colonies, insignificant growth 07/07/17 : Blood culture x1. Negative growth to date 07/15/17 : Blood culture x2. Ngative growth to date   Consultations:  Infectious Disease   Procedures/Studies: Dg Chest 2 View  Result Date: 07/15/2017 CLINICAL DATA:  Chills, diaphoresis, vomiting. Sudden onset 1 hour ago. EXAM: CHEST  2 VIEW COMPARISON:  07/06/2017 FINDINGS: Shallow inspiration. Cardiac enlargement. No vascular congestion or edema. No focal consolidation. No blunting of costophrenic angles. No pneumothorax. Mediastinal contours appear intact. Degenerative changes in the spine. IMPRESSION: Cardiac enlargement.  No evidence of active pulmonary disease. Electronically Signed   By: Lucienne Capers M.D.   On: 07/15/2017  04:51   Ct Abdomen Pelvis W Contrast  Result Date: 07/15/2017 CLINICAL DATA:  Abdominal pain, fever.  Abscess suspected. EXAM: CT ABDOMEN AND PELVIS WITH CONTRAST TECHNIQUE: Multidetector CT imaging of the abdomen and pelvis was performed using the standard protocol following bolus administration of intravenous contrast. CONTRAST:  132mL ISOVUE-300  IOPAMIDOL (ISOVUE-300) INJECTION 61% COMPARISON:  None FINDINGS: Lower chest: Small bilateral pleural effusions with overlying areas of atelectasis noted. Hepatobiliary: No suspicious liver abnormality. The gallbladder appears normal. No biliary dilatation. Pancreas: Unremarkable. No pancreatic ductal dilatation or surrounding inflammatory changes. Spleen: Normal in size without focal abnormality. Adrenals/Urinary Tract: The adrenal glands are normal. Unremarkable appearance of both kidneys. No mass or hydronephrosis identified. The urinary bladder appears normal. Stomach/Bowel: Stomach normal. No pathologic dilatation of the large or small bowel loops. The cecum is located within the right upper quadrant of the abdomen. Nonvisualization of the appendix. Enteric contrast material is identified within the colon up to the level of the rectum. Scattered colonic diverticula noted without acute inflammation. Vascular/Lymphatic: Normal appearance of the abdominal aorta. No enlarged retroperitoneal or mesenteric adenopathy. No enlarged pelvic or inguinal lymph nodes. Reproductive: The prostate gland appears normal. Other: There is no free fluid or fluid collections within the abdomen or pelvis. Musculoskeletal: Degenerative disc disease noted within the lower thoracic and lumbar spine. No aggressive lytic or sclerotic bone lesions identified. First degree anterolisthesis of L4 on L5 noted. IMPRESSION: 1. No acute findings within the abdomen or pelvis. No evidence for abscess. 2. Small bilateral pleural effusions and lower lobe subsegmental atelectasis. Electronically Signed   By: Kerby Moors M.D.   On: 07/15/2017 14:52   Dg Chest Port 1 View  Result Date: 07/06/2017 CLINICAL DATA:  Fever, chills and nausea.  Shortness of breath. EXAM: PORTABLE CHEST 1 VIEW COMPARISON:  01/08/2016. FINDINGS: Poor inspiration. Borderline enlarged cardiac silhouette, magnified by the poor inspiration and portable AP technique. Clear  lungs with normal vascularity. Thoracic spine degenerative changes. IMPRESSION: No acute abnormality. Electronically Signed   By: Claudie Revering M.D.   On: 07/06/2017 20:41     Discharge Exam: BP 127/84 (BP Location: Left Arm)   Pulse 82   Temp 98 F (36.7 C) (Oral)   Resp 16   Ht 5\' 11"  (1.803 m)   Wt 99.8 kg (220 lb)   SpO2 95%   BMI 30.68 kg/m   General: in no distress, sitting in bed comfortably, pleasant in conversation Cardiovascular: Bradycardic, normal rhythm, no murmurs rubs or gallops.  Trace pitting edema of bilateral ankles Respiratory: Clear breath sounds bilaterally, no rales, rhonchi or wheezes GI: Soft abdomen, nondistended, nontender, normoactive bowel sounds   Discharge Instructions You were cared for by a hospitalist during your hospital stay. If you have any questions about your discharge medications or the care you received while you were in the hospital after you are discharged, you can call the unit and asked to speak with the hospitalist on call if the hospitalist that took care of you is not available. Once you are discharged, your primary care physician will handle any further medical issues. Please note that NO REFILLS for any discharge medications will be authorized once you are discharged, as it is imperative that you return to your primary care physician (or establish a relationship with a primary care physician if you do not have one) for your aftercare needs so that they can reassess your need for medications and monitor your lab values.  Discharge Instructions    Diet -  low sodium heart healthy   Complete by:  As directed    Diet - low sodium heart healthy   Complete by:  As directed    Increase activity slowly   Complete by:  As directed    Increase activity slowly   Complete by:  As directed      Allergies as of 07/21/2017   No Known Allergies     Medication List    STOP taking these medications   levofloxacin 500 MG tablet Commonly known  as:  LEVAQUIN   predniSONE 1 MG tablet Commonly known as:  DELTASONE     TAKE these medications   acyclovir 400 MG tablet Commonly known as:  ZOVIRAX Take 400 mg by mouth 2 (two) times daily.   amLODipine 10 MG tablet Commonly known as:  NORVASC Take 10 mg by mouth at bedtime.   atenolol 50 MG tablet Commonly known as:  TENORMIN Take 0.5 tablets (25 mg total) by mouth every evening.   cefpodoxime 200 MG tablet Commonly known as:  VANTIN Take 1 tablet (200 mg total) by mouth 2 (two) times daily for 7 days.   cetirizine 10 MG tablet Commonly known as:  ZYRTEC Take 10 mg by mouth at bedtime.   cholecalciferol 1000 units tablet Commonly known as:  VITAMIN D Take 1,000 Units by mouth daily.   enoxaparin 80 MG/0.8ML injection Commonly known as:  LOVENOX Inject 80 mg into the skin every 12 (twelve) hours.   fluticasone 50 MCG/ACT nasal spray Commonly known as:  FLONASE Place 2 sprays into both nostrils at bedtime.   folic acid 277 MCG tablet Commonly known as:  FOLVITE Take 800 mcg by mouth daily.   hydrocortisone 10 MG tablet Commonly known as:  CORTEF Take 5 tablets in AM and 3 tablet in afternoon thru 1/28. Then Take 4 tablets in AM and 3 tablets in afternoon starting 1/29 What changed:    how much to take  how to take this  when to take this  additional instructions   insulin detemir 100 UNIT/ML injection Commonly known as:  LEVEMIR Decrease Levemir to 5 U at night What changed:    how much to take  how to take this  when to take this  additional instructions   insulin regular 100 units/mL injection Commonly known as:  NOVOLIN R,HUMULIN R Inject into the skin 3 (three) times daily before meals. Sliding scale   Magnesium Oxide 400 MG Caps Take 1 capsule (400 mg total) by mouth 5 (five) times daily.   metroNIDAZOLE 500 MG tablet Commonly known as:  FLAGYL Take 1 tablet (500 mg total) by mouth 3 (three) times daily for 7 days.   omeprazole 20  MG tablet Commonly known as:  PRILOSEC OTC Take 2 tablets (40 mg total) by mouth 2 (two) times daily. take 1 tablet by mouth once daily What changed:  additional instructions   ondansetron 4 MG tablet Commonly known as:  ZOFRAN Take 1 tablet (4 mg total) by mouth every 8 (eight) hours as needed for nausea or vomiting.   polyethylene glycol packet Commonly known as:  MIRALAX / GLYCOLAX Take 17 g by mouth daily as needed for mild constipation or moderate constipation.   posaconazole 100 MG Tbec delayed-release tablet Commonly known as:  NOXAFIL Take 300 mg by mouth daily.   senna-docusate 8.6-50 MG tablet Commonly known as:  Senokot-S Take 1 tablet by mouth Nightly.   sulfamethoxazole-trimethoprim 400-80 MG tablet Commonly known as:  BACTRIM,SEPTRA Take  1 tablet by mouth daily.   tacrolimus 0.5 MG capsule Commonly known as:  PROGRAF Take 0.5 mg by mouth daily after breakfast.      No Known Allergies    The results of significant diagnostics from this hospitalization (including imaging, microbiology, ancillary and laboratory) are listed below for reference.    Significant Diagnostic Studies: Dg Chest 2 View  Result Date: 07/15/2017 CLINICAL DATA:  Chills, diaphoresis, vomiting. Sudden onset 1 hour ago. EXAM: CHEST  2 VIEW COMPARISON:  07/06/2017 FINDINGS: Shallow inspiration. Cardiac enlargement. No vascular congestion or edema. No focal consolidation. No blunting of costophrenic angles. No pneumothorax. Mediastinal contours appear intact. Degenerative changes in the spine. IMPRESSION: Cardiac enlargement.  No evidence of active pulmonary disease. Electronically Signed   By: Lucienne Capers M.D.   On: 07/15/2017 04:51   Ct Abdomen Pelvis W Contrast  Result Date: 07/15/2017 CLINICAL DATA:  Abdominal pain, fever.  Abscess suspected. EXAM: CT ABDOMEN AND PELVIS WITH CONTRAST TECHNIQUE: Multidetector CT imaging of the abdomen and pelvis was performed using the standard protocol  following bolus administration of intravenous contrast. CONTRAST:  165mL ISOVUE-300 IOPAMIDOL (ISOVUE-300) INJECTION 61% COMPARISON:  None FINDINGS: Lower chest: Small bilateral pleural effusions with overlying areas of atelectasis noted. Hepatobiliary: No suspicious liver abnormality. The gallbladder appears normal. No biliary dilatation. Pancreas: Unremarkable. No pancreatic ductal dilatation or surrounding inflammatory changes. Spleen: Normal in size without focal abnormality. Adrenals/Urinary Tract: The adrenal glands are normal. Unremarkable appearance of both kidneys. No mass or hydronephrosis identified. The urinary bladder appears normal. Stomach/Bowel: Stomach normal. No pathologic dilatation of the large or small bowel loops. The cecum is located within the right upper quadrant of the abdomen. Nonvisualization of the appendix. Enteric contrast material is identified within the colon up to the level of the rectum. Scattered colonic diverticula noted without acute inflammation. Vascular/Lymphatic: Normal appearance of the abdominal aorta. No enlarged retroperitoneal or mesenteric adenopathy. No enlarged pelvic or inguinal lymph nodes. Reproductive: The prostate gland appears normal. Other: There is no free fluid or fluid collections within the abdomen or pelvis. Musculoskeletal: Degenerative disc disease noted within the lower thoracic and lumbar spine. No aggressive lytic or sclerotic bone lesions identified. First degree anterolisthesis of L4 on L5 noted. IMPRESSION: 1. No acute findings within the abdomen or pelvis. No evidence for abscess. 2. Small bilateral pleural effusions and lower lobe subsegmental atelectasis. Electronically Signed   By: Kerby Moors M.D.   On: 07/15/2017 14:52   Dg Chest Port 1 View  Result Date: 07/06/2017 CLINICAL DATA:  Fever, chills and nausea.  Shortness of breath. EXAM: PORTABLE CHEST 1 VIEW COMPARISON:  01/08/2016. FINDINGS: Poor inspiration. Borderline enlarged  cardiac silhouette, magnified by the poor inspiration and portable AP technique. Clear lungs with normal vascularity. Thoracic spine degenerative changes. IMPRESSION: No acute abnormality. Electronically Signed   By: Claudie Revering M.D.   On: 07/06/2017 20:41    Microbiology: Recent Results (from the past 240 hour(s))  Blood culture (routine x 2)     Status: None   Collection Time: 07/15/17  3:42 AM  Result Value Ref Range Status   Specimen Description BLOOD LEFT ANTECUBITAL  Final   Special Requests   Final    BOTTLES DRAWN AEROBIC AND ANAEROBIC Blood Culture adequate volume   Culture   Final    NO GROWTH 5 DAYS Performed at [facility], [address], [place], [name] [address]    Report Status 07/20/2017 FINAL  Final  Blood culture (routine x  2)     Status: None   Collection Time: 07/15/17  3:42 AM  Result Value Ref Range Status   Specimen Description BLOOD RIGHT ANTECUBITAL  Final   Special Requests   Final    BOTTLES DRAWN AEROBIC AND ANAEROBIC Blood Culture adequate volume   Culture   Final    NO GROWTH 5 DAYS Performed at [facility], [address], [place], [place] [address]    Report Status 07/20/2017 FINAL  Final  C difficile quick scan w PCR reflex     Status: None   Collection Time: 07/15/17  6:48 PM  Result Value Ref Range Status   C Diff antigen NEGATIVE NEGATIVE Final   C Diff toxin NEGATIVE NEGATIVE Final   C Diff interpretation No C. difficile detected.  Final  Gastrointestinal Panel by PCR , Stool     Status: None   Collection Time: 07/16/17  3:57 PM  Result Value Ref Range Status   Campylobacter species NOT DETECTED NOT DETECTED Final   Plesimonas shigelloides NOT DETECTED NOT DETECTED Final   Salmonella species NOT DETECTED NOT DETECTED Final   Yersinia enterocolitica NOT DETECTED NOT DETECTED Final   Vibrio species NOT DETECTED NOT DETECTED Final   Vibrio cholerae NOT DETECTED NOT DETECTED Final   Enteroaggregative E coli (EAEC) NOT  DETECTED NOT DETECTED Final   Enteropathogenic E coli (EPEC) NOT DETECTED NOT DETECTED Final   Enterotoxigenic E coli (ETEC) NOT DETECTED NOT DETECTED Final   Shiga like toxin producing E coli (STEC) NOT DETECTED NOT DETECTED Final   Shigella/Enteroinvasive E coli (EIEC) NOT DETECTED NOT DETECTED Final   Cryptosporidium NOT DETECTED NOT DETECTED Final   Cyclospora cayetanensis NOT DETECTED NOT DETECTED Final   Entamoeba histolytica NOT DETECTED NOT DETECTED Final   Giardia lamblia NOT DETECTED NOT DETECTED Final   Adenovirus F40/41 NOT DETECTED NOT DETECTED Final   Astrovirus NOT DETECTED NOT DETECTED Final   Norovirus GI/GII NOT DETECTED NOT DETECTED Final   Rotavirus A NOT DETECTED NOT DETECTED Final   Sapovirus (I, II, IV, and V) NOT DETECTED NOT DETECTED Final    Comment: Performed at [facility], [address] [place], [place] [address]  MRSA PCR Screening     Status: None   Collection Time: 07/16/17  3:57 PM  Result Value Ref Range Status   MRSA by PCR NEGATIVE NEGATIVE Final    Comment:        The GeneXpert MRSA Assay (FDA approved for NASAL specimens only), is one component of a comprehensive MRSA colonization surveillance program. It is not intended to diagnose MRSA infection nor to guide or monitor treatment for MRSA infections.      Labs: Basic Metabolic Panel: Recent Labs  Lab 07/16/17 0607 07/17/17 0529 07/17/17 2238 07/18/17 0456 07/19/17 0511 07/20/17 0847 07/21/17 0801  NA 139 140  --  140 139 139 139  K 3.0* 2.7* 3.1* 3.0* 3.3* 3.2* 3.1*  CL 113* 109  --  109 104 105 106  CO2 23 25  --  27 28 26 26   GLUCOSE 98 102*  --  95 107* 136* 87  BUN 13 8  --  7 8 11 10   CREATININE 1.10 0.95  --  0.88 0.85 0.96 0.91  CALCIUM 7.5* 7.9*  --  7.9* 8.2* 8.6* 8.6*  MG 1.7  --  1.6*  --  1.7  --   --    Liver Function Tests: Recent Labs  Lab 07/15/17 0342 07/17/17  0529  AST 41 27  ALT 44 31  ALKPHOS 138* 72  BILITOT 1.1 0.3  PROT 7.1 4.7*    ALBUMIN 3.9 2.5*   No results for input(s): LIPASE, AMYLASE in the last 168 hours. No results for input(s): AMMONIA in the last 168 hours. CBC: Recent Labs  Lab 07/15/17 0342  07/17/17 0529 07/18/17 0456 07/19/17 0511 07/20/17 0847 07/21/17 0801  WBC 27.8*   < > 8.9 7.0 5.5 7.2 7.1  NEUTROABS 24.2*  --   --   --   --   --   --   HGB 15.2   < > 10.3* 10.3* 10.7* 12.0* 12.5*  HCT 45.0   < > 30.8* 30.8* 32.2* 36.3* 38.3*  MCV 96.4   < > 96.0 95.4 93.9 95.0 95.5  PLT 407*   < > 180 163 123* 189 200   < > = values in this interval not displayed.   Cardiac Enzymes: No results for input(s): CKTOTAL, CKMB, CKMBINDEX, TROPONINI in the last 168 hours. BNP: BNP (last 3 results) No results for input(s): BNP in the last 8760 hours.  ProBNP (last 3 results) No results for input(s): PROBNP in the last 8760 hours.  CBG: Recent Labs  Lab 07/20/17 1138 07/20/17 1655 07/20/17 2029 07/21/17 0742 07/21/17 1150  GLUCAP 102* 183* 139* 85 110*       Signed:  Desiree Hane, MD [facility] 07/21/2017, 12:00 PM

## 2017-07-19 NOTE — Progress Notes (Signed)
PROGRESS NOTE  Brandon [name] XHB:[id number] DOB: 1951/02/01 DOA: 07/15/2017 PCP: [name], MD  HPI/Recap of past 24 hours:  Brandon [name] is a 67 y.o. year old male with medical history significant priorof Rt LE DVT(01/2017), S/p Bone Marrow Transplant/Stem Cell transplantin October 2017with prior h/oGVHDfor MDS who ischronically on Prograf and steroids  who presented on 07/15/2017 with chills and subjective fevers with weakness after recent discharge for similar symptoms on 1/13-1/15/19 and was found to meet SIRS criteria with concern for either infection and/or adrenal insufficency.    This morning no abdominal pain, no fevers, no chills no cough, no dysuria.  Assessment/Plan: Principal Problem:   Sepsis ([place]) Active Problems:   Essential hypertension   Steroid-induced hyperglycemia   MDS (myelodysplastic syndrome) (HCC)   History of stem cell transplant ([place])   Deep vein thrombosis (DVT) of femoral vein of right lower extremity (HCC)   GVHD (graft versus host disease) (HCC)   SIRS (systemic inflammatory response syndrome) (HCC)    Sepsis (tachypnea, lactic acidosis, leukocytosis) in immunocompromised patient, resolved.  Unclear etiology though currently on empiric Zosyn therapy for presumed GI source given persistent diarrhea.  Workup so far CT abdomen negative, blood cultures negative the patient's sepsis has resolved.  Zosyn.  ID consulted for further recommendations given patient was recently here and we presented despite Levaquin therapy.  After discussion with ID recommended discontinuation of IV Zosyn.  Discussed this plan with patient's oncologist, given timeline of events agreed to continue Zosyn to complete 5-day course and monitor patient off IV antibiotics to assure no new fever or worsening clinical status.  Nausea, generalized malaise, concern for likely adrenal insufficiency, stable.  Patient chronically on hydrocortisone and prednisone.  Suspect most  likely related to adrenal insufficiency given patient previously discharged on prednisone 30 mg been tapered to prednisone 10 mg.  Somewhat atypical given patient is on relatively high doses of hydrocortisone.  This was discussed with his oncologist on 1/25.  Given recent admission for similar presentation and patient was suppressed immune system will continue IV Zosyn per 24 more hours.  Transition from IV stress dose steroids to increased dose of hydrocortisone 50 mg in the a.m., 30 mg in the p.m. per recommendation of oncologist to avoid any adrenal crisis once ready for discharge. Hypokalemia and hypomagnesemia, stable.  Chronic hypomagnesium related to tacrolimus therapy.  Will supplement both mag and potassium and continue home oral mag regimen. Closely monitor  AKI,prerenal, resolved.  Returned to baseline with fluid resuscitation.   S/p Bone Marrow Transplant/Stem Celltransplant withprior H/oGVHD Prograf level returned at 2.7.  Discussed with patient's oncologist Dr. Fanny Skates on 1/23 who recommended to continue current tacrolimus dose of 0.5mg  qd and prophylaxis meds: acyclovir, posaconazole and bactrim.  Will have close follow up with his oncologist  Steroid-induced hyperglycemia, relative hypoglycemia, improved No episodes of hypoglycemia overnight with decreased dose of 5 U levemir, correction scale as needed with meals and continue to closely monitor blood glucose.  Bradycardia,stable  patient states this is chronic even on his home atenolol. After med rec we have continued him on his home dose of atenolol. Continue to monitor. Remains asymptomatic.  Loose stool. Not overt diarrhea. But will continue to monitor. Do not suspect this is c.diff given description and wouldn't expect improvement on zosyn ( improved GI status, resolution of leukocytosis and fever)Could be worsened from zosyn. Of note patient on magnesium 5 times daily this could contribute to loose stool as  well.  Thrombocytopenia, new.  Cited very rare effect and Zosyn is decreasing platelet counts.  Unclear if that is the likely etiology.  Patient will receive 1 more dose of Zosyn in the next 24 hours.  We will continue to monitor.  No signs or symptoms of current bleeding.  Chronic Problems HTN- home amlodipine, atenolol Right lower leg DVT- continue home lovenox ( anti Xas levels checked at [facility] as outpatient)   Code Status: Full Code  Family Communication: Wife at bedside  Disposition Plan: determine antibiotic( ID consulted) and steroid regimen for infection/ likely exacerbated adrenal insufficiency,    Consultants:  ID   Procedures:  None   Antimicrobials:  Acyclovir 1/21-23  Zosyn 1/20>>>  Bactrim 1/21>>>  Vancomycin 1/22   Cultures:  Blood cultures 07/15/17: NGTD  DVT prophylaxis: Lovenox   Objective: Vitals:   07/18/17 2054 07/19/17 0547 07/19/17 1055 07/19/17 1503  BP: (!) 126/95 121/85 125/73 131/85  Pulse: 60 (!) 56 (!) 54 (!) 54  Resp: 16 16  16   Temp: 98.1 F (36.7 C) 97.8 F (36.6 C)  98.2 F (36.8 C)  TempSrc: Oral Oral  Oral  SpO2: 97% 96%  96%  Weight:      Height:        Intake/Output Summary (Last 24 hours) at 07/19/2017 1515 Last data filed at 07/19/2017 0600 Gross per 24 hour  Intake 372 ml  Output -  Net 372 ml   Filed Weights   07/15/17 0413  Weight: 99.8 kg (220 lb)    Exam:  General: Lying in bed, in no apparent distres Eyes: EOMI, anicteric ENT: Oral Mucosa clear and moist Neck: normal, no appreciable JVD Cardiovascular: regular rate and rhythm, no murmurs, rubs or gallops, no edema Respiratory: Normal respiratory effort, lungs clear to auscultation bilaterally, no rales, wheezes or rhonchi Abdomen: soft, non-distended, non-tender, normal bowel sounds, no guarding or rebound tenderness Skin: No Rash Musculoskeletal:No clubbing / cyanosis. No joint deformity upper and lower extremities. Good ROM, no contractures.  Normal muscle tone Neurologic: Grossly no focal neuro deficit.Mental status AAOx3, speech normal, Psychiatric:Appropriate affect, and mood  Data Reviewed: CBC: Recent Labs  Lab 07/15/17 0342 07/16/17 0607 07/17/17 0529 07/18/17 0456 07/19/17 0511  WBC 27.8* 9.5 8.9 7.0 5.5  NEUTROABS 24.2*  --   --   --   --   HGB 15.2 10.3* 10.3* 10.3* 10.7*  HCT 45.0 31.6* 30.8* 30.8* 32.2*  MCV 96.4 96.0 96.0 95.4 93.9  PLT 407* 190 180 163 614*   Basic Metabolic Panel: Recent Labs  Lab 07/15/17 1245 07/16/17 0607 07/17/17 0529 07/17/17 2238 07/18/17 0456 07/19/17 0511  NA 140 139 140  --  140 139  K 3.5 3.0* 2.7* 3.1* 3.0* 3.3*  CL 112* 113* 109  --  109 104  CO2 23 23 25   --  27 28  GLUCOSE 153* 98 102*  --  95 107*  BUN 18 13 8   --  7 8  CREATININE 1.52* 1.10 0.95  --  0.88 0.85  CALCIUM 7.1* 7.5* 7.9*  --  7.9* 8.2*  MG  --  1.7  --  1.6*  --  1.7   GFR: Estimated Creatinine Clearance: 101.5 mL/min (by C-G formula based on SCr of 0.85 mg/dL). Liver Function Tests: Recent Labs  Lab 07/15/17 0342 07/17/17 0529  AST 41 27  ALT 44 31  ALKPHOS 138* 72  BILITOT 1.1 0.3  PROT 7.1 4.7*  ALBUMIN 3.9 2.5*   No results for input(s): LIPASE, AMYLASE in the  last 168 hours. No results for input(s): AMMONIA in the last 168 hours. Coagulation Profile: No results for input(s): INR, PROTIME in the last 168 hours. Cardiac Enzymes: No results for input(s): CKTOTAL, CKMB, CKMBINDEX, TROPONINI in the last 168 hours. BNP (last 3 results) No results for input(s): PROBNP in the last 8760 hours. HbA1C: No results for input(s): HGBA1C in the last 72 hours. CBG: Recent Labs  Lab 07/18/17 1136 07/18/17 1649 07/18/17 2049 07/19/17 0743 07/19/17 1205  GLUCAP 119* 195* 181* 82 100*   Lipid Profile: No results for input(s): CHOL, HDL, LDLCALC, TRIG, CHOLHDL, LDLDIRECT in the last 72 hours. Thyroid Function Tests: No results for input(s): TSH, T4TOTAL, FREET4, T3FREE, THYROIDAB in the  last 72 hours. Anemia Panel: No results for input(s): VITAMINB12, FOLATE, FERRITIN, TIBC, IRON, RETICCTPCT in the last 72 hours. Urine analysis:    Component Value Date/Time   COLORURINE YELLOW 07/15/2017 0629   APPEARANCEUR CLEAR 07/15/2017 0629   LABSPEC 1.009 07/15/2017 0629   PHURINE 5.0 07/15/2017 0629   GLUCOSEU NEGATIVE 07/15/2017 0629   GLUCOSEU NEGATIVE 12/23/2009 0742   HGBUR NEGATIVE 07/15/2017 0629   HGBUR negative 01/24/2007 0810   BILIRUBINUR NEGATIVE 07/15/2017 0629   BILIRUBINUR negative 08/10/2014 1057   BILIRUBINUR n 02/11/2013 1059   KETONESUR NEGATIVE 07/15/2017 0629   PROTEINUR NEGATIVE 07/15/2017 0629   UROBILINOGEN 0.2 08/10/2014 1057   UROBILINOGEN 0.2 12/23/2009 0742   NITRITE NEGATIVE 07/15/2017 0629   LEUKOCYTESUR NEGATIVE 07/15/2017 0629   Sepsis Labs: @LABRCNTIP (procalcitonin:4,lacticidven:4)  ) Recent Results (from the past 240 hour(s))  Blood culture (routine x 2)     Status: None (Preliminary result)   Collection Time: 07/15/17  3:42 AM  Result Value Ref Range Status   Specimen Description BLOOD LEFT ANTECUBITAL  Final   Special Requests   Final    BOTTLES DRAWN AEROBIC AND ANAEROBIC Blood Culture adequate volume   Culture   Final    NO GROWTH 4 DAYS Performed at [facility], [place] [address] [place], [place] [address]    Report Status PENDING  Incomplete  Blood culture (routine x 2)     Status: None (Preliminary result)   Collection Time: 07/15/17  3:42 AM  Result Value Ref Range Status   Specimen Description BLOOD RIGHT ANTECUBITAL  Final   Special Requests   Final    BOTTLES DRAWN AEROBIC AND ANAEROBIC Blood Culture adequate volume   Culture   Final    NO GROWTH 4 DAYS Performed at [facility], [place] [address], [place], [place] [address]    Report Status PENDING  Incomplete  C difficile quick scan w PCR reflex     Status: None   Collection Time: 07/15/17  6:48 PM  Result Value Ref Range Status   C Diff antigen  NEGATIVE NEGATIVE Final   C Diff toxin NEGATIVE NEGATIVE Final   C Diff interpretation No C. difficile detected.  Final  Gastrointestinal Panel by PCR , Stool     Status: None   Collection Time: 07/16/17  3:57 PM  Result Value Ref Range Status   Campylobacter species NOT DETECTED NOT DETECTED Final   Plesimonas shigelloides NOT DETECTED NOT DETECTED Final   Salmonella species NOT DETECTED NOT DETECTED Final   Yersinia enterocolitica NOT DETECTED NOT DETECTED Final   Vibrio species NOT DETECTED NOT DETECTED Final   Vibrio cholerae NOT DETECTED NOT DETECTED Final   Enteroaggregative E coli (EAEC) NOT DETECTED NOT DETECTED Final   Enteropathogenic E coli (EPEC)  NOT DETECTED NOT DETECTED Final   Enterotoxigenic E coli (ETEC) NOT DETECTED NOT DETECTED Final   Shiga like toxin producing E coli (STEC) NOT DETECTED NOT DETECTED Final   Shigella/Enteroinvasive E coli (EIEC) NOT DETECTED NOT DETECTED Final   Cryptosporidium NOT DETECTED NOT DETECTED Final   Cyclospora cayetanensis NOT DETECTED NOT DETECTED Final   Entamoeba histolytica NOT DETECTED NOT DETECTED Final   Giardia lamblia NOT DETECTED NOT DETECTED Final   Adenovirus F40/41 NOT DETECTED NOT DETECTED Final   Astrovirus NOT DETECTED NOT DETECTED Final   Norovirus GI/GII NOT DETECTED NOT DETECTED Final   Rotavirus A NOT DETECTED NOT DETECTED Final   Sapovirus (I, II, IV, and V) NOT DETECTED NOT DETECTED Final    Comment: Performed at [facility], [address] [place], [place] [address]  MRSA PCR Screening     Status: None   Collection Time: 07/16/17  3:57 PM  Result Value Ref Range Status   MRSA by PCR NEGATIVE NEGATIVE Final    Comment:        The GeneXpert MRSA Assay (FDA approved for NASAL specimens only), is one component of a comprehensive MRSA colonization surveillance program. It is not intended to diagnose MRSA infection nor to guide or monitor treatment for MRSA infections.       Studies: No results  found.  Scheduled Meds: . acyclovir  400 mg Oral BID  . amLODipine  10 mg Oral QHS  . atenolol  25 mg Oral Daily  . cholecalciferol  1,000 Units Oral Daily  . enoxaparin  80 mg Subcutaneous Q12H  . fluticasone  2 spray Each Nare QHS  . folic acid  1 mg Oral Daily  . hydrocortisone  30 mg Oral QPM  . hydrocortisone  50 mg Oral q morning - 10a  . insulin aspart  0-9 Units Subcutaneous TID WC  . insulin detemir  5 Units Subcutaneous QHS  . magnesium oxide  400 mg Oral 5 X Daily  . omeprazole  40 mg Oral BID AC  . posaconazole  300 mg Oral Daily  . senna  1 tablet Oral BID  . senna-docusate  1 tablet Oral QHS  . sodium chloride flush  3 mL Intravenous Q12H  . sulfamethoxazole-trimethoprim  1 tablet Oral Daily  . tacrolimus  0.5 mg Oral QPC breakfast    Continuous Infusions: . sodium chloride    . piperacillin-tazobactam (ZOSYN)  IV 3.375 g (07/19/17 1100)     LOS: 4 days     Desiree Hane, MD [facility] Pager [phone]  If 7PM-7AM, please contact night-coverage [email or web address] Password [facility] 07/19/2017, 3:15 PM

## 2017-07-19 NOTE — Progress Notes (Signed)
Pharmacy Antibiotic Note  Brandon [name] is a 67 y.o. male with PMH MDS s/p bone marrow transplant 14 months ago, DVT, recently admitted to the hospital from 1/13-1/15 for febrile illness initially treated with Vancomycin and cefepime. However, cultures remained negative and patient sent home on 4-day course of Levaquin. Today returns on 1/21 with fever and tachypnea; CXR unremarkable. Pharmacy has been consulted for vancomycin and Zosyn dosing for sepsis.  Today, 07/19/2017: - day #4 Zosyn (Vancomycin dc'd 1/22) - afeb, wbc wnl - scr down 0.85 (crcl~100)   Plan: - continue Zosyn 3.375 g IV q8h over 4 hrs - no further dose adjustments needed so pharmacy will sign-off writing but will continue to follow with [place] bedside rounding - ID consulted ___________________________________  Height: 5\' 11"  (180.3 cm) Weight: 220 lb (99.8 kg) IBW/kg (Calculated) : 75.3  Temp (24hrs), Avg:98 F (36.7 C), Min:97.8 F (36.6 C), Max:98.1 F (36.7 C)  Recent Labs  Lab 07/15/17 0342 07/15/17 0400 07/15/17 0530 07/15/17 1245 07/16/17 0607 07/16/17 1531 07/17/17 0529 07/18/17 0456 07/19/17 0511  WBC 27.8*  --   --   --  9.5  --  8.9 7.0 5.5  CREATININE 1.60*  --   --  1.52* 1.10  --  0.95 0.88 0.85  LATICACIDVEN  --  5.36* 2.31*  --   --  2.3* 1.1  --   --     Estimated Creatinine Clearance: 101.5 mL/min (by C-G formula based on SCr of 0.85 mg/dL).    No Known Allergies  Antimicrobials this admission: PTA acyclovir, bactrim, posaconazole resumed - several interactions with posaconaole - DDI with prograf should be stable.  PPI and posaconazole - can dec posaconazole levels 1/21 vanc >> 1/22 1/21 Zosyn >>   Dose adjustments this admission: 1/22: change vanc to 750 mg q12h  Microbiology results: 1/13 GI panel: neg 1/21 BCx x2: NG 1/21 cdiff pcr: neg FINAL 1/22 GI panel: neg  Thank you for allowing pharmacy to be a part of this patient's care.  Peggyann Juba, PharmD,  BCPS Pager: [phone] 07/19/2017 7:39 AM

## 2017-07-19 NOTE — Plan of Care (Signed)
  Health Behavior/Discharge Planning: Ability to manage health-related needs will improve 07/19/2017 2046 - Progressing by Mallory Schaad, Sherryll Burger, RN

## 2017-07-20 LAB — GLUCOSE, CAPILLARY
Glucose-Capillary: 94 mg/dL (ref 65–99)
Glucose-Capillary: 102 mg/dL — ABNORMAL HIGH (ref 65–99)
Glucose-Capillary: 183 mg/dL — ABNORMAL HIGH (ref 65–99)
Glucose-Capillary: 139 mg/dL — ABNORMAL HIGH (ref 65–99)

## 2017-07-20 LAB — BASIC METABOLIC PANEL
ANION GAP: 8 (ref 5–15)
BUN: 11 mg/dL (ref 6–20)
CO2: 26 mmol/L (ref 22–32)
Calcium: 8.6 mg/dL — ABNORMAL LOW (ref 8.9–10.3)
Chloride: 105 mmol/L (ref 101–111)
Creatinine, Ser: 0.96 mg/dL (ref 0.61–1.24)
GFR calc non Af Amer: >60 mL/min (ref >60)
Glucose, Bld: 136 mg/dL — ABNORMAL HIGH (ref 65–99)
POTASSIUM: 3.2 mmol/L — AB (ref 3.5–5.1)
SODIUM: 139 mmol/L (ref 135–145)

## 2017-07-20 LAB — CULTURE, BLOOD (ROUTINE X 2)
CULTURE: NO GROWTH
Culture: NO GROWTH
SPECIAL REQUESTS: ADEQUATE
Special Requests: ADEQUATE

## 2017-07-20 LAB — CBC
HEMATOCRIT: 36.3 % — AB (ref 39.0–52.0)
HEMOGLOBIN: 12 g/dL — AB (ref 13.0–17.0)
MCH: 31.4 pg (ref 26.0–34.0)
MCHC: 33.1 g/dL (ref 30.0–36.0)
MCV: 95 fL (ref 78.0–100.0)
PLATELETS: 189 10*3/uL (ref 150–400)
RBC: 3.82 MIL/uL — AB (ref 4.22–5.81)
RDW: 15.4 % (ref 11.5–15.5)
WBC: 7.2 10*3/uL (ref 4.0–10.5)

## 2017-07-20 NOTE — Progress Notes (Signed)
PROGRESS NOTE  Brandon [name] UKG:[id number] DOB: [birth year] DOA: 07/15/2017 PCP: [name], MD  HPI/Recap of past 24 hours:  Brandon [name] is a 67 y.o. year old male with medical history significant priorof Rt LE DVT(01/2017), S/p Bone Marrow Transplant/Stem Cell transplantin October 2017with prior h/oGVHDfor MDS who ischronically on Prograf and steroids  who presented on 07/15/2017 with chills and subjective fevers with weakness after recent discharge for similar symptoms on 1/13-1/15/19 and was found to meet SIRS criteria with concern for either infection and/or adrenal insufficency.    Formed stool. No abdominal pain. No fevers or chills.  Assessment/Plan: Principal Problem:   Sepsis ([place]) Active Problems:   Essential hypertension   Steroid-induced hyperglycemia   MDS (myelodysplastic syndrome) (HCC)   History of stem cell transplant ([place])   Deep vein thrombosis (DVT) of femoral vein of right lower extremity (HCC)   GVHD (graft versus host disease) (HCC)   SIRS (systemic inflammatory response syndrome) (HCC)    Sepsis (tachypnea, lactic acidosis, leukocytosis) in immunocompromised patient, resolved.  Unclear etiology though currently on empiric Zosyn therapy for presumed GI source given persistent diarrhea.  Workup so far CT abdomen negative, blood cultures negative the patient's sepsis has resolved.  Zosyn.  ID consulted and suspect some symptomatology may be secondary to crisis in setting of infectious presentation. Last dose of zosyn today then will monitor off abx over 24 hours with plan to discharge on 1/27.   Worsening nausea, generalized malaise, concern for likely adrenal insufficiency,resolved.  Patient chronically on hydrocortisone and prednisone.  After recent hospitalization prednisone taper may have been to decreased too soon (prednisone 30 mg for 3 days then to 10 mg) and caused current flare.  Plan to discharge on slow hydrocortisone taper.  Oncologist  recommended increase hydrocortisone to 50 mg a.m. and 30 mg p.m.  Will discuss with oncologist plan for taper prior to discharge on 1/27.  Hypokalemia and hypomagnesemia, stable.  Chronic hypomagnesium related to tacrolimus therapy. Will supplement both mag and potassium and continue home oral mag regimen. Closely monitor  AKI,prerenal, resolved.  Returned to baseline with fluid resuscitation.   S/p Bone Marrow Transplant/Stem Celltransplant withprior H/oGVHD Prograf level returned at 2.7.  Discussed with patient's oncologist Dr. Fanny Skates on 1/23 who recommended to continue current tacrolimus dose of 0.5mg  qd and prophylaxis meds: acyclovir, posaconazole and bactrim.   Steroid-induced hyperglycemia, relative hypoglycemia, resolved.  5 U levemir, correction scale as needed with meals and continue to closely monitor blood glucose.  Bradycardia, patient states this is chronic even on his home atenolol. After med rec we have continued him on his home dose of atenolol. Continue to monitor. Remains asymptomatic.  Loose stool, resolved  Chronic Problems HTN- home amlodipine, atenolol Right lower leg DVT- continue home lovenox ( anti Xas levels checked at [facility] as outpatient)   Code Status: Full Code  Family Communication: Wife at bedside  Disposition Plan: monitor off zosyn, plan for d/c on 1/27 if remains clinically stable  Consultants:  ID   Procedures:  None   Antimicrobials:  Acyclovir 1/21-23  Zosyn 1/20>>>1/26  Bactrim 1/21>>>  Vancomycin 1/22   Cultures:  Blood cultures 07/15/17: NGTD  DVT prophylaxis: Lovenox   Objective: Vitals:   07/19/17 1503 07/19/17 2229 07/20/17 0557 07/20/17 1415  BP: 131/85 129/77 126/83 117/72  Pulse: (!) 54 (!) 52 (!) 51 (!) 53  Resp: 16 18 17 18   Temp: 98.2 F (36.8 C) 98 F (36.7 C) 97.8 F (36.6 C) 98  F (36.7 C)  TempSrc: Oral Oral Oral Oral  SpO2: 96% 97% 96% 96%  Weight:      Height:        Intake/Output  Summary (Last 24 hours) at 07/20/2017 1534 Last data filed at 07/20/2017 1417 Gross per 24 hour  Intake 1040 ml  Output 5 ml  Net 1035 ml   Filed Weights   07/15/17 0413  Weight: 99.8 kg (220 lb)    Exam:  General: Lying in bed, in no apparent distress ENT: Oral Mucosa clear and moist Cardiovascular: regular rate and rhythm, no murmurs, rubs or gallops, no edema Respiratory: Normal respiratory effort, lungs clear to auscultation bilaterally, no rales, wheezes or rhonchi Abdomen: soft, non-distended, non-tender, normal bowel sounds, no guarding or rebound tenderness Skin: No Rash Neurologic: Grossly no focal neuro deficit.Mental status AAOx3, speech normal, Psychiatric:Appropriate affect, and mood  Data Reviewed: CBC: Recent Labs  Lab 07/15/17 0342 07/16/17 0607 07/17/17 0529 07/18/17 0456 07/19/17 0511 07/20/17 0847  WBC 27.8* 9.5 8.9 7.0 5.5 7.2  NEUTROABS 24.2*  --   --   --   --   --   HGB 15.2 10.3* 10.3* 10.3* 10.7* 12.0*  HCT 45.0 31.6* 30.8* 30.8* 32.2* 36.3*  MCV 96.4 96.0 96.0 95.4 93.9 95.0  PLT 407* 190 180 163 123* 403   Basic Metabolic Panel: Recent Labs  Lab 07/16/17 0607 07/17/17 0529 07/17/17 2238 07/18/17 0456 07/19/17 0511 07/20/17 0847  NA 139 140  --  140 139 139  K 3.0* 2.7* 3.1* 3.0* 3.3* 3.2*  CL 113* 109  --  109 104 105  CO2 23 25  --  27 28 26   GLUCOSE 98 102*  --  95 107* 136*  BUN 13 8  --  7 8 11   CREATININE 1.10 0.95  --  0.88 0.85 0.96  CALCIUM 7.5* 7.9*  --  7.9* 8.2* 8.6*  MG 1.7  --  1.6*  --  1.7  --    GFR: Estimated Creatinine Clearance: 89.9 mL/min (by C-G formula based on SCr of 0.96 mg/dL). Liver Function Tests: Recent Labs  Lab 07/15/17 0342 07/17/17 0529  AST 41 27  ALT 44 31  ALKPHOS 138* 72  BILITOT 1.1 0.3  PROT 7.1 4.7*  ALBUMIN 3.9 2.5*   No results for input(s): LIPASE, AMYLASE in the last 168 hours. No results for input(s): AMMONIA in the last 168 hours. Coagulation Profile: No results for  input(s): INR, PROTIME in the last 168 hours. Cardiac Enzymes: No results for input(s): CKTOTAL, CKMB, CKMBINDEX, TROPONINI in the last 168 hours. BNP (last 3 results) No results for input(s): PROBNP in the last 8760 hours. HbA1C: No results for input(s): HGBA1C in the last 72 hours. CBG: Recent Labs  Lab 07/19/17 1205 07/19/17 1622 07/19/17 2219 07/20/17 0732 07/20/17 1138  GLUCAP 100* 190* 136* 94 102*   Lipid Profile: No results for input(s): CHOL, HDL, LDLCALC, TRIG, CHOLHDL, LDLDIRECT in the last 72 hours. Thyroid Function Tests: No results for input(s): TSH, T4TOTAL, FREET4, T3FREE, THYROIDAB in the last 72 hours. Anemia Panel: No results for input(s): VITAMINB12, FOLATE, FERRITIN, TIBC, IRON, RETICCTPCT in the last 72 hours. Urine analysis:    Component Value Date/Time   COLORURINE YELLOW 07/15/2017 0629   APPEARANCEUR CLEAR 07/15/2017 0629   LABSPEC 1.009 07/15/2017 0629   PHURINE 5.0 07/15/2017 0629   GLUCOSEU NEGATIVE 07/15/2017 0629   GLUCOSEU NEGATIVE 12/23/2009 0742   HGBUR NEGATIVE 07/15/2017 0629   HGBUR negative 01/24/2007 0810  BILIRUBINUR NEGATIVE 07/15/2017 0629   BILIRUBINUR negative 08/10/2014 1057   BILIRUBINUR n 02/11/2013 1059   KETONESUR NEGATIVE 07/15/2017 0629   PROTEINUR NEGATIVE 07/15/2017 0629   UROBILINOGEN 0.2 08/10/2014 1057   UROBILINOGEN 0.2 12/23/2009 0742   NITRITE NEGATIVE 07/15/2017 0629   LEUKOCYTESUR NEGATIVE 07/15/2017 0629   Sepsis Labs: @LABRCNTIP (procalcitonin:4,lacticidven:4)  ) Recent Results (from the past 240 hour(s))  Blood culture (routine x 2)     Status: None   Collection Time: 07/15/17  3:42 AM  Result Value Ref Range Status   Specimen Description BLOOD LEFT ANTECUBITAL  Final   Special Requests   Final    BOTTLES DRAWN AEROBIC AND ANAEROBIC Blood Culture adequate volume   Culture   Final    NO GROWTH 5 DAYS Performed at [facility], [place] [address] [place], [place] [address]    Report Status  07/20/2017 FINAL  Final  Blood culture (routine x 2)     Status: None   Collection Time: 07/15/17  3:42 AM  Result Value Ref Range Status   Specimen Description BLOOD RIGHT ANTECUBITAL  Final   Special Requests   Final    BOTTLES DRAWN AEROBIC AND ANAEROBIC Blood Culture adequate volume   Culture   Final    NO GROWTH 5 DAYS Performed at [facility], [place] [address], [place], [place] [address]    Report Status 07/20/2017 FINAL  Final  C difficile quick scan w PCR reflex     Status: None   Collection Time: 07/15/17  6:48 PM  Result Value Ref Range Status   C Diff antigen NEGATIVE NEGATIVE Final   C Diff toxin NEGATIVE NEGATIVE Final   C Diff interpretation No C. difficile detected.  Final  Gastrointestinal Panel by PCR , Stool     Status: None   Collection Time: 07/16/17  3:57 PM  Result Value Ref Range Status   Campylobacter species NOT DETECTED NOT DETECTED Final   Plesimonas shigelloides NOT DETECTED NOT DETECTED Final   Salmonella species NOT DETECTED NOT DETECTED Final   Yersinia enterocolitica NOT DETECTED NOT DETECTED Final   Vibrio species NOT DETECTED NOT DETECTED Final   Vibrio cholerae NOT DETECTED NOT DETECTED Final   Enteroaggregative E coli (EAEC) NOT DETECTED NOT DETECTED Final   Enteropathogenic E coli (EPEC) NOT DETECTED NOT DETECTED Final   Enterotoxigenic E coli (ETEC) NOT DETECTED NOT DETECTED Final   Shiga like toxin producing E coli (STEC) NOT DETECTED NOT DETECTED Final   Shigella/Enteroinvasive E coli (EIEC) NOT DETECTED NOT DETECTED Final   Cryptosporidium NOT DETECTED NOT DETECTED Final   Cyclospora cayetanensis NOT DETECTED NOT DETECTED Final   Entamoeba histolytica NOT DETECTED NOT DETECTED Final   Giardia lamblia NOT DETECTED NOT DETECTED Final   Adenovirus F40/41 NOT DETECTED NOT DETECTED Final   Astrovirus NOT DETECTED NOT DETECTED Final   Norovirus GI/GII NOT DETECTED NOT DETECTED Final   Rotavirus A NOT DETECTED NOT DETECTED Final    Sapovirus (I, II, IV, and V) NOT DETECTED NOT DETECTED Final    Comment: Performed at [facility], [address] [place], [place] [address]  MRSA PCR Screening     Status: None   Collection Time: 07/16/17  3:57 PM  Result Value Ref Range Status   MRSA by PCR NEGATIVE NEGATIVE Final    Comment:        The GeneXpert MRSA Assay (FDA approved for NASAL specimens only), is one component of a comprehensive MRSA colonization  surveillance program. It is not intended to diagnose MRSA infection nor to guide or monitor treatment for MRSA infections.       Studies: No results found.  Scheduled Meds: . acyclovir  400 mg Oral BID  . amLODipine  10 mg Oral QHS  . atenolol  25 mg Oral Daily  . cholecalciferol  1,000 Units Oral Daily  . enoxaparin  80 mg Subcutaneous Q12H  . fluticasone  2 spray Each Nare QHS  . folic acid  1 mg Oral Daily  . hydrocortisone  30 mg Oral QPM  . hydrocortisone  50 mg Oral q morning - 10a  . insulin aspart  0-9 Units Subcutaneous TID WC  . insulin detemir  5 Units Subcutaneous QHS  . magnesium oxide  400 mg Oral 5 X Daily  . omeprazole  40 mg Oral BID AC  . posaconazole  300 mg Oral Daily  . senna  1 tablet Oral BID  . senna-docusate  1 tablet Oral QHS  . sodium chloride flush  3 mL Intravenous Q12H  . sulfamethoxazole-trimethoprim  1 tablet Oral Daily  . tacrolimus  0.5 mg Oral QPC breakfast    Continuous Infusions: . sodium chloride    . piperacillin-tazobactam (ZOSYN)  IV Stopped (07/20/17 1236)     LOS: 5 days     Desiree Hane, MD [facility] Pager [phone]  If 7PM-7AM, please contact night-coverage [email or web address] Password [email or web address] 07/20/2017, 3:34 PM

## 2017-07-20 NOTE — Plan of Care (Signed)
  Nutrition: Adequate nutrition will be maintained 07/20/2017 2212 - Progressing by Ashley Murrain, RN

## 2017-07-21 LAB — CBC
HCT: 38.3 % — ABNORMAL LOW (ref 39.0–52.0)
HEMOGLOBIN: 12.5 g/dL — AB (ref 13.0–17.0)
MCH: 31.2 pg (ref 26.0–34.0)
MCHC: 32.6 g/dL (ref 30.0–36.0)
MCV: 95.5 fL (ref 78.0–100.0)
Platelets: 200 10*3/uL (ref 150–400)
RBC: 4.01 MIL/uL — ABNORMAL LOW (ref 4.22–5.81)
RDW: 15.4 % (ref 11.5–15.5)
WBC: 7.1 10*3/uL (ref 4.0–10.5)

## 2017-07-21 LAB — BASIC METABOLIC PANEL
Anion gap: 7 (ref 5–15)
BUN: 10 mg/dL (ref 6–20)
CHLORIDE: 106 mmol/L (ref 101–111)
CO2: 26 mmol/L (ref 22–32)
CREATININE: 0.91 mg/dL (ref 0.61–1.24)
Calcium: 8.6 mg/dL — ABNORMAL LOW (ref 8.9–10.3)
GFR calc Af Amer: >60 mL/min (ref >60)
GFR calc non Af Amer: >60 mL/min (ref >60)
GLUCOSE: 87 mg/dL (ref 65–99)
POTASSIUM: 3.1 mmol/L — AB (ref 3.5–5.1)
SODIUM: 139 mmol/L (ref 135–145)

## 2017-07-21 LAB — GLUCOSE, CAPILLARY
GLUCOSE-CAPILLARY: 85 mg/dL (ref 65–99)
Glucose-Capillary: 110 mg/dL — ABNORMAL HIGH (ref 65–99)

## 2017-07-21 MED ORDER — POTASSIUM CHLORIDE 20 MEQ/15ML (10%) PO SOLN
40.0000 meq | Freq: Once | ORAL | Status: DC
  Filled 2017-07-21 (×2): qty 30

## 2017-07-21 MED ORDER — POTASSIUM CHLORIDE CRYS ER 20 MEQ PO TBCR: 40.0000 meq | EXTENDED_RELEASE_TABLET | Freq: Once | ORAL | Status: DC

## 2017-07-21 MED ORDER — HYDROCORTISONE 10 MG PO TABS: ORAL_TABLET | ORAL | 1 refills | Status: DC

## 2017-07-21 MED ORDER — ATENOLOL 50 MG PO TABS: 25.0000 mg | ORAL_TABLET | Freq: Every evening | ORAL | Status: DC

## 2017-07-21 MED ORDER — POTASSIUM CHLORIDE CRYS ER 20 MEQ PO TBCR
40.0000 meq | EXTENDED_RELEASE_TABLET | Freq: Once | ORAL | Status: AC
  Administered 2017-07-21: 40 meq via ORAL
  Filled 2017-07-21: qty 2

## 2017-07-21 MED ORDER — METRONIDAZOLE 500 MG PO TABS: 500.0000 mg | ORAL_TABLET | Freq: Three times a day (TID) | ORAL | 0 refills | Status: AC

## 2017-07-21 MED ORDER — CEFPODOXIME PROXETIL 200 MG PO TABS: 200.0000 mg | ORAL_TABLET | Freq: Two times a day (BID) | ORAL | 0 refills | Status: AC

## 2017-07-21 MED ORDER — ONDANSETRON HCL 4 MG PO TABS: 4.0000 mg | ORAL_TABLET | Freq: Three times a day (TID) | ORAL | 1 refills | Status: AC | PRN

## 2017-07-21 MED ORDER — INSULIN DETEMIR 100 UNIT/ML ~~LOC~~ SOLN: SUBCUTANEOUS | 11 refills | Status: DC

## 2017-07-21 MED ORDER — METRONIDAZOLE 500 MG PO TABS: 500.0000 mg | ORAL_TABLET | Freq: Three times a day (TID) | ORAL | 0 refills | Status: DC

## 2017-07-22 ENCOUNTER — Telehealth: Payer: Self-pay | Admitting: *Deleted

## 2017-07-22 ENCOUNTER — Other Ambulatory Visit: Payer: Self-pay

## 2017-07-22 NOTE — Telephone Encounter (Signed)
Per chart Review:  Admit date: 07/15/2017 Discharge date: 07/21/2017  Time spent: < 25 minutes  Admitted From: Home Disposition:  Home  Recommendations for Outpatient Follow-up:  1. Follow up with PCP in 1-2 weeks. Pt will make appt for lab draw on 1/29. Will see oncologist on 07/26/17 2. Please obtain BMP(Mg, K), CBC(wbc) 3. Continue 7 day course of Flagyl and Cefpodoxime 4. Hydrocortisone taper 50mg  am/30 mg pm thru 1/28, followed by 40 mg am/30 mg pm starting 1/29 until seen by oncologist at [facility] Dr. Stefani Dama 5. Levemir decreased to 5 U, follow up blood glucose monitoring  Home Health:NO Equipment/Devices:None  ________________________________________________________________________ Per telephone call: Transition Care Management Follow-up Telephone Call   Date discharged? 07/21/17   How have you been since you were released from the hospital? "good"   Do you understand why you were in the hospital? yes   Do you understand the discharge instructions? yes   Where were you discharged to? Home   Items Reviewed:  Medications reviewed: yes  Allergies reviewed: yes  Dietary changes reviewed: yes  Referrals reviewed: yes   Functional Questionnaire:   Activities of Daily Living (ADLs):   He states they are independent in the following: ambulation States they require assistance with the following: none   Any transportation issues/concerns?: no   Any patient concerns? no   Confirmed importance and date/time of follow-up visits scheduled Yes. Patient has follow up labs scheduled at the [facility] for tomorrow morning. He states that if Dr Yong Channel could review them and add on any additional labs he sees necessary. He also states he has an appointment on Friday at the [facility]. He states that he will come in to see Dr Yong Channel if that is what Dr Yong Channel would like. I explained that Dr Yong Channel is out of the office today but if Dr Yong Channel would like  him to come in for a visit then I would give him a call back. Patient stated understanding.  Confirmed with patient if condition begins to worsen call PCP or go to the ER.  Patient was given the office number and encouraged to call back with question or concerns.  : yes

## 2017-07-22 NOTE — Telephone Encounter (Signed)
Once again Brandon [name] is in great hands with [facility] and local oncology. If he would prefer to stay out of office during cold and flu season and feels confident with this follow up- I am ok with him not coming in for visit with [place].

## 2017-07-23 ENCOUNTER — Inpatient Hospital Stay: Payer: Medicare Other | Attending: Hematology and Oncology

## 2017-07-23 DIAGNOSIS — D4621 Refractory anemia with excess of blasts 1: Secondary | ICD-10-CM | POA: Diagnosis not present

## 2017-07-23 DIAGNOSIS — D469 Myelodysplastic syndrome, unspecified: Secondary | ICD-10-CM

## 2017-07-23 LAB — CBC WITH DIFFERENTIAL/PLATELET
BASOS ABS: 0 10*3/uL (ref 0.0–0.1)
BASOS PCT: 0 %
EOS PCT: 2 %
Eosinophils Absolute: 0.1 10*3/uL (ref 0.0–0.5)
HCT: 37.6 % — ABNORMAL LOW (ref 38.4–49.9)
Hemoglobin: 12.4 g/dL — ABNORMAL LOW (ref 13.0–17.1)
Lymphocytes Relative: 27 %
Lymphs Abs: 1.6 10*3/uL (ref 0.9–3.3)
MCH: 31.6 pg (ref 27.2–33.4)
MCHC: 33 g/dL (ref 32.0–36.0)
MCV: 95.8 fL (ref 79.3–98.0)
MONO ABS: 0.7 10*3/uL (ref 0.1–0.9)
MONOS PCT: 12 %
NEUTROS ABS: 3.5 10*3/uL (ref 1.5–6.5)
Neutrophils Relative %: 59 %
PLATELETS: 221 10*3/uL (ref 140–400)
RBC: 3.92 MIL/uL — ABNORMAL LOW (ref 4.20–5.82)
RDW: 16 % — AB (ref 11.0–15.6)
WBC: 5.9 10*3/uL (ref 4.0–10.3)

## 2017-07-23 LAB — MAGNESIUM: MAGNESIUM: 1.8 mg/dL (ref 1.5–2.5)

## 2017-07-23 LAB — COMPREHENSIVE METABOLIC PANEL
ALBUMIN: 3 g/dL — AB (ref 3.5–5.0)
ALT: 41 U/L (ref 0–55)
ANION GAP: 7 (ref 3–11)
AST: 19 U/L (ref 5–34)
Alkaline Phosphatase: 125 U/L (ref 40–150)
BUN: 15 mg/dL (ref 7–26)
CHLORIDE: 108 mmol/L (ref 98–109)
CO2: 25 mmol/L (ref 22–29)
Calcium: 8.6 mg/dL (ref 8.4–10.4)
Creatinine, Ser: 1.03 mg/dL (ref 0.70–1.30)
GFR calc Af Amer: >60 mL/min (ref >60)
GLUCOSE: 85 mg/dL (ref 70–140)
POTASSIUM: 3.8 mmol/L (ref 3.5–5.1)
Sodium: 140 mmol/L (ref 136–145)
TOTAL PROTEIN: 5.5 g/dL — AB (ref 6.4–8.3)
Total Bilirubin: 0.4 mg/dL (ref 0.2–1.2)

## 2017-07-25 LAB — TACROLIMUS LEVEL: TACROLIMUS (FK506) - LABCORP: 2.6 ng/mL (ref 2.0–20.0)

## 2017-07-29 ENCOUNTER — Encounter: Payer: Self-pay | Admitting: Family Medicine

## 2017-08-01 ENCOUNTER — Encounter: Payer: Self-pay | Admitting: Hematology and Oncology

## 2017-08-06 ENCOUNTER — Encounter: Payer: Self-pay | Admitting: Family Medicine

## 2017-08-06 ENCOUNTER — Ambulatory Visit (INDEPENDENT_AMBULATORY_CARE_PROVIDER_SITE_OTHER): Payer: Medicare Other | Admitting: Family Medicine

## 2017-08-06 DIAGNOSIS — E2740 Unspecified adrenocortical insufficiency: Secondary | ICD-10-CM

## 2017-08-06 DIAGNOSIS — I82411 Acute embolism and thrombosis of right femoral vein: Secondary | ICD-10-CM

## 2017-08-06 DIAGNOSIS — R6510 Systemic inflammatory response syndrome (SIRS) of non-infectious origin without acute organ dysfunction: Secondary | ICD-10-CM

## 2017-08-06 DIAGNOSIS — G629 Polyneuropathy, unspecified: Secondary | ICD-10-CM | POA: Diagnosis not present

## 2017-08-06 DIAGNOSIS — I10 Essential (primary) hypertension: Secondary | ICD-10-CM

## 2017-08-06 DIAGNOSIS — E785 Hyperlipidemia, unspecified: Secondary | ICD-10-CM

## 2017-08-06 DIAGNOSIS — H04129 Dry eye syndrome of unspecified lacrimal gland: Secondary | ICD-10-CM | POA: Insufficient documentation

## 2017-08-06 HISTORY — DX: Unspecified adrenocortical insufficiency: E27.40

## 2017-08-06 NOTE — Assessment & Plan Note (Signed)
From notes does not appear these hospitalizations were  clearly sepsis so removed these diagnosis from problem list. Possibly sepsis/sirs vs. Purely adrenal insufficiency- but with that once again not fully clear what trigger is other than potentially stepping down on prednisone.

## 2017-08-06 NOTE — Assessment & Plan Note (Signed)
Remains on lovenox- plan is at least a year from onset- potentially longer. Patient states these injections are tougher for him than his insulin.

## 2017-08-06 NOTE — Patient Instructions (Signed)
No changes today  Great to see you!

## 2017-08-06 NOTE — Assessment & Plan Note (Addendum)
S: Patient with 2 hospitalizations in January 2019- treated with fluids, antibiotics, steroids- ultimately thought most likely due to adrenal insufficiency with no obvious source of infection found. Seemed to be triggered by stepping down prednisone. He is now off prednisone and on hydrocortisone 30mg  AM, 30 mg PM. He states his edema in his legs is down some after hydrocortisone reduced slightly at [facility] A/P: Patient has been referred to [facility] endocrinology for further evaluation/management- I told patient I thought this was an excellent idea

## 2017-08-06 NOTE — Assessment & Plan Note (Signed)
S: Patient has seen a world renowned ophthalmologist  from [place] Dr. Sharen Counter per his report- dry eyes thought to be post chemotherapy related.  A/P: Patient will continue prn artificial tears- apparently this will be a long term issue most likely

## 2017-08-06 NOTE — Assessment & Plan Note (Signed)
S: controlled on Atenolol 12.5mg , amlodipine 10mg  BP Readings from Last 3 Encounters:  08/06/17 124/80  07/21/17 127/84  07/09/17 117/77  A/P: We discussed blood pressure goal of <140/90. Continue current meds

## 2017-08-06 NOTE — Assessment & Plan Note (Signed)
S: suspect poorly controlled on no statin. He was on Simvastatin 40mg  before MDS with excess blasts requiring bone marrow transplant Lab Results  Component Value Date   CHOL 146 08/10/2014   HDL 43.10 08/10/2014   LDLCALC 79 08/10/2014   LDLDIRECT 92.0 05/04/2015   TRIG 122.0 08/10/2014   CHOLHDL 3 08/10/2014   A/P: patient asks about restarting statin. We discussed at the point where [facility] clears him to get immunizations again that we would begin to consider statin again- for now want him to hold off with complexity of illness and desire to not further complicate with medication additions

## 2017-08-06 NOTE — Progress Notes (Signed)
Subjective:  Brandon [name] is a 67 y.o. year old very pleasant male patient who presents for/with See problem oriented charting ROS- he has some lingering fatigue. Occasional very mild nausea. No vomiting. No fever or chills.  Shakiness has improved on lower tacrolimus  Past Medical History-  Patient Active Problem List   Diagnosis Date Noted  . Adrenocortical insufficiency ([place]) 08/06/2017    Priority: High  . GVHD (graft versus host disease) ([place]) 03/04/2017    Priority: High  . MDS (myelodysplastic syndrome) ([place]) 12/06/2015    Priority: High  . Steroid-induced hyperglycemia 08/16/2014    Priority: High  . Post chemotherapy Dry eyes due to decreased tear production 08/06/2017    Priority: Medium  . post chemotherapy Peripheral neuropathy 08/06/2017    Priority: Medium  . BPH associated with nocturia 11/15/2014    Priority: Medium  . Hyperlipidemia 02/04/2007    Priority: Medium  . Essential hypertension 02/04/2007    Priority: Medium  . History of stem cell transplant ([place]) 03/04/2017    Priority: Low  . Deep vein thrombosis (DVT) of femoral vein of right lower extremity ([place]) 03/04/2017    Priority: Low  . Leukopenia 11/15/2014    Priority: Low  . Chest pain 08/13/2014    Priority: Low  . GERD (gastroesophageal reflux disease) 05/03/2014    Priority: Low  . Cervical disc disorder with radiculopathy of cervical region 07/14/2010    Priority: Low  . MIXED HEARING LOSS BILATERAL 07/14/2010    Priority: Low  . ERECTILE DYSFUNCTION 02/07/2007    Priority: Low  . SIRS (systemic inflammatory response syndrome) ([place]) 07/15/2017    Medications- reviewed and updated Current Outpatient Medications  Medication Sig Dispense Refill  . acyclovir (ZOVIRAX) 400 MG tablet Take 400 mg by mouth 2 (two) times daily.    Marland Kitchen amLODipine (NORVASC) 10 MG tablet Take 10 mg by mouth at bedtime.    Marland Kitchen atenolol (TENORMIN) 50 MG tablet Take 0.5 tablets (25 mg total) by mouth every evening.    .  cholecalciferol (VITAMIN D) 1000 units tablet Take 2,000 Units by mouth daily.     Marland Kitchen enoxaparin (LOVENOX) 80 MG/0.8ML injection Inject 80 mg into the skin every 12 (twelve) hours.     . fluticasone (FLONASE) 50 MCG/ACT nasal spray Place 2 sprays into both nostrils at bedtime.     . folic acid (FOLVITE) 161 MCG tablet Take 800 mcg by mouth daily.     . hydrocortisone (CORTEF) 10 MG tablet Take 5 tablets in AM and 3 tablet in afternoon thru 1/28. Then Take 4 tablets in AM and 3 tablets in afternoon starting 1/29.   He is down to 30mg  BID 08/06/17 120 tablet 1  . insulin detemir (LEVEMIR) 100 UNIT/ML injection Decrease Levemir to 5 U at night 10 mL 11  . insulin regular (NOVOLIN R,HUMULIN R) 100 units/mL injection Inject into the skin 3 (three) times daily before meals. Sliding scale    . loratadine (CLARITIN) 10 MG tablet Take 10 mg by mouth daily.    . Magnesium Oxide 400 MG CAPS Take 1 capsule (400 mg total) by mouth 5 (five) times daily.    . Melatonin 3 MG TABS Take by mouth. 1-3 tabs as needed    . omeprazole (PRILOSEC OTC) 20 MG tablet Take 2 tablets (40 mg total) by mouth 2 (two) times daily. take 1 tablet by mouth once daily (Patient taking differently: Take 40 mg by mouth 2 (two) times daily. )    .  ondansetron (ZOFRAN) 4 MG tablet Take 1 tablet (4 mg total) by mouth every 8 (eight) hours as needed for nausea or vomiting. 30 tablet 1  . polyethylene glycol (MIRALAX / GLYCOLAX) packet Take 17 g by mouth daily as needed for mild constipation or moderate constipation.     . posaconazole (NOXAFIL) 100 MG TBEC delayed-release tablet Take 300 mg by mouth daily.    Marland Kitchen senna-docusate (SENOKOT-S) 8.6-50 MG tablet Take 2 tablets by mouth Nightly.     . sulfamethoxazole-trimethoprim (BACTRIM,SEPTRA) 400-80 MG tablet Take 1 tablet by mouth daily.    . tacrolimus (PROGRAF) 0.5 MG capsule Take 0.5 mg by mouth daily after breakfast.   5   No current facility-administered medications for this visit.      Objective: BP 124/80 (BP Location: Left Arm, Patient Position: Sitting, Cuff Size: Large)   Pulse 62   Temp 98.2 F (36.8 C) (Oral)   Ht 5\' 11"  (1.803 m)   Wt 228 lb (103.4 kg)   SpO2 96%   BMI 31.80 kg/m  Gen: NAD, resting comfortably TM normal, oropharynx normal, nares largely normal Puffiness through face CV: RRR no murmurs rubs or gallops Lungs: CTAB no crackles, wheeze, rhonchi Abdomen: soft/nontender/nondistended/normal bowel sounds. No rebound or guarding.  Ext: 1+ edema Skin: warm, dry  Assessment/Plan:  Adrenocortical insufficiency (HCC) S: Patient with 2 hospitalizations in January 2019- treated with fluids, antibiotics, steroids- ultimately thought most likely due to adrenal insufficiency with no obvious source of infection found. Seemed to be triggered by stepping down prednisone. He is now off prednisone and on hydrocortisone 30mg  AM, 30 mg PM. He states his edema in his legs is down some after hydrocortisone reduced slightly at [facility] A/P: Patient has been referred to [facility] endocrinology for further evaluation/management- I told patient I thought this was an excellent idea   SIRS (systemic inflammatory response syndrome) ([place]) From notes does not appear these hospitalizations were  clearly sepsis so removed these diagnosis from problem list. Possibly sepsis/sirs vs. Purely adrenal insufficiency- but with that once again not fully clear what trigger is other than potentially stepping down on prednisone.   Post chemotherapy Dry eyes due to decreased tear production S: Patient has seen a world renowned ophthalmologist  from [place] Dr. Sharen Counter per his report- dry eyes thought to be post chemotherapy related.  A/P: Patient will continue prn artificial tears- apparently this will be a long term issue most likely   Hyperlipidemia S: suspect poorly controlled on no statin. He was on Simvastatin 40mg  before MDS with excess blasts requiring bone marrow transplant Lab Results   Component Value Date   CHOL 146 08/10/2014   HDL 43.10 08/10/2014   LDLCALC 79 08/10/2014   LDLDIRECT 92.0 05/04/2015   TRIG 122.0 08/10/2014   CHOLHDL 3 08/10/2014   A/P: patient asks about restarting statin. We discussed at the point where [facility] clears [facility] to get immunizations again that we would begin to consider statin again- for now want him to hold off with complexity of illness and desire to not further complicate with medication additions   Essential hypertension S: controlled on Atenolol 12.5mg , amlodipine 10mg  BP Readings from Last 3 Encounters:  08/06/17 124/80  07/21/17 127/84  07/09/17 117/77  A/P: We discussed blood pressure goal of <140/90. Continue current meds   Deep vein thrombosis (DVT) of femoral vein of right lower extremity (HCC) Remains on lovenox- plan is at least a year from onset- potentially longer. Patient states these injections are tougher for him  than his insulin.   Return precautions advised.  Garret Reddish, MD

## 2017-08-07 LAB — HEPATIC FUNCTION PANEL
ALT: 63 — AB (ref 10–40)
AST: 31 (ref 14–40)
Alkaline Phosphatase: 130 — AB (ref 25–125)
Bilirubin, Total: 0.7

## 2017-08-07 LAB — BASIC METABOLIC PANEL
BUN: 17 (ref 4–21)
Creatinine: 1.1 (ref 0.6–1.3)
POTASSIUM: 4 (ref 3.4–5.3)
SODIUM: 135 — AB (ref 137–147)

## 2017-08-07 LAB — CBC AND DIFFERENTIAL
HCT: 41 (ref 41–53)
HEMOGLOBIN: 13.5 (ref 13.5–17.5)
Platelets: 223 (ref 150–399)
WBC: 10.2

## 2017-08-21 LAB — CBC AND DIFFERENTIAL
HCT: 42 (ref 41–53)
Hemoglobin: 14 (ref 13.5–17.5)
PLATELETS: 258 (ref 150–399)
WBC: 9.5

## 2017-08-21 LAB — HEPATIC FUNCTION PANEL
ALT: 56 — AB (ref 10–40)
AST: 24 (ref 14–40)
Alkaline Phosphatase: 160 — AB (ref 25–125)
Bilirubin, Total: 0.7

## 2017-08-21 LAB — BASIC METABOLIC PANEL
BUN: 16 (ref 4–21)
CREATININE: 1.2 (ref 0.6–1.3)
GLUCOSE: 145
Potassium: 4 (ref 3.4–5.3)
Sodium: 137 (ref 137–147)

## 2017-08-22 ENCOUNTER — Encounter: Payer: Self-pay | Admitting: Family Medicine

## 2017-08-26 ENCOUNTER — Encounter: Payer: Self-pay | Admitting: Family Medicine

## 2017-09-04 LAB — BASIC METABOLIC PANEL
BUN: 19 (ref 4–21)
CREATININE: 1.1 (ref 0.6–1.3)
GLUCOSE: 114
POTASSIUM: 4 (ref 3.4–5.3)
SODIUM: 136 — AB (ref 137–147)

## 2017-09-04 LAB — CBC AND DIFFERENTIAL
HCT: 42 (ref 41–53)
HEMOGLOBIN: 13.7 (ref 13.5–17.5)
Platelets: 217 (ref 150–399)
WBC: 11.2

## 2017-09-04 LAB — HEPATIC FUNCTION PANEL
ALT: 63 — AB (ref 10–40)
AST: 30 (ref 14–40)
Alkaline Phosphatase: 105 (ref 25–125)
Bilirubin, Total: 0.6

## 2017-09-09 ENCOUNTER — Encounter: Payer: Self-pay | Admitting: Family Medicine

## 2017-09-18 LAB — BASIC METABOLIC PANEL
BUN: 22 — AB (ref 4–21)
CREATININE: 1.1 (ref 0.6–1.3)
Glucose: 118
Potassium: 4.1 (ref 3.4–5.3)
SODIUM: 136 — AB (ref 137–147)

## 2017-09-18 LAB — HEPATIC FUNCTION PANEL
ALT: 69 — AB (ref 10–40)
AST: 25 (ref 14–40)
Alkaline Phosphatase: 100 (ref 25–125)
BILIRUBIN, TOTAL: 0.8

## 2017-09-18 LAB — CBC AND DIFFERENTIAL
HEMATOCRIT: 44 (ref 41–53)
Hemoglobin: 14.4 (ref 13.5–17.5)
Platelets: 208 (ref 150–399)
WBC: 9.4

## 2017-09-23 ENCOUNTER — Encounter: Payer: Self-pay | Admitting: Family Medicine

## 2017-09-30 ENCOUNTER — Encounter: Payer: Self-pay | Admitting: Family Medicine

## 2017-10-09 LAB — HEPATIC FUNCTION PANEL
ALT: 63 — AB (ref 10–40)
AST: 24 (ref 14–40)
Alkaline Phosphatase: 101 (ref 25–125)
Bilirubin, Total: 0.8

## 2017-10-09 LAB — BASIC METABOLIC PANEL
BUN: 24 — AB (ref 4–21)
CREATININE: 1.2 (ref 0.6–1.3)
Glucose: 141
POTASSIUM: 4.4 (ref 3.4–5.3)
SODIUM: 135 — AB (ref 137–147)

## 2017-10-09 LAB — CBC AND DIFFERENTIAL
HCT: 44 (ref 41–53)
Hemoglobin: 14.4 (ref 13.5–17.5)
Platelets: 224 (ref 150–399)
WBC: 9.6

## 2017-10-09 NOTE — Progress Notes (Signed)
Subjective:   Brandon [name] is a 67 y.o. male who presents for Medicare Annual/Subsequent preventive examination.  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  He will take his other imm next month    Health Maintenance Due  Topic Date Due  . COLONOSCOPY  12/20/2014   [facility] checks his feet and will postpone  Will defer other Diabetic labs as the doctors are evaluating and he is currently seeing endo at [facility]; is using less insulin Colonoscopy due q 5 years; 11/2014- will be at [facility]  Discussed this  for the first time last visit Undecided if the risk is worth repeating at this time  Ophthalmology exam - chemo destroyed the oil glands [facility] doctors are checking his eyes q 6 months and is seeing ophthalmologist next week; will defer eye exam   Labs deferred as these are being managed at [facility]     Objective:    Vitals: BP 119/68   Pulse 63   Ht 5\' 11"  (1.803 m)   Wt 240 lb (108.9 kg)   SpO2 97%   BMI 33.47 kg/m   Body mass index is 33.47 kg/m.  Advanced Directives 07/15/2017 07/15/2017 07/07/2017 07/06/2017 03/22/2017 08/06/2016 01/08/2016  Does Patient Have a Medical Advance Directive? Yes No Yes Yes Yes Yes Yes  Type of Paramedic of [place];Living will - [place];Living will [place];Living will - - [place];Living will  Does patient want to make changes to medical advance directive? No - Patient declined No - Patient declined No - Patient  declined - - - -  Copy of [place] in Chart? Yes - Yes Yes - - -  Would patient like information on creating a medical advance directive? - - - - - - -    Tobacco Social History   Tobacco Use  Smoking Status Never Smoker  Smokeless Tobacco Never Used     Counseling given: Yes   Clinical Intake:     Past Medical History:  Diagnosis Date  . Allergy    seasonal/animals  . Chronic prostatitis 12/17/2008  . Diverticulosis 01/16/2011   History of diverticulitis   . ERECTILE DYSFUNCTION 02/07/2007  . HYPERLIPIDEMIA 02/04/2007  . HYPERTENSION 02/04/2007  . NEOPLASM, MALIGNANT, SKIN, BACK 02/09/2009  . S/P bone marrow transplant [facility])    Past Surgical History:  Procedure Laterality Date  . none     Family History  Problem Relation Age of Onset  . Breast cancer Mother   . CVA Mother        quit taking HTN meds  . Alzheimer's disease Father        late 18s. mid 5s  . Asperger's syndrome Son    Social History   Socioeconomic History  . Marital status: Married    Spouse name: Not on file  . Number of children: Not on file  . Years of education: Not on  file  . Highest education level: Not on file  Occupational History  . Not on file  Social Needs  . Financial resource strain: Not on file  . Food insecurity:    Worry: Not on file    Inability: Not on file  . Transportation needs:    Medical: Not on file    Non-medical: Not on file  Tobacco Use  . Smoking status: Never Smoker  . Smokeless tobacco: Never Used  Substance and Sexual Activity  . Alcohol use: Yes    Alcohol/week: 1.8 oz    Types: 3 Standard drinks or equivalent per week  . Drug use: No  . Sexual activity: Yes  Lifestyle  . Physical activity:    Days per week: Not on file    Minutes per session: Not on file  . Stress: Not on file  Relationships  . Social connections:    Talks on phone: Not on file    Gets together: Not on file    Attends religious service: Not on file     Active member of club or organization: Not on file    Attends meetings of clubs or organizations: Not on file    Relationship status: Not on file  Other Topics Concern  . Not on file  Social History Narrative   Married 1973. Wife has Parkinsons. 2 daughters, 1 son (middle). 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 acyclovir (ZOVIRAX) 400 MG tablet Take 400 mg by mouth 2 (two) times daily.  Marland Kitchen amLODipine (NORVASC) 10 MG tablet Take 10 mg by mouth at bedtime.  Marland Kitchen atenolol (TENORMIN) 50 MG tablet Take 0.5 tablets (25 mg total) by mouth every evening.  . cholecalciferol (VITAMIN D) 1000 units tablet Take 2,000 Units by mouth daily.   Marland Kitchen enoxaparin (LOVENOX) 80 MG/0.8ML injection Inject 80 mg into the skin every 12 (twelve) hours.   . fluticasone (FLONASE) 50 MCG/ACT nasal spray Place 2 sprays into both nostrils at bedtime.   . folic acid (FOLVITE) 409 MCG tablet Take 800 mcg by mouth daily.   . hydrocortisone (CORTEF) 10 MG tablet Take 5 tablets in AM and 3 tablet in afternoon thru 1/28. Then Take 4 tablets in AM and 3 tablets in afternoon starting 1/29  . insulin detemir (LEVEMIR) 100 UNIT/ML injection Decrease Levemir to 5 U at night  . insulin regular (NOVOLIN R,HUMULIN R) 100 units/mL injection Inject into the skin 3 (three) times daily before meals. Sliding scale  . loratadine (CLARITIN) 10 MG tablet Take 10 mg by mouth daily.  . Magnesium Oxide 400 MG CAPS Take 1 capsule (400 mg total) by mouth 5 (five) times daily.  . Melatonin 3 MG TABS Take by mouth. 1-3 tabs as needed  . omeprazole (PRILOSEC OTC) 20 MG tablet Take 2 tablets (40 mg total) by mouth 2 (two) times daily. take 1 tablet by mouth once daily (Patient taking differently: Take 40 mg by mouth 2 (two) times daily. )  . ondansetron (ZOFRAN) 4 MG  tablet Take 1 tablet (4 mg total) by mouth every 8 (eight) hours as needed for nausea or vomiting.  . polyethylene glycol (MIRALAX / GLYCOLAX) packet Take 17 g by mouth daily as needed for mild constipation or moderate constipation.   . posaconazole (NOXAFIL) 100 MG TBEC delayed-release tablet  Take 300 mg by mouth daily.  Marland Kitchen senna-docusate (SENOKOT-S) 8.6-50 MG tablet Take 2 tablets by mouth Nightly.   . sulfamethoxazole-trimethoprim (BACTRIM,SEPTRA) 400-80 MG tablet Take 1 tablet by mouth daily.  . tacrolimus (PROGRAF) 0.5 MG capsule Take 0.5 mg by mouth daily after breakfast.    No facility-administered encounter medications on file as of 10/10/2017.     Activities of Daily Living In your present state of health, do you have any difficulty performing the following activities: 07/15/2017 07/07/2017  Hearing? N N  Vision? N N  Difficulty concentrating or making decisions? N N  Walking or climbing stairs? N N  Dressing or bathing? N N  Doing errands, shopping? N N  Some recent data might be hidden    Patient Care Team: [name], MD as PCP - General (Family Medicine)   Assessment:   This is a routine wellness examination for Brandon [name].  Exercise Activities and Dietary recommendations    Goals    . Exercise 150 min/wk Moderate Activity     [facility] team has asked him to walk Has a treadmill at home Walk outside where you enjoy or have a passion. May find a buddy Set a goal        Fall Risk Fall Risk  03/22/2017 02/24/2016 11/15/2014 02/18/2013  Falls in the past year? No No No No  Comment - Emmi Telephone Survey: data to providers prior to load - -    Depression Screen PHQ 2/9 Scores 03/22/2017 11/15/2014 02/18/2013  PHQ - 2 Score 0 0 1   Has had 5 friends die in the last few months for various reasons  Wife has parkinson's but has abated for now but not able to do her exercise for care giving role for him.   States he is not a "touchy feely guy" but it is hard at times  He  was very motivated but this is going on over 1 and half years but has not lost hope, just tired and has bad days on occasion to be expected. [facility] has opened up a group which he has agreed to go too    Cognitive Function     Ad8 score reviewed for issues:  Issues making decisions:  Less interest in hobbies / activities:  Repeats questions, stories (family complaining):  Trouble using ordinary gadgets (microwave, computer, phone):  Forgets the month or year:   Mismanaging finances:   Remembering appts:  Daily problems with thinking and/or memory: Ad8 score is= 0        Immunization History  Administered Date(s) Administered  . Hepatitis A 12/25/2005, 11/25/2007  . Hepatitis B 05/25/1996, 06/25/1997, 12/01/1998  . Influenza, High Dose Seasonal PF 07/09/2017  . Influenza,inj,Quad PF,6+ Mos 02/18/2013, 05/03/2014  . Influenza-Unspecified 03/16/2015  . PPD Test 08/15/2012  . Pneumococcal Conjugate-13 05/11/2015  . Pneumococcal Polysaccharide-23 05/03/2014  . Td 12/05/2005  . Zoster 02/15/2012     Screening Tests Health Maintenance  Topic Date Due  . COLONOSCOPY  12/20/2014  . URINE MICROALBUMIN  11/09/2017 (Originally 08/26/1960)  . TETANUS/TDAP  10/11/2018 (Originally 12/06/2015)  . INFLUENZA VACCINE  01/23/2018  . HEMOGLOBIN A1C  04/11/2018  . FOOT EXAM  10/11/2018  . OPHTHALMOLOGY EXAM  10/11/2018  . PNA vac Low Risk Adult (2 of 2 - PPSV23) 05/04/2019  . Hepatitis C Screening  Completed       Plan:      PCP Notes   Health Maintenance IMM planned to be updated at [facility] next visit A1c  and microalbumin managed at [facility];  Eye exam q 6 months and next exam is next week All deferred in lieu of [facility] managing  To NOTE; [name] would like Dr. Ansel Bong opinion of whether to proceed with the colonoscopy or not. This was just discussed yesterday and he is undecided and [facility] seems to be as well. Was asking him about pathology and given report but would like your  opinion  Mr Brandon [name] has gained weight due to prednisone etc. Stated he will be glad to see Dr. Yong Channel for a normal CPE!  Off Lipitor and diet very modified at present, so not a lot of food choices.  Assured he will have time to get "back on track" once [facility] feels his transplant is stable.  Did want to know if he needs fup apt with Dr. Yong Channel?   Abnormal Screens  none Referrals  none  Patient concerns; As noted  Nurse Concerns; As noted  Next PCP apt To be scheduled     (Please advise or if you want to outreach him personally with his questions. Tks)   I have personally reviewed and noted the following in the patient's chart:   . Medical and social history . Use of alcohol, tobacco or illicit drugs  . Current medications and supplements . Functional ability and status . Nutritional status . Physical activity . Advanced directives . List of other physicians . Hospitalizations, surgeries, and ER visits in previous 12 months . Vitals . Screenings to include cognitive, depression, and falls . Referrals and appointments  In addition, I have reviewed and discussed with patient certain preventive protocols, quality metrics, and best practice recommendations. A written personalized care plan for preventive services as well as general preventive health recommendations were provided to patient.     Wynetta Fines, RN  10/10/2017

## 2017-10-10 ENCOUNTER — Ambulatory Visit (INDEPENDENT_AMBULATORY_CARE_PROVIDER_SITE_OTHER): Payer: Medicare Other | Admitting: *Deleted

## 2017-10-10 ENCOUNTER — Encounter: Payer: Self-pay | Admitting: *Deleted

## 2017-10-10 VITALS — BP 119/68 | HR 63 | Ht 71.0 in | Wt 240.0 lb

## 2017-10-10 DIAGNOSIS — Z0000 Encounter for general adult medical examination without abnormal findings: Secondary | ICD-10-CM

## 2017-10-10 NOTE — Patient Instructions (Addendum)
[name] , Thank you for taking time to come for your Medicare Wellness Visit. I appreciate your ongoing commitment to your health goals. Please review the following plan we discussed and let me know if I can assist you in the future.   Brandon [name] to you!   Will ask Dr. Yong Channel to weigh in on your colonoscopy!  These are the goals we discussed: Goals    . Exercise 150 min/wk Moderate Activity     [facility] team has asked him to walk Has a treadmill at home Walk outside where you enjoy or have a passion. May find a buddy Set a goal        This is a list of the screening recommended for you and due dates:  Health Maintenance  Topic Date Due  . Complete foot exam   08/[birth year]/1959  . Eye exam for diabetics  08/26/1959  . Urine Protein Check  08/26/1959  . Colon Cancer Screening  12/20/2014  . Tetanus Vaccine  12/06/2015  . Hemoglobin A1C  05/04/2016  . Flu Shot  01/23/2018  . Pneumonia vaccines (2 of 2 - PPSV23) 05/04/2019  .  Hepatitis C: One time screening is recommended by Center for Disease Control  (CDC) for  adults born from [birth year] through 1966.   Completed      Diabetes and Foot Care Diabetes may cause you to have problems because of poor blood supply (circulation) to your feet and legs. This may cause the skin on your feet to become thinner, break easier, and heal more slowly. Your skin may become dry, and the skin may peel and crack. You may also have nerve damage in your legs and feet causing decreased feeling in them. You may not notice minor injuries to your feet that could lead to infections or more serious problems. Taking care of your feet is one of the most important things you can do for yourself. Follow these instructions at home:  Wear shoes at all times, even in the house. Do not go barefoot. Bare feet are easily injured.  Check your feet daily for blisters, cuts, and redness. If you cannot see the bottom of your feet, use a mirror or ask someone for help.  Wash your  feet with warm water (do not use hot water) and mild soap. Then pat your feet and the areas between your toes until they are completely dry. Do not soak your feet as this can dry your skin.  Apply a moisturizing lotion or petroleum jelly (that does not contain alcohol and is unscented) to the skin on your feet and to dry, brittle toenails. Do not apply lotion between your toes.  Trim your toenails straight across. Do not dig under them or around the cuticle. File the edges of your nails with an emery board or nail file.  Do not cut corns or calluses or try to remove them with medicine.  Wear clean socks or stockings every day. Make sure they are not too tight. Do not wear knee-high stockings since they may decrease blood flow to your legs.  Wear shoes that fit properly and have enough cushioning. To break in new shoes, wear them for just a few hours a day. This prevents you from injuring your feet. Always look in your shoes before you put them on to be sure there are no objects inside.  Do not cross your legs. This may decrease the blood flow to your feet.  If you find a minor scrape, cut,  or break in the skin on your feet, keep it and the skin around it clean and dry. These areas may be cleansed with mild soap and water. Do not cleanse the area with peroxide, alcohol, or iodine.  When you remove an adhesive bandage, be sure not to damage the skin around it.  If you have a wound, look at it several times a day to make sure it is healing.  Do not use heating pads or hot water bottles. They may burn your skin. If you have lost feeling in your feet or legs, you may not know it is happening until it is too late.  Make sure your health care provider performs a complete foot exam at least annually or more often if you have foot problems. Report any cuts, sores, or bruises to your health care provider immediately. Contact a health care provider if:  You have an injury that is not healing.  You  have cuts or breaks in the skin.  You have an ingrown nail.  You notice redness on your legs or feet.  You feel burning or tingling in your legs or feet.  You have pain or cramps in your legs and feet.  Your legs or feet are numb.  Your feet always feel cold. Get help right away if:  There is increasing redness, swelling, or pain in or around a wound.  There is a red line that goes up your leg.  Pus is coming from a wound.  You develop a fever or as directed by your health care provider.  You notice a bad smell coming from an ulcer or wound. This information is not intended to replace advice given to you by your health care provider. Make sure you discuss any questions you have with your health care provider. Document Released: 06/08/2000 Document Revised: 11/17/2015 Document Reviewed: 11/18/2012 Elsevier Interactive Patient Education  2017 [place] Prevention in the Home Falls can cause injuries. They can happen to people of all ages. There are many things you can do to make your home safe and to help prevent falls. What can I do on the outside of my home?  Regularly fix the edges of walkways and driveways and fix any cracks.  Remove anything that might make you trip as you walk through a door, such as a raised step or threshold.  Trim any bushes or trees on the path to your home.  Use bright outdoor lighting.  Clear any walking paths of anything that might make someone trip, such as rocks or tools.  Regularly check to see if handrails are loose or broken. Make sure that both sides of any steps have handrails.  Any raised decks and porches should have guardrails on the edges.  Have any leaves, snow, or ice cleared regularly.  Use sand or salt on walking paths during winter.  Clean up any spills in your garage right away. This includes oil or grease spills. What can I do in the bathroom?  Use night lights.  Install grab bars by the toilet and in  the tub and shower. Do not use towel bars as grab bars.  Use non-skid mats or decals in the tub or shower.  If you need to sit down in the shower, use a plastic, non-slip stool.  Keep the floor dry. Clean up any water that spills on the floor as soon as it happens.  Remove soap buildup in the tub or shower regularly.  Attach bath  mats securely with double-sided non-slip rug tape.  Do not have throw rugs and other things on the floor that can make you trip. What can I do in the bedroom?  Use night lights.  Make sure that you have a light by your bed that is easy to reach.  Do not use any sheets or blankets that are too big for your bed. They should not hang down onto the floor.  Have a firm chair that has side arms. You can use this for support while you get dressed.  Do not have throw rugs and other things on the floor that can make you trip. What can I do in the kitchen?  Clean up any spills right away.  Avoid walking on wet floors.  Keep items that you use a lot in easy-to-reach places.  If you need to reach something above you, use a strong step stool that has a grab bar.  Keep electrical cords out of the way.  Do not use floor polish or wax that makes floors slippery. If you must use wax, use non-skid floor wax.  Do not have throw rugs and other things on the floor that can make you trip. What can I do with my stairs?  Do not leave any items on the stairs.  Make sure that there are handrails on both sides of the stairs and use them. Fix handrails that are broken or loose. Make sure that handrails are as long as the stairways.  Check any carpeting to make sure that it is firmly attached to the stairs. Fix any carpet that is loose or worn.  Avoid having throw rugs at the top or bottom of the stairs. If you do have throw rugs, attach them to the floor with carpet tape.  Make sure that you have a light switch at the top of the stairs and the bottom of the stairs. If  you do not have them, ask someone to add them for you. What else can I do to help prevent falls?  Wear shoes that: ? Do not have high heels. ? Have rubber bottoms. ? Are comfortable and fit you well. ? Are closed at the toe. Do not wear sandals.  If you use a stepladder: ? Make sure that it is fully opened. Do not climb a closed stepladder. ? Make sure that both sides of the stepladder are locked into place. ? Ask someone to hold it for you, if possible.  Clearly mark and make sure that you can see: ? Any grab bars or handrails. ? First and last steps. ? Where the edge of each step is.  Use tools that help you move around (mobility aids) if they are needed. These include: ? Canes. ? Walkers. ? Scooters. ? Crutches.  Turn on the lights when you go into a dark area. Replace any light bulbs as soon as they burn out.  Set up your furniture so you have a clear path. Avoid moving your furniture around.  If any of your floors are uneven, fix them.  If there are any pets around you, be aware of where they are.  Review your medicines with your doctor. Some medicines can make you feel dizzy. This can increase your chance of falling. Ask your doctor what other things that you can do to help prevent falls. This information is not intended to replace advice given to you by your health care provider. Make sure you discuss any questions you have with your health  care provider. Document Released: 04/07/2009 Document Revised: 11/17/2015 Document Reviewed: 07/16/2014 Elsevier Interactive Patient Education  2018 [place] Maintenance, Male A healthy lifestyle and preventive care is important for your health and wellness. Ask your health care provider about what schedule of regular examinations is right for you. What should I know about weight and diet? Eat a Healthy Diet  Eat plenty of vegetables, fruits, whole grains, low-fat dairy products, and lean protein.  Do not eat a  lot of foods high in solid fats, added sugars, or salt.  Maintain a Healthy Weight Regular exercise can help you achieve or maintain a healthy weight. You should:  Do at least 150 minutes of exercise each week. The exercise should increase your heart rate and make you sweat (moderate-intensity exercise).  Do strength-training exercises at least twice a week.  Watch Your Levels of Cholesterol and Blood Lipids  Have your blood tested for lipids and cholesterol every 5 years starting at 68 years of age. If you are at high risk for heart disease, you should start having your blood tested when you are 68 years old. You may need to have your cholesterol levels checked more often if: ? Your lipid or cholesterol levels are high. ? You are older than 68 years of age. ? You are at high risk for heart disease.  What should I know about cancer screening? Many types of cancers can be detected early and may often be prevented. Lung Cancer  You should be screened every year for lung cancer if: ? You are a current smoker who has smoked for at least 30 years. ? You are a former smoker who has quit within the past 15 years.  Talk to your health care provider about your screening options, when you should start screening, and how often you should be screened.  Colorectal Cancer  Routine colorectal cancer screening usually begins at 68 years of age and should be repeated every 5-10 years until you are 68 years old. You may need to be screened more often if early forms of precancerous polyps or small growths are found. Your health care provider may recommend screening at an earlier age if you have risk factors for colon cancer.  Your health care provider may recommend using home test kits to check for hidden blood in the stool.  A small camera at the end of a tube can be used to examine your colon (sigmoidoscopy or colonoscopy). This checks for the earliest forms of colorectal cancer.  Prostate and  Testicular Cancer  Depending on your age and overall health, your health care provider may do certain tests to screen for prostate and testicular cancer.  Talk to your health care provider about any symptoms or concerns you have about testicular or prostate cancer.  Skin Cancer  Check your skin from head to toe regularly.  Tell your health care provider about any new moles or changes in moles, especially if: ? There is a change in a mole's size, shape, or color. ? You have a mole that is larger than a pencil eraser.  Always use sunscreen. Apply sunscreen liberally and repeat throughout the day.  Protect yourself by wearing long sleeves, pants, a wide-brimmed hat, and sunglasses when outside.  What should I know about heart disease, diabetes, and high blood pressure?  If you are 33-49 years of age, have your blood pressure checked every 3-5 years. If you are 46 years of age or older, have your blood pressure  checked every year. You should have your blood pressure measured twice-once when you are at a hospital or clinic, and once when you are not at a hospital or clinic. Record the average of the two measurements. To check your blood pressure when you are not at a hospital or clinic, you can use: ? An automated blood pressure machine at a pharmacy. ? A home blood pressure monitor.  Talk to your health care provider about your target blood pressure.  If you are between 61-62 years old, ask your health care provider if you should take aspirin to prevent heart disease.  Have regular diabetes screenings by checking your fasting blood sugar level. ? If you are at a normal weight and have a low risk for diabetes, have this test once every three years after the age of 34. ? If you are overweight and have a high risk for diabetes, consider being tested at a younger age or more often.  A one-time screening for abdominal aortic aneurysm (AAA) by ultrasound is recommended for men aged 66-75 years  who are current or former smokers. What should I know about preventing infection? Hepatitis B If you have a higher risk for hepatitis B, you should be screened for this virus. Talk with your health care provider to find out if you are at risk for hepatitis B infection. Hepatitis C Blood testing is recommended for:  Everyone born from [birth year] through 1966.  Anyone with known risk factors for hepatitis C.  Sexually Transmitted Diseases (STDs)  You should be screened each year for STDs including gonorrhea and chlamydia if: ? You are sexually active and are younger than 68 years of age. ? You are older than 68 years of age and your health care provider tells you that you are at risk for this type of infection. ? Your sexual activity has changed since you were last screened and you are at an increased risk for chlamydia or gonorrhea. Ask your health care provider if you are at risk.  Talk with your health care provider about whether you are at high risk of being infected with HIV. Your health care provider may recommend a prescription medicine to help prevent HIV infection.  What else can I do?  Schedule regular health, dental, and eye exams.  Stay current with your vaccines (immunizations).  Do not use any tobacco products, such as cigarettes, chewing tobacco, and e-cigarettes. If you need help quitting, ask your health care provider.  Limit alcohol intake to no more than 2 drinks per day. One drink equals 12 ounces of beer, 5 ounces of wine, or 1 ounces of hard liquor.  Do not use street drugs.  Do not share needles.  Ask your health care provider for help if you need support or information about quitting drugs.  Tell your health care provider if you often feel depressed.  Tell your health care provider if you have ever been abused or do not feel safe at home. This information is not intended to replace advice given to you by your health care provider. Make sure you discuss any  questions you have with your health care provider. Document Released: 12/08/2007 Document Revised: 02/08/2016 Document Reviewed: 03/15/2015 Elsevier Interactive Patient Education  [employer].

## 2017-10-11 NOTE — Progress Notes (Signed)
I have reviewed and agree with note, evaluation, plan.  I discussed with patient and recommended against colonoscopy at this time despite tubular adenomas being found in 2011.  He has had multiple hospitalizations due to even minor illnesses and I think it would be best to make sure things stabilize over the next 6-12 months before moving forward with colonoscopy.  I asked patient to call in on Monday for a follow-up visit in 3 or 4 months.  We can readdress at that time though I really am thinking more next year for colonoscopy if he has a good year  Garret Reddish, MD

## 2017-10-14 ENCOUNTER — Encounter: Payer: Self-pay | Admitting: Family Medicine

## 2017-11-05 ENCOUNTER — Telehealth: Payer: Self-pay

## 2017-11-05 NOTE — Telephone Encounter (Signed)
Copied from [place] #[id number]. Topic: Appointment Scheduling - Scheduling Inquiry for Clinic >> Nov 05, 2017  9:32 AM Synthia Innocent wrote: Reason for CRM: Requesting to be seen today by Dr Yong Channel for low grade fever 100 and cough. Offered another provider, declined. Please advise

## 2017-11-06 LAB — BASIC METABOLIC PANEL
BUN: 15 (ref 4–21)
CREATININE: 1.4 — AB (ref 0.6–1.3)
Glucose: 150
POTASSIUM: 3.8 (ref 3.4–5.3)
SODIUM: 131 — AB (ref 137–147)

## 2017-11-06 LAB — CBC AND DIFFERENTIAL
HCT: 38 — AB (ref 41–53)
Hemoglobin: 12.8 — AB (ref 13.5–17.5)
Platelets: 280 (ref 150–399)
WBC: 9.8

## 2017-11-06 LAB — HEPATIC FUNCTION PANEL
ALK PHOS: 211 — AB (ref 25–125)
ALT: 56 — AB (ref 10–40)
AST: 27 (ref 14–40)
Bilirubin, Total: 1.2

## 2017-11-07 NOTE — Telephone Encounter (Signed)
Called and spoke with patient who states he doesn't have a fever currently but just a cough with speaking. He had an appointment with [facility] so he followed up with them. They started him on an antibiotic.

## 2017-11-11 ENCOUNTER — Encounter: Payer: Self-pay | Admitting: Family Medicine

## 2017-11-20 LAB — HEPATIC FUNCTION PANEL
ALT: 48 — AB (ref 10–40)
AST: 29 (ref 14–40)
Alkaline Phosphatase: 159 — AB (ref 25–125)
Bilirubin, Total: 0.6

## 2017-11-20 LAB — BASIC METABOLIC PANEL
BUN: 11 (ref 4–21)
CREATININE: 1.1 (ref 0.6–1.3)
Glucose: 113
POTASSIUM: 3.6 (ref 3.4–5.3)
Sodium: 138 (ref 137–147)

## 2017-11-20 LAB — CBC AND DIFFERENTIAL
HCT: 39 — AB (ref 41–53)
Hemoglobin: 13.1 — AB (ref 13.5–17.5)
Platelets: 353 (ref 150–399)
WBC: 13.9

## 2017-11-21 ENCOUNTER — Encounter: Payer: Self-pay | Admitting: Family Medicine

## 2017-12-04 LAB — BASIC METABOLIC PANEL
BUN: 11 (ref 4–21)
CREATININE: 1.2 (ref 0.6–1.3)
GLUCOSE: 124
Potassium: 3.7 (ref 3.4–5.3)
SODIUM: 135 — AB (ref 137–147)

## 2017-12-04 LAB — CBC AND DIFFERENTIAL
HEMATOCRIT: 39 — AB (ref 41–53)
Hemoglobin: 13 — AB (ref 13.5–17.5)
PLATELETS: 381 (ref 150–399)
WBC: 13.1

## 2017-12-04 LAB — HEPATIC FUNCTION PANEL
ALT: 31 (ref 10–40)
AST: 21 (ref 14–40)
Alkaline Phosphatase: 150 — AB (ref 25–125)
BILIRUBIN, TOTAL: 1

## 2017-12-23 DIAGNOSIS — J129 Viral pneumonia, unspecified: Secondary | ICD-10-CM

## 2017-12-23 HISTORY — DX: Viral pneumonia, unspecified: J12.9

## 2017-12-25 ENCOUNTER — Encounter: Payer: Self-pay | Admitting: Family Medicine

## 2017-12-25 LAB — CBC AND DIFFERENTIAL
HCT: 40 — AB (ref 41–53)
HEMOGLOBIN: 13.1 — AB (ref 13.5–17.5)
Platelets: 380 (ref 150–399)
WBC: 11.9

## 2017-12-25 LAB — HEPATIC FUNCTION PANEL
ALK PHOS: 171 — AB (ref 25–125)
ALT: 34 (ref 10–40)
AST: 23 (ref 14–40)
Bilirubin, Total: 0.7

## 2017-12-25 LAB — BASIC METABOLIC PANEL
BUN: 10 (ref 4–21)
CREATININE: 1.1 (ref 0.6–1.3)
Glucose: 129
Potassium: 3.6 (ref 3.4–5.3)
Sodium: 137 (ref 137–147)

## 2017-12-27 ENCOUNTER — Encounter: Payer: Self-pay | Admitting: Family Medicine

## 2018-01-15 ENCOUNTER — Encounter: Payer: Self-pay | Admitting: Family Medicine

## 2018-01-19 LAB — CBC AND DIFFERENTIAL
HCT: 34 — AB (ref 41–53)
HEMOGLOBIN: 10.9 — AB (ref 13.5–17.5)
PLATELETS: 205 (ref 150–399)
WBC: 7.4

## 2018-01-19 LAB — HEPATIC FUNCTION PANEL
ALK PHOS: 132 — AB (ref 25–125)
ALT: 38 (ref 10–40)
AST: 34 (ref 14–40)
Bilirubin, Total: 0.9

## 2018-01-19 LAB — BASIC METABOLIC PANEL
BUN: 9 (ref 4–21)
Creatinine: 1.1 (ref 0.6–1.3)
Glucose: 142
POTASSIUM: 3.8 (ref 3.4–5.3)
Sodium: 138 (ref 137–147)

## 2018-01-24 ENCOUNTER — Encounter: Payer: Self-pay | Admitting: Family Medicine

## 2018-01-24 ENCOUNTER — Telehealth: Payer: Self-pay | Admitting: Family Medicine

## 2018-01-24 NOTE — Telephone Encounter (Signed)
Copied from [id number]. Topic: Appointment Scheduling - Scheduling Inquiry for Clinic >> Jan 22, 2018  8:43 AM Margot Ables wrote: Pt was just released from [facility] for a virus. Pt has follow up next week. He is asking if follow up can be moved out further with Dr. Yong Channel and requesting advice from CMA/nurse. Please call cell  [phone].  >> Jan 24, 2018  8:43 AM Synthia Innocent wrote: Patient calling again, please advise

## 2018-01-24 NOTE — Telephone Encounter (Signed)
Rescheduled patient per his request.

## 2018-01-27 IMAGING — DX DG CHEST 1V PORT
1 series · 1 of 1 positions shown · non-contrast
Comparison: 01/08/2016.

CLINICAL DATA: Fever, chills and nausea.  Shortness of breath.

EXAM:
PORTABLE CHEST 1 VIEW

[chest ap]
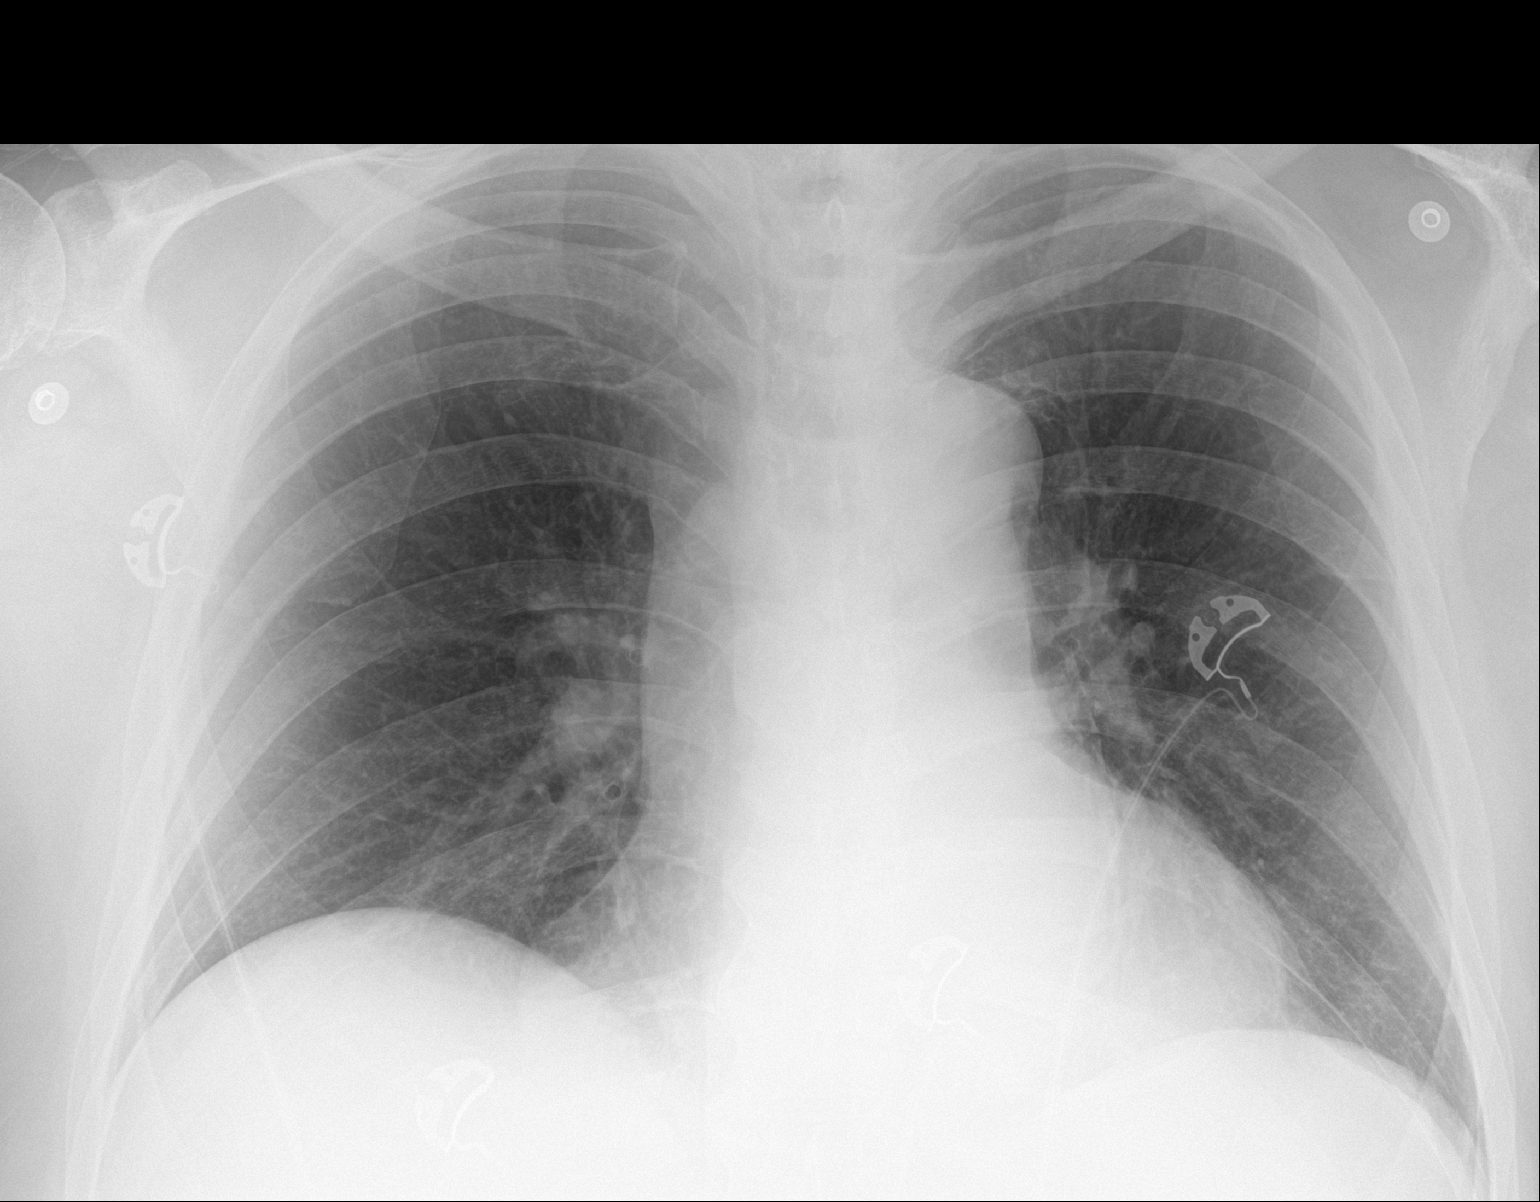

[1 of 1 positions shown; findings below may reference images not displayed]

FINDINGS: Poor inspiration. Borderline enlarged cardiac silhouette, magnified
by the poor inspiration and portable AP technique. Clear lungs with
normal vascularity. Thoracic spine degenerative changes.
IMPRESSION: No acute abnormality.

## 2018-01-28 ENCOUNTER — Ambulatory Visit: Payer: Medicare Other | Admitting: Family Medicine

## 2018-01-29 LAB — HEPATIC FUNCTION PANEL
ALT: 35 (ref 10–40)
AST: 22 (ref 14–40)
Alkaline Phosphatase: 139 — AB (ref 25–125)
BILIRUBIN, TOTAL: 0.7

## 2018-01-29 LAB — BASIC METABOLIC PANEL
CREATININE: 1.1 (ref 0.6–1.3)
Glucose: 121
Potassium: 3.6 (ref 3.4–5.3)
Sodium: 137 (ref 137–147)

## 2018-01-29 LAB — CBC AND DIFFERENTIAL
HCT: 42 (ref 41–53)
Hemoglobin: 13.6 (ref 13.5–17.5)
Platelets: 303 (ref 150–399)
WBC: 14.1

## 2018-02-03 ENCOUNTER — Encounter: Payer: Self-pay | Admitting: Family Medicine

## 2018-02-03 ENCOUNTER — Ambulatory Visit: Payer: Medicare Other | Admitting: Family Medicine

## 2018-02-21 ENCOUNTER — Ambulatory Visit (INDEPENDENT_AMBULATORY_CARE_PROVIDER_SITE_OTHER): Payer: Medicare Other | Admitting: Family Medicine

## 2018-02-21 ENCOUNTER — Encounter: Payer: Self-pay | Admitting: Family Medicine

## 2018-02-21 VITALS — BP 138/80 | HR 64 | Temp 97.9°F | Ht 71.0 in | Wt 236.2 lb

## 2018-02-21 DIAGNOSIS — I10 Essential (primary) hypertension: Secondary | ICD-10-CM | POA: Diagnosis not present

## 2018-02-21 DIAGNOSIS — E785 Hyperlipidemia, unspecified: Secondary | ICD-10-CM | POA: Diagnosis not present

## 2018-02-21 DIAGNOSIS — I82411 Acute embolism and thrombosis of right femoral vein: Secondary | ICD-10-CM

## 2018-02-21 DIAGNOSIS — D89813 Graft-versus-host disease, unspecified: Secondary | ICD-10-CM

## 2018-02-21 DIAGNOSIS — D469 Myelodysplastic syndrome, unspecified: Secondary | ICD-10-CM

## 2018-02-21 DIAGNOSIS — R739 Hyperglycemia, unspecified: Secondary | ICD-10-CM

## 2018-02-21 DIAGNOSIS — T380X5A Adverse effect of glucocorticoids and synthetic analogues, initial encounter: Secondary | ICD-10-CM

## 2018-02-21 MED ORDER — AMLODIPINE BESYLATE 2.5 MG PO TABS: 2.5000 mg | ORAL_TABLET | Freq: Every day | ORAL | 3 refills | Status: DC

## 2018-02-21 NOTE — Progress Notes (Signed)
Subjective:  Brandon [name] is a 67 y.o. year old very pleasant male patient who presents for/with See problem oriented charting ROS- No chest pain or shortness of breath. No headache. Edema noted. blurry vision from cataracts.   Past Medical History-  Patient Active Problem List   Diagnosis Date Noted  . Adrenocortical insufficiency ([place]) 08/06/2017    Priority: High  . GVHD (graft versus host disease) ([place]) 03/04/2017    Priority: High  . MDS (myelodysplastic syndrome) ([place]) 12/06/2015    Priority: High  . Steroid-induced hyperglycemia 08/16/2014    Priority: High  . Post chemotherapy Dry eyes due to decreased tear production 08/06/2017    Priority: Medium  . post chemotherapy Peripheral neuropathy 08/06/2017    Priority: Medium  . BPH associated with nocturia 11/15/2014    Priority: Medium  . Hyperlipidemia 02/04/2007    Priority: Medium  . Essential hypertension 02/04/2007    Priority: Medium  . History of stem cell transplant ([place]) 03/04/2017    Priority: Low  . Deep vein thrombosis (DVT) of femoral vein of right lower extremity ([place]) 03/04/2017    Priority: Low  . Leukopenia 11/15/2014    Priority: Low  . Chest pain 08/13/2014    Priority: Low  . GERD (gastroesophageal reflux disease) 05/03/2014    Priority: Low  . Cervical disc disorder with radiculopathy of cervical region 07/14/2010    Priority: Low  . MIXED HEARING LOSS BILATERAL 07/14/2010    Priority: Low  . ERECTILE DYSFUNCTION 02/07/2007    Priority: Low  . SIRS (systemic inflammatory response syndrome) ([place]) 07/15/2017    Medications- reviewed and updated Current Outpatient Medications  Medication Sig Dispense Refill  . acyclovir (ZOVIRAX) 400 MG tablet Take 400 mg by mouth 2 (two) times daily.    Marland Kitchen albuterol (PROVENTIL HFA;VENTOLIN HFA) 108 (90 Base) MCG/ACT inhaler Inhale into the lungs every 6 (six) hours as needed for wheezing or shortness of breath.    Marland Kitchen aspirin 81 MG chewable tablet Chew by mouth  daily.    Marland Kitchen atenolol (TENORMIN) 50 MG tablet Take 0.5 tablets (25 mg total) by mouth every evening.    . cholecalciferol (VITAMIN D) 1000 units tablet Take 2,000 Units by mouth daily.     Marland Kitchen erythromycin (E-MYCIN) 250 MG tablet Take 250 mg by mouth.    . fluticasone (FLOVENT HFA) 220 MCG/ACT inhaler Inhale into the lungs 2 (two) times daily.    . hydrocortisone (CORTEF) 10 MG tablet Take 5 tablets in AM and 3 tablet in afternoon thru 1/28. Then Take 4 tablets in AM and 3 tablets in afternoon starting 1/29 120 tablet 1  . loratadine (CLARITIN) 10 MG tablet Take 10 mg by mouth daily.    . Magnesium Oxide 400 MG CAPS Take 1 capsule (400 mg total) by mouth 5 (five) times daily.    . Melatonin 3 MG TABS Take by mouth. 1-3 tabs as needed    . montelukast (SINGULAIR) 10 MG tablet Take 10 mg by mouth at bedtime.    Marland Kitchen omeprazole (PRILOSEC OTC) 20 MG tablet Take 2 tablets (40 mg total) by mouth 2 (two) times daily. take 1 tablet by mouth once daily (Patient taking differently: Take 40 mg by mouth 2 (two) times daily. )    . ondansetron (ZOFRAN) 4 MG tablet Take 1 tablet (4 mg total) by mouth every 8 (eight) hours as needed for nausea or vomiting. 30 tablet 1  . polyethylene glycol (MIRALAX / GLYCOLAX) packet Take 17 g by  mouth daily as needed for mild constipation or moderate constipation.     . posaconazole (NOXAFIL) 100 MG TBEC delayed-release tablet Take 300 mg by mouth daily.    Marland Kitchen senna-docusate (SENOKOT-S) 8.6-50 MG tablet Take 2 tablets by mouth Nightly.     Marland Kitchen amLODipine (NORVASC) 2.5 MG tablet Take 1 tablet (2.5 mg total) by mouth daily. 90 tablet 3   No current facility-administered medications for this visit.    Objective: BP 138/80 (BP Location: Left Arm, Patient Position: Sitting, Cuff Size: Large)   Pulse 64   Temp 97.9 F (36.6 C) (Oral)   Ht 5\' 11"  (1.803 m)   Wt 236 lb 3.2 oz (107.1 kg)   BMI 32.94 kg/m  Gen: NAD, resting comfortably CV: RRR no murmurs rubs or gallops Lungs: CTAB no  crackles, wheeze, rhonchi Abdomen: soft/nontender/nondistended/normal bowel sounds. Ext: 1+ edema Skin: warm, dry  Assessment/Plan:  Other notes: 1. fast growing cataracts as result of the chemo. Going to touch base with transplant doctor and touch base in 3 months and likely will do both within a year.  2.  Off folic acid. He is going to take a MV with folic acid now.   Essential hypertension S: controlled in office on atenolol 50mg . Home #s frequently above 140 or even 150- sometimes 160 BP Readings from Last 3 Encounters:  02/21/18 138/80  10/10/17 119/68  08/06/17 124/80  A/P: We discussed blood pressure goal of <140/90. Continue current meds:  But add amlodipine 2.5mg . Has some edema so will have to watch that- asked him to update me in 2-3 weeks on mychart.     Steroid-induced hyperglycemia S: thrilled with sugar improvement- even off insulin for last few weeks- All sugars under 160. Last insulin was discharge from hospital  A/P: he will continue to monitor CBGs but thrilled with improvement.    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 Continues to follow with [facility]- Still on antiviral and antifungal and on erythromycin 3x a week 250mg . Also on singulair, flovent and albuterol to try to prevent lung issues GVH related. He is off tacrolimus  Hyperlipidemia No statins yet due to strain on liver with some of his current meds per oncology- antifungal which is hard on liver. We did discuss starting aspirin 81mg  back since cant start statin yet- and may consider statin and stopping aspirin in future  Deep vein thrombosis (DVT) of femoral vein of right lower extremity (HCC) Now off lovenox- scans on legs were clear. Will change to history at next visit if no recurrence.    Future Appointments  Date Time Provider [place]  09/25/2018  8:15 AM [name], MD [facility]    Meds ordered this encounter   Medications  . amLODipine (NORVASC) 2.5 MG tablet    Sig: Take 1 tablet (2.5 mg total) by mouth daily.    Dispense:  90 tablet    Refill:  3    Return precautions advised.  Garret Reddish, MD

## 2018-02-21 NOTE — Patient Instructions (Addendum)
Lets continue aspirin 81mg  since cant start statin yet- and may consider statin and stopping aspirin in future  I would start multivitamin if ok with [facility]   Continue atenolol 50mg , start amlodipine 2.5mg . Watch swelling and update me with BPs in 2-3 weeks. If you get lightheaded/dizzy leg me know right away

## 2018-02-22 ENCOUNTER — Encounter: Payer: Self-pay | Admitting: Family Medicine

## 2018-02-22 NOTE — Assessment & Plan Note (Signed)
S: thrilled with sugar improvement- even off insulin for last few weeks- All sugars under 160. Last insulin was discharge from hospital  A/P: he will continue to monitor CBGs but thrilled with improvement.

## 2018-02-22 NOTE — Assessment & Plan Note (Addendum)
MDS now 21 (almost 22 months) months s/p bone marrow transplant. Continues to follow with [facility]- Still on antiviral and antifungal and on erythromycin 3x a week 250mg . Also on singulair, flovent and albuterol to try to prevent lung issues GVH related. He is off tacrolimus

## 2018-02-22 NOTE — Assessment & Plan Note (Signed)
S: controlled in office on atenolol 50mg . Home #s frequently above 140 or even 150- sometimes 160 BP Readings from Last 3 Encounters:  02/21/18 138/80  10/10/17 119/68  08/06/17 124/80  A/P: We discussed blood pressure goal of <140/90. Continue current meds:  But add amlodipine 2.5mg . Has some edema so will have to watch that- asked him to update me in 2-3 weeks on mychart.

## 2018-02-22 NOTE — Assessment & Plan Note (Signed)
No statins yet due to strain on liver with some of his current meds per oncology- antifungal which is hard on liver. We did discuss starting aspirin 81mg  back since cant start statin yet- and may consider statin and stopping aspirin in future

## 2018-02-22 NOTE — Assessment & Plan Note (Signed)
Now off lovenox- scans on legs were clear. Will change to history at next visit if no recurrence.

## 2018-03-05 LAB — BASIC METABOLIC PANEL
BUN: 18 (ref 4–21)
CREATININE: 1.1 (ref 0.6–1.3)
Glucose: 135
POTASSIUM: 4.3 (ref 3.4–5.3)
SODIUM: 136 — AB (ref 137–147)

## 2018-03-05 LAB — HEPATIC FUNCTION PANEL
ALT: 43 — AB (ref 10–40)
AST: 33 (ref 14–40)
Bilirubin, Total: 0.7

## 2018-03-05 LAB — CBC AND DIFFERENTIAL
HCT: 42 (ref 41–53)
Hemoglobin: 13.8 (ref 13.5–17.5)
Platelets: 250 (ref 150–399)
WBC: 8.8

## 2018-03-12 ENCOUNTER — Encounter: Payer: Self-pay | Admitting: Family Medicine

## 2018-04-23 LAB — HEPATIC FUNCTION PANEL
ALK PHOS: 130 — AB (ref 25–125)
ALT: 28 (ref 10–40)
AST: 26 (ref 14–40)
Bilirubin, Total: 0.9

## 2018-04-23 LAB — CBC AND DIFFERENTIAL
HCT: 45 (ref 41–53)
HEMOGLOBIN: 14.6 (ref 13.5–17.5)
Platelets: 299 (ref 150–399)
WBC: 9.5

## 2018-04-23 LAB — BASIC METABOLIC PANEL
BUN: 15 (ref 4–21)
Creatinine: 1.4 — AB (ref 0.6–1.3)
Glucose: 137
POTASSIUM: 4.1 (ref 3.4–5.3)
SODIUM: 136 — AB (ref 137–147)

## 2018-04-23 LAB — IRON,TIBC AND FERRITIN PANEL: FERRITIN: 96

## 2018-04-30 ENCOUNTER — Telehealth: Payer: Self-pay | Admitting: Hematology and Oncology

## 2018-04-30 NOTE — Telephone Encounter (Signed)
Scheduled appt per 11/6 sch message - pt Is aware of appt

## 2018-05-01 ENCOUNTER — Encounter: Payer: Self-pay | Admitting: Family Medicine

## 2018-05-08 ENCOUNTER — Other Ambulatory Visit: Payer: Self-pay

## 2018-05-08 DIAGNOSIS — D469 Myelodysplastic syndrome, unspecified: Secondary | ICD-10-CM

## 2018-05-09 ENCOUNTER — Inpatient Hospital Stay: Payer: Medicare Other | Attending: Hematology and Oncology

## 2018-05-09 DIAGNOSIS — D4621 Refractory anemia with excess of blasts 1: Secondary | ICD-10-CM | POA: Diagnosis not present

## 2018-05-09 DIAGNOSIS — D469 Myelodysplastic syndrome, unspecified: Secondary | ICD-10-CM

## 2018-05-09 LAB — CBC WITH DIFFERENTIAL (CANCER CENTER ONLY)
Abs Immature Granulocytes: 0.05 10*3/uL (ref 0.00–0.07)
Basophils Absolute: 0.1 10*3/uL (ref 0.0–0.1)
Basophils Relative: 1 %
EOS PCT: 10 %
Eosinophils Absolute: 0.8 10*3/uL — ABNORMAL HIGH (ref 0.0–0.5)
HEMATOCRIT: 43.9 % (ref 39.0–52.0)
HEMOGLOBIN: 14.6 g/dL (ref 13.0–17.0)
Immature Granulocytes: 1 %
LYMPHS ABS: 2 10*3/uL (ref 0.7–4.0)
Lymphocytes Relative: 25 %
MCH: 30.5 pg (ref 26.0–34.0)
MCHC: 33.3 g/dL (ref 30.0–36.0)
MCV: 91.8 fL (ref 80.0–100.0)
MONO ABS: 1 10*3/uL (ref 0.1–1.0)
MONOS PCT: 13 %
NEUTROS ABS: 4 10*3/uL (ref 1.7–7.7)
Neutrophils Relative %: 50 %
Platelet Count: 262 10*3/uL (ref 150–400)
RBC: 4.78 MIL/uL (ref 4.22–5.81)
RDW: 15.5 % (ref 11.5–15.5)
WBC Count: 8 10*3/uL (ref 4.0–10.5)
nRBC: 0 % (ref 0.0–0.2)

## 2018-05-09 LAB — CMP (CANCER CENTER ONLY)
ALK PHOS: 132 U/L — AB (ref 38–126)
ALT: 28 U/L (ref 0–44)
AST: 22 U/L (ref 15–41)
Albumin: 3.3 g/dL — ABNORMAL LOW (ref 3.5–5.0)
Anion gap: 7 (ref 5–15)
BILIRUBIN TOTAL: 0.6 mg/dL (ref 0.3–1.2)
BUN: 13 mg/dL (ref 8–23)
CALCIUM: 9.5 mg/dL (ref 8.9–10.3)
CO2: 26 mmol/L (ref 22–32)
CREATININE: 1.22 mg/dL (ref 0.61–1.24)
Chloride: 106 mmol/L (ref 98–111)
GFR, EST NON AFRICAN AMERICAN: 59 mL/min — AB (ref >60)
Glucose, Bld: 107 mg/dL — ABNORMAL HIGH (ref 70–99)
Potassium: 4.3 mmol/L (ref 3.5–5.1)
Sodium: 139 mmol/L (ref 135–145)
TOTAL PROTEIN: 6.5 g/dL (ref 6.5–8.1)

## 2018-05-09 LAB — MAGNESIUM: Magnesium: 1.8 mg/dL (ref 1.7–2.4)

## 2018-05-11 LAB — TACROLIMUS LEVEL: Tacrolimus (FK506) - LabCorp: NOT DETECTED ng/mL (ref 2.0–20.0)

## 2018-05-28 LAB — HEPATIC FUNCTION PANEL
ALT: 25 (ref 10–40)
AST: 22 (ref 14–40)
Alkaline Phosphatase: 104 (ref 25–125)
Bilirubin, Total: 1

## 2018-05-28 LAB — CBC AND DIFFERENTIAL
HCT: 43 (ref 41–53)
Hemoglobin: 14.1 (ref 13.5–17.5)
Platelets: 303 (ref 150–399)
WBC: 9.1

## 2018-05-28 LAB — BASIC METABOLIC PANEL
BUN: 14 (ref 4–21)
Creatinine: 1.2 (ref 0.6–1.3)
Glucose: 114
POTASSIUM: 3.8 (ref 3.4–5.3)
Sodium: 135 — AB (ref 137–147)

## 2018-06-02 ENCOUNTER — Encounter: Payer: Self-pay | Admitting: Family Medicine

## 2018-06-25 HISTORY — PX: CATARACT EXTRACTION: SUR2

## 2018-06-27 ENCOUNTER — Telehealth: Payer: Self-pay

## 2018-06-27 ENCOUNTER — Other Ambulatory Visit: Payer: Self-pay

## 2018-06-27 DIAGNOSIS — Z9484 Stem cells transplant status: Secondary | ICD-10-CM

## 2018-06-27 NOTE — Telephone Encounter (Signed)
Left message for patient to call back to scheduled lab appointment.

## 2018-07-08 ENCOUNTER — Inpatient Hospital Stay: Payer: Medicare Other | Attending: Hematology and Oncology

## 2018-07-08 DIAGNOSIS — D4621 Refractory anemia with excess of blasts 1: Secondary | ICD-10-CM | POA: Diagnosis not present

## 2018-07-08 DIAGNOSIS — D469 Myelodysplastic syndrome, unspecified: Secondary | ICD-10-CM

## 2018-07-08 DIAGNOSIS — Z9484 Stem cells transplant status: Secondary | ICD-10-CM

## 2018-07-08 LAB — CMP (CANCER CENTER ONLY)
ALT: 23 U/L (ref 0–44)
AST: 22 U/L (ref 15–41)
Albumin: 3.4 g/dL — ABNORMAL LOW (ref 3.5–5.0)
Alkaline Phosphatase: 123 U/L (ref 38–126)
Anion gap: 8 (ref 5–15)
BILIRUBIN TOTAL: 0.7 mg/dL (ref 0.3–1.2)
BUN: 13 mg/dL (ref 8–23)
CO2: 25 mmol/L (ref 22–32)
Calcium: 9.2 mg/dL (ref 8.9–10.3)
Chloride: 106 mmol/L (ref 98–111)
Creatinine: 1.18 mg/dL (ref 0.61–1.24)
GFR, Estimated: >60 mL/min (ref >60)
Glucose, Bld: 102 mg/dL — ABNORMAL HIGH (ref 70–99)
POTASSIUM: 4.1 mmol/L (ref 3.5–5.1)
Sodium: 139 mmol/L (ref 135–145)
Total Protein: 6.6 g/dL (ref 6.5–8.1)

## 2018-07-08 LAB — CBC WITH DIFFERENTIAL (CANCER CENTER ONLY)
Abs Immature Granulocytes: 0.02 10*3/uL (ref 0.00–0.07)
BASOS ABS: 0.1 10*3/uL (ref 0.0–0.1)
Basophils Relative: 1 %
Eosinophils Absolute: 0.9 10*3/uL — ABNORMAL HIGH (ref 0.0–0.5)
Eosinophils Relative: 13 %
HCT: 42.4 % (ref 39.0–52.0)
Hemoglobin: 14.1 g/dL (ref 13.0–17.0)
Immature Granulocytes: 0 %
Lymphocytes Relative: 18 %
Lymphs Abs: 1.2 10*3/uL (ref 0.7–4.0)
MCH: 30.7 pg (ref 26.0–34.0)
MCHC: 33.3 g/dL (ref 30.0–36.0)
MCV: 92.4 fL (ref 80.0–100.0)
Monocytes Absolute: 0.8 10*3/uL (ref 0.1–1.0)
Monocytes Relative: 12 %
NRBC: 0 % (ref 0.0–0.2)
Neutro Abs: 4 10*3/uL (ref 1.7–7.7)
Neutrophils Relative %: 56 %
PLATELETS: 256 10*3/uL (ref 150–400)
RBC: 4.59 MIL/uL (ref 4.22–5.81)
RDW: 14.9 % (ref 11.5–15.5)
WBC Count: 7 10*3/uL (ref 4.0–10.5)

## 2018-07-08 LAB — CORTISOL: Cortisol, Plasma: 14.3 ug/dL

## 2018-07-08 LAB — MAGNESIUM: Magnesium: 1.8 mg/dL (ref 1.7–2.4)

## 2018-07-09 LAB — ACTH: C206 ACTH: 12.8 pg/mL (ref 7.2–63.3)

## 2018-07-11 LAB — TACROLIMUS LEVEL: Tacrolimus (FK506) - LabCorp: NOT DETECTED ng/mL (ref 2.0–20.0)

## 2018-08-18 ENCOUNTER — Inpatient Hospital Stay: Payer: Medicare Other | Attending: Hematology and Oncology

## 2018-08-18 DIAGNOSIS — D469 Myelodysplastic syndrome, unspecified: Secondary | ICD-10-CM

## 2018-08-18 DIAGNOSIS — D4621 Refractory anemia with excess of blasts 1: Secondary | ICD-10-CM | POA: Diagnosis present

## 2018-08-18 DIAGNOSIS — Z9484 Stem cells transplant status: Secondary | ICD-10-CM

## 2018-08-18 LAB — CBC WITH DIFFERENTIAL (CANCER CENTER ONLY)
Abs Immature Granulocytes: 0.02 10*3/uL (ref 0.00–0.07)
Basophils Absolute: 0.1 10*3/uL (ref 0.0–0.1)
Basophils Relative: 1 %
EOS ABS: 0.9 10*3/uL — AB (ref 0.0–0.5)
Eosinophils Relative: 12 %
HCT: 42.5 % (ref 39.0–52.0)
Hemoglobin: 14.3 g/dL (ref 13.0–17.0)
IMMATURE GRANULOCYTES: 0 %
Lymphocytes Relative: 21 %
Lymphs Abs: 1.6 10*3/uL (ref 0.7–4.0)
MCH: 30.7 pg (ref 26.0–34.0)
MCHC: 33.6 g/dL (ref 30.0–36.0)
MCV: 91.2 fL (ref 80.0–100.0)
Monocytes Absolute: 0.9 10*3/uL (ref 0.1–1.0)
Monocytes Relative: 12 %
Neutro Abs: 4.2 10*3/uL (ref 1.7–7.7)
Neutrophils Relative %: 54 %
Platelet Count: 275 10*3/uL (ref 150–400)
RBC: 4.66 MIL/uL (ref 4.22–5.81)
RDW: 14.6 % (ref 11.5–15.5)
WBC Count: 7.8 10*3/uL (ref 4.0–10.5)
nRBC: 0 % (ref 0.0–0.2)

## 2018-08-18 LAB — CMP (CANCER CENTER ONLY)
ALT: 22 U/L (ref 0–44)
AST: 23 U/L (ref 15–41)
Albumin: 3.5 g/dL (ref 3.5–5.0)
Alkaline Phosphatase: 112 U/L (ref 38–126)
Anion gap: 8 (ref 5–15)
BILIRUBIN TOTAL: 0.6 mg/dL (ref 0.3–1.2)
BUN: 13 mg/dL (ref 8–23)
CO2: 23 mmol/L (ref 22–32)
Calcium: 9.2 mg/dL (ref 8.9–10.3)
Chloride: 106 mmol/L (ref 98–111)
Creatinine: 1.15 mg/dL (ref 0.61–1.24)
GFR, Est AFR Am: >60 mL/min (ref >60)
GFR, Estimated: >60 mL/min (ref >60)
Glucose, Bld: 103 mg/dL — ABNORMAL HIGH (ref 70–99)
Potassium: 4 mmol/L (ref 3.5–5.1)
Sodium: 137 mmol/L (ref 135–145)
Total Protein: 6.8 g/dL (ref 6.5–8.1)

## 2018-08-18 LAB — MAGNESIUM: Magnesium: 1.8 mg/dL (ref 1.7–2.4)

## 2018-08-18 LAB — CORTISOL: Cortisol, Plasma: 4.6 ug/dL

## 2018-08-20 LAB — ACTH: C206 ACTH: 19.8 pg/mL (ref 7.2–63.3)

## 2018-08-20 LAB — TACROLIMUS LEVEL: Tacrolimus (FK506) - LabCorp: NOT DETECTED ng/mL (ref 2.0–20.0)

## 2018-08-22 LAB — HEMOGLOBIN A1C: Hemoglobin A1C: 6.5

## 2018-08-28 ENCOUNTER — Other Ambulatory Visit: Payer: Self-pay

## 2018-08-28 DIAGNOSIS — D469 Myelodysplastic syndrome, unspecified: Secondary | ICD-10-CM

## 2018-08-28 NOTE — Progress Notes (Signed)
Pt called to request lab work to be done at the cancer center that his transplant dr order to be done in 4 and 8 weeks. Pt get cortisol level and cbc/cmp done. Scheduled lab appt for 3/31 and 4/24. Pt appreciative of time.

## 2018-09-23 ENCOUNTER — Other Ambulatory Visit: Payer: Medicare Other

## 2018-09-24 ENCOUNTER — Encounter: Payer: Self-pay | Admitting: Family Medicine

## 2018-09-24 NOTE — Progress Notes (Signed)
Phone [phone]   Subjective:  Virtual visit via Video note  Our team/I connected with Elisabeth Most on 09/25/18 at  8:20 AM EDT by a video enabled telemedicine application (webex) and verified that I am speaking with the correct person using two identifiers.  Location patient: [facility] Location provider: [facility], office Persons participating in the virtual visit:  patient  Our team/I discussed the limitations of evaluation and management by telemedicine and the availability of in person appointments. In light of current covid-19 pandemic, patient also understands that we are trying to protect them by minimizing in office contact if at all possible.  The patient expressed consent for telemedicine visit and agreed to proceed.   ROS- no fever, chills. Still with some fatigue. No cough reported.    Past Medical History-  Patient Active Problem List   Diagnosis Date Noted  . Adrenocortical insufficiency ([place]) 08/06/2017    Priority: High  . 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 MDS (myelodysplastic syndrome) ([place]) 12/06/2015    Priority: High  . Steroid-induced hyperglycemia 08/16/2014    Priority: High  . Post chemotherapy Dry eyes due to decreased tear production 08/06/2017    Priority: Medium  . post chemotherapy Peripheral neuropathy 08/06/2017    Priority: Medium  . BPH associated with nocturia 11/15/2014    Priority: Medium  . Hyperlipidemia 02/04/2007    Priority: Medium  . Essential hypertension 02/04/2007    Priority: Medium  . History of stem cell transplant ([place]) 03/04/2017    Priority: Low  . Deep vein thrombosis (DVT) of femoral vein of right lower extremity ([place]) 03/04/2017    Priority: Low  . Leukopenia 11/15/2014    Priority: Low  . Chest pain 08/13/2014    Priority: Low  . GERD (gastroesophageal reflux disease) 05/03/2014    Priority: Low  . Cervical disc disorder with radiculopathy of cervical region 07/14/2010   Priority: Low  . MIXED HEARING LOSS BILATERAL 07/14/2010    Priority: Low  . ERECTILE DYSFUNCTION 02/07/2007    Priority: Low  . History of DVT (deep vein thrombosis) 09/25/2018    Medications- reviewed and updated Current Outpatient Medications  Medication Sig Dispense Refill  . acyclovir (ZOVIRAX) 400 MG tablet Take 400 mg by mouth 2 (two) times daily.    Marland Kitchen albuterol (PROVENTIL HFA;VENTOLIN HFA) 108 (90 Base) MCG/ACT inhaler Inhale into the lungs every 6 (six) hours as needed for wheezing or shortness of breath.    Marland Kitchen amLODipine (NORVASC) 2.5 MG tablet Take 1 tablet (2.5 mg total) by mouth daily. 90 tablet 3  . aspirin 81 MG chewable tablet Chew by mouth daily.    Marland Kitchen atenolol (TENORMIN) 50 MG tablet Take 0.5 tablets (25 mg total) by mouth every evening.    . cholecalciferol (VITAMIN D) 1000 units tablet Take 2,000 Units by mouth daily.     Marland Kitchen erythromycin (E-MYCIN) 250 MG tablet Take 250 mg by mouth.    . fluticasone (FLOVENT HFA) 220 MCG/ACT inhaler Inhale into the lungs 2 (two) times daily.    . hydrocortisone (CORTEF) 10 MG tablet Take 5 tablets in AM and 3 tablet in afternoon thru 1/28. Then Take 4 tablets in AM and 3 tablets in afternoon starting 1/29 120 tablet 1  . loratadine (CLARITIN) 10 MG tablet Take 10 mg by mouth daily.    . Magnesium Oxide 400 MG CAPS Take 1 capsule (400 mg total) by mouth 5 (five) times daily.    Marland Kitchen  Melatonin 3 MG TABS Take by mouth. 1-3 tabs as needed    . montelukast (SINGULAIR) 10 MG tablet Take 10 mg by mouth at bedtime.    Marland Kitchen omeprazole (PRILOSEC OTC) 20 MG tablet Take 2 tablets (40 mg total) by mouth 2 (two) times daily. take 1 tablet by mouth once daily (Patient taking differently: Take 40 mg by mouth 2 (two) times daily. )    . polyethylene glycol (MIRALAX / GLYCOLAX) packet Take 17 g by mouth daily as needed for mild constipation or moderate constipation.     . posaconazole (NOXAFIL) 100 MG TBEC delayed-release tablet Take 300 mg by mouth daily.    Marland Kitchen  senna-docusate (SENOKOT-S) 8.6-50 MG tablet Take 2 tablets by mouth Nightly.        Objective:  BP 124/82   Pulse 61   Ht 5\' 11"  (4.259 m)   Wt 225 lb (102.1 kg)   BMI 31.38 kg/m  Gen: NAD, resting comfortably Lungs: nonlabored, normal respiratory rate  Skin: warm, dry, no obvious rash Normal speech    Assessment and Plan   Other notes: 1.catract removed from left eye in January- now waiting on reschedule for the right eye.  2. Had sleep study done- minor/mild sleep apnea- no treatment advised  3.  Patient seeing pulmonary- transplant/chemo damage to lungs- on albuterola nd flovent as well as singular and azithromycin.  4. Dealing with neuropathy after DVT and chemotherapy 5. Derm visti pushed from April to august- no concerns 6. Will be trying to transition back to me after seeing Dr. Avanell Shackleton- a cancer survivor generalist.  7. From patient "Was scheduled to see Dr Avanell Shackleton, generalist in [facility], to help transition back to you. That's been pushed out too. They would like a cc of last colonoscopy results faxed [phone]. " we are going to fax this today  #hypertension S: controlled on atenolol 25mg  and amlodipine 10mg --> 2.5 mg BP Readings from Last 3 Encounters:  09/25/18 124/82  02/21/18 138/80  10/10/17 119/68  A/P: Stable. Continue current medications.   #hyperlipidemia S: unknown controlled on no meds. Has been off simvastatin for several years now.  - planning on getting labs at [facility]- but may be august.  Lab Results  Component Value Date   CHOL 146 08/10/2014   HDL 43.10 08/10/2014   LDLCALC 79 08/10/2014   LDLDIRECT 92.0 05/04/2015   TRIG 122.0 08/10/2014   CHOLHDL 3 08/10/2014   A/P: we are hoping to get lipid panel sometime in the next 6 months if covid 19 in better position- but certainly do not want patient on immunosuppressants to come into office at this time.   # MDS (myelodysplastic syndrome) ([place]), Chronic GVHD (graft versus host  disease) ([place]), Chronic Adrenocortical insufficiency ([place]), Chronic S: patient has done very well despite multiple complications after bone marrow transplant.  He continues to follow-up with [facility] and multiple specialist given graft-versus-host disease.  He continues to follow-up with endocrine due to adrenal insufficiency and history of steroid-induced hyperglycemia. A/P: Stable-continue close follow-up with [facility]  Steroid-induced hyperglycemia S:  Has lost 20 lbs he says - intentionally trying to do better- long term goal under 200. Some of this was getting off steroids and reducing swelling  Has visit with endocinology of [facility] pushed back- trying to get him off hydrocortisone which he is down to 10mg /day. He is not on insulin and spot checking glucose. Not on any treatment A/P: Patient doing well- hopeful to get a1c within 6 months along  with urine microalbumin- may take urine microalbumin off if a1c returns to normal range off medication over next year   History of DVT (deep vein thrombosis) R femoral during cancer treatment- was able to come off NOAC without recurrence- continue to monitor.    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  Garret Reddish, MD

## 2018-09-25 ENCOUNTER — Ambulatory Visit (INDEPENDENT_AMBULATORY_CARE_PROVIDER_SITE_OTHER): Payer: Medicare Other | Admitting: Family Medicine

## 2018-09-25 ENCOUNTER — Encounter: Payer: Self-pay | Admitting: Family Medicine

## 2018-09-25 VITALS — BP 124/82 | HR 61 | Ht 71.0 in | Wt 225.0 lb

## 2018-09-25 DIAGNOSIS — E2740 Unspecified adrenocortical insufficiency: Secondary | ICD-10-CM

## 2018-09-25 DIAGNOSIS — R739 Hyperglycemia, unspecified: Secondary | ICD-10-CM | POA: Diagnosis not present

## 2018-09-25 DIAGNOSIS — D469 Myelodysplastic syndrome, unspecified: Secondary | ICD-10-CM | POA: Diagnosis not present

## 2018-09-25 DIAGNOSIS — E785 Hyperlipidemia, unspecified: Secondary | ICD-10-CM

## 2018-09-25 DIAGNOSIS — Z86718 Personal history of other venous thrombosis and embolism: Secondary | ICD-10-CM

## 2018-09-25 DIAGNOSIS — T380X5A Adverse effect of glucocorticoids and synthetic analogues, initial encounter: Secondary | ICD-10-CM

## 2018-09-25 DIAGNOSIS — I10 Essential (primary) hypertension: Secondary | ICD-10-CM

## 2018-09-25 DIAGNOSIS — D89813 Graft-versus-host disease, unspecified: Secondary | ICD-10-CM

## 2018-09-25 NOTE — Patient Instructions (Addendum)
Health Maintenance Due  Topic Date Due  . URINE MICROALBUMIN - future visit 08/26/1960   Video visit

## 2018-09-25 NOTE — Assessment & Plan Note (Signed)
R femoral during cancer treatment- was able to come off NOAC without recurrence- continue to monitor.

## 2018-09-25 NOTE — Assessment & Plan Note (Signed)
S:  Has lost 20 lbs he says - intentionally trying to do better- long term goal under 200. Some of this was getting off steroids and reducing swelling  Has visit with endocinology of [facility] pushed back- trying to get him off hydrocortisone which he is down to 10mg /day. He is not on insulin and spot checking glucose. Not on any treatment A/P: Patient doing well- hopeful to get a1c within 6 months along with urine microalbumin- may take urine microalbumin off if a1c returns to normal range off medication over next year

## 2018-10-15 ENCOUNTER — Other Ambulatory Visit: Payer: Self-pay | Admitting: *Deleted

## 2018-10-15 DIAGNOSIS — E785 Hyperlipidemia, unspecified: Secondary | ICD-10-CM

## 2018-10-15 DIAGNOSIS — D469 Myelodysplastic syndrome, unspecified: Secondary | ICD-10-CM

## 2018-10-17 ENCOUNTER — Other Ambulatory Visit: Payer: Self-pay

## 2018-10-17 ENCOUNTER — Inpatient Hospital Stay: Payer: Medicare Other | Attending: Hematology and Oncology

## 2018-10-17 DIAGNOSIS — Z79899 Other long term (current) drug therapy: Secondary | ICD-10-CM | POA: Diagnosis not present

## 2018-10-17 DIAGNOSIS — E785 Hyperlipidemia, unspecified: Secondary | ICD-10-CM

## 2018-10-17 DIAGNOSIS — D469 Myelodysplastic syndrome, unspecified: Secondary | ICD-10-CM

## 2018-10-17 DIAGNOSIS — D4621 Refractory anemia with excess of blasts 1: Secondary | ICD-10-CM | POA: Insufficient documentation

## 2018-10-17 LAB — CMP (CANCER CENTER ONLY)
ALT: 27 U/L (ref 0–44)
AST: 24 U/L (ref 15–41)
Albumin: 3.4 g/dL — ABNORMAL LOW (ref 3.5–5.0)
Alkaline Phosphatase: 140 U/L — ABNORMAL HIGH (ref 38–126)
Anion gap: 10 (ref 5–15)
BUN: 10 mg/dL (ref 8–23)
CO2: 23 mmol/L (ref 22–32)
Calcium: 9.1 mg/dL (ref 8.9–10.3)
Chloride: 106 mmol/L (ref 98–111)
Creatinine: 1.13 mg/dL (ref 0.61–1.24)
GFR, Est AFR Am: >60 mL/min (ref >60)
GFR, Estimated: >60 mL/min (ref >60)
Glucose, Bld: 99 mg/dL (ref 70–99)
Potassium: 4.2 mmol/L (ref 3.5–5.1)
Sodium: 139 mmol/L (ref 135–145)
Total Bilirubin: 0.7 mg/dL (ref 0.3–1.2)
Total Protein: 6.8 g/dL (ref 6.5–8.1)

## 2018-10-17 LAB — LIPID PANEL
Cholesterol: 244 mg/dL — ABNORMAL HIGH (ref 0–200)
HDL: 43 mg/dL (ref >40)
LDL Cholesterol: 145 mg/dL — ABNORMAL HIGH (ref 0–99)
Total CHOL/HDL Ratio: 5.7 RATIO
Triglycerides: 282 mg/dL — ABNORMAL HIGH (ref <150)
VLDL: 56 mg/dL — ABNORMAL HIGH (ref 0–40)

## 2018-10-17 LAB — CBC WITH DIFFERENTIAL (CANCER CENTER ONLY)
Abs Immature Granulocytes: 0.03 10*3/uL (ref 0.00–0.07)
Basophils Absolute: 0.1 10*3/uL (ref 0.0–0.1)
Basophils Relative: 1 %
Eosinophils Absolute: 0.8 10*3/uL — ABNORMAL HIGH (ref 0.0–0.5)
Eosinophils Relative: 10 %
HCT: 44.4 % (ref 39.0–52.0)
Hemoglobin: 14.9 g/dL (ref 13.0–17.0)
Immature Granulocytes: 0 %
Lymphocytes Relative: 22 %
Lymphs Abs: 1.6 10*3/uL (ref 0.7–4.0)
MCH: 30.5 pg (ref 26.0–34.0)
MCHC: 33.6 g/dL (ref 30.0–36.0)
MCV: 91 fL (ref 80.0–100.0)
Monocytes Absolute: 1 10*3/uL (ref 0.1–1.0)
Monocytes Relative: 13 %
Neutro Abs: 4.1 10*3/uL (ref 1.7–7.7)
Neutrophils Relative %: 54 %
Platelet Count: 270 10*3/uL (ref 150–400)
RBC: 4.88 MIL/uL (ref 4.22–5.81)
RDW: 15.8 % — ABNORMAL HIGH (ref 11.5–15.5)
WBC Count: 7.6 10*3/uL (ref 4.0–10.5)
nRBC: 0 % (ref 0.0–0.2)

## 2018-10-17 LAB — MAGNESIUM: Magnesium: 1.8 mg/dL (ref 1.7–2.4)

## 2018-10-17 LAB — CORTISOL: Cortisol, Plasma: 7.1 ug/dL

## 2018-10-18 LAB — ACTH: C206 ACTH: 54.4 pg/mL (ref 7.2–63.3)

## 2018-10-19 LAB — TACROLIMUS LEVEL: Tacrolimus (FK506) - LabCorp: NOT DETECTED ng/mL (ref 2.0–20.0)

## 2018-10-20 ENCOUNTER — Other Ambulatory Visit: Payer: Self-pay | Admitting: Hematology and Oncology

## 2018-10-20 ENCOUNTER — Encounter: Payer: Self-pay | Admitting: Family Medicine

## 2018-10-20 ENCOUNTER — Other Ambulatory Visit: Payer: Self-pay | Admitting: *Deleted

## 2018-10-20 ENCOUNTER — Encounter: Payer: Self-pay | Admitting: *Deleted

## 2018-10-20 NOTE — Progress Notes (Signed)
Labs from Friday faxed to Dr. [name] with [facility] per pt request [phone])

## 2018-10-21 ENCOUNTER — Observation Stay (HOSPITAL_COMMUNITY): Payer: Medicare Other

## 2018-10-21 ENCOUNTER — Encounter (HOSPITAL_COMMUNITY): Payer: Self-pay

## 2018-10-21 ENCOUNTER — Emergency Department (HOSPITAL_COMMUNITY): Payer: Medicare Other

## 2018-10-21 ENCOUNTER — Inpatient Hospital Stay (HOSPITAL_COMMUNITY)
Admission: EM | Admit: 2018-10-21 | Discharge: 2018-10-25 | DRG: 871 | Disposition: A | Discharge status: Home / Self Care | Payer: Medicare Other | Attending: Internal Medicine | Admitting: Internal Medicine

## 2018-10-21 ENCOUNTER — Other Ambulatory Visit: Payer: Self-pay

## 2018-10-21 ENCOUNTER — Encounter: Payer: Self-pay | Admitting: Family Medicine

## 2018-10-21 DIAGNOSIS — T380X5A Adverse effect of glucocorticoids and synthetic analogues, initial encounter: Secondary | ICD-10-CM | POA: Diagnosis present

## 2018-10-21 DIAGNOSIS — R109 Unspecified abdominal pain: Secondary | ICD-10-CM | POA: Diagnosis present

## 2018-10-21 DIAGNOSIS — Z9484 Stem cells transplant status: Secondary | ICD-10-CM

## 2018-10-21 DIAGNOSIS — E876 Hypokalemia: Secondary | ICD-10-CM | POA: Diagnosis present

## 2018-10-21 DIAGNOSIS — E872 Acidosis, unspecified: Secondary | ICD-10-CM | POA: Diagnosis present

## 2018-10-21 DIAGNOSIS — R739 Hyperglycemia, unspecified: Secondary | ICD-10-CM | POA: Diagnosis present

## 2018-10-21 DIAGNOSIS — Z7951 Long term (current) use of inhaled steroids: Secondary | ICD-10-CM

## 2018-10-21 DIAGNOSIS — A419 Sepsis, unspecified organism: Principal | ICD-10-CM | POA: Diagnosis present

## 2018-10-21 DIAGNOSIS — J189 Pneumonia, unspecified organism: Secondary | ICD-10-CM | POA: Diagnosis present

## 2018-10-21 DIAGNOSIS — R6520 Severe sepsis without septic shock: Secondary | ICD-10-CM | POA: Diagnosis present

## 2018-10-21 DIAGNOSIS — D89811 Chronic graft-versus-host disease: Secondary | ICD-10-CM | POA: Diagnosis present

## 2018-10-21 DIAGNOSIS — E272 Addisonian crisis: Secondary | ICD-10-CM | POA: Diagnosis present

## 2018-10-21 DIAGNOSIS — R197 Diarrhea, unspecified: Secondary | ICD-10-CM | POA: Diagnosis present

## 2018-10-21 DIAGNOSIS — Z1159 Encounter for screening for other viral diseases: Secondary | ICD-10-CM

## 2018-10-21 DIAGNOSIS — Z7982 Long term (current) use of aspirin: Secondary | ICD-10-CM

## 2018-10-21 DIAGNOSIS — Z20828 Contact with and (suspected) exposure to other viral communicable diseases: Secondary | ICD-10-CM | POA: Diagnosis present

## 2018-10-21 DIAGNOSIS — N179 Acute kidney failure, unspecified: Secondary | ICD-10-CM | POA: Diagnosis present

## 2018-10-21 DIAGNOSIS — Z856 Personal history of leukemia: Secondary | ICD-10-CM

## 2018-10-21 DIAGNOSIS — IMO0001 Reserved for inherently not codable concepts without codable children: Secondary | ICD-10-CM

## 2018-10-21 DIAGNOSIS — Z862 Personal history of diseases of the blood and blood-forming organs and certain disorders involving the immune mechanism: Secondary | ICD-10-CM | POA: Diagnosis present

## 2018-10-21 DIAGNOSIS — E2749 Other adrenocortical insufficiency: Secondary | ICD-10-CM | POA: Diagnosis present

## 2018-10-21 DIAGNOSIS — D469 Myelodysplastic syndrome, unspecified: Secondary | ICD-10-CM | POA: Diagnosis present

## 2018-10-21 DIAGNOSIS — Z820 Family history of epilepsy and other diseases of the nervous system: Secondary | ICD-10-CM

## 2018-10-21 DIAGNOSIS — Y830 Surgical operation with transplant of whole organ as the cause of abnormal reaction of the patient, or of later complication, without mention of misadventure at the time of the procedure: Secondary | ICD-10-CM | POA: Diagnosis present

## 2018-10-21 DIAGNOSIS — E2740 Unspecified adrenocortical insufficiency: Secondary | ICD-10-CM | POA: Diagnosis present

## 2018-10-21 DIAGNOSIS — T865 Complications of stem cell transplant: Secondary | ICD-10-CM | POA: Diagnosis present

## 2018-10-21 DIAGNOSIS — I10 Essential (primary) hypertension: Secondary | ICD-10-CM | POA: Diagnosis present

## 2018-10-21 DIAGNOSIS — J329 Chronic sinusitis, unspecified: Secondary | ICD-10-CM | POA: Diagnosis present

## 2018-10-21 DIAGNOSIS — E860 Dehydration: Secondary | ICD-10-CM | POA: Diagnosis present

## 2018-10-21 DIAGNOSIS — Z20822 Contact with and (suspected) exposure to covid-19: Secondary | ICD-10-CM

## 2018-10-21 DIAGNOSIS — Z803 Family history of malignant neoplasm of breast: Secondary | ICD-10-CM

## 2018-10-21 DIAGNOSIS — Z823 Family history of stroke: Secondary | ICD-10-CM

## 2018-10-21 DIAGNOSIS — D89813 Graft-versus-host disease, unspecified: Secondary | ICD-10-CM | POA: Diagnosis present

## 2018-10-21 HISTORY — DX: Unspecified adrenocortical insufficiency: E27.40

## 2018-10-21 LAB — URINALYSIS, ROUTINE W REFLEX MICROSCOPIC
Bilirubin Urine: NEGATIVE
Glucose, UA: NEGATIVE mg/dL
Hgb urine dipstick: NEGATIVE
Ketones, ur: NEGATIVE mg/dL
Leukocytes,Ua: NEGATIVE
Nitrite: NEGATIVE
Protein, ur: NEGATIVE mg/dL
Specific Gravity, Urine: 1.02 (ref 1.005–1.030)
pH: 5 (ref 5.0–8.0)

## 2018-10-21 LAB — CBC WITH DIFFERENTIAL/PLATELET
Abs Immature Granulocytes: 0.04 10*3/uL (ref 0.00–0.07)
Basophils Absolute: 0 10*3/uL (ref 0.0–0.1)
Basophils Relative: 0 %
Eosinophils Absolute: 0.3 10*3/uL (ref 0.0–0.5)
Eosinophils Relative: 4 %
HCT: 49.9 % (ref 39.0–52.0)
Hemoglobin: 16.5 g/dL (ref 13.0–17.0)
Immature Granulocytes: 0 %
Lymphocytes Relative: 9 %
Lymphs Abs: 0.8 10*3/uL (ref 0.7–4.0)
MCH: 31.2 pg (ref 26.0–34.0)
MCHC: 33.1 g/dL (ref 30.0–36.0)
MCV: 94.3 fL (ref 80.0–100.0)
Monocytes Absolute: 1.8 10*3/uL — ABNORMAL HIGH (ref 0.1–1.0)
Monocytes Relative: 20 %
Neutro Abs: 6.1 10*3/uL (ref 1.7–7.7)
Neutrophils Relative %: 67 %
Platelets: 280 10*3/uL (ref 150–400)
RBC: 5.29 MIL/uL (ref 4.22–5.81)
RDW: 15.9 % — ABNORMAL HIGH (ref 11.5–15.5)
WBC: 9.1 10*3/uL (ref 4.0–10.5)
nRBC: 0 % (ref 0.0–0.2)

## 2018-10-21 LAB — RESPIRATORY PANEL BY PCR

## 2018-10-21 LAB — COMPREHENSIVE METABOLIC PANEL
ALT: 32 U/L (ref 0–44)
AST: 32 U/L (ref 15–41)
Albumin: 3.2 g/dL — ABNORMAL LOW (ref 3.5–5.0)
Alkaline Phosphatase: 108 U/L (ref 38–126)
Anion gap: 11 (ref 5–15)
BUN: 26 mg/dL — ABNORMAL HIGH (ref 8–23)
CO2: 18 mmol/L — ABNORMAL LOW (ref 22–32)
Calcium: 8.6 mg/dL — ABNORMAL LOW (ref 8.9–10.3)
Chloride: 107 mmol/L (ref 98–111)
Creatinine, Ser: 1.8 mg/dL — ABNORMAL HIGH (ref 0.61–1.24)
GFR calc Af Amer: 44 mL/min — ABNORMAL LOW (ref >60)
GFR calc non Af Amer: 38 mL/min — ABNORMAL LOW (ref >60)
Glucose, Bld: 162 mg/dL — ABNORMAL HIGH (ref 70–99)
Potassium: 3.9 mmol/L (ref 3.5–5.1)
Sodium: 136 mmol/L (ref 135–145)
Total Bilirubin: 1.3 mg/dL — ABNORMAL HIGH (ref 0.3–1.2)
Total Protein: 6.4 g/dL — ABNORMAL LOW (ref 6.5–8.1)

## 2018-10-21 LAB — TSH: TSH: 7.447 u[IU]/mL — ABNORMAL HIGH (ref 0.350–4.500)

## 2018-10-21 LAB — MRSA PCR SCREENING: MRSA by PCR: NEGATIVE

## 2018-10-21 LAB — LACTIC ACID, PLASMA
Lactic Acid, Venous: 3.6 mmol/L (ref 0.5–1.9)
Lactic Acid, Venous: 2.5 mmol/L (ref 0.5–1.9)

## 2018-10-21 LAB — TROPONIN I: Troponin I: 0.03 ng/mL (ref <0.03)

## 2018-10-21 LAB — CORTISOL: Cortisol, Plasma: 32.2 ug/dL

## 2018-10-21 LAB — CBG MONITORING, ED: Glucose-Capillary: 148 mg/dL — ABNORMAL HIGH (ref 70–99)

## 2018-10-21 LAB — SARS CORONAVIRUS 2 BY RT PCR (HOSPITAL ORDER, PERFORMED IN ~~LOC~~ HOSPITAL LAB): SARS Coronavirus 2: NEGATIVE

## 2018-10-21 MED ORDER — ENOXAPARIN SODIUM 40 MG/0.4ML ~~LOC~~ SOLN
40.0000 mg | SUBCUTANEOUS | Status: DC
  Administered 2018-10-21 – 2018-10-24 (×4): 40 mg via SUBCUTANEOUS
  Filled 2018-10-21 (×4): qty 0.4

## 2018-10-21 MED ORDER — ONDANSETRON HCL 4 MG PO TABS
4.0000 mg | ORAL_TABLET | Freq: Four times a day (QID) | ORAL | Status: DC | PRN
  Administered 2018-10-22 – 2018-10-24 (×2): 4 mg via ORAL
  Filled 2018-10-21 (×2): qty 1

## 2018-10-21 MED ORDER — METRONIDAZOLE IN NACL 5-0.79 MG/ML-% IV SOLN
500.0000 mg | Freq: Three times a day (TID) | INTRAVENOUS | Status: DC
  Administered 2018-10-21 – 2018-10-24 (×8): 500 mg via INTRAVENOUS
  Filled 2018-10-21 (×8): qty 100

## 2018-10-21 MED ORDER — MONTELUKAST SODIUM 10 MG PO TABS
10.0000 mg | ORAL_TABLET | Freq: Every day | ORAL | Status: DC
  Administered 2018-10-21 – 2018-10-24 (×4): 10 mg via ORAL
  Filled 2018-10-21 (×4): qty 1

## 2018-10-21 MED ORDER — ONDANSETRON HCL 4 MG/2ML IJ SOLN
4.0000 mg | Freq: Once | INTRAMUSCULAR | Status: AC
  Administered 2018-10-21: 4 mg via INTRAVENOUS
  Filled 2018-10-21: qty 2

## 2018-10-21 MED ORDER — HYDROCORTISONE NA SUCCINATE PF 100 MG IJ SOLR
100.0000 mg | Freq: Once | INTRAMUSCULAR | Status: AC
  Administered 2018-10-21: 100 mg via INTRAVENOUS
  Filled 2018-10-21: qty 2

## 2018-10-21 MED ORDER — SODIUM CHLORIDE 0.9 % IV BOLUS
1000.0000 mL | Freq: Once | INTRAVENOUS | Status: AC
  Administered 2018-10-21: 1000 mL via INTRAVENOUS

## 2018-10-21 MED ORDER — PANTOPRAZOLE SODIUM 40 MG PO TBEC
40.0000 mg | DELAYED_RELEASE_TABLET | Freq: Every day | ORAL | Status: DC
  Administered 2018-10-21 – 2018-10-25 (×5): 40 mg via ORAL
  Filled 2018-10-21 (×5): qty 1

## 2018-10-21 MED ORDER — SENNOSIDES-DOCUSATE SODIUM 8.6-50 MG PO TABS: 2.0000 | ORAL_TABLET | Freq: Every evening | ORAL | Status: DC

## 2018-10-21 MED ORDER — ACYCLOVIR 400 MG PO TABS
400.0000 mg | ORAL_TABLET | Freq: Two times a day (BID) | ORAL | Status: DC
  Administered 2018-10-21 – 2018-10-25 (×8): 400 mg via ORAL
  Filled 2018-10-21 (×8): qty 1

## 2018-10-21 MED ORDER — SODIUM CHLORIDE 0.9 % IV SOLN
INTRAVENOUS | Status: DC
  Administered 2018-10-21 – 2018-10-22 (×2): via INTRAVENOUS

## 2018-10-21 MED ORDER — HYDROCORTISONE 10 MG PO TABS
10.0000 mg | ORAL_TABLET | Freq: Every morning | ORAL | Status: DC
  Administered 2018-10-22: 08:00:00 10 mg via ORAL
  Filled 2018-10-21: qty 1

## 2018-10-21 MED ORDER — CHLORHEXIDINE GLUCONATE CLOTH 2 % EX PADS: 6.0000 | MEDICATED_PAD | Freq: Every day | CUTANEOUS | Status: DC

## 2018-10-21 MED ORDER — LORATADINE 10 MG PO TABS
10.0000 mg | ORAL_TABLET | Freq: Every day | ORAL | Status: DC
  Administered 2018-10-21 – 2018-10-25 (×5): 10 mg via ORAL
  Filled 2018-10-21 (×5): qty 1

## 2018-10-21 MED ORDER — ONDANSETRON HCL 4 MG/2ML IJ SOLN: 4.0000 mg | Freq: Four times a day (QID) | INTRAMUSCULAR | Status: DC | PRN

## 2018-10-21 MED ORDER — SODIUM CHLORIDE 0.9 % IV SOLN
2.0000 g | Freq: Once | INTRAVENOUS | Status: AC
  Administered 2018-10-21: 2 g via INTRAVENOUS
  Filled 2018-10-21: qty 2

## 2018-10-21 MED ORDER — VANCOMYCIN HCL 10 G IV SOLR
2000.0000 mg | Freq: Once | INTRAVENOUS | Status: AC
  Administered 2018-10-21: 2000 mg via INTRAVENOUS
  Filled 2018-10-21: qty 2000

## 2018-10-21 MED ORDER — MAGNESIUM HYDROXIDE 400 MG/5ML PO SUSP: 30.0000 mL | Freq: Every day | ORAL | Status: DC | PRN

## 2018-10-21 MED ORDER — ACETAMINOPHEN 650 MG RE SUPP: 650.0000 mg | Freq: Four times a day (QID) | RECTAL | Status: DC | PRN

## 2018-10-21 MED ORDER — SENNA 8.6 MG PO TABS
1.0000 | ORAL_TABLET | Freq: Two times a day (BID) | ORAL | Status: DC
  Filled 2018-10-21 (×2): qty 1

## 2018-10-21 MED ORDER — POLYVINYL ALCOHOL 1.4 % OP SOLN
1.0000 [drp] | OPHTHALMIC | Status: DC | PRN
  Administered 2018-10-21 – 2018-10-22 (×2): 1 [drp] via OPHTHALMIC
  Filled 2018-10-21: qty 15

## 2018-10-21 MED ORDER — FLUTICASONE PROPIONATE 50 MCG/ACT NA SUSP
2.0000 | Freq: Every day | NASAL | Status: DC | PRN
  Filled 2018-10-21: qty 16

## 2018-10-21 MED ORDER — ONDANSETRON 4 MG PO TBDP: 8.0000 mg | ORAL_TABLET | Freq: Three times a day (TID) | ORAL | Status: DC | PRN

## 2018-10-21 MED ORDER — FLEET ENEMA 7-19 GM/118ML RE ENEM: 1.0000 | ENEMA | Freq: Once | RECTAL | Status: DC | PRN

## 2018-10-21 MED ORDER — SODIUM CHLORIDE 0.9% FLUSH
3.0000 mL | Freq: Two times a day (BID) | INTRAVENOUS | Status: DC
  Administered 2018-10-21 – 2018-10-24 (×7): 3 mL via INTRAVENOUS

## 2018-10-21 MED ORDER — SORBITOL 70 % SOLN
30.0000 mL | Freq: Every day | Status: DC | PRN
  Filled 2018-10-21: qty 30

## 2018-10-21 MED ORDER — ASPIRIN 81 MG PO CHEW
81.0000 mg | CHEWABLE_TABLET | Freq: Every day | ORAL | Status: DC
  Administered 2018-10-21 – 2018-10-25 (×5): 81 mg via ORAL
  Filled 2018-10-21 (×5): qty 1

## 2018-10-21 MED ORDER — ALBUTEROL SULFATE (2.5 MG/3ML) 0.083% IN NEBU: 2.5000 mg | INHALATION_SOLUTION | Freq: Four times a day (QID) | RESPIRATORY_TRACT | Status: DC | PRN

## 2018-10-21 MED ORDER — VANCOMYCIN HCL 10 G IV SOLR
1250.0000 mg | INTRAVENOUS | Status: DC
  Administered 2018-10-22: 1250 mg via INTRAVENOUS
  Filled 2018-10-21 (×2): qty 1250

## 2018-10-21 MED ORDER — ERYTHROMYCIN BASE 250 MG PO TABS
250.0000 mg | ORAL_TABLET | ORAL | Status: DC
  Administered 2018-10-22 – 2018-10-24 (×2): 250 mg via ORAL
  Filled 2018-10-21 (×2): qty 1

## 2018-10-21 MED ORDER — POSACONAZOLE 100 MG PO TBEC
300.0000 mg | DELAYED_RELEASE_TABLET | Freq: Every day | ORAL | Status: DC
  Administered 2018-10-21 – 2018-10-25 (×5): 300 mg via ORAL
  Filled 2018-10-21 (×5): qty 3

## 2018-10-21 MED ORDER — SODIUM CHLORIDE 0.9 % IV SOLN
2.0000 g | Freq: Two times a day (BID) | INTRAVENOUS | Status: DC
  Administered 2018-10-21 – 2018-10-22 (×2): 2 g via INTRAVENOUS
  Filled 2018-10-21 (×2): qty 2

## 2018-10-21 MED ORDER — METRONIDAZOLE IN NACL 5-0.79 MG/ML-% IV SOLN
500.0000 mg | Freq: Once | INTRAVENOUS | Status: AC
  Administered 2018-10-21: 13:00:00 500 mg via INTRAVENOUS
  Filled 2018-10-21: qty 100

## 2018-10-21 MED ORDER — SODIUM CHLORIDE 0.9 % IV SOLN
INTRAVENOUS | Status: DC | PRN
  Administered 2018-10-21: 13:00:00 1000 mL via INTRAVENOUS
  Administered 2018-10-24: 200 mL via INTRAVENOUS

## 2018-10-21 MED ORDER — ACETAMINOPHEN 325 MG PO TABS: 650.0000 mg | ORAL_TABLET | Freq: Four times a day (QID) | ORAL | Status: DC | PRN

## 2018-10-21 NOTE — ED Notes (Signed)
Attempted report x1. 

## 2018-10-21 NOTE — ED Notes (Signed)
ED TO INPATIENT HANDOFF REPORT  ED Nurse Name and Phone #: [phone] Cari, RN  S Name/Age/Gender Elisabeth Most 68 y.o. male Room/Bed: WA23/WA23  Code Status   Code Status: Full Code  Home/SNF/Other Home Patient oriented to: situation Is this baseline? Yes   Triage Complete: Triage complete  Chief Complaint N/V/D / chills   Triage Note Pt from home c/o nausea and chills starting at appx 0130 today.  Denies emesis.  Reports taking chronic steroids, hx of stem cell transplant 03/2016.       Allergies No Known Allergies  Level of Care/Admitting Diagnosis ED Disposition    ED Disposition Condition [facility] Area: [place] [100102]  Level of Care: Stepdown [14]  Admit to SDU based on following criteria: Hemodynamic compromise or significant risk of instability:  Patient requiring short term acute titration and management of vasoactive drips, and invasive monitoring (i.e., CVP and Arterial line).  Covid Evaluation: N/A  Diagnosis: Lactic acidosis [366440]  Admitting Physician: Terrilee Croak [[id number]]  Attending Physician: Terrilee Croak [[id number]]  PT Class (Do Not Modify): Observation [104]  PT Acc Code (Do Not Modify): Observation [10022]       B Medical/Surgery History Past Medical History:  Diagnosis Date  . Allergy    seasonal/animals  . Chronic prostatitis 12/17/2008  . Diverticulosis 01/16/2011   History of diverticulitis   . ERECTILE DYSFUNCTION 02/07/2007  . HYPERLIPIDEMIA 02/04/2007  . HYPERTENSION 02/04/2007  . NEOPLASM, MALIGNANT, SKIN, BACK 02/09/2009  . Pneumonia, viral 12/2017  . S/P bone marrow transplant [facility])    Past Surgical History:  Procedure Laterality Date  . CATARACT EXTRACTION Left 06/2018  . none       A IV Location/Drains/Wounds Patient Lines/Drains/Airways Status   Active Line/Drains/Airways    Name:   Placement date:   Placement time:   Site:   Days:   Peripheral IV 10/21/18 Left Antecubital    10/21/18    1013    Antecubital   less than 1   Peripheral IV 10/21/18 Right Antecubital   10/21/18    1126    Antecubital   less than 1   PICC Double Lumen 12/22/15 PICC Left   12/22/15    -     1034          Intake/Output Last 24 hours  Intake/Output Summary (Last 24 hours) at 10/21/2018 1719 Last data filed at 10/21/2018 1710 Gross per 24 hour  Intake 4700 ml  Output -  Net 4700 ml    Labs/Imaging Results for orders placed or performed during the hospital encounter of 10/21/18 (from the past 48 hour(s))  CBG monitoring, ED     Status: Abnormal   Collection Time: 10/21/18 10:03 AM  Result Value Ref Range   Glucose-Capillary 148 (H) 70 - 99 mg/dL  Comprehensive metabolic panel     Status: Abnormal   Collection Time: 10/21/18 10:20 AM  Result Value Ref Range   Sodium 136 135 - 145 mmol/L   Potassium 3.9 3.5 - 5.1 mmol/L   Chloride 107 98 - 111 mmol/L   CO2 18 (L) 22 - 32 mmol/L   Glucose, Bld 162 (H) 70 - 99 mg/dL   BUN 26 (H) 8 - 23 mg/dL   Creatinine, Ser 1.80 (H) 0.61 - 1.24 mg/dL   Calcium 8.6 (L) 8.9 - 10.3 mg/dL   Total Protein 6.4 (L) 6.5 - 8.1 g/dL   Albumin 3.2 (L) 3.5 - 5.0 g/dL  AST 32 15 - 41 U/L   ALT 32 0 - 44 U/L   Alkaline Phosphatase 108 38 - 126 U/L   Total Bilirubin 1.3 (H) 0.3 - 1.2 mg/dL   GFR calc non Af Amer 38 (L) >60 mL/min   GFR calc Af Amer 44 (L) >60 mL/min   Anion gap 11 5 - 15    Comment: Performed at [facility], [place] [address], [place], [place] [address]  CBC with Differential     Status: Abnormal   Collection Time: 10/21/18 10:20 AM  Result Value Ref Range   WBC 9.1 4.0 - 10.5 K/uL   RBC 5.29 4.22 - 5.81 MIL/uL   Hemoglobin 16.5 13.0 - 17.0 g/dL   HCT 49.9 39.0 - 52.0 %   MCV 94.3 80.0 - 100.0 fL   MCH 31.2 26.0 - 34.0 pg   MCHC 33.1 30.0 - 36.0 g/dL   RDW 15.9 (H) 11.5 - 15.5 %   Platelets 280 150 - 400 K/uL   nRBC 0.0 0.0 - 0.2 %   Neutrophils Relative % 67 %   Neutro Abs 6.1 1.7 - 7.7 K/uL    Lymphocytes Relative 9 %   Lymphs Abs 0.8 0.7 - 4.0 K/uL   Monocytes Relative 20 %   Monocytes Absolute 1.8 (H) 0.1 - 1.0 K/uL   Eosinophils Relative 4 %   Eosinophils Absolute 0.3 0.0 - 0.5 K/uL   Basophils Relative 0 %   Basophils Absolute 0.0 0.0 - 0.1 K/uL   WBC Morphology MORPHOLOGY UNREMARKABLE    Immature Granulocytes 0 %   Abs Immature Granulocytes 0.04 0.00 - 0.07 K/uL    Comment: Performed at [facility], [place] [address] [place], [place] [address]  Troponin I - Once     Status: Abnormal   Collection Time: 10/21/18 10:20 AM  Result Value Ref Range   Troponin I 0.03 (HH) <0.03 ng/mL    Comment: CRITICAL RESULT CALLED TO, READ BACK BY AND VERIFIED WITHGertha Calkin RN AT 1108 10/21/18 MULLINS,T Performed at [facility], [place] [address] [place], [place] [address]   Cortisol     Status: None   Collection Time: 10/21/18 10:20 AM  Result Value Ref Range   Cortisol, Plasma 32.2 ug/dL    Comment: (NOTE) AM    6.7 - 22.6 ug/dL PM   <10.0       ug/dL Performed at [place] [address], [place], [place] [address]   TSH     Status: Abnormal   Collection Time: 10/21/18 10:20 AM  Result Value Ref Range   TSH 7.447 (H) 0.350 - 4.500 uIU/mL    Comment: Performed by a 3rd Generation assay with a functional sensitivity of <=0.01 uIU/mL. Performed at [facility], [place] [address], [place], [place] [address]   SARS Coronavirus 2 [facility] order, Performed in [facility] hospital lab)     Status: None   Collection Time: 10/21/18 10:59 AM  Result Value Ref Range   SARS Coronavirus 2 NEGATIVE NEGATIVE    Comment: (NOTE) If result is NEGATIVE SARS-CoV-2 target nucleic acids are NOT DETECTED. The SARS-CoV-2 RNA is generally detectable in upper and lower  respiratory specimens during the acute phase of infection. The lowest  concentration of SARS-CoV-2 viral copies this assay can detect is 250  copies / mL. A negative  result does not preclude SARS-CoV-2 infection  and should not be used as the sole basis for treatment  or other  patient management decisions.  A negative result may occur with  improper specimen collection / handling, submission of specimen other  than nasopharyngeal swab, presence of viral mutation(s) within the  areas targeted by this assay, and inadequate number of viral copies  (<250 copies / mL). A negative result must be combined with clinical  observations, patient history, and epidemiological information. If result is POSITIVE SARS-CoV-2 target nucleic acids are DETECTED. The SARS-CoV-2 RNA is generally detectable in upper and lower  respiratory specimens dur ing the acute phase of infection.  Positive  results are indicative of active infection with SARS-CoV-2.  Clinical  correlation with patient history and other diagnostic information is  necessary to determine patient infection status.  Positive results do  not rule out bacterial infection or co-infection with other viruses. If result is PRESUMPTIVE POSTIVE SARS-CoV-2 nucleic acids MAY BE PRESENT.   A presumptive positive result was obtained on the submitted specimen  and confirmed on repeat testing.  While 2019 novel coronavirus  (SARS-CoV-2) nucleic acids may be present in the submitted sample  additional confirmatory testing may be necessary for epidemiological  and / or clinical management purposes  to differentiate between  SARS-CoV-2 and other Sarbecovirus currently known to infect humans.  If clinically indicated additional testing with an alternate test  methodology [phone]) is advised. The SARS-CoV-2 RNA is generally  detectable in upper and lower respiratory sp ecimens during the acute  phase of infection. The expected result is Negative. Fact Sheet for Patients:  StrictlyIdeas.no Fact Sheet for Healthcare Providers: BankingDealers.co.za This test is not yet  approved or cleared by the [place] FDA and has been authorized for detection and/or diagnosis of SARS-CoV-2 by FDA under an Emergency Use Authorization (EUA).  This EUA will remain in effect (meaning this test can be used) for the duration of the COVID-19 declaration under Section 564(b)(1) of the Act, 21 U.S.C. section 360bbb-3(b)(1), unless the authorization is terminated or revoked sooner. Performed at [facility], [place] [address] [place], [place] [address]   Respiratory Panel by PCR     Status: None   Collection Time: 10/21/18 10:59 AM  Result Value Ref Range   Adenovirus NOT DETECTED NOT DETECTED   Coronavirus 229E NOT DETECTED NOT DETECTED    Comment: (NOTE) The Coronavirus on the Respiratory Panel, DOES NOT test for the novel  Coronavirus (2019 nCoV)    Coronavirus HKU1 NOT DETECTED NOT DETECTED   Coronavirus NL63 NOT DETECTED NOT DETECTED   Coronavirus OC43 NOT DETECTED NOT DETECTED   Metapneumovirus NOT DETECTED NOT DETECTED   Rhinovirus / Enterovirus NOT DETECTED NOT DETECTED   Influenza A NOT DETECTED NOT DETECTED   Influenza B NOT DETECTED NOT DETECTED   Parainfluenza Virus 1 NOT DETECTED NOT DETECTED   Parainfluenza Virus 2 NOT DETECTED NOT DETECTED   Parainfluenza Virus 3 NOT DETECTED NOT DETECTED   Parainfluenza Virus 4 NOT DETECTED NOT DETECTED   Respiratory Syncytial Virus NOT DETECTED NOT DETECTED   Bordetella pertussis NOT DETECTED NOT DETECTED   Chlamydophila pneumoniae NOT DETECTED NOT DETECTED   Mycoplasma pneumoniae NOT DETECTED NOT DETECTED    Comment: Performed at [facility], [place] [address] [place], [place] [address]  Lactic acid, plasma     Status: Abnormal   Collection Time: 10/21/18 11:15 AM  Result Value Ref Range   Lactic Acid, Venous 3.6 (HH) 0.5 - 1.9 mmol/L    Comment: CRITICAL RESULT CALLED TO, READ BACK BY AND VERIFIED  WITH: K.Elvena Oyer RN AT 1202 ON 10/21/2018 BY S.VANHOORNE Performed at [facility], [place] [address], [place], [place] [address]   Urinalysis, Routine w reflex microscopic     Status: Abnormal   Collection Time: 10/21/18  2:13 PM  Result Value Ref Range   Color, Urine AMBER (A) YELLOW    Comment: BIOCHEMICALS MAY BE AFFECTED BY COLOR   APPearance HAZY (A) CLEAR   Specific Gravity, Urine 1.020 1.005 - 1.030   pH 5.0 5.0 - 8.0   Glucose, UA NEGATIVE NEGATIVE mg/dL   Hgb urine dipstick NEGATIVE NEGATIVE   Bilirubin Urine NEGATIVE NEGATIVE   Ketones, ur NEGATIVE NEGATIVE mg/dL   Protein, ur NEGATIVE NEGATIVE mg/dL   Nitrite NEGATIVE NEGATIVE   Leukocytes,Ua NEGATIVE NEGATIVE    Comment: Performed at [facility], [place] [address] [place], [place] [address]  Urine culture     Status: None (Preliminary result)   Collection Time: 10/21/18  2:13 PM  Result Value Ref Range   Specimen Description      URINE, CATHETERIZED Performed at [facility], [place] [address], [place], [place] [address]    Special Requests      NONE Performed at [facility], [place] [address] [place], [place] [address]    Culture PENDING    Report Status PENDING    Dg Chest Portable 1 View  Result Date: 10/21/2018 CLINICAL DATA:  68 year old male with a history of nausea chills EXAM: PORTABLE CHEST 1 VIEW COMPARISON:  07/15/2017, 07/06/2017 FINDINGS: Cardiomediastinal silhouette unchanged in size and contour. No pneumothorax. No pleural effusion. No confluent airspace disease. Low lung volumes with coarsened interstitial markings at the lung bases, similar prior. No displaced fracture. IMPRESSION: Chronic changes without evidence of acute cardiopulmonary disease Electronically Signed   By: Corrie Mckusick D.O.   On: 10/21/2018 10:57    Pending Labs Unresulted Labs (From admission, onward)    Start     Ordered   10/22/18 0500  HIV antibody (Routine Testing)  Tomorrow morning,   R     10/21/18 1440   10/22/18 9379  Basic metabolic panel  Daily,   R      10/21/18 1440   10/22/18 0500  CBC  Daily,   R     10/21/18 1440   10/21/18 1115  Lactic acid, plasma  Now then every 2 hours,   STAT     10/21/18 1114   10/21/18 1047  ACTH  Once,   R     10/21/18 1047   10/21/18 1047  Aldosterone + renin activity w/ ratio  Once,   R     10/21/18 1047   10/21/18 1015  Culture, blood (routine x 2)  BLOOD CULTURE X 2,   STAT    Question:  Patient immune status  Answer:  Immunocompromised   10/21/18 1014          Vitals/Pain Today's Vitals   10/21/18 1615 10/21/18 1630 10/21/18 1640 10/21/18 1700  BP:  (!) 89/59 (!) 95/52 (!) 115/48  Pulse: 65 63 66 64  Resp: (!) 24 19 (!) 21 19  Temp:      TempSrc:      SpO2: 98% 100% 97% 99%  Weight:      Height:        Isolation Precautions Droplet precaution  Medications Medications  0.9 %  sodium chloride infusion (1,000 mLs Intravenous New Bag/Given 10/21/18 1254)  sodium chloride flush (NS) 0.9 % injection  3 mL (has no administration in time range)  acetaminophen (TYLENOL) tablet 650 mg (has no administration in time range)    Or  acetaminophen (TYLENOL) suppository 650 mg (has no administration in time range)  senna (SENOKOT) tablet 8.6 mg (has no administration in time range)  magnesium hydroxide (MILK OF MAGNESIA) suspension 30 mL (has no administration in time range)  sorbitol 70 % solution 30 mL (has no administration in time range)  sodium phosphate (FLEET) 7-19 GM/118ML enema 1 enema (has no administration in time range)  ondansetron (ZOFRAN) tablet 4 mg (has no administration in time range)    Or  ondansetron (ZOFRAN) injection 4 mg (has no administration in time range)  enoxaparin (LOVENOX) injection 40 mg (has no administration in time range)  0.9 %  sodium chloride infusion (has no administration in time range)  sodium chloride 0.9 % bolus 1,000 mL (0 mLs Intravenous Stopped 10/21/18 1126)  ondansetron (ZOFRAN) injection 4 mg (4 mg Intravenous Given 10/21/18 1054)  hydrocortisone  sodium succinate (SOLU-CORTEF) 100 MG injection 100 mg (100 mg Intravenous Given 10/21/18 1054)  sodium chloride 0.9 % bolus 1,000 mL (0 mLs Intravenous Stopped 10/21/18 1252)  metroNIDAZOLE (FLAGYL) IVPB 500 mg (0 mg Intravenous Stopped 10/21/18 1402)  sodium chloride 0.9 % bolus 1,000 mL (0 mLs Intravenous Stopped 10/21/18 1403)  vancomycin (VANCOCIN) 2,000 mg in sodium chloride 0.9 % 500 mL IVPB (0 mg Intravenous Stopped 10/21/18 1710)  ceFEPIme (MAXIPIME) 2 g in sodium chloride 0.9 % 100 mL IVPB (0 g Intravenous Stopped 10/21/18 1333)  sodium chloride 0.9 % bolus 1,000 mL (0 mLs Intravenous Stopped 10/21/18 1710)    Mobility walks Low fall risk   Focused Assessments Renal Assessment Handoff:  Hemodialysis Schedule: Last Hemodialysis date and time:    Restricted appendage:       R Recommendations: See Admitting Provider Note  Report given to: Megan, 2W  Additional Notes:

## 2018-10-21 NOTE — ED Notes (Signed)
Attempted report x 2 

## 2018-10-21 NOTE — Progress Notes (Addendum)
PHARMACY NOTE:  ANTIMICROBIAL RENAL DOSAGE ADJUSTMENT  Current antimicrobial regimen includes a mismatch between antimicrobial dosage and estimated renal function. As per policy approved by the Pharmacy & Therapeutics and Medical Executive Committees, the antimicrobial dosage will be adjusted accordingly.  Current antimicrobial and dosage:    Vancomycin 2000 mg q24 hr  Cefepime 2g q8 hr  Indication: sepsis, unknown source  Renal Function:   Estimated Creatinine Clearance: 46.8 mL/min (A) (by C-G formula based on SCr of 1.8 mg/dL (H)). []      On intermittent HD, scheduled: []      On CRRT    Antimicrobial dosage has been changed to:    Vancomycin 1250 mg IV q24 hr  Cefepime 2g IV q12 hr   Additional Comments:   Estimated vancomycin AUC for initial regimen was significantly >550 mcg*hr/mL even when using most aggressive population parameters (TBW CrCl with Vd 0.72 L/kg). Using Vd 0.65 (for borderline BMI = 31), new estimated AUC is 495 with Cmax 31.5 and Cmin 13.4.   Pharmacy will continue to follow and adjust as appropriate for renal function; will request pharmacy to dose vanc if further intervention (i.e. levels) needed  Recommend SCr at least q48 hr while on vancomycin   Thank you for allowing pharmacy to be a part of this patient's care.  Reuel Boom, PharmD, BCPS [phone] 10/21/2018, 9:15 PM

## 2018-10-21 NOTE — Progress Notes (Signed)
A consult was received from an ED physician for vancomycin and cefepime per pharmacy dosing.  The patient's profile has been reviewed for ht/wt/allergies/indication/available labs.    A one time order has been placed for vancomycin 2000 mg IV x1and cefeopime 2gm x1.  Further antibiotics/pharmacy consults should be ordered by admitting physician if indicated.                       Thank you, Lynelle Doctor 10/21/2018  12:33 PM

## 2018-10-21 NOTE — ED Notes (Signed)
Pt provided with urinal, informed of need for urine sample.  

## 2018-10-21 NOTE — ED Notes (Signed)
CRITICAL VALUE STICKER  CRITICAL VALUE: Lactic Acid 3.6  RECEIVER (on-site recipient of call): Bartholome Bill, RN   DATE & TIME NOTIFIED: 10/21/18 1202  MESSENGER (representative from lab): Ellwood Handler  MD NOTIFIED: MD [place]: 1205  RESPONSE:

## 2018-10-21 NOTE — ED Notes (Signed)
Date and time results received: 10/21/18 1108 (use smartphrase ".now" to insert current time)  Test: troponin Critical Value: 0.03  Name of Provider Notified: Gilford Raid MD  Orders Received? Or Actions Taken?: Actions Taken: MD notified

## 2018-10-21 NOTE — ED Provider Notes (Signed)
[facility] DEPT Provider Note   CSN: [id number] Arrival date & time: 10/21/18  9678    History   Chief Complaint Chief Complaint  Patient presents with  . Nausea  . Chills    HPI LOUI MASSENBURG is a 68 y.o. male.     Pt presents to the ED today with nausea and chills.  No documented fever.  No cough.  He has a complex pmhx with a dx of MDS which transformed to AML.  He is s/p Bu/Cy conditioning followed by allogenic stem cell transplant 04/17/16.  He developed GVHD after the transplant, but that seems to be improving.  It affected his lung, eye, mouth, skin.  His local oncologist is Dr. Lindi Adie.  His primary BMT doctor is Dr. Corena Pilgrim.  The pt developed secondary adrenal insufficiency and is currently on hydrocort at 10 mg per day.  His doctor has been gradually weaning him down.  Last dose change over 2 months ago.  He did take a dose of 20 mg of hydrocort around 0330, but it did not seem to help.  Due to the Covid outbreak, pt has not been out of his house except to walk in his neighborhood.  His wife and son have been doing the grocery shopping.     Past Medical History:  Diagnosis Date  . Allergy    seasonal/animals  . Chronic prostatitis 12/17/2008  . Diverticulosis 01/16/2011   History of diverticulitis   . ERECTILE DYSFUNCTION 02/07/2007  . HYPERLIPIDEMIA 02/04/2007  . HYPERTENSION 02/04/2007  . NEOPLASM, MALIGNANT, SKIN, BACK 02/09/2009  . Pneumonia, viral 12/2017  . S/P bone marrow transplant [facility])     Patient Active Problem List   Diagnosis Date Noted  . Lactic acidosis 10/21/2018  . History of DVT (deep vein thrombosis) 09/25/2018  . Adrenocortical insufficiency ([place]) 08/06/2017  . Post chemotherapy Dry eyes due to decreased tear production 08/06/2017  . post chemotherapy Peripheral neuropathy 08/06/2017  . History of stem cell transplant ([place]) 03/04/2017  . Deep vein thrombosis (DVT) of femoral vein of right lower extremity  ([place]) 03/04/2017  . GVHD (graft versus host disease) ([place]) 03/04/2017  . MDS (myelodysplastic syndrome) ([place]) 12/06/2015  . BPH associated with nocturia 11/15/2014  . Leukopenia 11/15/2014  . Steroid-induced hyperglycemia 08/16/2014  . Chest pain 08/13/2014  . GERD (gastroesophageal reflux disease) 05/03/2014  . Cervical disc disorder with radiculopathy of cervical region 07/14/2010  . MIXED HEARING LOSS BILATERAL 07/14/2010  . ERECTILE DYSFUNCTION 02/07/2007  . Hyperlipidemia 02/04/2007  . Essential hypertension 02/04/2007    Past Surgical History:  Procedure Laterality Date  . CATARACT EXTRACTION Left 06/2018  . none          Home Medications    Prior to Admission medications   Medication Sig Start Date End Date Taking? Authorizing Provider  acyclovir (ZOVIRAX) 400 MG tablet Take 400 mg by mouth 2 (two) times daily.   Yes [provider]  albuterol (PROVENTIL HFA;VENTOLIN HFA) 108 (90 Base) MCG/ACT inhaler Inhale into the lungs every 6 (six) hours as needed for wheezing or shortness of breath.   Yes [provider]  amLODipine (NORVASC) 2.5 MG tablet Take 1 tablet (2.5 mg total) by mouth daily. 02/21/18  Yes [name], MD  aspirin 81 MG chewable tablet Chew by mouth daily.   Yes [provider]  atenolol (TENORMIN) 50 MG tablet Take 0.5 tablets (25 mg total) by mouth every evening. Patient taking differently: Take 50 mg by mouth  every evening.  07/21/17  Yes Oretha Milch D, MD  Cholecalciferol (VITAMIN D) 50 MCG (2000 UT) CAPS Take 2,000 Units by mouth daily.   Yes [provider]  erythromycin (E-MYCIN) 250 MG tablet Take 250 mg by mouth as directed. Monday, Wednesday, Friday   Yes [provider]  fluticasone (FLONASE) 50 MCG/ACT nasal spray Place 2 sprays into both nostrils daily as needed for allergies or rhinitis.   Yes [provider]  fluticasone (FLOVENT HFA) 220 MCG/ACT inhaler Inhale into the lungs 2 (two)  times daily.   Yes [provider]  hydrocortisone (CORTEF) 10 MG tablet Take 5 tablets in AM and 3 tablet in afternoon thru 1/28. Then Take 4 tablets in AM and 3 tablets in afternoon starting 1/29 Patient taking differently: Take 10-20 mg by mouth every morning. 10mg  daily UNLESS fever/nausea/vomitting/chills then it would be 20mg  immediately per pts MD 07/21/17  Yes Oretha Milch D, MD  loratadine (CLARITIN) 10 MG tablet Take 10 mg by mouth daily.   Yes [provider]  Magnesium Oxide 400 MG CAPS Take 1 capsule (400 mg total) by mouth 5 (five) times daily. Patient taking differently: Take 400 mg by mouth daily.  09/03/16  Yes Nicholas Lose, MD  montelukast (SINGULAIR) 10 MG tablet Take 10 mg by mouth at bedtime.   Yes [provider]  omeprazole (PRILOSEC) 40 MG capsule Take 40 mg by mouth 2 (two) times daily. 08/25/18  Yes [provider]  ondansetron (ZOFRAN-ODT) 8 MG disintegrating tablet Take 8 mg by mouth every 8 (eight) hours as needed for nausea or vomiting.   Yes [provider]  polyvinyl alcohol (LIQUIFILM TEARS) 1.4 % ophthalmic solution Place 1 drop into both eyes as needed for dry eyes.   Yes [provider]  posaconazole (NOXAFIL) 100 MG TBEC delayed-release tablet Take 300 mg by mouth daily. 06/03/16  Yes [provider]  senna-docusate (SENOKOT-S) 8.6-50 MG tablet Take 2 tablets by mouth Nightly.    Yes [provider]  omeprazole (PRILOSEC OTC) 20 MG tablet Take 2 tablets (40 mg total) by mouth 2 (two) times daily. take 1 tablet by mouth once daily Patient not taking: Reported on 10/21/2018 09/03/16   Nicholas Lose, MD    Family History Family History  Problem Relation Age of Onset  . Breast cancer Mother   . CVA Mother        quit taking HTN meds  . Alzheimer's disease Father        late 80s. mid 69s  . Asperger's syndrome Son     Social History Social History   Tobacco Use  . Smoking status: Never  Smoker  . Smokeless tobacco: Never Used  Substance Use Topics  . Alcohol use: Yes    Alcohol/week: 3.0 standard drinks    Types: 3 Standard drinks or equivalent per week  . Drug use: No     Allergies   Patient has no known allergies.   Review of Systems Review of Systems  Constitutional: Positive for chills and fatigue.  Neurological: Positive for weakness.  All other systems reviewed and are negative.    Physical Exam Updated Vital Signs BP (!) 95/55   Pulse 71   Temp 98.9 F (37.2 C) (Oral)   Resp (!) 25   Ht 5' 10.5" (1.791 m)   Wt 102.1 kg   SpO2 98%   BMI 31.83 kg/m   Physical Exam Vitals signs and nursing note reviewed.  Constitutional:  Appearance: Normal appearance.  HENT:     Head: Normocephalic and atraumatic.     Right Ear: External ear normal.     Left Ear: External ear normal.     Nose: Nose normal.     Mouth/Throat:     Mouth: Mucous membranes are dry.  Eyes:     Pupils: Pupils are equal, round, and reactive to light.  Neck:     Musculoskeletal: Normal range of motion and neck supple.  Cardiovascular:     Rate and Rhythm: Normal rate and regular rhythm.     Pulses: Normal pulses.     Heart sounds: Normal heart sounds.  Pulmonary:     Effort: Pulmonary effort is normal.     Breath sounds: Normal breath sounds.  Abdominal:     General: Abdomen is flat.  Musculoskeletal: Normal range of motion.     Right lower leg: Edema present.     Left lower leg: Edema present.  Skin:    General: Skin is warm.     Capillary Refill: Capillary refill takes less than 2 seconds.     Comments: Minimal rash on arms from GVHD  Neurological:     General: No focal deficit present.     Mental Status: He is alert and oriented to person, place, and time.  Psychiatric:        Mood and Affect: Mood normal.        Behavior: Behavior normal.      ED Treatments / Results  Labs (all labs ordered are listed, but only abnormal results are displayed) Labs  Reviewed  COMPREHENSIVE METABOLIC PANEL - Abnormal; Notable for the following components:      Result Value   CO2 18 (*)    Glucose, Bld 162 (*)    BUN 26 (*)    Creatinine, Ser 1.80 (*)    Calcium 8.6 (*)    Total Protein 6.4 (*)    Albumin 3.2 (*)    Total Bilirubin 1.3 (*)    GFR calc non Af Amer 38 (*)    GFR calc Af Amer 44 (*)    All other components within normal limits  CBC WITH DIFFERENTIAL/PLATELET - Abnormal; Notable for the following components:   RDW 15.9 (*)    Monocytes Absolute 1.8 (*)    All other components within normal limits  TROPONIN I - Abnormal; Notable for the following components:   Troponin I 0.03 (*)    All other components within normal limits  URINALYSIS, ROUTINE W REFLEX MICROSCOPIC - Abnormal; Notable for the following components:   Color, Urine AMBER (*)    APPearance HAZY (*)    All other components within normal limits  LACTIC ACID, PLASMA - Abnormal; Notable for the following components:   Lactic Acid, Venous 3.6 (*)    All other components within normal limits  CBG MONITORING, ED - Abnormal; Notable for the following components:   Glucose-Capillary 148 (*)    All other components within normal limits  SARS CORONAVIRUS 2 (HOSPITAL ORDER, [facility])  URINE CULTURE  CULTURE, BLOOD (ROUTINE X 2)  CULTURE, BLOOD (ROUTINE X 2)  RESPIRATORY PANEL BY PCR  CORTISOL  ACTH  ALDOSTERONE + RENIN ACTIVITY W/ RATIO  LACTIC ACID, PLASMA  TSH  CBG MONITORING, ED    EKG EKG Interpretation  Date/Time:  Tuesday October 21 2018 09:59:32 EDT Ventricular Rate:  80 PR Interval:    QRS Duration: 91 QT Interval:  400 QTC Calculation:  462 R Axis:   66 Text Interpretation:  Sinus rhythm Atrial premature complex Probable left atrial enlargement Low voltage, extremity and precordial leads Minimal ST depression, lateral leads No significant change since last tracing Confirmed by Isla Pence [phone]) on 10/21/2018 10:05:23 AM    Radiology Dg Chest Portable 1 View  Result Date: 10/21/2018 CLINICAL DATA:  68 year old male with a history of nausea chills EXAM: PORTABLE CHEST 1 VIEW COMPARISON:  07/15/2017, 07/06/2017 FINDINGS: Cardiomediastinal silhouette unchanged in size and contour. No pneumothorax. No pleural effusion. No confluent airspace disease. Low lung volumes with coarsened interstitial markings at the lung bases, similar prior. No displaced fracture. IMPRESSION: Chronic changes without evidence of acute cardiopulmonary disease Electronically Signed   By: Corrie Mckusick D.O.   On: 10/21/2018 10:57    Procedures Procedures (including critical care time)  Medications Ordered in ED Medications  vancomycin (VANCOCIN) 2,000 mg in sodium chloride 0.9 % 500 mL IVPB (2,000 mg Intravenous New Bag/Given 10/21/18 1334)  0.9 %  sodium chloride infusion (1,000 mLs Intravenous New Bag/Given 10/21/18 1254)  sodium chloride flush (NS) 0.9 % injection 3 mL (has no administration in time range)  acetaminophen (TYLENOL) tablet 650 mg (has no administration in time range)    Or  acetaminophen (TYLENOL) suppository 650 mg (has no administration in time range)  senna (SENOKOT) tablet 8.6 mg (has no administration in time range)  magnesium hydroxide (MILK OF MAGNESIA) suspension 30 mL (has no administration in time range)  sorbitol 70 % solution 30 mL (has no administration in time range)  sodium phosphate (FLEET) 7-19 GM/118ML enema 1 enema (has no administration in time range)  ondansetron (ZOFRAN) tablet 4 mg (has no administration in time range)    Or  ondansetron (ZOFRAN) injection 4 mg (has no administration in time range)  enoxaparin (LOVENOX) injection 40 mg (has no administration in time range)  0.9 %  sodium chloride infusion (has no administration in time range)  sodium chloride 0.9 % bolus 1,000 mL (0 mLs Intravenous Stopped 10/21/18 1126)  ondansetron (ZOFRAN) injection 4 mg (4 mg Intravenous Given 10/21/18 1054)   hydrocortisone sodium succinate (SOLU-CORTEF) 100 MG injection 100 mg (100 mg Intravenous Given 10/21/18 1054)  sodium chloride 0.9 % bolus 1,000 mL (0 mLs Intravenous Stopped 10/21/18 1252)  metroNIDAZOLE (FLAGYL) IVPB 500 mg (0 mg Intravenous Stopped 10/21/18 1402)  sodium chloride 0.9 % bolus 1,000 mL (0 mLs Intravenous Stopped 10/21/18 1403)  ceFEPIme (MAXIPIME) 2 g in sodium chloride 0.9 % 100 mL IVPB (0 g Intravenous Stopped 10/21/18 1333)     Initial Impression / Assessment and Plan / ED Course  I have reviewed the triage vital signs and the nursing notes.  Pertinent labs & imaging results that were available during my care of the patient were reviewed by me and considered in my medical decision making (see chart for details).       Pt given 3L IVFs and stress dose steroids (100 mg solu-cortef) and bp is slowly climbing.  It is now in the low 100s.    Due to multiple medical problems and immunocompromised state, he was also given IV abx  (vancomycin, flagyl, maxipime) for sepsis due to unkn cause.  He had a negative covid, cxr, ua.    He does have some AKI which I suspect will resolve after fluids.  He had 3L of NS before he could urinate and that was done as a cath.  Blood cultures, RVP, Urine cultures pending.   Pt's wife updated  by phone.  Pt d/w Dr. Pietro Cassis (triad) for admission.  CRITICAL CARE Performed by: Isla Pence   Total critical care time: 45 minutes  Critical care time was exclusive of separately billable procedures and treating other patients.  Critical care was necessary to treat or prevent imminent or life-threatening deterioration.  Critical care was time spent personally by me on the following activities: development of treatment plan with patient and/or surrogate as well as nursing, discussions with consultants, evaluation of patient's response to treatment, examination of patient, obtaining history from patient or surrogate, ordering and performing  treatments and interventions, ordering and review of laboratory studies, ordering and review of radiographic studies, pulse oximetry and re-evaluation of patient's condition.  LAMEL MCCARLEY was evaluated in Emergency Department on 10/21/2018 for the symptoms described in the history of present illness. He was evaluated in the context of the global COVID-19 pandemic, which necessitated consideration that the patient might be at risk for infection with the SARS-CoV-2 virus that causes COVID-19. Institutional protocols and algorithms that pertain to the evaluation of patients at risk for COVID-19 are in a state of rapid change based on information released by regulatory bodies including the CDC and federal and state organizations. These policies and algorithms were followed during the patient's care in the ED.  Final Clinical Impressions(s) / ED Diagnoses   Final diagnoses:  Secondary adrenal insufficiency ([place])  AKI (acute kidney injury) ([place])  Dehydration  History of allogeneic stem cell transplant ([place])  Chronic GVHD ([place])  Covid-19 Virus not Detected    ED Discharge Orders    None       Isla Pence, MD 10/21/18 1450

## 2018-10-21 NOTE — Telephone Encounter (Signed)
Please see message . Thank you .

## 2018-10-21 NOTE — ED Notes (Signed)
Admitting provider at bedside.

## 2018-10-21 NOTE — ED Triage Notes (Signed)
Pt from home c/o nausea and chills starting at appx 0130 today.  Denies emesis.  Reports taking chronic steroids, hx of stem cell transplant 03/2016.

## 2018-10-21 NOTE — ED Notes (Signed)
Patient transported to CT 

## 2018-10-21 NOTE — H&P (Addendum)
[facility] History and Physical  Brandon [name] NTI:[id number] DOB: September 30, 1950 DOA: 10/21/2018 Referring physician: ED PCP: [name], MD  Chief Complaint: Fever, diarrhea ------------------------------------------------------------------------------------------------------ Assessment/Plan: Principal Problem:   Sepsis ([place]) Active Problems:   MDS (myelodysplastic syndrome) ([place])   History of stem cell transplant ([place])   GVHD (graft versus host disease) ([place])   Lactic acidosis  Sepsis/lactic acidosis/hypotension -Although we do not have a clear source of infection at this time, patient is at high likelihood of having an occult infection in the setting of immunocompromised status leading to sepsis. -Fever, nausea and diarrhea makes me suspect intra-abdominal etiology.  Abdomen not tender on examination.  Will obtain CT abdomen and pelvis. -Lactic acid level elevated to 3.6.  Blood pressure has been persistently less than 867 systolic for last several hours despite 3 L of IV fluid.  Currently for 3 to running.  Lactic acid level being repeated.  -However, he does not have any fever in the ED, mental status remains intact and WBC count is normal. -If patient continues to have low blood pressure, he may need central line and pressors.  I have discussed this with PCCM Dr. Loanne Drilling.  We will monitor how he does with the fourth liter for going forward with central line placement.  He will be admitted to stepdown unit for now.  AKI -BUN/creatinine elevated 26/1.8.  Baseline BUN/creatinine 10/1.13 from 4 days ago. -Secondary to poor renal perfusion from hypotension.  Repeat renal function tomorrow.  Expect improvement with hydration.  History of adrenal insufficiency -Remains on 10 mg of hydrocortisone daily for last 3 months. -Denies recently missing any doses or recently self de-escalating the dose.  In fact took extra pill last night as advised. -I do not think his current  episode of hypotension is due to adrenal insufficiency.  He got dose of hydrocortisone 100 mg IV in the ED. we will continue his home dose of 10 mg oral.  History of stem cell transplant for myelodysplastic syndrome  - His local oncologist is Dr. Lindi Adie. His primary BMT doctor is Dr. Corena Pilgrim at [facility]. -Continue hydrocortisone 10 mg daily.  He is also on acyclovir 400 mg twice daily, erythromycin 250 mg tablet 3 times a week (MWF) and posaconazole 300 mg daily.  History of hypertension -Currently hypotensive.  Amlodipine and atenolol to be on hold.  Chronic cough/chronic sinusitis -Continue Flonase, Ventolin HFA, Singulair/Claritin  Mobility: Encourage ambulation Diet: Regular diet DVT prophylaxis:  Lovenox subcu Code Status:  Full code Disposition Plan:  Pending clinical course  ----------------------------------------------------------------------------------------------------- History of Present Illness: Brandon [name] is a 68 y.o. male with history of stem cell transplant in 2017 for myelodysplastic syndrome after which he developed GVHD and has remained on immunosuppressants including steroids with subsequent development of adrenal insufficiency. Patient presented to the ED today with complaint of fever, chills, nausea, diarrhea.  It all started last night with chills.  He spiked a temperature of 100.  He had several episodes of diarrhea and nausea, no vomiting.  He does not have any new respiratory symptoms.  He has chronic cough with postnasal discharge which has not gotten worse.  No shortness of breath.  No chest pain.  No urinary symptoms. He is in process of being weaned down from steroids.  3 months ago, he was weaned down to hydrocortisone 10 mg daily and an additional 10 mg if/when he gets ill from anything.  As advised he took an extra dose of hydrocortisone last night.  He  continued to feel bad to this morning and decided to come to ED. His local oncologist is Dr. Lindi Adie.   His primary BMT doctor is Dr. Corena Pilgrim at [facility].  In the ED, his temperature was 98.9, heart rate in 70s, breathing on low 20s.  His blood pressure for last several hours has remained mostly under 100.  Lactic acid level elevated to 3.6.  He got 3 L of normal saline bolus already and is currently on fourth liter.  Last blood pressure 30 minutes ago was 87/63.  At the time of my evaluation, patient was propped up in bed.  Pleasant elderly gentleman with normal mentation. His bone marrow transplant doctor Dr. Leanora Cover from [facility] called him while I was in the room and we discussed the plan.  In the ED, labs showed normal WBC count at 9.1, hemoglobin 16.5, platelets 280. Sodium level normal at 136, potassium 3.9, serum bicarb low at 18, BUN/creatinine elevated to 26/1.8, troponin mildly elevated 0.03, plasma cortisol level normal at 32. Respiratory virus panel by PCR as well as COVID-19 test negative. Urinalysis with hazy, amber-colored urine with no bacteria or leukocytes. Blood culture sent. Chest x-ray showed chronic changes without evidence of acute cardiopulmonary disease.  Review of Systems:  All systems were reviewed and were negative unless otherwise mentioned in the HPI   Past medical history: Past Medical History:  Diagnosis Date  . Allergy    seasonal/animals  . Chronic prostatitis 12/17/2008  . Diverticulosis 01/16/2011   History of diverticulitis   . ERECTILE DYSFUNCTION 02/07/2007  . HYPERLIPIDEMIA 02/04/2007  . HYPERTENSION 02/04/2007  . NEOPLASM, MALIGNANT, SKIN, BACK 02/09/2009  . Pneumonia, viral 12/2017  . S/P bone marrow transplant [facility])     Past surgical history: Past Surgical History:  Procedure Laterality Date  . CATARACT EXTRACTION Left 06/2018  . none      Social History:  reports that he has never smoked. He has never used smokeless tobacco. He reports current alcohol use of about 3.0 standard drinks of alcohol per week. He reports that he does not use  drugs.  Allergies:  No Known Allergies  Family history:  Family History  Problem Relation Age of Onset  . Breast cancer Mother   . CVA Mother        quit taking HTN meds  . Alzheimer's disease Father        late 35s. mid 29s  . Asperger's syndrome Son      Home Meds: Prior to Admission medications   Medication Sig Start Date End Date Taking? Authorizing Provider  acyclovir (ZOVIRAX) 400 MG tablet Take 400 mg by mouth 2 (two) times daily.   Yes [provider]  albuterol (PROVENTIL HFA;VENTOLIN HFA) 108 (90 Base) MCG/ACT inhaler Inhale into the lungs every 6 (six) hours as needed for wheezing or shortness of breath.   Yes [provider]  amLODipine (NORVASC) 2.5 MG tablet Take 1 tablet (2.5 mg total) by mouth daily. 02/21/18  Yes [name], MD  aspirin 81 MG chewable tablet Chew by mouth daily.   Yes [provider]  atenolol (TENORMIN) 50 MG tablet Take 0.5 tablets (25 mg total) by mouth every evening. Patient taking differently: Take 50 mg by mouth every evening.  07/21/17  Yes Oretha Milch D, MD  Cholecalciferol (VITAMIN D) 50 MCG (2000 UT) CAPS Take 2,000 Units by mouth daily.   Yes [provider]  erythromycin (E-MYCIN) 250 MG tablet Take 250 mg by mouth as directed. Monday,  Wednesday, Friday   Yes [provider]  fluticasone (FLONASE) 50 MCG/ACT nasal spray Place 2 sprays into both nostrils daily as needed for allergies or rhinitis.   Yes [provider]  fluticasone (FLOVENT HFA) 220 MCG/ACT inhaler Inhale into the lungs 2 (two) times daily.   Yes [provider]  hydrocortisone (CORTEF) 10 MG tablet Take 5 tablets in AM and 3 tablet in afternoon thru 1/28. Then Take 4 tablets in AM and 3 tablets in afternoon starting 1/29 Patient taking differently: Take 10-20 mg by mouth every morning. 10mg  daily UNLESS fever/nausea/vomitting/chills then it would be 20mg  immediately per pts MD 07/21/17  Yes Oretha Milch  D, MD  loratadine (CLARITIN) 10 MG tablet Take 10 mg by mouth daily.   Yes [provider]  Magnesium Oxide 400 MG CAPS Take 1 capsule (400 mg total) by mouth 5 (five) times daily. Patient taking differently: Take 400 mg by mouth daily.  09/03/16  Yes Nicholas Lose, MD  montelukast (SINGULAIR) 10 MG tablet Take 10 mg by mouth at bedtime.   Yes [provider]  omeprazole (PRILOSEC) 40 MG capsule Take 40 mg by mouth 2 (two) times daily. 08/25/18  Yes [provider]  ondansetron (ZOFRAN-ODT) 8 MG disintegrating tablet Take 8 mg by mouth every 8 (eight) hours as needed for nausea or vomiting.   Yes [provider]  polyvinyl alcohol (LIQUIFILM TEARS) 1.4 % ophthalmic solution Place 1 drop into both eyes as needed for dry eyes.   Yes [provider]  posaconazole (NOXAFIL) 100 MG TBEC delayed-release tablet Take 300 mg by mouth daily. 06/03/16  Yes [provider]  senna-docusate (SENOKOT-S) 8.6-50 MG tablet Take 2 tablets by mouth Nightly.    Yes [provider]  omeprazole (PRILOSEC OTC) 20 MG tablet Take 2 tablets (40 mg total) by mouth 2 (two) times daily. take 1 tablet by mouth once daily Patient not taking: Reported on 10/21/2018 09/03/16   Nicholas Lose, MD    Physical Exam: Vitals:   10/21/18 1555 10/21/18 1600 10/21/18 1615 10/21/18 1630  BP:  92/60  (!) 89/59  Pulse: 64 64 65 63  Resp: 18 19 (!) 24 19  Temp:      TempSrc:      SpO2: 98% 98% 98% 100%  Weight:      Height:       Wt Readings from Last 3 Encounters:  10/21/18 102.1 kg  09/25/18 102.1 kg  02/21/18 107.1 kg   Body mass index is 31.83 kg/m.  General exam: Pleasant elderly Caucasian male, propped up in bed.  Not in distress Skin: No rashes, lesions or ulcers. HEENT: Normal Lungs: Clear to auscultation bilaterally CVS: Regular rate and rhythm, no murmur GI/Abd soft, nondistended, nontender, bowel sound present CNS: Alert, awake, oriented x3 Psychiatry:  Mood & affect appropriate.  Extremities: Trace bilateral pedal edema, no calf tenderness  Labs on Admission:   CBC: Recent Labs  Lab 10/17/18 0857 10/21/18 1020  WBC 7.6 9.1  NEUTROABS 4.1 6.1  HGB 14.9 16.5  HCT 44.4 49.9  MCV 91.0 94.3  PLT 270 469    Basic Metabolic Panel: Recent Labs  Lab 10/17/18 0857 10/21/18 1020  NA 139 136  K 4.2 3.9  CL 106 107  CO2 23 18*  GLUCOSE 99 162*  BUN 10 26*  CREATININE 1.13 1.80*  CALCIUM 9.1 8.6*  MG 1.8  --     Liver Function Tests: Recent Labs  Lab 10/17/18 0857 10/21/18  1020  AST 24 32  ALT 27 32  ALKPHOS 140* 108  BILITOT 0.7 1.3*  PROT 6.8 6.4*  ALBUMIN 3.4* 3.2*   No results for input(s): LIPASE, AMYLASE in the last 168 hours. No results for input(s): AMMONIA in the last 168 hours.  Cardiac Enzymes: Recent Labs  Lab 10/21/18 1020  TROPONINI 0.03*    BNP (last 3 results) No results for input(s): BNP in the last 8760 hours.  ProBNP (last 3 results) No results for input(s): PROBNP in the last 8760 hours.  CBG: Recent Labs  Lab 10/21/18 1003  GLUCAP 148*    Lipase  No results found for: LIPASE   Urinalysis    Component Value Date/Time   COLORURINE AMBER (A) 10/21/2018 1413   APPEARANCEUR HAZY (A) 10/21/2018 1413   LABSPEC 1.020 10/21/2018 1413   PHURINE 5.0 10/21/2018 1413   GLUCOSEU NEGATIVE 10/21/2018 1413   GLUCOSEU NEGATIVE 12/23/2009 0742   HGBUR NEGATIVE 10/21/2018 1413   HGBUR negative 01/24/2007 0810   BILIRUBINUR NEGATIVE 10/21/2018 1413   BILIRUBINUR negative 08/10/2014 1057   BILIRUBINUR n 02/11/2013 1059   KETONESUR NEGATIVE 10/21/2018 1413   PROTEINUR NEGATIVE 10/21/2018 1413   UROBILINOGEN 0.2 08/10/2014 1057   UROBILINOGEN 0.2 12/23/2009 0742   NITRITE NEGATIVE 10/21/2018 1413   LEUKOCYTESUR NEGATIVE 10/21/2018 1413     Drugs of Abuse  No results found for: LABOPIA, COCAINSCRNUR, LABBENZ, AMPHETMU, THCU, LABBARB    Radiological Exams on Admission: Dg Chest Portable  1 View  Result Date: 10/21/2018 CLINICAL DATA:  68 year old male with a history of nausea chills EXAM: PORTABLE CHEST 1 VIEW COMPARISON:  07/15/2017, 07/06/2017 FINDINGS: Cardiomediastinal silhouette unchanged in size and contour. No pneumothorax. No pleural effusion. No confluent airspace disease. Low lung volumes with coarsened interstitial markings at the lung bases, similar prior. No displaced fracture. IMPRESSION: Chronic changes without evidence of acute cardiopulmonary disease Electronically Signed   By: Corrie Mckusick D.O.   On: 10/21/2018 10:57   ----------------------------------------------------------------------------------------------------------------------------------------------------------- Severity of Illness: The appropriate patient status for this patient is INPATIENT. Inpatient status is judged to be reasonable and necessary in order to provide the required intensity of service to ensure the patient's safety. The patient's presenting symptoms, physical exam findings, and initial radiographic and laboratory data in the context of their chronic comorbidities is felt to place them at high risk for further clinical deterioration. Furthermore, it is not anticipated that the patient will be medically stable for discharge from the hospital within 2 midnights of admission. The following factors support the patient status of inpatient.   " The patient's presenting symptoms include fever, diarrhea. " The worrisome physical exam findings include fatigue, persistent low blood pressure. " The initial radiographic and laboratory data are worrisome because of lactic acidosis. " The chronic co-morbidities include immunosuppression.   * I certify that at the point of admission it is my clinical judgment that the patient will require inpatient hospital care spanning beyond 2 midnights from the point of admission due to high intensity of service, high risk for further deterioration and high frequency  of surveillance required.*   Signed, Terrilee Croak, MD [facility] 10/21/2018

## 2018-10-22 ENCOUNTER — Encounter (HOSPITAL_COMMUNITY): Payer: Self-pay | Admitting: Internal Medicine

## 2018-10-22 DIAGNOSIS — N179 Acute kidney failure, unspecified: Secondary | ICD-10-CM

## 2018-10-22 DIAGNOSIS — D469 Myelodysplastic syndrome, unspecified: Secondary | ICD-10-CM | POA: Diagnosis present

## 2018-10-22 DIAGNOSIS — I10 Essential (primary) hypertension: Secondary | ICD-10-CM | POA: Diagnosis present

## 2018-10-22 DIAGNOSIS — R739 Hyperglycemia, unspecified: Secondary | ICD-10-CM | POA: Diagnosis present

## 2018-10-22 DIAGNOSIS — E2740 Unspecified adrenocortical insufficiency: Secondary | ICD-10-CM

## 2018-10-22 DIAGNOSIS — E2749 Other adrenocortical insufficiency: Secondary | ICD-10-CM

## 2018-10-22 DIAGNOSIS — R197 Diarrhea, unspecified: Secondary | ICD-10-CM | POA: Diagnosis present

## 2018-10-22 DIAGNOSIS — Z1159 Encounter for screening for other viral diseases: Secondary | ICD-10-CM | POA: Diagnosis not present

## 2018-10-22 DIAGNOSIS — D89811 Chronic graft-versus-host disease: Secondary | ICD-10-CM

## 2018-10-22 DIAGNOSIS — J329 Chronic sinusitis, unspecified: Secondary | ICD-10-CM | POA: Diagnosis present

## 2018-10-22 DIAGNOSIS — E860 Dehydration: Secondary | ICD-10-CM

## 2018-10-22 DIAGNOSIS — R109 Unspecified abdominal pain: Secondary | ICD-10-CM | POA: Diagnosis present

## 2018-10-22 DIAGNOSIS — T380X5A Adverse effect of glucocorticoids and synthetic analogues, initial encounter: Secondary | ICD-10-CM | POA: Diagnosis present

## 2018-10-22 DIAGNOSIS — Z856 Personal history of leukemia: Secondary | ICD-10-CM | POA: Diagnosis not present

## 2018-10-22 DIAGNOSIS — T865 Complications of stem cell transplant: Secondary | ICD-10-CM | POA: Diagnosis present

## 2018-10-22 DIAGNOSIS — E872 Acidosis: Secondary | ICD-10-CM | POA: Diagnosis present

## 2018-10-22 DIAGNOSIS — E272 Addisonian crisis: Secondary | ICD-10-CM | POA: Diagnosis present

## 2018-10-22 DIAGNOSIS — Z20828 Contact with and (suspected) exposure to other viral communicable diseases: Secondary | ICD-10-CM | POA: Diagnosis present

## 2018-10-22 DIAGNOSIS — J189 Pneumonia, unspecified organism: Secondary | ICD-10-CM | POA: Diagnosis present

## 2018-10-22 DIAGNOSIS — Y830 Surgical operation with transplant of whole organ as the cause of abnormal reaction of the patient, or of later complication, without mention of misadventure at the time of the procedure: Secondary | ICD-10-CM | POA: Diagnosis present

## 2018-10-22 DIAGNOSIS — R6520 Severe sepsis without septic shock: Secondary | ICD-10-CM

## 2018-10-22 DIAGNOSIS — A419 Sepsis, unspecified organism: Principal | ICD-10-CM

## 2018-10-22 DIAGNOSIS — Z9484 Stem cells transplant status: Secondary | ICD-10-CM

## 2018-10-22 DIAGNOSIS — Z803 Family history of malignant neoplasm of breast: Secondary | ICD-10-CM | POA: Diagnosis not present

## 2018-10-22 DIAGNOSIS — E876 Hypokalemia: Secondary | ICD-10-CM | POA: Diagnosis present

## 2018-10-22 LAB — BASIC METABOLIC PANEL
Anion gap: 5 (ref 5–15)
BUN: 22 mg/dL (ref 8–23)
CO2: 18 mmol/L — ABNORMAL LOW (ref 22–32)
Calcium: 7.5 mg/dL — ABNORMAL LOW (ref 8.9–10.3)
Chloride: 114 mmol/L — ABNORMAL HIGH (ref 98–111)
Creatinine, Ser: 1.23 mg/dL (ref 0.61–1.24)
GFR calc Af Amer: >60 mL/min (ref >60)
GFR calc non Af Amer: 60 mL/min — ABNORMAL LOW (ref >60)
Glucose, Bld: 87 mg/dL (ref 70–99)
Potassium: 3.6 mmol/L (ref 3.5–5.1)
Sodium: 137 mmol/L (ref 135–145)

## 2018-10-22 LAB — URINE CULTURE: Culture: NO GROWTH

## 2018-10-22 LAB — STREP PNEUMONIAE URINARY ANTIGEN: Strep Pneumo Urinary Antigen: NEGATIVE

## 2018-10-22 LAB — CBC
HCT: 37.3 % — ABNORMAL LOW (ref 39.0–52.0)
Hemoglobin: 12 g/dL — ABNORMAL LOW (ref 13.0–17.0)
MCH: 30.8 pg (ref 26.0–34.0)
MCHC: 32.2 g/dL (ref 30.0–36.0)
MCV: 95.6 fL (ref 80.0–100.0)
Platelets: 185 10*3/uL (ref 150–400)
RBC: 3.9 MIL/uL — ABNORMAL LOW (ref 4.22–5.81)
RDW: 16.7 % — ABNORMAL HIGH (ref 11.5–15.5)
WBC: 7.6 10*3/uL (ref 4.0–10.5)
nRBC: 0 % (ref 0.0–0.2)

## 2018-10-22 LAB — ACTH

## 2018-10-22 MED ORDER — BUDESONIDE 0.5 MG/2ML IN SUSP
0.5000 mg | Freq: Two times a day (BID) | RESPIRATORY_TRACT | Status: DC
  Administered 2018-10-22 – 2018-10-25 (×7): 0.5 mg via RESPIRATORY_TRACT
  Filled 2018-10-22 (×7): qty 2

## 2018-10-22 MED ORDER — LIP MEDEX EX OINT
TOPICAL_OINTMENT | CUTANEOUS | Status: AC
  Administered 2018-10-22: 15:00:00
  Filled 2018-10-22: qty 7

## 2018-10-22 MED ORDER — ORAL CARE MOUTH RINSE
15.0000 mL | Freq: Two times a day (BID) | OROMUCOSAL | Status: DC
  Administered 2018-10-22: 08:00:00 15 mL via OROMUCOSAL

## 2018-10-22 MED ORDER — SODIUM CHLORIDE 0.9 % IV SOLN
INTRAVENOUS | Status: DC
  Administered 2018-10-22 – 2018-10-23 (×2): via INTRAVENOUS

## 2018-10-22 MED ORDER — SENNA 8.6 MG PO TABS: 1.0000 | ORAL_TABLET | Freq: Every day | ORAL | Status: DC | PRN

## 2018-10-22 MED ORDER — SODIUM CHLORIDE 0.9 % IV SOLN
2.0000 g | Freq: Three times a day (TID) | INTRAVENOUS | Status: AC
  Administered 2018-10-22 – 2018-10-23 (×5): 2 g via INTRAVENOUS
  Filled 2018-10-22 (×5): qty 2

## 2018-10-22 MED ORDER — HYDROCORTISONE NA SUCCINATE PF 100 MG IJ SOLR
40.0000 mg | Freq: Two times a day (BID) | INTRAMUSCULAR | Status: DC
  Administered 2018-10-22 – 2018-10-23 (×3): 40 mg via INTRAVENOUS
  Filled 2018-10-22 (×3): qty 2

## 2018-10-22 NOTE — Progress Notes (Signed)
Pharmacy Antibiotic Note  Brandon [name] is a 68 y.o. male with hx MDS and stem cell transplant in 2017 (on cortef PTA) presented to the ED on 10/21/2018 with c/o nausea and chills.  Broad abx with flaygyl cefepime and vancomycin started on admission for suspected sepsis.  Today, 10/22/2018: - afeb, wbc wnl - scr down 1.23 (crcl~68) - all cultures have been negative thus far  Plan: - adjust cefepime dose to 2gm IV q8h - continue vancomycin 1250 mg IV q24h for est AUC 463 - metronidazole 500 mg IV q8h (per MD) - f/u cx, renal function  _____________________________________  Height: 5' 10.5" (179.1 cm) Weight: 225 lb (102.1 kg) IBW/kg (Calculated) : 74.15  Temp (24hrs), Avg:98.4 F (36.9 C), Min:97.9 F (36.6 C), Max:98.9 F (37.2 C)  Recent Labs  Lab 10/17/18 0857 10/21/18 1020 10/21/18 1115 10/21/18 1651 10/22/18 0303  WBC 7.6 9.1  --   --  7.6  CREATININE 1.13 1.80*  --   --  1.23  LATICACIDVEN  --   --  3.6* 2.5*  --     Estimated Creatinine Clearance: 68.5 mL/min (by C-G formula based on SCr of 1.23 mg/dL).    No Known Allergies  Antimicrobials this admission:  4/28 Cefepime>> 4/28 Vanc>> 4/28 Flagyl>>  Home Posaconazole 300 mg daily resumed  Dose adjustments this admission:  --  Microbiology results:  4/28 BCx x2:  4/28 UCx:  4/28 resp panel pcr: neg 4/28 MRSA PCR: neg 4/28 COVID: negative  Thank you for allowing pharmacy to be a part of this patient's care.  Lynelle Doctor 10/22/2018 9:19 AM

## 2018-10-22 NOTE — Progress Notes (Signed)
Pt transferred to 68 with belongings wife dropped off today to include: pajama bottoms, phone charger, glasses, and rosary beads. RN, Abby aware.

## 2018-10-22 NOTE — Progress Notes (Addendum)
[facility] PROGRESS NOTE  ODIES DESA KDT:[id number] DOB: 04-27-1951 DOA: 10/21/2018 PCP: [name], MD  Assessment/Plan:  Severe sepsis with acute renal failure and lactic acidosis, resolved Presented with tachypnea and hypotension with associated lactic acidosis. Complains of abdominal pain/loose stool but none here so far. CT chest/abdomen shows patchy nodular airspace disease in left upper lobe which could either reflect infectious or inflammatory etiology. RVP unremarklable Currently on vancomycincefepime, flagyl( anaerobes given some GI involvement), and home poscoanozole, erythromycin, acyclovir. Will add s pna and legionella, follow up blood cultures. Obtain c diff if starts having diarrhea  Hypotension with lactic acidosis, resolved. Required 4 L IVF plus solucortef to go from SBP in 80s to normotensive BP. Likely related to some infection likely  pna given CT chest findings. Continue empiric antibiotics monitor closely.  Continue maintenance IV fluids over 24 hours and reassess.  We will also continue stress dose IV steroids the less likely degenerative urgency flare given concern for infection would like to avoid any worsening hypotension.  Suspected Covid, resolved. COVID testing negative, remains afebrile, no o2  Abdominal pain, unclear etiology.  CT abdomen unremarkable, abdominal pain currently resolved.  Not having any current diarrhea or nausea, tolerating oral intake.  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  Continue to monitor CMP, add C. difficile if diarrhea resumes.  Chronic adrenal insufficiency(suspected secondary) due to chronic high dose steroid use with bone marrow transplant for myelodysplastic syndrome. Typically takes hydrocortisone 10 mg over 3 months with no new changes, had stress dose of 100 mg of Solucortef on 4/28 given concern for GI  infection and hypotension. Continue stress dosing of solucortef will discuss this complicated patient with his primary endocrinologist and oncologist at [facility]  AKI, prerenal, resolved.  Related to decreased oral intake/Sirs criteria from potential infection.  Creatinine back at baseline.  Monitor BMP  History of stem cell transplant for MDS. Local oncologist is Dr. Lindi Adie, [facility] B[facility] doctor is Dr. Corena Pilgrim at [facility]. Continue hydrocortisone 10 mg, acyclovir 400 mg twice daily, erythromycin 250 mg tablet 3 times a week (MWF) and posaconazole 300 mg daily  HTN, holding home BP meds while monitor BP.   Chronic cough/sinusitis, stable. Continue flonase, scheduled inhalers   Code Status: FULL CODe Family Communication:  No family at bedside (indicate person spoken with, relationship, and if by phone, the number) Disposition Plan: Transfer from stepdown to floor, monitor BP, monitor cultures on antibiotics   Consultants:  none  Procedures:  none  Antibiotics:  Vancomycin, cefepime, flagyl, posaconazole, erythromycin (indicate start date, and stop date if known)  HPI/Subjective:  Brandon [name] is a 68 y.o. year old male with medical history significant for chronic adrenal insufficiency, presumed secondary) on hydrocortisone, history of stem cell transplant ([id number]) complicated by GVHD primarily followed at [place] who presented on 10/21/2018 with reports of subjective fever, chills, nausea, diarrhea and abdominal pain and was found to have persistent hypotension that eventually responded with IV fluids and stress dose Solu-Cortef lactic acidosis as well as Sirs criteria being met.  This a.m. only reports chronic cough Reports improvement in abdominal pain No loose stool Normal appetite No fevers or chills  Objective: Vitals:   10/22/18 1300 10/22/18 1400  BP: 124/71 119/69  Pulse: 63 64  Resp: 15 16  Temp:    SpO2: 96% 97%    Intake/Output Summary (Last 24 hours) at 10/22/2018  1417 Last  data filed at 10/22/2018 1300 Gross per 24 hour  Intake 3099.04 ml  Output -  Net 3099.04 ml   Filed Weights   10/21/18 1023  Weight: 102.1 kg    Exam:   General: Elderly male, no distress  Cardiovascular: Regular rate and rhythm, slight pitting edema of bilateral ankles  Respiratory: Normal respiratory effort on room air  Abdomen: Soft, nondistended, nontender, diminished bowel sounds  Musculoskeletal: Normal range of motion  Skin no rash or lesions  Neurologic no appreciable focal deficits  Data Reviewed: Basic Metabolic Panel: Recent Labs  Lab 10/17/18 0857 10/21/18 1020 10/22/18 0303  NA 139 136 137  K 4.2 3.9 3.6  CL 106 107 114*  CO2 23 18* 18*  GLUCOSE 99 162* 87  BUN 10 26* 22  CREATININE 1.13 1.80* 1.23  CALCIUM 9.1 8.6* 7.5*  MG 1.8  --   --    Liver Function Tests: Recent Labs  Lab 10/17/18 0857 10/21/18 1020  AST 24 32  ALT 27 32  ALKPHOS 140* 108  BILITOT 0.7 1.3*  PROT 6.8 6.4*  ALBUMIN 3.4* 3.2*   No results for input(s): LIPASE, AMYLASE in the last 168 hours. No results for input(s): AMMONIA in the last 168 hours. CBC: Recent Labs  Lab 10/17/18 0857 10/21/18 1020 10/22/18 0303  WBC 7.6 9.1 7.6  NEUTROABS 4.1 6.1  --   HGB 14.9 16.5 12.0*  HCT 44.4 49.9 37.3*  MCV 91.0 94.3 95.6  PLT 270 280 185   Cardiac Enzymes: Recent Labs  Lab 10/21/18 1020  TROPONINI 0.03*   BNP (last 3 results) No results for input(s): BNP in the last 8760 hours.  ProBNP (last 3 results) No results for input(s): PROBNP in the last 8760 hours.  CBG: Recent Labs  Lab 10/21/18 1003  GLUCAP 148*    Recent Results (from the past 240 hour(s))  Culture, blood (routine x 2)     Status: None (Preliminary result)   Collection Time: 10/21/18 10:20 AM  Result Value Ref Range Status   Specimen Description   Final    BLOOD LEFT ANTECUBITAL Performed at [facility] [address] [place], [place] [address]     Special Requests   Final    BOTTLES DRAWN AEROBIC AND ANAEROBIC Blood Culture adequate volume Performed at [facility] [address], [place], [place] [address]    Culture   Final    NO GROWTH 1 DAY Performed at [facility], [place] [address] [place], [place] [address]    Report Status PENDING  Incomplete  Culture, blood (routine x 2)     Status: None (Preliminary result)   Collection Time: 10/21/18 10:22 AM  Result Value Ref Range Status   Specimen Description   Final    BLOOD RIGHT ANTECUBITAL Performed at [place] [address] [place], [place] [address]    Special Requests   Final    BOTTLES DRAWN AEROBIC AND ANAEROBIC Blood Culture adequate volume Performed at [facility] [address], [place], [place] [address]    Culture   Final    NO GROWTH 1 DAY Performed at [facility], [place] [address] [place], [place] [address]    Report Status PENDING  Incomplete  SARS Coronavirus 2 [facility] order, Performed in [place] hospital lab)     Status: None   Collection Time: 10/21/18 10:59 AM  Result Value Ref Range Status   SARS Coronavirus  2 NEGATIVE NEGATIVE Final    Comment: (NOTE) If result is NEGATIVE SARS-CoV-2 target nucleic acids are NOT DETECTED. The SARS-CoV-2 RNA is generally detectable in upper and lower  respiratory specimens during the acute phase of infection. The lowest  concentration of SARS-CoV-2 viral copies this assay can detect is 250  copies / mL. A negative result does not preclude SARS-CoV-2 infection  and should not be used as the sole basis for treatment or other  patient management decisions.  A negative result may occur with  improper specimen collection / handling, submission of specimen other  than nasopharyngeal swab, presence of viral mutation(s) within the  areas targeted by this assay, and inadequate number of viral copies  (<250 copies / mL). A negative result must  be combined with clinical  observations, patient history, and epidemiological information. If result is POSITIVE SARS-CoV-2 target nucleic acids are DETECTED. The SARS-CoV-2 RNA is generally detectable in upper and lower  respiratory specimens dur ing the acute phase of infection.  Positive  results are indicative of active infection with SARS-CoV-2.  Clinical  correlation with patient history and other diagnostic information is  necessary to determine patient infection status.  Positive results do  not rule out bacterial infection or co-infection with other viruses. If result is PRESUMPTIVE POSTIVE SARS-CoV-2 nucleic acids MAY BE PRESENT.   A presumptive positive result was obtained on the submitted specimen  and confirmed on repeat testing.  While 2019 novel coronavirus  (SARS-CoV-2) nucleic acids may be present in the submitted sample  additional confirmatory testing may be necessary for epidemiological  and / or clinical management purposes  to differentiate between  SARS-CoV-2 and other Sarbecovirus currently known to infect humans.  If clinically indicated additional testing with an alternate test  methodology [phone]) is advised. The SARS-CoV-2 RNA is generally  detectable in upper and lower respiratory sp ecimens during the acute  phase of infection. The expected result is Negative. Fact Sheet for Patients:  StrictlyIdeas.no Fact Sheet for Healthcare Providers: BankingDealers.co.za This test is not yet approved or cleared by the [place] FDA and has been authorized for detection and/or diagnosis of SARS-CoV-2 by FDA under an Emergency Use Authorization (EUA).  This EUA will remain in effect (meaning this test can be used) for the duration of the COVID-19 declaration under Section 564(b)(1) of the Act, 21 U.S.C. section 360bbb-3(b)(1), unless the authorization is terminated or revoked sooner. Performed at [facility], [place] [address], [place], [place] [address]   Respiratory Panel by PCR     Status: None   Collection Time: 10/21/18 10:59 AM  Result Value Ref Range Status   Adenovirus NOT DETECTED NOT DETECTED Final   Coronavirus 229E NOT DETECTED NOT DETECTED Final    Comment: (NOTE) The Coronavirus on the Respiratory Panel, DOES NOT test for the novel  Coronavirus (2019 nCoV)    Coronavirus HKU1 NOT DETECTED NOT DETECTED Final   Coronavirus NL63 NOT DETECTED NOT DETECTED Final   Coronavirus OC43 NOT DETECTED NOT DETECTED Final   Metapneumovirus NOT DETECTED NOT DETECTED Final   Rhinovirus / Enterovirus NOT DETECTED NOT DETECTED Final   Influenza A NOT DETECTED NOT DETECTED Final   Influenza B NOT DETECTED NOT DETECTED Final   Parainfluenza Virus 1 NOT DETECTED NOT DETECTED Final   Parainfluenza Virus 2 NOT DETECTED NOT DETECTED Final   Parainfluenza Virus 3 NOT DETECTED NOT DETECTED Final   Parainfluenza Virus 4 NOT DETECTED NOT DETECTED Final   Respiratory Syncytial  Virus NOT DETECTED NOT DETECTED Final   Bordetella pertussis NOT DETECTED NOT DETECTED Final   Chlamydophila pneumoniae NOT DETECTED NOT DETECTED Final   Mycoplasma pneumoniae NOT DETECTED NOT DETECTED Final    Comment: Performed at [facility], [place] [address], [place], [place] [address]  Urine culture     Status: None   Collection Time: 10/21/18  2:13 PM  Result Value Ref Range Status   Specimen Description   Final    URINE, CATHETERIZED Performed at [facility] [address], [place], [place] [address]    Special Requests NONE  Final   Culture   Final    NO GROWTH Performed at [facility], [place] [address] [place], [place] [address]    Report Status 10/22/2018 FINAL  Final  MRSA PCR Screening     Status: None   Collection Time: 10/21/18  5:51 PM  Result Value Ref Range Status   MRSA by PCR NEGATIVE NEGATIVE Final    Comment:        The GeneXpert MRSA Assay  (FDA approved for NASAL specimens only), is one component of a comprehensive MRSA colonization surveillance program. It is not intended to diagnose MRSA infection nor to guide or monitor treatment for MRSA infections. Performed at [facility], [place] [address] [place], [place] [address]      Studies: Ct Abdomen Pelvis Wo Contrast  Result Date: 10/21/2018 CLINICAL DATA:  Fever with chills, nausea, diarrhea. EXAM: CT CHEST, ABDOMEN AND PELVIS WITHOUT CONTRAST TECHNIQUE: Multidetector CT imaging of the chest, abdomen and pelvis was performed following the standard protocol without IV contrast. COMPARISON:  Abdomen and pelvis CT 07/15/2017. FINDINGS: CT CHEST FINDINGS Cardiovascular: The heart size is normal. No substantial pericardial effusion. Atherosclerotic calcification is noted in the wall of the thoracic aorta. Mediastinum/Nodes: No mediastinal lymphadenopathy. No evidence for gross hilar lymphadenopathy although assessment is limited by the lack of intravenous contrast on today's study. The esophagus has normal imaging features. There is no axillary lymphadenopathy. Lungs/Pleura: The central tracheobronchial airways are patent. Subsegmental atelectasis noted right lower lobe. 4 mm perifissural left lower lobe nodule visible on 102/4. Patchy airspace disease noted posterior left upper lobe with nodular component measuring 11 mm. Probable parahilar atelectasis. No pleural effusion. Musculoskeletal: No worrisome lytic or sclerotic osseous abnormality. Bilateral gynecomastia evident. CT ABDOMEN PELVIS FINDINGS Hepatobiliary: The liver shows diffusely decreased attenuation suggesting steatosis. There is no evidence for gallstones, gallbladder wall thickening, or pericholecystic fluid. No intrahepatic or extrahepatic biliary dilation. Pancreas: No focal mass lesion. No dilatation of the main duct. No intraparenchymal cyst. No peripancreatic edema. Spleen: No splenomegaly. No focal  mass lesion. Adrenals/Urinary Tract: No adrenal nodule or mass. Kidneys unremarkable. No evidence for hydroureter. Tiny gas bubble noted in the urinary bladder. Stomach/Bowel: Stomach is unremarkable. No gastric wall thickening. No evidence of outlet obstruction. Duodenum is normally positioned as is the ligament of Treitz. No small bowel wall thickening. No small bowel dilatation. The terminal ileum is normal. The appendix is normal. No gross colonic mass. No colonic wall thickening. Diverticular changes are noted in the left colon without evidence of diverticulitis. Vascular/Lymphatic: There is abdominal aortic atherosclerosis without aneurysm. Mild lymphadenopathy in the gastrohepatic ligament, measuring 12 mm short axis, is stable in the more than 1 year interval since the prior study. No hepato duodenal ligament lymphadenopathy. No retroperitoneal lymphadenopathy. No pelvic sidewall lymphadenopathy. Reproductive: The prostate gland and seminal vesicles are unremarkable. Other: No intraperitoneal free fluid. Musculoskeletal:  Small left groin hernia contains only fat. No worrisome lytic or sclerotic osseous abnormality. IMPRESSION: 1. Patchy and nodular airspace disease posterior left upper lobe. This is most likely infectious/inflammatory in etiology but given the nodular component, follow-up recommended to ensure complete resolution. 2. Presumed para-aortic atelectasis in the medial left lower lobe could also be reassessed at the time of follow-up. 3. Tiny gas bubble noted in the bladder lumen. Likely related to recent instrumentation, but in the absence of instrumentation, bladder infection could produce this appearance. 4. 4 mm perifissural left lower lobe pulmonary nodule. No follow-up needed if patient is low-risk. Non-contrast chest CT can be considered in 12 months if patient is high-risk. This recommendation follows the consensus statement: Guidelines for Management of Incidental Pulmonary Nodules  Detected on CT Images: From the Fleischner Society 2017; Radiology 2017; 284:228-243. 5. Prominent gastrohepatic ligament lymph node, stable since 07/15/2017, likely reactive. 6. Aortic Atherosclerois (ICD10-170.0) Electronically Signed   By: Misty Stanley M.D.   On: 10/21/2018 17:53   Ct Chest Wo Contrast  Result Date: 10/21/2018 CLINICAL DATA:  Fever with chills, nausea, diarrhea. EXAM: CT CHEST, ABDOMEN AND PELVIS WITHOUT CONTRAST TECHNIQUE: Multidetector CT imaging of the chest, abdomen and pelvis was performed following the standard protocol without IV contrast. COMPARISON:  Abdomen and pelvis CT 07/15/2017. FINDINGS: CT CHEST FINDINGS Cardiovascular: The heart size is normal. No substantial pericardial effusion. Atherosclerotic calcification is noted in the wall of the thoracic aorta. Mediastinum/Nodes: No mediastinal lymphadenopathy. No evidence for gross hilar lymphadenopathy although assessment is limited by the lack of intravenous contrast on today's study. The esophagus has normal imaging features. There is no axillary lymphadenopathy. Lungs/Pleura: The central tracheobronchial airways are patent. Subsegmental atelectasis noted right lower lobe. 4 mm perifissural left lower lobe nodule visible on 102/4. Patchy airspace disease noted posterior left upper lobe with nodular component measuring 11 mm. Probable parahilar atelectasis. No pleural effusion. Musculoskeletal: No worrisome lytic or sclerotic osseous abnormality. Bilateral gynecomastia evident. CT ABDOMEN PELVIS FINDINGS Hepatobiliary: The liver shows diffusely decreased attenuation suggesting steatosis. There is no evidence for gallstones, gallbladder wall thickening, or pericholecystic fluid. No intrahepatic or extrahepatic biliary dilation. Pancreas: No focal mass lesion. No dilatation of the main duct. No intraparenchymal cyst. No peripancreatic edema. Spleen: No splenomegaly. No focal mass lesion. Adrenals/Urinary Tract: No adrenal nodule or  mass. Kidneys unremarkable. No evidence for hydroureter. Tiny gas bubble noted in the urinary bladder. Stomach/Bowel: Stomach is unremarkable. No gastric wall thickening. No evidence of outlet obstruction. Duodenum is normally positioned as is the ligament of Treitz. No small bowel wall thickening. No small bowel dilatation. The terminal ileum is normal. The appendix is normal. No gross colonic mass. No colonic wall thickening. Diverticular changes are noted in the left colon without evidence of diverticulitis. Vascular/Lymphatic: There is abdominal aortic atherosclerosis without aneurysm. Mild lymphadenopathy in the gastrohepatic ligament, measuring 12 mm short axis, is stable in the more than 1 year interval since the prior study. No hepato duodenal ligament lymphadenopathy. No retroperitoneal lymphadenopathy. No pelvic sidewall lymphadenopathy. Reproductive: The prostate gland and seminal vesicles are unremarkable. Other: No intraperitoneal free fluid. Musculoskeletal: Small left groin hernia contains only fat. No worrisome lytic or sclerotic osseous abnormality. IMPRESSION: 1. Patchy and nodular airspace disease posterior left upper lobe. This is most likely infectious/inflammatory in etiology but given the nodular component, follow-up recommended to ensure complete resolution. 2. Presumed para-aortic atelectasis in the medial left lower lobe could also be reassessed at the time of follow-up. 3. Tiny gas bubble  noted in the bladder lumen. Likely related to recent instrumentation, but in the absence of instrumentation, bladder infection could produce this appearance. 4. 4 mm perifissural left lower lobe pulmonary nodule. No follow-up needed if patient is low-risk. Non-contrast chest CT can be considered in 12 months if patient is high-risk. This recommendation follows the consensus statement: Guidelines for Management of Incidental Pulmonary Nodules Detected on CT Images: From the Fleischner Society 2017;  Radiology 2017; 284:228-243. 5. Prominent gastrohepatic ligament lymph node, stable since 07/15/2017, likely reactive. 6. Aortic Atherosclerois (ICD10-170.0) Electronically Signed   By: Misty Stanley M.D.   On: 10/21/2018 17:53   Dg Chest Portable 1 View  Result Date: 10/21/2018 CLINICAL DATA:  68 year old male with a history of nausea chills EXAM: PORTABLE CHEST 1 VIEW COMPARISON:  07/15/2017, 07/06/2017 FINDINGS: Cardiomediastinal silhouette unchanged in size and contour. No pneumothorax. No pleural effusion. No confluent airspace disease. Low lung volumes with coarsened interstitial markings at the lung bases, similar prior. No displaced fracture. IMPRESSION: Chronic changes without evidence of acute cardiopulmonary disease Electronically Signed   By: Corrie Mckusick D.O.   On: 10/21/2018 10:57    Scheduled Meds: . acyclovir  400 mg Oral BID  . aspirin  81 mg Oral Daily  . budesonide  0.5 mg Nebulization BID  . Chlorhexidine Gluconate Cloth  6 each Topical Daily  . enoxaparin (LOVENOX) injection  40 mg Subcutaneous Q24H  . erythromycin  250 mg Oral Q M,W,F  . hydrocortisone sod succinate (SOLU-CORTEF) inj  40 mg Intravenous BID  . loratadine  10 mg Oral Daily  . mouth rinse  15 mL Mouth Rinse BID  . montelukast  10 mg Oral QHS  . pantoprazole  40 mg Oral Daily  . posaconazole  300 mg Oral Daily  . senna  1 tablet Oral BID  . sodium chloride flush  3 mL Intravenous Q12H   Continuous Infusions: . sodium chloride Stopped (10/22/18 0030)  . sodium chloride 75 mL/hr at 10/22/18 1111  . ceFEPime (MAXIPIME) IV    . metronidazole 500 mg (10/22/18 1320)  . vancomycin 1,250 mg (10/22/18 1316)    Principal Problem:   Sepsis ([place]) Active Problems:   MDS (myelodysplastic syndrome) ([place])   History of stem cell transplant ([place])   GVHD (graft versus host disease) ([place])   Lactic acidosis      Shayla D Nettey  [facility]

## 2018-10-23 DIAGNOSIS — I10 Essential (primary) hypertension: Secondary | ICD-10-CM

## 2018-10-23 DIAGNOSIS — J189 Pneumonia, unspecified organism: Secondary | ICD-10-CM | POA: Diagnosis present

## 2018-10-23 DIAGNOSIS — E272 Addisonian crisis: Secondary | ICD-10-CM

## 2018-10-23 LAB — COMPREHENSIVE METABOLIC PANEL
ALT: 19 U/L (ref 0–44)
AST: 18 U/L (ref 15–41)
Albumin: 2.6 g/dL — ABNORMAL LOW (ref 3.5–5.0)
Alkaline Phosphatase: 59 U/L (ref 38–126)
Anion gap: 7 (ref 5–15)
BUN: 12 mg/dL (ref 8–23)
CO2: 18 mmol/L — ABNORMAL LOW (ref 22–32)
Calcium: 7.8 mg/dL — ABNORMAL LOW (ref 8.9–10.3)
Chloride: 114 mmol/L — ABNORMAL HIGH (ref 98–111)
Creatinine, Ser: 0.96 mg/dL (ref 0.61–1.24)
GFR calc Af Amer: >60 mL/min (ref >60)
GFR calc non Af Amer: >60 mL/min (ref >60)
Glucose, Bld: 103 mg/dL — ABNORMAL HIGH (ref 70–99)
Potassium: 3.4 mmol/L — ABNORMAL LOW (ref 3.5–5.1)
Sodium: 139 mmol/L (ref 135–145)
Total Bilirubin: 0.6 mg/dL (ref 0.3–1.2)
Total Protein: 5 g/dL — ABNORMAL LOW (ref 6.5–8.1)

## 2018-10-23 LAB — CBC
HCT: 34.1 % — ABNORMAL LOW (ref 39.0–52.0)
Hemoglobin: 11.3 g/dL — ABNORMAL LOW (ref 13.0–17.0)
MCH: 31.7 pg (ref 26.0–34.0)
MCHC: 33.1 g/dL (ref 30.0–36.0)
MCV: 95.5 fL (ref 80.0–100.0)
Platelets: 181 10*3/uL (ref 150–400)
RBC: 3.57 MIL/uL — ABNORMAL LOW (ref 4.22–5.81)
RDW: 16.5 % — ABNORMAL HIGH (ref 11.5–15.5)
WBC: 6.8 10*3/uL (ref 4.0–10.5)
nRBC: 0 % (ref 0.0–0.2)

## 2018-10-23 LAB — MAGNESIUM: Magnesium: 1.7 mg/dL (ref 1.7–2.4)

## 2018-10-23 LAB — HIV ANTIBODY (ROUTINE TESTING W REFLEX): HIV Screen 4th Generation wRfx: NONREACTIVE

## 2018-10-23 LAB — GLUCOSE, CAPILLARY: Glucose-Capillary: 100 mg/dL — ABNORMAL HIGH (ref 70–99)

## 2018-10-23 MED ORDER — HYDROCORTISONE 20 MG PO TABS
40.0000 mg | ORAL_TABLET | Freq: Two times a day (BID) | ORAL | Status: DC
  Administered 2018-10-23 – 2018-10-25 (×4): 40 mg via ORAL
  Filled 2018-10-23 (×4): qty 2

## 2018-10-23 MED ORDER — POTASSIUM CHLORIDE 20 MEQ PO PACK
40.0000 meq | PACK | ORAL | Status: AC
  Administered 2018-10-23 (×2): 40 meq via ORAL
  Filled 2018-10-23 (×2): qty 2

## 2018-10-23 MED ORDER — SENNA 8.6 MG PO TABS: 1.0000 | ORAL_TABLET | Freq: Every day | ORAL | Status: DC | PRN

## 2018-10-23 MED ORDER — LEVOFLOXACIN 500 MG PO TABS
500.0000 mg | ORAL_TABLET | Freq: Every day | ORAL | Status: DC
  Administered 2018-10-24 – 2018-10-25 (×2): 500 mg via ORAL
  Filled 2018-10-23 (×2): qty 1

## 2018-10-23 NOTE — Progress Notes (Signed)
[facility] PROGRESS NOTE  [name] RSW:[id number] DOB: 05/24/1951 DOA: 10/21/2018 PCP: [name], MD  Assessment/Plan:  Severe sepsis with acute renal failure and lactic acidosis secondary to pneumonia, unclear if typical versus atypical infection, resolved. Presented with tachypnea and hypotension with associated lactic acidosis.  Initial evaluation concern for GI infection however CT abdomen negative.  CT chest did show inflammatory airspace disease concerning for pneumonia. Marland KitchenRVP and strep pneumo unremarklable, Legionella pending. vancomycin DC'd on 4/30 given negative MRCA PCR/blood cultures.  Will discontinue cefepime and Flagyl, transition to Levaquin on 5/1 and monitor this very complicated patient 24 hours to ensure no changes clinically while on regimen.  Will need daily EKG to assess QTC as well  Hypotension with lactic acidosis, resolved. Required 4 L IVF plus solucortef to go from SBP in 80s to normotensive BP. Likely multifactorial related to hypotension from adrenal crisis in the setting of presumed pneumonia.  Will continue stress dose steroids will transition from IV to oral.  Suspected Covid, resolved. COVID testing negative, remains afebrile, no o2 requirement, slight cough more associated with postnasal drip  Acute adrenal crisis in patient with chronic adrenal insufficiency(suspected secondary) due to chronic high dose steroid use with bone marrow transplant for myelodysplastic syndrome, improving. Typically takes hydrocortisone 10 mg over 3 months with no new changes, had stress dose of 100 mg of Solucortef on 4/28 given concern for infection and hypotension, this is very consistent with adrenal crisis.  Given patient is observing well from a GI standpoint will transition from IV to oral hydrocortisone 40 mg twice daily after discussion with his primary oncologist (BMT) Dr. [name].  Will monitor over additional 24 hours to ensure no changes in blood  pressure  Abdominal pain likely related to adrenal crisis, resolved.  CT abdomen unremarkable,.  Not having any current diarrhea or nausea, tolerating oral intake.  Likely resolve given increase in stress dose steroids, likely due to adrenal crisis.  Continue to monitor CMP, add C. difficile if diarrhea resumes.  AKI, prerenal, resolved.  Related to decreased oral intake/Sirs criteria from potential infection.  Creatinine back at baseline.  Monitor BMP  History of stem cell transplant for MDS. Local oncologist is Dr. Lindi Adie, [facility] BMT doctor is Dr[facility] Corena Pilgrim at [facility], aware of plan. Continue hydrocortisone 10 mg, acyclovir 400 mg twice daily, erythromycin 250 mg tablet 3 times a week (MWF) and posaconazole 300 mg daily  HTN, holding home BP meds while monitor BP with changes mentioned above  Chronic cough/sinusitis, stable. Continue flonase, scheduled inhalers   Code Status: FULL CODe Family Communication:  No family at bedside (indicate person spoken with, relationship, and if by phone, the number) Disposition Plan: We will transition to oral antibiotics, continue to monitor on oral hydrocortisone stress dosing over the next 24 hours after discussion with patient's primary BMT oncologist (at [phone]).  Daily EKG to monitor QTC   Consultants:  none  Procedures:  none  Antibiotics:  Vancomycin, cefepime, flagyl, posaconazole, erythromycin (indicate start date, and stop date if known)  HPI/Subjective:  Brandon [name] is a 68 y.o. year old male with medical history significant for chronic adrenal insufficiency, presumed secondary) on hydrocortisone, history of stem cell transplant (7169) complicated by GVHD primarily followed at [place] who presented on 10/21/2018 with reports of subjective fever, chills, nausea, diarrhea and abdominal pain and was found to have persistent hypotension that eventually responded with IV fluids and stress dose Solu-Cortef lactic acidosis as well as Sirs  criteria being met.  No abdominal pain Improvement in appetite Having soft/formed stool   Objective: Vitals:   10/23/18 0800 10/23/18 1427  BP:  133/80  Pulse:  65  Resp:  12  Temp:  98.5 F (36.9 C)  SpO2: 98% 98%    Intake/Output Summary (Last 24 hours) at 10/23/2018 1543 Last data filed at 10/23/2018 0434 Gross per 24 hour  Intake 2373.58 ml  Output -  Net 2373.58 ml   Filed Weights   10/21/18 1023  Weight: 102.1 kg    Exam:   General: Elderly male, no distress  Cardiovascular: Regular rate and rhythm, slight pitting edema of bilateral ankles  Respiratory: Normal respiratory effort on room air, clear breath sounds throughout all lung fields  Abdomen: Soft, nondistended, nontender, diminished bowel sounds  Musculoskeletal: Normal range of motion  Skin no rash or lesions  Neurologic no appreciable focal deficits  Data Reviewed: Basic Metabolic Panel: Recent Labs  Lab 10/17/18 0857 10/21/18 1020 10/22/18 0303 10/23/18 0500  NA 139 136 137 139  K 4.2 3.9 3.6 3.4*  CL 106 107 114* 114*  CO2 23 18* 18* 18*  GLUCOSE 99 162* 87 103*  BUN 10 26* 22 12  CREATININE 1.13 1.80* 1.23 0.96  CALCIUM 9.1 8.6* 7.5* 7.8*  MG 1.8  --   --  1.7   Liver Function Tests: Recent Labs  Lab 10/17/18 0857 10/21/18 1020 10/23/18 0500  AST 24 32 18  ALT 27 32 19  ALKPHOS 140* 108 59  BILITOT 0.7 1.3* 0.6  PROT 6.8 6.4* 5.0*  ALBUMIN 3.4* 3.2* 2.6*   No results for input(s): LIPASE, AMYLASE in the last 168 hours. No results for input(s): AMMONIA in the last 168 hours. CBC: Recent Labs  Lab 10/17/18 0857 10/21/18 1020 10/22/18 0303 10/23/18 0500  WBC 7.6 9.1 7.6 6.8  NEUTROABS 4.1 6.1  --   --   HGB 14.9 16.5 12.0* 11.3*  HCT 44.4 49.9 37.3* 34.1*  MCV 91.0 94.3 95.6 95.5  PLT 270 280 185 181   Cardiac Enzymes: Recent Labs  Lab 10/21/18 1020  TROPONINI 0.03*   BNP (last 3 results) No results for input(s): BNP in the last 8760 hours.  ProBNP  (last 3 results) No results for input(s): PROBNP in the last 8760 hours.  CBG: Recent Labs  Lab 10/21/18 1003  GLUCAP 148*    Recent Results (from the past 240 hour(s))  Culture, blood (routine x 2)     Status: None (Preliminary result)   Collection Time: 10/21/18 10:20 AM  Result Value Ref Range Status   Specimen Description   Final    BLOOD LEFT ANTECUBITAL Performed at [facility] [address] [place], [place] [address]    Special Requests   Final    BOTTLES DRAWN AEROBIC AND ANAEROBIC Blood Culture adequate volume Performed at [facility] [address] [place], [place] [address]    Culture   Final    NO GROWTH 2 DAYS Performed at [facility] [address], [place], [place] [address]    Report Status PENDING  Incomplete  Culture, blood (routine x 2)     Status: None (Preliminary result)   Collection Time: 10/21/18 10:22 AM  Result Value Ref Range Status   Specimen Description   Final    BLOOD RIGHT ANTECUBITAL Performed at [facility] [address], [place], [place] [address]    Special Requests   Final    BOTTLES DRAWN  AEROBIC AND ANAEROBIC Blood Culture adequate volume Performed at [facility] [address], [place], [place] [address]    Culture   Final    NO GROWTH 2 DAYS Performed at [facility] [address], [place], [place] [address]    Report Status PENDING  Incomplete  SARS Coronavirus 2 [facility] order, Performed in [place] hospital lab)     Status: None   Collection Time: 10/21/18 10:59 AM  Result Value Ref Range Status   SARS Coronavirus 2 NEGATIVE NEGATIVE Final    Comment: (NOTE) If result is NEGATIVE SARS-CoV-2 target nucleic acids are NOT DETECTED. The SARS-CoV-2 RNA is generally detectable in upper and lower  respiratory specimens during the acute phase of infection. The lowest  concentration of SARS-CoV-2 viral copies this assay can  detect is 250  copies / mL. A negative result does not preclude SARS-CoV-2 infection  and should not be used as the sole basis for treatment or other  patient management decisions.  A negative result may occur with  improper specimen collection / handling, submission of specimen other  than nasopharyngeal swab, presence of viral mutation(s) within the  areas targeted by this assay, and inadequate number of viral copies  (<250 copies / mL). A negative result must be combined with clinical  observations, patient history, and epidemiological information. If result is POSITIVE SARS-CoV-2 target nucleic acids are DETECTED. The SARS-CoV-2 RNA is generally detectable in upper and lower  respiratory specimens dur ing the acute phase of infection.  Positive  results are indicative of active infection with SARS-CoV-2.  Clinical  correlation with patient history and other diagnostic information is  necessary to determine patient infection status.  Positive results do  not rule out bacterial infection or co-infection with other viruses. If result is PRESUMPTIVE POSTIVE SARS-CoV-2 nucleic acids MAY BE PRESENT.   A presumptive positive result was obtained on the submitted specimen  and confirmed on repeat testing.  While 2019 novel coronavirus  (SARS-CoV-2) nucleic acids may be present in the submitted sample  additional confirmatory testing may be necessary for epidemiological  and / or clinical management purposes  to differentiate between  SARS-CoV-2 and other Sarbecovirus currently known to infect humans.  If clinically indicated additional testing with an alternate test  methodology [phone]) is advised. The SARS-CoV-2 RNA is generally  detectable in upper and lower respiratory sp ecimens during the acute  phase of infection. The expected result is Negative. Fact Sheet for Patients:  StrictlyIdeas.no Fact Sheet for Healthcare  Providers: BankingDealers.co.za This test is not yet approved or cleared by the [place] FDA and has been authorized for detection and/or diagnosis of SARS-CoV-2 by FDA under an Emergency Use Authorization (EUA).  This EUA will remain in effect (meaning this test can be used) for the duration of the COVID-19 declaration under Section 564(b)(1) of the Act, 21 U.S.C. section 360bbb-3(b)(1), unless the authorization is terminated or revoked sooner. Performed at [facility], [place] [address] [place], [place] [address]   Respiratory Panel by PCR     Status: None   Collection Time: 10/21/18 10:59 AM  Result Value Ref Range Status   Adenovirus NOT DETECTED NOT DETECTED Final   Coronavirus 229E NOT DETECTED NOT DETECTED Final    Comment: (NOTE) The Coronavirus on the Respiratory Panel, DOES NOT test for the novel  Coronavirus (2019 nCoV)    Coronavirus HKU1 NOT DETECTED NOT DETECTED Final   Coronavirus NL63 NOT DETECTED NOT DETECTED  Final   Coronavirus OC43 NOT DETECTED NOT DETECTED Final   Metapneumovirus NOT DETECTED NOT DETECTED Final   Rhinovirus / Enterovirus NOT DETECTED NOT DETECTED Final   Influenza A NOT DETECTED NOT DETECTED Final   Influenza B NOT DETECTED NOT DETECTED Final   Parainfluenza Virus 1 NOT DETECTED NOT DETECTED Final   Parainfluenza Virus 2 NOT DETECTED NOT DETECTED Final   Parainfluenza Virus 3 NOT DETECTED NOT DETECTED Final   Parainfluenza Virus 4 NOT DETECTED NOT DETECTED Final   Respiratory Syncytial Virus NOT DETECTED NOT DETECTED Final   Bordetella pertussis NOT DETECTED NOT DETECTED Final   Chlamydophila pneumoniae NOT DETECTED NOT DETECTED Final   Mycoplasma pneumoniae NOT DETECTED NOT DETECTED Final    Comment: Performed at [facility], [place] [address], [place], [place] [address]  Urine culture     Status: None   Collection Time: 10/21/18  2:13 PM  Result Value Ref Range Status   Specimen Description    Final    URINE, CATHETERIZED Performed at [facility] [address], [place], [place] [address]    Special Requests NONE  Final   Culture   Final    NO GROWTH Performed at [facility], [place] [address], [place], [place] [address]    Report Status 10/22/2018 FINAL  Final  MRSA PCR Screening     Status: None   Collection Time: 10/21/18  5:51 PM  Result Value Ref Range Status   MRSA by PCR NEGATIVE NEGATIVE Final    Comment:        The GeneXpert MRSA Assay (FDA approved for NASAL specimens only), is one component of a comprehensive MRSA colonization surveillance program. It is not intended to diagnose MRSA infection nor to guide or monitor treatment for MRSA infections. Performed at [facility], [place] [address] [place], [place] [address]      Studies: Ct Abdomen Pelvis Wo Contrast  Result Date: 10/21/2018 CLINICAL DATA:  Fever with chills, nausea, diarrhea. EXAM: CT CHEST, ABDOMEN AND PELVIS WITHOUT CONTRAST TECHNIQUE: Multidetector CT imaging of the chest, abdomen and pelvis was performed following the standard protocol without IV contrast. COMPARISON:  Abdomen and pelvis CT 07/15/2017. FINDINGS: CT CHEST FINDINGS Cardiovascular: The heart size is normal. No substantial pericardial effusion. Atherosclerotic calcification is noted in the wall of the thoracic aorta. Mediastinum/Nodes: No mediastinal lymphadenopathy. No evidence for gross hilar lymphadenopathy although assessment is limited by the lack of intravenous contrast on today's study. The esophagus has normal imaging features. There is no axillary lymphadenopathy. Lungs/Pleura: The central tracheobronchial airways are patent. Subsegmental atelectasis noted right lower lobe. 4 mm perifissural left lower lobe nodule visible on 102/4. Patchy airspace disease noted posterior left upper lobe with nodular component measuring 11 mm. Probable parahilar atelectasis. No pleural effusion.  Musculoskeletal: No worrisome lytic or sclerotic osseous abnormality. Bilateral gynecomastia evident. CT ABDOMEN PELVIS FINDINGS Hepatobiliary: The liver shows diffusely decreased attenuation suggesting steatosis. There is no evidence for gallstones, gallbladder wall thickening, or pericholecystic fluid. No intrahepatic or extrahepatic biliary dilation. Pancreas: No focal mass lesion. No dilatation of the main duct. No intraparenchymal cyst. No peripancreatic edema. Spleen: No splenomegaly. No focal mass lesion. Adrenals/Urinary Tract: No adrenal nodule or mass. Kidneys unremarkable. No evidence for hydroureter. Tiny gas bubble noted in the urinary bladder. Stomach/Bowel: Stomach is unremarkable. No gastric wall thickening. No evidence of outlet obstruction. Duodenum is normally positioned as is the ligament of Treitz. No small bowel wall thickening. No small  bowel dilatation. The terminal ileum is normal. The appendix is normal. No gross colonic mass. No colonic wall thickening. Diverticular changes are noted in the left colon without evidence of diverticulitis. Vascular/Lymphatic: There is abdominal aortic atherosclerosis without aneurysm. Mild lymphadenopathy in the gastrohepatic ligament, measuring 12 mm short axis, is stable in the more than 1 year interval since the prior study. No hepato duodenal ligament lymphadenopathy. No retroperitoneal lymphadenopathy. No pelvic sidewall lymphadenopathy. Reproductive: The prostate gland and seminal vesicles are unremarkable. Other: No intraperitoneal free fluid. Musculoskeletal: Small left groin hernia contains only fat. No worrisome lytic or sclerotic osseous abnormality. IMPRESSION: 1. Patchy and nodular airspace disease posterior left upper lobe. This is most likely infectious/inflammatory in etiology but given the nodular component, follow-up recommended to ensure complete resolution. 2. Presumed para-aortic atelectasis in the medial left lower lobe could also be  reassessed at the time of follow-up. 3. Tiny gas bubble noted in the bladder lumen. Likely related to recent instrumentation, but in the absence of instrumentation, bladder infection could produce this appearance. 4. 4 mm perifissural left lower lobe pulmonary nodule. No follow-up needed if patient is low-risk. Non-contrast chest CT can be considered in 12 months if patient is high-risk. This recommendation follows the consensus statement: Guidelines for Management of Incidental Pulmonary Nodules Detected on CT Images: From the Fleischner Society 2017; Radiology 2017; 284:228-243. 5. Prominent gastrohepatic ligament lymph node, stable since 07/15/2017, likely reactive. 6. Aortic Atherosclerois (ICD10-170.0) Electronically Signed   By: Misty Stanley M.D.   On: 10/21/2018 17:53   Ct Chest Wo Contrast  Result Date: 10/21/2018 CLINICAL DATA:  Fever with chills, nausea, diarrhea. EXAM: CT CHEST, ABDOMEN AND PELVIS WITHOUT CONTRAST TECHNIQUE: Multidetector CT imaging of the chest, abdomen and pelvis was performed following the standard protocol without IV contrast. COMPARISON:  Abdomen and pelvis CT 07/15/2017. FINDINGS: CT CHEST FINDINGS Cardiovascular: The heart size is normal. No substantial pericardial effusion. Atherosclerotic calcification is noted in the wall of the thoracic aorta. Mediastinum/Nodes: No mediastinal lymphadenopathy. No evidence for gross hilar lymphadenopathy although assessment is limited by the lack of intravenous contrast on today's study. The esophagus has normal imaging features. There is no axillary lymphadenopathy. Lungs/Pleura: The central tracheobronchial airways are patent. Subsegmental atelectasis noted right lower lobe. 4 mm perifissural left lower lobe nodule visible on 102/4. Patchy airspace disease noted posterior left upper lobe with nodular component measuring 11 mm. Probable parahilar atelectasis. No pleural effusion. Musculoskeletal: No worrisome lytic or sclerotic osseous  abnormality. Bilateral gynecomastia evident. CT ABDOMEN PELVIS FINDINGS Hepatobiliary: The liver shows diffusely decreased attenuation suggesting steatosis. There is no evidence for gallstones, gallbladder wall thickening, or pericholecystic fluid. No intrahepatic or extrahepatic biliary dilation. Pancreas: No focal mass lesion. No dilatation of the main duct. No intraparenchymal cyst. No peripancreatic edema. Spleen: No splenomegaly. No focal mass lesion. Adrenals/Urinary Tract: No adrenal nodule or mass. Kidneys unremarkable. No evidence for hydroureter. Tiny gas bubble noted in the urinary bladder. Stomach/Bowel: Stomach is unremarkable. No gastric wall thickening. No evidence of outlet obstruction. Duodenum is normally positioned as is the ligament of Treitz. No small bowel wall thickening. No small bowel dilatation. The terminal ileum is normal. The appendix is normal. No gross colonic mass. No colonic wall thickening. Diverticular changes are noted in the left colon without evidence of diverticulitis. Vascular/Lymphatic: There is abdominal aortic atherosclerosis without aneurysm. Mild lymphadenopathy in the gastrohepatic ligament, measuring 12 mm short axis, is stable in the more than 1 year interval since the prior study. No hepato duodenal  ligament lymphadenopathy. No retroperitoneal lymphadenopathy. No pelvic sidewall lymphadenopathy. Reproductive: The prostate gland and seminal vesicles are unremarkable. Other: No intraperitoneal free fluid. Musculoskeletal: Small left groin hernia contains only fat. No worrisome lytic or sclerotic osseous abnormality. IMPRESSION: 1. Patchy and nodular airspace disease posterior left upper lobe. This is most likely infectious/inflammatory in etiology but given the nodular component, follow-up recommended to ensure complete resolution. 2. Presumed para-aortic atelectasis in the medial left lower lobe could also be reassessed at the time of follow-up. 3. Tiny gas bubble  noted in the bladder lumen. Likely related to recent instrumentation, but in the absence of instrumentation, bladder infection could produce this appearance. 4. 4 mm perifissural left lower lobe pulmonary nodule. No follow-up needed if patient is low-risk. Non-contrast chest CT can be considered in 12 months if patient is high-risk. This recommendation follows the consensus statement: Guidelines for Management of Incidental Pulmonary Nodules Detected on CT Images: From the Fleischner Society 2017; Radiology 2017; 284:228-243. 5. Prominent gastrohepatic ligament lymph node, stable since 07/15/2017, likely reactive. 6. Aortic Atherosclerois (ICD10-170.0) Electronically Signed   By: Misty Stanley M.D.   On: 10/21/2018 17:53    Scheduled Meds: . acyclovir  400 mg Oral BID  . aspirin  81 mg Oral Daily  . budesonide  0.5 mg Nebulization BID  . enoxaparin (LOVENOX) injection  40 mg Subcutaneous Q24H  . erythromycin  250 mg Oral Q M,W,F  . hydrocortisone sod succinate (SOLU-CORTEF) inj  40 mg Intravenous BID  . loratadine  10 mg Oral Daily  . montelukast  10 mg Oral QHS  . pantoprazole  40 mg Oral Daily  . posaconazole  300 mg Oral Daily  . sodium chloride flush  3 mL Intravenous Q12H   Continuous Infusions: . sodium chloride Stopped (10/22/18 0030)  . ceFEPime (MAXIPIME) IV 2 g (10/23/18 0840)  . metronidazole 500 mg (10/23/18 1334)    Principal Problem:   Sepsis ([place]) Active Problems:   Essential hypertension   Steroid-induced hyperglycemia   MDS (myelodysplastic syndrome) ([place])   History of stem cell transplant ([place])   GVHD (graft versus host disease) ([place])   Adrenocortical insufficiency ([facility])   Lactic acidosis      Ambrie Carte D Tyreece Gelles  [facility]

## 2018-10-24 DIAGNOSIS — J189 Pneumonia, unspecified organism: Secondary | ICD-10-CM

## 2018-10-24 LAB — CBC
HCT: 34.6 % — ABNORMAL LOW (ref 39.0–52.0)
Hemoglobin: 11.5 g/dL — ABNORMAL LOW (ref 13.0–17.0)
MCH: 31.6 pg (ref 26.0–34.0)
MCHC: 33.2 g/dL (ref 30.0–36.0)
MCV: 95.1 fL (ref 80.0–100.0)
Platelets: 193 10*3/uL (ref 150–400)
RBC: 3.64 MIL/uL — ABNORMAL LOW (ref 4.22–5.81)
RDW: 16.5 % — ABNORMAL HIGH (ref 11.5–15.5)
WBC: 7.2 10*3/uL (ref 4.0–10.5)
nRBC: 0 % (ref 0.0–0.2)

## 2018-10-24 LAB — COMPREHENSIVE METABOLIC PANEL
ALT: 21 U/L (ref 0–44)
AST: 22 U/L (ref 15–41)
Albumin: 2.8 g/dL — ABNORMAL LOW (ref 3.5–5.0)
Alkaline Phosphatase: 76 U/L (ref 38–126)
Anion gap: 8 (ref 5–15)
BUN: 9 mg/dL (ref 8–23)
CO2: 20 mmol/L — ABNORMAL LOW (ref 22–32)
Calcium: 8 mg/dL — ABNORMAL LOW (ref 8.9–10.3)
Chloride: 112 mmol/L — ABNORMAL HIGH (ref 98–111)
Creatinine, Ser: 0.91 mg/dL (ref 0.61–1.24)
GFR calc Af Amer: >60 mL/min (ref >60)
GFR calc non Af Amer: >60 mL/min (ref >60)
Glucose, Bld: 115 mg/dL — ABNORMAL HIGH (ref 70–99)
Potassium: 3.2 mmol/L — ABNORMAL LOW (ref 3.5–5.1)
Sodium: 140 mmol/L (ref 135–145)
Total Bilirubin: 1 mg/dL (ref 0.3–1.2)
Total Protein: 5.1 g/dL — ABNORMAL LOW (ref 6.5–8.1)

## 2018-10-24 LAB — MAGNESIUM: Magnesium: 1.8 mg/dL (ref 1.7–2.4)

## 2018-10-24 LAB — LEGIONELLA PNEUMOPHILA SEROGP 1 UR AG: L. pneumophila Serogp 1 Ur Ag: NEGATIVE

## 2018-10-24 MED ORDER — POTASSIUM CHLORIDE 20 MEQ PO PACK
40.0000 meq | PACK | ORAL | Status: AC
  Administered 2018-10-24 (×2): 40 meq via ORAL
  Filled 2018-10-24 (×2): qty 2

## 2018-10-24 MED ORDER — MAGNESIUM OXIDE 400 (241.3 MG) MG PO TABS
400.0000 mg | ORAL_TABLET | Freq: Every day | ORAL | Status: DC
  Administered 2018-10-24 – 2018-10-25 (×2): 400 mg via ORAL
  Filled 2018-10-24 (×2): qty 1

## 2018-10-24 MED ORDER — MAGNESIUM OXIDE 400 MG PO CAPS: 400.0000 mg | ORAL_CAPSULE | Freq: Every day | ORAL | Status: DC

## 2018-10-24 NOTE — Progress Notes (Signed)
[facility] PROGRESS NOTE  Brandon [name] CHE:[id number] DOB: 1951/02/23 DOA: 10/21/2018 PCP: [name], MD  Assessment/Plan:  Severe sepsis with acute renal failure and lactic acidosis secondary to pneumonia, unclear if typical versus atypical infection, resolved. Presented with tachypnea and hypotension with associated lactic acidosis.  Initial evaluation concern for GI infection however CT abdomen negative.  CT chest did show inflammatory airspace disease concerning for pneumonia. Marland KitchenRVP and strep pneumo unremarklable, Legionella pending.  Will transition to [place] now that vancomycin, cefepime and Flagyl have been discontinued and monitor this very complicated patient 24 hours to ensure no changes clinically while on regimen.  Will need daily EKG to assess QTC as well  Hypotension with lactic acidosis, resolved. Required 4 L IVF plus solucortef to go from SBP in 80s to normotensive BP. Likely multifactorial related to hypotension from adrenal crisis in the setting of presumed pneumonia.  Will continue oral stress dose steroids.  We will taper as outpatient per discussion with his primary BMT oncologist.  Suspected Covid, resolved. COVID testing negative, remains afebrile, no o2 requirement, slight cough more associated with postnasal drip  Acute adrenal crisis in patient with chronic adrenal insufficiency(suspected secondary) due to chronic high dose steroid use with bone marrow transplant for myelodysplastic syndrome, improving.  We will continue high-dose hydrocortisone given adrenal crisis in the setting of pneumonia.  Continue hydrocortisone 40 mg twice daily oral, will have taper as outpatient per discussion with his primary oncologist (BMT)  Dr. [name] (on 4/30) continue to monitor blood pressure  Abdominal pain likely related to adrenal crisis, resolved.  CT abdomen unremarkable,.  Not having any current diarrhea or nausea, tolerating oral intake.  Likely resolve given  increase in stress dose steroids, likely due to adrenal crisis.  Continue to monitor CMP, add C. difficile if diarrhea resumes.  AKI, prerenal, resolved.  Related to decreased oral intake/Sirs criteria from potential infection.  Creatinine back at baseline.  Monitor BMP  History of stem cell transplant for MDS. Local oncologist is Dr. Lindi Adie, [facility] BMT doctor is [facility] Corena Pilgrim at [facility], aware of plan. Continue hydrocortisone 10 mg, acyclovir 400 mg twice daily, erythromycin 250 mg tablet 3 times a week (MWF) and posaconazole 300 mg daily  HTN, holding home BP meds while monitor BP with changes mentioned above  Chronic cough/sinusitis, stable. Continue flonase, scheduled inhalers  Hypokalemia.  Like related to chronic hypomagnesemia.  Resume home magnesium, replete potassium and closely monitor.  Code Status: FULL CODe Family Communication:  No family at bedside (indicate person spoken with, relationship, and if by phone, the number) Disposition Plan: On oral antibiotics, continue to monitor on oral hydrocortisone stress dosing as well as oral Levaquin over the next 24 hours after discussion with patient's primary BMT oncologist (at [phone]).  Daily EKG to monitor QTC   Consultants:  none  Procedures:  none  Antibiotics:  Vancomycin, cefepime, flagyl, posaconazole, erythromycin (indicate start date, and stop date if known)  Levaquin 5/1  HPI/Subjective:  Brandon [name] is a 68 y.o. year old male with medical history significant for chronic adrenal insufficiency, presumed secondary) on hydrocortisone, history of stem cell transplant (0086) complicated by GVHD primarily followed at [facility] who presented on 10/21/2018 with reports of subjective fever, chills, nausea, diarrhea and abdominal pain and was found to have persistent hypotension that eventually responded with IV fluids and stress dose Solu-Cortef lactic acidosis as well as Sirs criteria being met.  No fevers, no chills No  abdominal pain No diarrhea Minimal cough  Objective: Vitals:   10/24/18 0515 10/24/18 0745  BP: 132/82   Pulse: (!) 50   Resp: 20   Temp: 98.3 F (36.8 C)   SpO2: 96% 97%    Intake/Output Summary (Last 24 hours) at 10/24/2018 1140 Last data filed at 10/24/2018 7408 Gross per 24 hour  Intake 440 ml  Output -  Net 440 ml   Filed Weights   10/21/18 1023  Weight: 102.1 kg    Exam:   General: Elderly male, no distress  Cardiovascular: Regular rate and rhythm,   Respiratory: Normal respiratory effort on room air, clear breath sounds throughout all lung fields  Abdomen: Soft, nondistended, nontender, diminished bowel sounds  Musculoskeletal: Normal range of motion  Skin no rash or lesions  Neurologic no appreciable focal deficits  Data Reviewed: Basic Metabolic Panel: Recent Labs  Lab 10/21/18 1020 10/22/18 0303 10/23/18 0500 10/24/18 0455  NA 136 137 139 140  K 3.9 3.6 3.4* 3.2*  CL 107 114* 114* 112*  CO2 18* 18* 18* 20*  GLUCOSE 162* 87 103* 115*  BUN 26* _0 CREATININE 1.80* 1.23 0.96 0.91  CALCIUM 8.6* 7.5* 7.8* 8.0*  MG  --   --  1.7 1.8   Liver Function Tests: Recent Labs  Lab 10/21/18 1020 10/23/18 0500 10/24/18 0455  AST 32 18 22  ALT 32 19 21  ALKPHOS 108 59 76  BILITOT 1.3* 0.6 1.0  PROT 6.4* 5.0* 5.1*  ALBUMIN 3.2* 2.6* 2.8*   No results for input(s): LIPASE, AMYLASE in the last 168 hours. No results for input(s): AMMONIA in the last 168 hours. CBC: Recent Labs  Lab 10/21/18 1020 10/22/18 0303 10/23/18 0500 10/24/18 0455  WBC 9.1 7.6 6.8 7.2  NEUTROABS 6.1  --   --   --   HGB 16.5 12.0* 11.3* 11.5*  HCT 49.9 37.3* 34.1* 34.6*  MCV 94.3 95.6 95.5 95.1  PLT 280 185 181 193   Cardiac Enzymes: Recent Labs  Lab 10/21/18 1020  TROPONINI 0.03*   BNP (last 3 results) No results for input(s): BNP in the last 8760 hours.  ProBNP (last 3 results) No results for input(s): PROBNP in the last 8760 hours.  CBG: Recent  Labs  Lab 10/21/18 1003 10/23/18 1651  GLUCAP 148* 100*    Recent Results (from the past 240 hour(s))  Culture, blood (routine x 2)     Status: None (Preliminary result)   Collection Time: 10/21/18 10:20 AM  Result Value Ref Range Status   Specimen Description   Final    BLOOD LEFT ANTECUBITAL Performed at [facility] [address] [place], [place] [address]    Special Requests   Final    BOTTLES DRAWN AEROBIC AND ANAEROBIC Blood Culture adequate volume Performed at [facility] [address] [place], [place] [address]    Culture   Final    NO GROWTH 2 DAYS Performed at [facility] [address] [place], [place] [address]    Report Status PENDING  Incomplete  Culture, blood (routine x 2)     Status: None (Preliminary result)   Collection Time: 10/21/18 10:22 AM  Result Value Ref Range Status   Specimen Description   Final    BLOOD RIGHT ANTECUBITAL Performed at [facility] [address], [place], [place] [address]    Special Requests   Final    BOTTLES DRAWN AEROBIC AND ANAEROBIC Blood Culture adequate volume Performed at [facility]  [place] [address], [place], [place] [address]    Culture   Final    NO GROWTH 2 DAYS Performed at [facility] [address], [place], [place] [address]    Report Status PENDING  Incomplete  SARS Coronavirus 2 [facility] order, Performed in [facility] hospital lab)     Status: None   Collection Time: 10/21/18 10:59 AM  Result Value Ref Range Status   SARS Coronavirus 2 NEGATIVE NEGATIVE Final    Comment: (NOTE) If result is NEGATIVE SARS-CoV-2 target nucleic acids are NOT DETECTED. The SARS-CoV-2 RNA is generally detectable in upper and lower  respiratory specimens during the acute phase of infection. The lowest  concentration of SARS-CoV-2 viral copies this assay can detect is 250  copies / mL. A negative result does not preclude  SARS-CoV-2 infection  and should not be used as the sole basis for treatment or other  patient management decisions.  A negative result may occur with  improper specimen collection / handling, submission of specimen other  than nasopharyngeal swab, presence of viral mutation(s) within the  areas targeted by this assay, and inadequate number of viral copies  (<250 copies / mL). A negative result must be combined with clinical  observations, patient history, and epidemiological information. If result is POSITIVE SARS-CoV-2 target nucleic acids are DETECTED. The SARS-CoV-2 RNA is generally detectable in upper and lower  respiratory specimens dur ing the acute phase of infection.  Positive  results are indicative of active infection with SARS-CoV-2.  Clinical  correlation with patient history and other diagnostic information is  necessary to determine patient infection status.  Positive results do  not rule out bacterial infection or co-infection with other viruses. If result is PRESUMPTIVE POSTIVE SARS-CoV-2 nucleic acids MAY BE PRESENT.   A presumptive positive result was obtained on the submitted specimen  and confirmed on repeat testing.  While 2019 novel coronavirus  (SARS-CoV-2) nucleic acids may be present in the submitted sample  additional confirmatory testing may be necessary for epidemiological  and / or clinical management purposes  to differentiate between  SARS-CoV-2 and other Sarbecovirus currently known to infect humans.  If clinically indicated additional testing with an alternate test  methodology [phone]) is advised. The SARS-CoV-2 RNA is generally  detectable in upper and lower respiratory sp ecimens during the acute  phase of infection. The expected result is Negative. Fact Sheet for Patients:  StrictlyIdeas.no Fact Sheet for Healthcare Providers: BankingDealers.co.za This test is not yet approved or cleared by the  [place] FDA and has been authorized for detection and/or diagnosis of SARS-CoV-2 by FDA under an Emergency Use Authorization (EUA).  This EUA will remain in effect (meaning this test can be used) for the duration of the COVID-19 declaration under Section 564(b)(1) of the Act, 21 U.S.C. section 360bbb-3(b)(1), unless the authorization is terminated or revoked sooner. Performed at [facility], [place] [address], [place], [place] [address]   Respiratory Panel by PCR     Status: None   Collection Time: 10/21/18 10:59 AM  Result Value Ref Range Status   Adenovirus NOT DETECTED NOT DETECTED Final   Coronavirus 229E NOT DETECTED NOT DETECTED Final    Comment: (NOTE) The Coronavirus on the Respiratory Panel, DOES NOT test for the novel  Coronavirus (2019 nCoV)    Coronavirus HKU1 NOT DETECTED NOT DETECTED Final   Coronavirus NL63 NOT DETECTED NOT DETECTED Final   Coronavirus OC43 NOT DETECTED NOT DETECTED Final  Metapneumovirus NOT DETECTED NOT DETECTED Final   Rhinovirus / Enterovirus NOT DETECTED NOT DETECTED Final   Influenza A NOT DETECTED NOT DETECTED Final   Influenza B NOT DETECTED NOT DETECTED Final   Parainfluenza Virus 1 NOT DETECTED NOT DETECTED Final   Parainfluenza Virus 2 NOT DETECTED NOT DETECTED Final   Parainfluenza Virus 3 NOT DETECTED NOT DETECTED Final   Parainfluenza Virus 4 NOT DETECTED NOT DETECTED Final   Respiratory Syncytial Virus NOT DETECTED NOT DETECTED Final   Bordetella pertussis NOT DETECTED NOT DETECTED Final   Chlamydophila pneumoniae NOT DETECTED NOT DETECTED Final   Mycoplasma pneumoniae NOT DETECTED NOT DETECTED Final    Comment: Performed at [facility], [place] [address] [place], [place] [address]  Urine culture     Status: None   Collection Time: 10/21/18  2:13 PM  Result Value Ref Range Status   Specimen Description   Final    URINE, CATHETERIZED Performed at [facility] [address]  [place], [place] [address]    Special Requests NONE  Final   Culture   Final    NO GROWTH Performed at [facility], [place] [address] [place], [place] [address]    Report Status 10/22/2018 FINAL  Final  MRSA PCR Screening     Status: None   Collection Time: 10/21/18  5:51 PM  Result Value Ref Range Status   MRSA by PCR NEGATIVE NEGATIVE Final    Comment:        The GeneXpert MRSA Assay (FDA approved for NASAL specimens only), is one component of a comprehensive MRSA colonization surveillance program. It is not intended to diagnose MRSA infection nor to guide or monitor treatment for MRSA infections. Performed at [facility], [place] [address] [place], [place] [address]   Culture, blood (routine x 2)     Status: None (Preliminary result)   Collection Time: 10/23/18  5:16 PM  Result Value Ref Range Status   Specimen Description   Final    BLOOD SITE NOT SPECIFIED Performed at [facility], [place] [address] [place], [place] [address]    Special Requests   Final    BOTTLES DRAWN AEROBIC ONLY Blood Culture adequate volume Performed at [facility] [address], [place], [place] [address]    Culture PENDING  Incomplete   Report Status PENDING  Incomplete  Culture, blood (routine x 2)     Status: None (Preliminary result)   Collection Time: 10/23/18  5:21 PM  Result Value Ref Range Status   Specimen Description   Final    BLOOD SITE NOT SPECIFIED Performed at [facility], [address], [place], [place] [address]    Special Requests   Final    BOTTLES DRAWN AEROBIC ONLY Blood Culture adequate volume Performed at [facility] [address], [place], [place] [address]    Culture PENDING  Incomplete   Report Status PENDING  Incomplete     Studies: No results found.  Scheduled Meds: . acyclovir  400 mg Oral BID  . aspirin  81 mg Oral Daily  . budesonide  0.5 mg Nebulization BID  . enoxaparin  (LOVENOX) injection  40 mg Subcutaneous Q24H  . erythromycin  250 mg Oral Q M,W,F  . hydrocortisone  40 mg Oral BID  . levofloxacin  500 mg Oral Daily  . loratadine  10 mg Oral Daily  . magnesium oxide  400 mg Oral  Daily  . montelukast  10 mg Oral QHS  . pantoprazole  40 mg Oral Daily  . posaconazole  300 mg Oral Daily  . potassium chloride  40 mEq Oral Q2H  . sodium chloride flush  3 mL Intravenous Q12H   Continuous Infusions: . sodium chloride Stopped (10/22/18 0030)    Principal Problem:   Sepsis ([employer]) Active Problems:   Essential hypertension   Steroid-induced hyperglycemia   MDS (myelodysplastic syndrome) ([place])   History of stem cell transplant ([place])   GVHD (graft versus host disease) ([place])   Adrenocortical insufficiency ([facility])   Lactic acidosis   Acute adrenal crisis ([place])   Pneumonia      Shayla D Nettey  [facility]

## 2018-10-24 NOTE — Progress Notes (Signed)
RN notified pt had 14 beat run of atrial tachycardia. NP made aware. Pt is asymptomatic. Will continue to monitor.

## 2018-10-25 ENCOUNTER — Encounter: Payer: Self-pay | Admitting: Family Medicine

## 2018-10-25 LAB — COMPREHENSIVE METABOLIC PANEL
ALT: 28 U/L (ref 0–44)
AST: 31 U/L (ref 15–41)
Albumin: 2.9 g/dL — ABNORMAL LOW (ref 3.5–5.0)
Alkaline Phosphatase: 89 U/L (ref 38–126)
Anion gap: 7 (ref 5–15)
BUN: 8 mg/dL (ref 8–23)
CO2: 23 mmol/L (ref 22–32)
Calcium: 8.2 mg/dL — ABNORMAL LOW (ref 8.9–10.3)
Chloride: 109 mmol/L (ref 98–111)
Creatinine, Ser: 0.94 mg/dL (ref 0.61–1.24)
GFR calc Af Amer: >60 mL/min (ref >60)
GFR calc non Af Amer: >60 mL/min (ref >60)
Glucose, Bld: 107 mg/dL — ABNORMAL HIGH (ref 70–99)
Potassium: 3.5 mmol/L (ref 3.5–5.1)
Sodium: 139 mmol/L (ref 135–145)
Total Bilirubin: 1.1 mg/dL (ref 0.3–1.2)
Total Protein: 5.5 g/dL — ABNORMAL LOW (ref 6.5–8.1)

## 2018-10-25 MED ORDER — HYDROCORTISONE 20 MG PO TABS: 40.0000 mg | ORAL_TABLET | Freq: Two times a day (BID) | ORAL | 0 refills | Status: AC

## 2018-10-25 MED ORDER — LEVOFLOXACIN 500 MG PO TABS: 500.0000 mg | ORAL_TABLET | Freq: Every day | ORAL | 0 refills | Status: AC

## 2018-10-25 NOTE — Discharge Summary (Signed)
Discharge Summary  Brandon [name] XKG:[id number] DOB: 05-12-1951  PCP: [name], MD  Admit date: 10/21/2018 Discharge date: 10/25/2018   Time spent: < 25 minutes  Admitted From: Home Disposition:  Home  Recommendations for Outpatient Follow-up:  1. Follow up with PCP in 1 week 2. Will arrange follow up ( most likely virtual) with his oncologist 3. New medications, hydrocortisone 40 mg twice daily until seen by oncologist to initiate taper regimen, Levaquin additional 5 days 4.     Discharge Diagnoses:  Active Hospital Problems   Diagnosis Date Noted   Sepsis ([place]) 07/15/2017   Acute adrenal crisis ([place]) 10/23/2018   Pneumonia 10/23/2018   Lactic acidosis 10/21/2018   Adrenocortical insufficiency ([place]) 08/06/2017   History of stem cell transplant ([place]) 03/04/2017   GVHD (graft versus host disease) ([place]) 03/04/2017   MDS (myelodysplastic syndrome) ([place]) 12/06/2015   Steroid-induced hyperglycemia 08/16/2014   Essential hypertension 02/04/2007    Resolved Hospital Problems  No resolved problems to display.    Discharge Condition: Stable   CODE STATUS:FULL   History of present illness:  Brandon [name] is a 68 y.o. year old male with medical history significant for chronic adrenal insufficiency, presumed secondary) on hydrocortisone, history of stem cell transplant (7026) complicated by GVHD primarily followed at [facility] who presented on 10/21/2018 with reports of subjective fever, chills, nausea, diarrhea and abdominal pain and was found to have sepsis presumed secondary to pneumonia complicated by adrenal crisis in the setting of chronic adrenal insufficiency.    Remaining hospital course addressed in problem based format below:   Hospital Course:   Severe sepsis with acute renal failure and lactic acidosis secondary to pneumonia, unclear if typical versus atypical infection, resolved. Presented with tachypnea and hypotension with associated lactic  acidosis.  Initial evaluation concern for GI infection however CT abdomen negative.  CT chest did show inflammatory airspace disease concerning for pneumonia. .RVP, Legionella and strep pneumo unremarklable, Legionella pending.  Blood cultures x2 were obtained and had no growth.  Patient was empirically treated with vancomycin, cefepime, Flagyl given initial unclear etiology however with opacity found on CT chest patient was treated with pneumonia and de-escalate to Levaquin therapy and remained afebrile. He did not require oxygen throughout hospital stay.   Will continue Levaquin for additional 5 days to complete 7 days total therapy.  Will transition to [place] now that vancomycin, cefepime and Flagyl have been discontinued and monitor this very complicated patient 24 hours to ensure no changes clinically while on regimen.  Will need daily EKG to assess QTC as well   Suspected Covid, resolved. COVID testing negative, remains afebrile, no o2 requirement, slight cough more associated with postnasal drip  Acute adrenal crisis in patient with chronic adrenal insufficiency(suspected secondary) due to chronic high dose steroid use with bone marrow transplant for myelodysplastic syndrome, improving.  Required 4 L IVF plus solucortef to go from SBP in 80s to normotensive BP. Likely multifactorial related to hypotension from adrenal crisis in the setting of presumed pneumonia.    Patient was able to transition from IV Solu-Cortef to hydrocortisone, and discussion with patient's oncologist he was transitioned to oral hydrocortisone 40 mg twice daily which she will continue on discharge until follow-up.    Abdominal pain likely related to adrenal crisis, resolved.  CT abdomen unremarkable,.  No diarrhea during admission, tolerating oral intake.  Likely resolved given increase in stress dose steroids, likely due to adrenal crisis.   AKI, prerenal, resolved.  Related to  decreased oral intake/sepsis from  pneumonia.  Creatinine back at baseline (0.94 on discharge)  History of stem cell transplant for MDS. Local oncologist is Dr. Lindi Adie, [facility] doctor is Dr[facility] Corena Pilgrim at [facility], aware of plan. Acyclovir 400 mg twice daily, erythromycin 250 mg tablet 3 times a week (MWF)and posaconazole 300 mg daily  HTN, blood pressure rebounded after initiation of IV stress dose steroids and maintain normotensive blood pressure while on oral regimen.  Patient can resume home BP medications on discharge.  Chronic cough, stable. Continue flonase, scheduled inhalers  Hypokalemia/hypomagnesemia, resolved.  Like related to chronic hypomagnesemia.  Resume home magnesium, replete potassium and closely monitor.  Consultations:  None  Procedures/Studies: none  Discharge Exam: BP (!) 141/91 (BP Location: Right Arm)    Pulse (!) 51    Temp 98.6 F (37 C) (Oral)    Resp 18    Ht 5' 10.5" (1.791 m)    Wt 102.1 kg    SpO2 95%    BMI 31.83 kg/m    General: Elderly male, no distress  Cardiovascular: bradycardic, normal rhythm, slight edema at ankles bilaterally  Respiratory: Normal respiratory effort on room air, clear breath sounds throughout all lung fields  Abdomen: Soft, nondistended, nontender, diminished bowel sounds  Musculoskeletal: Normal range of motion  Skin no rash or lesions  Neurologic no appreciable focal deficits   Discharge Instructions You were cared for by a hospitalist during your hospital stay. If you have any questions about your discharge medications or the care you received while you were in the hospital after you are discharged, you can call the unit and asked to speak with the hospitalist on call if the hospitalist that took care of you is not available. Once you are discharged, your primary care physician will handle any further medical issues. Please note that NO REFILLS for any discharge medications will be authorized once you are discharged, as it is imperative that you  return to your primary care physician (or establish a relationship with a primary care physician if you do not have one) for your aftercare needs so that they can reassess your need for medications and monitor your lab values.  Discharge Instructions    Diet - low sodium heart healthy   Complete by:  As directed    Increase activity slowly   Complete by:  As directed      Allergies as of 10/25/2018   No Known Allergies     Medication List    TAKE these medications   acyclovir 400 MG tablet Commonly known as:  ZOVIRAX Take 400 mg by mouth 2 (two) times daily.   albuterol 108 (90 Base) MCG/ACT inhaler Commonly known as:  VENTOLIN HFA Inhale into the lungs every 6 (six) hours as needed for wheezing or shortness of breath.   amLODipine 2.5 MG tablet Commonly known as:  NORVASC Take 1 tablet (2.5 mg total) by mouth daily.   aspirin 81 MG chewable tablet Chew by mouth daily.   atenolol 50 MG tablet Commonly known as:  TENORMIN Take 0.5 tablets (25 mg total) by mouth every evening. What changed:  how much to take   Claritin 10 MG tablet Generic drug:  loratadine Take 10 mg by mouth daily.   erythromycin 250 MG tablet Commonly known as:  E-MYCIN Take 250 mg by mouth as directed. Monday, Wednesday, Friday   fluticasone 220 MCG/ACT inhaler Commonly known as:  FLOVENT HFA Inhale into the lungs 2 (two) times daily.   fluticasone  50 MCG/ACT nasal spray Commonly known as:  FLONASE Place 2 sprays into both nostrils daily as needed for allergies or rhinitis.   hydrocortisone 20 MG tablet Commonly known as:  CORTEF Take 2 tablets (40 mg total) by mouth 2 (two) times daily for 14 days. What changed:    medication strength  how much to take  how to take this  when to take this  additional instructions   levofloxacin 500 MG tablet Commonly known as:  LEVAQUIN Take 1 tablet (500 mg total) by mouth daily for 5 days. Start taking on:  Oct 26, 2018   Magnesium Oxide 400  MG Caps Take 1 capsule (400 mg total) by mouth 5 (five) times daily. What changed:  when to take this   montelukast 10 MG tablet Commonly known as:  SINGULAIR Take 10 mg by mouth at bedtime.   omeprazole 20 MG tablet Commonly known as:  PriLOSEC OTC Take 2 tablets (40 mg total) by mouth 2 (two) times daily. take 1 tablet by mouth once daily   omeprazole 40 MG capsule Commonly known as:  PRILOSEC Take 40 mg by mouth 2 (two) times daily.   ondansetron 8 MG disintegrating tablet Commonly known as:  ZOFRAN-ODT Take 8 mg by mouth every 8 (eight) hours as needed for nausea or vomiting.   polyvinyl alcohol 1.4 % ophthalmic solution Commonly known as:  LIQUIFILM TEARS Place 1 drop into both eyes as needed for dry eyes.   posaconazole 100 MG Tbec delayed-release tablet Commonly known as:  NOXAFIL Take 300 mg by mouth daily.   senna-docusate 8.6-50 MG tablet Commonly known as:  Senokot-S Take 2 tablets by mouth Nightly.   Vitamin D 50 MCG (2000 UT) Caps Take 2,000 Units by mouth daily.      No Known Allergies    The results of significant diagnostics from this hospitalization (including imaging, microbiology, ancillary and laboratory) are listed below for reference.    Significant Diagnostic Studies: Ct Abdomen Pelvis Wo Contrast  Result Date: 10/21/2018 CLINICAL DATA:  Fever with chills, nausea, diarrhea. EXAM: CT CHEST, ABDOMEN AND PELVIS WITHOUT CONTRAST TECHNIQUE: Multidetector CT imaging of the chest, abdomen and pelvis was performed following the standard protocol without IV contrast. COMPARISON:  Abdomen and pelvis CT 07/15/2017. FINDINGS: CT CHEST FINDINGS Cardiovascular: The heart size is normal. No substantial pericardial effusion. Atherosclerotic calcification is noted in the wall of the thoracic aorta. Mediastinum/Nodes: No mediastinal lymphadenopathy. No evidence for gross hilar lymphadenopathy although assessment is limited by the lack of intravenous contrast on  today's study. The esophagus has normal imaging features. There is no axillary lymphadenopathy. Lungs/Pleura: The central tracheobronchial airways are patent. Subsegmental atelectasis noted right lower lobe. 4 mm perifissural left lower lobe nodule visible on 102/4. Patchy airspace disease noted posterior left upper lobe with nodular component measuring 11 mm. Probable parahilar atelectasis. No pleural effusion. Musculoskeletal: No worrisome lytic or sclerotic osseous abnormality. Bilateral gynecomastia evident. CT ABDOMEN PELVIS FINDINGS Hepatobiliary: The liver shows diffusely decreased attenuation suggesting steatosis. There is no evidence for gallstones, gallbladder wall thickening, or pericholecystic fluid. No intrahepatic or extrahepatic biliary dilation. Pancreas: No focal mass lesion. No dilatation of the main duct. No intraparenchymal cyst. No peripancreatic edema. Spleen: No splenomegaly. No focal mass lesion. Adrenals/Urinary Tract: No adrenal nodule or mass. Kidneys unremarkable. No evidence for hydroureter. Tiny gas bubble noted in the urinary bladder. Stomach/Bowel: Stomach is unremarkable. No gastric wall thickening. No evidence of outlet obstruction. Duodenum is normally positioned as is the ligament  of Treitz. No small bowel wall thickening. No small bowel dilatation. The terminal ileum is normal. The appendix is normal. No gross colonic mass. No colonic wall thickening. Diverticular changes are noted in the left colon without evidence of diverticulitis. Vascular/Lymphatic: There is abdominal aortic atherosclerosis without aneurysm. Mild lymphadenopathy in the gastrohepatic ligament, measuring 12 mm short axis, is stable in the more than 1 year interval since the prior study. No hepato duodenal ligament lymphadenopathy. No retroperitoneal lymphadenopathy. No pelvic sidewall lymphadenopathy. Reproductive: The prostate gland and seminal vesicles are unremarkable. Other: No intraperitoneal free fluid.  Musculoskeletal: Small left groin hernia contains only fat. No worrisome lytic or sclerotic osseous abnormality. IMPRESSION: 1. Patchy and nodular airspace disease posterior left upper lobe. This is most likely infectious/inflammatory in etiology but given the nodular component, follow-up recommended to ensure complete resolution. 2. Presumed para-aortic atelectasis in the medial left lower lobe could also be reassessed at the time of follow-up. 3. Tiny gas bubble noted in the bladder lumen. Likely related to recent instrumentation, but in the absence of instrumentation, bladder infection could produce this appearance. 4. 4 mm perifissural left lower lobe pulmonary nodule. No follow-up needed if patient is low-risk. Non-contrast chest CT can be considered in 12 months if patient is high-risk. This recommendation follows the consensus statement: Guidelines for Management of Incidental Pulmonary Nodules Detected on CT Images: From the Fleischner Society 2017; Radiology 2017; 284:228-243. 5. Prominent gastrohepatic ligament lymph node, stable since 07/15/2017, likely reactive. 6. Aortic Atherosclerois (ICD10-170.0) Electronically Signed   By: Misty Stanley M.D.   On: 10/21/2018 17:53   Ct Chest Wo Contrast  Result Date: 10/21/2018 CLINICAL DATA:  Fever with chills, nausea, diarrhea. EXAM: CT CHEST, ABDOMEN AND PELVIS WITHOUT CONTRAST TECHNIQUE: Multidetector CT imaging of the chest, abdomen and pelvis was performed following the standard protocol without IV contrast. COMPARISON:  Abdomen and pelvis CT 07/15/2017. FINDINGS: CT CHEST FINDINGS Cardiovascular: The heart size is normal. No substantial pericardial effusion. Atherosclerotic calcification is noted in the wall of the thoracic aorta. Mediastinum/Nodes: No mediastinal lymphadenopathy. No evidence for gross hilar lymphadenopathy although assessment is limited by the lack of intravenous contrast on today's study. The esophagus has normal imaging features. There  is no axillary lymphadenopathy. Lungs/Pleura: The central tracheobronchial airways are patent. Subsegmental atelectasis noted right lower lobe. 4 mm perifissural left lower lobe nodule visible on 102/4. Patchy airspace disease noted posterior left upper lobe with nodular component measuring 11 mm. Probable parahilar atelectasis. No pleural effusion. Musculoskeletal: No worrisome lytic or sclerotic osseous abnormality. Bilateral gynecomastia evident. CT ABDOMEN PELVIS FINDINGS Hepatobiliary: The liver shows diffusely decreased attenuation suggesting steatosis. There is no evidence for gallstones, gallbladder wall thickening, or pericholecystic fluid. No intrahepatic or extrahepatic biliary dilation. Pancreas: No focal mass lesion. No dilatation of the main duct. No intraparenchymal cyst. No peripancreatic edema. Spleen: No splenomegaly. No focal mass lesion. Adrenals/Urinary Tract: No adrenal nodule or mass. Kidneys unremarkable. No evidence for hydroureter. Tiny gas bubble noted in the urinary bladder. Stomach/Bowel: Stomach is unremarkable. No gastric wall thickening. No evidence of outlet obstruction. Duodenum is normally positioned as is the ligament of Treitz. No small bowel wall thickening. No small bowel dilatation. The terminal ileum is normal. The appendix is normal. No gross colonic mass. No colonic wall thickening. Diverticular changes are noted in the left colon without evidence of diverticulitis. Vascular/Lymphatic: There is abdominal aortic atherosclerosis without aneurysm. Mild lymphadenopathy in the gastrohepatic ligament, measuring 12 mm short axis, is stable in the more than 1  year interval since the prior study. No hepato duodenal ligament lymphadenopathy. No retroperitoneal lymphadenopathy. No pelvic sidewall lymphadenopathy. Reproductive: The prostate gland and seminal vesicles are unremarkable. Other: No intraperitoneal free fluid. Musculoskeletal: Small left groin hernia contains only fat. No  worrisome lytic or sclerotic osseous abnormality. IMPRESSION: 1. Patchy and nodular airspace disease posterior left upper lobe. This is most likely infectious/inflammatory in etiology but given the nodular component, follow-up recommended to ensure complete resolution. 2. Presumed para-aortic atelectasis in the medial left lower lobe could also be reassessed at the time of follow-up. 3. Tiny gas bubble noted in the bladder lumen. Likely related to recent instrumentation, but in the absence of instrumentation, bladder infection could produce this appearance. 4. 4 mm perifissural left lower lobe pulmonary nodule. No follow-up needed if patient is low-risk. Non-contrast chest CT can be considered in 12 months if patient is high-risk. This recommendation follows the consensus statement: Guidelines for Management of Incidental Pulmonary Nodules Detected on CT Images: From the Fleischner Society 2017; Radiology 2017; 284:228-243. 5. Prominent gastrohepatic ligament lymph node, stable since 07/15/2017, likely reactive. 6. Aortic Atherosclerois (ICD10-170.0) Electronically Signed   By: Misty Stanley M.D.   On: 10/21/2018 17:53   Dg Chest Portable 1 View  Result Date: 10/21/2018 CLINICAL DATA:  68 year old male with a history of nausea chills EXAM: PORTABLE CHEST 1 VIEW COMPARISON:  07/15/2017, 07/06/2017 FINDINGS: Cardiomediastinal silhouette unchanged in size and contour. No pneumothorax. No pleural effusion. No confluent airspace disease. Low lung volumes with coarsened interstitial markings at the lung bases, similar prior. No displaced fracture. IMPRESSION: Chronic changes without evidence of acute cardiopulmonary disease Electronically Signed   By: Corrie Mckusick D.O.   On: 10/21/2018 10:57    Microbiology: Recent Results (from the past 240 hour(s))  Culture, blood (routine x 2)     Status: None (Preliminary result)   Collection Time: 10/21/18 10:20 AM  Result Value Ref Range Status   Specimen Description    Final    BLOOD LEFT ANTECUBITAL Performed at [name] [address] [place], [place] [address]    Special Requests   Final    BOTTLES DRAWN AEROBIC AND ANAEROBIC Blood Culture adequate volume Performed at [facility] [address], [place], [place] [address]    Culture   Final    NO GROWTH 4 DAYS Performed at [facility], [place] [address], [place], [place] [address]    Report Status PENDING  Incomplete  Culture, blood (routine x 2)     Status: None (Preliminary result)   Collection Time: 10/21/18 10:22 AM  Result Value Ref Range Status   Specimen Description   Final    BLOOD RIGHT ANTECUBITAL Performed at [facility] [address] [place], [place] [address]    Special Requests   Final    BOTTLES DRAWN AEROBIC AND ANAEROBIC Blood Culture adequate volume Performed at [facility] [address], [place], [place] [address]    Culture   Final    NO GROWTH 4 DAYS Performed at [facility], [place] [address], [place], [place] [address]    Report Status PENDING  Incomplete  SARS Coronavirus 2 [facility] order, Performed in [facility] hospital lab)     Status: None   Collection Time: 10/21/18 10:59 AM  Result Value Ref Range Status   SARS Coronavirus 2 NEGATIVE NEGATIVE Final    Comment: (NOTE) If result is NEGATIVE SARS-CoV-2 target nucleic acids are NOT  DETECTED. The SARS-CoV-2 RNA is generally detectable in upper and lower  respiratory specimens during the acute phase of infection. The lowest  concentration of SARS-CoV-2 viral copies this assay can detect is 250  copies / mL. A negative result does not preclude SARS-CoV-2 infection  and should not be used as the sole basis for treatment or other  patient management decisions.  A negative result may occur with  improper specimen collection / handling, submission of specimen other  than nasopharyngeal swab, presence of viral  mutation(s) within the  areas targeted by this assay, and inadequate number of viral copies  (<250 copies / mL). A negative result must be combined with clinical  observations, patient history, and epidemiological information. If result is POSITIVE SARS-CoV-2 target nucleic acids are DETECTED. The SARS-CoV-2 RNA is generally detectable in upper and lower  respiratory specimens dur ing the acute phase of infection.  Positive  results are indicative of active infection with SARS-CoV-2.  Clinical  correlation with patient history and other diagnostic information is  necessary to determine patient infection status.  Positive results do  not rule out bacterial infection or co-infection with other viruses. If result is PRESUMPTIVE POSTIVE SARS-CoV-2 nucleic acids MAY BE PRESENT.   A presumptive positive result was obtained on the submitted specimen  and confirmed on repeat testing.  While 2019 novel coronavirus  (SARS-CoV-2) nucleic acids may be present in the submitted sample  additional confirmatory testing may be necessary for epidemiological  and / or clinical management purposes  to differentiate between  SARS-CoV-2 and other Sarbecovirus currently known to infect humans.  If clinically indicated additional testing with an alternate test  methodology [phone]) is advised. The SARS-CoV-2 RNA is generally  detectable in upper and lower respiratory sp ecimens during the acute  phase of infection. The expected result is Negative. Fact Sheet for Patients:  StrictlyIdeas.no Fact Sheet for Healthcare Providers: BankingDealers.co.za This test is not yet approved or cleared by the [place] FDA and has been authorized for detection and/or diagnosis of SARS-CoV-2 by FDA under an Emergency Use Authorization (EUA).  This EUA will remain in effect (meaning this test can be used) for the duration of the COVID-19 declaration under Section 564(b)(1)  of the Act, 21 U.S.C. section 360bbb-3(b)(1), unless the authorization is terminated or revoked sooner. Performed at [facility], [place] [address], [place], [place] [address]   Respiratory Panel by PCR     Status: None   Collection Time: 10/21/18 10:59 AM  Result Value Ref Range Status   Adenovirus NOT DETECTED NOT DETECTED Final   Coronavirus 229E NOT DETECTED NOT DETECTED Final    Comment: (NOTE) The Coronavirus on the Respiratory Panel, DOES NOT test for the novel  Coronavirus (2019 nCoV)    Coronavirus HKU1 NOT DETECTED NOT DETECTED Final   Coronavirus NL63 NOT DETECTED NOT DETECTED Final   Coronavirus OC43 NOT DETECTED NOT DETECTED Final   Metapneumovirus NOT DETECTED NOT DETECTED Final   Rhinovirus / Enterovirus NOT DETECTED NOT DETECTED Final   Influenza A NOT DETECTED NOT DETECTED Final   Influenza B NOT DETECTED NOT DETECTED Final   Parainfluenza Virus 1 NOT DETECTED NOT DETECTED Final   Parainfluenza Virus 2 NOT DETECTED NOT DETECTED Final   Parainfluenza Virus 3 NOT DETECTED NOT DETECTED Final   Parainfluenza Virus 4 NOT DETECTED NOT DETECTED Final   Respiratory Syncytial Virus NOT DETECTED NOT DETECTED Final   Bordetella pertussis NOT DETECTED NOT DETECTED Final   Chlamydophila pneumoniae  NOT DETECTED NOT DETECTED Final   Mycoplasma pneumoniae NOT DETECTED NOT DETECTED Final    Comment: Performed at [facility], [place] [address], [place], [place] [address]  Urine culture     Status: None   Collection Time: 10/21/18  2:13 PM  Result Value Ref Range Status   Specimen Description   Final    URINE, CATHETERIZED Performed at [facility] [address] [place], [place] [address]    Special Requests NONE  Final   Culture   Final    NO GROWTH Performed at [facility], [place] [address] [place], [place] [address]    Report Status 10/22/2018 FINAL  Final  MRSA PCR Screening     Status: None   Collection Time: 10/21/18   5:51 PM  Result Value Ref Range Status   MRSA by PCR NEGATIVE NEGATIVE Final    Comment:        The GeneXpert MRSA Assay (FDA approved for NASAL specimens only), is one component of a comprehensive MRSA colonization surveillance program. It is not intended to diagnose MRSA infection nor to guide or monitor treatment for MRSA infections. Performed at [facility], [place] [address], [place], [place] [address]   Culture, blood (routine x 2)     Status: None (Preliminary result)   Collection Time: 10/23/18  5:16 PM  Result Value Ref Range Status   Specimen Description   Final    BLOOD SITE NOT SPECIFIED Performed at [facility], [place] [address] [place], [place] [address]    Special Requests   Final    BOTTLES DRAWN AEROBIC ONLY Blood Culture adequate volume Performed at [facility] [address] [place], [place] [id number]    Culture   Final    NO GROWTH 2 DAYS Performed at [facility] [address], [place], [place] [address]    Report Status PENDING  Incomplete  Culture, blood (routine x 2)     Status: None (Preliminary result)   Collection Time: 10/23/18  5:21 PM  Result Value Ref Range Status   Specimen Description   Final    BLOOD SITE NOT SPECIFIED Performed at [facility], [place] [address] [place], [place] [address]    Special Requests   Final    BOTTLES DRAWN AEROBIC ONLY Blood Culture adequate volume Performed at [facility] [address] [place],  [address]    Culture   Final    NO GROWTH 2 DAYS Performed at [facility] [address] [place],  [address]    Report Status PENDING  Incomplete     Labs: Basic Metabolic Panel: Recent Labs  Lab 10/21/18 1020 10/22/18 0303 10/23/18 0500 10/24/18 0455 10/25/18 0504  NA 136 137 139 140 139  K 3.9 3.6 3.4* 3.2* 3.5  CL 107 114* 114* 112* 109  CO2 18* 18* 18* 20* 23  GLUCOSE 162* 87 103* 115* 107*  BUN  26* 22 12 9 8   CREATININE 1.80* 1.23 0.96 0.91 0.94  CALCIUM 8.6* 7.5* 7.8* 8.0* 8.2*  MG  --   --  1.7 1.8  --    Liver Function Tests: Recent Labs  Lab 10/21/18 1020 10/23/18 0500 10/24/18 0455 10/25/18 0504  AST 32 18 22 31   ALT 32 19 21 28   ALKPHOS 108 59 76 89  BILITOT 1.3* 0.6 1.0 1.1  PROT 6.4* 5.0* 5.1* 5.5*  ALBUMIN  3.2* 2.6* 2.8* 2.9*   No results for input(s): LIPASE, AMYLASE in the last 168 hours. No results for input(s): AMMONIA in the last 168 hours. CBC: Recent Labs  Lab 10/21/18 1020 10/22/18 0303 10/23/18 0500 10/24/18 0455  WBC 9.1 7.6 6.8 7.2  NEUTROABS 6.1  --   --   --   HGB 16.5 12.0* 11.3* 11.5*  HCT 49.9 37.3* 34.1* 34.6*  MCV 94.3 95.6 95.5 95.1  PLT 280 185 181 193   Cardiac Enzymes: Recent Labs  Lab 10/21/18 1020  TROPONINI 0.03*   BNP: BNP (last 3 results) No results for input(s): BNP in the last 8760 hours.  ProBNP (last 3 results) No results for input(s): PROBNP in the last 8760 hours.  CBG: Recent Labs  Lab 10/21/18 1003 10/23/18 1651  GLUCAP 148* 100*       Signed:  Desiree Hane, MD [facility] 10/25/2018, 11:54 AM

## 2018-10-25 NOTE — Progress Notes (Signed)
Went over discharge papers with patient.  All questions answered.  VSS. Wheeled out via NT.  

## 2018-10-26 LAB — CULTURE, BLOOD (ROUTINE X 2)
Culture: NO GROWTH
Culture: NO GROWTH
Special Requests: ADEQUATE
Special Requests: ADEQUATE

## 2018-10-27 ENCOUNTER — Telehealth: Payer: Self-pay

## 2018-10-27 NOTE — Telephone Encounter (Signed)
I spokw with patient today and scheduled him a follow up visit with Dr. Yong Channel.  Scheduled for next week.

## 2018-10-27 NOTE — Telephone Encounter (Signed)
Noted thanks °

## 2018-10-27 NOTE — Telephone Encounter (Signed)
Per Chart Review:  Admit date: 10/21/2018 Discharge date: 10/25/2018   Time spent: < 25 minutes  Admitted From: Home Disposition:  Home  Recommendations for Outpatient Follow-up:  1. Follow up with PCP in 1 week 2. Will arrange follow up ( most likely virtual) with his oncologist 3. New medications, hydrocortisone 40 mg twice daily until seen by oncologist to initiate taper regimen, Levaquin additional 5 days 4.     Discharge Diagnoses:      Active Hospital Problems   Diagnosis Date Noted  . Sepsis ([place]) 07/15/2017  . Acute adrenal crisis ([place]) 10/23/2018  . Pneumonia 10/23/2018  . Lactic acidosis 10/21/2018  . Adrenocortical insufficiency ([place]) 08/06/2017  . History of stem cell transplant ([place]) 03/04/2017  . 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 MDS (myelodysplastic syndrome) ([place]) 12/06/2015  . Steroid-induced hyperglycemia 08/16/2014  . Essential hypertension 02/04/2007    Resolved Hospital Problems  No resolved problems to display.    Discharge Condition: Stable   CODE STATUS:FULL   Per Telephone Call:  Transition Care Management Follow-up Telephone Call   Date discharged?  10/25/2018   How have you been since you were released from the hospital? Much better.  "I'm on so many steroids, I don't care much about anything."   Do you understand why you were in the hospital? yes   Do you understand the discharge instructions? yes   Where were you discharged to? Home   Items Reviewed:  Medications reviewed: yes  Allergies reviewed: yes  Dietary changes reviewed: yes  Referrals reviewed: yes   Functional Questionnaire:   Activities of Daily Living (ADLs):   He states they are independent in the following: ambulation, bathing and hygiene, feeding, continence, grooming, toileting and dressing States they require assistance with the following: N/A   Any transportation issues/concerns?: no   Any patient concerns?  no   Confirmed importance and date/time of follow-up visits scheduled yes  Provider Appointment booked with Dr. Yong Channel on 11/03/2018 @ 10:00 am  Confirmed with patient if condition begins to worsen call PCP or go to the ER.  Patient was given the office number and encouraged to call back with question or concerns.  : yes

## 2018-10-28 LAB — CULTURE, BLOOD (ROUTINE X 2)
Culture: NO GROWTH
Culture: NO GROWTH
Special Requests: ADEQUATE
Special Requests: ADEQUATE

## 2018-10-30 LAB — ALDOSTERONE + RENIN ACTIVITY W/ RATIO
ALDO / PRA Ratio: <0.5 (ref 0.0–30.0)
Aldosterone: <1 ng/dL (ref 0.0–30.0)
PRA LC/MS/MS: 2.139 ng/mL/hr (ref 0.167–5.380)

## 2018-10-31 ENCOUNTER — Encounter: Payer: Self-pay | Admitting: Family Medicine

## 2018-11-03 ENCOUNTER — Ambulatory Visit (INDEPENDENT_AMBULATORY_CARE_PROVIDER_SITE_OTHER): Payer: Medicare Other | Admitting: Family Medicine

## 2018-11-03 ENCOUNTER — Encounter: Payer: Self-pay | Admitting: Family Medicine

## 2018-11-03 VITALS — BP 139/83 | HR 58 | Temp 97.8°F | Ht 70.5 in | Wt 226.0 lb

## 2018-11-03 DIAGNOSIS — E2740 Unspecified adrenocortical insufficiency: Secondary | ICD-10-CM

## 2018-11-03 DIAGNOSIS — T380X5A Adverse effect of glucocorticoids and synthetic analogues, initial encounter: Secondary | ICD-10-CM

## 2018-11-03 DIAGNOSIS — E785 Hyperlipidemia, unspecified: Secondary | ICD-10-CM

## 2018-11-03 DIAGNOSIS — I10 Essential (primary) hypertension: Secondary | ICD-10-CM | POA: Diagnosis not present

## 2018-11-03 DIAGNOSIS — E872 Acidosis, unspecified: Secondary | ICD-10-CM

## 2018-11-03 DIAGNOSIS — R739 Hyperglycemia, unspecified: Secondary | ICD-10-CM

## 2018-11-03 DIAGNOSIS — J189 Pneumonia, unspecified organism: Secondary | ICD-10-CM | POA: Diagnosis not present

## 2018-11-03 NOTE — Assessment & Plan Note (Signed)
Hospitalist team was kind enough to check lipids for [place]. Lab Results  Component Value Date   CHOL 244 (H) 10/17/2018   HDL 43 10/17/2018   LDLCALC 145 (H) 10/17/2018   LDLDIRECT 92.0 05/04/2015   TRIG 282 (H) 10/17/2018   CHOLHDL 5.7 10/17/2018  Significant elevation noted-[facility] has been okay with fenofibrate.  Instead of starting fenofibrate we opted to focus on healthy eating and regular exercise.  Patient has been advised-NO STATIN while on posaconazole- due to liver risks per [facility].

## 2018-11-03 NOTE — Progress Notes (Signed)
Phone [phone]   Subjective:  Virtual visit via Video note for TCM. Chief complaint: Chief Complaint  Patient presents with  . Hospitalization Follow-up- pneumonia/adrenal insufficiency   This visit type was conducted due to national recommendations for restrictions regarding the COVID-19 Pandemic (e.g. social distancing).  This format is felt to be [name] appropriate for this patient at this time balancing risks to patient and risks to population by having him in for in person visit.  No physical exam was performed (except for noted visual exam or audio findings with Telehealth visits).    Our team/I connected with Brandon [name] on 11/03/18 at 10:00 AM EDT by a video enabled telemedicine application ([email or web address]) and verified that I am speaking with the correct person using two identifiers.  Location patient: Home-[facility] Location provider: [facility], office Persons participating in the virtual visit:  patient  Our team/I discussed the limitations of evaluation and management by telemedicine and the availability of in person appointments. In light of current covid-19 pandemic, patient also understands that we are trying to protect them by minimizing in office contact if at all possible.  The patient expressed consent for telemedicine visit and agreed to proceed. Patient understands insurance will be billed.   Brandon [name] is a 68 y.o. year old very pleasant male patient who presents for transitional care management and hospital follow up for pneumonia leading to adrenal crisis. Patient was hospitalized from October 21, 2018 to Oct 25, 2018. A TCM phone call was completed on Oct 27, 2018. Medical complexity moderate  Brandon [name] is a 68 year old male with history of chronic adrenal insufficiency, history of stem cell transplant in 3762 complicated by graft-versus-host disease followed at [facility]-patient presented to the emergency room with subjective fever, chills, nausea, diarrhea, abdominal pain and  was found to be septic secondary to pneumonia-this was complicated by adrenal crisis and patient with chronic adrenal insufficiency.  In the emergency room he was found to be hypotensive with lactic acidosis.  With above symptoms there was some concern for GI infection-CT abdomen and pelvis was negative.  CT of chest did show inflammatory airspace disease concerning for pneumonia.  Respiratory viral panel, Legionella, strep pneumo were negative along with Legionella ultimately.  Patient did require 4 L of IV fluid initially along with Solu-Cortef to help him become normotensive.  Abdominal pain was thought to be related to adrenal crisis-resolved before discharge.  Diarrhea resolved during admission.  Patient was admitted and found to be in severe sepsis with acute renal failure and lactic acidosis secondary to pneumonia.  Patient was treated with broad range antibiotics-vancomycin, cefepime, Flagyl.  He was eventually de-escalated to Levaquin therapy and remained afebrile.  Did not require oxygen during hospital stay.  He was treated with 7 days of antibiotics total.  His COVID-19 testing was negative.  Patient did report chronic cough for a few months before hospitalization-this has resolved since being home.  Adrenal crisis was treated with Solu-Cortef initially but later transitioned to home hydrocortisone- he was discharged on 40 mg twice daily with a taper to be determined by [facility] endocrinology.  Patient's acute kidney injury was considered prerenal in etiology- resolved with hydration.  Had some electrolyte abnormalities including potassium magnesium but these were repleted.  History of stem cell transplant for mellitus blastic syndrome-follows with Dr. Payton Mccallum locally in [place]. Jeri Lager was kept in the loop during hospitalization.  Patient was continued on acyclovir, erythromycin, posaconazole per baseline  Hypertension-initially hypotensive but became normotensive with IV steroids.  Patient was advised to resume home medications upon discharge.  So far at home has had some elevations into the 140s.  Steroid-induced hyperglycemia- treated with sliding scale while hospitalized.  Sugars at home have been typically below 120   See problem oriented charting as well ROS- cough has resolved since being home from hospital. No fever/chills. No chest pain or shortness of breath   Past Medical History-  Patient Active Problem List   Diagnosis Date Noted  . Adrenocortical insufficiency ([place]) 08/06/2017    Priority: High  . 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 MDS (myelodysplastic syndrome) ([place]) 12/06/2015    Priority: High  . Steroid-induced hyperglycemia 08/16/2014    Priority: High  . History of DVT (deep vein thrombosis) 09/25/2018    Priority: Medium  . Post chemotherapy Dry eyes due to decreased tear production 08/06/2017    Priority: Medium  . post chemotherapy Peripheral neuropathy 08/06/2017    Priority: Medium  . BPH associated with nocturia 11/15/2014    Priority: Medium  . Hyperlipidemia 02/04/2007    Priority: Medium  . Essential hypertension 02/04/2007    Priority: Medium  . History of stem cell transplant ([place]) 03/04/2017    Priority: Low  . Leukopenia 11/15/2014    Priority: Low  . Chest pain 08/13/2014    Priority: Low  . GERD (gastroesophageal reflux disease) 05/03/2014    Priority: Low  . Cervical disc disorder with radiculopathy of cervical region 07/14/2010    Priority: Low  . MIXED HEARING LOSS BILATERAL 07/14/2010    Priority: Low  . ERECTILE DYSFUNCTION 02/07/2007    Priority: Low    Medications- reviewed and updated  A medical reconciliation was performed comparing current medicines to hospital discharge medications. Current Outpatient Medications  Medication Sig Dispense Refill  . acyclovir (ZOVIRAX) 400 MG tablet Take 400 mg by mouth 2 (two) times daily.    Marland Kitchen albuterol (PROVENTIL HFA;VENTOLIN  HFA) 108 (90 Base) MCG/ACT inhaler Inhale into the lungs every 6 (six) hours as needed for wheezing or shortness of breath.    Marland Kitchen amLODipine (NORVASC) 2.5 MG tablet Take 1 tablet (2.5 mg total) by mouth daily. 90 tablet 3  . aspirin 81 MG chewable tablet Chew by mouth daily.    Marland Kitchen atenolol (TENORMIN) 50 MG tablet Take 0.5 tablets (25 mg total) by mouth every evening. (Patient taking differently: Take 50 mg by mouth every evening. )    . azithromycin (ZITHROMAX) 250 MG tablet     . Cholecalciferol (VITAMIN D) 50 MCG (2000 UT) CAPS Take 2,000 Units by mouth daily.    . DENTA 5000 PLUS 1.1 % CREA dental cream APPLY PEA SIZE AMOUNT TO TOOTHBRUSH & BRUSH THOROUGH FOR 2 MIN DAILY, IN PLACE OF REGULAR TOOTHPASTE    . erythromycin (E-MYCIN) 250 MG tablet Take 250 mg by mouth as directed. Monday, Wednesday, Friday    . fluticasone (FLONASE) 50 MCG/ACT nasal spray Place 2 sprays into both nostrils daily as needed for allergies or rhinitis.    . fluticasone (FLOVENT HFA) 220 MCG/ACT inhaler Inhale into the lungs 2 (two) times daily.    . hydrocortisone (CORTEF) 20 MG tablet Take 2 tablets (40 mg total) by mouth 2 (two) times daily for 14 days. 56 tablet 0  . ketorolac (ACULAR) 0.5 % ophthalmic solution     . loratadine (CLARITIN) 10 MG tablet Take 10 mg by mouth daily.    . Magnesium Oxide 400 MG CAPS Take 1  capsule (400 mg total) by mouth 5 (five) times daily. (Patient taking differently: Take 400 mg by mouth daily. )    . montelukast (SINGULAIR) 10 MG tablet Take 10 mg by mouth at bedtime.    Marland Kitchen omeprazole (PRILOSEC OTC) 20 MG tablet Take 2 tablets (40 mg total) by mouth 2 (two) times daily. take 1 tablet by mouth once daily    . omeprazole (PRILOSEC) 40 MG capsule Take 40 mg by mouth 2 (two) times daily.    . ondansetron (ZOFRAN-ODT) 8 MG disintegrating tablet Take 8 mg by mouth every 8 (eight) hours as needed for nausea or vomiting.    . polyvinyl alcohol (LIQUIFILM TEARS) 1.4 % ophthalmic solution Place 1  drop into both eyes as needed for dry eyes.    . posaconazole (NOXAFIL) 100 MG TBEC delayed-release tablet Take 300 mg by mouth daily.    Marland Kitchen senna-docusate (SENOKOT-S) 8.6-50 MG tablet Take 2 tablets by mouth Nightly.     . Lancets (ONETOUCH DELICA PLUS OACZYS06T) MISC      No current facility-administered medications for this visit.    Objective  Objective:  BP 139/83   Pulse (!) 58   Temp 97.8 F (36.6 C) (Oral)   Ht 5' 10.5" (1.791 m)   Wt 226 lb (102.5 kg)   BMI 31.97 kg/m  Gen: NAD, resting comfortably in his home CV: Bradycardic per home pulse Lungs: Normal respiratory rate, nonlabored  abdomen: Nondistended Skin: warm, dry, no rash Neuro: Normal speech   Assessment and Plan:   #Pneumonia- patient adequately treated with Levaquin-appears to have resolved.  Sepsis related to pneumonia has also resolved.  #Adrenal insufficiency-adrenal crisis has resolved.  Patient is on a taper per [facility] endocrinology on hydrocortisone-Step down on 20mg /20mg  AM/PM for 3 days and then on 12th goes to 20/10 and then on 15th goes to 15/5 and then on 22nd goes to 10/5 stay on that until next visit. Had been on hydrocortisone 10 mg for 3 months-they want to rapidly taper to try to get back to close to this level.  He will continue double dose if moderately ill- if fever/chills-50 mg immediately.   #Acute kidney injury- prerenal in etiology-resolved before discharge with creatinine down to 0.94- I do not think repeat at this time is necessary.  #History of stem cell transplant due to MDS- patient will continue to follow-up with [facility].  For graft-versus-host disease-he will remain on  #Hypertension- mild poorly controlled on home numbers.  Instead of increasing medication we opted to see how blood pressure does as patient titrates down on hydrocortisone-if it remains up when he gets back to new baseline-she will let me know and we will make adjustments as appropriate.  He is currently on atenolol 25 mg  and amlodipine 2.5 mg-continue current medication for now   #Steroid-induced hyperglycemia-fortunately patient has been able to remain off of insulin and is doing quite well.  Home blood sugars are well controlled.  Continue to monitor.   #Hyperlipidemia/obesity Hospitalist team was kind enough to check lipids for [place]. Lab Results  Component Value Date   CHOL 244 (H) 10/17/2018   HDL 43 10/17/2018   LDLCALC 145 (H) 10/17/2018   LDLDIRECT 92.0 05/04/2015   TRIG 282 (H) 10/17/2018   CHOLHDL 5.7 10/17/2018  Significant elevation noted-[facility] has been okay with fenofibrate.  Instead of starting fenofibrate we opted to focus on healthy eating and regular exercise.  Patient has been advised-NO STATIN while on posaconazole- due to liver risks per  [facility].  For weight- Encouraged need for healthy eating, regular exercise, weight loss.     Next follow-up as needed-patient following up closely with [facility] and will let us know when he needs our services- would recommend at least every 68-month check in Lab/Order associations: Pneumonia due to infectious organism, unspecified laterality, unspecified part of lung  Essential hypertension  Hyperlipidemia, unspecified hyperlipidemia type  Steroid-induced hyperglycemia  Lactic acidosis  Return precautions advised.  Garret Reddish, MD

## 2018-11-03 NOTE — Patient Instructions (Addendum)
Health Maintenance Due  Topic Date Due  . URINE MICROALBUMIN-not truly diabetic so we will not obtain 08/26/1960  . TETANUS/TDAP Pt will have this done at [facility] at next visit.  12/06/2015    Depression screen [facility] 2/9 09/24/2018 03/22/2017 11/15/2014  Decreased Interest 0 0 0  Down, Depressed, Hopeless 0 0 0  PHQ - 2 Score 0 0 0

## 2018-11-03 NOTE — Assessment & Plan Note (Signed)
mild poorly controlled on home numbers.  Instead of increasing medication we opted to see how blood pressure does as patient titrates down on hydrocortisone-if it remains up when he gets back to new baseline-she will let me know and we will make adjustments as appropriate.  He is currently on atenolol 25 mg and amlodipine 2.5 mg-continue current medication for now

## 2018-11-04 ENCOUNTER — Other Ambulatory Visit: Payer: Self-pay | Admitting: *Deleted

## 2018-11-04 DIAGNOSIS — D469 Myelodysplastic syndrome, unspecified: Secondary | ICD-10-CM

## 2018-11-05 ENCOUNTER — Other Ambulatory Visit: Payer: Self-pay

## 2018-11-05 ENCOUNTER — Emergency Department (HOSPITAL_COMMUNITY): Payer: Medicare Other

## 2018-11-05 ENCOUNTER — Inpatient Hospital Stay (HOSPITAL_COMMUNITY)
Admission: EM | Admit: 2018-11-05 | Discharge: 2018-11-07 | DRG: 644 | Disposition: A | Discharge status: Home / Self Care | Payer: Medicare Other | Attending: Internal Medicine | Admitting: Internal Medicine

## 2018-11-05 DIAGNOSIS — Z8701 Personal history of pneumonia (recurrent): Secondary | ICD-10-CM

## 2018-11-05 DIAGNOSIS — I10 Essential (primary) hypertension: Secondary | ICD-10-CM

## 2018-11-05 DIAGNOSIS — Z803 Family history of malignant neoplasm of breast: Secondary | ICD-10-CM

## 2018-11-05 DIAGNOSIS — D89813 Graft-versus-host disease, unspecified: Secondary | ICD-10-CM

## 2018-11-05 DIAGNOSIS — Z823 Family history of stroke: Secondary | ICD-10-CM

## 2018-11-05 DIAGNOSIS — R0602 Shortness of breath: Secondary | ICD-10-CM | POA: Diagnosis not present

## 2018-11-05 DIAGNOSIS — Z7982 Long term (current) use of aspirin: Secondary | ICD-10-CM

## 2018-11-05 DIAGNOSIS — Z79899 Other long term (current) drug therapy: Secondary | ICD-10-CM

## 2018-11-05 DIAGNOSIS — R112 Nausea with vomiting, unspecified: Secondary | ICD-10-CM

## 2018-11-05 DIAGNOSIS — Z9481 Bone marrow transplant status: Secondary | ICD-10-CM

## 2018-11-05 DIAGNOSIS — Z7951 Long term (current) use of inhaled steroids: Secondary | ICD-10-CM

## 2018-11-05 DIAGNOSIS — E2740 Unspecified adrenocortical insufficiency: Secondary | ICD-10-CM | POA: Diagnosis present

## 2018-11-05 DIAGNOSIS — R1110 Vomiting, unspecified: Secondary | ICD-10-CM

## 2018-11-05 DIAGNOSIS — E272 Addisonian crisis: Secondary | ICD-10-CM | POA: Diagnosis not present

## 2018-11-05 DIAGNOSIS — Z7952 Long term (current) use of systemic steroids: Secondary | ICD-10-CM

## 2018-11-05 DIAGNOSIS — Z820 Family history of epilepsy and other diseases of the nervous system: Secondary | ICD-10-CM

## 2018-11-05 DIAGNOSIS — Z20828 Contact with and (suspected) exposure to other viral communicable diseases: Secondary | ICD-10-CM | POA: Diagnosis present

## 2018-11-05 DIAGNOSIS — Z9484 Stem cells transplant status: Secondary | ICD-10-CM

## 2018-11-05 DIAGNOSIS — E872 Acidosis: Secondary | ICD-10-CM | POA: Diagnosis present

## 2018-11-05 LAB — COMPREHENSIVE METABOLIC PANEL
ALT: 39 U/L (ref 0–44)
AST: 42 U/L — ABNORMAL HIGH (ref 15–41)
Albumin: 4.1 g/dL (ref 3.5–5.0)
Alkaline Phosphatase: 122 U/L (ref 38–126)
Anion gap: 13 (ref 5–15)
BUN: 18 mg/dL (ref 8–23)
CO2: 21 mmol/L — ABNORMAL LOW (ref 22–32)
Calcium: 9.3 mg/dL (ref 8.9–10.3)
Chloride: 104 mmol/L (ref 98–111)
Creatinine, Ser: 1.32 mg/dL — ABNORMAL HIGH (ref 0.61–1.24)
GFR calc Af Amer: >60 mL/min (ref >60)
GFR calc non Af Amer: 55 mL/min — ABNORMAL LOW (ref >60)
Glucose, Bld: 115 mg/dL — ABNORMAL HIGH (ref 70–99)
Potassium: 3.6 mmol/L (ref 3.5–5.1)
Sodium: 138 mmol/L (ref 135–145)
Total Bilirubin: 0.9 mg/dL (ref 0.3–1.2)
Total Protein: 7.3 g/dL (ref 6.5–8.1)

## 2018-11-05 LAB — URINALYSIS, ROUTINE W REFLEX MICROSCOPIC
Bilirubin Urine: NEGATIVE
Glucose, UA: NEGATIVE mg/dL
Hgb urine dipstick: NEGATIVE
Ketones, ur: 5 mg/dL — AB
Leukocytes,Ua: NEGATIVE
Nitrite: NEGATIVE
Protein, ur: NEGATIVE mg/dL
Specific Gravity, Urine: 1.015 (ref 1.005–1.030)
pH: 5 (ref 5.0–8.0)

## 2018-11-05 LAB — CBC
HCT: 50.7 % (ref 39.0–52.0)
Hemoglobin: 16.8 g/dL (ref 13.0–17.0)
MCH: 31.4 pg (ref 26.0–34.0)
MCHC: 33.1 g/dL (ref 30.0–36.0)
MCV: 94.8 fL (ref 80.0–100.0)
Platelets: 374 10*3/uL (ref 150–400)
RBC: 5.35 MIL/uL (ref 4.22–5.81)
RDW: 17 % — ABNORMAL HIGH (ref 11.5–15.5)
WBC: 18.3 10*3/uL — ABNORMAL HIGH (ref 4.0–10.5)
nRBC: 0 % (ref 0.0–0.2)

## 2018-11-05 LAB — SARS CORONAVIRUS 2 BY RT PCR (HOSPITAL ORDER, PERFORMED IN ~~LOC~~ HOSPITAL LAB): SARS Coronavirus 2: NEGATIVE

## 2018-11-05 LAB — LACTIC ACID, PLASMA: Lactic Acid, Venous: 3 mmol/L (ref 0.5–1.9)

## 2018-11-05 LAB — LIPASE, BLOOD: Lipase: 166 U/L — ABNORMAL HIGH (ref 11–51)

## 2018-11-05 LAB — TROPONIN I: Troponin I: <0.03 ng/mL (ref <0.03)

## 2018-11-05 MED ORDER — SODIUM CHLORIDE 0.9 % IV BOLUS
1000.0000 mL | Freq: Once | INTRAVENOUS | Status: AC
  Administered 2018-11-05: 1000 mL via INTRAVENOUS

## 2018-11-05 MED ORDER — BUDESONIDE 0.5 MG/2ML IN SUSP
0.5000 mg | Freq: Two times a day (BID) | RESPIRATORY_TRACT | Status: DC
  Administered 2018-11-06 – 2018-11-07 (×3): 0.5 mg via RESPIRATORY_TRACT
  Filled 2018-11-05 (×3): qty 2

## 2018-11-05 MED ORDER — LORAZEPAM 2 MG/ML IJ SOLN
0.5000 mg | Freq: Once | INTRAMUSCULAR | Status: AC | PRN
  Administered 2018-11-05: 21:00:00 0.5 mg via INTRAVENOUS
  Filled 2018-11-05: qty 1

## 2018-11-05 MED ORDER — POSACONAZOLE 100 MG PO TBEC
300.0000 mg | DELAYED_RELEASE_TABLET | Freq: Every day | ORAL | Status: DC
  Administered 2018-11-06 – 2018-11-07 (×2): 300 mg via ORAL
  Filled 2018-11-05 (×2): qty 3

## 2018-11-05 MED ORDER — ACETAMINOPHEN 650 MG RE SUPP: 650.0000 mg | Freq: Four times a day (QID) | RECTAL | Status: DC | PRN

## 2018-11-05 MED ORDER — AZITHROMYCIN 250 MG PO TABS
250.0000 mg | ORAL_TABLET | ORAL | Status: DC
  Administered 2018-11-07: 250 mg via ORAL
  Filled 2018-11-05: qty 1

## 2018-11-05 MED ORDER — ENOXAPARIN SODIUM 40 MG/0.4ML ~~LOC~~ SOLN
40.0000 mg | Freq: Every day | SUBCUTANEOUS | Status: DC
  Administered 2018-11-06 – 2018-11-07 (×2): 40 mg via SUBCUTANEOUS
  Filled 2018-11-05 (×2): qty 0.4

## 2018-11-05 MED ORDER — FLUTICASONE PROPIONATE 50 MCG/ACT NA SUSP: 2.0000 | Freq: Every day | NASAL | Status: DC | PRN

## 2018-11-05 MED ORDER — LORATADINE 10 MG PO TABS
10.0000 mg | ORAL_TABLET | Freq: Every day | ORAL | Status: DC
  Administered 2018-11-06 – 2018-11-07 (×2): 10 mg via ORAL
  Filled 2018-11-05 (×2): qty 1

## 2018-11-05 MED ORDER — PROCHLORPERAZINE EDISYLATE 10 MG/2ML IJ SOLN: 10.0000 mg | Freq: Once | INTRAMUSCULAR | Status: DC

## 2018-11-05 MED ORDER — ONDANSETRON HCL 4 MG/2ML IJ SOLN: 4.0000 mg | Freq: Four times a day (QID) | INTRAMUSCULAR | Status: DC | PRN

## 2018-11-05 MED ORDER — ACETAMINOPHEN 325 MG PO TABS: 650.0000 mg | ORAL_TABLET | Freq: Four times a day (QID) | ORAL | Status: DC | PRN

## 2018-11-05 MED ORDER — HYDROCORTISONE NA SUCCINATE PF 100 MG IJ SOLR: 50.0000 mg | Freq: Three times a day (TID) | INTRAMUSCULAR | Status: DC

## 2018-11-05 MED ORDER — SODIUM CHLORIDE 0.9 % IV SOLN
INTRAVENOUS | Status: DC
  Administered 2018-11-06 (×2): via INTRAVENOUS

## 2018-11-05 MED ORDER — MONTELUKAST SODIUM 10 MG PO TABS
10.0000 mg | ORAL_TABLET | Freq: Every day | ORAL | Status: DC
  Administered 2018-11-06 – 2018-11-07 (×2): 10 mg via ORAL
  Filled 2018-11-05 (×2): qty 1

## 2018-11-05 MED ORDER — ALBUTEROL SULFATE (2.5 MG/3ML) 0.083% IN NEBU: 3.0000 mL | INHALATION_SOLUTION | Freq: Four times a day (QID) | RESPIRATORY_TRACT | Status: DC | PRN

## 2018-11-05 MED ORDER — ONDANSETRON HCL 4 MG PO TABS: 4.0000 mg | ORAL_TABLET | Freq: Four times a day (QID) | ORAL | Status: DC | PRN

## 2018-11-05 MED ORDER — ASPIRIN EC 81 MG PO TBEC
81.0000 mg | DELAYED_RELEASE_TABLET | Freq: Every day | ORAL | Status: DC
  Administered 2018-11-06 (×2): 81 mg via ORAL
  Filled 2018-11-05 (×2): qty 1

## 2018-11-05 MED ORDER — HYDROCORTISONE NA SUCCINATE PF 100 MG IJ SOLR
50.0000 mg | Freq: Three times a day (TID) | INTRAMUSCULAR | Status: DC
  Administered 2018-11-06: 50 mg via INTRAVENOUS
  Filled 2018-11-05: qty 2

## 2018-11-05 MED ORDER — HYDROCORTISONE NA SUCCINATE PF 100 MG IJ SOLR
50.0000 mg | Freq: Once | INTRAMUSCULAR | Status: AC
  Administered 2018-11-05: 22:00:00 50 mg via INTRAVENOUS
  Filled 2018-11-05: qty 2

## 2018-11-05 MED ORDER — PROCHLORPERAZINE EDISYLATE 10 MG/2ML IJ SOLN: 5.0000 mg | Freq: Once | INTRAMUSCULAR | Status: DC

## 2018-11-05 MED ORDER — PANTOPRAZOLE SODIUM 40 MG PO TBEC
80.0000 mg | DELAYED_RELEASE_TABLET | Freq: Every day | ORAL | Status: DC
  Administered 2018-11-06 – 2018-11-07 (×2): 80 mg via ORAL
  Filled 2018-11-05 (×2): qty 2

## 2018-11-05 MED ORDER — VITAMIN D 25 MCG (1000 UNIT) PO TABS
2000.0000 [IU] | ORAL_TABLET | Freq: Every day | ORAL | Status: DC
  Administered 2018-11-06 (×2): 2000 [IU] via ORAL
  Filled 2018-11-05 (×2): qty 2

## 2018-11-05 MED ORDER — ACYCLOVIR 400 MG PO TABS
400.0000 mg | ORAL_TABLET | Freq: Two times a day (BID) | ORAL | Status: DC
  Administered 2018-11-06 – 2018-11-07 (×4): 400 mg via ORAL
  Filled 2018-11-05 (×4): qty 1

## 2018-11-05 MED ORDER — ONDANSETRON HCL 4 MG/2ML IJ SOLN
4.0000 mg | Freq: Once | INTRAMUSCULAR | Status: AC
  Administered 2018-11-05: 4 mg via INTRAVENOUS
  Filled 2018-11-05: qty 2

## 2018-11-05 NOTE — ED Triage Notes (Signed)
Pt from home with c/o nausea, emesis, SOB, and reports he may be having an adrenal crisis. PT reports he was on a prednisone taper and believes he may have come off them too fast. Pt was told to take some hydrocortisone and come to the ER. PT reports multiple episodes of emesis.

## 2018-11-05 NOTE — ED Notes (Signed)
X-ray at bedside

## 2018-11-05 NOTE — H&P (Signed)
History and Physical    Brandon [name] IRC:[id number] DOB: 1950-10-06 DOA: 11/05/2018  PCP: [name], MD  Patient coming from: Home  I have personally briefly reviewed patient's old medical records in [place]  Chief Complaint: N/V  HPI: Brandon [name] is a 68 y.o. male with medical history significant of MDS s/p bone marrow transplant, chronic steroids for GVHD, resultant Adrenal insufficiency with episodes of adrenal crisis requiring hospitalization in past.  Jan 2019 for example had 2 admits with no source of infection found.  Most recently he was admitted 2 weeks ago for adrenal crisis in setting of CAP.  CAP treated with ABx and patient discharged on increased steroid dose.  Cough and PNA symptoms resolved.  Steroids being tapered off.  Today took Cortisol 20mg  in AM and 10mg  at noon.  This evening began to develop abrupt onset of severe N/V, similar to prior adrenal insufficiency episodes in past.  He wonders if hydrocortisone was tapered too quickly.  Tried taking 80mg  cortisone at home at instruction of doctor but vomited after this so isnt sure how much (if any) actually stayed down.  No fever, no cough.   ED Course: CT chest, abd, pelvis neg for acute finding.  Prior PNA appears to be resolving.  BPs noted to be down trending while in ED, got into the 51W systolic.  Nausea meds, 50 solucortef IV in ED, patient says symptoms are improved.  BP now up to 258 systolic a few mins after solucortef administration.   Review of Systems: As per HPI otherwise 10 point review of systems negative.   Past Medical History:  Diagnosis Date   Adrenocortical insufficiency ([place]) 08/06/2017   Patient with 2 hospitalizations in January 2019- treated with fluids, antibiotics, steroids- ultimately thought most likely due to adrenal insufficiency with no obvious source of infection found   Allergy    seasonal/animals   Chronic prostatitis 12/17/2008   Diverticulosis  01/16/2011   History of diverticulitis    ERECTILE DYSFUNCTION 02/07/2007   HYPERLIPIDEMIA 02/04/2007   HYPERTENSION 02/04/2007   NEOPLASM, MALIGNANT, SKIN, BACK 02/09/2009   Pneumonia, viral 12/2017   S/P bone marrow transplant [facility])     Past Surgical History:  Procedure Laterality Date   CATARACT EXTRACTION Left 06/2018   none       reports that he has never smoked. He has never used smokeless tobacco. He reports current alcohol use of about 3.0 standard drinks of alcohol per week. He reports that he does not use drugs.  No Known Allergies  Family History  Problem Relation Age of Onset   Breast cancer Mother    CVA Mother        quit taking HTN meds   Alzheimer's disease Father        late 89s. mid 70s   Asperger's syndrome Son      Prior to Admission medications   Medication Sig Start Date End Date Taking? Authorizing Provider  acyclovir (ZOVIRAX) 400 MG tablet Take 400 mg by mouth 2 (two) times daily.   Yes [provider]  albuterol (PROVENTIL HFA;VENTOLIN HFA) 108 (90 Base) MCG/ACT inhaler Inhale 1-2 puffs into the lungs every 6 (six) hours as needed for wheezing or shortness of breath.    Yes [provider]  amLODipine (NORVASC) 2.5 MG tablet Take 1 tablet (2.5 mg total) by mouth daily. 02/21/18  Yes [name], MD  aspirin EC 81 MG tablet Take 81 mg by mouth at bedtime.  Yes [provider]  atenolol (TENORMIN) 50 MG tablet Take 0.5 tablets (25 mg total) by mouth every evening. Patient taking differently: Take 50 mg by mouth every evening.  07/21/17  Yes Oretha Milch D, MD  azithromycin (ZITHROMAX) 250 MG tablet Take 250 mg by mouth every Monday, Wednesday, and Friday.  10/30/18  Yes [provider]  Cholecalciferol (VITAMIN D) 50 MCG (2000 UT) CAPS Take 2,000 Units by mouth at bedtime.    Yes [provider]  fluticasone (FLONASE) 50 MCG/ACT nasal spray Place 2 sprays into both nostrils daily as needed for  allergies or rhinitis.   Yes [provider]  fluticasone (FLOVENT HFA) 220 MCG/ACT inhaler Inhale 2 puffs into the lungs 2 (two) times daily.    Yes [provider]  hydrocortisone (CORTEF) 20 MG tablet Take 2 tablets (40 mg total) by mouth 2 (two) times daily for 14 days. 10/25/18 11/08/18 Yes Oretha Milch D, MD  loratadine (CLARITIN) 10 MG tablet Take 10 mg by mouth daily.   Yes [provider]  Magnesium Oxide 400 MG CAPS Take 1 capsule (400 mg total) by mouth 5 (five) times daily. Patient taking differently: Take 400 mg by mouth daily.  09/03/16  Yes Nicholas Lose, MD  montelukast (SINGULAIR) 10 MG tablet Take 10 mg by mouth daily.    Yes [provider]  omeprazole (PRILOSEC) 40 MG capsule Take 40 mg by mouth 2 (two) times daily. 08/25/18  Yes [provider]  ondansetron (ZOFRAN-ODT) 8 MG disintegrating tablet Take 8 mg by mouth every 8 (eight) hours as needed for nausea or vomiting.   Yes [provider]  polyvinyl alcohol (LIQUIFILM TEARS) 1.4 % ophthalmic solution Place 1 drop into both eyes as needed for dry eyes.   Yes [provider]  posaconazole (NOXAFIL) 100 MG TBEC delayed-release tablet Take 300 mg by mouth daily. 06/03/16  Yes [provider]  senna-docusate (SENOKOT-S) 8.6-50 MG tablet Take 2 tablets by mouth at bedtime.    Yes [provider]  Lancets (ONETOUCH DELICA PLUS [id number]) [place]  07/14/18   [provider]    Physical Exam: Vitals:   11/05/18 1930 11/05/18 2000 11/05/18 2130 11/05/18 2200  BP: (!) 164/96 (!) 172/93 114/75 99/65  Pulse: 84 89 89 79  Resp: 19 17 (!) 24 (!) 25  Temp:      TempSrc:      SpO2: 95% 96% 93% 92%    Constitutional: NAD, calm, comfortable Eyes: PERRL, lids and conjunctivae normal ENMT: Mucous membranes are moist. Posterior pharynx clear of any exudate or lesions.Normal dentition.  Neck: normal, supple, no masses, no thyromegaly Respiratory: clear to  auscultation bilaterally, no wheezing, no crackles. Normal respiratory effort. No accessory muscle use.  Cardiovascular: Regular rate and rhythm, no murmurs / rubs / gallops. No extremity edema. 2+ pedal pulses. No carotid bruits.  Abdomen: no tenderness, no masses palpated. No hepatosplenomegaly. Bowel sounds positive.  Musculoskeletal: no clubbing / cyanosis. No joint deformity upper and lower extremities. Good ROM, no contractures. Normal muscle tone.  Skin: no rashes, lesions, ulcers. No induration Neurologic: CN 2-12 grossly intact. Sensation intact, DTR normal. Strength 5/5 in all 4.  Psychiatric: Normal judgment and insight. Alert and oriented x 3. Normal mood.    Labs on Admission: I have personally reviewed following labs and imaging studies  CBC: Recent Labs  Lab 11/05/18 1904  WBC 18.3*  HGB 16.8  HCT 50.7  MCV 94.8  PLT 374   Basic  Metabolic Panel: Recent Labs  Lab 11/05/18 1904  NA 138  K 3.6  CL 104  CO2 21*  GLUCOSE 115*  BUN 18  CREATININE 1.32*  CALCIUM 9.3   GFR: Estimated Creatinine Clearance: 63.9 mL/min (A) (by C-G formula based on SCr of 1.32 mg/dL (H)). Liver Function Tests: Recent Labs  Lab 11/05/18 1904  AST 42*  ALT 39  ALKPHOS 122  BILITOT 0.9  PROT 7.3  ALBUMIN 4.1   Recent Labs  Lab 11/05/18 1904  LIPASE 166*   No results for input(s): AMMONIA in the last 168 hours. Coagulation Profile: No results for input(s): INR, PROTIME in the last 168 hours. Cardiac Enzymes: Recent Labs  Lab 11/05/18 1904  TROPONINI <0.03   BNP (last 3 results) No results for input(s): PROBNP in the last 8760 hours. HbA1C: No results for input(s): HGBA1C in the last 72 hours. CBG: No results for input(s): GLUCAP in the last 168 hours. Lipid Profile: No results for input(s): CHOL, HDL, LDLCALC, TRIG, CHOLHDL, LDLDIRECT in the last 72 hours. Thyroid Function Tests: No results for input(s): TSH, T4TOTAL, FREET4, T3FREE, THYROIDAB in the last 72  hours. Anemia Panel: No results for input(s): VITAMINB12, FOLATE, FERRITIN, TIBC, IRON, RETICCTPCT in the last 72 hours. Urine analysis:    Component Value Date/Time   COLORURINE YELLOW 11/05/2018 1904   APPEARANCEUR CLEAR 11/05/2018 1904   LABSPEC 1.015 11/05/2018 1904   PHURINE 5.0 11/05/2018 1904   GLUCOSEU NEGATIVE 11/05/2018 1904   GLUCOSEU NEGATIVE 12/23/2009 0742   HGBUR NEGATIVE 11/05/2018 1904   HGBUR negative 01/24/2007 0810   BILIRUBINUR NEGATIVE 11/05/2018 1904   BILIRUBINUR negative 08/10/2014 1057   BILIRUBINUR n 02/11/2013 1059   KETONESUR 5 (A) 11/05/2018 1904   PROTEINUR NEGATIVE 11/05/2018 1904   UROBILINOGEN 0.2 08/10/2014 1057   UROBILINOGEN 0.2 12/23/2009 0742   NITRITE NEGATIVE 11/05/2018 1904   LEUKOCYTESUR NEGATIVE 11/05/2018 1904    Radiological Exams on Admission: Ct Abdomen Pelvis Wo Contrast  Result Date: 11/05/2018 CLINICAL DATA:  Nausea and vomiting. Shortness of breath. Possible adrenal crisis. EXAM: CT CHEST, ABDOMEN AND PELVIS WITHOUT CONTRAST TECHNIQUE: Multidetector CT imaging of the chest, abdomen and pelvis was performed following the standard protocol without IV contrast. COMPARISON:  10/21/2018 FINDINGS: CT CHEST FINDINGS Cardiovascular: Heart size is normal. No pericardial fluid. Minimal coronary artery calcification. Mild aortic atherosclerosis. Mediastinum/Nodes: No mass or adenopathy. Lungs/Pleura: Improving patchy infiltrate in the posterior left upper lobe. There is not yet complete clearing, but the trend is favorable. Atelectasis of the medial left lower lobe adjacent to the tortuous aorta is unchanged. Few other areas pulmonary scarring are stable. Evidence of pulmonary edema. No new infiltrate. No pleural effusion. Musculoskeletal: Ordinary spondylosis. CT ABDOMEN PELVIS FINDINGS Hepatobiliary: Liver appears normal without contrast. No calcified gallstones. Pancreas: Normal Spleen: Normal Adrenals/Urinary Tract: Adrenal glands are normal.  Kidneys are normal. No evidence of stone disease. No bladder abnormality. Stomach/Bowel: Evidence of ileus or obstruction. Sigmoid diverticulosis without evidence of diverticulitis presently. Vascular/Lymphatic: Aortic atherosclerotic calcification, mild. No aneurysm. IVC is normal. No retroperitoneal adenopathy. Lymphadenopathy at the gastrohepatic ligament is again seen is stable over time. Reproductive: Normal Other: No free fluid or air. Musculoskeletal: Small left inguinal hernia containing only fat. Ordinary degenerative changes affect the spine, including degenerative anterolisthesis at L4-5. IMPRESSION: No acute finding by CT. Subtotal resolution the patchy abnormality previously seen in the posterior left upper lobe, consistent with resolving pneumonia. Similar appearance chronic atelectasis of the medial left lower lobe adjacent to the aorta.  No acute or significant abdominal or pelvic finding. Aortic atherosclerosis. Sigmoid diverticulosis. Chronic spinal degenerative changes including degenerative anterolisthesis L4-5. Electronically Signed   By: Nelson Chimes M.D.   On: 11/05/2018 21:36   Ct Chest Wo Contrast  Result Date: 11/05/2018 CLINICAL DATA:  Nausea and vomiting. Shortness of breath. Possible adrenal crisis. EXAM: CT CHEST, ABDOMEN AND PELVIS WITHOUT CONTRAST TECHNIQUE: Multidetector CT imaging of the chest, abdomen and pelvis was performed following the standard protocol without IV contrast. COMPARISON:  10/21/2018 FINDINGS: CT CHEST FINDINGS Cardiovascular: Heart size is normal. No pericardial fluid. Minimal coronary artery calcification. Mild aortic atherosclerosis. Mediastinum/Nodes: No mass or adenopathy. Lungs/Pleura: Improving patchy infiltrate in the posterior left upper lobe. There is not yet complete clearing, but the trend is favorable. Atelectasis of the medial left lower lobe adjacent to the tortuous aorta is unchanged. Few other areas pulmonary scarring are stable. Evidence of  pulmonary edema. No new infiltrate. No pleural effusion. Musculoskeletal: Ordinary spondylosis. CT ABDOMEN PELVIS FINDINGS Hepatobiliary: Liver appears normal without contrast. No calcified gallstones. Pancreas: Normal Spleen: Normal Adrenals/Urinary Tract: Adrenal glands are normal. Kidneys are normal. No evidence of stone disease. No bladder abnormality. Stomach/Bowel: Evidence of ileus or obstruction. Sigmoid diverticulosis without evidence of diverticulitis presently. Vascular/Lymphatic: Aortic atherosclerotic calcification, mild. No aneurysm. IVC is normal. No retroperitoneal adenopathy. Lymphadenopathy at the gastrohepatic ligament is again seen is stable over time. Reproductive: Normal Other: No free fluid or air. Musculoskeletal: Small left inguinal hernia containing only fat. Ordinary degenerative changes affect the spine, including degenerative anterolisthesis at L4-5. IMPRESSION: No acute finding by CT. Subtotal resolution the patchy abnormality previously seen in the posterior left upper lobe, consistent with resolving pneumonia. Similar appearance chronic atelectasis of the medial left lower lobe adjacent to the aorta. No acute or significant abdominal or pelvic finding. Aortic atherosclerosis. Sigmoid diverticulosis. Chronic spinal degenerative changes including degenerative anterolisthesis L4-5. Electronically Signed   By: Nelson Chimes M.D.   On: 11/05/2018 21:36   Dg Chest Port 1 View  Result Date: 11/05/2018 CLINICAL DATA:  Shortness of breath EXAM: PORTABLE CHEST 1 VIEW COMPARISON:  10/21/2018 FINDINGS: The heart size and mediastinal contours are within normal limits. Both lungs are clear. The visualized skeletal structures are unremarkable. IMPRESSION: No active disease. Electronically Signed   By: Ulyses Jarred M.D.   On: 11/05/2018 19:21    EKG: Independently reviewed.  Assessment/Plan Principal Problem:   Adrenal crisis [facility]) Active Problems:   Essential hypertension   History of  stem cell transplant ([place])   GVHD (graft versus host disease) ([place])   Adrenocortical insufficiency ([place])    1. Adrenal crisis - 1. Symptoms "improved" per patient after 50mg  solucortef, nausea meds, and IVF started. 2. BP seems to be responsive as well (was down trending into 90s, but now up to 323 systolic just a few mins after solucortef) 3. Ordering 50mg  solu cortef IV Q8H, (first dose now to make 100mg  initial dose) 4. IVF: 2L NS bolus then 125 cc/hr 5. Repeat CBC/BMP in AM 6. No obvious source of infection at this point 1. UA neg 2. COVID neg 3. CT chest, abd, pelvis neg 4. BCx pending 5. No fever 6. WBC 18k, no other SIRS 7. Had single episode of "loose stool" while here, but no diarrhea PTA 8. Dont really have a strong reason to reach for empiric ABx if he continues to respond rapidly and positively to stress dose steroids. 7. Trend lactate 2. HTN - 1. Holding home BP meds due to down  trending BP in ED 3. H/o Bone marrow transplant with GVHD - 1. Stress dose steroids as above 2. Continue home azithromycin, acyclovir, posaconazole  DVT prophylaxis: Lovenox Code Status: Full Family Communication: No family in room Disposition Plan: Home after admit Consults called: None Admission status: Place in 69    Sarita Hakanson, [employer] Hospitalists  How to contact the [facility] Attending or Consulting provider [place] or covering provider during after hours [place], for this patient?  1. Check the care team in [facility] and look for a) attending/consulting [facility] provider listed and b) the [facility] team listed 2. Log into [email or web address]  Amion Physician Scheduling and messaging for groups and whole hospitals  On call and physician scheduling software for group practices, residents, hospitalists and other medical providers for call, clinic, rotation and shift schedules. OnCall [employer] is a hospital-wide system for scheduling doctors and paging doctors on call. EasyPlot is for scientific  plotting and data analysis.  [email or web address]  and use [employer]'s universal password to access. If you do not have the password, please contact the hospital operator.  3. Locate the [facility] provider you are looking for under [facility] and page to a number that you can be directly reached. 4. If you still have difficulty reaching the provider, please page the [facility] (Director on Call) for the Hospitalists listed on amion for assistance.  11/05/2018, 10:40 PM

## 2018-11-05 NOTE — ED Provider Notes (Signed)
[facility] [place] Emergency Department Provider Note MRN:  [id number]  Arrival date & time: 11/06/18     Chief Complaint   Nausea; Emesis; and Shortness of Breath   History of Present Illness   Brandon [name] is a 68 y.o. year-old male with a history of adrenal insufficiency, myelodysplastic syndrome status post stem cell transplant complicated by graft-versus-host disease presenting to the ED with chief complaint of emesis.  Patient explains that his current presentation is similar to many in the past, consistent with adrenal crisis.  Today began feeling generally unwell with nausea, persistent nonbloody nonbilious emesis, mild diarrhea, accompanied by shortness of breath related to the vomiting.  Denies chest pain, denies abdominal pain.  Denies headache or vision change, no numbness weakness to the arms or legs.  Had a similar presentation 1 month ago, was admitted for pneumonia and sepsis.  Denies fever, denies cough.  Symptoms are moderate, constant, no exacerbating relieving factors.  Explains that he is on a taper of his hydrocortisone medication and maybe he is being tapered too quickly.  Review of Systems  A complete 10 system review of systems was obtained and all systems are negative except as noted in the HPI and PMH.   Patient's Health History    Past Medical History:  Diagnosis Date  . Adrenocortical insufficiency ([place]) 08/06/2017   Patient with 2 hospitalizations in January 2019- treated with fluids, antibiotics, steroids- ultimately thought most likely due to adrenal insufficiency with no obvious source of infection found  . Allergy    seasonal/animals  . Chronic prostatitis 12/17/2008  . Diverticulosis 01/16/2011   History of diverticulitis   . ERECTILE DYSFUNCTION 02/07/2007  . HYPERLIPIDEMIA 02/04/2007  . HYPERTENSION 02/04/2007  . NEOPLASM, MALIGNANT, SKIN, BACK 02/09/2009  . Pneumonia, viral 12/2017  . S/P bone marrow transplant [facility])     Past Surgical  History:  Procedure Laterality Date  . CATARACT EXTRACTION Left 06/2018  . none      Family History  Problem Relation Age of Onset  . Breast cancer Mother   . CVA Mother        quit taking HTN meds  . Alzheimer's disease Father        late 5s. mid 61s  . Asperger's syndrome Son     Social History   Socioeconomic History  . Marital status: Married    Spouse name: Not on file  . Number of children: Not on file  . Years of education: Not on file  . Highest education level: Not on file  Occupational History  . Not on file  Social Needs  . Financial resource strain: Not on file  . Food insecurity:    Worry: Not on file    Inability: Not on file  . Transportation needs:    Medical: Not on file    Non-medical: Not on file  Tobacco Use  . Smoking status: Never Smoker  . Smokeless tobacco: Never Used  Substance and Sexual Activity  . Alcohol use: Yes    Alcohol/week: 3.0 standard drinks    Types: 3 Standard drinks or equivalent per week  . Drug use: No  . Sexual activity: Yes  Lifestyle  . Physical activity:    Days per week: Not on file    Minutes per session: Not on file  . Stress: Not on file  Relationships  . Social connections:    Talks on phone: Not on file    Gets together: Not on file  Attends religious service: Not on file    Active member of club or organization: Not on file    Attends meetings of clubs or organizations: Not on file    Relationship status: Not on file  . Intimate partner violence:    Fear of current or ex partner: Not on file    Emotionally abused: Not on file    Physically abused: Not on file    Forced sexual activity: Not on file  Other Topics Concern  . Not on file  Social History Narrative   Married 1973. Wife has Parkinsons. 2 daughters, 1 son (middle). 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, primarily Engineer, production      Hobbies: hiking, Marketing executive, photography     Physical Exam   Vital Signs and Nursing Notes reviewed Vitals:   11/05/18 2300 11/05/18 2350  BP: 107/65 120/69  Pulse: 73 75  Resp: (!) 21 16  Temp:  99.2 F (37.3 C)  SpO2: 93% 97%    CONSTITUTIONAL: Well-appearing, actively vomiting NEURO:  Alert and oriented x 3, no focal deficits EYES:  eyes equal and reactive ENT/NECK:  no LAD, no JVD CARDIO: Regular rate, well-perfused, normal S1 and S2 PULM:  CTAB no wheezing or rhonchi GI/GU:  normal bowel sounds, non-distended, non-tender MSK/SPINE:  No gross deformities, no edema SKIN:  no rash, atraumatic PSYCH:  Appropriate speech and behavior  Diagnostic and Interventional Summary    EKG Interpretation  Date/Time:  Wednesday Nov 05 2018 18:40:48 EDT Ventricular Rate:  79 PR Interval:    QRS Duration: 69 QT Interval:  477 QTC Calculation: 526 R Axis:   88 Text Interpretation:  Sinus rhythm Atrial premature complexes Probable left atrial enlargement Borderline right axis deviation Low voltage, precordial leads Borderline repolarization abnormality Prolonged QT interval Confirmed by Gerlene Fee [phone]) on 11/05/2018 7:21:43 PM      Labs Reviewed  URINALYSIS, ROUTINE W REFLEX MICROSCOPIC - Abnormal; Notable for the following components:      Result Value   Ketones, ur 5 (*)    All other components within normal limits  LACTIC ACID, PLASMA - Abnormal; Notable for the following components:   Lactic Acid, Venous 3.0 (*)    All other components within normal limits  CBC - Abnormal; Notable for the following components:   WBC 18.3 (*)    RDW 17.0 (*)    All other components within normal limits  COMPREHENSIVE METABOLIC PANEL - Abnormal; Notable for the following components:   CO2 21 (*)    Glucose, Bld 115 (*)    Creatinine, Ser 1.32 (*)    AST 42 (*)    GFR calc non Af Amer 55 (*)    All other components within normal limits  LIPASE, BLOOD - Abnormal; Notable for the following components:   Lipase 166 (*)    All other components  within normal limits  SARS CORONAVIRUS 2 (HOSPITAL ORDER, [place] [facility])  CULTURE, BLOOD (ROUTINE X 2)  CULTURE, BLOOD (ROUTINE X 2)  TROPONIN I  CBC  BASIC METABOLIC PANEL    CT Chest Wo Contrast  Final Result    CT ABDOMEN PELVIS WO CONTRAST  Final Result    DG Chest Port 1 View  Final Result      Medications  acyclovir (ZOVIRAX) tablet 400 mg (has no administration in time range)  albuterol (PROVENTIL) (2.5 MG/3ML) 0.083% nebulizer solution 3 mL (has no administration in  time range)  aspirin EC tablet 81 mg (has no administration in time range)  azithromycin (ZITHROMAX) tablet 250 mg (has no administration in time range)  cholecalciferol (VITAMIN D3) tablet 2,000 Units (has no administration in time range)  fluticasone (FLONASE) 50 MCG/ACT nasal spray 2 spray (has no administration in time range)  budesonide (PULMICORT) nebulizer solution 0.5 mg (2 puffs Inhalation Not Given 11/05/18 2317)  loratadine (CLARITIN) tablet 10 mg (has no administration in time range)  montelukast (SINGULAIR) tablet 10 mg (has no administration in time range)  pantoprazole (PROTONIX) EC tablet 80 mg (has no administration in time range)  posaconazole (NOXAFIL) delayed-release tablet 300 mg (has no administration in time range)  acetaminophen (TYLENOL) tablet 650 mg (has no administration in time range)    Or  acetaminophen (TYLENOL) suppository 650 mg (has no administration in time range)  ondansetron (ZOFRAN) tablet 4 mg (has no administration in time range)    Or  ondansetron (ZOFRAN) injection 4 mg (has no administration in time range)  enoxaparin (LOVENOX) injection 40 mg (has no administration in time range)  0.9 %  sodium chloride infusion ( Intravenous New Bag/Given 11/06/18 0007)  hydrocortisone sodium succinate (SOLU-CORTEF) 100 MG injection 50 mg (has no administration in time range)  ondansetron (ZOFRAN) injection 4 mg (4 mg Intravenous Given 11/05/18 1850)  sodium  chloride 0.9 % bolus 1,000 mL (0 mLs Intravenous Stopped 11/05/18 2128)  LORazepam (ATIVAN) injection 0.5 mg (0.5 mg Intravenous Given 11/05/18 2040)  hydrocortisone sodium succinate (SOLU-CORTEF) 100 MG injection 50 mg (50 mg Intravenous Given 11/05/18 2220)  sodium chloride 0.9 % bolus 1,000 mL (1,000 mLs Intravenous New Bag/Given 11/05/18 2219)     Procedures Critical Care Critical Care Documentation Critical care time provided by me (excluding procedures): 32 minutes  Condition necessitating critical care: Adrenal crisis, hypotension  Components of critical care management: reviewing of prior records, laboratory and imaging interpretation, frequent re-examination and reassessment of vital signs, administration of IV hydrocortisone, discussion with consulting services    ED Course and Medical Decision Making  I have reviewed the triage vital signs and the nursing notes.  Pertinent labs & imaging results that were available during my care of the patient were reviewed by me and considered in my medical decision making (see below for details).  Considering recurrent sepsis or pneumonia, adrenal crisis in this 68 year old male with history of same.  Patient is currently with normal heart rate and blood pressure.  Respirations are increased likely due to persistent nausea and vomiting.  Will attempt symptom control and reassess.  Abdomen is soft and nontender, currently without clear indication for abdominal imaging but will continue to consider.  Clinical Course as of Nov 06 19  Wed Nov 05, 2018  2031 EKG reveals prolonged QT, will refrain from Zofran or other QT prolonging agents.  Labs reveal mild AKI, lactate of 3, mildly elevated lipase, leukocytosis of 18.  The leukocytosis could be explained by patient's recent large steroid dosing today in response to possible adrenal crisis, however given his recent sepsis and other laboratory findings will move forward with CT imaging of the chest and  abdomen as was performed during his presentation 1 month ago.   [MB]    Clinical Course User Index [MB] Maudie Flakes, MD    CTs are largely unremarkable, pneumonia improving.  Pressures downtrending, on my assessment 38B systolic.  It is my suspicion that the 80 mg hydrocortisone that he took shortly prior to arrival were lost due to  vomiting.  Provided with IV hydrocortisone, admitted to hospital service for further care.  Barth Kirks. Sedonia Small, [place] [email or web address]  Final Clinical Impressions(s) / ED Diagnoses     ICD-10-CM   1. Adrenal crisis ([place]) E27.2   2. Emesis R11.10 DG Chest [facility] 1 View    DG Chest [place] 1 View    ED Discharge Orders    None         Maudie Flakes, MD 11/06/18 [phone]

## 2018-11-05 NOTE — ED Notes (Signed)
ED TO INPATIENT HANDOFF REPORT  ED Nurse Name and Phone #: Nila Nephew [phone]  S Name/Age/Gender Elisabeth Most 68 y.o. male Room/Bed: WA22/WA22  Code Status   Code Status: Full Code  Home/SNF/Other Home Patient oriented to: self, place, time and situation Is this baseline? Yes   Triage Complete: Triage complete  Chief Complaint Adrenal crisis?  Triage Note Pt from home with c/o nausea, emesis, SOB, and reports he may be having an adrenal crisis. PT reports he was on a prednisone taper and believes he may have come off them too fast. Pt was told to take some hydrocortisone and come to the ER. PT reports multiple episodes of emesis.    Allergies No Known Allergies  Level of Care/Admitting Diagnosis ED Disposition    ED Disposition Condition Comment   Admit  Hospital Area: [facility] [[id number]]  Level of Care: Telemetry [5]  Admit to tele based on following criteria: Other see comments  Comments: suspected adrenal crisis  Covid Evaluation: Screening Protocol (No Symptoms)  Diagnosis: Adrenal crisis [facility]) [[id number]]  Admitting Physician: Etta Quill [phone]  Attending Physician: Etta Quill [[id number]]  PT Class (Do Not Modify): Observation [104]  PT Acc Code (Do Not Modify): Observation [10022]       B Medical/Surgery History Past Medical History:  Diagnosis Date  . Adrenocortical insufficiency ([place]) 08/06/2017   Patient with 2 hospitalizations in January 2019- treated with fluids, antibiotics, steroids- ultimately thought most likely due to adrenal insufficiency with no obvious source of infection found  . Allergy    seasonal/animals  . Chronic prostatitis 12/17/2008  . Diverticulosis 01/16/2011   History of diverticulitis   . ERECTILE DYSFUNCTION 02/07/2007  . HYPERLIPIDEMIA 02/04/2007  . HYPERTENSION 02/04/2007  . NEOPLASM, MALIGNANT, SKIN, BACK 02/09/2009  . Pneumonia, viral 12/2017  . S/P bone marrow transplant [facility])    Past Surgical  History:  Procedure Laterality Date  . CATARACT EXTRACTION Left 06/2018  . none       A IV Location/Drains/Wounds Patient Lines/Drains/Airways Status   Active Line/Drains/Airways    Name:   Placement date:   Placement time:   Site:   Days:   Peripheral IV 11/05/18 Right Antecubital   11/05/18    1843    Antecubital   less than 1   Peripheral IV 11/05/18 Left Antecubital   11/05/18    2035    Antecubital   less than 1   PICC Double Lumen 12/22/15 PICC Left   12/22/15    -     1049          Intake/Output Last 24 hours  Intake/Output Summary (Last 24 hours) at 11/05/2018 2310 Last data filed at 11/05/2018 2128 Gross per 24 hour  Intake 1000 ml  Output -  Net 1000 ml    Labs/Imaging Results for orders placed or performed during the hospital encounter of 11/05/18 (from the past 48 hour(s))  Urinalysis, Routine w reflex microscopic     Status: Abnormal   Collection Time: 11/05/18  7:04 PM  Result Value Ref Range   Color, Urine YELLOW YELLOW   APPearance CLEAR CLEAR   Specific Gravity, Urine 1.015 1.005 - 1.030   pH 5.0 5.0 - 8.0   Glucose, UA NEGATIVE NEGATIVE mg/dL   Hgb urine dipstick NEGATIVE NEGATIVE   Bilirubin Urine NEGATIVE NEGATIVE   Ketones, ur 5 (A) NEGATIVE mg/dL   Protein, ur NEGATIVE NEGATIVE mg/dL   Nitrite NEGATIVE NEGATIVE   Leukocytes,Ua NEGATIVE NEGATIVE  Comment: Performed at [facility], [place] [address] [place], [place] [address]  CBC     Status: Abnormal   Collection Time: 11/05/18  7:04 PM  Result Value Ref Range   WBC 18.3 (H) 4.0 - 10.5 K/uL   RBC 5.35 4.22 - 5.81 MIL/uL   Hemoglobin 16.8 13.0 - 17.0 g/dL   HCT 50.7 39.0 - 52.0 %   MCV 94.8 80.0 - 100.0 fL   MCH 31.4 26.0 - 34.0 pg   MCHC 33.1 30.0 - 36.0 g/dL   RDW 17.0 (H) 11.5 - 15.5 %   Platelets 374 150 - 400 K/uL   nRBC 0.0 0.0 - 0.2 %    Comment: Performed at [facility], [place] [address], [place], [place] [address]  Comprehensive metabolic  panel     Status: Abnormal   Collection Time: 11/05/18  7:04 PM  Result Value Ref Range   Sodium 138 135 - 145 mmol/L   Potassium 3.6 3.5 - 5.1 mmol/L   Chloride 104 98 - 111 mmol/L   CO2 21 (L) 22 - 32 mmol/L   Glucose, Bld 115 (H) 70 - 99 mg/dL   BUN 18 8 - 23 mg/dL   Creatinine, Ser 1.32 (H) 0.61 - 1.24 mg/dL   Calcium 9.3 8.9 - 10.3 mg/dL   Total Protein 7.3 6.5 - 8.1 g/dL   Albumin 4.1 3.5 - 5.0 g/dL   AST 42 (H) 15 - 41 U/L   ALT 39 0 - 44 U/L   Alkaline Phosphatase 122 38 - 126 U/L   Total Bilirubin 0.9 0.3 - 1.2 mg/dL   GFR calc non Af Amer 55 (L) >60 mL/min   GFR calc Af Amer >60 >60 mL/min   Anion gap 13 5 - 15    Comment: Performed at [facility], [place] [address], [place], [place] [address]  Lipase, blood     Status: Abnormal   Collection Time: 11/05/18  7:04 PM  Result Value Ref Range   Lipase 166 (H) 11 - 51 U/L    Comment: Performed at [facility], [place] [address], [place], [place] [address]  Troponin I - Add-On to previous collection     Status: None   Collection Time: 11/05/18  7:04 PM  Result Value Ref Range   Troponin I <0.03 <0.03 ng/mL    Comment: Performed at [facility], [place] [address] [place], [place] [address]  Lactic acid, plasma     Status: Abnormal   Collection Time: 11/05/18  7:37 PM  Result Value Ref Range   Lactic Acid, Venous 3.0 (HH) 0.5 - 1.9 mmol/L    Comment: CRITICAL RESULT CALLED TO, READ BACK BY AND VERIFIED WITH: RN BROOKS AT 2003 11/05/18 CRUICKSHANK A Performed at [facility], [place] [address] [place], [place] [address]   SARS Coronavirus 2 (CEPHEID - Performed in [place] hospital lab), Hosp Order     Status: None   Collection Time: 11/05/18  7:37 PM  Result Value Ref Range   SARS Coronavirus 2 NEGATIVE NEGATIVE    Comment: (NOTE) If result is NEGATIVE SARS-CoV-2 target nucleic acids are NOT DETECTED. The SARS-CoV-2 RNA is generally detectable in  upper and lower  respiratory specimens during the acute phase of infection. The lowest  concentration of SARS-CoV-2 viral copies this assay can detect is 250  copies / mL. A negative result does not preclude SARS-CoV-2 infection  and should not be used  as the sole basis for treatment or other  patient management decisions.  A negative result may occur with  improper specimen collection / handling, submission of specimen other  than nasopharyngeal swab, presence of viral mutation(s) within the  areas targeted by this assay, and inadequate number of viral copies  (<250 copies / mL). A negative result must be combined with clinical  observations, patient history, and epidemiological information. If result is POSITIVE SARS-CoV-2 target nucleic acids are DETECTED. The SARS-CoV-2 RNA is generally detectable in upper and lower  respiratory specimens dur ing the acute phase of infection.  Positive  results are indicative of active infection with SARS-CoV-2.  Clinical  correlation with patient history and other diagnostic information is  necessary to determine patient infection status.  Positive results do  not rule out bacterial infection or co-infection with other viruses. If result is PRESUMPTIVE POSTIVE SARS-CoV-2 nucleic acids MAY BE PRESENT.   A presumptive positive result was obtained on the submitted specimen  and confirmed on repeat testing.  While 2019 novel coronavirus  (SARS-CoV-2) nucleic acids may be present in the submitted sample  additional confirmatory testing may be necessary for epidemiological  and / or clinical management purposes  to differentiate between  SARS-CoV-2 and other Sarbecovirus currently known to infect humans.  If clinically indicated additional testing with an alternate test  methodology [phone]) is advised. The SARS-CoV-2 RNA is generally  detectable in upper and lower respiratory sp ecimens during the acute  phase of infection. The expected result is  Negative. Fact Sheet for Patients:  StrictlyIdeas.no Fact Sheet for Healthcare Providers: BankingDealers.co.za This test is not yet approved or cleared by the [place] FDA and has been authorized for detection and/or diagnosis of SARS-CoV-2 by FDA under an Emergency Use Authorization (EUA).  This EUA will remain in effect (meaning this test can be used) for the duration of the COVID-19 declaration under Section 564(b)(1) of the Act, 21 U.S.C. section 360bbb-3(b)(1), unless the authorization is terminated or revoked sooner. Performed at [facility], [place] [address], [place], [place] [address]    Ct Abdomen Pelvis Wo Contrast  Result Date: 11/05/2018 CLINICAL DATA:  Nausea and vomiting. Shortness of breath. Possible adrenal crisis. EXAM: CT CHEST, ABDOMEN AND PELVIS WITHOUT CONTRAST TECHNIQUE: Multidetector CT imaging of the chest, abdomen and pelvis was performed following the standard protocol without IV contrast. COMPARISON:  10/21/2018 FINDINGS: CT CHEST FINDINGS Cardiovascular: Heart size is normal. No pericardial fluid. Minimal coronary artery calcification. Mild aortic atherosclerosis. Mediastinum/Nodes: No mass or adenopathy. Lungs/Pleura: Improving patchy infiltrate in the posterior left upper lobe. There is not yet complete clearing, but the trend is favorable. Atelectasis of the medial left lower lobe adjacent to the tortuous aorta is unchanged. Few other areas pulmonary scarring are stable. Evidence of pulmonary edema. No new infiltrate. No pleural effusion. Musculoskeletal: Ordinary spondylosis. CT ABDOMEN PELVIS FINDINGS Hepatobiliary: Liver appears normal without contrast. No calcified gallstones. Pancreas: Normal Spleen: Normal Adrenals/Urinary Tract: Adrenal glands are normal. Kidneys are normal. No evidence of stone disease. No bladder abnormality. Stomach/Bowel: Evidence of ileus or obstruction. Sigmoid  diverticulosis without evidence of diverticulitis presently. Vascular/Lymphatic: Aortic atherosclerotic calcification, mild. No aneurysm. IVC is normal. No retroperitoneal adenopathy. Lymphadenopathy at the gastrohepatic ligament is again seen is stable over time. Reproductive: Normal Other: No free fluid or air. Musculoskeletal: Small left inguinal hernia containing only fat. Ordinary degenerative changes affect the spine, including degenerative anterolisthesis at L4-5. IMPRESSION: No acute finding by CT. Subtotal resolution the patchy  abnormality previously seen in the posterior left upper lobe, consistent with resolving pneumonia. Similar appearance chronic atelectasis of the medial left lower lobe adjacent to the aorta. No acute or significant abdominal or pelvic finding. Aortic atherosclerosis. Sigmoid diverticulosis. Chronic spinal degenerative changes including degenerative anterolisthesis L4-5. Electronically Signed   By: Nelson Chimes M.D.   On: 11/05/2018 21:36   Ct Chest Wo Contrast  Result Date: 11/05/2018 CLINICAL DATA:  Nausea and vomiting. Shortness of breath. Possible adrenal crisis. EXAM: CT CHEST, ABDOMEN AND PELVIS WITHOUT CONTRAST TECHNIQUE: Multidetector CT imaging of the chest, abdomen and pelvis was performed following the standard protocol without IV contrast. COMPARISON:  10/21/2018 FINDINGS: CT CHEST FINDINGS Cardiovascular: Heart size is normal. No pericardial fluid. Minimal coronary artery calcification. Mild aortic atherosclerosis. Mediastinum/Nodes: No mass or adenopathy. Lungs/Pleura: Improving patchy infiltrate in the posterior left upper lobe. There is not yet complete clearing, but the trend is favorable. Atelectasis of the medial left lower lobe adjacent to the tortuous aorta is unchanged. Few other areas pulmonary scarring are stable. Evidence of pulmonary edema. No new infiltrate. No pleural effusion. Musculoskeletal: Ordinary spondylosis. CT ABDOMEN PELVIS FINDINGS  Hepatobiliary: Liver appears normal without contrast. No calcified gallstones. Pancreas: Normal Spleen: Normal Adrenals/Urinary Tract: Adrenal glands are normal. Kidneys are normal. No evidence of stone disease. No bladder abnormality. Stomach/Bowel: Evidence of ileus or obstruction. Sigmoid diverticulosis without evidence of diverticulitis presently. Vascular/Lymphatic: Aortic atherosclerotic calcification, mild. No aneurysm. IVC is normal. No retroperitoneal adenopathy. Lymphadenopathy at the gastrohepatic ligament is again seen is stable over time. Reproductive: Normal Other: No free fluid or air. Musculoskeletal: Small left inguinal hernia containing only fat. Ordinary degenerative changes affect the spine, including degenerative anterolisthesis at L4-5. IMPRESSION: No acute finding by CT. Subtotal resolution the patchy abnormality previously seen in the posterior left upper lobe, consistent with resolving pneumonia. Similar appearance chronic atelectasis of the medial left lower lobe adjacent to the aorta. No acute or significant abdominal or pelvic finding. Aortic atherosclerosis. Sigmoid diverticulosis. Chronic spinal degenerative changes including degenerative anterolisthesis L4-5. Electronically Signed   By: Nelson Chimes M.D.   On: 11/05/2018 21:36   Dg Chest Port 1 View  Result Date: 11/05/2018 CLINICAL DATA:  Shortness of breath EXAM: PORTABLE CHEST 1 VIEW COMPARISON:  10/21/2018 FINDINGS: The heart size and mediastinal contours are within normal limits. Both lungs are clear. The visualized skeletal structures are unremarkable. IMPRESSION: No active disease. Electronically Signed   By: Ulyses Jarred M.D.   On: 11/05/2018 19:21    Pending Labs Unresulted Labs (From admission, onward)    Start     Ordered   11/06/18 0500  CBC  Tomorrow morning,   R     11/05/18 2221   11/06/18 2119  Basic metabolic panel  Tomorrow morning,   R     11/05/18 2221   11/05/18 1904  Culture, blood (Routine X 2) w  Reflex to ID Panel  BLOOD CULTURE X 2,   R     11/05/18 1904          Vitals/Pain Today's Vitals   11/05/18 2130 11/05/18 2200 11/05/18 2300 11/05/18 2309  BP: 114/75 99/65 107/65   Pulse: 89 79 73   Resp: (!) 24 (!) 25 (!) 21   Temp:      TempSrc:      SpO2: 93% 92% 93%   PainSc:    0-No pain    Isolation Precautions No active isolations  Medications Medications  acyclovir (ZOVIRAX) tablet 400 mg (has  no administration in time range)  albuterol (VENTOLIN HFA) 108 (90 Base) MCG/ACT inhaler 1-2 puff (has no administration in time range)  aspirin EC tablet 81 mg (has no administration in time range)  azithromycin (ZITHROMAX) tablet 250 mg (has no administration in time range)  Vitamin D CAPS 2,000 Units (has no administration in time range)  fluticasone (FLONASE) 50 MCG/ACT nasal spray 2 spray (has no administration in time range)  fluticasone (FLOVENT HFA) 220 MCG/ACT inhaler 2 puff (has no administration in time range)  loratadine (CLARITIN) tablet 10 mg (has no administration in time range)  montelukast (SINGULAIR) tablet 10 mg (has no administration in time range)  pantoprazole (PROTONIX) EC tablet 80 mg (has no administration in time range)  posaconazole (NOXAFIL) delayed-release tablet 300 mg (has no administration in time range)  hydrocortisone sodium succinate (SOLU-CORTEF) 100 MG injection 50 mg (has no administration in time range)  acetaminophen (TYLENOL) tablet 650 mg (has no administration in time range)    Or  acetaminophen (TYLENOL) suppository 650 mg (has no administration in time range)  ondansetron (ZOFRAN) tablet 4 mg (has no administration in time range)    Or  ondansetron (ZOFRAN) injection 4 mg (has no administration in time range)  enoxaparin (LOVENOX) injection 40 mg (has no administration in time range)  0.9 %  sodium chloride infusion (has no administration in time range)  ondansetron (ZOFRAN) injection 4 mg (4 mg Intravenous Given 11/05/18 1850)   sodium chloride 0.9 % bolus 1,000 mL (0 mLs Intravenous Stopped 11/05/18 2128)  LORazepam (ATIVAN) injection 0.5 mg (0.5 mg Intravenous Given 11/05/18 2040)  hydrocortisone sodium succinate (SOLU-CORTEF) 100 MG injection 50 mg (50 mg Intravenous Given 11/05/18 2220)  sodium chloride 0.9 % bolus 1,000 mL (1,000 mLs Intravenous New Bag/Given 11/05/18 2219)    Mobility walks     Focused Assessments Pulmonary Assessment Handoff:  Lung sounds:   O2 Device: Room Air        R Recommendations: See Admitting Provider Note  Report given to:   Additional Notes:

## 2018-11-05 NOTE — ED Notes (Signed)
Murtis Sink (wife) was called and updated on patient's disposition

## 2018-11-06 ENCOUNTER — Inpatient Hospital Stay: Payer: Medicare Other | Attending: Hematology and Oncology

## 2018-11-06 ENCOUNTER — Encounter (HOSPITAL_COMMUNITY): Payer: Self-pay

## 2018-11-06 ENCOUNTER — Other Ambulatory Visit: Payer: Self-pay

## 2018-11-06 DIAGNOSIS — R112 Nausea with vomiting, unspecified: Secondary | ICD-10-CM | POA: Diagnosis present

## 2018-11-06 DIAGNOSIS — Z7982 Long term (current) use of aspirin: Secondary | ICD-10-CM | POA: Diagnosis not present

## 2018-11-06 DIAGNOSIS — Z803 Family history of malignant neoplasm of breast: Secondary | ICD-10-CM | POA: Diagnosis not present

## 2018-11-06 DIAGNOSIS — Z9481 Bone marrow transplant status: Secondary | ICD-10-CM | POA: Diagnosis not present

## 2018-11-06 DIAGNOSIS — R0602 Shortness of breath: Secondary | ICD-10-CM | POA: Diagnosis present

## 2018-11-06 DIAGNOSIS — I10 Essential (primary) hypertension: Secondary | ICD-10-CM | POA: Diagnosis present

## 2018-11-06 DIAGNOSIS — E872 Acidosis: Secondary | ICD-10-CM | POA: Diagnosis present

## 2018-11-06 DIAGNOSIS — Z7951 Long term (current) use of inhaled steroids: Secondary | ICD-10-CM | POA: Diagnosis not present

## 2018-11-06 DIAGNOSIS — D89813 Graft-versus-host disease, unspecified: Secondary | ICD-10-CM | POA: Diagnosis present

## 2018-11-06 DIAGNOSIS — Z823 Family history of stroke: Secondary | ICD-10-CM | POA: Diagnosis not present

## 2018-11-06 DIAGNOSIS — Z9484 Stem cells transplant status: Secondary | ICD-10-CM | POA: Diagnosis not present

## 2018-11-06 DIAGNOSIS — E272 Addisonian crisis: Secondary | ICD-10-CM | POA: Diagnosis present

## 2018-11-06 DIAGNOSIS — Z7952 Long term (current) use of systemic steroids: Secondary | ICD-10-CM | POA: Diagnosis not present

## 2018-11-06 DIAGNOSIS — Z8701 Personal history of pneumonia (recurrent): Secondary | ICD-10-CM | POA: Diagnosis not present

## 2018-11-06 DIAGNOSIS — Z20828 Contact with and (suspected) exposure to other viral communicable diseases: Secondary | ICD-10-CM | POA: Diagnosis present

## 2018-11-06 DIAGNOSIS — Z820 Family history of epilepsy and other diseases of the nervous system: Secondary | ICD-10-CM | POA: Diagnosis not present

## 2018-11-06 DIAGNOSIS — Z79899 Other long term (current) drug therapy: Secondary | ICD-10-CM | POA: Diagnosis not present

## 2018-11-06 LAB — CBC
HCT: 38.5 % — ABNORMAL LOW (ref 39.0–52.0)
Hemoglobin: 12.7 g/dL — ABNORMAL LOW (ref 13.0–17.0)
MCH: 31.4 pg (ref 26.0–34.0)
MCHC: 33 g/dL (ref 30.0–36.0)
MCV: 95.3 fL (ref 80.0–100.0)
Platelets: 281 10*3/uL (ref 150–400)
RBC: 4.04 MIL/uL — ABNORMAL LOW (ref 4.22–5.81)
RDW: 16.8 % — ABNORMAL HIGH (ref 11.5–15.5)
WBC: 11.5 10*3/uL — ABNORMAL HIGH (ref 4.0–10.5)
nRBC: 0 % (ref 0.0–0.2)

## 2018-11-06 LAB — BASIC METABOLIC PANEL
Anion gap: 8 (ref 5–15)
BUN: 24 mg/dL — ABNORMAL HIGH (ref 8–23)
CO2: 20 mmol/L — ABNORMAL LOW (ref 22–32)
Calcium: 7.6 mg/dL — ABNORMAL LOW (ref 8.9–10.3)
Chloride: 111 mmol/L (ref 98–111)
Creatinine, Ser: 1.2 mg/dL (ref 0.61–1.24)
GFR calc Af Amer: >60 mL/min (ref >60)
GFR calc non Af Amer: >60 mL/min (ref >60)
Glucose, Bld: 166 mg/dL — ABNORMAL HIGH (ref 70–99)
Potassium: 4 mmol/L (ref 3.5–5.1)
Sodium: 139 mmol/L (ref 135–145)

## 2018-11-06 MED ORDER — HYDROCORTISONE NA SUCCINATE PF 100 MG IJ SOLR
50.0000 mg | Freq: Two times a day (BID) | INTRAMUSCULAR | Status: DC
  Administered 2018-11-06 – 2018-11-07 (×2): 50 mg via INTRAVENOUS
  Filled 2018-11-06 (×2): qty 2

## 2018-11-06 NOTE — Progress Notes (Signed)
PROGRESS NOTE    Brandon [name]  NUU:[id number] DOB: 30-Oct-1950 DOA: 11/05/2018 PCP: [name], MD    Brief Narrative:  68 year old gentleman with extensive medical history significant for MDS status post bone marrow transplant, chronic steroids for GVHD, adrenal insufficiency with episodes of adrenal crisis requiring hospitalizations.  Recent multiple hospitalizations.  Most recently admitted 2 weeks ago for adrenal crisis in the setting of pneumonia treated with antibiotics and discharged on increased dose of steroids.  Patient presented back to the emergency room with nausea vomiting that was abrupt onset.  He was taking 30 mg of hydrocortisone a day lately.  His admit to the hospital, treated with IV fluids as well as increasing dose of hydrocortisone with clinical response.   Assessment & Plan:   Principal Problem:   Adrenal crisis [facility]) Active Problems:   Essential hypertension   History of stem cell transplant ([place])   GVHD (graft versus host disease) ([place])   Adrenocortical insufficiency (H[facility])  Adrenal crisis: Symptoms improved with injectable hydrocortisone in the emergency room.  Blood pressure responded well to fluid resuscitation. UA negative, COVID-19 negative CT scan chest abdomen pelvis was essentially normal.  CT chest showed improving consolidation and airspace disease.  Afebrile.  WBC count was 18,000, improved to 11,000 with IV fluid resuscitation. We will continue on Solu-Cortef 50 mg twice a day today.  We will discontinue IV fluids.  Advance activities.  Monitor. We will discuss with his endocrinologist at the time of discharge for long-term steroid planning.  Leukocytosis/lactic acidosis/hypotension: Patient had no evidence of infection.  We will continue to monitor.  Holding off for antibiotics.  Hypertension: Holding blood pressure medications at this time.  History of bone marrow transplant with GVHD: Follows up with transplant clinic at [facility].  Stress  dose steroids as above.  Continue on azithromycin acyclovir and posaconazole that he is taking from home.  Patient continues to need hospitalization.  He needs IV steroids and monitoring in the hospital.  Anticipate hospital stay more than 2 midnights.  Will change to inpatient.  DVT prophylaxis: Lovenox Code Status: Full code Family Communication: Patient Disposition Plan: Inpatient   Consultants:   None  Procedures:   None  Antimicrobials:   None   Subjective: Patient seen and examined.  Discussed in detail about his current findings.  Patient also discussed with his transplant team at [facility].  His nausea and vomiting improved.  He will try to eat regular food.  Blood pressure responded to fluid and steroids. No fever or chills.  No cough.  Objective: Vitals:   11/05/18 2350 11/06/18 0541 11/06/18 0829 11/06/18 1235  BP: 120/69 107/66  116/69  Pulse: 75 (!) 58 (!) 55 (!) 55  Resp: 16 20 12 20   Temp: 99.2 F (37.3 C) 98.9 F (37.2 C)  97.9 F (36.6 C)  TempSrc: Oral Oral  Oral  SpO2: 97% 96% 95% 99%  Weight: 104.4 kg     Height: 5\' 10"  (1.778 m)       Intake/Output Summary (Last 24 hours) at 11/06/2018 1327 Last data filed at 11/06/2018 1015 Gross per 24 hour  Intake 2830.35 ml  Output --  Net 2830.35 ml   Filed Weights   11/05/18 2350  Weight: 104.4 kg    Examination:  General exam: Appears calm and comfortable, on room air. Respiratory system: Clear to auscultation. Respiratory effort normal. Cardiovascular system: S1 & S2 heard, RRR. No JVD, murmurs, rubs, gallops or clicks.  2+ bilateral pedal edema. Gastrointestinal  system: Abdomen is nondistended, soft and nontender. No organomegaly or masses felt. Normal bowel sounds heard.  Obese. Central nervous system: Alert and oriented. No focal neurological deficits. Extremities: Symmetric 5 x 5 power. Skin: No rashes, lesions or ulcers Psychiatry: Judgement and insight appear normal. Mood & affect  appropriate.     Data Reviewed: I have personally reviewed following labs and imaging studies  CBC: Recent Labs  Lab 11/05/18 1904 11/06/18 0049  WBC 18.3* 11.5*  HGB 16.8 12.7*  HCT 50.7 38.5*  MCV 94.8 95.3  PLT 374 409   Basic Metabolic Panel: Recent Labs  Lab 11/05/18 1904 11/06/18 0049  NA 138 139  K 3.6 4.0  CL 104 111  CO2 21* 20*  GLUCOSE 115* 166*  BUN 18 24*  CREATININE 1.32* 1.20  CALCIUM 9.3 7.6*   GFR: Estimated Creatinine Clearance: 70.3 mL/min (by C-G formula based on SCr of 1.2 mg/dL). Liver Function Tests: Recent Labs  Lab 11/05/18 1904  AST 42*  ALT 39  ALKPHOS 122  BILITOT 0.9  PROT 7.3  ALBUMIN 4.1   Recent Labs  Lab 11/05/18 1904  LIPASE 166*   No results for input(s): AMMONIA in the last 168 hours. Coagulation Profile: No results for input(s): INR, PROTIME in the last 168 hours. Cardiac Enzymes: Recent Labs  Lab 11/05/18 1904  TROPONINI <0.03   BNP (last 3 results) No results for input(s): PROBNP in the last 8760 hours. HbA1C: No results for input(s): HGBA1C in the last 72 hours. CBG: No results for input(s): GLUCAP in the last 168 hours. Lipid Profile: No results for input(s): CHOL, HDL, LDLCALC, TRIG, CHOLHDL, LDLDIRECT in the last 72 hours. Thyroid Function Tests: No results for input(s): TSH, T4TOTAL, FREET4, T3FREE, THYROIDAB in the last 72 hours. Anemia Panel: No results for input(s): VITAMINB12, FOLATE, FERRITIN, TIBC, IRON, RETICCTPCT in the last 72 hours. Sepsis Labs: Recent Labs  Lab 11/05/18 1937  LATICACIDVEN 3.0*    Recent Results (from the past 240 hour(s))  SARS Coronavirus 2 (CEPHEID - Performed in [facility] hospital lab), Hosp Order     Status: None   Collection Time: 11/05/18  7:37 PM  Result Value Ref Range Status   SARS Coronavirus 2 NEGATIVE NEGATIVE Final    Comment: (NOTE) If result is NEGATIVE SARS-CoV-2 target nucleic acids are NOT DETECTED. The SARS-CoV-2 RNA is generally detectable  in upper and lower  respiratory specimens during the acute phase of infection. The lowest  concentration of SARS-CoV-2 viral copies this assay can detect is 250  copies / mL. A negative result does not preclude SARS-CoV-2 infection  and should not be used as the sole basis for treatment or other  patient management decisions.  A negative result may occur with  improper specimen collection / handling, submission of specimen other  than nasopharyngeal swab, presence of viral mutation(s) within the  areas targeted by this assay, and inadequate number of viral copies  (<250 copies / mL). A negative result must be combined with clinical  observations, patient history, and epidemiological information. If result is POSITIVE SARS-CoV-2 target nucleic acids are DETECTED. The SARS-CoV-2 RNA is generally detectable in upper and lower  respiratory specimens dur ing the acute phase of infection.  Positive  results are indicative of active infection with SARS-CoV-2.  Clinical  correlation with patient history and other diagnostic information is  necessary to determine patient infection status.  Positive results do  not rule out bacterial infection or co-infection with other viruses. If result  is PRESUMPTIVE POSTIVE SARS-CoV-2 nucleic acids MAY BE PRESENT.   A presumptive positive result was obtained on the submitted specimen  and confirmed on repeat testing.  While 2019 novel coronavirus  (SARS-CoV-2) nucleic acids may be present in the submitted sample  additional confirmatory testing may be necessary for epidemiological  and / or clinical management purposes  to differentiate between  SARS-CoV-2 and other Sarbecovirus currently known to infect humans.  If clinically indicated additional testing with an alternate test  methodology [phone]) is advised. The SARS-CoV-2 RNA is generally  detectable in upper and lower respiratory sp ecimens during the acute  phase of infection. The expected result is  Negative. Fact Sheet for Patients:  StrictlyIdeas.no Fact Sheet for Healthcare Providers: BankingDealers.co.za This test is not yet approved or cleared by the [place] FDA and has been authorized for detection and/or diagnosis of SARS-CoV-2 by FDA under an Emergency Use Authorization (EUA).  This EUA will remain in effect (meaning this test can be used) for the duration of the COVID-19 declaration under Section 564(b)(1) of the Act, 21 U.S.C. section 360bbb-3(b)(1), unless the authorization is terminated or revoked sooner. Performed at [facility], [place] [address], [place], [place] [address]   Culture, blood (Routine X 2) w Reflex to ID Panel     Status: None (Preliminary result)   Collection Time: 11/05/18  8:22 PM  Result Value Ref Range Status   Specimen Description   Final    BLOOD LEFT ANTECUBITAL Performed at [facility] [address], [place], [place] [address]    Special Requests   Final    BOTTLES DRAWN AEROBIC AND ANAEROBIC Blood Culture adequate volume Performed at [facility] [address], [place], [place] [address]    Culture   Final    NO GROWTH < 24 HOURS Performed at [facility] [address] [place], [place] [address]    Report Status PENDING  Incomplete  Culture, blood (Routine X 2) w Reflex to ID Panel     Status: None (Preliminary result)   Collection Time: 11/06/18 12:49 AM  Result Value Ref Range Status   Specimen Description   Final    BLOOD RIGHT HAND Performed at [facility], [place] [address], [place], [place] [address]    Special Requests   Final    BOTTLES DRAWN AEROBIC AND ANAEROBIC Blood Culture adequate volume Performed at [facility] [address] [place], [place] [address]    Culture PENDING  Incomplete   Report Status PENDING  Incomplete         Radiology Studies: Ct Abdomen Pelvis Wo  Contrast  Result Date: 11/05/2018 CLINICAL DATA:  Nausea and vomiting. Shortness of breath. Possible adrenal crisis. EXAM: CT CHEST, ABDOMEN AND PELVIS WITHOUT CONTRAST TECHNIQUE: Multidetector CT imaging of the chest, abdomen and pelvis was performed following the standard protocol without IV contrast. COMPARISON:  10/21/2018 FINDINGS: CT CHEST FINDINGS Cardiovascular: Heart size is normal. No pericardial fluid. Minimal coronary artery calcification. Mild aortic atherosclerosis. Mediastinum/Nodes: No mass or adenopathy. Lungs/Pleura: Improving patchy infiltrate in the posterior left upper lobe. There is not yet complete clearing, but the trend is favorable. Atelectasis of the medial left lower lobe adjacent to the tortuous aorta is unchanged. Few other areas pulmonary scarring are stable. Evidence of pulmonary edema. No new infiltrate. No pleural effusion. Musculoskeletal: Ordinary spondylosis. CT ABDOMEN PELVIS FINDINGS Hepatobiliary: Liver appears normal without contrast. No calcified gallstones. Pancreas: Normal Spleen:  Normal Adrenals/Urinary Tract: Adrenal glands are normal. Kidneys are normal. No evidence of stone disease. No bladder abnormality. Stomach/Bowel: Evidence of ileus or obstruction. Sigmoid diverticulosis without evidence of diverticulitis presently. Vascular/Lymphatic: Aortic atherosclerotic calcification, mild. No aneurysm. IVC is normal. No retroperitoneal adenopathy. Lymphadenopathy at the gastrohepatic ligament is again seen is stable over time. Reproductive: Normal Other: No free fluid or air. Musculoskeletal: Small left inguinal hernia containing only fat. Ordinary degenerative changes affect the spine, including degenerative anterolisthesis at L4-5. IMPRESSION: No acute finding by CT. Subtotal resolution the patchy abnormality previously seen in the posterior left upper lobe, consistent with resolving pneumonia. Similar appearance chronic atelectasis of the medial left lower lobe  adjacent to the aorta. No acute or significant abdominal or pelvic finding. Aortic atherosclerosis. Sigmoid diverticulosis. Chronic spinal degenerative changes including degenerative anterolisthesis L4-5. Electronically Signed   By: Nelson Chimes M.D.   On: 11/05/2018 21:36   Ct Chest Wo Contrast  Result Date: 11/05/2018 CLINICAL DATA:  Nausea and vomiting. Shortness of breath. Possible adrenal crisis. EXAM: CT CHEST, ABDOMEN AND PELVIS WITHOUT CONTRAST TECHNIQUE: Multidetector CT imaging of the chest, abdomen and pelvis was performed following the standard protocol without IV contrast. COMPARISON:  10/21/2018 FINDINGS: CT CHEST FINDINGS Cardiovascular: Heart size is normal. No pericardial fluid. Minimal coronary artery calcification. Mild aortic atherosclerosis. Mediastinum/Nodes: No mass or adenopathy. Lungs/Pleura: Improving patchy infiltrate in the posterior left upper lobe. There is not yet complete clearing, but the trend is favorable. Atelectasis of the medial left lower lobe adjacent to the tortuous aorta is unchanged. Few other areas pulmonary scarring are stable. Evidence of pulmonary edema. No new infiltrate. No pleural effusion. Musculoskeletal: Ordinary spondylosis. CT ABDOMEN PELVIS FINDINGS Hepatobiliary: Liver appears normal without contrast. No calcified gallstones. Pancreas: Normal Spleen: Normal Adrenals/Urinary Tract: Adrenal glands are normal. Kidneys are normal. No evidence of stone disease. No bladder abnormality. Stomach/Bowel: Evidence of ileus or obstruction. Sigmoid diverticulosis without evidence of diverticulitis presently. Vascular/Lymphatic: Aortic atherosclerotic calcification, mild. No aneurysm. IVC is normal. No retroperitoneal adenopathy. Lymphadenopathy at the gastrohepatic ligament is again seen is stable over time. Reproductive: Normal Other: No free fluid or air. Musculoskeletal: Small left inguinal hernia containing only fat. Ordinary degenerative changes affect the spine,  including degenerative anterolisthesis at L4-5. IMPRESSION: No acute finding by CT. Subtotal resolution the patchy abnormality previously seen in the posterior left upper lobe, consistent with resolving pneumonia. Similar appearance chronic atelectasis of the medial left lower lobe adjacent to the aorta. No acute or significant abdominal or pelvic finding. Aortic atherosclerosis. Sigmoid diverticulosis. Chronic spinal degenerative changes including degenerative anterolisthesis L4-5. Electronically Signed   By: Nelson Chimes M.D.   On: 11/05/2018 21:36   Dg Chest Port 1 View  Result Date: 11/05/2018 CLINICAL DATA:  Shortness of breath EXAM: PORTABLE CHEST 1 VIEW COMPARISON:  10/21/2018 FINDINGS: The heart size and mediastinal contours are within normal limits. Both lungs are clear. The visualized skeletal structures are unremarkable. IMPRESSION: No active disease. Electronically Signed   By: Ulyses Jarred M.D.   On: 11/05/2018 19:21        Scheduled Meds:  acyclovir  400 mg Oral BID   aspirin EC  81 mg Oral QHS   [START ON 11/07/2018] azithromycin  250 mg Oral Q M,W,F   budesonide  0.5 mg Inhalation BID   cholecalciferol  2,000 Units Oral QHS   enoxaparin (LOVENOX) injection  40 mg Subcutaneous Daily   hydrocortisone sod succinate (SOLU-CORTEF) inj  50 mg Intravenous Q12H   loratadine  10 mg Oral Daily   montelukast  10 mg Oral Daily   pantoprazole  80 mg Oral Daily   posaconazole  300 mg Oral Daily   Continuous Infusions:   LOS: 0 days    Time spent: 25 minutes    Barb Merino, MD [facility] Pager [phone]  If 7PM-7AM, please contact night-coverage [email or web address] Password [email or web address] 11/06/2018, 1:27 PM

## 2018-11-07 ENCOUNTER — Emergency Department (HOSPITAL_COMMUNITY)
Admission: EM | Admit: 2018-11-07 | Discharge: 2018-11-08 | Disposition: A | Discharge status: Other Acute Inpatient Hospital | Payer: Medicare Other | Attending: Emergency Medicine | Admitting: Emergency Medicine

## 2018-11-07 ENCOUNTER — Other Ambulatory Visit: Payer: Self-pay

## 2018-11-07 ENCOUNTER — Encounter (HOSPITAL_COMMUNITY): Payer: Self-pay | Admitting: Emergency Medicine

## 2018-11-07 DIAGNOSIS — Z79899 Other long term (current) drug therapy: Secondary | ICD-10-CM | POA: Insufficient documentation

## 2018-11-07 DIAGNOSIS — R110 Nausea: Secondary | ICD-10-CM

## 2018-11-07 DIAGNOSIS — R112 Nausea with vomiting, unspecified: Secondary | ICD-10-CM | POA: Insufficient documentation

## 2018-11-07 DIAGNOSIS — I10 Essential (primary) hypertension: Secondary | ICD-10-CM | POA: Insufficient documentation

## 2018-11-07 DIAGNOSIS — Z7982 Long term (current) use of aspirin: Secondary | ICD-10-CM | POA: Insufficient documentation

## 2018-11-07 LAB — BASIC METABOLIC PANEL
Anion gap: 6 (ref 5–15)
BUN: 12 mg/dL (ref 8–23)
CO2: 22 mmol/L (ref 22–32)
Calcium: 8 mg/dL — ABNORMAL LOW (ref 8.9–10.3)
Chloride: 110 mmol/L (ref 98–111)
Creatinine, Ser: 0.81 mg/dL (ref 0.61–1.24)
GFR calc Af Amer: >60 mL/min (ref >60)
GFR calc non Af Amer: >60 mL/min (ref >60)
Glucose, Bld: 119 mg/dL — ABNORMAL HIGH (ref 70–99)
Potassium: 3.7 mmol/L (ref 3.5–5.1)
Sodium: 138 mmol/L (ref 135–145)

## 2018-11-07 LAB — COMPREHENSIVE METABOLIC PANEL WITH GFR
ALT: 36 U/L (ref 0–44)
AST: 35 U/L (ref 15–41)
Albumin: 3.4 g/dL — ABNORMAL LOW (ref 3.5–5.0)
Alkaline Phosphatase: 110 U/L (ref 38–126)
Anion gap: 11 (ref 5–15)
BUN: 13 mg/dL (ref 8–23)
CO2: 19 mmol/L — ABNORMAL LOW (ref 22–32)
Calcium: 8.6 mg/dL — ABNORMAL LOW (ref 8.9–10.3)
Chloride: 106 mmol/L (ref 98–111)
Creatinine, Ser: 0.93 mg/dL (ref 0.61–1.24)
GFR calc Af Amer: >60 mL/min (ref >60)
GFR calc non Af Amer: >60 mL/min (ref >60)
Glucose, Bld: 122 mg/dL — ABNORMAL HIGH (ref 70–99)
Potassium: 3.4 mmol/L — ABNORMAL LOW (ref 3.5–5.1)
Sodium: 136 mmol/L (ref 135–145)
Total Bilirubin: 1 mg/dL (ref 0.3–1.2)
Total Protein: 6.5 g/dL (ref 6.5–8.1)

## 2018-11-07 LAB — CBC WITH DIFFERENTIAL/PLATELET
Abs Immature Granulocytes: 0.03 10*3/uL (ref 0.00–0.07)
Basophils Absolute: 0 10*3/uL (ref 0.0–0.1)
Basophils Relative: 0 %
Eosinophils Absolute: 0.1 10*3/uL (ref 0.0–0.5)
Eosinophils Relative: 1 %
HCT: 34.4 % — ABNORMAL LOW (ref 39.0–52.0)
Hemoglobin: 11.2 g/dL — ABNORMAL LOW (ref 13.0–17.0)
Immature Granulocytes: 0 %
Lymphocytes Relative: 23 %
Lymphs Abs: 1.6 10*3/uL (ref 0.7–4.0)
MCH: 31 pg (ref 26.0–34.0)
MCHC: 32.6 g/dL (ref 30.0–36.0)
MCV: 95.3 fL (ref 80.0–100.0)
Monocytes Absolute: 0.7 10*3/uL (ref 0.1–1.0)
Monocytes Relative: 10 %
Neutro Abs: 4.5 10*3/uL (ref 1.7–7.7)
Neutrophils Relative %: 66 %
Platelets: 207 10*3/uL (ref 150–400)
RBC: 3.61 MIL/uL — ABNORMAL LOW (ref 4.22–5.81)
RDW: 17.2 % — ABNORMAL HIGH (ref 11.5–15.5)
WBC: 6.8 10*3/uL (ref 4.0–10.5)
nRBC: 0 % (ref 0.0–0.2)

## 2018-11-07 LAB — CBC
HCT: 41.8 % (ref 39.0–52.0)
Hemoglobin: 13.9 g/dL (ref 13.0–17.0)
MCH: 31 pg (ref 26.0–34.0)
MCHC: 33.3 g/dL (ref 30.0–36.0)
MCV: 93.3 fL (ref 80.0–100.0)
Platelets: 296 K/uL (ref 150–400)
RBC: 4.48 MIL/uL (ref 4.22–5.81)
RDW: 17 % — ABNORMAL HIGH (ref 11.5–15.5)
WBC: 6.5 K/uL (ref 4.0–10.5)
nRBC: 0 % (ref 0.0–0.2)

## 2018-11-07 LAB — URINALYSIS, ROUTINE W REFLEX MICROSCOPIC
Bilirubin Urine: NEGATIVE
Glucose, UA: NEGATIVE mg/dL
Hgb urine dipstick: NEGATIVE
Ketones, ur: NEGATIVE mg/dL
Leukocytes,Ua: NEGATIVE
Nitrite: NEGATIVE
Protein, ur: NEGATIVE mg/dL
Specific Gravity, Urine: 1.011 (ref 1.005–1.030)
pH: 7 (ref 5.0–8.0)

## 2018-11-07 LAB — LIPASE, BLOOD: Lipase: 24 U/L (ref 11–51)

## 2018-11-07 LAB — LACTIC ACID, PLASMA: Lactic Acid, Venous: 1.3 mmol/L (ref 0.5–1.9)

## 2018-11-07 MED ORDER — SODIUM CHLORIDE 0.9% FLUSH
3.0000 mL | Freq: Once | INTRAVENOUS | Status: AC
  Administered 2018-11-07: 3 mL via INTRAVENOUS

## 2018-11-07 MED ORDER — ATENOLOL 25 MG PO TABS
25.0000 mg | ORAL_TABLET | Freq: Every day | ORAL | Status: DC
  Administered 2018-11-07: 25 mg via ORAL
  Filled 2018-11-07: qty 1

## 2018-11-07 MED ORDER — HYDROCORTISONE 20 MG PO TABS
20.0000 mg | ORAL_TABLET | Freq: Three times a day (TID) | ORAL | Status: DC
  Administered 2018-11-07: 20 mg via ORAL
  Filled 2018-11-07: qty 1

## 2018-11-07 MED ORDER — ONDANSETRON HCL 4 MG/2ML IJ SOLN
4.0000 mg | Freq: Once | INTRAMUSCULAR | Status: AC
  Administered 2018-11-07: 4 mg via INTRAVENOUS
  Filled 2018-11-07: qty 2

## 2018-11-07 NOTE — Progress Notes (Signed)
   11/07/18 0900  Clinical Encounter Type  Visited With Patient  Visit Type Initial;Psychological support;Spiritual support  Referral From Nurse  Consult/Referral To Chaplain  Spiritual Encounters  Spiritual Needs Emotional;Other (Comment) (Spiritual Care Conversation/Support)  Stress Factors  Patient Stress Factors None identified   I spoke with Brandon [name] per spiritual care consult. He did not identify any specific needs at this time. Brandon [name] said that he was Catholic, but would appreciate a follow-up visit.  I will follow up with him at a later time.   Chaplain Shanon Ace M.Div., [facility]

## 2018-11-07 NOTE — ED Notes (Signed)
Bed: NS25 Expected date:  Expected time:  Means of arrival:  Comments: Negative pressure

## 2018-11-07 NOTE — Progress Notes (Signed)
   11/07/18 1435  Clinical Encounter Type  Visited With Patient  Visit Type Initial  Referral From Chaplain  Consult/Referral To [facility]  This chaplain responded to Chaplain-BV referral for Pt. follow up. The chaplain participated in story telling with the Pt and heard the Pt.'s security in his faith.  The chaplain prayed with the Pt. at the close of the visit.   The chaplain is available for F/U spiritual care as needed.

## 2018-11-07 NOTE — ED Provider Notes (Signed)
[facility] DEPT Provider Note   CSN: [id number] Arrival date & time: 11/07/18  2041    History   Chief Complaint Chief Complaint  Patient presents with  . Nausea  . Emesis    HPI Brandon [name] is a 68 y.o. male.     68 year old male with prior medical as detailed below presents for evaluation of nausea and vomiting.  Patient was just discharged from this facility earlier this afternoon.  Patient with longstanding history of adrenal insufficiency.  His recent hospitalization was secondary to persistent nausea vomiting thought to be secondary to adrenal insufficiency.  Patient reports that his symptoms returned upon arrival to home.  He has received a majority of his post bone marrow transplant care at [facility].  He presents to the ED this evening requesting transfer to [facility] for further care and treatment.  Patient denies recent fever, chest pain, shortness of breath, abdominal pain, or other new complaint.  Of note, patient had a negative COVID screen 48 hours ago.  The history is provided by the patient and medical records.  Emesis  Severity:  Moderate Duration:  5 hours Timing:  Constant Progression:  Worsening Chronicity:  New Recent urination:  Normal Relieved by:  Nothing Worsened by:  Nothing Ineffective treatments:  None tried Associated symptoms: no abdominal pain and no fever     Past Medical History:  Diagnosis Date  . Adrenocortical insufficiency ([place]) 08/06/2017   Patient with 2 hospitalizations in January 2019- treated with fluids, antibiotics, steroids- ultimately thought most likely due to adrenal insufficiency with no obvious source of infection found  . Allergy    seasonal/animals  . Chronic prostatitis 12/17/2008  . Diverticulosis 01/16/2011   History of diverticulitis   . ERECTILE DYSFUNCTION 02/07/2007  . HYPERLIPIDEMIA 02/04/2007  . HYPERTENSION 02/04/2007  . NEOPLASM, MALIGNANT, SKIN, BACK 02/09/2009  . Pneumonia, viral  12/2017  . S/P bone marrow transplant [facility])     Patient Active Problem List   Diagnosis Date Noted  . Adrenal crisis ([place]) 11/05/2018  . History of DVT (deep vein thrombosis) 09/25/2018  . Adrenocortical insufficiency ([place]) 08/06/2017  . Post chemotherapy Dry eyes due to decreased tear production 08/06/2017  . post chemotherapy Peripheral neuropathy 08/06/2017  . History of stem cell transplant ([place]) 03/04/2017  . GVHD (graft versus host disease) ([place]) 03/04/2017  . MDS (myelodysplastic syndrome) ([place]) 12/06/2015  . BPH associated with nocturia 11/15/2014  . Leukopenia 11/15/2014  . Steroid-induced hyperglycemia 08/16/2014  . Chest pain 08/13/2014  . GERD (gastroesophageal reflux disease) 05/03/2014  . Cervical disc disorder with radiculopathy of cervical region 07/14/2010  . MIXED HEARING LOSS BILATERAL 07/14/2010  . ERECTILE DYSFUNCTION 02/07/2007  . Hyperlipidemia 02/04/2007  . Essential hypertension 02/04/2007    Past Surgical History:  Procedure Laterality Date  . CATARACT EXTRACTION Left 06/2018  . none          Home Medications    Prior to Admission medications   Medication Sig Start Date End Date Taking? Authorizing Provider  acyclovir (ZOVIRAX) 400 MG tablet Take 400 mg by mouth 2 (two) times daily.    [provider]  albuterol (PROVENTIL HFA;VENTOLIN HFA) 108 (90 Base) MCG/ACT inhaler Inhale 1-2 puffs into the lungs every 6 (six) hours as needed for wheezing or shortness of breath.     [provider]  amLODipine (NORVASC) 2.5 MG tablet Take 1 tablet (2.5 mg total) by mouth daily. 02/21/18   [name], MD  aspirin EC 81 MG  tablet Take 81 mg by mouth at bedtime.    [provider]  atenolol (TENORMIN) 50 MG tablet Take 0.5 tablets (25 mg total) by mouth every evening. Patient taking differently: Take 50 mg by mouth every evening.  07/21/17   Desiree Hane, MD  azithromycin (ZITHROMAX) 250 MG tablet Take 250 mg by mouth every  Monday, Wednesday, and Friday.  10/30/18   [provider]  Cholecalciferol (VITAMIN D) 50 MCG (2000 UT) CAPS Take 2,000 Units by mouth at bedtime.     [provider]  fluticasone (FLONASE) 50 MCG/ACT nasal spray Place 2 sprays into both nostrils daily as needed for allergies or rhinitis.    [provider]  fluticasone (FLOVENT HFA) 220 MCG/ACT inhaler Inhale 2 puffs into the lungs 2 (two) times daily.     [provider]  hydrocortisone (CORTEF) 20 MG tablet Take 2 tablets (40 mg total) by mouth 2 (two) times daily for 14 days. 10/25/18 11/08/18  Desiree Hane, MD  Lancets (ONETOUCH DELICA PLUS [id number]) [place]  07/14/18   [provider]  loratadine (CLARITIN) 10 MG tablet Take 10 mg by mouth daily.    [provider]  Magnesium Oxide 400 MG CAPS Take 1 capsule (400 mg total) by mouth 5 (five) times daily. Patient taking differently: Take 400 mg by mouth daily.  09/03/16   Nicholas Lose, MD  montelukast (SINGULAIR) 10 MG tablet Take 10 mg by mouth daily.     [provider]  omeprazole (PRILOSEC) 40 MG capsule Take 40 mg by mouth 2 (two) times daily. 08/25/18   [provider]  ondansetron (ZOFRAN-ODT) 8 MG disintegrating tablet Take 8 mg by mouth every 8 (eight) hours as needed for nausea or vomiting.    [provider]  polyvinyl alcohol (LIQUIFILM TEARS) 1.4 % ophthalmic solution Place 1 drop into both eyes as needed for dry eyes.    [provider]  posaconazole (NOXAFIL) 100 MG TBEC delayed-release tablet Take 300 mg by mouth daily. 06/03/16   [provider]  senna-docusate (SENOKOT-S) 8.6-50 MG tablet Take 2 tablets by mouth at bedtime.     [provider]    Family History Family History  Problem Relation Age of Onset  . Breast cancer Mother   . CVA Mother        quit taking HTN meds  . Alzheimer's disease Father        late 50s. mid 76s  . Asperger's syndrome Son     Social  History Social History   Tobacco Use  . Smoking status: Never Smoker  . Smokeless tobacco: Never Used  Substance Use Topics  . Alcohol use: Yes    Alcohol/week: 3.0 standard drinks    Types: 3 Standard drinks or equivalent per week  . Drug use: No     Allergies   Patient has no known allergies.   Review of Systems Review of Systems  Constitutional: Negative for fever.  Gastrointestinal: Positive for vomiting. Negative for abdominal pain.  All other systems reviewed and are negative.    Physical Exam Updated Vital Signs BP 134/85   Pulse (!) 54   Temp 97.9 F (36.6 C) (Oral)   Resp 15   Ht 5\' 10"  (1.778 m)   Wt 102.1 kg   SpO2 99%   BMI 32.28 kg/m   Physical Exam Vitals signs and nursing note reviewed.  Constitutional:      General: He is not in acute distress.  Appearance: Normal appearance. He is well-developed.  HENT:     Head: Normocephalic and atraumatic.  Eyes:     Conjunctiva/sclera: Conjunctivae normal.     Pupils: Pupils are equal, round, and reactive to light.  Neck:     Musculoskeletal: Normal range of motion and neck supple.  Cardiovascular:     Rate and Rhythm: Normal rate and regular rhythm.     Heart sounds: Normal heart sounds.  Pulmonary:     Effort: Pulmonary effort is normal. No respiratory distress.     Breath sounds: Normal breath sounds.  Abdominal:     General: There is no distension.     Palpations: Abdomen is soft.     Tenderness: There is no abdominal tenderness.  Musculoskeletal: Normal range of motion.        General: No deformity.  Skin:    General: Skin is warm and dry.  Neurological:     Mental Status: He is alert and oriented to person, place, and time.      ED Treatments / Results  Labs (all labs ordered are listed, but only abnormal results are displayed) Labs Reviewed  CBC - Abnormal; Notable for the following components:      Result Value   RDW 17.0 (*)    All other components within normal limits   LIPASE, BLOOD  COMPREHENSIVE METABOLIC PANEL  URINALYSIS, ROUTINE W REFLEX MICROSCOPIC    EKG EKG Interpretation  Date/Time:  Friday Nov 07 2018 21:30:00 EDT Ventricular Rate:  52 PR Interval:    QRS Duration: 84 QT Interval:  438 QTC Calculation: 408 R Axis:   72 Text Interpretation:  Sinus rhythm Low voltage, extremity and precordial leads Confirmed by Dene Gentry [phone]) on 11/07/2018 9:38:37 PM   Radiology No results found.  Procedures Procedures (including critical care time)  Medications Ordered in ED Medications  sodium chloride flush (NS) 0.9 % injection 3 mL (3 mLs Intravenous Given 11/07/18 2127)  ondansetron (ZOFRAN) injection 4 mg (4 mg Intravenous Given 11/07/18 2127)     Initial Impression / Assessment and Plan / ED Course  I have reviewed the triage vital signs and the nursing notes.  Pertinent labs & imaging results that were available during my care of the patient were reviewed by me and considered in my medical decision making (see chart for details).        MDM  Screen complete  DELMA VILLALVA was evaluated in Emergency Department on 11/07/2018 for the symptoms described in the history of present illness. He was evaluated in the context of the global COVID-19 pandemic, which necessitated consideration that the patient might be at risk for infection with the SARS-CoV-2 virus that causes COVID-19. Institutional protocols and algorithms that pertain to the evaluation of patients at risk for COVID-19 are in a state of rapid change based on information released by regulatory bodies including the CDC and federal and state organizations. These policies and algorithms were followed during the patient's care in the ED.  Patient is presenting for evaluation of reported nausea vomiting thought to be secondary to adrenal insufficiency.  Patient is requesting transfer to [facility] for continuity of care.  Patient is clinically stable and appropriate for transfer.   The transfer center contacted.  Dr. Barbaraann Faster accepts the patient to [facility] on an ED to ED transfer.  Patient is to be screened for possible COVID upon arrival to the [facility] ED with their rapid test.  Patient can be transported to 9200 for further treatment.  Final Clinical Impressions(s) / ED Diagnoses   Final diagnoses:  Nausea  Nausea and vomiting, intractability of vomiting not specified, unspecified vomiting type    ED Discharge Orders    None       Valarie Merino, MD 11/07/18 2216

## 2018-11-07 NOTE — ED Triage Notes (Signed)
Pt from home with c/o nausea, emesis, and reports he may be having an adrenal crisis. PT reports that he had 50mg  IV of steroids and 30mg  oral today. PT reports multiple episodes of emesis that started around 6pm.

## 2018-11-07 NOTE — Progress Notes (Signed)
Patient states he has a "new rash" to ankles and lower legs. Does not itch. Not raised. Md notified.Eulas Post, RN

## 2018-11-07 NOTE — ED Notes (Signed)
Requested patient to urinate. 

## 2018-11-07 NOTE — Discharge Summary (Signed)
Physician Discharge Summary  Brandon [name]:[id number] DOB: 1951-01-27 DOA: 11/05/2018  PCP: [name], MD  Admit date: 11/05/2018 Discharge date: 11/07/2018  Admitted From: home  Disposition:  Home   Recommendations for Outpatient Follow-up:  1. Follow-up with your transplant team at [facility] as scheduled for next week.  Home Health: Not applicable Equipment/Devices: Not applicable  Discharge Condition: Stable CODE STATUS: Full code Diet recommendation:  Regular diet Brief/Interim Summary: 68 year old gentleman with extensive medical history significant for MDS status post bone marrow transplant, chronic steroids for GVHD, adrenal insufficiency with episodes of adrenal crisis requiring hospitalizations.  Recent multiple hospitalizations.  Most recently admitted 2 weeks ago for adrenal crisis in the setting of pneumonia treated with antibiotics and discharged on increased dose of steroids.  Patient presented back to the emergency room with nausea, vomiting that was abrupt onset.  He was taking 30 mg of hydrocortisone a day lately.    Discharge Diagnoses:  Principal Problem:   Adrenal crisis [facility]) Active Problems:   Essential hypertension   History of stem cell transplant ([place])   GVHD (graft versus host disease) ([place])   Adrenocortical insufficiency (HCC)  Adrenal crisis: Symptoms improved with injectable hydrocortisone. Blood pressure responded well to fluid resuscitation. UA negative, COVID-19 negative CT scan chest abdomen pelvis was essentially normal.  CT chest showed improving airspace disease.  Afebrile.  WBC count, creatinine and lactic acid all improved with hydration.  Blood cultures negative. Patient was monitored in the hospital.  No evidence of infection.  Antibiotics were not used.  Has difficult to manage conditions with chronic long-term steroid use. As per his transplant team recommendation, patient will continue Solu-Cortef 40 mg twice a day and follow-up  scheduled for next week.  He was advised to report for any abnormal symptoms. Patient had noted few red streaks with rashes along the socks elastic line in the hospital, no rashes elsewhere in the body.  These look fairly benign, I have asked him to look closely for any of these problems.  Patient is walking in the hallway with no symptoms to go home.    Discharge Instructions  Discharge Instructions    Call MD for:  difficulty breathing, headache or visual disturbances   Complete by:  As directed    Call MD for:  extreme fatigue   Complete by:  As directed    Call MD for:  persistant dizziness or light-headedness   Complete by:  As directed    Call MD for:  persistant nausea and vomiting   Complete by:  As directed    Diet - low sodium heart healthy   Complete by:  As directed    Discharge instructions   Complete by:  As directed    Stay on 40 mg BID of steroids until you have a follow up. Start taking blood pressure medicine if your BP > 120 at home   Increase activity slowly   Complete by:  As directed      Allergies as of 11/07/2018   No Known Allergies     Medication List    TAKE these medications   acyclovir 400 MG tablet Commonly known as:  ZOVIRAX Take 400 mg by mouth 2 (two) times daily.   albuterol 108 (90 Base) MCG/ACT inhaler Commonly known as:  VENTOLIN HFA Inhale 1-2 puffs into the lungs every 6 (six) hours as needed for wheezing or shortness of breath.   amLODipine 2.5 MG tablet Commonly known as:  NORVASC Take 1 tablet (  2.5 mg total) by mouth daily.   aspirin EC 81 MG tablet Take 81 mg by mouth at bedtime.   atenolol 50 MG tablet Commonly known as:  TENORMIN Take 0.5 tablets (25 mg total) by mouth every evening. What changed:  how much to take   azithromycin 250 MG tablet Commonly known as:  ZITHROMAX Take 250 mg by mouth every Monday, Wednesday, and Friday.   Claritin 10 MG tablet Generic drug:  loratadine Take 10 mg by mouth daily.    fluticasone 220 MCG/ACT inhaler Commonly known as:  FLOVENT HFA Inhale 2 puffs into the lungs 2 (two) times daily.   fluticasone 50 MCG/ACT nasal spray Commonly known as:  FLONASE Place 2 sprays into both nostrils daily as needed for allergies or rhinitis.   hydrocortisone 20 MG tablet Commonly known as:  CORTEF Take 2 tablets (40 mg total) by mouth 2 (two) times daily for 14 days.   Magnesium Oxide 400 MG Caps Take 1 capsule (400 mg total) by mouth 5 (five) times daily. What changed:  when to take this   montelukast 10 MG tablet Commonly known as:  SINGULAIR Take 10 mg by mouth daily.   omeprazole 40 MG capsule Commonly known as:  PRILOSEC Take 40 mg by mouth 2 (two) times daily.   ondansetron 8 MG disintegrating tablet Commonly known as:  ZOFRAN-ODT Take 8 mg by mouth every 8 (eight) hours as needed for nausea or vomiting.   OneTouch Delica Plus [id number] Misc   polyvinyl alcohol 1.4 % ophthalmic solution Commonly known as:  LIQUIFILM TEARS Place 1 drop into both eyes as needed for dry eyes.   posaconazole 100 MG Tbec delayed-release tablet Commonly known as:  NOXAFIL Take 300 mg by mouth daily.   senna-docusate 8.6-50 MG tablet Commonly known as:  Senokot-S Take 2 tablets by mouth at bedtime.   Vitamin D 50 MCG (2000 UT) Caps Take 2,000 Units by mouth at bedtime.       No Known Allergies  Consultations:  None.   Procedures/Studies: Ct Abdomen Pelvis Wo Contrast  Result Date: 11/05/2018 CLINICAL DATA:  Nausea and vomiting. Shortness of breath. Possible adrenal crisis. EXAM: CT CHEST, ABDOMEN AND PELVIS WITHOUT CONTRAST TECHNIQUE: Multidetector CT imaging of the chest, abdomen and pelvis was performed following the standard protocol without IV contrast. COMPARISON:  10/21/2018 FINDINGS: CT CHEST FINDINGS Cardiovascular: Heart size is normal. No pericardial fluid. Minimal coronary artery calcification. Mild aortic atherosclerosis. Mediastinum/Nodes: No mass  or adenopathy. Lungs/Pleura: Improving patchy infiltrate in the posterior left upper lobe. There is not yet complete clearing, but the trend is favorable. Atelectasis of the medial left lower lobe adjacent to the tortuous aorta is unchanged. Few other areas pulmonary scarring are stable. Evidence of pulmonary edema. No new infiltrate. No pleural effusion. Musculoskeletal: Ordinary spondylosis. CT ABDOMEN PELVIS FINDINGS Hepatobiliary: Liver appears normal without contrast. No calcified gallstones. Pancreas: Normal Spleen: Normal Adrenals/Urinary Tract: Adrenal glands are normal. Kidneys are normal. No evidence of stone disease. No bladder abnormality. Stomach/Bowel: Evidence of ileus or obstruction. Sigmoid diverticulosis without evidence of diverticulitis presently. Vascular/Lymphatic: Aortic atherosclerotic calcification, mild. No aneurysm. IVC is normal. No retroperitoneal adenopathy. Lymphadenopathy at the gastrohepatic ligament is again seen is stable over time. Reproductive: Normal Other: No free fluid or air. Musculoskeletal: Small left inguinal hernia containing only fat. Ordinary degenerative changes affect the spine, including degenerative anterolisthesis at L4-5. IMPRESSION: No acute finding by CT. Subtotal resolution the patchy abnormality previously seen in the posterior left upper lobe, consistent  with resolving pneumonia. Similar appearance chronic atelectasis of the medial left lower lobe adjacent to the aorta. No acute or significant abdominal or pelvic finding. Aortic atherosclerosis. Sigmoid diverticulosis. Chronic spinal degenerative changes including degenerative anterolisthesis L4-5. Electronically Signed   By: Nelson Chimes M.D.   On: 11/05/2018 21:36   Ct Abdomen Pelvis Wo Contrast  Result Date: 10/21/2018 CLINICAL DATA:  Fever with chills, nausea, diarrhea. EXAM: CT CHEST, ABDOMEN AND PELVIS WITHOUT CONTRAST TECHNIQUE: Multidetector CT imaging of the chest, abdomen and pelvis was  performed following the standard protocol without IV contrast. COMPARISON:  Abdomen and pelvis CT 07/15/2017. FINDINGS: CT CHEST FINDINGS Cardiovascular: The heart size is normal. No substantial pericardial effusion. Atherosclerotic calcification is noted in the wall of the thoracic aorta. Mediastinum/Nodes: No mediastinal lymphadenopathy. No evidence for gross hilar lymphadenopathy although assessment is limited by the lack of intravenous contrast on today's study. The esophagus has normal imaging features. There is no axillary lymphadenopathy. Lungs/Pleura: The central tracheobronchial airways are patent. Subsegmental atelectasis noted right lower lobe. 4 mm perifissural left lower lobe nodule visible on 102/4. Patchy airspace disease noted posterior left upper lobe with nodular component measuring 11 mm. Probable parahilar atelectasis. No pleural effusion. Musculoskeletal: No worrisome lytic or sclerotic osseous abnormality. Bilateral gynecomastia evident. CT ABDOMEN PELVIS FINDINGS Hepatobiliary: The liver shows diffusely decreased attenuation suggesting steatosis. There is no evidence for gallstones, gallbladder wall thickening, or pericholecystic fluid. No intrahepatic or extrahepatic biliary dilation. Pancreas: No focal mass lesion. No dilatation of the main duct. No intraparenchymal cyst. No peripancreatic edema. Spleen: No splenomegaly. No focal mass lesion. Adrenals/Urinary Tract: No adrenal nodule or mass. Kidneys unremarkable. No evidence for hydroureter. Tiny gas bubble noted in the urinary bladder. Stomach/Bowel: Stomach is unremarkable. No gastric wall thickening. No evidence of outlet obstruction. Duodenum is normally positioned as is the ligament of Treitz. No small bowel wall thickening. No small bowel dilatation. The terminal ileum is normal. The appendix is normal. No gross colonic mass. No colonic wall thickening. Diverticular changes are noted in the left colon without evidence of  diverticulitis. Vascular/Lymphatic: There is abdominal aortic atherosclerosis without aneurysm. Mild lymphadenopathy in the gastrohepatic ligament, measuring 12 mm short axis, is stable in the more than 1 year interval since the prior study. No hepato duodenal ligament lymphadenopathy. No retroperitoneal lymphadenopathy. No pelvic sidewall lymphadenopathy. Reproductive: The prostate gland and seminal vesicles are unremarkable. Other: No intraperitoneal free fluid. Musculoskeletal: Small left groin hernia contains only fat. No worrisome lytic or sclerotic osseous abnormality. IMPRESSION: 1. Patchy and nodular airspace disease posterior left upper lobe. This is most likely infectious/inflammatory in etiology but given the nodular component, follow-up recommended to ensure complete resolution. 2. Presumed para-aortic atelectasis in the medial left lower lobe could also be reassessed at the time of follow-up. 3. Tiny gas bubble noted in the bladder lumen. Likely related to recent instrumentation, but in the absence of instrumentation, bladder infection could produce this appearance. 4. 4 mm perifissural left lower lobe pulmonary nodule. No follow-up needed if patient is low-risk. Non-contrast chest CT can be considered in 12 months if patient is high-risk. This recommendation follows the consensus statement: Guidelines for Management of Incidental Pulmonary Nodules Detected on CT Images: From the Fleischner Society 2017; Radiology 2017; 284:228-243. 5. Prominent gastrohepatic ligament lymph node, stable since 07/15/2017, likely reactive. 6. Aortic Atherosclerois (ICD10-170.0) Electronically Signed   By: Misty Stanley M.D.   On: 10/21/2018 17:53   Ct Chest Wo Contrast  Result Date: 11/05/2018 CLINICAL DATA:  Nausea  and vomiting. Shortness of breath. Possible adrenal crisis. EXAM: CT CHEST, ABDOMEN AND PELVIS WITHOUT CONTRAST TECHNIQUE: Multidetector CT imaging of the chest, abdomen and pelvis was performed following  the standard protocol without IV contrast. COMPARISON:  10/21/2018 FINDINGS: CT CHEST FINDINGS Cardiovascular: Heart size is normal. No pericardial fluid. Minimal coronary artery calcification. Mild aortic atherosclerosis. Mediastinum/Nodes: No mass or adenopathy. Lungs/Pleura: Improving patchy infiltrate in the posterior left upper lobe. There is not yet complete clearing, but the trend is favorable. Atelectasis of the medial left lower lobe adjacent to the tortuous aorta is unchanged. Few other areas pulmonary scarring are stable. Evidence of pulmonary edema. No new infiltrate. No pleural effusion. Musculoskeletal: Ordinary spondylosis. CT ABDOMEN PELVIS FINDINGS Hepatobiliary: Liver appears normal without contrast. No calcified gallstones. Pancreas: Normal Spleen: Normal Adrenals/Urinary Tract: Adrenal glands are normal. Kidneys are normal. No evidence of stone disease. No bladder abnormality. Stomach/Bowel: Evidence of ileus or obstruction. Sigmoid diverticulosis without evidence of diverticulitis presently. Vascular/Lymphatic: Aortic atherosclerotic calcification, mild. No aneurysm. IVC is normal. No retroperitoneal adenopathy. Lymphadenopathy at the gastrohepatic ligament is again seen is stable over time. Reproductive: Normal Other: No free fluid or air. Musculoskeletal: Small left inguinal hernia containing only fat. Ordinary degenerative changes affect the spine, including degenerative anterolisthesis at L4-5. IMPRESSION: No acute finding by CT. Subtotal resolution the patchy abnormality previously seen in the posterior left upper lobe, consistent with resolving pneumonia. Similar appearance chronic atelectasis of the medial left lower lobe adjacent to the aorta. No acute or significant abdominal or pelvic finding. Aortic atherosclerosis. Sigmoid diverticulosis. Chronic spinal degenerative changes including degenerative anterolisthesis L4-5. Electronically Signed   By: Nelson Chimes M.D.   On: 11/05/2018  21:36   Ct Chest Wo Contrast  Result Date: 10/21/2018 CLINICAL DATA:  Fever with chills, nausea, diarrhea. EXAM: CT CHEST, ABDOMEN AND PELVIS WITHOUT CONTRAST TECHNIQUE: Multidetector CT imaging of the chest, abdomen and pelvis was performed following the standard protocol without IV contrast. COMPARISON:  Abdomen and pelvis CT 07/15/2017. FINDINGS: CT CHEST FINDINGS Cardiovascular: The heart size is normal. No substantial pericardial effusion. Atherosclerotic calcification is noted in the wall of the thoracic aorta. Mediastinum/Nodes: No mediastinal lymphadenopathy. No evidence for gross hilar lymphadenopathy although assessment is limited by the lack of intravenous contrast on today's study. The esophagus has normal imaging features. There is no axillary lymphadenopathy. Lungs/Pleura: The central tracheobronchial airways are patent. Subsegmental atelectasis noted right lower lobe. 4 mm perifissural left lower lobe nodule visible on 102/4. Patchy airspace disease noted posterior left upper lobe with nodular component measuring 11 mm. Probable parahilar atelectasis. No pleural effusion. Musculoskeletal: No worrisome lytic or sclerotic osseous abnormality. Bilateral gynecomastia evident. CT ABDOMEN PELVIS FINDINGS Hepatobiliary: The liver shows diffusely decreased attenuation suggesting steatosis. There is no evidence for gallstones, gallbladder wall thickening, or pericholecystic fluid. No intrahepatic or extrahepatic biliary dilation. Pancreas: No focal mass lesion. No dilatation of the main duct. No intraparenchymal cyst. No peripancreatic edema. Spleen: No splenomegaly. No focal mass lesion. Adrenals/Urinary Tract: No adrenal nodule or mass. Kidneys unremarkable. No evidence for hydroureter. Tiny gas bubble noted in the urinary bladder. Stomach/Bowel: Stomach is unremarkable. No gastric wall thickening. No evidence of outlet obstruction. Duodenum is normally positioned as is the ligament of Treitz. No small  bowel wall thickening. No small bowel dilatation. The terminal ileum is normal. The appendix is normal. No gross colonic mass. No colonic wall thickening. Diverticular changes are noted in the left colon without evidence of diverticulitis. Vascular/Lymphatic: There is abdominal aortic atherosclerosis without aneurysm.  Mild lymphadenopathy in the gastrohepatic ligament, measuring 12 mm short axis, is stable in the more than 1 year interval since the prior study. No hepato duodenal ligament lymphadenopathy. No retroperitoneal lymphadenopathy. No pelvic sidewall lymphadenopathy. Reproductive: The prostate gland and seminal vesicles are unremarkable. Other: No intraperitoneal free fluid. Musculoskeletal: Small left groin hernia contains only fat. No worrisome lytic or sclerotic osseous abnormality. IMPRESSION: 1. Patchy and nodular airspace disease posterior left upper lobe. This is most likely infectious/inflammatory in etiology but given the nodular component, follow-up recommended to ensure complete resolution. 2. Presumed para-aortic atelectasis in the medial left lower lobe could also be reassessed at the time of follow-up. 3. Tiny gas bubble noted in the bladder lumen. Likely related to recent instrumentation, but in the absence of instrumentation, bladder infection could produce this appearance. 4. 4 mm perifissural left lower lobe pulmonary nodule. No follow-up needed if patient is low-risk. Non-contrast chest CT can be considered in 12 months if patient is high-risk. This recommendation follows the consensus statement: Guidelines for Management of Incidental Pulmonary Nodules Detected on CT Images: From the Fleischner Society 2017; Radiology 2017; 284:228-243. 5. Prominent gastrohepatic ligament lymph node, stable since 07/15/2017, likely reactive. 6. Aortic Atherosclerois (ICD10-170.0) Electronically Signed   By: Misty Stanley M.D.   On: 10/21/2018 17:53   Dg Chest Port 1 View  Result Date:  11/05/2018 CLINICAL DATA:  Shortness of breath EXAM: PORTABLE CHEST 1 VIEW COMPARISON:  10/21/2018 FINDINGS: The heart size and mediastinal contours are within normal limits. Both lungs are clear. The visualized skeletal structures are unremarkable. IMPRESSION: No active disease. Electronically Signed   By: Ulyses Jarred M.D.   On: 11/05/2018 19:21   Dg Chest Portable 1 View  Result Date: 10/21/2018 CLINICAL DATA:  68 year old male with a history of nausea chills EXAM: PORTABLE CHEST 1 VIEW COMPARISON:  07/15/2017, 07/06/2017 FINDINGS: Cardiomediastinal silhouette unchanged in size and contour. No pneumothorax. No pleural effusion. No confluent airspace disease. Low lung volumes with coarsened interstitial markings at the lung bases, similar prior. No displaced fracture. IMPRESSION: Chronic changes without evidence of acute cardiopulmonary disease Electronically Signed   By: Corrie Mckusick D.O.   On: 10/21/2018 10:57      Subjective: Patient seen and examined.  Asymptomatic overnight.  He had discussed with his transplant team.  He is going home.   Discharge Exam: Vitals:   11/07/18 0518 11/07/18 0821  BP: 129/80   Pulse: (!) 52   Resp: 12   Temp: 97.7 F (36.5 C)   SpO2: 99% 97%   Vitals:   11/06/18 2049 11/07/18 0126 11/07/18 0518 11/07/18 0821  BP: 135/82  129/80   Pulse: 63  (!) 52   Resp: 16  12   Temp: 98.4 F (36.9 C) 97.8 F (36.6 C) 97.7 F (36.5 C)   TempSrc: Oral Oral Oral   SpO2: 97%  99% 97%  Weight:      Height:        General: Pt is alert, awake, not in acute distress, walking in the hallway. Cardiovascular: RRR, S1/S2 +, no rubs, no gallops Respiratory: CTA bilaterally, no wheezing, no rhonchi Abdominal: Soft, NT, ND, bowel sounds + Extremities:  2+ bilateral pedal edema. Blanchable skin rash along the elastic band of the socks.  No induration.  No other skin rashes.    The results of significant diagnostics from this hospitalization (including imaging,  microbiology, ancillary and laboratory) are listed below for reference.     Microbiology: Recent Results (from the  past 240 hour(s))  SARS Coronavirus 2 (CEPHEID - Performed in [place] hospital lab), Hosp Order     Status: None   Collection Time: 11/05/18  7:37 PM  Result Value Ref Range Status   SARS Coronavirus 2 NEGATIVE NEGATIVE Final    Comment: (NOTE) If result is NEGATIVE SARS-CoV-2 target nucleic acids are NOT DETECTED. The SARS-CoV-2 RNA is generally detectable in upper and lower  respiratory specimens during the acute phase of infection. The lowest  concentration of SARS-CoV-2 viral copies this assay can detect is 250  copies / mL. A negative result does not preclude SARS-CoV-2 infection  and should not be used as the sole basis for treatment or other  patient management decisions.  A negative result may occur with  improper specimen collection / handling, submission of specimen other  than nasopharyngeal swab, presence of viral mutation(s) within the  areas targeted by this assay, and inadequate number of viral copies  (<250 copies / mL). A negative result must be combined with clinical  observations, patient history, and epidemiological information. If result is POSITIVE SARS-CoV-2 target nucleic acids are DETECTED. The SARS-CoV-2 RNA is generally detectable in upper and lower  respiratory specimens dur ing the acute phase of infection.  Positive  results are indicative of active infection with SARS-CoV-2.  Clinical  correlation with patient history and other diagnostic information is  necessary to determine patient infection status.  Positive results do  not rule out bacterial infection or co-infection with other viruses. If result is PRESUMPTIVE POSTIVE SARS-CoV-2 nucleic acids MAY BE PRESENT.   A presumptive positive result was obtained on the submitted specimen  and confirmed on repeat testing.  While 2019 novel coronavirus  (SARS-CoV-2) nucleic acids may be  present in the submitted sample  additional confirmatory testing may be necessary for epidemiological  and / or clinical management purposes  to differentiate between  SARS-CoV-2 and other Sarbecovirus currently known to infect humans.  If clinically indicated additional testing with an alternate test  methodology [phone]) is advised. The SARS-CoV-2 RNA is generally  detectable in upper and lower respiratory sp ecimens during the acute  phase of infection. The expected result is Negative. Fact Sheet for Patients:  StrictlyIdeas.no Fact Sheet for Healthcare Providers: BankingDealers.co.za This test is not yet approved or cleared by the [place] FDA and has been authorized for detection and/or diagnosis of SARS-CoV-2 by FDA under an Emergency Use Authorization (EUA).  This EUA will remain in effect (meaning this test can be used) for the duration of the COVID-19 declaration under Section 564(b)(1) of the Act, 21 U.S.C. section 360bbb-3(b)(1), unless the authorization is terminated or revoked sooner. Performed at [facility], [place] [address] [place], [place] [address]   Culture, blood (Routine X 2) w Reflex to ID Panel     Status: None (Preliminary result)   Collection Time: 11/05/18  8:22 PM  Result Value Ref Range Status   Specimen Description   Final    BLOOD LEFT ANTECUBITAL Performed at [place] [address], [place], [place] [address]    Special Requests   Final    BOTTLES DRAWN AEROBIC AND ANAEROBIC Blood Culture adequate volume Performed at [place] [address] [place], [place] [address]    Culture   Final    NO GROWTH 2 DAYS Performed at [place] [address] [place], [place] [address]    Report Status PENDING  Incomplete  Culture, blood (  Routine X 2) w Reflex to ID Panel     Status: None (Preliminary result)   Collection Time:  11/06/18 12:49 AM  Result Value Ref Range Status   Specimen Description   Final    BLOOD RIGHT HAND Performed at [facility], [place] [address] [place], [place] [address]    Special Requests   Final    BOTTLES DRAWN AEROBIC AND ANAEROBIC Blood Culture adequate volume Performed at [place] [address], [place], [place] [address]    Culture   Final    NO GROWTH 1 DAY Performed at [facility], [place] [address] [place], [place] [address]    Report Status PENDING  Incomplete     Labs: BNP (last 3 results) No results for input(s): BNP in the last 8760 hours. Basic Metabolic Panel: Recent Labs  Lab 11/05/18 1904 11/06/18 0049 11/07/18 0454  NA 138 139 138  K 3.6 4.0 3.7  CL 104 111 110  CO2 21* 20* 22  GLUCOSE 115* 166* 119*  BUN 18 24* 12  CREATININE 1.32* 1.20 0.81  CALCIUM 9.3 7.6* 8.0*   Liver Function Tests: Recent Labs  Lab 11/05/18 1904  AST 42*  ALT 39  ALKPHOS 122  BILITOT 0.9  PROT 7.3  ALBUMIN 4.1   Recent Labs  Lab 11/05/18 1904  LIPASE 166*   No results for input(s): AMMONIA in the last 168 hours. CBC: Recent Labs  Lab 11/05/18 1904 11/06/18 0049 11/07/18 0454  WBC 18.3* 11.5* 6.8  NEUTROABS  --   --  4.5  HGB 16.8 12.7* 11.2*  HCT 50.7 38.5* 34.4*  MCV 94.8 95.3 95.3  PLT 374 281 207   Cardiac Enzymes: Recent Labs  Lab 11/05/18 1904  TROPONINI <0.03   BNP: Invalid input(s): POCBNP CBG: No results for input(s): GLUCAP in the last 168 hours. D-Dimer No results for input(s): DDIMER in the last 72 hours. Hgb A1c No results for input(s): HGBA1C in the last 72 hours. Lipid Profile No results for input(s): CHOL, HDL, LDLCALC, TRIG, CHOLHDL, LDLDIRECT in the last 72 hours. Thyroid function studies No results for input(s): TSH, T4TOTAL, T3FREE, THYROIDAB in the last 72 hours.  Invalid input(s): FREET3 Anemia work up No results for input(s): VITAMINB12, FOLATE, FERRITIN, TIBC, IRON, RETICCTPCT in  the last 72 hours. Urinalysis    Component Value Date/Time   COLORURINE YELLOW 11/05/2018 1904   APPEARANCEUR CLEAR 11/05/2018 1904   LABSPEC 1.015 11/05/2018 1904   PHURINE 5.0 11/05/2018 1904   GLUCOSEU NEGATIVE 11/05/2018 1904   GLUCOSEU NEGATIVE 12/23/2009 0742   HGBUR NEGATIVE 11/05/2018 1904   HGBUR negative 01/24/2007 0810   BILIRUBINUR NEGATIVE 11/05/2018 1904   BILIRUBINUR negative 08/10/2014 1057   BILIRUBINUR n 02/11/2013 1059   KETONESUR 5 (A) 11/05/2018 1904   PROTEINUR NEGATIVE 11/05/2018 1904   UROBILINOGEN 0.2 08/10/2014 1057   UROBILINOGEN 0.2 12/23/2009 0742   NITRITE NEGATIVE 11/05/2018 1904   LEUKOCYTESUR NEGATIVE 11/05/2018 1904   Sepsis Labs Invalid input(s): PROCALCITONIN,  WBC,  LACTICIDVEN Microbiology Recent Results (from the past 240 hour(s))  SARS Coronavirus 2 (CEPHEID - Performed in [place] hospital lab), Hosp Order     Status: None   Collection Time: 11/05/18  7:37 PM  Result Value Ref Range Status   SARS Coronavirus 2 NEGATIVE NEGATIVE Final    Comment: (NOTE) If result is NEGATIVE SARS-CoV-2 target nucleic acids are NOT DETECTED. The SARS-CoV-2 RNA is generally detectable in upper and lower  respiratory specimens during the acute phase of infection. The lowest  concentration of SARS-CoV-2 viral copies this assay can detect is 250  copies / mL. A negative result does not preclude SARS-CoV-2 infection  and should not be used as the sole basis for treatment or other  patient management decisions.  A negative result may occur with  improper specimen collection / handling, submission of specimen other  than nasopharyngeal swab, presence of viral mutation(s) within the  areas targeted by this assay, and inadequate number of viral copies  (<250 copies / mL). A negative result must be combined with clinical  observations, patient history, and epidemiological information. If result is POSITIVE SARS-CoV-2 target nucleic acids are DETECTED. The  SARS-CoV-2 RNA is generally detectable in upper and lower  respiratory specimens dur ing the acute phase of infection.  Positive  results are indicative of active infection with SARS-CoV-2.  Clinical  correlation with patient history and other diagnostic information is  necessary to determine patient infection status.  Positive results do  not rule out bacterial infection or co-infection with other viruses. If result is PRESUMPTIVE POSTIVE SARS-CoV-2 nucleic acids MAY BE PRESENT.   A presumptive positive result was obtained on the submitted specimen  and confirmed on repeat testing.  While 2019 novel coronavirus  (SARS-CoV-2) nucleic acids may be present in the submitted sample  additional confirmatory testing may be necessary for epidemiological  and / or clinical management purposes  to differentiate between  SARS-CoV-2 and other Sarbecovirus currently known to infect humans.  If clinically indicated additional testing with an alternate test  methodology [phone]) is advised. The SARS-CoV-2 RNA is generally  detectable in upper and lower respiratory sp ecimens during the acute  phase of infection. The expected result is Negative. Fact Sheet for Patients:  StrictlyIdeas.no Fact Sheet for Healthcare Providers: BankingDealers.co.za This test is not yet approved or cleared by the [place] FDA and has been authorized for detection and/or diagnosis of SARS-CoV-2 by FDA under an Emergency Use Authorization (EUA).  This EUA will remain in effect (meaning this test can be used) for the duration of the COVID-19 declaration under Section 564(b)(1) of the Act, 21 U.S.C. section 360bbb-3(b)(1), unless the authorization is terminated or revoked sooner. Performed at [facility], [place] [address] [place], [place] [address]   Culture, blood (Routine X 2) w Reflex to ID Panel     Status: None (Preliminary result)   Collection  Time: 11/05/18  8:22 PM  Result Value Ref Range Status   Specimen Description   Final    BLOOD LEFT ANTECUBITAL Performed at [place] [address] [place], [place] [address]    Special Requests   Final    BOTTLES DRAWN AEROBIC AND ANAEROBIC Blood Culture adequate volume Performed at [facility] [address], [place], [place] [address]    Culture   Final    NO GROWTH 2 DAYS Performed at [facility] [address], [place], [place] [address]    Report Status PENDING  Incomplete  Culture, blood (Routine X 2) w Reflex to ID Panel     Status: None (Preliminary result)   Collection Time: 11/06/18 12:49 AM  Result Value Ref Range Status   Specimen Description   Final    BLOOD RIGHT HAND Performed at [facility], [place] [address] [place],  [address]    Special Requests   Final    BOTTLES DRAWN AEROBIC AND ANAEROBIC  Blood Culture adequate volume Performed at [facility] [address] [place], [place] [address]    Culture   Final    NO GROWTH 1 DAY Performed at [facility], [place] [address] [place], [place] [address]    Report Status PENDING  Incomplete     Time coordinating discharge: 25 minutes  SIGNED:   Barb Merino, MD  [facility] 11/07/2018, 2:48 PM Pager [phone]  If 7PM-7AM, please contact night-coverage [email or web address] Password [email or web address]

## 2018-11-08 NOTE — ED Notes (Signed)
Patient picked up by [facility] to go to [place].

## 2018-11-09 ENCOUNTER — Encounter: Payer: Self-pay | Admitting: Family Medicine

## 2018-11-10 LAB — CULTURE, BLOOD (ROUTINE X 2)
Culture: NO GROWTH
Special Requests: ADEQUATE

## 2018-11-11 LAB — CULTURE, BLOOD (ROUTINE X 2)
Culture: NO GROWTH
Special Requests: ADEQUATE

## 2018-11-19 LAB — HM COLONOSCOPY

## 2018-12-23 ENCOUNTER — Encounter: Payer: Self-pay | Admitting: Family Medicine

## 2019-01-13 ENCOUNTER — Other Ambulatory Visit: Payer: Self-pay | Admitting: *Deleted

## 2019-01-13 DIAGNOSIS — D469 Myelodysplastic syndrome, unspecified: Secondary | ICD-10-CM

## 2019-01-13 DIAGNOSIS — R739 Hyperglycemia, unspecified: Secondary | ICD-10-CM

## 2019-01-13 DIAGNOSIS — E2740 Unspecified adrenocortical insufficiency: Secondary | ICD-10-CM

## 2019-01-13 DIAGNOSIS — E785 Hyperlipidemia, unspecified: Secondary | ICD-10-CM

## 2019-01-13 DIAGNOSIS — T380X5A Adverse effect of glucocorticoids and synthetic analogues, initial encounter: Secondary | ICD-10-CM

## 2019-01-13 NOTE — Progress Notes (Signed)
Renin acti

## 2019-01-14 ENCOUNTER — Other Ambulatory Visit: Payer: Self-pay | Admitting: *Deleted

## 2019-01-14 DIAGNOSIS — E2740 Unspecified adrenocortical insufficiency: Secondary | ICD-10-CM

## 2019-01-14 DIAGNOSIS — E272 Addisonian crisis: Secondary | ICD-10-CM

## 2019-01-15 ENCOUNTER — Inpatient Hospital Stay: Payer: Medicare Other | Attending: Hematology and Oncology

## 2019-01-15 ENCOUNTER — Other Ambulatory Visit: Payer: Self-pay

## 2019-01-15 DIAGNOSIS — D4621 Refractory anemia with excess of blasts 1: Secondary | ICD-10-CM | POA: Insufficient documentation

## 2019-01-15 DIAGNOSIS — D469 Myelodysplastic syndrome, unspecified: Secondary | ICD-10-CM

## 2019-01-15 DIAGNOSIS — Z79899 Other long term (current) drug therapy: Secondary | ICD-10-CM | POA: Insufficient documentation

## 2019-01-15 DIAGNOSIS — T380X5A Adverse effect of glucocorticoids and synthetic analogues, initial encounter: Secondary | ICD-10-CM

## 2019-01-15 DIAGNOSIS — E2740 Unspecified adrenocortical insufficiency: Secondary | ICD-10-CM

## 2019-01-15 DIAGNOSIS — R739 Hyperglycemia, unspecified: Secondary | ICD-10-CM

## 2019-01-15 LAB — CBC WITH DIFFERENTIAL (CANCER CENTER ONLY)
Abs Immature Granulocytes: 0.49 10*3/uL — ABNORMAL HIGH (ref 0.00–0.07)
Basophils Absolute: 0 10*3/uL (ref 0.0–0.1)
Basophils Relative: 0 %
Eosinophils Absolute: 0 10*3/uL (ref 0.0–0.5)
Eosinophils Relative: 0 %
HCT: 43.5 % (ref 39.0–52.0)
Hemoglobin: 14.7 g/dL (ref 13.0–17.0)
Immature Granulocytes: 5 %
Lymphocytes Relative: 29 %
Lymphs Abs: 2.7 10*3/uL (ref 0.7–4.0)
MCH: 31.2 pg (ref 26.0–34.0)
MCHC: 33.8 g/dL (ref 30.0–36.0)
MCV: 92.4 fL (ref 80.0–100.0)
Monocytes Absolute: 0.8 10*3/uL (ref 0.1–1.0)
Monocytes Relative: 9 %
Neutro Abs: 5.1 10*3/uL (ref 1.7–7.7)
Neutrophils Relative %: 57 %
Platelet Count: 181 10*3/uL (ref 150–400)
RBC: 4.71 MIL/uL (ref 4.22–5.81)
RDW: 16.4 % — ABNORMAL HIGH (ref 11.5–15.5)
WBC Count: 9.1 10*3/uL (ref 4.0–10.5)
nRBC: 0.3 % — ABNORMAL HIGH (ref 0.0–0.2)

## 2019-01-15 LAB — CMP (CANCER CENTER ONLY)
ALT: 78 U/L — ABNORMAL HIGH (ref 0–44)
AST: 26 U/L (ref 15–41)
Albumin: 3.3 g/dL — ABNORMAL LOW (ref 3.5–5.0)
Alkaline Phosphatase: 118 U/L (ref 38–126)
Anion gap: 11 (ref 5–15)
BUN: 19 mg/dL (ref 8–23)
CO2: 26 mmol/L (ref 22–32)
Calcium: 9.2 mg/dL (ref 8.9–10.3)
Chloride: 105 mmol/L (ref 98–111)
Creatinine: 1.03 mg/dL (ref 0.61–1.24)
GFR, Est AFR Am: >60 mL/min (ref >60)
GFR, Estimated: >60 mL/min (ref >60)
Glucose, Bld: 167 mg/dL — ABNORMAL HIGH (ref 70–99)
Potassium: 4.1 mmol/L (ref 3.5–5.1)
Sodium: 142 mmol/L (ref 135–145)
Total Bilirubin: 0.6 mg/dL (ref 0.3–1.2)
Total Protein: 6.2 g/dL — ABNORMAL LOW (ref 6.5–8.1)

## 2019-01-15 LAB — LACTATE DEHYDROGENASE: LDH: 342 U/L — ABNORMAL HIGH (ref 98–192)

## 2019-01-15 LAB — MAGNESIUM: Magnesium: 2 mg/dL (ref 1.7–2.4)

## 2019-01-15 LAB — HEMOGLOBIN A1C
Hgb A1c MFr Bld: 8.7 % — ABNORMAL HIGH (ref 4.8–5.6)
Mean Plasma Glucose: 202.99 mg/dL

## 2019-01-20 LAB — IGG: IgG (Immunoglobin G), Serum: 535 mg/dL — ABNORMAL LOW (ref 603–1613)

## 2019-01-26 NOTE — Progress Notes (Signed)
All labs faxed successfully to Windell Moulding, NP at [phone].

## 2019-02-11 LAB — BASIC METABOLIC PANEL
BUN: 17 (ref 4–21)
Creatinine: 1.1 (ref 0.6–1.3)
Glucose: 166
Potassium: 4.7 (ref 3.4–5.3)
Sodium: 138 (ref 137–147)

## 2019-02-11 LAB — CBC AND DIFFERENTIAL
HCT: 41 (ref 41–53)
Hemoglobin: 13.3 — AB (ref 13.5–17.5)
Neutrophils Absolute: 9
Platelets: 376 (ref 150–399)
WBC: 12.5

## 2019-02-11 LAB — HEPATIC FUNCTION PANEL
ALT: 41 — AB (ref 10–40)
AST: 25 (ref 14–40)
Alkaline Phosphatase: 105 (ref 25–125)
Bilirubin, Total: 0.9

## 2019-02-24 ENCOUNTER — Encounter: Payer: Self-pay | Admitting: Family Medicine

## 2019-02-24 NOTE — Telephone Encounter (Signed)
Spoke to pt and scheduled him for 02/26/2019. Pt stated he could wait until Thursday. Pt still c/o of fatigue and cough. Advise pt to go to the ED pt will wait for appt. Pt was advised if sx worsen or persist to go to the ED.    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 Pt declined visit with any other provider.

## 2019-02-25 ENCOUNTER — Encounter: Payer: Self-pay | Admitting: Family Medicine

## 2019-02-25 ENCOUNTER — Other Ambulatory Visit: Payer: Self-pay

## 2019-02-25 ENCOUNTER — Ambulatory Visit (INDEPENDENT_AMBULATORY_CARE_PROVIDER_SITE_OTHER): Payer: Medicare Other | Admitting: Family Medicine

## 2019-02-25 ENCOUNTER — Other Ambulatory Visit: Payer: Self-pay | Admitting: Emergency Medicine

## 2019-02-25 VITALS — BP 124/79 | HR 89 | Ht 70.0 in

## 2019-02-25 DIAGNOSIS — J189 Pneumonia, unspecified organism: Secondary | ICD-10-CM | POA: Diagnosis not present

## 2019-02-25 DIAGNOSIS — I10 Essential (primary) hypertension: Secondary | ICD-10-CM

## 2019-02-25 DIAGNOSIS — E2740 Unspecified adrenocortical insufficiency: Secondary | ICD-10-CM | POA: Diagnosis not present

## 2019-02-25 DIAGNOSIS — R739 Hyperglycemia, unspecified: Secondary | ICD-10-CM | POA: Diagnosis not present

## 2019-02-25 DIAGNOSIS — R059 Cough, unspecified: Secondary | ICD-10-CM

## 2019-02-25 DIAGNOSIS — R05 Cough: Secondary | ICD-10-CM

## 2019-02-25 DIAGNOSIS — T380X5A Adverse effect of glucocorticoids and synthetic analogues, initial encounter: Secondary | ICD-10-CM

## 2019-02-25 DIAGNOSIS — Z20822 Contact with and (suspected) exposure to covid-19: Secondary | ICD-10-CM

## 2019-02-25 MED ORDER — DOXYCYCLINE HYCLATE 100 MG PO TABS: 100.0000 mg | ORAL_TABLET | Freq: Two times a day (BID) | ORAL | 0 refills | Status: AC

## 2019-02-25 NOTE — Patient Instructions (Signed)
Health Maintenance Due  Topic Date Due  . URINE MICROALBUMIN  08/26/1960  . TETANUS/TDAP  12/06/2015  . INFLUENZA VACCINE  01/24/2019    Depression screen [facility] 2/9 09/24/2018 03/22/2017 11/15/2014  Decreased Interest 0 0 0  Down, Depressed, Hopeless 0 0 0  PHQ - 2 Score 0 0 0

## 2019-02-25 NOTE — Assessment & Plan Note (Addendum)
bp averaging 130s over 80s at home-controlled on amlodipine 10 mg and metoprolol 25 mg twice daily-changed to metoprolol by [facility] and amlodipine was increased by [facility].  Continue to monitor with current medications

## 2019-02-25 NOTE — Assessment & Plan Note (Signed)
S:Fasting cbgs 90s to 110s. Did have one sugar after lunch 244. Had one evening as low as 99 thoughwithout meds A/P: Continue without medication for now-he will let me know if blood pressure trend up

## 2019-02-25 NOTE — Assessment & Plan Note (Signed)
Patient feels like he is doing reasonably well on hydrocortisone 20 mg in the morning and 10 mg in the evening-posaconazole may interfere with hydrocortisone absorption and he states [facility] is considering changing this potentially

## 2019-02-25 NOTE — Progress Notes (Signed)
Phone [phone]   Subjective:  Virtual visit via Video note. Chief complaint: Chief Complaint  Patient presents with  . Cough  . Fatigue   This visit type was conducted due to national recommendations for restrictions regarding the COVID-19 Pandemic (e.g. social distancing).  This format is felt to be most appropriate for this patient at this time balancing risks to patient and risks to population by having him in for in person visit.  No physical exam was performed (except for noted visual exam or audio findings with Telehealth visits).    Our team/I connected with Elisabeth Most at 11:40 AM EDT by a video enabled telemedicine application (doxy.me or caregility through epic) and verified that I am speaking with the correct person using two identifiers.  Location patient: Home-[facility] Location provider: [facility], office Persons participating in the virtual visit:  patient  Our team/I discussed the limitations of evaluation and management by telemedicine and the availability of in person appointments. In light of current covid-19 pandemic, patient also understands that we are trying to protect them by minimizing in office contact if at all possible.  The patient expressed consent for telemedicine visit and agreed to proceed. Patient understands insurance will be billed.   ROS-no fever/chills/loss of taste or smell.  Baseline shortness of breath-not recently worsened.  Past Medical History-  Patient Active Problem List   Diagnosis Date Noted  . Adrenocortical insufficiency ([place]) 08/06/2017    Priority: High  . 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 MDS (myelodysplastic syndrome) ([place]) 12/06/2015    Priority: High  . Steroid-induced hyperglycemia 08/16/2014    Priority: High  . History of DVT (deep vein thrombosis) 09/25/2018    Priority: Medium  . Post chemotherapy Dry eyes due to decreased tear production 08/06/2017    Priority: Medium  . post  chemotherapy Peripheral neuropathy 08/06/2017    Priority: Medium  . BPH associated with nocturia 11/15/2014    Priority: Medium  . Hyperlipidemia 02/04/2007    Priority: Medium  . Essential hypertension 02/04/2007    Priority: Medium  . History of stem cell transplant ([place]) 03/04/2017    Priority: Low  . Leukopenia 11/15/2014    Priority: Low  . Chest pain 08/13/2014    Priority: Low  . GERD (gastroesophageal reflux disease) 05/03/2014    Priority: Low  . Cervical disc disorder with radiculopathy of cervical region 07/14/2010    Priority: Low  . MIXED HEARING LOSS BILATERAL 07/14/2010    Priority: Low  . ERECTILE DYSFUNCTION 02/07/2007    Priority: Low  . Adrenal crisis ([place]) 11/05/2018    Medications- reviewed and updated Current Outpatient Medications  Medication Sig Dispense Refill  . acyclovir (ZOVIRAX) 400 MG tablet Take 400 mg by mouth 2 (two) times daily.    Marland Kitchen albuterol (PROVENTIL HFA;VENTOLIN HFA) 108 (90 Base) MCG/ACT inhaler Inhale 1-2 puffs into the lungs every 6 (six) hours as needed for wheezing or shortness of breath.     Marland Kitchen amLODipine (NORVASC) 5 MG tablet Take 2 tablets by mouth daily.    Marland Kitchen aspirin EC 81 MG tablet Take 81 mg by mouth at bedtime.    . Cholecalciferol (VITAMIN D) 50 MCG (2000 UT) CAPS Take 2,000 Units by mouth at bedtime.     . diphenhydrAMINE HCl (BENADRYL ALLERGY PO) Take by mouth at bedtime as needed.    . famotidine (PEPCID) 20 MG tablet Take 20 mg by mouth daily.    . fluticasone (  FLONASE) 50 MCG/ACT nasal spray Place 2 sprays into both nostrils daily as needed for allergies or rhinitis.    . fluticasone (FLOVENT HFA) 220 MCG/ACT inhaler Inhale 2 puffs into the lungs 2 (two) times daily.     Marland Kitchen guaiFENesin (MUCINEX PO) Take by mouth.    . hydrocortisone (CORTEF) 10 MG tablet Take 10 mg by mouth every evening.    . hydrocortisone (CORTEF) 20 MG tablet Take 20 mg by mouth daily after breakfast.    . hydrocortisone cream 1 % Apply 1 application  topically as needed for itching.    . Lancets (ONETOUCH DELICA PLUS [id number]) MISC     . loratadine (CLARITIN) 10 MG tablet Take 10 mg by mouth daily.    Marland Kitchen LORazepam (ATIVAN) 0.5 MG tablet Take 0.5 mg by mouth every 4 (four) hours as needed for anxiety.    . Magnesium Oxide 400 MG CAPS Take 1 capsule (400 mg total) by mouth 5 (five) times daily. (Patient taking differently: Take 400 mg by mouth daily. )    . Melatonin 3 MG TBDP Take by mouth at bedtime as needed.    . metoprolol tartrate (LOPRESSOR) 25 MG tablet Take 1 tablet by mouth 2 (two) times daily.    . montelukast (SINGULAIR) 10 MG tablet Take 10 mg by mouth daily.     . Multiple Vitamin (MULTI-VITAMIN) tablet Take by mouth.    Marland Kitchen omeprazole (PRILOSEC) 40 MG capsule Take 40 mg by mouth 2 (two) times daily.    . ondansetron (ZOFRAN-ODT) 8 MG disintegrating tablet Take 8 mg by mouth every 8 (eight) hours as needed for nausea or vomiting.    . OxyCODONE HCl, Abuse Deter, (OXAYDO) 5 MG TABA Take 5 mg by mouth every 6 (six) hours as needed.    . Polyethylene Glycol 3350 (MIRALAX PO) Take by mouth.    . polyvinyl alcohol (LIQUIFILM TEARS) 1.4 % ophthalmic solution Place 1 drop into both eyes as needed for dry eyes.    . posaconazole (NOXAFIL) 100 MG TBEC delayed-release tablet Take 300 mg by mouth daily.    Marland Kitchen senna-docusate (SENOKOT-S) 8.6-50 MG tablet Take 2 tablets by mouth at bedtime.     Marland Kitchen doxycycline (VIBRA-TABS) 100 MG tablet Take 1 tablet (100 mg total) by mouth 2 (two) times daily for 7 days. 14 tablet 0   No current facility-administered medications for this visit.      Objective:  BP 124/79   Pulse 89   Ht 5\' 10"  (1.778 m)   BMI 32.28 kg/m  self reported vitals Gen: NAD, resting comfortably Lungs: nonlabored, normal respiratory rate  Skin: appears dry, no obvious rash    Assessment and Plan   # Cough/Fatigue S:Sx x 2 weeks. Described as a "tickle" in the throat. Non-productive cough. Cough is worse when talking. Also  reports some post nasal drainage. Denies fever, congestion, loss of smell. Reports SOB with exertion and loss of taste d/t medications- his baseline status post bone marrow transplant. Denies exposure to Covid-19. Has been staying home as much as possible d/t immune system. He was in the hosp in May and had pneumonia. He is being treated for Adrenal Insufficiency with hydrocortisone. Denies consistent sinus pain/pressure-has had some sinus pressure, HA, pain/pressure in ears, sore throat. Has tried OTC allergy med with no relief. Has also been using inhalers since July.   Cough when he talks. Feels like coating in throat. . No wheezing. No chest discomfort. Feels more like sinuses or allergies. Cough  has been going on for 2 weeks.feels like having sinus drainage. Can breath through nose without issues. Did have some mild frontal headaches but improved.   In regards to graft-versus-host disease possibility 1.Colonoscopy- GVHD possibly- Dr. Lyndee Hensen is not sure- if he has it its not severe.  2.  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 In lungs hard to see so good to go ahead and treat. flovent  For adrenal insufficiency-Hydrocortisone when left hospital and converted to prednisone 50mg - down to 7.5 per day. 1.5 weeks ago back to hydrocortisone 20 in AM, 10 in PM with plan to keep him on that due to covid 19 and flu season. Had been on 10 for several months until he got sick. Plan to reduce very slowly. Also notes some fatigue, muscle weakness since going down on prednisone.  A/P: 68 year old male with mild dysplastic syndrome status post bone marrow transplant with concern for graft-versus-host disease and ongoing adrenal suppression now with cough for 2 weeks -Went for Covid testing this morning.  He already self quarantined to protect himself - Due to high risk for bacterial infection as well as 2 weeks of symptoms and possible sinus infection-cover with  doxycycline -Reviewed ct 11/05/2018 which did not show full resolution of prior pneumonia- we will need to get follow-up CT scan for 2 reasons- 1 to ensure resolution and 2 to rule out acute disease  #Transition of care back to primary care- patient is seeing [facility] on September 30-he states they are trying to transition his care back to primary care.  We discussed possibly doing labs every 3 months- CBC, CMP, LDH if they suggest and I am open to adding other labs if needed  Steroid-induced hyperglycemia S:Fasting cbgs 90s to 110s. Did have one sugar after lunch 244. Had one evening as low as 99 thoughwithout meds A/P: Continue without medication for now-he will let me know if blood pressure trend up   Essential hypertension bp averaging 130s over 80s at home-controlled on amlodipine 10 mg and metoprolol 25 mg twice daily-changed to metoprolol by [facility] and amlodipine was increased by [facility].  Continue to monitor with current medications  Adrenocortical insufficiency ([place]) Patient feels like he is doing reasonably well on hydrocortisone 20 mg in the morning and 10 mg in the evening-posaconazole may interfere with hydrocortisone absorption and he states [facility] is considering changing this potentially  Recommended follow up: As needed for acute issue  Lab/Order associations:   ICD-10-CM   1. Unresolved pneumonia  J18.9 CT Chest Wo Contrast  2. Steroid-induced hyperglycemia  R73.9    T38.0X5A   3. Essential hypertension  I10   4. Adrenocortical insufficiency (HCC)  E27.40    Meds ordered this encounter  Medications  . doxycycline (VIBRA-TABS) 100 MG tablet    Sig: Take 1 tablet (100 mg total) by mouth 2 (two) times daily for 7 days.    Dispense:  14 tablet    Refill:  0   Return precautions advised.  Garret Reddish, MD

## 2019-02-25 NOTE — Addendum Note (Signed)
Addended by: Jasper Loser on: 02/25/2019 08:02 AM   Modules accepted: Orders

## 2019-02-26 ENCOUNTER — Ambulatory Visit: Payer: Medicare Other | Admitting: Family Medicine

## 2019-02-26 ENCOUNTER — Telehealth: Payer: Self-pay | Admitting: Family Medicine

## 2019-02-26 NOTE — Telephone Encounter (Signed)
Called pt - gave him appt details about his CT.  He knows we have to have the results of the COVID test in order for him to keep the appt.  He will call and cancel the CT if he does not hear from someone in our in the morning.    Please call the patient when the results come in.

## 2019-02-27 ENCOUNTER — Ambulatory Visit (INDEPENDENT_AMBULATORY_CARE_PROVIDER_SITE_OTHER)
Admission: RE | Admit: 2019-02-27 | Discharge: 2019-02-27 | Disposition: A | Discharge status: Home / Self Care | Payer: Medicare Other | Source: Ambulatory Visit | Attending: Family Medicine | Admitting: Family Medicine

## 2019-02-27 ENCOUNTER — Other Ambulatory Visit: Payer: Self-pay

## 2019-02-27 DIAGNOSIS — J189 Pneumonia, unspecified organism: Secondary | ICD-10-CM

## 2019-02-27 LAB — NOVEL CORONAVIRUS, NAA: SARS-CoV-2, NAA: NOT DETECTED

## 2019-02-27 NOTE — Telephone Encounter (Signed)
Results for Covid test received and are negative.  Called pt and advised. Forwarding to [place] as FYI.

## 2019-03-03 ENCOUNTER — Encounter: Payer: Self-pay | Admitting: Family Medicine

## 2019-03-04 MED ORDER — BENZONATATE 100 MG PO CAPS: 100.0000 mg | ORAL_CAPSULE | Freq: Two times a day (BID) | ORAL | 0 refills | Status: DC | PRN

## 2019-03-10 ENCOUNTER — Ambulatory Visit (INDEPENDENT_AMBULATORY_CARE_PROVIDER_SITE_OTHER): Payer: Medicare Other | Admitting: Family Medicine

## 2019-03-10 ENCOUNTER — Encounter: Payer: Self-pay | Admitting: Family Medicine

## 2019-03-10 VITALS — BP 122/77 | HR 77 | Ht 70.0 in | Wt 220.0 lb

## 2019-03-10 DIAGNOSIS — T380X5A Adverse effect of glucocorticoids and synthetic analogues, initial encounter: Secondary | ICD-10-CM | POA: Diagnosis not present

## 2019-03-10 DIAGNOSIS — I10 Essential (primary) hypertension: Secondary | ICD-10-CM

## 2019-03-10 DIAGNOSIS — D89813 Graft-versus-host disease, unspecified: Secondary | ICD-10-CM

## 2019-03-10 DIAGNOSIS — E785 Hyperlipidemia, unspecified: Secondary | ICD-10-CM | POA: Diagnosis not present

## 2019-03-10 DIAGNOSIS — Z8673 Personal history of transient ischemic attack (TIA), and cerebral infarction without residual deficits: Secondary | ICD-10-CM | POA: Insufficient documentation

## 2019-03-10 DIAGNOSIS — R739 Hyperglycemia, unspecified: Secondary | ICD-10-CM | POA: Diagnosis not present

## 2019-03-10 MED ORDER — GLIPIZIDE 5 MG PO TABS: 2.5000 mg | ORAL_TABLET | Freq: Every day | ORAL | 3 refills | Status: DC

## 2019-03-10 NOTE — Progress Notes (Deleted)
Phone [phone]   Subjective:  Brandon [name] is a 68 y.o. year old very pleasant male patient who presents for transitional care management and hospital follow up for blurry vision. Patient was hospitalized from 03/02/2019 to 03/03/2019. A TCM phone call was completed on ***. Medical complexity ***   ***   See problem oriented charting as well ROS- ***   Past Medical History-  Patient Active Problem List   Diagnosis Date Noted  . Adrenal crisis ([place]) 11/05/2018  . History of DVT (deep vein thrombosis) 09/25/2018  . Adrenocortical insufficiency ([place]) 08/06/2017  . Post chemotherapy Dry eyes due to decreased tear production 08/06/2017  . post chemotherapy Peripheral neuropathy 08/06/2017  . History of stem cell transplant ([place]) 03/04/2017  . 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 MDS (myelodysplastic syndrome) ([place]) 12/06/2015  . BPH associated with nocturia 11/15/2014  . Leukopenia 11/15/2014  . Steroid-induced hyperglycemia 08/16/2014  . Chest pain 08/13/2014  . GERD (gastroesophageal reflux disease) 05/03/2014  . Cervical disc disorder with radiculopathy of cervical region 07/14/2010  . MIXED HEARING LOSS BILATERAL 07/14/2010  . ERECTILE DYSFUNCTION 02/07/2007  . Hyperlipidemia 02/04/2007  . Essential hypertension 02/04/2007    Medications- reviewed and updated  A medical reconciliation was performed comparing current medicines to hospital discharge medications. Current Outpatient Medications  Medication Sig Dispense Refill  . acyclovir (ZOVIRAX) 400 MG tablet Take 400 mg by mouth 2 (two) times daily.    Marland Kitchen albuterol (PROVENTIL HFA;VENTOLIN HFA) 108 (90 Base) MCG/ACT inhaler Inhale 1-2 puffs into the lungs every 6 (six) hours as needed for wheezing or shortness of breath.     Marland Kitchen amLODipine (NORVASC) 5 MG tablet Take 2 tablets by mouth daily.    Marland Kitchen aspirin EC 81 MG tablet Take 81 mg by mouth at bedtime.    . benzonatate (TESSALON) 100 MG capsule Take 1  capsule (100 mg total) by mouth 2 (two) times daily as needed for cough. 20 capsule 0  . Cholecalciferol (VITAMIN D) 50 MCG (2000 UT) CAPS Take 2,000 Units by mouth at bedtime.     . diphenhydrAMINE HCl (BENADRYL ALLERGY PO) Take by mouth at bedtime as needed.    . famotidine (PEPCID) 20 MG tablet Take 20 mg by mouth daily.    . fluticasone (FLONASE) 50 MCG/ACT nasal spray Place 2 sprays into both nostrils daily as needed for allergies or rhinitis.    . fluticasone (FLOVENT HFA) 220 MCG/ACT inhaler Inhale 2 puffs into the lungs 2 (two) times daily.     Marland Kitchen guaiFENesin (MUCINEX PO) Take by mouth.    . hydrocortisone (CORTEF) 10 MG tablet Take 10 mg by mouth every evening.    . hydrocortisone (CORTEF) 20 MG tablet Take 20 mg by mouth daily after breakfast.    . hydrocortisone cream 1 % Apply 1 application topically as needed for itching.    . Lancets (ONETOUCH DELICA PLUS 123XX123) MISC     . loratadine (CLARITIN) 10 MG tablet Take 10 mg by mouth daily.    Marland Kitchen LORazepam (ATIVAN) 0.5 MG tablet Take 0.5 mg by mouth every 4 (four) hours as needed for anxiety.    . Magnesium Oxide 400 MG CAPS Take 1 capsule (400 mg total) by mouth 5 (five) times daily. (Patient taking differently: Take 400 mg by mouth daily. )    . Melatonin 3 MG TBDP Take by mouth at bedtime as needed.    . metoprolol tartrate (LOPRESSOR) 25 MG tablet Take 1 tablet by mouth  2 (two) times daily.    . montelukast (SINGULAIR) 10 MG tablet Take 10 mg by mouth daily.     . Multiple Vitamin (MULTI-VITAMIN) tablet Take by mouth.    Marland Kitchen omeprazole (PRILOSEC) 40 MG capsule Take 40 mg by mouth 2 (two) times daily.    . ondansetron (ZOFRAN-ODT) 8 MG disintegrating tablet Take 8 mg by mouth every 8 (eight) hours as needed for nausea or vomiting.    . OxyCODONE HCl, Abuse Deter, (OXAYDO) 5 MG TABA Take 5 mg by mouth every 6 (six) hours as needed.    . Polyethylene Glycol 3350 (MIRALAX PO) Take by mouth.    . polyvinyl alcohol (LIQUIFILM TEARS) 1.4 %  ophthalmic solution Place 1 drop into both eyes as needed for dry eyes.    . posaconazole (NOXAFIL) 100 MG TBEC delayed-release tablet Take 300 mg by mouth daily.    Marland Kitchen senna-docusate (SENOKOT-S) 8.6-50 MG tablet Take 2 tablets by mouth at bedtime.      No current facility-administered medications for this visit.    Objective  Objective:  There were no vitals taken for this visit. Gen: NAD, resting comfortably CV: RRR no murmurs rubs or gallops Lungs: CTAB no crackles, wheeze, rhonchi Abdomen: soft/nontender/nondistended/normal bowel sounds. No rebound or guarding.  Ext: no edema Skin: warm, dry Neuro: grossly normal, moves all extremities  ***   Assessment and Plan:   # Diplopia S:Pt woke up on 9/7 around 4:30 AM with double vision. He went to the bathroom and then back to bed, hoping sx would resolve. When he woke up at 9:30 AM his sx were persisting so he contacted [facility] and proceeded to the ED. He denied numbness, tingling, limb weakness, slurred speech, and visual eye changes.  A/P: ***     No problem-specific Assessment & Plan notes found for this encounter.   Recommended follow up: *** Future Appointments  Date Time Provider [place]  03/10/2019  4:20 PM [name], MD [facility]    Lab/Order associations: No diagnosis found.  No orders of the defined types were placed in this encounter.   Return precautions advised.  Jasper Loser, CMA

## 2019-03-10 NOTE — Progress Notes (Addendum)
Phone [phone]   Subjective:  Virtual visit via Video note. Chief complaint: Chief Complaint  Patient presents with  . Hospitalization Follow-up    This visit type was conducted due to national recommendations for restrictions regarding the COVID-19 Pandemic (e.g. social distancing).  This format is felt to be most appropriate for this patient at this time balancing risks to patient and risks to population by having him in for in person visit.  No physical exam was performed (except for noted visual exam or audio findings with Telehealth visits).    Our team/I connected with Elisabeth Most at  4:20 PM EDT by a video enabled telemedicine application (doxy.me or caregility through epic) and verified that I am speaking with the correct person using two identifiers.  Location patient: Home-O[place] Location provider: [facility], office Persons participating in the virtual visit:  patient  Our team/I discussed the limitations of evaluation and management by telemedicine and the availability of in person appointments. In light of current covid-19 pandemic, patient also understands that we are trying to protect them by minimizing in office contact if at all possible.  The patient expressed consent for telemedicine visit and agreed to proceed. Patient understands insurance will be billed.   ROS-double vision is largely resolved.  No fever chills.  Continued cough.  Sugars appear well controlled.  Blood pressure has been well controlled.  No chest pain or shortness of breath reported.  Past Medical History-  Patient Active Problem List   Diagnosis Date Noted  . History of CVA (cerebrovascular accident) 03/10/2019    Priority: High  . Adrenocortical insufficiency ([place]) 08/06/2017    Priority: High  . GVHD (graft versus host disease) ([place]) 03/04/2017    Priority: High  . MDS (myelodysplastic syndrome) ([place]) 12/06/2015    Priority: High  . Steroid-induced hyperglycemia 08/16/2014    Priority:  High  . History of DVT (deep vein thrombosis) 09/25/2018    Priority: Medium  . Post chemotherapy Dry eyes due to decreased tear production 08/06/2017    Priority: Medium  . post chemotherapy Peripheral neuropathy 08/06/2017    Priority: Medium  . BPH associated with nocturia 11/15/2014    Priority: Medium  . Hyperlipidemia 02/04/2007    Priority: Medium  . Essential hypertension 02/04/2007    Priority: Medium  . History of stem cell transplant ([place]) 03/04/2017    Priority: Low  . Leukopenia 11/15/2014    Priority: Low  . Chest pain 08/13/2014    Priority: Low  . GERD (gastroesophageal reflux disease) 05/03/2014    Priority: Low  . Cervical disc disorder with radiculopathy of cervical region 07/14/2010    Priority: Low  . MIXED HEARING LOSS BILATERAL 07/14/2010    Priority: Low  . ERECTILE DYSFUNCTION 02/07/2007    Priority: Low  . Adrenal crisis ([place]) 11/05/2018    Medications- reviewed and updated Current Outpatient Medications  Medication Sig Dispense Refill  . acyclovir (ZOVIRAX) 400 MG tablet Take 400 mg by mouth 2 (two) times daily.    Marland Kitchen albuterol (PROVENTIL HFA;VENTOLIN HFA) 108 (90 Base) MCG/ACT inhaler Inhale 1-2 puffs into the lungs every 6 (six) hours as needed for wheezing or shortness of breath.     Marland Kitchen amLODipine (NORVASC) 5 MG tablet Take 2 tablets by mouth daily.    Marland Kitchen aspirin EC 325 MG tablet Take 81 mg by mouth at bedtime.     . benzonatate (TESSALON) 100 MG capsule Take 1 capsule (100 mg total) by mouth 2 (two) times daily as  needed for cough. 20 capsule 0  . Cholecalciferol (VITAMIN D) 50 MCG (2000 UT) CAPS Take 2,000 Units by mouth at bedtime.     . diphenhydrAMINE HCl (BENADRYL ALLERGY PO) Take by mouth at bedtime as needed.    . famotidine (PEPCID) 20 MG tablet Take 20 mg by mouth daily.    . fluticasone (FLONASE) 50 MCG/ACT nasal spray Place 2 sprays into both nostrils daily as needed for allergies or rhinitis.    . fluticasone (FLOVENT HFA) 220 MCG/ACT  inhaler Inhale 2 puffs into the lungs 2 (two) times daily.     Marland Kitchen guaiFENesin (MUCINEX PO) Take by mouth.    . hydrocortisone (CORTEF) 10 MG tablet Take 10 mg by mouth every evening.    . hydrocortisone (CORTEF) 20 MG tablet Take 20 mg by mouth daily after breakfast.    . hydrocortisone cream 1 % Apply 1 application topically as needed for itching.    . Lancets (ONETOUCH DELICA PLUS 123XX123) MISC     . loratadine (CLARITIN) 10 MG tablet Take 10 mg by mouth daily.    Marland Kitchen LORazepam (ATIVAN) 0.5 MG tablet Take 0.5 mg by mouth every 4 (four) hours as needed for anxiety.    . Magnesium Oxide 400 MG CAPS Take 1 capsule (400 mg total) by mouth 5 (five) times daily. (Patient taking differently: Take 400 mg by mouth daily. )    . Melatonin 3 MG TBDP Take by mouth at bedtime as needed.    . metoprolol tartrate (LOPRESSOR) 25 MG tablet Take 1 tablet by mouth 2 (two) times daily.    . montelukast (SINGULAIR) 10 MG tablet Take 10 mg by mouth daily.     . Multiple Vitamin (MULTI-VITAMIN) tablet Take by mouth.    Marland Kitchen omeprazole (PRILOSEC) 40 MG capsule Take 40 mg by mouth 2 (two) times daily.    . ondansetron (ZOFRAN-ODT) 8 MG disintegrating tablet Take 8 mg by mouth every 8 (eight) hours as needed for nausea or vomiting.    . OxyCODONE HCl, Abuse Deter, (OXAYDO) 5 MG TABA Take 5 mg by mouth every 6 (six) hours as needed.    . Polyethylene Glycol 3350 (MIRALAX PO) Take by mouth.    . polyvinyl alcohol (LIQUIFILM TEARS) 1.4 % ophthalmic solution Place 1 drop into both eyes as needed for dry eyes.    . posaconazole (NOXAFIL) 100 MG TBEC delayed-release tablet Take 300 mg by mouth daily.    Marland Kitchen senna-docusate (SENOKOT-S) 8.6-50 MG tablet Take 2 tablets by mouth at bedtime.     Marland Kitchen glipiZIDE (GLUCOTROL) 5 MG tablet Take 0.5 tablets (2.5 mg total) by mouth daily before breakfast. 45 tablet 3  . rosuvastatin (CRESTOR) 10 MG tablet TAKE 1 TABLET BY MOUTH EVERY DAY AT NIGHT     No current facility-administered medications  for this visit.      Objective:  BP 122/77   Pulse 77   Ht 5\' 10"  (1.778 m)   Wt 220 lb (99.8 kg)   BMI 31.57 kg/m  self reported vitals Gen: NAD, resting comfortably Lungs: nonlabored, normal respiratory rate  Skin: appears dry, no obvious rash    Assessment and Plan   # Diplopia/history of stroke S:Pt woke up on 9/7 around 4:30 AM with double vision. He went to the bathroom and then back to bed, hoping sx would resolve. When he woke up at 9:30 AM his sx were persisting so he contacted [facility] and proceeded to the ED. He denied numbness, tingling,  limb weakness, slurred speech at that time-only issue was double vision.Double vision almost gone within 24 hours- he thinks it is now completely gone.   CT of the head showed possible old left cerebellar stroke with no acute process.  CTA without significant stenosis.  MRI of the brain without contrast with thin cuts through the brainstem was done- no absolutely clear stroke was noted but suspicious for very small abnormality in the left tectum so it was considered a ischemic stroke in combination with symptoms.  TTE showed normal ejection fraction with no obvious thrombus.  They consider placing patient on Plavix and aspirin in combination but there was concern with his posaconazole (which would decrease effectiveness of Plavix).  Ultimately patient was transitioned from Asa 81 mg up to 325 mg with outpatient neurology follow-up planned. A/P: I agree with continuing aspirin 325 mg for now until neurology follow-up.  We will also attempt to modify risk factors as below.  Patient also has follow-up with [facility] plan-Follow-up visits planned-9/25 neuro optho. 30th- sarantopolous- labs planned. Endo oct 14. Neuro 21st.   # Possible GVHD lungs/chronic cough S: Patient with continued issues with cough-despite prior antibiotics with 7-day course of doxycycline-CT of the chest on September 4 showed stable atelectasis in left lower lobe medially but no  obvious pneumonia.  Tessalon doesn't help cough significantly.  Cough is worse with talking. Comes In spells.  Able to sleep. Deep breathing/breathing exercises helps.  He is also on Singulair and Flovent in case there is graft-versus-host disease in the lungs.  Also has albuterol as needed A/P: Patient with chronic cough- possible GVHD-he will continue Flovent and Singulair.  We did not renew Tessalon is only minimally helpful.  He will continue deep breathing exercises and breathing out through pursed lips to try to help with any atelectasis.   #Hyperlipidemia S: During hospitalization patient was started on 10 mg Crestor- Dr. Octavia Heir called him- was ok with crestor- would consider stopping posaconazole potentially.  A/P: Hopefully improved-LDL goal under 70 post stroke.  We will plan on lipid panel and LFTs in 6 weeks- patient will call to schedule.  He is also going to get LFTs with [facility] approximately 2 weeks    #hypertension S: controlled on amlodipine 10 mg Home monitoring with blood pressures between 114-123/70-82 BP Readings from Last 3 Encounters:  03/10/19 122/77  02/25/19 124/79  11/08/18 (!) 153/85  A/P:  Stable. Continue current medications.     #Steroid-induced hyperglycemia S: Poorly controlled on last check here.  Also had another A1c at the hospital which was 7.7.  1st week of august was on glipizide- 5 mg one in AM and one at dinner. Blood sugars got low at dinner time or next morning  CBGs-in the morning typically from 90-1 20.  Post meals typically under 150 or 160  Lab Results  Component Value Date   HGBA1C 8.7 (H) 01/15/2019   HGBA1C 6.5 08/22/2018   HGBA1C 6.0 11/02/2015   A/P: Poor control with A1c at Hospital of 7.7 though does seem to be trending down.  1 to be aggressive with risk factor modification so we will start glipizide 2.5 mg before breakfast-starting very low-dose to try to avoid hypoglycemia  Recommended follow up: 6 week video. Order labs  today as he wants to do labs a few days before his visit  Lab/Order associations:   ICD-10-CM   1. Steroid-induced hyperglycemia  R73.9 Hemoglobin A1c   T38.0X5A Microalbumin / creatinine urine ratio  2. GVHD (graft  versus host disease) ([place])  D89.813   3. Hyperlipidemia, unspecified hyperlipidemia type  E78.5 CBC with Differential/Platelet    Comprehensive metabolic panel    Lipid panel  4. Essential hypertension  I10   5. History of CVA (cerebrovascular accident)  Z62.73     Meds ordered this encounter  Medications  . glipiZIDE (GLUCOTROL) 5 MG tablet    Sig: Take 0.5 tablets (2.5 mg total) by mouth daily before breakfast.    Dispense:  45 tablet    Refill:  3   Return precautions advised.  Garret Reddish, MD  Addendum 04/02/19: Had home health care services initiated by [facility] through [facility]- I am assuming that care/signing off on ongoing orders.  Garret Reddish, MD

## 2019-03-10 NOTE — Patient Instructions (Addendum)
Health Maintenance Due  Topic Date Due  . URINE MICROALBUMIN  08/26/1960  . TETANUS/TDAP  12/06/2015  . INFLUENZA VACCINE - done, abstracted 01/24/2019    Depression screen [facility] 2/9 09/24/2018 03/22/2017 11/15/2014  Decreased Interest 0 0 0  Down, Depressed, Hopeless 0 0 0  PHQ - 2 Score 0 0 0

## 2019-03-10 NOTE — Assessment & Plan Note (Signed)
#   Diplopia/history of stroke S:Pt woke up on 9/7 around 4:30 AM with double vision. He went to the bathroom and then back to bed, hoping sx would resolve. When he woke up at 9:30 AM his sx were persisting so he contacted [facility] and proceeded to the ED. He denied numbness, tingling, limb weakness, slurred speech at that time-only issue was double vision.Double vision almost gone within 24 hours- he thinks it is now completely gone.   CT of the head showed possible old left cerebellar stroke with no acute process.  CTA without significant stenosis.  MRI of the brain without contrast with thin cuts through the brainstem was done- no absolutely clear stroke was noted but suspicious for very small abnormality in the left tectum so it was considered a ischemic stroke in combination with symptoms.  TTE showed normal ejection fraction with no obvious thrombus.  They consider placing patient on Plavix and aspirin in combination but there was concern with his posaconazole (which would decrease effectiveness of Plavix).  Ultimately patient was transitioned from Asa 81 mg up to 325 mg with outpatient neurology follow-up planned. A/P: I agree with continuing aspirin 325 mg for now until neurology follow-up.  We will also attempt to modify risk factors as below.  Patient also has follow-up with [facility] plan-Follow-up visits planned-9/25 neuro optho. 30th- sarantopolous- labs planned. Endo oct 14. Neuro 21st.

## 2019-03-13 ENCOUNTER — Other Ambulatory Visit: Payer: Self-pay | Admitting: Physical Therapy

## 2019-03-13 NOTE — Telephone Encounter (Signed)
Copied from [place] [phone]. Topic: General - Other >> Mar 13, 2019  2:36 PM Pauline Good wrote: Reason for CRM: pt need orders for labs for his 6wk f/u appt

## 2019-03-16 DIAGNOSIS — Z7952 Long term (current) use of systemic steroids: Secondary | ICD-10-CM

## 2019-03-16 DIAGNOSIS — Z8673 Personal history of transient ischemic attack (TIA), and cerebral infarction without residual deficits: Secondary | ICD-10-CM

## 2019-03-16 DIAGNOSIS — K219 Gastro-esophageal reflux disease without esophagitis: Secondary | ICD-10-CM

## 2019-03-16 DIAGNOSIS — Z9181 History of falling: Secondary | ICD-10-CM

## 2019-03-16 DIAGNOSIS — G4733 Obstructive sleep apnea (adult) (pediatric): Secondary | ICD-10-CM

## 2019-03-16 DIAGNOSIS — E119 Type 2 diabetes mellitus without complications: Secondary | ICD-10-CM

## 2019-03-16 DIAGNOSIS — Z7982 Long term (current) use of aspirin: Secondary | ICD-10-CM

## 2019-03-16 DIAGNOSIS — D469 Myelodysplastic syndrome, unspecified: Secondary | ICD-10-CM

## 2019-03-16 DIAGNOSIS — T8600 Unspecified complication of bone marrow transplant: Secondary | ICD-10-CM

## 2019-03-16 DIAGNOSIS — E7800 Pure hypercholesterolemia, unspecified: Secondary | ICD-10-CM

## 2019-03-16 DIAGNOSIS — Z7984 Long term (current) use of oral hypoglycemic drugs: Secondary | ICD-10-CM

## 2019-03-16 DIAGNOSIS — D89813 Graft-versus-host disease, unspecified: Secondary | ICD-10-CM

## 2019-03-16 DIAGNOSIS — I10 Essential (primary) hypertension: Secondary | ICD-10-CM | POA: Diagnosis not present

## 2019-03-16 DIAGNOSIS — H5123 Internuclear ophthalmoplegia, bilateral: Secondary | ICD-10-CM

## 2019-03-16 DIAGNOSIS — M1990 Unspecified osteoarthritis, unspecified site: Secondary | ICD-10-CM

## 2019-03-16 MED ORDER — BENZONATATE 100 MG PO CAPS: 100.0000 mg | ORAL_CAPSULE | Freq: Two times a day (BID) | ORAL | 0 refills | Status: DC | PRN

## 2019-03-16 NOTE — Telephone Encounter (Signed)
Called pt to schedule lab visit and advise that orders have been placed.   He is requesting refill on Tessalon.   Forwarding to Dr. Yong Channel to approve/deny.

## 2019-03-16 NOTE — Addendum Note (Signed)
Addended by: Jasper Loser on: 03/16/2019 11:32 AM   Modules accepted: Orders

## 2019-03-31 ENCOUNTER — Encounter: Payer: Self-pay | Admitting: Family Medicine

## 2019-04-06 ENCOUNTER — Other Ambulatory Visit: Payer: Self-pay

## 2019-04-06 DIAGNOSIS — E2740 Unspecified adrenocortical insufficiency: Secondary | ICD-10-CM

## 2019-04-06 DIAGNOSIS — D469 Myelodysplastic syndrome, unspecified: Secondary | ICD-10-CM

## 2019-04-07 ENCOUNTER — Other Ambulatory Visit: Payer: Self-pay

## 2019-04-07 ENCOUNTER — Inpatient Hospital Stay: Payer: Medicare Other | Attending: Hematology and Oncology

## 2019-04-07 DIAGNOSIS — Z79899 Other long term (current) drug therapy: Secondary | ICD-10-CM | POA: Diagnosis not present

## 2019-04-07 DIAGNOSIS — D469 Myelodysplastic syndrome, unspecified: Secondary | ICD-10-CM

## 2019-04-07 DIAGNOSIS — D4621 Refractory anemia with excess of blasts 1: Secondary | ICD-10-CM | POA: Diagnosis not present

## 2019-04-07 DIAGNOSIS — E2740 Unspecified adrenocortical insufficiency: Secondary | ICD-10-CM

## 2019-04-07 LAB — CBC WITH DIFFERENTIAL (CANCER CENTER ONLY)
Abs Immature Granulocytes: 0.06 10*3/uL (ref 0.00–0.07)
Basophils Absolute: 0.1 10*3/uL (ref 0.0–0.1)
Basophils Relative: 1 %
Eosinophils Absolute: 0.3 10*3/uL (ref 0.0–0.5)
Eosinophils Relative: 4 %
HCT: 40.7 % (ref 39.0–52.0)
Hemoglobin: 13.5 g/dL (ref 13.0–17.0)
Immature Granulocytes: 1 %
Lymphocytes Relative: 20 %
Lymphs Abs: 1.9 10*3/uL (ref 0.7–4.0)
MCH: 31.6 pg (ref 26.0–34.0)
MCHC: 33.2 g/dL (ref 30.0–36.0)
MCV: 95.3 fL (ref 80.0–100.0)
Monocytes Absolute: 1.1 10*3/uL — ABNORMAL HIGH (ref 0.1–1.0)
Monocytes Relative: 11 %
Neutro Abs: 5.9 10*3/uL (ref 1.7–7.7)
Neutrophils Relative %: 63 %
Platelet Count: 314 10*3/uL (ref 150–400)
RBC: 4.27 MIL/uL (ref 4.22–5.81)
RDW: 14.3 % (ref 11.5–15.5)
WBC Count: 9.3 10*3/uL (ref 4.0–10.5)
nRBC: 0 % (ref 0.0–0.2)

## 2019-04-07 LAB — CMP (CANCER CENTER ONLY)
ALT: 35 U/L (ref 0–44)
AST: 24 U/L (ref 15–41)
Albumin: 3.7 g/dL (ref 3.5–5.0)
Alkaline Phosphatase: 147 U/L — ABNORMAL HIGH (ref 38–126)
Anion gap: 12 (ref 5–15)
BUN: 12 mg/dL (ref 8–23)
CO2: 24 mmol/L (ref 22–32)
Calcium: 9.5 mg/dL (ref 8.9–10.3)
Chloride: 107 mmol/L (ref 98–111)
Creatinine: 0.93 mg/dL (ref 0.61–1.24)
GFR, Est AFR Am: >60 mL/min (ref >60)
GFR, Estimated: >60 mL/min (ref >60)
Glucose, Bld: 105 mg/dL — ABNORMAL HIGH (ref 70–99)
Potassium: 3.8 mmol/L (ref 3.5–5.1)
Sodium: 143 mmol/L (ref 135–145)
Total Bilirubin: 0.6 mg/dL (ref 0.3–1.2)
Total Protein: 6.8 g/dL (ref 6.5–8.1)

## 2019-04-07 LAB — LACTATE DEHYDROGENASE: LDH: 170 U/L (ref 98–192)

## 2019-04-07 LAB — HEMOGLOBIN A1C
Hgb A1c MFr Bld: 6.1 % — ABNORMAL HIGH (ref 4.8–5.6)
Mean Plasma Glucose: 128.37 mg/dL

## 2019-04-07 LAB — MAGNESIUM: Magnesium: 1.9 mg/dL (ref 1.7–2.4)

## 2019-04-08 LAB — IGG: IgG (Immunoglobin G), Serum: 814 mg/dL (ref 603–1613)

## 2019-04-09 ENCOUNTER — Telehealth: Payer: Self-pay | Admitting: Family Medicine

## 2019-04-09 NOTE — Telephone Encounter (Signed)
I called the patient to schedule AWV with Loma Sousa on same day as labs (04/14/2019). His wife said that he's not in right now, so I'll try to call later.

## 2019-04-14 ENCOUNTER — Other Ambulatory Visit (INDEPENDENT_AMBULATORY_CARE_PROVIDER_SITE_OTHER): Payer: Medicare Other

## 2019-04-14 ENCOUNTER — Other Ambulatory Visit: Payer: Self-pay

## 2019-04-14 DIAGNOSIS — T380X5A Adverse effect of glucocorticoids and synthetic analogues, initial encounter: Secondary | ICD-10-CM | POA: Diagnosis not present

## 2019-04-14 DIAGNOSIS — R739 Hyperglycemia, unspecified: Secondary | ICD-10-CM | POA: Diagnosis not present

## 2019-04-14 DIAGNOSIS — E785 Hyperlipidemia, unspecified: Secondary | ICD-10-CM | POA: Diagnosis not present

## 2019-04-14 LAB — LIPID PANEL
Cholesterol: 146 mg/dL (ref 0–200)
HDL: 53 mg/dL (ref >39.00)
LDL Cholesterol: 55 mg/dL (ref 0–99)
NonHDL: 92.77
Total CHOL/HDL Ratio: 3
Triglycerides: 191 mg/dL — ABNORMAL HIGH (ref 0.0–149.0)
VLDL: 38.2 mg/dL (ref 0.0–40.0)

## 2019-04-14 LAB — MICROALBUMIN / CREATININE URINE RATIO
Creatinine,U: 151.4 mg/dL
Microalb Creat Ratio: 3.4 mg/g (ref 0.0–30.0)
Microalb, Ur: 5.1 mg/dL — ABNORMAL HIGH (ref 0.0–1.9)

## 2019-04-14 NOTE — Addendum Note (Signed)
Addended by: Francis Dowse T on: 04/14/2019 09:23 AM   Modules accepted: Orders

## 2019-04-17 ENCOUNTER — Encounter: Payer: Self-pay | Admitting: Family Medicine

## 2019-04-17 ENCOUNTER — Ambulatory Visit (INDEPENDENT_AMBULATORY_CARE_PROVIDER_SITE_OTHER): Payer: Medicare Other | Admitting: Family Medicine

## 2019-04-17 DIAGNOSIS — E785 Hyperlipidemia, unspecified: Secondary | ICD-10-CM | POA: Diagnosis not present

## 2019-04-17 DIAGNOSIS — I10 Essential (primary) hypertension: Secondary | ICD-10-CM

## 2019-04-17 DIAGNOSIS — R739 Hyperglycemia, unspecified: Secondary | ICD-10-CM | POA: Diagnosis not present

## 2019-04-17 DIAGNOSIS — Z8673 Personal history of transient ischemic attack (TIA), and cerebral infarction without residual deficits: Secondary | ICD-10-CM

## 2019-04-17 DIAGNOSIS — T380X5A Adverse effect of glucocorticoids and synthetic analogues, initial encounter: Secondary | ICD-10-CM

## 2019-04-17 NOTE — Progress Notes (Signed)
Phone [phone]   Subjective:  Brandon [name] is a 68 y.o. year old very pleasant male patient who presents for/with See problem oriented charting Chief Complaint  Patient presents with  . Follow-up  . Medication Management  . [facility] Findings    In Care Everywhere  . Cough    Believes this is sinus drainage, has tried Claritin, Singuair   This visit type was conducted due to national recommendations for restrictions regarding the COVID-19 Pandemic (e.g. social distancing).  This format is felt to be most appropriate for this patient at this time balancing risks to patient and risks to population by having him in for in person visit.  No physical exam was performed (except for noted visual exam or audio findings with Telehealth visits).    Our team/I connected with Elisabeth Most at  4:00 PM EDT by a video enabled telemedicine application (doxy.me or caregility through epic) and verified that I am speaking with the correct person using two identifiers.  Location patient: Home-O2 Location provider: [facility], office Persons participating in the virtual visit:  patient  Our team/I discussed the limitations of evaluation and management by telemedicine and the availability of in person appointments. In light of current covid-19 pandemic, patient also understands that we are trying to protect them by minimizing in office contact if at all possible.  The patient expressed consent for telemedicine visit and agreed to proceed. Patient understands insurance will be billed.    ROS- some fatigue. Chronic cough. Has sinus drainage. No fever or chills    Past Medical History-  Patient Active Problem List   Diagnosis Date Noted  . History of CVA (cerebrovascular accident) 03/10/2019    Priority: High  . Adrenocortical insufficiency ([place]) 08/06/2017    Priority: High  . GVHD (graft versus host disease) ([place]) 03/04/2017    Priority: High  . MDS (myelodysplastic syndrome) ([place]) 12/06/2015   Priority: High  . Steroid-induced hyperglycemia 08/16/2014    Priority: High  . History of DVT (deep vein thrombosis) 09/25/2018    Priority: Medium  . Post chemotherapy Dry eyes due to decreased tear production 08/06/2017    Priority: Medium  . post chemotherapy Peripheral neuropathy 08/06/2017    Priority: Medium  . BPH associated with nocturia 11/15/2014    Priority: Medium  . Hyperlipidemia 02/04/2007    Priority: Medium  . Essential hypertension 02/04/2007    Priority: Medium  . History of stem cell transplant ([place]) 03/04/2017    Priority: Low  . Leukopenia 11/15/2014    Priority: Low  . Chest pain 08/13/2014    Priority: Low  . GERD (gastroesophageal reflux disease) 05/03/2014    Priority: Low  . Cervical disc disorder with radiculopathy of cervical region 07/14/2010    Priority: Low  . MIXED HEARING LOSS BILATERAL 07/14/2010    Priority: Low  . ERECTILE DYSFUNCTION 02/07/2007    Priority: Low  . Adrenal crisis ([place]) 11/05/2018    Medications- reviewed and updated Current Outpatient Medications  Medication Sig Dispense Refill  . acyclovir (ZOVIRAX) 400 MG tablet Take 400 mg by mouth 2 (two) times daily.    Marland Kitchen albuterol (PROVENTIL HFA;VENTOLIN HFA) 108 (90 Base) MCG/ACT inhaler Inhale 1-2 puffs into the lungs every 6 (six) hours as needed for wheezing or shortness of breath.     Marland Kitchen amLODipine (NORVASC) 5 MG tablet Take 2 tablets by mouth daily.    Marland Kitchen aspirin EC 325 MG tablet Take 81 mg by mouth at bedtime.     Marland Kitchen  benzonatate (TESSALON) 100 MG capsule Take 1 capsule (100 mg total) by mouth 2 (two) times daily as needed for cough. 20 capsule 0  . Cholecalciferol (VITAMIN D) 50 MCG (2000 UT) CAPS Take 2,000 Units by mouth at bedtime.     . diphenhydrAMINE HCl (BENADRYL ALLERGY PO) Take by mouth at bedtime as needed.    . famotidine (PEPCID) 20 MG tablet Take 20 mg by mouth daily.    . fluticasone (FLONASE) 50 MCG/ACT nasal spray Place 2 sprays into both nostrils daily as  needed for allergies or rhinitis.    . fluticasone (FLOVENT HFA) 220 MCG/ACT inhaler Inhale 2 puffs into the lungs 2 (two) times daily.     Marland Kitchen guaiFENesin (MUCINEX PO) Take by mouth.    . hydrocortisone (CORTEF) 10 MG tablet Take 10 mg by mouth every evening.    . hydrocortisone (CORTEF) 20 MG tablet Take 20 mg by mouth daily after breakfast.    . hydrocortisone cream 1 % Apply 1 application topically as needed for itching.    . Lancets (ONETOUCH DELICA PLUS 123XX123) MISC     . loratadine (CLARITIN) 10 MG tablet Take 10 mg by mouth daily.    Marland Kitchen LORazepam (ATIVAN) 0.5 MG tablet Take 0.5 mg by mouth every 4 (four) hours as needed for anxiety.    . Magnesium Oxide 400 MG CAPS Take 1 capsule (400 mg total) by mouth 5 (five) times daily. (Patient taking differently: Take 400 mg by mouth daily. )    . Melatonin 3 MG TBDP Take by mouth at bedtime as needed.    . metoprolol tartrate (LOPRESSOR) 25 MG tablet Take 1 tablet by mouth 2 (two) times daily.    . montelukast (SINGULAIR) 10 MG tablet Take 10 mg by mouth daily.     . Multiple Vitamin (MULTI-VITAMIN) tablet Take by mouth.    Marland Kitchen omeprazole (PRILOSEC) 40 MG capsule Take 40 mg by mouth 2 (two) times daily.    . ondansetron (ZOFRAN-ODT) 8 MG disintegrating tablet Take 8 mg by mouth every 8 (eight) hours as needed for nausea or vomiting.    . OxyCODONE HCl, Abuse Deter, (OXAYDO) 5 MG TABA Take 5 mg by mouth every 6 (six) hours as needed.    . Polyethylene Glycol 3350 (MIRALAX PO) Take by mouth.    . polyvinyl alcohol (LIQUIFILM TEARS) 1.4 % ophthalmic solution Place 1 drop into both eyes as needed for dry eyes.    . posaconazole (NOXAFIL) 100 MG TBEC delayed-release tablet Take 300 mg by mouth daily.    . rosuvastatin (CRESTOR) 10 MG tablet TAKE 1 TABLET BY MOUTH EVERY DAY AT NIGHT    . senna-docusate (SENOKOT-S) 8.6-50 MG tablet Take 2 tablets by mouth at bedtime.      No current facility-administered medications for this visit.      Objective:   BP 129/75   Pulse 74  self reported vitals Gen: NAD, resting comfortably Lungs: nonlabored, normal respiratory rate  Skin: appears dry, no obvious rash    Assessment and Plan   #Chronic cough-already on reflux medication and allergy medication.  Has had imaging already on multiple occasions.  Not recently worsening.  Patient feels like it could be from his sinuses.  If has worsening symptoms to let us know.Trial codeine if needed-he wants to try to remain off of this for now due to trying to minimize medication   History of CVA (cerebrovascular accident) S: Patient saw neurologist Dr. Burr Medico at [facility] on 04/15/2019.  Platelet  inhibition test was completed-looks like patient can stay on aspirin 325 mg.  We had previously had some discussion about potentially switching to Plavix but patient would have to come off posaconazole in that case- A/P: We will continue risk factor modification for prevention of recurrence of stroke.  Continue aspirin 325 mg and Lasix as otherwise.  Continue statin-see hyperlipidemia section  Hyperlipidemia S: compliant with Crestor 10 mg post stroke Lab Results  Component Value Date   CHOL 146 04/14/2019   HDL 53.00 04/14/2019   LDLCALC 55 04/14/2019   LDLDIRECT 92.0 05/04/2015   TRIG 191.0 (H) 04/14/2019   CHOLHDL 3 04/14/2019   A/P: Well controlled.  Patient with phenomenal improvement in LDL down to 55 with over 50% reduction in LDL-continue current medication.    Steroid-induced hyperglycemia S: Patient states glipizide was stopped by one of his [place].  Last A1c was well controlled.  Recently Hydrocortisone down to 25 mg from 30mg - some fatigue with decreasing. Mild nausea CBGs-see flow sheet provided by MyChart-reasonable control overall Exercise and diet-encouraged increasing exercise-he admits this has been low lately Lab Results  Component Value Date   HGBA1C-came by for labs before visit 6.1 (H) 04/07/2019   HGBA1C 8.7 (H) 01/15/2019    HGBA1C 6.5 08/22/2018   A/P: Well-controlled-54-month follow-up planned or sooner if needed    Essential hypertension S: Compliant with amlodipine 5 mg A/P: Well-controlled today-continue current medication   Recommended follow up: 3 months or sooner fi needed Future Appointments  Date Time Provider [place]  07/27/2019 11:00 AM [name], MD [facility]   Lab/Order associations:   ICD-10-CM   1. History of CVA (cerebrovascular accident)  Z86.73   2. Hyperlipidemia, unspecified hyperlipidemia type  E78.5   3. Steroid-induced hyperglycemia  R73.9    T38.0X5A   4. Essential hypertension  I10    Return precautions advised.  Garret Reddish, MD

## 2019-04-18 LAB — ALDOSTERONE + RENIN ACTIVITY W/ RATIO
ALDO / PRA Ratio: <3 (ref 0.0–30.0)
Aldosterone: <1 ng/dL (ref 0.0–30.0)
PRA LC/MS/MS: 0.33 ng/mL/hr (ref 0.167–5.380)

## 2019-04-18 NOTE — Assessment & Plan Note (Signed)
S: Compliant with amlodipine 5 mg A/P: Well-controlled today

## 2019-04-18 NOTE — Assessment & Plan Note (Signed)
S: Patient states glipizide was stopped by one of his [place].  Last A1c was well controlled.  Recently Hydrocortisone down to 25 mg from 30mg - some fatigue with decreasing. Mild nausea CBGs-see flow sheet provided by MyChart-reasonable control overall Exercise and diet-encouraged increasing exercise-he admits this has been low lately Lab Results  Component Value Date   HGBA1C-came by for labs before visit 6.1 (H) 04/07/2019   HGBA1C 8.7 (H) 01/15/2019   HGBA1C 6.5 08/22/2018   A/P: Well-controlled-75-month follow-up planned or sooner if needed

## 2019-04-18 NOTE — Assessment & Plan Note (Signed)
S: Patient saw neurologist Dr. Burr Medico at [facility] on 04/15/2019.  Platelet inhibition test was completed-looks like patient can stay on aspirin 325 mg.  We had previously had some discussion about potentially switching to Plavix but patient would have to come off posaconazole in that case- A/P: We will continue risk factor modification for prevention of recurrence of stroke.  Continue aspirin 325 mg and Lasix as otherwise.  Continue statin-see hyperlipidemia section

## 2019-04-18 NOTE — Patient Instructions (Signed)
Health Maintenance Due  Topic Date Due  . TETANUS/TDAP  12/06/2015    - Flu shot today - High dose flu shot today - declines for this season - will complete later in flu season (please let us know if you get this at another location so we can update your chart)

## 2019-04-18 NOTE — Assessment & Plan Note (Signed)
#  hyperlipidemia S: compliant with Crestor 10 mg post stroke Lab Results  Component Value Date   CHOL 146 04/14/2019   HDL 53.00 04/14/2019   LDLCALC 55 04/14/2019   LDLDIRECT 92.0 05/04/2015   TRIG 191.0 (H) 04/14/2019   CHOLHDL 3 04/14/2019   A/P: Well controlled.  Patient with phenomenal improvement in LDL down to 55 with over 50% reduction in LDL-continue current medication.

## 2019-04-21 ENCOUNTER — Other Ambulatory Visit: Payer: Medicare Other

## 2019-04-24 ENCOUNTER — Ambulatory Visit: Payer: Medicare Other | Admitting: Family Medicine

## 2019-04-27 ENCOUNTER — Other Ambulatory Visit: Payer: Self-pay

## 2019-04-27 DIAGNOSIS — Z20822 Contact with and (suspected) exposure to covid-19: Secondary | ICD-10-CM

## 2019-04-27 DIAGNOSIS — Z20828 Contact with and (suspected) exposure to other viral communicable diseases: Secondary | ICD-10-CM

## 2019-04-27 NOTE — Telephone Encounter (Signed)
Copied from [place] [id number]. Topic: General - Other >> Apr 27, 2019  9:00 AM Celene Kras A wrote: Reason for CRM: Pt called and is requesting to have covid testing protocol as he was in contact with his grandson who was exposed. Please advise .

## 2019-05-06 ENCOUNTER — Ambulatory Visit (INDEPENDENT_AMBULATORY_CARE_PROVIDER_SITE_OTHER): Payer: Medicare Other

## 2019-05-06 ENCOUNTER — Other Ambulatory Visit: Payer: Self-pay

## 2019-05-06 DIAGNOSIS — Z0000 Encounter for general adult medical examination without abnormal findings: Secondary | ICD-10-CM | POA: Diagnosis not present

## 2019-05-06 NOTE — Progress Notes (Signed)
I connected with Charlton Amor on 05/06/19 at 0930 by a video enabled telemedicine application and verified that I am speaking with the correct person using two identifiers. Location patient: Home Location provider: [facility], Office Persons participating in the virtual visit: Denman George LPN and Dr. Garret Reddish    I discussed the limitations of evaluation and management by telemedicine and the availability of in person appointments. The patient expressed understanding and agreed to proceed.  Subjective:   Brandon [name] is a 68 y.o. male who presents for Medicare Annual/Subsequent preventive examination.  Review of Systems:   Cardiac Risk Factors include: advanced age (>11men, >27 women);male gender;dyslipidemia    Objective:    Vitals: There were no vitals taken for this visit.  There is no height or weight on file to calculate BMI.  Advanced Directives 05/06/2019 11/07/2018 11/06/2018 10/21/2018 10/21/2018 07/15/2017 07/15/2017  Does Patient Have a Medical Advance Directive? Yes No Yes Yes Yes Yes No  Type of Advance Directive Living will;Healthcare Power of [place];Living will [place];Living will [place];Living will [place];Living will -  Does patient want to make changes to medical advance directive? No - Patient declined - No - Patient declined No - Patient declined - No - Patient declined No - Patient declined  Copy of [place] in Chart? No - copy requested - - No - copy requested - Yes -  Would patient like information on creating a medical advance directive? - - - - - - -    Tobacco Social History   Tobacco Use  Smoking Status Never Smoker  Smokeless Tobacco Never Used     Counseling given: Not Answered   Clinical Intake:  Pre-visit preparation completed: Yes  Pain : No/denies pain  Diabetes: No  How often do you need to have someone help you  when you read instructions, pamphlets, or other written materials from your doctor or pharmacy?: 1 - Never  Interpreter Needed?: No  Information entered by :: Denman George LPN  Past Medical History:  Diagnosis Date  . Adrenocortical insufficiency ([place]) 08/06/2017   Patient with 2 hospitalizations in January 2019- treated with fluids, antibiotics, steroids- ultimately thought most likely due to adrenal insufficiency with no obvious source of infection found  . Allergy    seasonal/animals  . Chronic prostatitis 12/17/2008  . Diverticulosis 01/16/2011   History of diverticulitis   . ERECTILE DYSFUNCTION 02/07/2007  . HYPERLIPIDEMIA 02/04/2007  . HYPERTENSION 02/04/2007  . NEOPLASM, MALIGNANT, SKIN, BACK 02/09/2009  . Pneumonia, viral 12/2017  . S/P bone marrow transplant [facility])    Past Surgical History:  Procedure Laterality Date  . CATARACT EXTRACTION Left 06/2018  . none     Family History  Problem Relation Age of Onset  . Breast cancer Mother   . CVA Mother        quit taking HTN meds  . Alzheimer's disease Father        late 71s. mid 32s  . Asperger's syndrome Son    Social History   Socioeconomic History  . Marital status: Married    Spouse name: Not on file  . Number of children: Not on file  . Years of education: Not on file  . Highest education level: Not on file  Occupational History  . Not on file  Social Needs  . Financial resource strain: Not hard at all  . Food insecurity    Worry:  Never true    Inability: Never true  . Transportation needs    Medical: No    Non-medical: No  Tobacco Use  . Smoking status: Never Smoker  . Smokeless tobacco: Never Used  Substance and Sexual Activity  . Alcohol use: Yes    Alcohol/week: 3.0 standard drinks    Types: 3 Standard drinks or equivalent per week  . Drug use: No  . Sexual activity: Yes  Lifestyle  . Physical activity    Days per week: Patient refused    Minutes per session: Patient refused  . Stress: Not  on file  Relationships  . Social Herbalist on phone: Twice a week    Gets together: Never    Attends religious service: More than 4 times per year    Active member of club or organization: No    Attends meetings of clubs or organizations: Never    Relationship status: Married  Other Topics Concern  . Not on file  Social History Narrative   Married 1973. Wife has Parkinsons. 2 daughters, 1 son (middle). 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 acyclovir (ZOVIRAX) 400 MG tablet Take 400 mg by mouth 2 (two) times daily.  Marland Kitchen albuterol (PROVENTIL HFA;VENTOLIN HFA) 108 (90 Base) MCG/ACT inhaler Inhale 1-2 puffs into the lungs every 6 (six) hours as needed for wheezing or shortness of breath.   Marland Kitchen amLODipine (NORVASC) 5 MG tablet Take 2 tablets by mouth daily.  Marland Kitchen aspirin EC 325 MG tablet Take 81 mg by mouth at bedtime.   . benzonatate (TESSALON) 100 MG capsule Take 1 capsule (100 mg total) by mouth 2 (two) times daily as needed for cough.  . Cholecalciferol (VITAMIN D) 50 MCG (2000 UT) CAPS Take 2,000 Units by mouth at bedtime.   . diphenhydrAMINE HCl (BENADRYL ALLERGY PO) Take by mouth at bedtime as needed.  . famotidine (PEPCID) 20 MG tablet Take 20 mg by mouth daily.  . fluticasone (FLONASE) 50 MCG/ACT nasal spray Place 2 sprays into both nostrils daily as needed for allergies or rhinitis.  . fluticasone (FLOVENT HFA) 220 MCG/ACT inhaler Inhale 2 puffs into the lungs 2 (two) times daily.   Marland Kitchen guaiFENesin (MUCINEX PO) Take by mouth.  . hydrocortisone (CORTEF) 10 MG tablet Take 10 mg by mouth every evening.  . hydrocortisone (CORTEF) 20 MG tablet Take 20 mg by mouth daily after breakfast.  . hydrocortisone cream 1 % Apply 1 application topically as needed for itching.  . Lancets (ONETOUCH DELICA  PLUS [id number]) MISC   . loratadine (CLARITIN) 10 MG tablet Take 10 mg by mouth daily.  Marland Kitchen LORazepam (ATIVAN) 0.5 MG tablet Take 0.5 mg by mouth every 4 (four) hours as needed for anxiety.  . Magnesium Oxide 400 MG CAPS Take 1 capsule (400 mg total) by mouth 5 (five) times daily. (Patient taking differently: Take 400 mg by mouth daily. )  . Melatonin 3 MG TBDP Take by mouth at bedtime as needed.  . metoprolol tartrate (LOPRESSOR) 25 MG tablet Take 1 tablet by mouth 2 (two) times daily.  . montelukast (SINGULAIR) 10 MG tablet Take 10 mg by mouth daily.   . Multiple Vitamin (MULTI-VITAMIN) tablet Take by mouth.  Marland Kitchen omeprazole (PRILOSEC) 40 MG capsule Take 40 mg by mouth 2 (two)  times daily.  . ondansetron (ZOFRAN-ODT) 8 MG disintegrating tablet Take 8 mg by mouth every 8 (eight) hours as needed for nausea or vomiting.  . OxyCODONE HCl, Abuse Deter, (OXAYDO) 5 MG TABA Take 5 mg by mouth every 6 (six) hours as needed.  . Polyethylene Glycol 3350 (MIRALAX PO) Take by mouth.  . polyvinyl alcohol (LIQUIFILM TEARS) 1.4 % ophthalmic solution Place 1 drop into both eyes as needed for dry eyes.  . posaconazole (NOXAFIL) 100 MG TBEC delayed-release tablet Take 300 mg by mouth daily.  . rosuvastatin (CRESTOR) 10 MG tablet TAKE 1 TABLET BY MOUTH EVERY DAY AT NIGHT  . senna-docusate (SENOKOT-S) 8.6-50 MG tablet Take 2 tablets by mouth at bedtime.    No facility-administered encounter medications on file as of 05/06/2019.     Activities of Daily Living In your present state of health, do you have any difficulty performing the following activities: 05/06/2019 11/06/2018  Hearing? N N  Vision? N N  Difficulty concentrating or making decisions? N N  Walking or climbing stairs? N Y  Comment - -  Dressing or bathing? N N  Doing errands, shopping? N N  Preparing Food and eating ? N -  Using the Toilet? N -  In the past six months, have you accidently leaked urine? N -  Do you have problems with loss of bowel  control? N -  Managing your Medications? N -  Managing your Finances? N -  Housekeeping or managing your Housekeeping? N -  Some recent data might be hidden    Patient Care Team: [name], MD as PCP - General (Family Medicine) Nicholas Lose, MD as Referring Physician (Oncology) Rosalene Billings, MD as Consulting Physician (Neurology) Boisvert, Marvia Pickles, MD as Consulting Physician (Ophthalmology) Liz Malady Diprincipe, MD as Consulting Physician (Endocrinology) Govert, Natale Lay, MD as Consulting Physician (Pulmonary Disease) Cardones, Adela Simmie Davies, MD as Consulting Physician (Dermatology)   Assessment:   This is a routine wellness examination for [place].  Exercise Activities and Dietary recommendations Current Exercise Habits: The patient does not participate in regular exercise at present  Goals    . Exercise 150 min/wk Moderate Activity     [facility] team has asked him to walk Has a treadmill at home Walk outside where you enjoy or have a passion. May find a buddy Set a goal        Fall Risk Fall Risk  05/06/2019 09/24/2018 03/22/2017 02/24/2016 11/15/2014  Falls in the past year? 0 0 No No No  Comment - - - Emmi Telephone Survey: data to providers prior to load -  Injury with Fall? 0 0 - - -  Follow up Falls evaluation completed;Education provided;Falls prevention discussed - - - -   Is the patient's home free of loose throw rugs in walkways, pet beds, electrical cords, etc?   yes      Grab bars in the bathroom? yes      Handrails on the stairs?   yes      Adequate lighting?   yes   Depression Screen PHQ 2/9 Scores 05/06/2019 09/24/2018 03/22/2017 11/15/2014  PHQ - 2 Score 0 0 0 0    Cognitive Function-no cognitive concerns at this time    6CIT Screen 05/06/2019  What Year? 0 points  What month? 0 points  What time? 0 points  Count back from 20 0 points  Months in reverse 0 points  Repeat phrase 0 points  Total Score 0    Immunization  History  Administered Date(s) Administered  . DTaP / HiB / IPV 12/04/2017, 03/05/2018, 08/27/2018  . Hepatitis A 12/25/2005, 11/25/2007  . Hepatitis B 05/25/1996, 06/25/1997, 12/01/1998  . Hepatitis B, adult 12/04/2017, 03/05/2018, 08/27/2018  . Influenza, High Dose Seasonal PF 07/09/2017, 04/16/2018, 02/17/2019  . Influenza,inj,Quad PF,6+ Mos 02/18/2013, 05/03/2014  . Influenza-Unspecified 03/16/2015  . PPD Test 08/15/2012  . Pneumococcal Conjugate-13 05/11/2015, 12/04/2017, 03/05/2018, 08/27/2018  . Pneumococcal Polysaccharide-23 05/03/2014  . Td 12/05/2005  . Zoster 02/15/2012    Qualifies for Shingles Vaccine? Patient will check with oncologist at [facility] for advisement with vaccines   Screening Tests Health Maintenance  Topic Date Due  . TETANUS/TDAP  12/06/2015  . PNA vac Low Risk Adult (2 of 2 - PPSV23) 08/27/2019  . URINE MICROALBUMIN  04/13/2020  . COLONOSCOPY  11/19/2023  . INFLUENZA VACCINE  Completed  . Hepatitis C Screening  Completed   Cancer Screenings: Lung: Low Dose CT Chest recommended if Age 17-80 years, 30 pack-year currently smoking OR have quit w/in 15years. Patient does not qualify. Colorectal: colonoscopy 11/12/18 with Dr. Emelda Fear ([place])     Plan:  I have personally reviewed and addressed the Medicare Annual Wellness questionnaire and have noted the following in the patient's chart:  A. Medical and social history B. Use of alcohol, tobacco or illicit drugs  C. Current medications and supplements D. Functional ability and status E.  Nutritional status F.  Physical activity G. Advance directives H. List of other physicians I.  Hospitalizations, surgeries, and ER visits in previous 12 months J.  [place] such as hearing and vision if needed, cognitive and depression L. Referrals, records requested, and appointments- none   In addition, I have reviewed and discussed with patient certain preventive protocols, quality metrics, and best practice  recommendations. A written personalized care plan for preventive services as well as general preventive health recommendations were provided to patient.   Signed,  Denman George, LPN  Nurse Health Advisor   Nurse Notes: no additional

## 2019-05-06 NOTE — Patient Instructions (Signed)
Brandon [name] , Thank you for taking time to come for your Medicare Wellness Visit. I appreciate your ongoing commitment to your health goals. Please review the following plan we discussed and let me know if I can assist you in the future.   Screening recommendations/referrals: Colorectal Screening: up to date; last colonoscopy 11/12/18  Vision and Dental Exams: Recommended annual ophthalmology exams for early detection of glaucoma and other disorders of the eye Recommended annual dental exams for proper oral hygiene  Vaccinations: Influenza vaccine: completed 02/17/19 Pneumococcal vaccine: up to date; last 08/27/18 Tdap vaccine: follow advise of Dr. [name]  Shingles vaccine: follow advise of Dr. [name]   Advanced directives: Please bring a copy of your POA (Power of [place]) and/or Living Will to your next appointment.  Goals: Recommend to drink at least 6-8 8oz glasses of water per day and consume a balanced diet rich in fresh fruits and vegetables.   Next appointment: Please schedule your Annual Wellness Visit with your Nurse Health Advisor in one year.  Preventive Care 69 Years and Older, Male Preventive care refers to lifestyle choices and visits with your health care provider that can promote health and wellness. What does preventive care include?  A yearly physical exam. This is also called an annual well check.  Dental exams once or twice a year.  Routine eye exams. Ask your health care provider how often you should have your eyes checked.  Personal lifestyle choices, including:  Daily care of your teeth and gums.  Regular physical activity.  Eating a healthy diet.  Avoiding tobacco and drug use.  Limiting alcohol use.  Practicing safe sex.  Taking low doses of aspirin every day if recommended by your health care provider..  Taking vitamin and mineral supplements as recommended by your health care provider. What happens during an annual well check?  The services and screenings done by your health care provider during your annual well check will depend on your age, overall health, lifestyle risk factors, and family history of disease. Counseling  Your health care provider may ask you questions about your:  Alcohol use.  Tobacco use.  Drug use.  Emotional well-being.  Home and relationship well-being.  Sexual activity.  Eating habits.  History of falls.  Memory and ability to understand (cognition).  Work and work Statistician. Screening  You may have the following tests or measurements:  Height, weight, and BMI.  Blood pressure.  Lipid and cholesterol levels. These may be checked every 5 years, or more frequently if you are over 25 years old.  Skin check.  Lung cancer screening. You may have this screening every year starting at age 44 if you have a 30-pack-year history of smoking and currently smoke or have quit within the past 15 years.  Fecal occult blood test (FOBT) of the stool. You may have this test every year starting at age 69.  Flexible sigmoidoscopy or colonoscopy. You may have a sigmoidoscopy every 5 years or a colonoscopy every 10 years starting at age 15.  Prostate cancer screening. Recommendations will vary depending on your family history and other risks.  Hepatitis C blood test.  Hepatitis B blood test.  Sexually transmitted disease (STD) testing.  Diabetes screening. This is done by checking your blood sugar (glucose) after you have not eaten for a while (fasting). You may have this done every 1-3 years.  Abdominal aortic aneurysm (AAA) screening. You may need this if you are a current or former smoker.  Osteoporosis. You may  be screened starting at age 75 if you are at high risk. Talk with your health care provider about your test results, treatment options, and if necessary, the need for more tests. Vaccines  Your health care provider may recommend certain vaccines, such as:  Influenza  vaccine. This is recommended every year.  Tetanus, diphtheria, and acellular pertussis (Tdap, Td) vaccine. You may need a Td booster every 10 years.  Zoster vaccine. You may need this after age 69.  Pneumococcal 13-valent conjugate (PCV13) vaccine. One dose is recommended after age 29.  Pneumococcal polysaccharide (PPSV23) vaccine. One dose is recommended after age 69. Talk to your health care provider about which screenings and vaccines you need and how often you need them. This information is not intended to replace advice given to you by your health care provider. Make sure you discuss any questions you have with your health care provider. Document Released: 07/08/2015 Document Revised: 02/29/2016 Document Reviewed: 04/12/2015 Elsevier Interactive Patient Education  2017 [employer] Prevention in the Home Falls can cause injuries. They can happen to people of all ages. There are many things you can do to make your home safe and to help prevent falls. What can I do on the outside of my home?  Regularly fix the edges of walkways and driveways and fix any cracks.  Remove anything that might make you trip as you walk through a door, such as a raised step or threshold.  Trim any bushes or trees on the path to your home.  Use bright outdoor lighting.  Clear any walking paths of anything that might make someone trip, such as rocks or tools.  Regularly check to see if handrails are loose or broken. Make sure that both sides of any steps have handrails.  Any raised decks and porches should have guardrails on the edges.  Have any leaves, snow, or ice cleared regularly.  Use sand or salt on walking paths during winter.  Clean up any spills in your garage right away. This includes oil or grease spills. What can I do in the bathroom?  Use night lights.  Install grab bars by the toilet and in the tub and shower. Do not use towel bars as grab bars.  Use non-skid mats or decals  in the tub or shower.  If you need to sit down in the shower, use a plastic, non-slip stool.  Keep the floor dry. Clean up any water that spills on the floor as soon as it happens.  Remove soap buildup in the tub or shower regularly.  Attach bath mats securely with double-sided non-slip rug tape.  Do not have throw rugs and other things on the floor that can make you trip. What can I do in the bedroom?  Use night lights.  Make sure that you have a light by your bed that is easy to reach.  Do not use any sheets or blankets that are too big for your bed. They should not hang down onto the floor.  Have a firm chair that has side arms. You can use this for support while you get dressed.  Do not have throw rugs and other things on the floor that can make you trip. What can I do in the kitchen?  Clean up any spills right away.  Avoid walking on wet floors.  Keep items that you use a lot in easy-to-reach places.  If you need to reach something above you, use a strong step stool that has  a grab bar.  Keep electrical cords out of the way.  Do not use floor polish or wax that makes floors slippery. If you must use wax, use non-skid floor wax.  Do not have throw rugs and other things on the floor that can make you trip. What can I do with my stairs?  Do not leave any items on the stairs.  Make sure that there are handrails on both sides of the stairs and use them. Fix handrails that are broken or loose. Make sure that handrails are as long as the stairways.  Check any carpeting to make sure that it is firmly attached to the stairs. Fix any carpet that is loose or worn.  Avoid having throw rugs at the top or bottom of the stairs. If you do have throw rugs, attach them to the floor with carpet tape.  Make sure that you have a light switch at the top of the stairs and the bottom of the stairs. If you do not have them, ask someone to add them for you. What else can I do to help  prevent falls?  Wear shoes that:  Do not have high heels.  Have rubber bottoms.  Are comfortable and fit you well.  Are closed at the toe. Do not wear sandals.  If you use a stepladder:  Make sure that it is fully opened. Do not climb a closed stepladder.  Make sure that both sides of the stepladder are locked into place.  Ask someone to hold it for you, if possible.  Clearly mark and make sure that you can see:  Any grab bars or handrails.  First and last steps.  Where the edge of each step is.  Use tools that help you move around (mobility aids) if they are needed. These include:  Canes.  Walkers.  Scooters.  Crutches.  Turn on the lights when you go into a dark area. Replace any light bulbs as soon as they burn out.  Set up your furniture so you have a clear path. Avoid moving your furniture around.  If any of your floors are uneven, fix them.  If there are any pets around you, be aware of where they are.  Review your medicines with your doctor. Some medicines can make you feel dizzy. This can increase your chance of falling. Ask your doctor what other things that you can do to help prevent falls. This information is not intended to replace advice given to you by your health care provider. Make sure you discuss any questions you have with your health care provider. Document Released: 04/07/2009 Document Revised: 11/17/2015 Document Reviewed: 07/16/2014 Elsevier Interactive Patient Education  2017 [employer].

## 2019-05-06 NOTE — Progress Notes (Signed)
I have reviewed and agree with note, evaluation, plan.   Gredmarie Delange, MD  

## 2019-07-20 ENCOUNTER — Encounter: Payer: Self-pay | Admitting: Family Medicine

## 2019-07-22 ENCOUNTER — Other Ambulatory Visit: Payer: Self-pay

## 2019-07-22 ENCOUNTER — Other Ambulatory Visit (INDEPENDENT_AMBULATORY_CARE_PROVIDER_SITE_OTHER): Payer: Medicare Other

## 2019-07-22 DIAGNOSIS — T380X5A Adverse effect of glucocorticoids and synthetic analogues, initial encounter: Secondary | ICD-10-CM

## 2019-07-22 DIAGNOSIS — R739 Hyperglycemia, unspecified: Secondary | ICD-10-CM

## 2019-07-22 LAB — COMPREHENSIVE METABOLIC PANEL
ALT: 18 U/L (ref 0–53)
AST: 20 U/L (ref 0–37)
Albumin: 3.8 g/dL (ref 3.5–5.2)
Alkaline Phosphatase: 102 U/L (ref 39–117)
BUN: 15 mg/dL (ref 6–23)
CO2: 27 mEq/L (ref 19–32)
Calcium: 9.4 mg/dL (ref 8.4–10.5)
Chloride: 103 mEq/L (ref 96–112)
Creatinine, Ser: 1.16 mg/dL (ref 0.40–1.50)
GFR: 62.26 mL/min (ref >60.00)
Glucose, Bld: 110 mg/dL — ABNORMAL HIGH (ref 70–99)
Potassium: 4.1 mEq/L (ref 3.5–5.1)
Sodium: 137 mEq/L (ref 135–145)
Total Bilirubin: 0.5 mg/dL (ref 0.2–1.2)
Total Protein: 6.5 g/dL (ref 6.0–8.3)

## 2019-07-22 LAB — CBC
HCT: 42.2 % (ref 39.0–52.0)
Hemoglobin: 13.8 g/dL (ref 13.0–17.0)
MCHC: 32.7 g/dL (ref 30.0–36.0)
MCV: 91.9 fl (ref 78.0–100.0)
Platelets: 347 10*3/uL (ref 150.0–400.0)
RBC: 4.59 Mil/uL (ref 4.22–5.81)
RDW: 15.9 % — ABNORMAL HIGH (ref 11.5–15.5)
WBC: 10.6 10*3/uL — ABNORMAL HIGH (ref 4.0–10.5)

## 2019-07-22 LAB — HEMOGLOBIN A1C: Hgb A1c MFr Bld: 6.6 % — ABNORMAL HIGH (ref 4.6–6.5)

## 2019-07-23 ENCOUNTER — Encounter: Payer: Self-pay | Admitting: Family Medicine

## 2019-07-27 ENCOUNTER — Encounter: Payer: Self-pay | Admitting: Family Medicine

## 2019-07-27 ENCOUNTER — Ambulatory Visit (INDEPENDENT_AMBULATORY_CARE_PROVIDER_SITE_OTHER): Payer: Medicare Other | Admitting: Family Medicine

## 2019-07-27 VITALS — BP 127/80 | HR 89 | Ht 70.0 in | Wt 235.0 lb

## 2019-07-27 DIAGNOSIS — D89813 Graft-versus-host disease, unspecified: Secondary | ICD-10-CM

## 2019-07-27 DIAGNOSIS — E2740 Unspecified adrenocortical insufficiency: Secondary | ICD-10-CM

## 2019-07-27 DIAGNOSIS — E785 Hyperlipidemia, unspecified: Secondary | ICD-10-CM | POA: Diagnosis not present

## 2019-07-27 DIAGNOSIS — Z8673 Personal history of transient ischemic attack (TIA), and cerebral infarction without residual deficits: Secondary | ICD-10-CM | POA: Diagnosis not present

## 2019-07-27 DIAGNOSIS — R739 Hyperglycemia, unspecified: Secondary | ICD-10-CM

## 2019-07-27 DIAGNOSIS — Z862 Personal history of diseases of the blood and blood-forming organs and certain disorders involving the immune mechanism: Secondary | ICD-10-CM

## 2019-07-27 DIAGNOSIS — T380X5A Adverse effect of glucocorticoids and synthetic analogues, initial encounter: Secondary | ICD-10-CM | POA: Diagnosis not present

## 2019-07-27 DIAGNOSIS — I10 Essential (primary) hypertension: Secondary | ICD-10-CM | POA: Diagnosis not present

## 2019-07-27 DIAGNOSIS — Z9484 Stem cells transplant status: Secondary | ICD-10-CM

## 2019-07-27 MED ORDER — METFORMIN HCL 500 MG PO TABS: 250.0000 mg | ORAL_TABLET | Freq: Two times a day (BID) | ORAL | 3 refills | Status: DC

## 2019-07-27 NOTE — Patient Instructions (Addendum)
Health Maintenance Due  Topic Date Due  . TETANUS/TDAP -will discuss with transplant doc 12/06/2015   Depression screen [facility] 2/9 05/06/2019 09/24/2018 03/22/2017  Decreased Interest 0 0 0  Down, Depressed, Hopeless 0 0 0  PHQ - 2 Score 0 0 0

## 2019-07-27 NOTE — Progress Notes (Signed)
Phone [phone] Virtual visit via Video note   Subjective:  Chief complaint: Chief Complaint  Patient presents with  . virtual  . F/U on health   This visit type was conducted due to national recommendations for restrictions regarding the COVID-19 Pandemic (e.g. social distancing).  This format is felt to be most appropriate for this patient at this time balancing risks to patient and risks to population by having him in for in person visit.  No physical exam was performed (except for noted visual exam or audio findings with Telehealth visits).    Our team/I connected with Brandon [name] at 11:00 AM EST by a video enabled telemedicine application ([email or web address] or caregility through epic) and verified that I am speaking with the correct person using two identifiers.  Location patient: [place] Location provider: [facility], office Persons participating in the virtual visit:  patient  Our team/I discussed the limitations of evaluation and management by telemedicine and the availability of in person appointments. In light of current covid-19 pandemic, patient also understands that we are trying to protect them by minimizing in office contact if at all possible.  The patient expressed consent for telemedicine visit and agreed to proceed. Patient understands insurance will be billed.   Past Medical History-  Patient Active Problem List   Diagnosis Date Noted  . History of CVA (cerebrovascular accident) 03/10/2019    Priority: High  . Adrenocortical insufficiency ([place]) 08/06/2017    Priority: High  . GVHD (graft versus host disease) ([place]) 03/04/2017    Priority: High  . History of myelodysplastic syndrome 12/06/2015    Priority: High  . Steroid-induced hyperglycemia 08/16/2014    Priority: High  . History of DVT (deep vein thrombosis) 09/25/2018    Priority: Medium  . Post chemotherapy Dry eyes due to decreased tear production 08/06/2017    Priority: Medium  . post chemotherapy  Peripheral neuropathy 08/06/2017    Priority: Medium  . BPH associated with nocturia 11/15/2014    Priority: Medium  . Hyperlipidemia 02/04/2007    Priority: Medium  . Essential hypertension 02/04/2007    Priority: Medium  . History of stem cell transplant ([place]) 03/04/2017    Priority: Low  . Leukopenia 11/15/2014    Priority: Low  . Chest pain 08/13/2014    Priority: Low  . GERD (gastroesophageal reflux disease) 05/03/2014    Priority: Low  . Cervical disc disorder with radiculopathy of cervical region 07/14/2010    Priority: Low  . MIXED HEARING LOSS BILATERAL 07/14/2010    Priority: Low  . ERECTILE DYSFUNCTION 02/07/2007    Priority: Low  . Adrenal crisis ([place]) 11/05/2018    Medications- reviewed and updated Current Outpatient Medications  Medication Sig Dispense Refill  . acyclovir (ZOVIRAX) 400 MG tablet Take 400 mg by mouth 2 (two) times daily.    Marland Kitchen albuterol (PROVENTIL HFA;VENTOLIN HFA) 108 (90 Base) MCG/ACT inhaler Inhale 1-2 puffs into the lungs every 6 (six) hours as needed for wheezing or shortness of breath.     Marland Kitchen amLODipine (NORVASC) 10 MG tablet Take 10 mg by mouth daily.    Marland Kitchen aspirin EC 325 MG tablet Take 81 mg by mouth at bedtime.     . Cholecalciferol (VITAMIN D) 50 MCG (2000 UT) CAPS Take 2,000 Units by mouth at bedtime.     . diphenhydrAMINE HCl (BENADRYL ALLERGY PO) Take by mouth at bedtime as needed.    . famotidine (PEPCID) 20 MG tablet Take 20 mg by mouth daily.    Marland Kitchen  fluticasone (FLONASE) 50 MCG/ACT nasal spray Place 2 sprays into both nostrils daily as needed for allergies or rhinitis.    . fluticasone (FLOVENT HFA) 220 MCG/ACT inhaler Inhale 2 puffs into the lungs 2 (two) times daily.     Marland Kitchen guaiFENesin (MUCINEX PO) Take by mouth.    . hydrocortisone cream 1 % Apply 1 application topically as needed for itching.    . Lancets (ONETOUCH DELICA PLUS [id number]) MISC     . loratadine (CLARITIN) 10 MG tablet Take 10 mg by mouth daily.    Marland Kitchen LORazepam (ATIVAN)  0.5 MG tablet Take 0.5 mg by mouth every 4 (four) hours as needed for anxiety.    . Magnesium Oxide 400 MG CAPS Take 1 capsule (400 mg total) by mouth 5 (five) times daily. (Patient taking differently: Take 400 mg by mouth daily. )    . Melatonin 3 MG TBDP Take by mouth at bedtime as needed.    . metoprolol tartrate (LOPRESSOR) 25 MG tablet Take 1 tablet by mouth 2 (two) times daily.    . montelukast (SINGULAIR) 10 MG tablet Take 10 mg by mouth daily.     . Multiple Vitamin (MULTI-VITAMIN) tablet Take by mouth.    Marland Kitchen omeprazole (PRILOSEC) 40 MG capsule Take 40 mg by mouth 2 (two) times daily.    . ondansetron (ZOFRAN-ODT) 8 MG disintegrating tablet Take 8 mg by mouth every 8 (eight) hours as needed for nausea or vomiting.    . Polyethylene Glycol 3350 (MIRALAX PO) Take by mouth.    . polyvinyl alcohol (LIQUIFILM TEARS) 1.4 % ophthalmic solution Place 1 drop into both eyes as needed for dry eyes.    . rosuvastatin (CRESTOR) 10 MG tablet TAKE 1 TABLET BY MOUTH EVERY DAY AT NIGHT    . senna-docusate (SENOKOT-S) 8.6-50 MG tablet Take 2 tablets by mouth at bedtime.     . metFORMIN (GLUCOPHAGE) 500 MG tablet Take 0.5 tablets (250 mg total) by mouth 2 (two) times daily with a meal. 90 tablet 3   No current facility-administered medications for this visit.     Objective:  BP 127/80   Pulse 89   Ht 5\' 10"  (1.778 m)   Wt 235 lb (106.6 kg)   BMI 33.72 kg/m  self reported vitals Gen: NAD, resting comfortably, appears to have gained some weight per his face Lungs: nonlabored, normal respiratory rate  Skin: appears dry, no obvious rash    Assessment and Plan   # Vaccination tomorrow with [facility] for covid 19.   #Chronic cough-remains on reflux and allergy medication.  He feels like it could be related to his sinuses still- post nasal drip despite OTC antihistamine and flonase- also on singulair for GVHD of lung prophylaxis and 2 inhalers . Has had imaging on multiple occasions.  Have given codeine in  the past for as-needed basis.  Denies recent worsening.  #History of CVA in September 2020 and was not TPA candidate as outside of window -saw Dr. Burr Medico at [facility] on July 22, 2019 reviewed today -platelet inhibition test was completed and patient was allowed to stay on aspirin 325 mg-previously there had been some discussion about switching to Plavix but patient would have to come off posaconazole and we have wanted to avoid that.  With results of platelet inhibition test-patient does not need to switch to Plavix anyway.  Patient also is on Crestor 10 mg Po stroke.  He will be continued on aspirin and statin. -No residual symptoms from stroke  #  hyperlipidemia S: compliant with rosuvastatin 10 mg  A/P: Excellent control in October with LDL of 55-continue current medications.  Liver function tests in January looked excellent.    #Steroid-induced hyperglycemia due to steroids for adrenocortical insufficiency S: Glipizide previously stopped by one of his [place]- he has remained off.  Hydrocortisone level currently at 15 mg(last visit was down to 25 mg from 30 mg- now down to 15mg  from 20mg  recently). Still with some intermittent nausea.  CBGs- averaging 110 in AM Lab Results  Component Value Date   HGBA1C 6.6 (H) 07/22/2019   HGBA1C 6.1 (H) 04/07/2019   HGBA1C 8.7 (H) 01/15/2019   A/P: Excellent control without medication-continue to monitor. We did opt to try metformin 250mg  before breakfast to see if that can help with weight gain- less so for sugar modulation. Can use second dose if he desires and tolerates AM dose- can take in PM - encouraged to watch out for low blood sugars though uncommon  #hypertension S: compliant with amlodipine 10 mg plus metoprolol 25mg  BID BP Readings from Last 3 Encounters:  07/27/19 127/80  04/17/19 129/75  03/10/19 122/77  A/P: Stable. Continue current medications.   # mild leukocytosis- lower than prior #s- continue to monitor  #GVHD- continues to  follow closely with [facility]. On 2 inhalers and singulair for prophylaxis for lung issues. He is s/p stem cell transplant for prior myeodysplastic syndrome  # exercise goal- wants to work back up to 2 miles a day- he is wanting to get stronger. Has been able to get up to 3/4 of a mile.    Recommended follow up: 4 month follow up  Lab/Order associations:   ICD-10-CM   1. Steroid-induced hyperglycemia  R73.9    T38.0X5A   2. History of CVA (cerebrovascular accident)  Z86.73   3. Hyperlipidemia, unspecified hyperlipidemia type  E78.5   4. Essential hypertension  I10   5. Adrenocortical insufficiency (H[facility])  E27.40   6. History of myelodysplastic syndrome  Z86.2   7. History of stem cell transplant ([place]) Chronic Z94.84   8. GVHD (graft versus host disease) ([place]) Chronic D89.813     Meds ordered this encounter  Medications  . metFORMIN (GLUCOPHAGE) 500 MG tablet    Sig: Take 0.5 tablets (250 mg total) by mouth 2 (two) times daily with a meal.    Dispense:  90 tablet    Refill:  3   Return precautions advised.  Garret Reddish, MD

## 2019-09-03 ENCOUNTER — Inpatient Hospital Stay (HOSPITAL_COMMUNITY)
Admission: EM | Admit: 2019-09-03 | Discharge: 2019-09-08 | DRG: 644 | Disposition: A | Discharge status: Home / Self Care | Payer: Medicare Other | Attending: Internal Medicine | Admitting: Internal Medicine

## 2019-09-03 ENCOUNTER — Other Ambulatory Visit: Payer: Self-pay

## 2019-09-03 ENCOUNTER — Encounter (HOSPITAL_COMMUNITY): Payer: Self-pay | Admitting: Emergency Medicine

## 2019-09-03 DIAGNOSIS — R059 Cough, unspecified: Secondary | ICD-10-CM

## 2019-09-03 DIAGNOSIS — I10 Essential (primary) hypertension: Secondary | ICD-10-CM | POA: Diagnosis present

## 2019-09-03 DIAGNOSIS — Z8673 Personal history of transient ischemic attack (TIA), and cerebral infarction without residual deficits: Secondary | ICD-10-CM

## 2019-09-03 DIAGNOSIS — E2740 Unspecified adrenocortical insufficiency: Secondary | ICD-10-CM | POA: Diagnosis present

## 2019-09-03 DIAGNOSIS — R05 Cough: Secondary | ICD-10-CM

## 2019-09-03 DIAGNOSIS — H906 Mixed conductive and sensorineural hearing loss, bilateral: Secondary | ICD-10-CM | POA: Diagnosis present

## 2019-09-03 DIAGNOSIS — Z820 Family history of epilepsy and other diseases of the nervous system: Secondary | ICD-10-CM

## 2019-09-03 DIAGNOSIS — G620 Drug-induced polyneuropathy: Secondary | ICD-10-CM | POA: Diagnosis present

## 2019-09-03 DIAGNOSIS — Z7982 Long term (current) use of aspirin: Secondary | ICD-10-CM

## 2019-09-03 DIAGNOSIS — K219 Gastro-esophageal reflux disease without esophagitis: Secondary | ICD-10-CM | POA: Diagnosis present

## 2019-09-03 DIAGNOSIS — Z862 Personal history of diseases of the blood and blood-forming organs and certain disorders involving the immune mechanism: Secondary | ICD-10-CM

## 2019-09-03 DIAGNOSIS — Z86718 Personal history of other venous thrombosis and embolism: Secondary | ICD-10-CM

## 2019-09-03 DIAGNOSIS — Z7952 Long term (current) use of systemic steroids: Secondary | ICD-10-CM

## 2019-09-03 DIAGNOSIS — R112 Nausea with vomiting, unspecified: Secondary | ICD-10-CM | POA: Diagnosis present

## 2019-09-03 DIAGNOSIS — E875 Hyperkalemia: Secondary | ICD-10-CM | POA: Diagnosis present

## 2019-09-03 DIAGNOSIS — E871 Hypo-osmolality and hyponatremia: Secondary | ICD-10-CM | POA: Diagnosis present

## 2019-09-03 DIAGNOSIS — D631 Anemia in chronic kidney disease: Secondary | ICD-10-CM | POA: Diagnosis present

## 2019-09-03 DIAGNOSIS — E272 Addisonian crisis: Principal | ICD-10-CM | POA: Diagnosis present

## 2019-09-03 DIAGNOSIS — Z823 Family history of stroke: Secondary | ICD-10-CM

## 2019-09-03 DIAGNOSIS — E785 Hyperlipidemia, unspecified: Secondary | ICD-10-CM | POA: Diagnosis present

## 2019-09-03 DIAGNOSIS — T50995A Adverse effect of other drugs, medicaments and biological substances, initial encounter: Secondary | ICD-10-CM | POA: Diagnosis present

## 2019-09-03 DIAGNOSIS — Z79899 Other long term (current) drug therapy: Secondary | ICD-10-CM

## 2019-09-03 DIAGNOSIS — Z7951 Long term (current) use of inhaled steroids: Secondary | ICD-10-CM

## 2019-09-03 DIAGNOSIS — D89813 Graft-versus-host disease, unspecified: Secondary | ICD-10-CM | POA: Diagnosis present

## 2019-09-03 DIAGNOSIS — N179 Acute kidney failure, unspecified: Secondary | ICD-10-CM | POA: Diagnosis present

## 2019-09-03 DIAGNOSIS — N3090 Cystitis, unspecified without hematuria: Secondary | ICD-10-CM | POA: Diagnosis present

## 2019-09-03 DIAGNOSIS — Z803 Family history of malignant neoplasm of breast: Secondary | ICD-10-CM

## 2019-09-03 DIAGNOSIS — Z9221 Personal history of antineoplastic chemotherapy: Secondary | ICD-10-CM

## 2019-09-03 DIAGNOSIS — T380X5A Adverse effect of glucocorticoids and synthetic analogues, initial encounter: Secondary | ICD-10-CM | POA: Diagnosis present

## 2019-09-03 DIAGNOSIS — E0965 Drug or chemical induced diabetes mellitus with hyperglycemia: Secondary | ICD-10-CM | POA: Diagnosis present

## 2019-09-03 DIAGNOSIS — E669 Obesity, unspecified: Secondary | ICD-10-CM | POA: Diagnosis present

## 2019-09-03 DIAGNOSIS — M4856XA Collapsed vertebra, not elsewhere classified, lumbar region, initial encounter for fracture: Secondary | ICD-10-CM | POA: Diagnosis present

## 2019-09-03 DIAGNOSIS — E860 Dehydration: Secondary | ICD-10-CM | POA: Diagnosis present

## 2019-09-03 DIAGNOSIS — Z7984 Long term (current) use of oral hypoglycemic drugs: Secondary | ICD-10-CM

## 2019-09-03 DIAGNOSIS — I129 Hypertensive chronic kidney disease with stage 1 through stage 4 chronic kidney disease, or unspecified chronic kidney disease: Secondary | ICD-10-CM | POA: Diagnosis present

## 2019-09-03 DIAGNOSIS — Z9484 Stem cells transplant status: Secondary | ICD-10-CM

## 2019-09-03 DIAGNOSIS — Z6833 Body mass index (BMI) 33.0-33.9, adult: Secondary | ICD-10-CM

## 2019-09-03 DIAGNOSIS — N182 Chronic kidney disease, stage 2 (mild): Secondary | ICD-10-CM | POA: Diagnosis present

## 2019-09-03 DIAGNOSIS — Z8701 Personal history of pneumonia (recurrent): Secondary | ICD-10-CM

## 2019-09-03 DIAGNOSIS — E0922 Drug or chemical induced diabetes mellitus with diabetic chronic kidney disease: Secondary | ICD-10-CM | POA: Diagnosis present

## 2019-09-03 DIAGNOSIS — Z91048 Other nonmedicinal substance allergy status: Secondary | ICD-10-CM

## 2019-09-03 DIAGNOSIS — Z20822 Contact with and (suspected) exposure to covid-19: Secondary | ICD-10-CM | POA: Diagnosis present

## 2019-09-03 LAB — COMPREHENSIVE METABOLIC PANEL
ALT: 22 U/L (ref 0–44)
AST: 30 U/L (ref 15–41)
Albumin: 4.2 g/dL (ref 3.5–5.0)
Alkaline Phosphatase: 114 U/L (ref 38–126)
Anion gap: 13 (ref 5–15)
BUN: 20 mg/dL (ref 8–23)
CO2: 22 mmol/L (ref 22–32)
Calcium: 9.5 mg/dL (ref 8.9–10.3)
Chloride: 106 mmol/L (ref 98–111)
Creatinine, Ser: 1.54 mg/dL — ABNORMAL HIGH (ref 0.61–1.24)
GFR calc Af Amer: 52 mL/min — ABNORMAL LOW (ref >60)
GFR calc non Af Amer: 45 mL/min — ABNORMAL LOW (ref >60)
Glucose, Bld: 145 mg/dL — ABNORMAL HIGH (ref 70–99)
Potassium: 4.7 mmol/L (ref 3.5–5.1)
Sodium: 141 mmol/L (ref 135–145)
Total Bilirubin: 0.8 mg/dL (ref 0.3–1.2)
Total Protein: 7.9 g/dL (ref 6.5–8.1)

## 2019-09-03 LAB — CBC
HCT: 50.4 % (ref 39.0–52.0)
Hemoglobin: 16 g/dL (ref 13.0–17.0)
MCH: 29 pg (ref 26.0–34.0)
MCHC: 31.7 g/dL (ref 30.0–36.0)
MCV: 91.5 fL (ref 80.0–100.0)
Platelets: 428 10*3/uL — ABNORMAL HIGH (ref 150–400)
RBC: 5.51 MIL/uL (ref 4.22–5.81)
RDW: 15.3 % (ref 11.5–15.5)
WBC: 17.8 10*3/uL — ABNORMAL HIGH (ref 4.0–10.5)
nRBC: 0 % (ref 0.0–0.2)

## 2019-09-03 LAB — LIPASE, BLOOD: Lipase: 22 U/L (ref 11–51)

## 2019-09-03 MED ORDER — HYDROCORTISONE NA SUCCINATE PF 100 MG IJ SOLR
100.0000 mg | Freq: Once | INTRAMUSCULAR | Status: AC
  Administered 2019-09-03: 100 mg via INTRAVENOUS
  Filled 2019-09-03: qty 2

## 2019-09-03 MED ORDER — SODIUM CHLORIDE 0.9% FLUSH: 3.0000 mL | Freq: Once | INTRAVENOUS | Status: DC

## 2019-09-03 MED ORDER — SODIUM CHLORIDE 0.9 % IV BOLUS
1000.0000 mL | Freq: Once | INTRAVENOUS | Status: AC
  Administered 2019-09-03: 1000 mL via INTRAVENOUS

## 2019-09-03 MED ORDER — SODIUM CHLORIDE 0.9 % IV SOLN: INTRAVENOUS | Status: DC

## 2019-09-03 MED ORDER — ONDANSETRON HCL 4 MG/2ML IJ SOLN
4.0000 mg | Freq: Once | INTRAMUSCULAR | Status: AC
  Administered 2019-09-03: 4 mg via INTRAVENOUS
  Filled 2019-09-03: qty 2

## 2019-09-03 NOTE — ED Provider Notes (Signed)
[facility] Emergency Department Provider Note MRN:  [id number]  Arrival date & time: 09/03/19     Chief Complaint   Emesis and Nausea   History of Present Illness   Brandon [name] is a 69 y.o. year-old male with a history of adrenal insufficiency presenting to the ED with chief complaint of emesis.  Persistent nonbloody nonbilious emesis since this afternoon.  Denies abdominal pain, no chest pain, occasional chills for the past couple hours.  Unable to take his home steroids.  Review of Systems  A complete 10 system review of systems was obtained and all systems are negative except as noted in the HPI and PMH.   Patient's Health History    Past Medical History:  Diagnosis Date  . Adrenocortical insufficiency ([place]) 08/06/2017   Patient with 2 hospitalizations in January 2019- treated with fluids, antibiotics, steroids- ultimately thought most likely due to adrenal insufficiency with no obvious source of infection found  . Allergy    seasonal/animals  . Chronic prostatitis 12/17/2008  . Diverticulosis 01/16/2011   History of diverticulitis   . ERECTILE DYSFUNCTION 02/07/2007  . HYPERLIPIDEMIA 02/04/2007  . HYPERTENSION 02/04/2007  . NEOPLASM, MALIGNANT, SKIN, BACK 02/09/2009  . Pneumonia, viral 12/2017  . S/P bone marrow transplant [facility])     Past Surgical History:  Procedure Laterality Date  . CATARACT EXTRACTION Left 06/2018  . none      Family History  Problem Relation Age of Onset  . Breast cancer Mother   . CVA Mother        quit taking HTN meds  . Alzheimer's disease Father        late 31s. mid 65s  . Asperger's syndrome Son     Social History   Socioeconomic History  . Marital status: Married    Spouse name: Not on file  . Number of children: Not on file  . Years of education: Not on file  . Highest education level: Not on file  Occupational History  . Not on file  Tobacco Use  . Smoking status: Never Smoker  . Smokeless tobacco:  Never Used  Substance and Sexual Activity  . Alcohol use: Yes    Alcohol/week: 3.0 standard drinks    Types: 3 Standard drinks or equivalent per week  . Drug use: No  . Sexual activity: Yes  Other Topics Concern  . Not on file  Social History Narrative   Married 1973. Wife has Parkinsons. 2 daughters, 1 son (middle). 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 Difficulty of Paying Living Expenses: Not hard at all  Food Insecurity: No Food Insecurity  . Worried About Charity fundraiser in the Last Year: Never true  . Ran Out of Food in the Last Year: Never true  Transportation Needs: No Transportation Needs  . Lack of Transportation (Medical): No  . Lack of Transportation (Non-Medical): No  Physical Activity: Unknown  . Days of Exercise per Week: Patient refused  . Minutes of Exercise per Session: Patient refused  Stress:   . Feeling of Stress :   Social Connections: Somewhat Isolated  . Frequency of Communication with Friends and Family: Twice a week  . Frequency of Social Gatherings with Friends and Family: Never  . Attends Religious Services: More than  4 times per year  . Active Member of Clubs or Organizations: No  . Attends Archivist Meetings: Never  . Marital Status: Married  Human resources officer Violence: Unknown  . Fear of Current or Ex-Partner: Patient refused  . Emotionally Abused: Patient refused  . Physically Abused: Patient refused  . Sexually Abused: Patient refused     Physical Exam   Vitals:   09/03/19 2100 09/03/19 2200  BP: 128/76 108/64  Pulse: (!) 121 (!) 119  Resp: (!) 25 (!) 28  Temp:    SpO2: 90% 91%    CONSTITUTIONAL: Ill-appearing, appears uncomfortable NEURO:  Alert and oriented x 3, no focal deficits EYES:  eyes equal and reactive ENT/NECK:  no  LAD, no JVD CARDIO: Tachycardic rate, well-perfused, normal S1 and S2 PULM:  CTAB no wheezing or rhonchi GI/GU:  normal bowel sounds, non-distended, non-tender MSK/SPINE:  No gross deformities, no edema SKIN:  no rash, atraumatic PSYCH:  Appropriate speech and behavior  *Additional and/or pertinent findings included in MDM below  Diagnostic and Interventional Summary    EKG Interpretation  Date/Time:  09/03/2019 at 19: 51: 35 Ventricular Rate:  126 PR Interval:    QRS Duration: 79 QT Interval:  277 QTC Calculation: 401 R Axis:     Text Interpretation: Sinus tachycardia, nonspecific ST findings, baseline artifact V1 and V2 Confirmed by Dr. Gerlene Fee at 10:14 PM      Labs Reviewed  COMPREHENSIVE METABOLIC PANEL - Abnormal; Notable for the following components:      Result Value   Glucose, Bld 145 (*)    Creatinine, Ser 1.54 (*)    GFR calc non Af Amer 45 (*)    GFR calc Af Amer 52 (*)    All other components within normal limits  CBC - Abnormal; Notable for the following components:   WBC 17.8 (*)    Platelets 428 (*)    All other components within normal limits  SARS CORONAVIRUS 2 (TAT 6-24 HRS)  LIPASE, BLOOD  URINALYSIS, ROUTINE W REFLEX MICROSCOPIC    No orders to display    Medications  sodium chloride flush (NS) 0.9 % injection 3 mL (3 mLs Intravenous Not Given 09/03/19 2001)  hydrocortisone sodium succinate (SOLU-CORTEF) 100 MG injection 100 mg (100 mg Intravenous Given 09/03/19 2004)  sodium chloride 0.9 % bolus 1,000 mL (0 mLs Intravenous Stopped 09/03/19 2130)  sodium chloride 0.9 % bolus 1,000 mL (0 mLs Intravenous Stopped 09/03/19 2130)  ondansetron (ZOFRAN) injection 4 mg (4 mg Intravenous Given 09/03/19 2004)     Procedures  /  Critical Care .Critical Care Performed by: Maudie Flakes, MD Authorized by: Maudie Flakes, MD   Critical care provider statement:    Critical care time (minutes):  35   Critical care was necessary to treat or prevent  imminent or life-threatening deterioration of the following conditions: Adrenal crisis.   Critical care was time spent personally by me on the following activities:  Discussions with consultants, evaluation of patient's response to treatment, examination of patient, ordering and performing treatments and interventions, ordering and review of laboratory studies, ordering and review of radiographic studies, pulse oximetry, re-evaluation of patient's condition, obtaining history from patient or surrogate and review of old charts    ED Course and Medical Decision Making  I have reviewed the triage vital signs, the nursing notes, and pertinent available records from the EMR.  Pertinent labs & imaging results that were available during my care of the patient were reviewed  by me and considered in my medical decision making (see below for details).     History of similar presentations due to adrenal insufficiency, tachycardic, normotensive, looks unwell, will provide fluids, stress dose steroids, nausea medicine, reassess.  Currently without indication for abdominal imaging.  10 PM update: Patient feeling better after IV antiemetics, 2 L IV fluids, IV hydrocortisone.  Still tachycardic up to 118 on my reevaluation, abdomen continues to be soft and nontender.  Admitted to hospital service for further care.  Barth Kirks. Sedonia Small, [place] [email or web address]  Final Clinical Impressions(s) / ED Diagnoses     ICD-10-CM   1. Adrenal crisis ([place])  E27.2   2. Nausea and vomiting, intractability of vomiting not specified, unspecified vomiting type  R11.2     ED Discharge Orders    None       Discharge Instructions Discussed with and Provided to Patient:   Discharge Instructions   None       Maudie Flakes, MD 09/03/19 2214

## 2019-09-03 NOTE — ED Triage Notes (Signed)
Patient presents with nausea and vomiting since 1630 today after eating a snack. He has been vomiting since. He also complains of chills. Around 1730 the patient took Zofran and then Cortisone. Patient vomited again.  He reports having a similar situation in the past.

## 2019-09-04 ENCOUNTER — Observation Stay (HOSPITAL_COMMUNITY): Payer: Medicare Other

## 2019-09-04 ENCOUNTER — Encounter: Payer: Self-pay | Admitting: Family Medicine

## 2019-09-04 ENCOUNTER — Encounter (HOSPITAL_COMMUNITY): Payer: Self-pay | Admitting: Internal Medicine

## 2019-09-04 ENCOUNTER — Inpatient Hospital Stay (HOSPITAL_COMMUNITY)
Admit: 2019-09-04 | Discharge: 2019-09-04 | Disposition: A | Discharge status: Home / Self Care | Payer: Medicare Other | Attending: Internal Medicine | Admitting: Internal Medicine

## 2019-09-04 DIAGNOSIS — Z7952 Long term (current) use of systemic steroids: Secondary | ICD-10-CM | POA: Diagnosis not present

## 2019-09-04 DIAGNOSIS — R112 Nausea with vomiting, unspecified: Secondary | ICD-10-CM | POA: Diagnosis not present

## 2019-09-04 DIAGNOSIS — N179 Acute kidney failure, unspecified: Secondary | ICD-10-CM | POA: Diagnosis present

## 2019-09-04 DIAGNOSIS — M7989 Other specified soft tissue disorders: Secondary | ICD-10-CM

## 2019-09-04 DIAGNOSIS — E2740 Unspecified adrenocortical insufficiency: Secondary | ICD-10-CM | POA: Diagnosis not present

## 2019-09-04 DIAGNOSIS — Z7951 Long term (current) use of inhaled steroids: Secondary | ICD-10-CM | POA: Diagnosis not present

## 2019-09-04 DIAGNOSIS — E860 Dehydration: Secondary | ICD-10-CM | POA: Diagnosis present

## 2019-09-04 DIAGNOSIS — M4856XA Collapsed vertebra, not elsewhere classified, lumbar region, initial encounter for fracture: Secondary | ICD-10-CM | POA: Diagnosis present

## 2019-09-04 DIAGNOSIS — M8440XA Pathological fracture, unspecified site, initial encounter for fracture: Secondary | ICD-10-CM | POA: Diagnosis not present

## 2019-09-04 DIAGNOSIS — G620 Drug-induced polyneuropathy: Secondary | ICD-10-CM | POA: Diagnosis present

## 2019-09-04 DIAGNOSIS — D631 Anemia in chronic kidney disease: Secondary | ICD-10-CM | POA: Diagnosis present

## 2019-09-04 DIAGNOSIS — Z9481 Bone marrow transplant status: Secondary | ICD-10-CM | POA: Diagnosis not present

## 2019-09-04 DIAGNOSIS — Z86718 Personal history of other venous thrombosis and embolism: Secondary | ICD-10-CM

## 2019-09-04 DIAGNOSIS — Z862 Personal history of diseases of the blood and blood-forming organs and certain disorders involving the immune mechanism: Secondary | ICD-10-CM | POA: Diagnosis not present

## 2019-09-04 DIAGNOSIS — R52 Pain, unspecified: Secondary | ICD-10-CM

## 2019-09-04 DIAGNOSIS — I10 Essential (primary) hypertension: Secondary | ICD-10-CM

## 2019-09-04 DIAGNOSIS — E785 Hyperlipidemia, unspecified: Secondary | ICD-10-CM | POA: Diagnosis present

## 2019-09-04 DIAGNOSIS — Z20822 Contact with and (suspected) exposure to covid-19: Secondary | ICD-10-CM | POA: Diagnosis present

## 2019-09-04 DIAGNOSIS — E272 Addisonian crisis: Secondary | ICD-10-CM | POA: Diagnosis present

## 2019-09-04 DIAGNOSIS — N182 Chronic kidney disease, stage 2 (mild): Secondary | ICD-10-CM | POA: Diagnosis present

## 2019-09-04 DIAGNOSIS — E871 Hypo-osmolality and hyponatremia: Secondary | ICD-10-CM | POA: Diagnosis present

## 2019-09-04 DIAGNOSIS — H906 Mixed conductive and sensorineural hearing loss, bilateral: Secondary | ICD-10-CM | POA: Diagnosis present

## 2019-09-04 DIAGNOSIS — K219 Gastro-esophageal reflux disease without esophagitis: Secondary | ICD-10-CM | POA: Diagnosis present

## 2019-09-04 DIAGNOSIS — I129 Hypertensive chronic kidney disease with stage 1 through stage 4 chronic kidney disease, or unspecified chronic kidney disease: Secondary | ICD-10-CM | POA: Diagnosis present

## 2019-09-04 DIAGNOSIS — T380X5A Adverse effect of glucocorticoids and synthetic analogues, initial encounter: Secondary | ICD-10-CM | POA: Diagnosis present

## 2019-09-04 DIAGNOSIS — N3090 Cystitis, unspecified without hematuria: Secondary | ICD-10-CM | POA: Diagnosis present

## 2019-09-04 DIAGNOSIS — T50995A Adverse effect of other drugs, medicaments and biological substances, initial encounter: Secondary | ICD-10-CM | POA: Diagnosis present

## 2019-09-04 DIAGNOSIS — D469 Myelodysplastic syndrome, unspecified: Secondary | ICD-10-CM | POA: Diagnosis not present

## 2019-09-04 DIAGNOSIS — E669 Obesity, unspecified: Secondary | ICD-10-CM | POA: Diagnosis present

## 2019-09-04 DIAGNOSIS — E0965 Drug or chemical induced diabetes mellitus with hyperglycemia: Secondary | ICD-10-CM | POA: Diagnosis present

## 2019-09-04 DIAGNOSIS — D89813 Graft-versus-host disease, unspecified: Secondary | ICD-10-CM | POA: Diagnosis present

## 2019-09-04 DIAGNOSIS — E0922 Drug or chemical induced diabetes mellitus with diabetic chronic kidney disease: Secondary | ICD-10-CM | POA: Diagnosis present

## 2019-09-04 DIAGNOSIS — Z9484 Stem cells transplant status: Secondary | ICD-10-CM | POA: Diagnosis not present

## 2019-09-04 DIAGNOSIS — E875 Hyperkalemia: Secondary | ICD-10-CM | POA: Diagnosis present

## 2019-09-04 LAB — CBC
HCT: 36.2 % — ABNORMAL LOW (ref 39.0–52.0)
Hemoglobin: 11.9 g/dL — ABNORMAL LOW (ref 13.0–17.0)
MCH: 30.1 pg (ref 26.0–34.0)
MCHC: 32.9 g/dL (ref 30.0–36.0)
MCV: 91.4 fL (ref 80.0–100.0)
Platelets: 301 10*3/uL (ref 150–400)
RBC: 3.96 MIL/uL — ABNORMAL LOW (ref 4.22–5.81)
RDW: 15.2 % (ref 11.5–15.5)
WBC: 13.1 10*3/uL — ABNORMAL HIGH (ref 4.0–10.5)
nRBC: 0 % (ref 0.0–0.2)

## 2019-09-04 LAB — BASIC METABOLIC PANEL
Anion gap: 9 (ref 5–15)
BUN: 26 mg/dL — ABNORMAL HIGH (ref 8–23)
CO2: 21 mmol/L — ABNORMAL LOW (ref 22–32)
Calcium: 7.8 mg/dL — ABNORMAL LOW (ref 8.9–10.3)
Chloride: 107 mmol/L (ref 98–111)
Creatinine, Ser: 1.42 mg/dL — ABNORMAL HIGH (ref 0.61–1.24)
GFR calc Af Amer: 58 mL/min — ABNORMAL LOW (ref >60)
GFR calc non Af Amer: 50 mL/min — ABNORMAL LOW (ref >60)
Glucose, Bld: 134 mg/dL — ABNORMAL HIGH (ref 70–99)
Potassium: 4.7 mmol/L (ref 3.5–5.1)
Sodium: 137 mmol/L (ref 135–145)

## 2019-09-04 LAB — LACTIC ACID, PLASMA: Lactic Acid, Venous: 1.6 mmol/L (ref 0.5–1.9)

## 2019-09-04 LAB — URINALYSIS, ROUTINE W REFLEX MICROSCOPIC
Bilirubin Urine: NEGATIVE
Glucose, UA: NEGATIVE mg/dL
Hgb urine dipstick: NEGATIVE
Ketones, ur: 20 mg/dL — AB
Leukocytes,Ua: NEGATIVE
Nitrite: NEGATIVE
Protein, ur: NEGATIVE mg/dL
Specific Gravity, Urine: 1.017 (ref 1.005–1.030)
pH: 5 (ref 5.0–8.0)

## 2019-09-04 LAB — GLUCOSE, CAPILLARY
Glucose-Capillary: 82 mg/dL (ref 70–99)
Glucose-Capillary: 86 mg/dL (ref 70–99)

## 2019-09-04 LAB — SARS CORONAVIRUS 2 (TAT 6-24 HRS): SARS Coronavirus 2: NEGATIVE

## 2019-09-04 LAB — CBG MONITORING, ED: Glucose-Capillary: 121 mg/dL — ABNORMAL HIGH (ref 70–99)

## 2019-09-04 LAB — PROCALCITONIN: Procalcitonin: 27.39 ng/mL

## 2019-09-04 MED ORDER — ACYCLOVIR 400 MG PO TABS
400.0000 mg | ORAL_TABLET | Freq: Two times a day (BID) | ORAL | Status: DC
  Administered 2019-09-04 – 2019-09-08 (×9): 400 mg via ORAL
  Filled 2019-09-04 (×11): qty 1

## 2019-09-04 MED ORDER — ASPIRIN EC 81 MG PO TBEC
81.0000 mg | DELAYED_RELEASE_TABLET | Freq: Every day | ORAL | Status: DC
  Administered 2019-09-04 – 2019-09-07 (×4): 81 mg via ORAL
  Filled 2019-09-04 (×4): qty 1

## 2019-09-04 MED ORDER — AMLODIPINE BESYLATE 5 MG PO TABS: 10.0000 mg | ORAL_TABLET | Freq: Every day | ORAL | Status: DC

## 2019-09-04 MED ORDER — ONDANSETRON HCL 4 MG PO TABS: 4.0000 mg | ORAL_TABLET | Freq: Four times a day (QID) | ORAL | Status: DC | PRN

## 2019-09-04 MED ORDER — METOPROLOL TARTRATE 25 MG PO TABS
25.0000 mg | ORAL_TABLET | Freq: Two times a day (BID) | ORAL | Status: DC
  Administered 2019-09-04 – 2019-09-08 (×9): 25 mg via ORAL
  Filled 2019-09-04 (×10): qty 1

## 2019-09-04 MED ORDER — ENOXAPARIN SODIUM 40 MG/0.4ML ~~LOC~~ SOLN
40.0000 mg | SUBCUTANEOUS | Status: DC
  Administered 2019-09-04 – 2019-09-08 (×5): 40 mg via SUBCUTANEOUS
  Filled 2019-09-04 (×5): qty 0.4

## 2019-09-04 MED ORDER — MAGNESIUM OXIDE 400 (241.3 MG) MG PO TABS
400.0000 mg | ORAL_TABLET | Freq: Every day | ORAL | Status: DC
  Administered 2019-09-04 – 2019-09-08 (×5): 400 mg via ORAL
  Filled 2019-09-04 (×5): qty 1

## 2019-09-04 MED ORDER — ONDANSETRON HCL 4 MG/2ML IJ SOLN
4.0000 mg | Freq: Four times a day (QID) | INTRAMUSCULAR | Status: DC | PRN
  Administered 2019-09-05: 4 mg via INTRAVENOUS
  Filled 2019-09-04: qty 2

## 2019-09-04 MED ORDER — SENNOSIDES-DOCUSATE SODIUM 8.6-50 MG PO TABS
2.0000 | ORAL_TABLET | Freq: Every day | ORAL | Status: DC
  Administered 2019-09-04 – 2019-09-07 (×5): 2 via ORAL
  Filled 2019-09-04 (×5): qty 2

## 2019-09-04 MED ORDER — MONTELUKAST SODIUM 10 MG PO TABS
10.0000 mg | ORAL_TABLET | Freq: Every day | ORAL | Status: DC
  Administered 2019-09-04 – 2019-09-08 (×5): 10 mg via ORAL
  Filled 2019-09-04 (×5): qty 1

## 2019-09-04 MED ORDER — VITAMIN D 25 MCG (1000 UNIT) PO TABS
2000.0000 [IU] | ORAL_TABLET | Freq: Every day | ORAL | Status: DC
  Administered 2019-09-04 – 2019-09-07 (×4): 2000 [IU] via ORAL
  Filled 2019-09-04 (×4): qty 2

## 2019-09-04 MED ORDER — ACETAMINOPHEN 325 MG PO TABS: 650.0000 mg | ORAL_TABLET | Freq: Four times a day (QID) | ORAL | Status: DC | PRN

## 2019-09-04 MED ORDER — GUAIFENESIN ER 600 MG PO TB12
600.0000 mg | ORAL_TABLET | Freq: Two times a day (BID) | ORAL | Status: DC
  Administered 2019-09-04 – 2019-09-08 (×10): 600 mg via ORAL
  Filled 2019-09-04 (×10): qty 1

## 2019-09-04 MED ORDER — ALBUTEROL SULFATE (2.5 MG/3ML) 0.083% IN NEBU: 2.5000 mg | INHALATION_SOLUTION | Freq: Four times a day (QID) | RESPIRATORY_TRACT | Status: DC | PRN

## 2019-09-04 MED ORDER — HYDROCORTISONE NA SUCCINATE PF 100 MG IJ SOLR
50.0000 mg | Freq: Three times a day (TID) | INTRAMUSCULAR | Status: DC
  Administered 2019-09-04: 50 mg via INTRAVENOUS
  Filled 2019-09-04: qty 2

## 2019-09-04 MED ORDER — HYDROCORTISONE 10 MG PO TABS: 10.0000 mg | ORAL_TABLET | Freq: Two times a day (BID) | ORAL | Status: DC

## 2019-09-04 MED ORDER — HYDROCORTISONE 20 MG PO TABS
20.0000 mg | ORAL_TABLET | Freq: Two times a day (BID) | ORAL | Status: DC
  Administered 2019-09-04 – 2019-09-08 (×8): 20 mg via ORAL
  Filled 2019-09-04 (×10): qty 1

## 2019-09-04 MED ORDER — FLUTICASONE PROPIONATE HFA 220 MCG/ACT IN AERO: 2.0000 | INHALATION_SPRAY | Freq: Two times a day (BID) | RESPIRATORY_TRACT | Status: DC

## 2019-09-04 MED ORDER — ROSUVASTATIN CALCIUM 10 MG PO TABS
10.0000 mg | ORAL_TABLET | Freq: Every day | ORAL | Status: DC
  Administered 2019-09-04 – 2019-09-07 (×5): 10 mg via ORAL
  Filled 2019-09-04 (×6): qty 1

## 2019-09-04 MED ORDER — POLYVINYL ALCOHOL 1.4 % OP SOLN
1.0000 [drp] | OPHTHALMIC | Status: DC | PRN
  Administered 2019-09-04: 1 [drp] via OPHTHALMIC
  Filled 2019-09-04: qty 15

## 2019-09-04 MED ORDER — ALBUTEROL SULFATE HFA 108 (90 BASE) MCG/ACT IN AERS: 2.0000 | INHALATION_SPRAY | Freq: Two times a day (BID) | RESPIRATORY_TRACT | Status: DC

## 2019-09-04 MED ORDER — BUDESONIDE 0.25 MG/2ML IN SUSP
0.2500 mg | Freq: Two times a day (BID) | RESPIRATORY_TRACT | Status: DC
  Administered 2019-09-04 – 2019-09-08 (×8): 0.25 mg via RESPIRATORY_TRACT
  Filled 2019-09-04 (×8): qty 2

## 2019-09-04 MED ORDER — LORATADINE 10 MG PO TABS
10.0000 mg | ORAL_TABLET | Freq: Every day | ORAL | Status: DC
  Administered 2019-09-04 – 2019-09-08 (×5): 10 mg via ORAL
  Filled 2019-09-04 (×5): qty 1

## 2019-09-04 MED ORDER — PANTOPRAZOLE SODIUM 40 MG PO TBEC
40.0000 mg | DELAYED_RELEASE_TABLET | Freq: Every day | ORAL | Status: DC
  Administered 2019-09-04 – 2019-09-08 (×5): 40 mg via ORAL
  Filled 2019-09-04 (×5): qty 1

## 2019-09-04 MED ORDER — ACETAMINOPHEN 650 MG RE SUPP: 650.0000 mg | Freq: Four times a day (QID) | RECTAL | Status: DC | PRN

## 2019-09-04 NOTE — Progress Notes (Signed)
Bilateral lower extremity venous duplex exam completed.  Preliminary results can be found under CV proc under chart review.  09/04/2019 11:33 AM  Mysty Kielty, K., RDMS, RVT

## 2019-09-04 NOTE — Progress Notes (Signed)
Patient is 69 year old male with history of MDS status post bone marrow transplant 3-1/2 years ago, history of adrenal insufficiency after procedure, graft-versus-host disease on chronic steroids, hyperglycemia secondary to steroids, history of stroke and hypertension presented to the emergency room with intractable nausea and vomiting started suddenly for 1 day.  He had some low-grade temperatures few days ago but he continued to take his prescribed dose of hydrocortisone 10 mg in the morning and 5 mg in the evening.  He does have extensive history of adrenal insufficiency and occasionally lands up in the emergency room due to persistent nausea vomiting and need for IV steroids.  Denies any recent persistent fever, cough, cold and congestion, chest pain or abdominal pain.  His bowel habits are normal.  No sick contacts.  In the emergency room, he was afebrile.  He was on room air.  He was tachycardic and blood pressures were less than 100.  Covid test was negative.  WC count 17.8, creatinine 1.5 which is slightly more than his baseline.  Patient was given 100 mg of IV hydrocortisone and fluid boluses, continue to have poor appetite and unable to eat so admission requested.  Patient seen and examined.  I reviewed his reports from previous hospitalizations, his doctor's note from [facility] and reviewed that with the patient.  Patient was recently on 15 mg of hydrocortisone daily and he was supposed to take 1 additional tablet if having any stress.  #1 intractable nausea and vomiting likely secondary to adrenal crisis, Some improvement of symptoms with IV steroids. We will challenge him with oral steroids, will keep on increased dose of hydrocortisone, discharged on tapering dose to go back on his regular dose in 3 to 4 days. We will challenge him with clear liquid diet and advance if he tolerates it. Developing lower extremity swellings and tenderness, has history of right lower leg DVT, will check  duplexes. Blood cultures are drawn, leukocytosis could be from steroid use, procalcitonin is significantly elevated to 27.  On clinical examination and available test, he does not have any evidence of bacterial infection however will monitor.  Keeping off antibiotics.  Due to significant symptoms and need for monitoring in the hospital, anticipate he will stay in the hospital for more than 2 midnights.  Recheck electrolytes and CBC in the morning.  Anticipate discharge home in the morning if tolerating oral steroids and diet.

## 2019-09-04 NOTE — H&P (Addendum)
History and Physical    Brandon [name] [id number] DOB: Feb 26, 1951 DOA: 09/03/2019  PCP: [name], MD  Patient coming from: Home  Chief Complaint: Nausea vomiting.  HPI: Brandon [name] is a 69 y.o. male with history of MDS status post bone marrow transplant, history of adrenal insufficiency and GVHD on chronic steroids, hyperglycemia secondary to steroids history of stroke hypertension presents to the ER because of intractable nausea vomiting since yesterday afternoon.  Denies any abdominal pain or diarrhea.  Patient said over the weekend he had some subjective feeling of fever and chills.  Has chronic cough nonproductive.  This fever subsided without any intervention.  Patient called his endocrinologist and was advised to take increased dose of his hydrocortisone which he takes vitamins of sensitive.  But since patient had persistent vomiting he presents to the ER.  Patient has had previous history of fungal pneumonia which was treated with posaconazole.  ED Course: In the ER patient was afebrile and not hypoxic.  Patient was tachycardic and blood pressure was in the low normal.  Covid test was negative.  Labs show WBC count of 17.8 creatinine 1.5 increased from baseline of normal last October and 1.1 in January 2021.  Patient was given 100 mg IV hydrocortisone stress dose and fluid bolus and admitted for nausea vomiting likely from adrenal crisis.  Review of Systems: As per HPI, rest all negative.   Past Medical History:  Diagnosis Date  . Adrenocortical insufficiency ([place]) 08/06/2017   Patient with 2 hospitalizations in January 2019- treated with fluids, antibiotics, steroids- ultimately thought most likely due to adrenal insufficiency with no obvious source of infection found  . Allergy    seasonal/animals  . Chronic prostatitis 12/17/2008  . Diverticulosis 01/16/2011   History of diverticulitis   . ERECTILE DYSFUNCTION 02/07/2007  . HYPERLIPIDEMIA 02/04/2007  . HYPERTENSION  02/04/2007  . NEOPLASM, MALIGNANT, SKIN, BACK 02/09/2009  . Pneumonia, viral 12/2017  . S/P bone marrow transplant [facility])     Past Surgical History:  Procedure Laterality Date  . CATARACT EXTRACTION Left 06/2018  . none       reports that he has never smoked. He has never used smokeless tobacco. He reports current alcohol use of about 3.0 standard drinks of alcohol per week. He reports that he does not use drugs.  Allergies  Allergen Reactions  . Tape Other (See Comments)    Skin irritation that may cause a scab if left on too long.     Family History  Problem Relation Age of Onset  . Breast cancer Mother   . CVA Mother        quit taking HTN meds  . Alzheimer's disease Father        late 32s. mid 67s  . Asperger's syndrome Son     Prior to Admission medications   Medication Sig Start Date End Date Taking? Authorizing Provider  acyclovir (ZOVIRAX) 400 MG tablet Take 400 mg by mouth 2 (two) times daily.   Yes [provider]  albuterol (PROVENTIL HFA;VENTOLIN HFA) 108 (90 Base) MCG/ACT inhaler Inhale 1-2 puffs into the lungs every 6 (six) hours as needed for wheezing or shortness of breath.    Yes [provider]  amLODipine (NORVASC) 10 MG tablet Take 10 mg by mouth daily.   Yes [provider]  aspirin EC 325 MG tablet Take 81 mg by mouth at bedtime.    Yes [provider]  Cholecalciferol (VITAMIN D) 50 MCG (2000  UT) CAPS Take 2,000 Units by mouth at bedtime.    Yes [provider]  diphenhydrAMINE HCl (BENADRYL ALLERGY PO) Take by mouth at bedtime as needed.   Yes [provider]  famotidine (PEPCID) 20 MG tablet Take 20 mg by mouth daily.   Yes [provider]  fluticasone (FLONASE) 50 MCG/ACT nasal spray Place 2 sprays into both nostrils daily as needed for allergies or rhinitis.   Yes [provider]  fluticasone (FLOVENT HFA) 220 MCG/ACT inhaler Inhale 2 puffs into the lungs 2 (two) times daily.    Yes  [provider]  guaiFENesin (MUCINEX PO) Take by mouth.   Yes [provider]  hydrocortisone (CORTEF) 10 MG tablet Take 5-10 mg by mouth See admin instructions. Take 20mg  in am and 10mg  in afternoon 08/31/19  Yes [provider]  hydrocortisone cream 1 % Apply 1 application topically as needed for itching.   Yes [provider]  loratadine (CLARITIN) 10 MG tablet Take 10 mg by mouth daily.   Yes [provider]  LORazepam (ATIVAN) 0.5 MG tablet Take 0.5 mg by mouth every 4 (four) hours as needed for anxiety.   Yes [provider]  Magnesium Oxide 400 MG CAPS Take 1 capsule (400 mg total) by mouth 5 (five) times daily. Patient taking differently: Take 400 mg by mouth daily.  09/03/16  Yes Nicholas Lose, MD  Melatonin 3 MG TBDP Take by mouth at bedtime as needed.   Yes [provider]  metFORMIN (GLUCOPHAGE) 500 MG tablet Take 0.5 tablets (250 mg total) by mouth 2 (two) times daily with a meal. Patient taking differently: Take 500 mg by mouth 2 (two) times daily with a meal.  07/27/19  Yes [name], MD  metoprolol tartrate (LOPRESSOR) 25 MG tablet Take 1 tablet by mouth 2 (two) times daily. 01/20/19  Yes [provider]  montelukast (SINGULAIR) 10 MG tablet Take 10 mg by mouth daily.    Yes [provider]  Multiple Vitamin (MULTI-VITAMIN) tablet Take by mouth.   Yes [provider]  Omeprazole 20 MG TBEC Take 20 mg by mouth daily.  08/25/18  Yes [provider]  ondansetron (ZOFRAN-ODT) 8 MG disintegrating tablet Take 8 mg by mouth every 8 (eight) hours as needed for nausea or vomiting.   Yes [provider]  oxycodone (OXY-IR) 5 MG capsule Take 5 mg by mouth every 6 (six) hours as needed for pain.   Yes [provider]  Polyethylene Glycol 3350 (MIRALAX PO) Take by mouth.   Yes [provider]  polyvinyl alcohol (LIQUIFILM TEARS) 1.4 % ophthalmic solution Place 1 drop  into both eyes as needed for dry eyes.   Yes [provider]  rosuvastatin (CRESTOR) 10 MG tablet Take 10 mg by mouth at bedtime.  03/03/19  Yes [provider]  senna-docusate (SENOKOT-S) 8.6-50 MG tablet Take 2 tablets by mouth at bedtime.    Yes [provider]  Lancets (ONETOUCH DELICA PLUS 123XX123) [place]  07/14/18   [provider]    Physical Exam: Constitutional: Moderately built and nourished. Vitals:   09/03/19 1928 09/03/19 2015 09/03/19 2100 09/03/19 2200  BP:  (!) 126/97 128/76 108/64  Pulse:  (!) 122 (!) 121 (!) 119  Resp:  (!) 23 (!) 25 (!) 28  Temp:  97.7 F (36.5 C)    TempSrc:  Oral    SpO2:  93% 90% 91%  Weight: 104.3 kg  Height: 5\' 10"  (1.778 m)      Eyes: Anicteric no pallor. ENMT: No discharge from the ears eyes nose or mouth. Neck: No masses.  No neck rigidity. Respiratory: No rhonchi or crepitations. Cardiovascular: S1-S2 heard. Abdomen: Soft nontender bowel sounds present. Musculoskeletal: No edema. Skin: No rash. Neurologic: Alert awake oriented time place and person.  Moves all extremities. Psychiatric: Appears normal but normal affect.   Labs on Admission: I have personally reviewed following labs and imaging studies  CBC: Recent Labs  Lab 09/03/19 1950  WBC 17.8*  HGB 16.0  HCT 50.4  MCV 91.5  PLT 123456*   Basic Metabolic Panel: Recent Labs  Lab 09/03/19 1950  NA 141  K 4.7  CL 106  CO2 22  GLUCOSE 145*  BUN 20  CREATININE 1.54*  CALCIUM 9.5   GFR: Estimated Creatinine Clearance: 54 mL/min (A) (by C-G formula based on SCr of 1.54 mg/dL (H)). Liver Function Tests: Recent Labs  Lab 09/03/19 1950  AST 30  ALT 22  ALKPHOS 114  BILITOT 0.8  PROT 7.9  ALBUMIN 4.2   Recent Labs  Lab 09/03/19 1950  LIPASE 22   No results for input(s): AMMONIA in the last 168 hours. Coagulation Profile: No results for input(s): INR, PROTIME in the last 168 hours. Cardiac Enzymes: No results for  input(s): CKTOTAL, CKMB, CKMBINDEX, TROPONINI in the last 168 hours. BNP (last 3 results) No results for input(s): PROBNP in the last 8760 hours. HbA1C: No results for input(s): HGBA1C in the last 72 hours. CBG: No results for input(s): GLUCAP in the last 168 hours. Lipid Profile: No results for input(s): CHOL, HDL, LDLCALC, TRIG, CHOLHDL, LDLDIRECT in the last 72 hours. Thyroid Function Tests: No results for input(s): TSH, T4TOTAL, FREET4, T3FREE, THYROIDAB in the last 72 hours. Anemia Panel: No results for input(s): VITAMINB12, FOLATE, FERRITIN, TIBC, IRON, RETICCTPCT in the last 72 hours. Urine analysis:    Component Value Date/Time   COLORURINE YELLOW 11/07/2018 2114   APPEARANCEUR CLEAR 11/07/2018 2114   LABSPEC 1.011 11/07/2018 2114   PHURINE 7.0 11/07/2018 2114   GLUCOSEU NEGATIVE 11/07/2018 2114   GLUCOSEU NEGATIVE 12/23/2009 0742   HGBUR NEGATIVE 11/07/2018 2114   HGBUR negative 01/24/2007 0810   BILIRUBINUR NEGATIVE 11/07/2018 2114   BILIRUBINUR negative 08/10/2014 1057   BILIRUBINUR n 02/11/2013 1059   KETONESUR NEGATIVE 11/07/2018 2114   PROTEINUR NEGATIVE 11/07/2018 2114   UROBILINOGEN 0.2 08/10/2014 1057   UROBILINOGEN 0.2 12/23/2009 0742   NITRITE NEGATIVE 11/07/2018 2114   LEUKOCYTESUR NEGATIVE 11/07/2018 2114   Sepsis Labs: @LABRCNTIP (procalcitonin:4,lacticidven:4) )No results found for this or any previous visit (from the past 240 hour(s)).   Radiological Exams on Admission: No results found.   Assessment/Plan Principal Problem:   Nausea & vomiting Active Problems:   Essential hypertension   History of stem cell transplant ([place])   Adrenocortical insufficiency ([facility])   History of CVA (cerebrovascular accident)    1. Intractable nausea vomiting likely secondary to adrenal crisis abdomen appears benign.  LFTs unremarkable.  We will keep patient on stress dose steroids.  Closely monitor.  Continue with hydration. 2. Leukocytosis presently afebrile.   Will check blood cultures chest x-ray urine is unremarkable.  Covid test is negative. 3. Acute renal failure likely prerenal from vomiting.  Continue hydration and follow intake output and metabolic panel. 4. History of hypertension presently blood pressures in the low normal but improving with fluids.  Will hold amlodipine and continue metoprolol with holding parameters. 5. History of  CVA on aspirin and statins. 6. Patient states he takes albuterol Flovent and singular for GVHD. 7. Previous history of fungal pneumonia follow chest x-ray. 8. Steroid-induced hyperglycemia -on Metformin.  Will closely monitor CBGs. 9. History of MDS/AML status post stem cell transplant.  Chest x-ray is pending.   DVT prophylaxis: Lovenox. Code Status: Full code. Family Communication: Discussed with patient. Disposition Plan: Home. Consults called: None. Admission status: Observation.   Rise Patience MD [facility] Pager [phone].  If 7PM-7AM, please contact night-coverage [email or web address] Password [facility]  09/04/2019, 12:43 AM

## 2019-09-04 NOTE — ED Notes (Signed)
Patient given meal tray.

## 2019-09-05 ENCOUNTER — Inpatient Hospital Stay (HOSPITAL_COMMUNITY): Payer: Medicare Other

## 2019-09-05 LAB — CBC WITH DIFFERENTIAL/PLATELET
Abs Immature Granulocytes: 0.03 10*3/uL (ref 0.00–0.07)
Basophils Absolute: 0 10*3/uL (ref 0.0–0.1)
Basophils Relative: 0 %
Eosinophils Absolute: 0.4 10*3/uL (ref 0.0–0.5)
Eosinophils Relative: 5 %
HCT: 35.6 % — ABNORMAL LOW (ref 39.0–52.0)
Hemoglobin: 11.4 g/dL — ABNORMAL LOW (ref 13.0–17.0)
Immature Granulocytes: 0 %
Lymphocytes Relative: 19 %
Lymphs Abs: 1.8 10*3/uL (ref 0.7–4.0)
MCH: 29.6 pg (ref 26.0–34.0)
MCHC: 32 g/dL (ref 30.0–36.0)
MCV: 92.5 fL (ref 80.0–100.0)
Monocytes Absolute: 1 10*3/uL (ref 0.1–1.0)
Monocytes Relative: 11 %
Neutro Abs: 6 10*3/uL (ref 1.7–7.7)
Neutrophils Relative %: 65 %
Platelets: 297 10*3/uL (ref 150–400)
RBC: 3.85 MIL/uL — ABNORMAL LOW (ref 4.22–5.81)
RDW: 15.7 % — ABNORMAL HIGH (ref 11.5–15.5)
WBC: 9.2 10*3/uL (ref 4.0–10.5)
nRBC: 0 % (ref 0.0–0.2)

## 2019-09-05 LAB — BASIC METABOLIC PANEL
Anion gap: 7 (ref 5–15)
BUN: 22 mg/dL (ref 8–23)
CO2: 22 mmol/L (ref 22–32)
Calcium: 8.2 mg/dL — ABNORMAL LOW (ref 8.9–10.3)
Chloride: 109 mmol/L (ref 98–111)
Creatinine, Ser: 1.06 mg/dL (ref 0.61–1.24)
GFR calc Af Amer: >60 mL/min (ref >60)
GFR calc non Af Amer: >60 mL/min (ref >60)
Glucose, Bld: 115 mg/dL — ABNORMAL HIGH (ref 70–99)
Potassium: 4.1 mmol/L (ref 3.5–5.1)
Sodium: 138 mmol/L (ref 135–145)

## 2019-09-05 LAB — GLUCOSE, CAPILLARY
Glucose-Capillary: 105 mg/dL — ABNORMAL HIGH (ref 70–99)
Glucose-Capillary: 60 mg/dL — ABNORMAL LOW (ref 70–99)
Glucose-Capillary: 89 mg/dL (ref 70–99)
Glucose-Capillary: 102 mg/dL — ABNORMAL HIGH (ref 70–99)
Glucose-Capillary: 114 mg/dL — ABNORMAL HIGH (ref 70–99)

## 2019-09-05 MED ORDER — SODIUM CHLORIDE (PF) 0.9 % IJ SOLN
INTRAMUSCULAR | Status: AC
  Filled 2019-09-05: qty 50

## 2019-09-05 MED ORDER — IOHEXOL 9 MG/ML PO SOLN: 500.0000 mL | ORAL | Status: AC

## 2019-09-05 MED ORDER — IOHEXOL 300 MG/ML  SOLN
100.0000 mL | Freq: Once | INTRAMUSCULAR | Status: AC | PRN
  Administered 2019-09-05: 100 mL via INTRAVENOUS

## 2019-09-05 MED ORDER — IOHEXOL 9 MG/ML PO SOLN
ORAL | Status: AC
  Administered 2019-09-05: 1000 mL
  Filled 2019-09-05: qty 1000

## 2019-09-05 MED ORDER — SODIUM CHLORIDE 0.9 % IV SOLN: INTRAVENOUS | Status: AC

## 2019-09-05 NOTE — Progress Notes (Signed)
RN advised of the patient low CBG of 60. Patient stated that he would like it rechecked and felt it was not correct. Rechecked CBG on a different finger, 89.

## 2019-09-05 NOTE — Progress Notes (Signed)
PT Cancellation/Screen Note  Patient Details Name: Brandon [name] MRN: [id number] DOB: 21-Jun-1951   Cancelled Treatment:    Reason Eval/Treat Not Completed: PT screened, no needs identified, will sign off.Spoke with pt who denies need for PT services. Pt is Independent at baseline and has been up mobilizing in room without difficulty or assistance during this hospital stay. Will sign off.    Doreatha Massed, PT Acute Rehabilitation

## 2019-09-05 NOTE — Progress Notes (Signed)
PROGRESS NOTE  [name] [id number] DOB: 08-Feb-1951 DOA: 09/03/2019 PCP: [name], MD  HPI/Recap of past 24 hours: HAKEIM [name] is a 69 y.o. male with history of MDS status post bone marrow transplant, history of adrenal insufficiency and GVHD on chronic steroids, hyperglycemia secondary to steroids history of stroke hypertension presents to the ER because of intractable nausea vomiting since yesterday afternoon.  Denies any abdominal pain or diarrhea.  Patient said over the weekend he had some subjective feeling of fever and chills.  Has chronic cough nonproductive.  This fever subsided without any intervention.  Patient called his endocrinologist and was advised to take increased dose of his hydrocortisone which he takes vitamins of sensitive.  But since patient had persistent vomiting he presents to the ER.  Patient has had previous history of fungal pneumonia which was treated with posaconazole.  ED Course: In the ER patient was afebrile and not hypoxic.  Patient was tachycardic and blood pressure was in the low normal.  Covid test was negative.  Labs show WBC count of 17.8 creatinine 1.5 increased from baseline of normal last October and 1.1 in January 2021.  Patient was given 100 mg IV hydrocortisone stress dose and fluid bolus and admitted for nausea vomiting likely from adrenal crisis.  09/05/19: Normal saline.  States he feels okay today.  He has no new complaints.  He is on Cortef for adrenal insufficiency.  Will contact his transplant oncologist at [phone].   Assessment/Plan: Principal Problem:   Nausea & vomiting Active Problems:   Essential hypertension   History of stem cell transplant ([place])   Adrenocortical insufficiency ([facility])   History of CVA (cerebrovascular accident)   Adrenal crisis syndrome ([facility])  Intractable nausea vomiting suspected secondary to adrenal crisis LFTs unremarkable On stress dose steroids  AKI on CKD 2 Baseline creatinine  appears to be 1.1 with GFR 60 Presented with creatinine of 1.54 Creatinine is trending down Avoid nephrotoxins and hypotension Monitor urine output Repeat BMP in the morning  Elevated procalcitonin Presented with procalcitonin 27 Lactic acid 1.6 No evidence of active infective process Blood cultures no growth today  Resolved leukocytosis possibly reactive in the setting of steroid use Leukocytosis resolved Afebrile Not on antibiotics  History of CVA On aspirin and statin, continue PT OT to assess Continue PT OT with assistance and fall precautions  History of fungal pneumonia  No acute issues  History of MDS/AML status post stem cell transplant Will contact transplant oncology  DVT prophylaxis: Lovenox subcu daily. Code Status: Full code. Family Communication:   We will call family if it is ok with the patient.  Disposition Plan: Patient is from home.  Anticipate discharge to home in the next 24 to 48 hours.  Barrier to discharge pending blood cultures.   . Consults called: None.      Objective: Vitals:   09/04/19 2124 09/05/19 0530 09/05/19 0845 09/05/19 1353  BP:  128/70  118/71  Pulse:  66  65  Resp:  20  20  Temp:  98.5 F (36.9 C)  98.1 F (36.7 C)  TempSrc:  Oral  Oral  SpO2: 99% 98% 95% 96%  Weight:  109 kg    Height:        Intake/Output Summary (Last 24 hours) at 09/05/2019 1355 Last data filed at 09/04/2019 2200 Gross per 24 hour  Intake 321.84 ml  Output --  Net 321.84 ml   Filed Weights   09/03/19 1928 09/05/19 0530  Weight: 104.3 kg 109 kg    Exam:  . General: 69 y.o. year-old male well developed well nourished in no acute distress.  Alert and oriented x3. . Cardiovascular: Regular rate and rhythm with no rubs or gallops.  No thyromegaly or JVD noted.   Marland Kitchen Respiratory: Clear to auscultation with no wheezes or rales. Good inspiratory effort. . Abdomen: Soft nontender nondistended with normal bowel sounds x4  quadrants. . Musculoskeletal: No lower extremity edema. 2/4 pulses in all 4 extremities. Marland Kitchen Psychiatry: Mood is appropriate for condition and setting   Data Reviewed: CBC: Recent Labs  Lab 09/03/19 1950 09/04/19 0528 09/05/19 0409  WBC 17.8* 13.1* 9.2  NEUTROABS  --   --  6.0  HGB 16.0 11.9* 11.4*  HCT 50.4 36.2* 35.6*  MCV 91.5 91.4 92.5  PLT 428* 301 [id number]   Basic Metabolic Panel: Recent Labs  Lab 09/03/19 1950 09/04/19 0528 09/05/19 0409  NA 141 137 138  K 4.7 4.7 4.1  CL 106 107 109  CO2 22 21* 22  GLUCOSE 145* 134* 115*  BUN 20 26* 22  CREATININE 1.54* 1.42* 1.06  CALCIUM 9.5 7.8* 8.2*   GFR: Estimated Creatinine Clearance: 80.2 mL/min (by C-G formula based on SCr of 1.06 mg/dL). Liver Function Tests: Recent Labs  Lab 09/03/19 1950  AST 30  ALT 22  ALKPHOS 114  BILITOT 0.8  PROT 7.9  ALBUMIN 4.2   Recent Labs  Lab 09/03/19 1950  LIPASE 22   No results for input(s): AMMONIA in the last 168 hours. Coagulation Profile: No results for input(s): INR, PROTIME in the last 168 hours. Cardiac Enzymes: No results for input(s): CKTOTAL, CKMB, CKMBINDEX, TROPONINI in the last 168 hours. BNP (last 3 results) No results for input(s): PROBNP in the last 8760 hours. HbA1C: No results for input(s): HGBA1C in the last 72 hours. CBG: Recent Labs  Lab 09/04/19 1720 09/04/19 2209 09/05/19 0809 09/05/19 1139 09/05/19 1142  GLUCAP 82 86 105* 60* 89   Lipid Profile: No results for input(s): CHOL, HDL, LDLCALC, TRIG, CHOLHDL, LDLDIRECT in the last 72 hours. Thyroid Function Tests: No results for input(s): TSH, T4TOTAL, FREET4, T3FREE, THYROIDAB in the last 72 hours. Anemia Panel: No results for input(s): VITAMINB12, FOLATE, FERRITIN, TIBC, IRON, RETICCTPCT in the last 72 hours. Urine analysis:    Component Value Date/Time   COLORURINE YELLOW 09/04/2019 0230   APPEARANCEUR CLEAR 09/04/2019 0230   LABSPEC 1.017 09/04/2019 0230   PHURINE 5.0 09/04/2019 0230    GLUCOSEU NEGATIVE 09/04/2019 0230   GLUCOSEU NEGATIVE 12/23/2009 0742   HGBUR NEGATIVE 09/04/2019 0230   HGBUR negative 01/24/2007 0810   BILIRUBINUR NEGATIVE 09/04/2019 0230   BILIRUBINUR negative 08/10/2014 1057   BILIRUBINUR n 02/11/2013 1059   KETONESUR 20 (A) 09/04/2019 0230   PROTEINUR NEGATIVE 09/04/2019 0230   UROBILINOGEN 0.2 08/10/2014 1057   UROBILINOGEN 0.2 12/23/2009 0742   NITRITE NEGATIVE 09/04/2019 0230   LEUKOCYTESUR NEGATIVE 09/04/2019 0230   Sepsis Labs: @LABRCNTIP (procalcitonin:4,lacticidven:4)  ) Recent Results (from the past 240 hour(s))  SARS CORONAVIRUS 2 (TAT 6-24 HRS) Nasopharyngeal Nasopharyngeal Swab     Status: None   Collection Time: 09/03/19 10:07 PM   Specimen: Nasopharyngeal Swab  Result Value Ref Range Status   SARS Coronavirus 2 NEGATIVE NEGATIVE Final    Comment: (NOTE) SARS-CoV-2 target nucleic acids are NOT DETECTED. The SARS-CoV-2 RNA is generally detectable in upper and lower respiratory specimens during the acute phase of infection. Negative results do not preclude SARS-CoV-2 infection, do not  rule out co-infections with other pathogens, and should not be used as the sole basis for treatment or other patient management decisions. Negative results must be combined with clinical observations, patient history, and epidemiological information. The expected result is Negative. Fact Sheet for Patients: SugarRoll.be Fact Sheet for Healthcare Providers: https://www.woods-mathews.com/ This test is not yet approved or cleared by the [place] FDA and  has been authorized for detection and/or diagnosis of SARS-CoV-2 by FDA under an Emergency Use Authorization (EUA). This EUA will remain  in effect (meaning this test can be used) for the duration of the COVID-19 declaration under Section 56 4(b)(1) of the Act, 21 U.S.C. section 360bbb-3(b)(1), unless the authorization is terminated or revoked  sooner. Performed at [facility], [place] [address] [place], [place] [address]   Culture, blood (routine x 2)     Status: None (Preliminary result)   Collection Time: 09/04/19  6:23 AM   Specimen: BLOOD  Result Value Ref Range Status   Specimen Description   Final    BLOOD LEFT ANTECUBITAL Performed at [facility] [address], [place], [place] [address]    Special Requests   Final    BOTTLES DRAWN AEROBIC AND ANAEROBIC Blood Culture adequate volume Performed at [facility] [address], [place], [place] [address]    Culture   Final    NO GROWTH 1 DAY Performed at [facility], [place] [address], [place],  [address]    Report Status PENDING  Incomplete      Studies: No results found.  Scheduled Meds: . acyclovir  400 mg Oral BID  . aspirin EC  81 mg Oral QHS  . budesonide (PULMICORT) nebulizer solution  0.25 mg Nebulization BID  . cholecalciferol  2,000 Units Oral QHS  . enoxaparin (LOVENOX) injection  40 mg Subcutaneous Q24H  . guaiFENesin  600 mg Oral BID  . hydrocortisone  20 mg Oral BID  . loratadine  10 mg Oral Daily  . magnesium oxide  400 mg Oral Daily  . metoprolol tartrate  25 mg Oral BID  . montelukast  10 mg Oral Daily  . pantoprazole  40 mg Oral Daily  . rosuvastatin  10 mg Oral QHS  . senna-docusate  2 tablet Oral QHS    Continuous Infusions:   LOS: 1 day     Kayleen Memos, MD [facility] Pager [phone]  If 7PM-7AM, please contact night-coverage [email or web address] Password [email or web address] 09/05/2019, 1:55 PM

## 2019-09-06 LAB — CBC WITH DIFFERENTIAL/PLATELET
Abs Immature Granulocytes: 0.02 10*3/uL (ref 0.00–0.07)
Basophils Absolute: 0 10*3/uL (ref 0.0–0.1)
Basophils Relative: 0 %
Eosinophils Absolute: 0.3 10*3/uL (ref 0.0–0.5)
Eosinophils Relative: 3 %
HCT: 36 % — ABNORMAL LOW (ref 39.0–52.0)
Hemoglobin: 11.4 g/dL — ABNORMAL LOW (ref 13.0–17.0)
Immature Granulocytes: 0 %
Lymphocytes Relative: 34 %
Lymphs Abs: 2.5 10*3/uL (ref 0.7–4.0)
MCH: 29.6 pg (ref 26.0–34.0)
MCHC: 31.7 g/dL (ref 30.0–36.0)
MCV: 93.5 fL (ref 80.0–100.0)
Monocytes Absolute: 0.9 10*3/uL (ref 0.1–1.0)
Monocytes Relative: 13 %
Neutro Abs: 3.6 10*3/uL (ref 1.7–7.7)
Neutrophils Relative %: 50 %
Platelets: 295 10*3/uL (ref 150–400)
RBC: 3.85 MIL/uL — ABNORMAL LOW (ref 4.22–5.81)
RDW: 15.6 % — ABNORMAL HIGH (ref 11.5–15.5)
WBC: 7.3 10*3/uL (ref 4.0–10.5)
nRBC: 0 % (ref 0.0–0.2)

## 2019-09-06 LAB — COMPREHENSIVE METABOLIC PANEL
ALT: 16 U/L (ref 0–44)
AST: 15 U/L (ref 15–41)
Albumin: 2.8 g/dL — ABNORMAL LOW (ref 3.5–5.0)
Alkaline Phosphatase: 67 U/L (ref 38–126)
Anion gap: 5 (ref 5–15)
BUN: 15 mg/dL (ref 8–23)
CO2: 24 mmol/L (ref 22–32)
Calcium: 8.1 mg/dL — ABNORMAL LOW (ref 8.9–10.3)
Chloride: 109 mmol/L (ref 98–111)
Creatinine, Ser: 1.06 mg/dL (ref 0.61–1.24)
GFR calc Af Amer: >60 mL/min (ref >60)
GFR calc non Af Amer: >60 mL/min (ref >60)
Glucose, Bld: 105 mg/dL — ABNORMAL HIGH (ref 70–99)
Potassium: 4.1 mmol/L (ref 3.5–5.1)
Sodium: 138 mmol/L (ref 135–145)
Total Bilirubin: 0.7 mg/dL (ref 0.3–1.2)
Total Protein: 5.4 g/dL — ABNORMAL LOW (ref 6.5–8.1)

## 2019-09-06 LAB — GLUCOSE, CAPILLARY
Glucose-Capillary: 90 mg/dL (ref 70–99)
Glucose-Capillary: 85 mg/dL (ref 70–99)
Glucose-Capillary: 126 mg/dL — ABNORMAL HIGH (ref 70–99)
Glucose-Capillary: 83 mg/dL (ref 70–99)

## 2019-09-06 LAB — PROCALCITONIN: Procalcitonin: 10.73 ng/mL

## 2019-09-06 LAB — LACTIC ACID, PLASMA: Lactic Acid, Venous: 0.9 mmol/L (ref 0.5–1.9)

## 2019-09-06 MED ORDER — SODIUM CHLORIDE 0.9 % IV SOLN
1.0000 g | Freq: Every day | INTRAVENOUS | Status: DC
  Administered 2019-09-06 – 2019-09-07 (×2): 1 g via INTRAVENOUS
  Filled 2019-09-06 (×2): qty 1

## 2019-09-06 NOTE — Progress Notes (Signed)
OT Cancellation Note  Patient Details Name: Brandon [name] MRN: [id number] DOB: 06/17/1951   Cancelled Treatment:    Reason Eval/Treat Not Completed: OT screened, no needs identified, will sign off  [place] 09/06/2019, 9:45 AM  Karsten Ro, OTR/L Acute Rehabilitation Services 09/06/2019

## 2019-09-06 NOTE — Progress Notes (Signed)
PROGRESS NOTE  Brandon [name] [id number] DOB: 1951-05-04 DOA: 09/03/2019 PCP: [name], MD  HPI/Recap of past 24 hours: Brandon [name] is a 69 y.o. 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  Denies any abdominal pain or diarrhea.  Over the weekend had some subjective feeling of fever and chills.  Has chronic cough nonproductive.  Symptoms subsided without any intervention.  Patient called his endocrinologist and was advised to take increased dose of his hydrocortisone.   Due to persistent vomiting he presents to the ER.  Patient has had previous history of fungal pneumonia which was treated with Fluconazole.  Covid test was negative.  Labs show WBC count of 17.8 creatinine 1.5 increased from baseline of normal last October and 1.1 in January 2021.  Patient was given 100 mg IV hydrocortisone stress dose and IV fluid bolus and admitted for nausea vomiting likely from adrenal crisis.  Unclear trigger for suspected adrenal crisis.  Contact his transplant oncologist at [phone].  CT chest/abd/pelvis with cntrast completed on 3/14. Suspicion for cystitis and ascending urinary tract infection.  Urine culture obtained and started on Rocephin empirically.  09/06/19: Seen and examined.  Feels slightly better than yesterday.    Assessment/Plan: Principal Problem:   Nausea & vomiting Active Problems:   Essential hypertension   History of stem cell transplant (HCC)   Adrenocortical insufficiency (HCC)   History of CVA (cerebrovascular accident)   Adrenal crisis syndrome (HCC)  Intractable nausea vomiting suspected secondary to adrenal crisis LFTs unremarkable Received stress dose steroids Continue Cortef Continue antiemetics as needed  Presumptive UTI CT abdomen  and pelvis with concern for cystitis and possible ascending urinary tract infection Urine culture sent Started on Rocephin empirically  AKI on CKD 2 Baseline creatinine appears to be 1.1 with GFR 60 Presented with creatinine of 1.54 Creatinine is trending down Avoid nephrotoxins and hypotension Continue to monitor urine output Repeat BMP in the morning  Elevated procalcitonin Presented with procalcitonin 27 Procalcitonin is trending down to 10 Blood cultures no growth to date.  Resolved leukocytosis possibly reactive in the setting of steroid use Leukocytosis resolved Afebrile  History of CVA On aspirin and statin, continue PT OT to assess Continue PT OT with assistance and fall precautions  History of fungal pneumonia  No acute issues O2 saturation 95% on room air  History of MDS/AML status post stem cell transplant Discussed with patient's transplant oncologist from [employer] on 09/05/19  DVT prophylaxis: Lovenox subcu daily. Code Status: Full code. Family Communication:   We will call family if it is ok with the patient.  Disposition Plan: Patient is from home.  Anticipate discharge to home in the next 24 to 48 hours.  Barrier to discharge pending urine culture and ongoing treatment for presumptive UTI with IV antibiotics.   . Consults called: None.      Objective: Vitals:   09/05/19 2013 09/06/19 0550 09/06/19 0757 09/06/19 1354  BP: 122/62 114/68  123/75  Pulse: 70 (!) 59  63  Resp: 20 20  20   Temp: 98.1 F (36.7 C) 98.5 F (36.9 C)  98.6 F (37 C)  TempSrc: Oral Oral  Oral  SpO2: 100% 98% 95% 95%  Weight:  108.5 kg    Height:        Intake/Output Summary (Last 24  hours) at 09/06/2019 1443 Last data filed at 09/06/2019 0900 Gross per 24 hour  Intake 1065.87 ml  Output --  Net 1065.87 ml   Filed Weights   09/03/19 1928 09/05/19 0530 09/06/19 0550  Weight: 104.3 kg 109 kg 108.5 kg    Exam:  . General: 69 y.o. year-old male pleasant  well-developed well-nourished no acute stress.  Alert and oriented x3.   . Cardiovascular: Regular rate and rhythm no rubs or gallops.   Brandon [name] Respiratory: Clear to auscultation no wheezes or rales.   .  Abdomen: Soft nontender normal bowel sounds present.   . Musculoskeletal: No lower extremity edema bilaterally. Brandon [name] Psychiatry: Mood is appropriate for condition and setting.  Data Reviewed: CBC: Recent Labs  Lab 09/03/19 1950 09/04/19 0528 09/05/19 0409 09/06/19 0644  WBC 17.8* 13.1* 9.2 7.3  NEUTROABS  --   --  6.0 3.6  HGB 16.0 11.9* 11.4* 11.4*  HCT 50.4 36.2* 35.6* 36.0*  MCV 91.5 91.4 92.5 93.5  PLT 428* 301 297 [id number]   Basic Metabolic Panel: Recent Labs  Lab 09/03/19 1950 09/04/19 0528 09/05/19 0409 09/06/19 0644  NA 141 137 138 138  K 4.7 4.7 4.1 4.1  CL 106 107 109 109  CO2 22 21* 22 24  GLUCOSE 145* 134* 115* 105*  BUN 20 26* 22 15  CREATININE 1.54* 1.42* 1.06 1.06  CALCIUM 9.5 7.8* 8.2* 8.1*   GFR: Estimated Creatinine Clearance: 80 mL/min (by C-G formula based on SCr of 1.06 mg/dL). Liver Function Tests: Recent Labs  Lab 09/03/19 1950 09/06/19 0644  AST 30 15  ALT 22 16  ALKPHOS 114 67  BILITOT 0.8 0.7  PROT 7.9 5.4*  ALBUMIN 4.2 2.8*   Recent Labs  Lab 09/03/19 1950  LIPASE 22   No results for input(s): AMMONIA in the last 168 hours. Coagulation Profile: No results for input(s): INR, PROTIME in the last 168 hours. Cardiac Enzymes: No results for input(s): CKTOTAL, CKMB, CKMBINDEX, TROPONINI in the last 168 hours. BNP (last 3 results) No results for input(s): PROBNP in the last 8760 hours. HbA1C: No results for input(s): HGBA1C in the last 72 hours. CBG: Recent Labs  Lab 09/05/19 1142 09/05/19 1611 09/05/19 2110 09/06/19 0801 09/06/19 1135  GLUCAP 89 102* 114* 90 85   Lipid Profile: No results for input(s): CHOL, HDL, LDLCALC, TRIG, CHOLHDL, LDLDIRECT in the last 72 hours. Thyroid Function Tests: No results for input(s): TSH, T4TOTAL,  FREET4, T3FREE, THYROIDAB in the last 72 hours. Anemia Panel: No results for input(s): VITAMINB12, FOLATE, FERRITIN, TIBC, IRON, RETICCTPCT in the last 72 hours. Urine analysis:    Component Value Date/Time   COLORURINE YELLOW 09/04/2019 0230   APPEARANCEUR CLEAR 09/04/2019 0230   LABSPEC 1.017 09/04/2019 0230   PHURINE 5.0 09/04/2019 0230   GLUCOSEU NEGATIVE 09/04/2019 0230   GLUCOSEU NEGATIVE 12/23/2009 0742   HGBUR NEGATIVE 09/04/2019 0230   HGBUR negative 01/24/2007 0810   BILIRUBINUR NEGATIVE 09/04/2019 0230   BILIRUBINUR negative 08/10/2014 1057   BILIRUBINUR n 02/11/2013 1059   KETONESUR 20 (A) 09/04/2019 0230   PROTEINUR NEGATIVE 09/04/2019 0230   UROBILINOGEN 0.2 08/10/2014 1057   UROBILINOGEN 0.2 12/23/2009 0742   NITRITE NEGATIVE 09/04/2019 0230   LEUKOCYTESUR NEGATIVE 09/04/2019 0230   Sepsis Labs: @LABRCNTIP (procalcitonin:4,lacticidven:4)  ) Recent Results (from the past 240 hour(s))  SARS CORONAVIRUS 2 (TAT 6-24 HRS) Nasopharyngeal Nasopharyngeal Swab     Status: None   Collection Time: 09/03/19 10:07 PM   Specimen: Nasopharyngeal Swab  Result  Value Ref Range Status   SARS Coronavirus 2 NEGATIVE NEGATIVE Final    Comment: (NOTE) SARS-CoV-2 target nucleic acids are NOT DETECTED. The SARS-CoV-2 RNA is generally detectable in upper and lower respiratory specimens during the acute phase of infection. Negative results do not preclude SARS-CoV-2 infection, do not rule out co-infections with other pathogens, and should not be used as the sole basis for treatment or other patient management decisions. Negative results must be combined with clinical observations, patient history, and epidemiological information. The expected result is Negative. Fact Sheet for Patients: SugarRoll.be Fact Sheet for Healthcare Providers: https://www.woods-mathews.com/ This test is not yet approved or cleared by the [place] FDA and  has  been authorized for detection and/or diagnosis of SARS-CoV-2 by FDA under an Emergency Use Authorization (EUA). This EUA will remain  in effect (meaning this test can be used) for the duration of the COVID-19 declaration under Section 56 4(b)(1) of the Act, 21 U.S.C. section 360bbb-3(b)(1), unless the authorization is terminated or revoked sooner. Performed at [facility], [place] [address], [place], [place] [address]   Culture, blood (routine x 2)     Status: None (Preliminary result)   Collection Time: 09/04/19  6:23 AM   Specimen: BLOOD  Result Value Ref Range Status   Specimen Description   Final    BLOOD LEFT ANTECUBITAL Performed at [facility] [address], [place], [place] [address]    Special Requests   Final    BOTTLES DRAWN AEROBIC AND ANAEROBIC Blood Culture adequate volume Performed at [facility] [address], [place], [place] [address]    Culture   Final    NO GROWTH 2 DAYS Performed at [facility] [address], [place], [place] [address]    Report Status PENDING  Incomplete      Studies: CT CHEST W CONTRAST  Result Date: 09/05/2019 CLINICAL DATA:  Nausea, vomiting, elevated white blood cell count, fever and chills the EXAM: CT CHEST AND ABDOMEN WITH CONTRAST TECHNIQUE: Multidetector CT imaging of the chest and abdomen was performed following the standard protocol during bolus administration of intravenous contrast. CONTRAST:  173mL OMNIPAQUE IOHEXOL 300 MG/ML  SOLN COMPARISON:  CT chest, abdomen and pelvis 11/05/2010 FINDINGS: CT CHEST FINDINGS Cardiovascular: Normal heart size. No pericardial effusion. Few coronary artery calcifications are present. Atherosclerotic plaque within the normal caliber aorta. Normal 3 vessel branching of the aortic arch. Slight tortuosity of the brachiocephalic vessels. No acute aortic or periaortic abnormality is seen. Central pulmonary arteries are normal caliber. No large  central filling defects are identified on this non tailored examination of the pulmonary arteries. Mediastinum/Nodes: No mediastinal fluid or gas. Normal thyroid gland and thoracic inlet. No acute abnormality of the trachea or esophagus. No worrisome mediastinal, hilar or axillary adenopathy. Lungs/Pleura: Some bandlike opacity in the mesial left lower lobe and minimally in the right lower lobe are most compatible with subsegmental atelectasis and/or scarring. No consolidation, features of edema, pneumothorax, or effusion. No suspicious pulmonary nodules or masses. Musculoskeletal: No acute osseous abnormality or suspicious osseous lesion. No chest wall mass or suspicious soft tissue lesions identified. Bilateral gynecomastia. CT ABDOMEN FINDINGS Hepatobiliary: No focal liver abnormality is seen. No gallstones, gallbladder wall thickening, or biliary dilatation. Pancreas: Small bilobed cystic lesion possibly arising from the distal body of the pancreas versus the lesser curvature. Unchanged since exams as remote as 07/15/2017. No pancreatic ductal dilatation or surrounding inflammatory changes. Spleen: Normal in size without focal  abnormality. Adrenals/Urinary Tract: Normal adrenal glands. Kidneys enhance and excrete symmetrically. Few subcentimeter hypoattenuating foci are too small to fully characterize on CT imaging but statistically likely benign. No obstructive urolithiasis or hydronephrosis. There is mild circumferential bladder wall thickening and perivesicular hazy stranding as well as periureteral stranding of the distal ureters bilaterally. Stomach/Bowel: Distal esophagus, stomach and duodenal sweep are unremarkable. No small bowel wall thickening or dilatation. No evidence of obstruction. A normal appendix is visualized. Proximal colon is unremarkable. Numerous distal colonic diverticula. No active diverticular inflammation. There is segmental thickening the proximal sigmoid colon a portion of the colon  with numerous colonic diverticula which is similar to comparison exams and in the absence of adjacent pericolonic inflammation likely reflects chronic inflammatory change. Vascular/Lymphatic: Atherosclerotic plaque within the aorta and branch vessels. No aneurysm or ectasia. No acute periaortic abnormality. No suspicious or enlarged lymph nodes in the included lymphatic chains. Other: No free fluid. No free air. Small bilobed fluid collection measuring approximately 3 cm arising in the space between the pancreas and stomach, unclear origin. Detailed above in the pancreatic section. No other abnormal fluid collections in the abdomen or pelvis. No abdominopelvic free air or fluid. Few gas lucencies in the subcutaneous tissues of the left lower quadrant likely injectable related. Mild body wall edema. No bowel containing hernia. Small fat containing left inguinal hernia. Musculoskeletal: Multilevel degenerative changes are present in the imaged portions of the spine. New anterior wedging compression deformity of L2 with approximately 40% height loss anteriorly. No adjacent fluid or hemorrhage. No other acute osseous abnormality is seen. Degenerative changes noted in the spine, hips and pelvis. IMPRESSION: 1. Mild circumferential bladder wall thickening and perivesicular hazy stranding as well as periureteral stranding of the distal ureters bilaterally. Findings are suspicious for cystitis and ascending urinary tract infection. Correlation with urinalysis is recommended. 2. Extensive distal colonic diverticulosis without evidence of acute diverticulitis. Segmental thickening of the proximal sigmoid colon may reflect chronic diverticular inflammation though correlation with outpatient colonoscopy is recommended. 3. New anterior wedging compression deformity of L2 with approximately 40% height loss anteriorly. No adjacent fluid or hemorrhage. 4. 3 cm bilobed fluid collection arising in the space between the pancreas and  stomach, unclear origin but unchanged since January [id number] and almost certainly benign. Differential diagnosis includes small pseudocyst, exophytic side branch IPMN or possible gastric diverticulum among other etiologies. If felt to be clinically warranted could consider a nonemergent outpatient abdominal MRI with MRCP for further characterization. 5. Coronary artery calcifications. 6. Bilateral gynecomastia. 7. Small fat containing left inguinal hernia. 8. Mild body wall edema. Electronically Signed   By: Lovena Le M.D.   On: 09/05/2019 19:33   CT ABDOMEN PELVIS W CONTRAST  Result Date: 09/05/2019 CLINICAL DATA:  Nausea, vomiting, elevated white blood cell count, fever and chills the EXAM: CT CHEST AND ABDOMEN WITH CONTRAST TECHNIQUE: Multidetector CT imaging of the chest and abdomen was performed following the standard protocol during bolus administration of intravenous contrast. CONTRAST:  179mL OMNIPAQUE IOHEXOL 300 MG/ML  SOLN COMPARISON:  CT chest, abdomen and pelvis 11/05/2010 FINDINGS: CT CHEST FINDINGS Cardiovascular: Normal heart size. No pericardial effusion. Few coronary artery calcifications are present. Atherosclerotic plaque within the normal caliber aorta. Normal 3 vessel branching of the aortic arch. Slight tortuosity of the brachiocephalic vessels. No acute aortic or periaortic abnormality is seen. Central pulmonary arteries are normal caliber. No large central filling defects are identified on this non tailored examination of the pulmonary arteries. Mediastinum/Nodes: No mediastinal fluid  or gas. Normal thyroid gland and thoracic inlet. No acute abnormality of the trachea or esophagus. No worrisome mediastinal, hilar or axillary adenopathy. Lungs/Pleura: Some bandlike opacity in the mesial left lower lobe and minimally in the right lower lobe are most compatible with subsegmental atelectasis and/or scarring. No consolidation, features of edema, pneumothorax, or effusion. No suspicious  pulmonary nodules or masses. Musculoskeletal: No acute osseous abnormality or suspicious osseous lesion. No chest wall mass or suspicious soft tissue lesions identified. Bilateral gynecomastia. CT ABDOMEN FINDINGS Hepatobiliary: No focal liver abnormality is seen. No gallstones, gallbladder wall thickening, or biliary dilatation. Pancreas: Small bilobed cystic lesion possibly arising from the distal body of the pancreas versus the lesser curvature. Unchanged since exams as remote as 07/15/2017. No pancreatic ductal dilatation or surrounding inflammatory changes. Spleen: Normal in size without focal abnormality. Adrenals/Urinary Tract: Normal adrenal glands. Kidneys enhance and excrete symmetrically. Few subcentimeter hypoattenuating foci are too small to fully characterize on CT imaging but statistically likely benign. No obstructive urolithiasis or hydronephrosis. There is mild circumferential bladder wall thickening and perivesicular hazy stranding as well as periureteral stranding of the distal ureters bilaterally. Stomach/Bowel: Distal esophagus, stomach and duodenal sweep are unremarkable. No small bowel wall thickening or dilatation. No evidence of obstruction. A normal appendix is visualized. Proximal colon is unremarkable. Numerous distal colonic diverticula. No active diverticular inflammation. There is segmental thickening the proximal sigmoid colon a portion of the colon with numerous colonic diverticula which is similar to comparison exams and in the absence of adjacent pericolonic inflammation likely reflects chronic inflammatory change. Vascular/Lymphatic: Atherosclerotic plaque within the aorta and branch vessels. No aneurysm or ectasia. No acute periaortic abnormality. No suspicious or enlarged lymph nodes in the included lymphatic chains. Other: No free fluid. No free air. Small bilobed fluid collection measuring approximately 3 cm arising in the space between the pancreas and stomach, unclear  origin. Detailed above in the pancreatic section. No other abnormal fluid collections in the abdomen or pelvis. No abdominopelvic free air or fluid. Few gas lucencies in the subcutaneous tissues of the left lower quadrant likely injectable related. Mild body wall edema. No bowel containing hernia. Small fat containing left inguinal hernia. Musculoskeletal: Multilevel degenerative changes are present in the imaged portions of the spine. New anterior wedging compression deformity of L2 with approximately 40% height loss anteriorly. No adjacent fluid or hemorrhage. No other acute osseous abnormality is seen. Degenerative changes noted in the spine, hips and pelvis. IMPRESSION: 1. Mild circumferential bladder wall thickening and perivesicular hazy stranding as well as periureteral stranding of the distal ureters bilaterally. Findings are suspicious for cystitis and ascending urinary tract infection. Correlation with urinalysis is recommended. 2. Extensive distal colonic diverticulosis without evidence of acute diverticulitis. Segmental thickening of the proximal sigmoid colon may reflect chronic diverticular inflammation though correlation with outpatient colonoscopy is recommended. 3. New anterior wedging compression deformity of L2 with approximately 40% height loss anteriorly. No adjacent fluid or hemorrhage. 4. 3 cm bilobed fluid collection arising in the space between the pancreas and stomach, unclear origin but unchanged since January [id number] and almost certainly benign. Differential diagnosis includes small pseudocyst, exophytic side branch IPMN or possible gastric diverticulum among other etiologies. If felt to be clinically warranted could consider a nonemergent outpatient abdominal MRI with MRCP for further characterization. 5. Coronary artery calcifications. 6. Bilateral gynecomastia. 7. Small fat containing left inguinal hernia. 8. Mild body wall edema. Electronically Signed   By: Lovena Le M.D.   On:  09/05/2019 19:33  Scheduled Meds: . acyclovir  400 mg Oral BID  . aspirin EC  81 mg Oral QHS  . budesonide (PULMICORT) nebulizer solution  0.25 mg Nebulization BID  . cholecalciferol  2,000 Units Oral QHS  . enoxaparin (LOVENOX) injection  40 mg Subcutaneous Q24H  . guaiFENesin  600 mg Oral BID  . hydrocortisone  20 mg Oral BID  . loratadine  10 mg Oral Daily  . magnesium oxide  400 mg Oral Daily  . metoprolol tartrate  25 mg Oral BID  . montelukast  10 mg Oral Daily  . pantoprazole  40 mg Oral Daily  . rosuvastatin  10 mg Oral QHS  . senna-docusate  2 tablet Oral QHS    Continuous Infusions: . sodium chloride 75 mL/hr at 09/06/19 [id number]  . cefTRIAXone (ROCEPHIN)  IV 1 g (09/06/19 0650)     LOS: 2 days     Kayleen Memos, MD [facility] Pager [phone]  If 7PM-7AM, please contact night-coverage [email or web address] Password [facility] 09/06/2019, 2:43 PM

## 2019-09-07 ENCOUNTER — Inpatient Hospital Stay (HOSPITAL_COMMUNITY): Payer: Medicare Other

## 2019-09-07 DIAGNOSIS — E871 Hypo-osmolality and hyponatremia: Secondary | ICD-10-CM

## 2019-09-07 DIAGNOSIS — D469 Myelodysplastic syndrome, unspecified: Secondary | ICD-10-CM

## 2019-09-07 DIAGNOSIS — E2740 Unspecified adrenocortical insufficiency: Secondary | ICD-10-CM

## 2019-09-07 DIAGNOSIS — E875 Hyperkalemia: Secondary | ICD-10-CM

## 2019-09-07 DIAGNOSIS — Z91048 Other nonmedicinal substance allergy status: Secondary | ICD-10-CM

## 2019-09-07 DIAGNOSIS — Z7952 Long term (current) use of systemic steroids: Secondary | ICD-10-CM

## 2019-09-07 DIAGNOSIS — Z9481 Bone marrow transplant status: Secondary | ICD-10-CM

## 2019-09-07 DIAGNOSIS — M8448XA Pathological fracture, other site, initial encounter for fracture: Secondary | ICD-10-CM

## 2019-09-07 DIAGNOSIS — R112 Nausea with vomiting, unspecified: Secondary | ICD-10-CM

## 2019-09-07 LAB — CBC WITH DIFFERENTIAL/PLATELET
Abs Immature Granulocytes: 0.02 10*3/uL (ref 0.00–0.07)
Basophils Absolute: 0 10*3/uL (ref 0.0–0.1)
Basophils Relative: 0 %
Eosinophils Absolute: 0.3 10*3/uL (ref 0.0–0.5)
Eosinophils Relative: 4 %
HCT: 37.3 % — ABNORMAL LOW (ref 39.0–52.0)
Hemoglobin: 11.9 g/dL — ABNORMAL LOW (ref 13.0–17.0)
Immature Granulocytes: 0 %
Lymphocytes Relative: 43 %
Lymphs Abs: 2.8 10*3/uL (ref 0.7–4.0)
MCH: 29.5 pg (ref 26.0–34.0)
MCHC: 31.9 g/dL (ref 30.0–36.0)
MCV: 92.6 fL (ref 80.0–100.0)
Monocytes Absolute: 1 10*3/uL (ref 0.1–1.0)
Monocytes Relative: 14 %
Neutro Abs: 2.7 10*3/uL (ref 1.7–7.7)
Neutrophils Relative %: 39 %
Platelets: 339 10*3/uL (ref 150–400)
RBC: 4.03 MIL/uL — ABNORMAL LOW (ref 4.22–5.81)
RDW: 15.4 % (ref 11.5–15.5)
WBC: 6.8 10*3/uL (ref 4.0–10.5)
nRBC: 0 % (ref 0.0–0.2)

## 2019-09-07 LAB — URINE CULTURE: Culture: NO GROWTH

## 2019-09-07 LAB — COMPREHENSIVE METABOLIC PANEL
ALT: 16 U/L (ref 0–44)
AST: 17 U/L (ref 15–41)
Albumin: 3 g/dL — ABNORMAL LOW (ref 3.5–5.0)
Alkaline Phosphatase: 71 U/L (ref 38–126)
Anion gap: 7 (ref 5–15)
BUN: 12 mg/dL (ref 8–23)
CO2: 23 mmol/L (ref 22–32)
Calcium: 8.2 mg/dL — ABNORMAL LOW (ref 8.9–10.3)
Chloride: 107 mmol/L (ref 98–111)
Creatinine, Ser: 1 mg/dL (ref 0.61–1.24)
GFR calc Af Amer: >60 mL/min (ref >60)
GFR calc non Af Amer: >60 mL/min (ref >60)
Glucose, Bld: 87 mg/dL (ref 70–99)
Potassium: 3.8 mmol/L (ref 3.5–5.1)
Sodium: 137 mmol/L (ref 135–145)
Total Bilirubin: 0.5 mg/dL (ref 0.3–1.2)
Total Protein: 5.7 g/dL — ABNORMAL LOW (ref 6.5–8.1)

## 2019-09-07 LAB — GLUCOSE, CAPILLARY
Glucose-Capillary: 82 mg/dL (ref 70–99)
Glucose-Capillary: 83 mg/dL (ref 70–99)
Glucose-Capillary: 108 mg/dL — ABNORMAL HIGH (ref 70–99)

## 2019-09-07 LAB — PROCALCITONIN: Procalcitonin: 5.73 ng/mL

## 2019-09-07 LAB — MAGNESIUM: Magnesium: 1.9 mg/dL (ref 1.7–2.4)

## 2019-09-07 MED ORDER — SALINE SPRAY 0.65 % NA SOLN
1.0000 | NASAL | Status: DC | PRN
  Filled 2019-09-07: qty 44

## 2019-09-07 MED ORDER — GADOBUTROL 1 MMOL/ML IV SOLN
10.0000 mL | Freq: Once | INTRAVENOUS | Status: AC | PRN
  Administered 2019-09-07: 10 mL via INTRAVENOUS

## 2019-09-07 NOTE — Progress Notes (Addendum)
HEMATOLOGY-ONCOLOGY PROGRESS NOTE  SUBJECTIVE: Brandon [name] was admitted secondary to nausea and vomiting.  The patient has a history of MDS and is status post bone marrow transplant over 3 years ago.  He is followed at [facility].  Outside records reviewed through care everywhere.  His intractable nausea and vomiting were thought to be secondary to adrenal crisis.  He was noted to have leukocytosis on admission, but this has resolved.  He has had mild anemia this admission.    The patient reports he is feeling better today.  He is eating and drinking without much difficulty.  He states that he has had difficulty with symptoms like this in the past when his hydrocortisone dose comes down to around 10 to 15 mg twice a day. Most recently, he states that his dose was decreased down to 15 mg twice a day about 6 weeks ago.  He is not having any fevers.  No bleeding noted.  He has no other complaints today.  Oncology History  History of myelodysplastic syndrome  12/01/2015 Initial Diagnosis   MDS-RAEB-2: Bone marrow aspiration biopsy: Hypocellular 10-30% severity, increased blasts 60% on aspirate and 20% on biopsy by CD34 staining, flow failed to reveal increase in blasts (could be due to sampling), mild dysplasia megakaryocytes and erythrocyt   12/14/2015 Pathology Results   Dr.Horwitz ([facility]) Repeat bone marrow biopsy for FLT3 which is negative, morphologically blast count is 8-10%, however flow is showing 2% only. No evidence of numeric abnormalities or deletions involving chromosomes 5, 7, 8 or 20 by FISH. RAEB-I, intermediate II risk   12/19/2015 - 02/29/2016 Chemotherapy   Decitabine 3 cycles    04/17/2016 Procedure   Matched unrelated donor bone marrow transplant with busulfan/cyclophosphamide myeloablative conditioning; Complications: Esophagitis during conditioning, left upper extremity skin lesion treated with vancomycin, rash on the scalp, adrenal insufficiency fatigue and weight loss 05/27/2016 on  hydrocortisone replacement; CMV reactivation 09/27/2015 treated with ganciclovir   07/16/2016 Pathology Results   Day 90: Bone marrow biopsy: Trilineage hematopoiesis, no significant dysplasia, no increase in blasts greater than 98% donor      REVIEW OF SYSTEMS:   Constitutional: Denies fevers or abnormal weight loss Eyes: Denies blurriness of vision Ears, nose, mouth, throat, and face: Denies mucositis or sore throat Respiratory: Denies cough, dyspnea or wheezes Cardiovascular: Denies palpitation, chest discomfort Gastrointestinal: Nausea and vomiting improved today Skin: Denies abnormal skin rashes Lymphatics: Denies new lymphadenopathy or easy bruising Neurological:Denies numbness, tingling or new weaknesses Behavioral/Psych: Mood is stable, no new changes  Extremities: No lower extremity edema All other systems were reviewed with the patient and are negative.  I have reviewed the past medical history, past surgical history, social history and family history with the patient and they are unchanged from previous note.   PHYSICAL EXAMINATION: ECOG PERFORMANCE STATUS: 1 - Symptomatic but completely ambulatory  Vitals:   09/07/19 0552 09/07/19 0802  BP: 113/68   Pulse: (!) 55   Resp: 19   Temp: 98.1 F (36.7 C)   SpO2: 97% 97%   Filed Weights   09/05/19 0530 09/06/19 0550 09/07/19 0552  Weight: 109 kg 108.5 kg 105.8 kg    Intake/Output from previous day: 03/14 0701 - 03/15 0700 In: 1140 [P.O.:240; I.V.:900] Out: -   GENERAL:alert, no distress and comfortable SKIN: skin color, texture, turgor are normal, no rashes or significant lesions EYES: normal, Conjunctiva are pink and non-injected, sclera clear OROPHARYNX:no exudate, no erythema and lips, buccal mucosa, and tongue normal  LYMPH:  no palpable  lymphadenopathy in the cervical, axillary or inguinal LUNGS: clear to auscultation and percussion with normal breathing effort HEART: regular rate & rhythm and no murmurs  and no lower extremity edema ABDOMEN:abdomen soft, non-tender and normal bowel sounds Musculoskeletal:no cyanosis of digits and no clubbing  NEURO: alert & oriented x 3 with fluent speech, no focal motor/sensory deficits  LABORATORY DATA:  I have reviewed the data as listed CMP Latest Ref Rng & Units 09/07/2019 09/06/2019 09/05/2019  Glucose 70 - 99 mg/dL 87 105(H) 115(H)  BUN 8 - 23 mg/dL 12 15 22   Creatinine 0.61 - 1.24 mg/dL 1.00 1.06 1.06  Sodium 135 - 145 mmol/L 137 138 138  Potassium 3.5 - 5.1 mmol/L 3.8 4.1 4.1  Chloride 98 - 111 mmol/L 107 109 109  CO2 22 - 32 mmol/L 23 24 22   Calcium 8.9 - 10.3 mg/dL 8.2(L) 8.1(L) 8.2(L)  Total Protein 6.5 - 8.1 g/dL 5.7(L) 5.4(L) -  Total Bilirubin 0.3 - 1.2 mg/dL 0.5 0.7 -  Alkaline Phos 38 - 126 U/L 71 67 -  AST 15 - 41 U/L 17 15 -  ALT 0 - 44 U/L 16 16 -    Lab Results  Component Value Date   WBC 6.8 09/07/2019   HGB 11.9 (L) 09/07/2019   HCT 37.3 (L) 09/07/2019   MCV 92.6 09/07/2019   PLT 339 09/07/2019   NEUTROABS 2.7 09/07/2019    CT CHEST W CONTRAST  Result Date: 09/05/2019 CLINICAL DATA:  Nausea, vomiting, elevated white blood cell count, fever and chills the EXAM: CT CHEST AND ABDOMEN WITH CONTRAST TECHNIQUE: Multidetector CT imaging of the chest and abdomen was performed following the standard protocol during bolus administration of intravenous contrast. CONTRAST:  146m OMNIPAQUE IOHEXOL 300 MG/ML  SOLN COMPARISON:  CT chest, abdomen and pelvis 11/05/2010 FINDINGS: CT CHEST FINDINGS Cardiovascular: Normal heart size. No pericardial effusion. Few coronary artery calcifications are present. Atherosclerotic plaque within the normal caliber aorta. Normal 3 vessel branching of the aortic arch. Slight tortuosity of the brachiocephalic vessels. No acute aortic or periaortic abnormality is seen. Central pulmonary arteries are normal caliber. No large central filling defects are identified on this non tailored examination of the pulmonary  arteries. Mediastinum/Nodes: No mediastinal fluid or gas. Normal thyroid gland and thoracic inlet. No acute abnormality of the trachea or esophagus. No worrisome mediastinal, hilar or axillary adenopathy. Lungs/Pleura: Some bandlike opacity in the mesial left lower lobe and minimally in the right lower lobe are most compatible with subsegmental atelectasis and/or scarring. No consolidation, features of edema, pneumothorax, or effusion. No suspicious pulmonary nodules or masses. Musculoskeletal: No acute osseous abnormality or suspicious osseous lesion. No chest wall mass or suspicious soft tissue lesions identified. Bilateral gynecomastia. CT ABDOMEN FINDINGS Hepatobiliary: No focal liver abnormality is seen. No gallstones, gallbladder wall thickening, or biliary dilatation. Pancreas: Small bilobed cystic lesion possibly arising from the distal body of the pancreas versus the lesser curvature. Unchanged since exams as remote as 07/15/2017. No pancreatic ductal dilatation or surrounding inflammatory changes. Spleen: Normal in size without focal abnormality. Adrenals/Urinary Tract: Normal adrenal glands. Kidneys enhance and excrete symmetrically. Few subcentimeter hypoattenuating foci are too small to fully characterize on CT imaging but statistically likely benign. No obstructive urolithiasis or hydronephrosis. There is mild circumferential bladder wall thickening and perivesicular hazy stranding as well as periureteral stranding of the distal ureters bilaterally. Stomach/Bowel: Distal esophagus, stomach and duodenal sweep are unremarkable. No small bowel wall thickening or dilatation. No evidence of obstruction. A normal appendix  is visualized. Proximal colon is unremarkable. Numerous distal colonic diverticula. No active diverticular inflammation. There is segmental thickening the proximal sigmoid colon a portion of the colon with numerous colonic diverticula which is similar to comparison exams and in the absence  of adjacent pericolonic inflammation likely reflects chronic inflammatory change. Vascular/Lymphatic: Atherosclerotic plaque within the aorta and branch vessels. No aneurysm or ectasia. No acute periaortic abnormality. No suspicious or enlarged lymph nodes in the included lymphatic chains. Other: No free fluid. No free air. Small bilobed fluid collection measuring approximately 3 cm arising in the space between the pancreas and stomach, unclear origin. Detailed above in the pancreatic section. No other abnormal fluid collections in the abdomen or pelvis. No abdominopelvic free air or fluid. Few gas lucencies in the subcutaneous tissues of the left lower quadrant likely injectable related. Mild body wall edema. No bowel containing hernia. Small fat containing left inguinal hernia. Musculoskeletal: Multilevel degenerative changes are present in the imaged portions of the spine. New anterior wedging compression deformity of L2 with approximately 40% height loss anteriorly. No adjacent fluid or hemorrhage. No other acute osseous abnormality is seen. Degenerative changes noted in the spine, hips and pelvis. IMPRESSION: 1. Mild circumferential bladder wall thickening and perivesicular hazy stranding as well as periureteral stranding of the distal ureters bilaterally. Findings are suspicious for cystitis and ascending urinary tract infection. Correlation with urinalysis is recommended. 2. Extensive distal colonic diverticulosis without evidence of acute diverticulitis. Segmental thickening of the proximal sigmoid colon may reflect chronic diverticular inflammation though correlation with outpatient colonoscopy is recommended. 3. New anterior wedging compression deformity of L2 with approximately 40% height loss anteriorly. No adjacent fluid or hemorrhage. 4. 3 cm bilobed fluid collection arising in the space between the pancreas and stomach, unclear origin but unchanged since January 6301 and almost certainly benign.  Differential diagnosis includes small pseudocyst, exophytic side branch IPMN or possible gastric diverticulum among other etiologies. If felt to be clinically warranted could consider a nonemergent outpatient abdominal MRI with MRCP for further characterization. 5. Coronary artery calcifications. 6. Bilateral gynecomastia. 7. Small fat containing left inguinal hernia. 8. Mild body wall edema. Electronically Signed   By: Lovena Le M.D.   On: 09/05/2019 19:33   CT ABDOMEN PELVIS W CONTRAST  Result Date: 09/05/2019 CLINICAL DATA:  Nausea, vomiting, elevated white blood cell count, fever and chills the EXAM: CT CHEST AND ABDOMEN WITH CONTRAST TECHNIQUE: Multidetector CT imaging of the chest and abdomen was performed following the standard protocol during bolus administration of intravenous contrast. CONTRAST:  165m OMNIPAQUE IOHEXOL 300 MG/ML  SOLN COMPARISON:  CT chest, abdomen and pelvis 11/05/2010 FINDINGS: CT CHEST FINDINGS Cardiovascular: Normal heart size. No pericardial effusion. Few coronary artery calcifications are present. Atherosclerotic plaque within the normal caliber aorta. Normal 3 vessel branching of the aortic arch. Slight tortuosity of the brachiocephalic vessels. No acute aortic or periaortic abnormality is seen. Central pulmonary arteries are normal caliber. No large central filling defects are identified on this non tailored examination of the pulmonary arteries. Mediastinum/Nodes: No mediastinal fluid or gas. Normal thyroid gland and thoracic inlet. No acute abnormality of the trachea or esophagus. No worrisome mediastinal, hilar or axillary adenopathy. Lungs/Pleura: Some bandlike opacity in the mesial left lower lobe and minimally in the right lower lobe are most compatible with subsegmental atelectasis and/or scarring. No consolidation, features of edema, pneumothorax, or effusion. No suspicious pulmonary nodules or masses. Musculoskeletal: No acute osseous abnormality or suspicious  osseous lesion. No chest wall mass or  suspicious soft tissue lesions identified. Bilateral gynecomastia. CT ABDOMEN FINDINGS Hepatobiliary: No focal liver abnormality is seen. No gallstones, gallbladder wall thickening, or biliary dilatation. Pancreas: Small bilobed cystic lesion possibly arising from the distal body of the pancreas versus the lesser curvature. Unchanged since exams as remote as 07/15/2017. No pancreatic ductal dilatation or surrounding inflammatory changes. Spleen: Normal in size without focal abnormality. Adrenals/Urinary Tract: Normal adrenal glands. Kidneys enhance and excrete symmetrically. Few subcentimeter hypoattenuating foci are too small to fully characterize on CT imaging but statistically likely benign. No obstructive urolithiasis or hydronephrosis. There is mild circumferential bladder wall thickening and perivesicular hazy stranding as well as periureteral stranding of the distal ureters bilaterally. Stomach/Bowel: Distal esophagus, stomach and duodenal sweep are unremarkable. No small bowel wall thickening or dilatation. No evidence of obstruction. A normal appendix is visualized. Proximal colon is unremarkable. Numerous distal colonic diverticula. No active diverticular inflammation. There is segmental thickening the proximal sigmoid colon a portion of the colon with numerous colonic diverticula which is similar to comparison exams and in the absence of adjacent pericolonic inflammation likely reflects chronic inflammatory change. Vascular/Lymphatic: Atherosclerotic plaque within the aorta and branch vessels. No aneurysm or ectasia. No acute periaortic abnormality. No suspicious or enlarged lymph nodes in the included lymphatic chains. Other: No free fluid. No free air. Small bilobed fluid collection measuring approximately 3 cm arising in the space between the pancreas and stomach, unclear origin. Detailed above in the pancreatic section. No other abnormal fluid collections in the  abdomen or pelvis. No abdominopelvic free air or fluid. Few gas lucencies in the subcutaneous tissues of the left lower quadrant likely injectable related. Mild body wall edema. No bowel containing hernia. Small fat containing left inguinal hernia. Musculoskeletal: Multilevel degenerative changes are present in the imaged portions of the spine. New anterior wedging compression deformity of L2 with approximately 40% height loss anteriorly. No adjacent fluid or hemorrhage. No other acute osseous abnormality is seen. Degenerative changes noted in the spine, hips and pelvis. IMPRESSION: 1. Mild circumferential bladder wall thickening and perivesicular hazy stranding as well as periureteral stranding of the distal ureters bilaterally. Findings are suspicious for cystitis and ascending urinary tract infection. Correlation with urinalysis is recommended. 2. Extensive distal colonic diverticulosis without evidence of acute diverticulitis. Segmental thickening of the proximal sigmoid colon may reflect chronic diverticular inflammation though correlation with outpatient colonoscopy is recommended. 3. New anterior wedging compression deformity of L2 with approximately 40% height loss anteriorly. No adjacent fluid or hemorrhage. 4. 3 cm bilobed fluid collection arising in the space between the pancreas and stomach, unclear origin but unchanged since January 2248 and almost certainly benign. Differential diagnosis includes small pseudocyst, exophytic side branch IPMN or possible gastric diverticulum among other etiologies. If felt to be clinically warranted could consider a nonemergent outpatient abdominal MRI with MRCP for further characterization. 5. Coronary artery calcifications. 6. Bilateral gynecomastia. 7. Small fat containing left inguinal hernia. 8. Mild body wall edema. Electronically Signed   By: Lovena Le M.D.   On: 09/05/2019 19:33   DG CHEST PORT 1 VIEW  Result Date: 09/04/2019 CLINICAL DATA:  Nausea and  vomiting and chills for 24 hours. EXAM: PORTABLE CHEST 1 VIEW COMPARISON:  Chest x-ray 11/05/2018 FINDINGS: The cardiopericardial silhouette is prominent but appears stable and most of this appears to be epicardial fat on a prior chest CT. Mild tortuosity of the thoracic aorta. The lungs are clear of an acute process. No pleural effusions or worrisome pulmonary lesions. The bony  thorax is intact. IMPRESSION: No acute cardiopulmonary findings. Electronically Signed   By: Marijo Sanes M.D.   On: 09/04/2019 06:29   VAS [place] LOWER EXTREMITY VENOUS (DVT)  Result Date: 09/05/2019  Lower Venous DVTStudy Indications: Pain, and Swelling.  Limitations: Poor ultrasound/tissue interface. Comparison Study: No prior exam. Performing Technologist: Baldwin Crown ARDMS, RVT  Examination Guidelines: A complete evaluation includes B-mode imaging, spectral Doppler, color Doppler, and power Doppler as needed of all accessible portions of each vessel. Bilateral testing is considered an integral part of a complete examination. Limited examinations for reoccurring indications may be performed as noted. The reflux portion of the exam is performed with the patient in reverse Trendelenburg.  +---------+---------------+---------+-----------+----------+-----------------+ RIGHT    CompressibilityPhasicitySpontaneityPropertiesThrombus Aging    +---------+---------------+---------+-----------+----------+-----------------+ CFV      Full           Yes      Yes                                    +---------+---------------+---------+-----------+----------+-----------------+ SFJ      Full                                                           +---------+---------------+---------+-----------+----------+-----------------+ FV Prox  Full                                                           +---------+---------------+---------+-----------+----------+-----------------+ FV Mid   Full                                                            +---------+---------------+---------+-----------+----------+-----------------+ FV DistalFull                                                           +---------+---------------+---------+-----------+----------+-----------------+ PFV      Full                                                           +---------+---------------+---------+-----------+----------+-----------------+ POP      Full           Yes      Yes                                    +---------+---------------+---------+-----------+----------+-----------------+ PTV      Full  poorly visualized +---------+---------------+---------+-----------+----------+-----------------+ PERO     Full                                         poorly visualized +---------+---------------+---------+-----------+----------+-----------------+   +---------+---------------+---------+-----------+----------+-----------------+ LEFT     CompressibilityPhasicitySpontaneityPropertiesThrombus Aging    +---------+---------------+---------+-----------+----------+-----------------+ CFV      Full           Yes      Yes                                    +---------+---------------+---------+-----------+----------+-----------------+ SFJ      Full                                                           +---------+---------------+---------+-----------+----------+-----------------+ FV Prox  Full                                                           +---------+---------------+---------+-----------+----------+-----------------+ FV Mid   Full                                                           +---------+---------------+---------+-----------+----------+-----------------+ FV DistalFull                                         poorly visualized +---------+---------------+---------+-----------+----------+-----------------+ PFV      Full                                                            +---------+---------------+---------+-----------+----------+-----------------+ POP      Full           Yes      Yes                                    +---------+---------------+---------+-----------+----------+-----------------+ PTV      Full                                         poorly visualized +---------+---------------+---------+-----------+----------+-----------------+ PERO     Full                                         poorly visualized +---------+---------------+---------+-----------+----------+-----------------+     Summary: BILATERAL: - No evidence of  deep vein thrombosis seen in the lower extremities, bilaterally.  RIGHT: - Portions of this examination were limited- see technologist comments above. - No cystic structure found in the popliteal fossa.  LEFT: - Portions of this examination were limited- see technologist comments above. - No cystic structure found in the popliteal fossa.  *See table(s) above for measurements and observations. Electronically signed by Harold Barban MD on 09/05/2019 at 7:07:14 PM.    Final     ASSESSMENT AND PLAN: 1.  Myelodysplastic syndrome, status post bone marrow transplant 2.  Adrenal crisis secondary to tapering of chronic steroids 3.  Intractable nausea vomiting secondary to #2 4.  Presumptive UTI 5.  History of fungal pneumonia  -Chart has been reviewed and outside records from [facility] of been reviewed.  The patient remained stable with regards to his MDS.  He will follow-up as an outpatient at [facility]. -Cortef dose was being tapered to 15 mg twice daily.  He received stress dose steroids on admission with improvement of his symptoms.  Continue current dose of Cortef per hospitalist.  He will have outpatient follow-up with endocrinology at [facility]. -Continue as needed antiemetics. -Antibiotics for presumptive UTI per hospitalist/ID   LOS: 3 days   Mikey Bussing, DNP, AGPCNP-BC,  AOCNP 09/07/19  Attending Note  I personally saw the patient, reviewed the chart and examined the patient. The plan of care was discussed with the patient  . I agree with the assessment and plan as documented above. Thank you very much for the consultation.  Prior history of MDS status post allogenic stem cell transplant: I reviewed the peripheral smear there is no abnormalities suggestive of recurrence of MDS or leukemia. Intractable nausea and vomiting: All the specialist seem to be in consensus that it is related to adrenal insufficiency. There is no other oncological issues at this current time. We will sign off.

## 2019-09-07 NOTE — Care Management Important Message (Signed)
Important Message  Patient Details IM Letter given to Nancy Marus RN Case Manager to present to the Patient Name: Brandon [name] MRN: [id number] Date of Birth: 1950/10/20   Medicare Important Message Given:  Yes     Brandon [name] 09/07/2019, 11:33 AM

## 2019-09-07 NOTE — Progress Notes (Signed)
PROGRESS NOTE  Brandon [name] [id number] DOB: 06/23/1951 DOA: 09/03/2019 PCP: [name], MD  HPI/Recap of past 24 hours: Brandon [name] is a 69 y.o. 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  Denies any abdominal pain or diarrhea.  Over the weekend had some subjective feeling of fever and chills.  Has chronic cough nonproductive.  Symptoms subsided without any intervention.  Patient called his endocrinologist and was advised to take increased dose of his hydrocortisone.   Due to persistent vomiting he presents to the ER.  Patient has had previous history of fungal pneumonia which was treated with Fluconazole.  Covid test was negative.  Labs show WBC count of 17.8 creatinine 1.5 increased from baseline of normal last October and 1.1 in January 2021.  Patient was given 100 mg IV hydrocortisone stress dose and IV fluid bolus and admitted for nausea vomiting likely from adrenal crisis.  Unclear trigger for suspected adrenal crisis.  Contact his transplant oncologist Dr. Edwena [name] at [phone].  CT chest/abd/pelvis with cntrast completed on 3/14. Suspicion for cystitis and ascending urinary tract infection.  Urine culture obtained and started on Rocephin empirically.  09/07/19: Seen and examined.  Reports intermittent nausea with no vomiting.  Overall feels better than prior to admission.  There is a possibility that his reduced dose of Cortef may have caused his adrenal crisis.  We consulted ID to assist with the management, to rule out any possible causes of his adrenal crisis.  Discussed case with Dr. Edwena [name] from [facility], the patient's transplant oncologist, highly appreciate her assistance.    Assessment/Plan: Principal Problem:   Nausea & vomiting Active  Problems:   Essential hypertension   History of stem cell transplant ([place])   Adrenocortical insufficiency (HCC)   History of CVA (cerebrovascular accident)   Adrenal crisis syndrome (HCC)  Intractable nausea vomiting suspected secondary to adrenal crisis Presented with nausea vomiting, called and was advised by his endocrinologist to go to the ER His Cortef dose was being weaned by his endocrinologist and was down to 15 mg BID Received stress dose steroids on admission Cortef dose increased to 20 mg BID, seems to do ok with this dose, not hypotensive, MAP is stable. LFTs unremarkable Continue current dose of Cortef Continue antiemetics as needed Continue supportive care  Adrenal crisis with unclear trigger Management as stated above ID consulted to assist with ruling out any possible cause or trigger for his adrenal crisis Oncology also consulted to assist with the latter. Would it be 2/2 to weaning dose of his cortef, PTA?  CT Chest, abd/pelvis w contrast completed 3/13, please kindly review radiology report.  Presumptive UTI CT abdomen and pelvis 3/13 with concern for cystitis and possible ascending urinary tract infection Urine culture no growth Started on Rocephin empirically 3/14>> ID Dr. Tommy Medal will see in consultation  Resolved AKI  Baseline creatinine appears to be 1.0 with GFR > 60 Presented with creatinine of 1.54 Creatinine is back to his baseline Continue to Avoid nephrotoxins and hypotension Continue to monitor urine output  Elevated procalcitonin, improving Presented with procalcitonin 27 Procalcitonin is trending down to 10>> 5 Blood cultures no growth to date. Urine cx no growth No evidence of pneumonia on CT chest  Resolved leukocytosis possibly reactive in the setting of steroid use Leukocytosis resolved Afebrile  History of CVA On aspirin and statin, continue Continue PT OT with assistance and fall precautions  History of fungal pneumonia  No  acute issues Not on antifungal therapies O2 saturation 95% on room air  History of MDS/AML status post stem cell transplant Discussed with patient's transplant oncologist Dr. Edwena [name] from [facility] on 09/05/19 and 09/07/19 via phone.  Highly appreciate her assistance.  DVT prophylaxis: Lovenox subcu daily. Code Status: Full code. Family Communication:   We will call family if it is ok with the patient.  Disposition Plan: Patient is from home.  Anticipate discharge to home in the next 24 to 48 hours when ID and oncology sign off.  Barrier to discharge ongoing workup for adrenal crisis.   . Consults called: Oncology Dr. Lindi Adie and ID Dr. Tommy Medal.      Objective: Vitals:   09/06/19 2010 09/06/19 2015 09/07/19 0552 09/07/19 0802  BP: 138/81  113/68   Pulse: 66  (!) 55   Resp: 20  19   Temp: 98.5 F (36.9 C)  98.1 F (36.7 C)   TempSrc: Oral  Oral   SpO2: 96% 95% 97% 97%  Weight:   105.8 kg   Height:        Intake/Output Summary (Last 24 hours) at 09/07/2019 1106 Last data filed at 09/07/2019 1020 Gross per 24 hour  Intake 1260 ml  Output --  Net 1260 ml   Filed Weights   09/05/19 0530 09/06/19 0550 09/07/19 0552  Weight: 109 kg 108.5 kg 105.8 kg    Exam:  . General: 69 y.o. year-old male Pleasant WD WN NAD A&O x 3 . Cardiovascular: RRR no rubs or gallops . Respiratory: CTA no wheezes or rales .  Abdomen: Soft NT ND NBS . Musculoskeletal: No LE edema . Psychiatry: Mood is appropriate  Data Reviewed: CBC: Recent Labs  Lab 09/03/19 1950 09/04/19 0528 09/05/19 0409 09/06/19 0644 09/07/19 0820  WBC 17.8* 13.1* 9.2 7.3 6.8  NEUTROABS  --   --  6.0 3.6 2.7  HGB 16.0 11.9* 11.4* 11.4* 11.9*  HCT 50.4 36.2* 35.6* 36.0* 37.3*  MCV 91.5 91.4 92.5 93.5 92.6  PLT 428* 301 297 295 [id number]   Basic Metabolic Panel: Recent Labs  Lab 09/03/19 1950 09/04/19 0528 09/05/19 0409 09/06/19 0644 09/07/19 0820  NA 141 137 138 138 137  K 4.7 4.7 4.1 4.1 3.8  CL 106 107  109 109 107  CO2 22 21* 22 24 23   GLUCOSE 145* 134* 115* 105* 87  BUN 20 26* 22 15 12   CREATININE 1.54* 1.42* 1.06 1.06 1.00  CALCIUM 9.5 7.8* 8.2* 8.1* 8.2*  MG  --   --   --   --  1.9   GFR: Estimated Creatinine Clearance: 83.7 mL/min (by C-G formula based on SCr of 1 mg/dL). Liver Function Tests: Recent Labs  Lab 09/03/19 1950 09/06/19 0644 09/07/19 0820  AST 30 15 17   ALT 22 16 16   ALKPHOS 114 67 71  BILITOT 0.8 0.7 0.5  PROT 7.9 5.4* 5.7*  ALBUMIN 4.2 2.8* 3.0*   Recent Labs  Lab 09/03/19 1950  LIPASE 22   No results for input(s): AMMONIA in the last 168 hours. Coagulation Profile: No results for input(s): INR, PROTIME in the last 168 hours. Cardiac Enzymes: No results for input(s): CKTOTAL, CKMB, CKMBINDEX, TROPONINI in the last 168 hours. BNP (last 3 results) No results for input(s): PROBNP in the last 8760 hours. HbA1C: No results for input(s): HGBA1C in the last  72 hours. CBG: Recent Labs  Lab 09/06/19 0801 09/06/19 1135 09/06/19 1526 09/06/19 2116 09/07/19 0815  GLUCAP 90 85 126* 83 82   Lipid Profile: No results for input(s): CHOL, HDL, LDLCALC, TRIG, CHOLHDL, LDLDIRECT in the last 72 hours. Thyroid Function Tests: No results for input(s): TSH, T4TOTAL, FREET4, T3FREE, THYROIDAB in the last 72 hours. Anemia Panel: No results for input(s): VITAMINB12, FOLATE, FERRITIN, TIBC, IRON, RETICCTPCT in the last 72 hours. Urine analysis:    Component Value Date/Time   COLORURINE YELLOW 09/04/2019 0230   APPEARANCEUR CLEAR 09/04/2019 0230   LABSPEC 1.017 09/04/2019 0230   PHURINE 5.0 09/04/2019 0230   GLUCOSEU NEGATIVE 09/04/2019 0230   GLUCOSEU NEGATIVE 12/23/2009 0742   HGBUR NEGATIVE 09/04/2019 0230   HGBUR negative 01/24/2007 0810   BILIRUBINUR NEGATIVE 09/04/2019 0230   BILIRUBINUR negative 08/10/2014 1057   BILIRUBINUR n 02/11/2013 1059   KETONESUR 20 (A) 09/04/2019 0230   PROTEINUR NEGATIVE 09/04/2019 0230   UROBILINOGEN 0.2 08/10/2014 1057    UROBILINOGEN 0.2 12/23/2009 0742   NITRITE NEGATIVE 09/04/2019 0230   LEUKOCYTESUR NEGATIVE 09/04/2019 0230   Sepsis Labs: @LABRCNTIP (procalcitonin:4,lacticidven:4)  ) Recent Results (from the past 240 hour(s))  SARS CORONAVIRUS 2 (TAT 6-24 HRS) Nasopharyngeal Nasopharyngeal Swab     Status: None   Collection Time: 09/03/19 10:07 PM   Specimen: Nasopharyngeal Swab  Result Value Ref Range Status   SARS Coronavirus 2 NEGATIVE NEGATIVE Final    Comment: (NOTE) SARS-CoV-2 target nucleic acids are NOT DETECTED. The SARS-CoV-2 RNA is generally detectable in upper and lower respiratory specimens during the acute phase of infection. Negative results do not preclude SARS-CoV-2 infection, do not rule out co-infections with other pathogens, and should not be used as the sole basis for treatment or other patient management decisions. Negative results must be combined with clinical observations, patient history, and epidemiological information. The expected result is Negative. Fact Sheet for Patients: SugarRoll.be Fact Sheet for Healthcare Providers: https://www.woods-mathews.com/ This test is not yet approved or cleared by the [place] FDA and  has been authorized for detection and/or diagnosis of SARS-CoV-2 by FDA under an Emergency Use Authorization (EUA). This EUA will remain  in effect (meaning this test can be used) for the duration of the COVID-19 declaration under Section 56 4(b)(1) of the Act, 21 U.S.C. section 360bbb-3(b)(1), unless the authorization is terminated or revoked sooner. Performed at [facility], [place] [address], [place], [place] [address]   Culture, blood (routine x 2)     Status: None (Preliminary result)   Collection Time: 09/04/19  6:23 AM   Specimen: BLOOD  Result Value Ref Range Status   Specimen Description BLOOD LEFT ANTECUBITAL  Final   Special Requests   Final    BOTTLES DRAWN AEROBIC AND ANAEROBIC  Blood Culture adequate volume Performed at [facility] [address] [place], [place] [address]    Culture NO GROWTH 3 DAYS  Final   Report Status PENDING  Incomplete      Studies: No results found.  Scheduled Meds: . acyclovir  400 mg Oral BID  . aspirin EC  81 mg Oral QHS  . budesonide (PULMICORT) nebulizer solution  0.25 mg Nebulization BID  . cholecalciferol  2,000 Units Oral QHS  . enoxaparin (LOVENOX) injection  40 mg Subcutaneous Q24H  . guaiFENesin  600 mg Oral BID  . hydrocortisone  20 mg Oral BID  . loratadine  10 mg Oral Daily  . magnesium oxide  400 mg Oral  Daily  . metoprolol tartrate  25 mg Oral BID  . montelukast  10 mg Oral Daily  . pantoprazole  40 mg Oral Daily  . rosuvastatin  10 mg Oral QHS  . senna-docusate  2 tablet Oral QHS    Continuous Infusions: . cefTRIAXone (ROCEPHIN)  IV 1 g (09/07/19 0903)     LOS: 3 days     Kayleen Memos, MD [facility] Pager [phone]  If 7PM-7AM, please contact night-coverage [email or web address] Password [facility] 09/07/2019, 11:06 AM

## 2019-09-07 NOTE — Consult Note (Signed)
Date of Admission:  09/03/2019          Reason for Consult: workup for ID cause of AI    Referring Provider: Dr. Nevada Crane   Assessment:  1. Adrenal crisis secondary to tapering of chronic steroids 2. MDS sp bone marrow transplant and GVDHD, adrenal insufficiency 3. Hx of "fungal pneumonia" though the patient himself does not recall having fungal pneumonia but merely being on posaconazole for prophylaxis 4. Compression fracture in lumbar spine but is new 5. CT scan findings suggesting urinary tract infection but no urinary symptoms and no pyuria and a negative urine culture   Plan:  1. Discontinue ceftriaxone 2. Check MRI of lumbar spine to make sure that this compression fracture does not infectious pathology 3. Will check serum cryptococcal antigen and urine histoplasma antigen and Blastomyces antigen 4.   Principal Problem:   Nausea & vomiting Active Problems:   Essential hypertension   History of stem cell transplant ([place])   Adrenocortical insufficiency (HCC)   History of CVA (cerebrovascular accident)   Adrenal crisis syndrome (HCC)   Scheduled Meds: . acyclovir  400 mg Oral BID  . aspirin EC  81 mg Oral QHS  . budesonide (PULMICORT) nebulizer solution  0.25 mg Nebulization BID  . cholecalciferol  2,000 Units Oral QHS  . enoxaparin (LOVENOX) injection  40 mg Subcutaneous Q24H  . guaiFENesin  600 mg Oral BID  . hydrocortisone  20 mg Oral BID  . loratadine  10 mg Oral Daily  . magnesium oxide  400 mg Oral Daily  . metoprolol tartrate  25 mg Oral BID  . montelukast  10 mg Oral Daily  . pantoprazole  40 mg Oral Daily  . rosuvastatin  10 mg Oral QHS  . senna-docusate  2 tablet Oral QHS   Continuous Infusions: PRN Meds:.acetaminophen **OR** acetaminophen, albuterol, ondansetron **OR** ondansetron (ZOFRAN) IV, polyvinyl alcohol, sodium chloride  HPI: Brandon [name] is a 69 y.o. male who has a history of myelodysplastic syndrome status post bone marrow transplant at  [facility] in 2017.  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  He has had troubles with bouts of adrenal sufficiency particularly when corticosteroids have been tapered.  He was again admitted to the ER on 12 March after having some subjective fever and chills worsening nausea persistent vomiting.  In the ER he was tachycardic his renal function was worse he was hyponatremic and hyperkalemic white cells were up a bit but he just increased his baseline to corticosteroids he was given hydrocortisone here and has had improvement in all of his symptoms.  We were asked to see him to help work-up infectious precipitants of his adrenal insufficiency.  Imaging of his abdomen pelvis with CT scan had shown evidence of some bladder thickening though he does not have pyuria and does not have urinary symptoms at all.  He does have some symptoms chronically of sinus congestion but does not appear to have a sinus infection at present.  Other findings include a possible pancreatic pseudocyst seen he also has a compression fracture that is new and was not present on prior CT scan in 2020.  He has had his full COVID-19 but 19 vaccination series.  I am very skeptical that there is an infectious precipitant of his adrenal insufficiency.  I think his renal sufficiency came about with his corticosteroids being tapered as seems to be going on with him in the past.  We distantly work him up for infectious pathology and I will order an MRI of his lumbar spine to make sure that there is no evidence of discitis vertebral osteomyelitis associated with that vertebral compression fracture in.  I did check a serum cryptococcal antigen urine histoplasma and Blastomyces antigens.  Again he does not recall having a fungal pneumonia but states that he was taking posaconazole for prophylaxis.     Review of Systems: Review of Systems    Constitutional: Positive for chills. Negative for fever, malaise/fatigue and weight loss.  HENT: Negative for congestion and sore throat.   Eyes: Negative for blurred vision and photophobia.  Respiratory: Negative for cough, shortness of breath and wheezing.   Cardiovascular: Negative for chest pain, palpitations and leg swelling.  Gastrointestinal: Positive for diarrhea, nausea and vomiting. Negative for abdominal pain, blood in stool, constipation, heartburn and melena.  Genitourinary: Negative for dysuria, flank pain and hematuria.  Musculoskeletal: Positive for myalgias. Negative for back pain, falls and joint pain.  Skin: Negative for itching and rash.  Neurological: Positive for dizziness and weakness. Negative for focal weakness, loss of consciousness and headaches.  Endo/Heme/Allergies: Does not bruise/bleed easily.  Psychiatric/Behavioral: Negative for depression and suicidal ideas. The patient does not have insomnia.     Past Medical History:  Diagnosis Date  . Adrenocortical insufficiency ([place]) 08/06/2017   Patient with 2 hospitalizations in January 2019- treated with fluids, antibiotics, steroids- ultimately thought most likely due to adrenal insufficiency with no obvious source of infection found  . Allergy    seasonal/animals  . Chronic prostatitis 12/17/2008  . Diverticulosis 01/16/2011   History of diverticulitis   . ERECTILE DYSFUNCTION 02/07/2007  . HYPERLIPIDEMIA 02/04/2007  . HYPERTENSION 02/04/2007  . NEOPLASM, MALIGNANT, SKIN, BACK 02/09/2009  . Pneumonia, viral 12/2017  . S/P bone marrow transplant [facility])     Social History   Tobacco Use  . Smoking status: Never Smoker  . Smokeless tobacco: Never Used  Substance Use Topics  . Alcohol use: Yes    Alcohol/week: 3.0 standard drinks    Types: 3 Standard drinks or equivalent per week  . Drug use: No    Family History  Problem Relation Age of Onset  . Breast cancer Mother   . CVA Mother        quit taking HTN  meds  . Alzheimer's disease Father        late 22s. mid 32s  . Asperger's syndrome Son    Allergies  Allergen Reactions  . Tape Other (See Comments)    Skin irritation that may cause a scab if left on too long.     OBJECTIVE: Blood pressure 121/73, pulse 62, temperature 98 F (36.7 C), temperature source Oral, resp. rate 18, height 5\' 10"  (1.778 m), weight 105.8 kg, SpO2 98 %.  Physical Exam Constitutional:      Appearance: He is well-developed.  HENT:     Head: Normocephalic and atraumatic.  Eyes:     Conjunctiva/sclera: Conjunctivae normal.  Cardiovascular:     Rate and Rhythm: Normal rate and regular rhythm.  Pulmonary:     Effort: Pulmonary effort is normal. No respiratory distress.     Breath sounds: No wheezing.  Abdominal:     General: There is no distension.     Palpations: Abdomen is soft.  Musculoskeletal:        General: No tenderness. Normal range of motion.     Cervical back: Normal range of motion and neck supple.  Skin:  General: Skin is warm and dry.     Coloration: Skin is not pale.     Findings: No erythema or rash.  Neurological:     General: No focal deficit present.     Mental Status: He is alert and oriented to person, place, and time.  Psychiatric:        Mood and Affect: Mood normal.        Behavior: Behavior normal.        Thought Content: Thought content normal.        Judgment: Judgment normal.     Lab Results Lab Results  Component Value Date   WBC 6.8 09/07/2019   HGB 11.9 (L) 09/07/2019   HCT 37.3 (L) 09/07/2019   MCV 92.6 09/07/2019   PLT 339 09/07/2019    Lab Results  Component Value Date   CREATININE 1.00 09/07/2019   BUN 12 09/07/2019   NA 137 09/07/2019   K 3.8 09/07/2019   CL 107 09/07/2019   CO2 23 09/07/2019    Lab Results  Component Value Date   ALT 16 09/07/2019   AST 17 09/07/2019   ALKPHOS 71 09/07/2019   BILITOT 0.5 09/07/2019     Microbiology: Recent Results (from the past 240 hour(s))  SARS  CORONAVIRUS 2 (TAT 6-24 HRS) Nasopharyngeal Nasopharyngeal Swab     Status: None   Collection Time: 09/03/19 10:07 PM   Specimen: Nasopharyngeal Swab  Result Value Ref Range Status   SARS Coronavirus 2 NEGATIVE NEGATIVE Final    Comment: (NOTE) SARS-CoV-2 target nucleic acids are NOT DETECTED. The SARS-CoV-2 RNA is generally detectable in upper and lower respiratory specimens during the acute phase of infection. Negative results do not preclude SARS-CoV-2 infection, do not rule out co-infections with other pathogens, and should not be used as the sole basis for treatment or other patient management decisions. Negative results must be combined with clinical observations, patient history, and epidemiological information. The expected result is Negative. Fact Sheet for Patients: SugarRoll.be Fact Sheet for Healthcare Providers: https://www.woods-mathews.com/ This test is not yet approved or cleared by the [place] FDA and  has been authorized for detection and/or diagnosis of SARS-CoV-2 by FDA under an Emergency Use Authorization (EUA). This EUA will remain  in effect (meaning this test can be used) for the duration of the COVID-19 declaration under Section 56 4(b)(1) of the Act, 21 U.S.C. section 360bbb-3(b)(1), unless the authorization is terminated or revoked sooner. Performed at [facility], [place] [address] [place], [place] [address]   Culture, blood (routine x 2)     Status: None (Preliminary result)   Collection Time: 09/04/19  6:23 AM   Specimen: BLOOD  Result Value Ref Range Status   Specimen Description BLOOD LEFT ANTECUBITAL  Final   Special Requests   Final    BOTTLES DRAWN AEROBIC AND ANAEROBIC Blood Culture adequate volume Performed at [facility] [address] [place], [place] [address]    Culture NO GROWTH 3 DAYS  Final   Report Status PENDING  Incomplete  Culture, Urine     Status: None    Collection Time: 09/06/19  7:49 AM   Specimen: Urine, Random  Result Value Ref Range Status   Specimen Description   Final    URINE, RANDOM Performed at [facility] [address], [place],  [address]    Special Requests   Final    Immunocompromised Performed at [facility], [place] [address]  [place], [place] [address]    Culture   Final    NO GROWTH Performed at [facility], [place] [address], [place], [place] [address]    Report Status 09/07/2019 FINAL  Final    Alcide Evener, [place] [facility] [phone] pager  09/07/2019, 2:13 PM

## 2019-09-08 ENCOUNTER — Encounter (HOSPITAL_COMMUNITY): Payer: Self-pay | Admitting: Emergency Medicine

## 2019-09-08 ENCOUNTER — Other Ambulatory Visit: Payer: Self-pay

## 2019-09-08 ENCOUNTER — Inpatient Hospital Stay (HOSPITAL_COMMUNITY)
Admission: EM | Admit: 2019-09-08 | Discharge: 2019-09-14 | Disposition: A | Discharge status: Home / Self Care | Payer: Medicare Other | Source: Home / Self Care | Attending: Internal Medicine | Admitting: Internal Medicine

## 2019-09-08 DIAGNOSIS — E2740 Unspecified adrenocortical insufficiency: Secondary | ICD-10-CM | POA: Diagnosis present

## 2019-09-08 DIAGNOSIS — E272 Addisonian crisis: Principal | ICD-10-CM | POA: Diagnosis present

## 2019-09-08 DIAGNOSIS — K219 Gastro-esophageal reflux disease without esophagitis: Secondary | ICD-10-CM | POA: Diagnosis present

## 2019-09-08 DIAGNOSIS — Z862 Personal history of diseases of the blood and blood-forming organs and certain disorders involving the immune mechanism: Secondary | ICD-10-CM

## 2019-09-08 DIAGNOSIS — Z86718 Personal history of other venous thrombosis and embolism: Secondary | ICD-10-CM

## 2019-09-08 DIAGNOSIS — M8440XA Pathological fracture, unspecified site, initial encounter for fracture: Secondary | ICD-10-CM

## 2019-09-08 DIAGNOSIS — E785 Hyperlipidemia, unspecified: Secondary | ICD-10-CM | POA: Diagnosis present

## 2019-09-08 DIAGNOSIS — R112 Nausea with vomiting, unspecified: Secondary | ICD-10-CM

## 2019-09-08 LAB — CRYPTOCOCCAL ANTIGEN: Crypto Ag: NEGATIVE

## 2019-09-08 LAB — CBC
HCT: 49.1 % (ref 39.0–52.0)
Hemoglobin: 15.8 g/dL (ref 13.0–17.0)
MCH: 29.1 pg (ref 26.0–34.0)
MCHC: 32.2 g/dL (ref 30.0–36.0)
MCV: 90.4 fL (ref 80.0–100.0)
Platelets: 418 10*3/uL — ABNORMAL HIGH (ref 150–400)
RBC: 5.43 MIL/uL (ref 4.22–5.81)
RDW: 15.4 % (ref 11.5–15.5)
WBC: 21.9 10*3/uL — ABNORMAL HIGH (ref 4.0–10.5)
nRBC: 0 % (ref 0.0–0.2)

## 2019-09-08 LAB — COMPREHENSIVE METABOLIC PANEL
ALT: 28 U/L (ref 0–44)
AST: 30 U/L (ref 15–41)
Albumin: 3.9 g/dL (ref 3.5–5.0)
Alkaline Phosphatase: 141 U/L — ABNORMAL HIGH (ref 38–126)
Anion gap: 11 (ref 5–15)
BUN: 18 mg/dL (ref 8–23)
CO2: 21 mmol/L — ABNORMAL LOW (ref 22–32)
Calcium: 9.2 mg/dL (ref 8.9–10.3)
Chloride: 105 mmol/L (ref 98–111)
Creatinine, Ser: 1.36 mg/dL — ABNORMAL HIGH (ref 0.61–1.24)
GFR calc Af Amer: >60 mL/min (ref >60)
GFR calc non Af Amer: 52 mL/min — ABNORMAL LOW (ref >60)
Glucose, Bld: 144 mg/dL — ABNORMAL HIGH (ref 70–99)
Potassium: 4.2 mmol/L (ref 3.5–5.1)
Sodium: 137 mmol/L (ref 135–145)
Total Bilirubin: 0.7 mg/dL (ref 0.3–1.2)
Total Protein: 7.3 g/dL (ref 6.5–8.1)

## 2019-09-08 LAB — URINALYSIS, ROUTINE W REFLEX MICROSCOPIC
Bacteria, UA: NONE SEEN
Bilirubin Urine: NEGATIVE
Glucose, UA: NEGATIVE mg/dL
Hgb urine dipstick: NEGATIVE
Ketones, ur: NEGATIVE mg/dL
Nitrite: NEGATIVE
Protein, ur: NEGATIVE mg/dL
Specific Gravity, Urine: 1.011 (ref 1.005–1.030)
pH: 5 (ref 5.0–8.0)

## 2019-09-08 LAB — GLUCOSE, CAPILLARY
Glucose-Capillary: 114 mg/dL — ABNORMAL HIGH (ref 70–99)
Glucose-Capillary: 83 mg/dL (ref 70–99)
Glucose-Capillary: 128 mg/dL — ABNORMAL HIGH (ref 70–99)

## 2019-09-08 LAB — LIPASE, BLOOD: Lipase: 23 U/L (ref 11–51)

## 2019-09-08 LAB — PROCALCITONIN: Procalcitonin: 2.69 ng/mL

## 2019-09-08 LAB — CBG MONITORING, ED: Glucose-Capillary: 115 mg/dL — ABNORMAL HIGH (ref 70–99)

## 2019-09-08 MED ORDER — HYDROCORTISONE 20 MG PO TABS: 20.0000 mg | ORAL_TABLET | Freq: Two times a day (BID) | ORAL | 0 refills | Status: DC

## 2019-09-08 MED ORDER — HYDROCORTISONE NA SUCCINATE PF 100 MG IJ SOLR
100.0000 mg | Freq: Once | INTRAMUSCULAR | Status: AC
  Administered 2019-09-08: 100 mg via INTRAVENOUS
  Filled 2019-09-08: qty 2

## 2019-09-08 MED ORDER — SODIUM CHLORIDE 0.9% FLUSH: 3.0000 mL | Freq: Once | INTRAVENOUS | Status: DC

## 2019-09-08 MED ORDER — METOCLOPRAMIDE HCL 5 MG/ML IJ SOLN
5.0000 mg | Freq: Once | INTRAMUSCULAR | Status: AC
  Administered 2019-09-08: 5 mg via INTRAVENOUS
  Filled 2019-09-08: qty 2

## 2019-09-08 MED ORDER — ONDANSETRON 4 MG PO TBDP
4.0000 mg | ORAL_TABLET | Freq: Once | ORAL | Status: AC | PRN
  Administered 2019-09-08: 4 mg via ORAL
  Filled 2019-09-08: qty 1

## 2019-09-08 MED ORDER — SODIUM CHLORIDE 0.9 % IV SOLN: INTRAVENOUS | Status: DC

## 2019-09-08 MED ORDER — SODIUM CHLORIDE 0.9 % IV BOLUS
1000.0000 mL | Freq: Once | INTRAVENOUS | Status: AC
  Administered 2019-09-08: 1000 mL via INTRAVENOUS

## 2019-09-08 NOTE — H&P (Signed)
History and Physical   Brandon [name] [id number] DOB: [birth year] DOA: 09/08/2019  Referring MD/NP/PA: Dr. Zenia Resides  PCP: [name], MD   Outpatient Specialists: Dr. Lindi Adie, oncologist,  Patient coming from: Home  Chief Complaint: Nausea with vomiting  HPI: Brandon [name] is a 69 y.o. 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 Denies any abdominal pain or diarrhea.  Patient was admitted with exactly the same complaint and discharged today.  She he had significant work-up and evaluation.  He was seen by both oncologist as well as infectious disease.  His endocrinologist and transplant physician at [facility] were also involved.  He was being tapered off of his steroids to his standard dose.  He was discharged home on 20 mg of oral hydrocortisone twice a day.  On arrival home he started having nausea and vomiting and abdominal discomfort.  This is typical of his usual adrenal crisis.  He is becoming more dehydrated and feels weak.  He came back to the ER where he is being readmitted with another adrenal crisis most likely secondary to tapering of his steroids down,.  ED Course: Temperature 98.8 blood pressure 160/53 pulse 114 respirate 20 oxygen sats 92% on room air.  White count 21.9, platelets 418 creatinine 1.36 CO2 21 BUN 18.  Other work-up essentially within normal.  Patient is being readmitted with recurrent adrenal crisis.  Review of Systems: As per HPI otherwise 10 point review of systems negative.    Past Medical History:  Diagnosis Date  . Adrenocortical insufficiency ([place]) 08/06/2017   Patient with 2 hospitalizations in January 2019- treated with fluids, antibiotics, steroids- ultimately thought most  likely due to adrenal insufficiency with no obvious source of infection found  . Allergy    seasonal/animals  . Chronic prostatitis 12/17/2008  . Diverticulosis 01/16/2011   History of diverticulitis   . ERECTILE DYSFUNCTION 02/07/2007  . HYPERLIPIDEMIA 02/04/2007  . HYPERTENSION 02/04/2007  . NEOPLASM, MALIGNANT, SKIN, BACK 02/09/2009  . Pneumonia, viral 12/2017  . S/P bone marrow transplant [facility])     Past Surgical History:  Procedure Laterality Date  . CATARACT EXTRACTION Left 06/2018  . none       reports that he has never smoked. He has never used smokeless tobacco. He reports current alcohol use of about 3.0 standard drinks of alcohol per week. He reports that he does not use drugs.  Allergies  Allergen Reactions  . Tape Other (See Comments)    Skin irritation that may cause a scab if left on too long.     Family History  Problem Relation Age of Onset  . Breast cancer Mother   . CVA Mother        quit taking HTN meds  . Alzheimer's disease Father        late 8s. mid 33s  . Asperger's syndrome Son      Prior to Admission medications   Medication Sig Start Date End Date Taking? Authorizing Provider  acyclovir (ZOVIRAX) 400 MG tablet Take 400 mg by mouth 2 (two) times daily.   Yes [provider]  albuterol (PROVENTIL HFA;VENTOLIN HFA) 108 (90 Base) MCG/ACT inhaler Inhale 1-2 puffs into the lungs every 6 (six) hours as needed for wheezing or shortness of breath.    Yes  [provider]  aspirin EC 325 MG tablet Take 325 mg by mouth at bedtime.    Yes [provider]  Cholecalciferol (VITAMIN D) 50 MCG (2000 UT) CAPS Take 2,000 Units by mouth at bedtime.    Yes [provider]  diphenhydrAMINE HCl (BENADRYL ALLERGY PO) Take 1 tablet by mouth at bedtime as needed (sleep).    Yes [provider]  famotidine (PEPCID) 20 MG tablet Take 20 mg by mouth daily.   Yes [provider]  fluticasone (FLONASE) 50 MCG/ACT nasal spray  Place 2 sprays into both nostrils daily as needed for allergies or rhinitis.   Yes [provider]  fluticasone (FLOVENT HFA) 220 MCG/ACT inhaler Inhale 2 puffs into the lungs 2 (two) times daily.    Yes [provider]  guaiFENesin (MUCINEX) 600 MG 12 hr tablet Take 600 mg by mouth daily.    Yes [provider]  hydrocortisone (CORTEF) 20 MG tablet Take 1 tablet (20 mg total) by mouth 2 (two) times daily. 09/08/19 10/08/19 Yes Kayleen Memos, DO  hydrocortisone cream 1 % Apply 1 application topically as needed for itching.   Yes [provider]  loratadine (CLARITIN) 10 MG tablet Take 10 mg by mouth daily.   Yes [provider]  LORazepam (ATIVAN) 0.5 MG tablet Take 0.5 mg by mouth every 4 (four) hours as needed for anxiety.   Yes [provider]  Magnesium Oxide 400 MG CAPS Take 1 capsule (400 mg total) by mouth 5 (five) times daily. Patient taking differently: Take 400 mg by mouth daily.  09/03/16  Yes Nicholas Lose, MD  Melatonin 3 MG TBDP Take 3 mg by mouth at bedtime as needed (sleep).    Yes [provider]  metFORMIN (GLUCOPHAGE) 500 MG tablet Take 0.5 tablets (250 mg total) by mouth 2 (two) times daily with a meal. Patient taking differently: Take 500 mg by mouth 2 (two) times daily with a meal.  07/27/19  Yes [name], MD  metoprolol tartrate (LOPRESSOR) 25 MG tablet Take 1 tablet by mouth 2 (two) times daily. 01/20/19  Yes [provider]  montelukast (SINGULAIR) 10 MG tablet Take 10 mg by mouth daily.    Yes [provider]  Multiple Vitamin (MULTI-VITAMIN) tablet Take by mouth.   Yes [provider]  Omeprazole 20 MG TBEC Take 20 mg by mouth daily.  08/25/18  Yes [provider]  ondansetron (ZOFRAN-ODT) 8 MG disintegrating tablet Take 8 mg by mouth every 8 (eight) hours as needed for nausea or vomiting.   Yes [provider]  polyethylene glycol (MIRALAX) 17 g packet Take 1  packet by mouth daily as needed (constipation).    Yes [provider]  polyvinyl alcohol (LIQUIFILM TEARS) 1.4 % ophthalmic solution Place 1 drop into both eyes as needed for dry eyes.   Yes [provider]  rosuvastatin (CRESTOR) 10 MG tablet Take 10 mg by mouth at bedtime.  03/03/19  Yes [provider]  senna-docusate (SENOKOT-S) 8.6-50 MG tablet Take 2 tablets by mouth at bedtime.    Yes [provider]  Lancets (ONETOUCH DELICA PLUS [id number]) [place]  07/14/18   [provider]  oxycodone (OXY-IR) 5 MG capsule Take 5 mg by mouth every 6 (six) hours as needed for pain.    [provider]    Physical Exam: Vitals:   09/08/19 2019 09/08/19 2208 09/08/19 2215 09/08/19 2258  BP: (!) 116/53 119/75 119/67 119/67  Pulse: (!) 107 (!) 114 (!) 113 (!) 105  Resp: 20 18  20   Temp: (!) 97.5 F (36.4 C)     TempSrc: Oral     SpO2: 92% 93% 93% 94%  Weight:      Height:          Constitutional: Anxious, continuously eating ice, no distress Vitals:   09/08/19 2019 09/08/19 2208 09/08/19 2215 09/08/19 2258  BP: (!) 116/53 119/75 119/67 119/67  Pulse: (!) 107 (!) 114 (!) 113 (!) 105  Resp: 20 18  20   Temp: (!) 97.5 F (36.4 C)     TempSrc: Oral     SpO2: 92% 93% 93% 94%  Weight:      Height:       Eyes: PERRL, lids and conjunctivae normal ENMT: Mucous membranes are dry. Posterior pharynx clear of any exudate or lesions.Normal dentition.  Neck: normal, supple, no masses, no thyromegaly Respiratory: clear to auscultation bilaterally, no wheezing, no crackles. Normal respiratory effort. No accessory muscle use.  Cardiovascular: Sinus tachycardia, no murmurs / rubs / gallops. No extremity edema. 2+ pedal pulses. No carotid bruits.  Abdomen: no tenderness, no masses palpated. No hepatosplenomegaly. Bowel sounds positive.  Musculoskeletal: no clubbing / cyanosis. No joint deformity upper and lower extremities. Good ROM, no contractures. Normal  muscle tone.  Skin: Flushed skin, dry skin and mucous membranes no rashes, lesions, ulcers. No induration Neurologic: CN 2-12 grossly intact. Sensation intact, DTR normal. Strength 5/5 in all 4.  Psychiatric: Normal judgment and insight. Alert and oriented x 3. Normal mood.     Labs on Admission: I have personally reviewed following labs and imaging studies  CBC: Recent Labs  Lab 09/04/19 0528 09/05/19 0409 09/06/19 0644 09/07/19 0820 09/08/19 1935  WBC 13.1* 9.2 7.3 6.8 21.9*  NEUTROABS  --  6.0 3.6 2.7  --   HGB 11.9* 11.4* 11.4* 11.9* 15.8  HCT 36.2* 35.6* 36.0* 37.3* 49.1  MCV 91.4 92.5 93.5 92.6 90.4  PLT 301 297 295 339 [id number]*   Basic Metabolic Panel: Recent Labs  Lab 09/04/19 0528 09/05/19 0409 09/06/19 0644 09/07/19 0820 09/08/19 1935  NA 137 138 138 137 137  K 4.7 4.1 4.1 3.8 4.2  CL 107 109 109 107 105  CO2 21* 22 24 23  21*  GLUCOSE 134* 115* 105* 87 144*  BUN 26* 22 15 12 18   CREATININE 1.42* 1.06 1.06 1.00 1.36*  CALCIUM 7.8* 8.2* 8.1* 8.2* 9.2  MG  --   --   --  1.9  --    GFR: Estimated Creatinine Clearance: 61.6 mL/min (A) (by C-G formula based on SCr of 1.36 mg/dL (H)). Liver Function Tests: Recent Labs  Lab 09/03/19 1950 09/06/19 0644 09/07/19 0820 09/08/19 1935  AST 30 15 17 30   ALT 22 16 16 28   ALKPHOS 114 67 71 141*  BILITOT 0.8 0.7 0.5 0.7  PROT 7.9 5.4* 5.7* 7.3  ALBUMIN 4.2 2.8* 3.0* 3.9   Recent Labs  Lab 09/03/19 1950 09/08/19 1935  LIPASE 22 23   No results for input(s): AMMONIA in the last 168 hours. Coagulation Profile: No results for input(s): INR, PROTIME in the last 168 hours. Cardiac Enzymes: No results for input(s): CKTOTAL, CKMB, CKMBINDEX, TROPONINI in the last 168 hours. BNP (last 3 results) No results for input(s): PROBNP in the last 8760 hours. HbA1C: No results for input(s): HGBA1C in the last 72 hours. CBG: Recent Labs  Lab 09/07/19 1653 09/07/19 2038 09/08/19 0741 09/08/19 1303 09/08/19  2018    GLUCAP 108* 114* 83 128* 115*   Lipid Profile: No results for input(s): CHOL, HDL, LDLCALC, TRIG, CHOLHDL, LDLDIRECT in the last 72 hours. Thyroid Function Tests: No results for input(s): TSH, T4TOTAL, FREET4, T3FREE, THYROIDAB in the last 72 hours. Anemia Panel: No results for input(s): VITAMINB12, FOLATE, FERRITIN, TIBC, IRON, RETICCTPCT in the last 72 hours. Urine analysis:    Component Value Date/Time   COLORURINE YELLOW 09/08/2019 2120   APPEARANCEUR HAZY (A) 09/08/2019 2120   LABSPEC 1.011 09/08/2019 2120   PHURINE 5.0 09/08/2019 2120   GLUCOSEU NEGATIVE 09/08/2019 2120   GLUCOSEU NEGATIVE 12/23/2009 0742   HGBUR NEGATIVE 09/08/2019 2120   HGBUR negative 01/24/2007 0810   BILIRUBINUR NEGATIVE 09/08/2019 2120   BILIRUBINUR negative 08/10/2014 1057   BILIRUBINUR n 02/11/2013 1059   KETONESUR NEGATIVE 09/08/2019 2120   PROTEINUR NEGATIVE 09/08/2019 2120   UROBILINOGEN 0.2 08/10/2014 1057   UROBILINOGEN 0.2 12/23/2009 0742   NITRITE NEGATIVE 09/08/2019 2120   LEUKOCYTESUR SMALL (A) 09/08/2019 2120   Sepsis Labs: @LABRCNTIP (procalcitonin:4,lacticidven:4) ) Recent Results (from the past 240 hour(s))  SARS CORONAVIRUS 2 (TAT 6-24 HRS) Nasopharyngeal Nasopharyngeal Swab     Status: None   Collection Time: 09/03/19 10:07 PM   Specimen: Nasopharyngeal Swab  Result Value Ref Range Status   SARS Coronavirus 2 NEGATIVE NEGATIVE Final    Comment: (NOTE) SARS-CoV-2 target nucleic acids are NOT DETECTED. The SARS-CoV-2 RNA is generally detectable in upper and lower respiratory specimens during the acute phase of infection. Negative results do not preclude SARS-CoV-2 infection, do not rule out co-infections with other pathogens, and should not be used as the sole basis for treatment or other patient management decisions. Negative results must be combined with clinical observations, patient history, and epidemiological information. The expected result is Negative. Fact Sheet for  Patients: Sugar[employer].be Fact Sheet for Healthcare Providers: https://www.woods-mathews.com/ This test is not yet approved or cleared by the [place] FDA and  has been authorized for detection and/or diagnosis of SARS-CoV-2 by FDA under an Emergency Use Authorization (EUA). This EUA will remain  in effect (meaning this test can be used) for the duration of the COVID-19 declaration under Section 56 4(b)(1) of the Act, 21 U.S.C. section 360bbb-3(b)(1), unless the authorization is terminated or revoked sooner. Performed at [facility], [place] [address], [place], [place] [address]   Culture, blood (routine x 2)     Status: None (Preliminary result)   Collection Time: 09/04/19  6:23 AM   Specimen: BLOOD  Result Value Ref Range Status   Specimen Description   Final    BLOOD LEFT ANTECUBITAL Performed at [place] [address], [place], [place] [address]    Special Requests   Final    BOTTLES DRAWN AEROBIC AND ANAEROBIC Blood Culture adequate volume Performed at [place] [address], [place], [place] [address]    Culture   Final    NO GROWTH 4 DAYS Performed at [facility], [place] [address], [place], [place] [address]    Report Status PENDING  Incomplete  Culture, Urine     Status: None   Collection Time: 09/06/19  7:49 AM   Specimen: Urine, Random  Result Value Ref Range Status   Specimen Description   Final    URINE, RANDOM Performed at [facility] [address], [place], [place] [address]    Special Requests   Final    Immunocompromised Performed at [facility]  [place] [address] [place], [place] [address]    Culture   Final    NO GROWTH Performed at [facility], [place] [address] [place], [place] [address]    Report Status 09/07/2019 FINAL  Final     Radiological Exams on Admission: MR Lumbar Spine W Wo Contrast  Result  Date: 09/07/2019 CLINICAL DATA:  Low back pain. Evaluate for compression fracture. EXAM: MRI LUMBAR SPINE WITHOUT AND WITH CONTRAST TECHNIQUE: Multiplanar and multiecho pulse sequences of the lumbar spine were obtained without and with intravenous contrast. CONTRAST:  93mL GADAVIST GADOBUTROL 1 MMOL/ML IV SOLN COMPARISON:  None. FINDINGS: Segmentation:  Standard. Alignment: Grade 1 anterolisthesis of L4 on L5 secondary to facet disease. 2 mm retrolisthesis of L2 on L3. Vertebrae: No acute fracture. Chronic L2 vertebral body compression fracture with approximately 10% height loss. No discitis or osteomyelitis. No aggressive osseous lesion. Conus medullaris and cauda equina: Conus extends to the T12 level. Conus and cauda equina appear normal. Paraspinal and other soft tissues: No acute paraspinal abnormality. Disc levels: Disc spaces: Degenerative disease with disc height loss at T11-12, T12-L1, L1-2, and L5-S1. T12-L1: No significant disc bulge. No evidence of neural foraminal stenosis. No central canal stenosis. L1-L2: Broad-based disc bulge. Mild bilateral facet arthropathy. Right subarticular recess narrowing. No evidence of neural foraminal stenosis. Mild spinal stenosis. L2-L3: Broad-based disc bulge. Moderate bilateral facet arthropathy. Moderate spinal stenosis. Bilateral subarticular recess stenosis. No foraminal stenosis. L3-L4: Broad-based disc bulge. Mild bilateral facet arthropathy. No evidence of neural foraminal stenosis. No central canal stenosis. L4-L5: Broad-based disc bulge. Severe bilateral facet arthropathy. Moderate spinal stenosis and bilateral subarticular recess stenosis. No evidence of neural foraminal stenosis. L5-S1: Broad-based disc bulge with a small central disc protrusion. Moderate bilateral facet arthropathy. Mild bilateral foraminal narrowing. No evidence of neural foraminal stenosis. No central canal stenosis. IMPRESSION: 1. Diffuse lumbar spine spondylosis as described above. 2. No  acute osseous injury of the lumbar spine. Electronically Signed   By: Kathreen Devoid   On: 09/07/2019 17:05      Assessment/Plan Principal Problem:   Adrenal crisis [facility]) Active Problems:   Hyperlipidemia   GERD (gastroesophageal reflux disease)   History of myelodysplastic syndrome   Adrenocortical insufficiency ([place])   History of DVT (deep vein thrombosis)     #1 adrenal crisis: Most likely related to steroid taper.  Patient most likely requires higher dose of steroid at baseline.  We will readmit the patient.  I will use IV cortisone for now.  Titrate down to the orals when he is more stable.  Consider conversation with his endocrinologist and transplant surgeon regarding appropriate dosing.  #2 history of myelodysplastic syndrome: Status post bone marrow transplant.  Again continue per oncology and his transplant surgeons.  #3 history of DVT: We will order DVT prophylaxis.  #4 diabetes: Steroid induced.  Sliding scale insulin.  Has been on Metformin at home.  #4 GERD: Continue with PPIs.   DVT prophylaxis: Lovenox Code Status: Full code Family Communication: No family at bedside Disposition Plan: Home Consults called: None tonight but consult oncology in the morning Admission status: Inpatient  Severity of Illness: The appropriate patient status for this patient is INPATIENT. Inpatient status is judged to be reasonable and necessary in order to provide the required intensity of service to ensure the patient's safety. The patient's presenting symptoms, physical exam findings, and initial radiographic and laboratory data in the context of their chronic comorbidities is felt to place them  at high risk for further clinical deterioration. Furthermore, it is not anticipated that the patient will be medically stable for discharge from the hospital within 2 midnights of admission. The following factors support the patient status of inpatient.   " The patient's presenting symptoms  include nausea with vomiting. " The worrisome physical exam findings include dry mucous membranes. " The initial radiographic and laboratory data are worrisome because of leukocytosis. " The chronic co-morbidities include adrenal insufficiency.   * I certify that at the point of admission it is my clinical judgment that the patient will require inpatient hospital care spanning beyond 2 midnights from the point of admission due to high intensity of service, high risk for further deterioration and high frequency of surveillance required.Barbette Merino MD [facility] Pager [phone]  If 7PM-7AM, please contact night-coverage [email or web address] Password [facility]  09/08/2019, 11:24 PM

## 2019-09-08 NOTE — Discharge Summary (Signed)
Discharge Summary  Brandon [name] [id number] DOB: [birth year]  PCP: [name], MD  Admit date: 09/03/2019 Discharge date: 09/08/2019  Time spent: 35 minutes  Recommendations for Outpatient Follow-up:  1. Follow-up with your oncologist 2. Follow-up with your endocrinologist 3. Follow-up with your PCP 4. Take your medications as prescribed.  Discharge Diagnoses:  Active Hospital Problems   Diagnosis Date Noted  . Nausea & vomiting 09/03/2019  . Adrenal crisis syndrome ([place]) 09/04/2019  . History of CVA (cerebrovascular accident) 03/10/2019  . Adrenocortical insufficiency ([place]) 08/06/2017  . History of stem cell transplant ([place]) 03/04/2017  . Essential hypertension 02/04/2007    Resolved Hospital Problems  No resolved problems to display.    Discharge Condition: Stable.  Diet recommendation: Resume previous diet.  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 Denies any abdominal pain or diarrhea. Over the weekend had some subjective feeling of fever and chills. Symptoms subsided without any intervention. Patient called his endocrinologist and was advised to take increased dose of his hydrocortisone.  Due to persistent vomiting he presents to the ER.   Covid test was negative. Labs show WBC count of 17.8 K creatinine 1.5 increased from baseline of normal last October and 1.1 in January 2021. Patient was given 100 mg IV hydrocortisone stress dose and IV fluid bolus and admitted for nausea vomiting likely from adrenal crisis.  Unclear trigger for his adrenal crisis.  Was started on Cortef  20 mg BID with improvement of his symptomatology.  CT chest/abd/pelvis with contrast completed on 09/06/18 to rule out an occult infection.  Findings suggestive of cystitis and ascending urinary tract infection.  Urine culture negative.  ID consulted.  MRI lumbar spine ordered due to L2 compression fracture>> no evidence of active infective process.  Seen by oncology>>Reviewed the peripheral smear no evidence of recurrence of MDS or Leukemia.   Discussed case with Dr. [name] from [facility], the patient's transplant oncologist, highly appreciate her assistance.  09/08/19:  Seen and examined.  No new complaints.  No acute events overnight.  ID and oncology signed off.  Adrenal crisis thought to be secondary to steroid tapering.   Hospital Course:  Principal Problem:   Nausea & vomiting Active Problems:   Essential hypertension   History of stem cell transplant ([place])   Adrenocortical insufficiency ([facility])   History of CVA (cerebrovascular accident)   Adrenal crisis syndrome ([facility])  Resolved Intractable nausea vomiting secondary to adrenal crisis Presented with nausea vomiting, called and was advised by his endocrinologist to go to the ER His Cortef dose was being weaned by his endocrinologist and was down to 10 mg in the AM and 5 mg qhs Received stress dose steroids on admission Cortef dose increased to 20 mg BID with improvement of symptomatology BP, MAP stable LFTs unremarkable Continue current dose of Cortef Continue antiemetics as needed Follow up with your oncologist Dr. [name] and endocrinologist at [facility].  Adrenal crisis secondary to tapering of chronic steroids ID consulted to assist with ruling out any other possible causes for his adrenal crisis.  No active infective process. Seen  by Oncology, reviewed the peripheral smear no evidence of recurrence of MDS or Leukemia.  CT Chest, abd/pelvis w contrast completed 3/13:  1. Mild circumferential bladder wall thickening and  perivesicular hazy stranding as well as periureteral stranding of the distal ureters bilaterally. Findings are suspicious for cystitis and ascending urinary tract infection. Correlation with urinalysis is recommended. 2. Extensive distal colonic diverticulosis without evidence of acute diverticulitis. Segmental thickening of the proximal sigmoid colon may reflect chronic diverticular inflammation though correlation with outpatient colonoscopy is recommended. 3. New anterior wedging compression deformity of L2 with approximately 40% height loss anteriorly. No adjacent fluid or hemorrhage. 4. 3 cm bilobed fluid collection arising in the space between the pancreas and stomach, unclear origin but unchanged since January [id number] and almost certainly benign. Differential diagnosis includes small pseudocyst, exophytic side branch IPMN or possible gastric diverticulum among other etiologies. If felt to be clinically warranted could consider a nonemergent outpatient abdominal MRI with MRCP for further characterization. 5. Coronary artery calcifications. 6. Bilateral gynecomastia. 7. Small fat containing left inguinal hernia. 8. Mild body wall edema.  MRI Lumbar spine W/Wo contrast Segmentation:  Standard.  Alignment: Grade 1 anterolisthesis of L4 on L5 secondary to facet disease. 2 mm retrolisthesis of L2 on L3.  Vertebrae: No acute fracture. Chronic L2 vertebral body compression fracture with approximately 10% height loss. No discitis or osteomyelitis. No aggressive osseous lesion.  Conus medullaris and cauda equina: Conus extends to the T12 level. Conus and cauda equina appear normal.  Paraspinal and other soft tissues: No acute paraspinal abnormality.  Disc levels:  Disc spaces: Degenerative disease with disc height loss at T11-12, T12-L1, L1-2, and L5-S1.  T12-L1: No significant disc bulge. No evidence of neural foraminal stenosis. No central canal stenosis.  L1-L2:  Broad-based disc bulge. Mild bilateral facet arthropathy. Right subarticular recess narrowing. No evidence of neural foraminal stenosis. Mild spinal stenosis.  L2-L3: Broad-based disc bulge. Moderate bilateral facet arthropathy. Moderate spinal stenosis. Bilateral subarticular recess stenosis. No foraminal stenosis.  L3-L4: Broad-based disc bulge. Mild bilateral facet arthropathy. No evidence of neural foraminal stenosis. No central canal stenosis.  L4-L5: Broad-based disc bulge. Severe bilateral facet arthropathy. Moderate spinal stenosis and bilateral subarticular recess stenosis. No evidence of neural foraminal stenosis.  L5-S1: Broad-based disc bulge with a small central disc protrusion. Moderate bilateral facet arthropathy. Mild bilateral foraminal narrowing. No evidence of neural foraminal stenosis. No central canal stenosis.  1. Diffuse lumbar spine spondylosis as described above. 2. No acute osseous injury of the lumbar spine.  Presumptive UTI CT abdomen and pelvis 3/13 with concern for cystitis and possible ascending urinary tract infection Urine culture no growth Started on Rocephin empirically 3/14>>Dced on 09/07/19. ID Dr. Tommy Medal saw in consultation>> No active infective process  Resolved AKI  Baseline creatinine appears to be 1.0 with GFR > 60 Presented with creatinine of 1.54 Creatinine is back to his baseline Continue to Avoid nephrotoxins and hypotension Continue to monitor urine output  Elevated procalcitonin, improving Presented with procalcitonin 27 Procalcitonin is trending down to 27.39>> 10.73>> 5.73>> 2.69 Blood cultures no growth to date. Urine cx no growth No evidence of pneumonia on CT chest  Resolved leukocytosis possibly reactive in the setting of steroid use Leukocytosis resolved Afebrile Management per above  History of CVA On aspirin and statin, continue Continue PT OT with assistance and fall precautions  History of MDS  status post stem cell transplant Discussed with patient's transplant oncologist Dr. [name] from [facility] on 09/05/19 and 09/07/19 via  phone.  Highly appreciate her assistance. Follow up with your oncologist   Code Status:Full code.   . Consults called:Oncology Dr. Lindi Adie and ID Dr. Tommy Medal.     Discharge Exam: BP 124/86   Pulse 72   Temp 98.1 F (36.7 C)   Resp (!) 21   Ht 5\' 10"  (1.778 m)   Wt 105.8 kg   SpO2 96%   BMI 33.47 kg/m  . General: 69 y.o. year-old male well developed well nourished in no acute distress.  Alert and oriented x3. . Cardiovascular: Regular rate and rhythm with no rubs or gallops.  No thyromegaly or JVD noted.   Marland Kitchen Respiratory: Clear to auscultation with no wheezes or rales. Good inspiratory effort. . Abdomen: Soft nontender nondistended with normal bowel sounds x4 quadrants. . Musculoskeletal: No lower extremity edema. 2/4 pulses in all 4 extremities. Marland Kitchen Psychiatry: Mood is appropriate for condition and setting  Discharge Instructions You were cared for by a hospitalist during your hospital stay. If you have any questions about your discharge medications or the care you received while you were in the hospital after you are discharged, you can call the unit and asked to speak with the hospitalist on call if the hospitalist that took care of you is not available. Once you are discharged, your primary care physician will handle any further medical issues. Please note that NO REFILLS for any discharge medications will be authorized once you are discharged, as it is imperative that you return to your primary care physician (or establish a relationship with a primary care physician if you do not have one) for your aftercare needs so that they can reassess your need for medications and monitor your lab values.   Allergies as of 09/08/2019      Reactions   Tape Other (See Comments)   Skin irritation that may cause a scab if left on too long.         Medication List    STOP taking these medications   amLODipine 10 MG tablet Commonly known as: NORVASC     TAKE these medications   acyclovir 400 MG tablet Commonly known as: ZOVIRAX Take 400 mg by mouth 2 (two) times daily.   albuterol 108 (90 Base) MCG/ACT inhaler Commonly known as: VENTOLIN HFA Inhale 1-2 puffs into the lungs every 6 (six) hours as needed for wheezing or shortness of breath.   aspirin EC 325 MG tablet Take 81 mg by mouth at bedtime.   BENADRYL ALLERGY PO Take by mouth at bedtime as needed.   Claritin 10 MG tablet Generic drug: loratadine Take 10 mg by mouth daily.   famotidine 20 MG tablet Commonly known as: PEPCID Take 20 mg by mouth daily.   fluticasone 220 MCG/ACT inhaler Commonly known as: FLOVENT HFA Inhale 2 puffs into the lungs 2 (two) times daily.   fluticasone 50 MCG/ACT nasal spray Commonly known as: FLONASE Place 2 sprays into both nostrils daily as needed for allergies or rhinitis.   hydrocortisone 20 MG tablet Commonly known as: CORTEF Take 1 tablet (20 mg total) by mouth 2 (two) times daily. What changed:   medication strength  how much to take  when to take this  additional instructions   hydrocortisone cream 1 % Apply 1 application topically as needed for itching.   LORazepam 0.5 MG tablet Commonly known as: ATIVAN Take 0.5 mg by mouth every 4 (four) hours as needed for anxiety.   Magnesium Oxide 400 MG Caps Take 1  capsule (400 mg total) by mouth 5 (five) times daily. What changed: when to take this   Melatonin 3 MG Tbdp Take by mouth at bedtime as needed.   metFORMIN 500 MG tablet Commonly known as: GLUCOPHAGE Take 0.5 tablets (250 mg total) by mouth 2 (two) times daily with a meal. What changed: how much to take   metoprolol tartrate 25 MG tablet Commonly known as: LOPRESSOR Take 1 tablet by mouth 2 (two) times daily.   MIRALAX PO Take by mouth.   montelukast 10 MG tablet Commonly known as:  SINGULAIR Take 10 mg by mouth daily.   MUCINEX PO Take by mouth.   Multi-Vitamin tablet Take by mouth.   Omeprazole 20 MG Tbec Take 20 mg by mouth daily.   ondansetron 8 MG disintegrating tablet Commonly known as: ZOFRAN-ODT Take 8 mg by mouth every 8 (eight) hours as needed for nausea or vomiting.   OneTouch Delica Plus 0000000 Misc   oxycodone 5 MG capsule Commonly known as: OXY-IR Take 5 mg by mouth every 6 (six) hours as needed for pain.   polyvinyl alcohol 1.4 % ophthalmic solution Commonly known as: LIQUIFILM TEARS Place 1 drop into both eyes as needed for dry eyes.   rosuvastatin 10 MG tablet Commonly known as: CRESTOR Take 10 mg by mouth at bedtime.   senna-docusate 8.6-50 MG tablet Commonly known as: Senokot-S Take 2 tablets by mouth at bedtime.   Vitamin D 50 MCG (2000 UT) Caps Take 2,000 Units by mouth at bedtime.      Allergies  Allergen Reactions  . Tape Other (See Comments)    Skin irritation that may cause a scab if left on too long.    Follow-up Information    [name], MD. Call in 1 day(s).   Specialty: Family Medicine Why: Please call for a post hospital follow-up appointment. Contact information: [place] [address] [phone]        Nicholas Lose, MD. Call in 1 day(s).   Specialty: Oncology Why: Please call for a post hospital follow-up appointment. Contact information: [place] [address] [phone]        Liz Malady Diprincipe, MD. Call in 1 day(s).   Specialty: Endocrinology Why: Please call for a post hospital follow-up appointment. Contact information: [place] [address] [phone]            The results of significant diagnostics from this hospitalization (including imaging, microbiology, ancillary and laboratory) are listed below for reference.    Significant Diagnostic Studies: CT CHEST W CONTRAST  Result Date:  09/05/2019 CLINICAL DATA:  Nausea, vomiting, elevated white blood cell count, fever and chills the EXAM: CT CHEST AND ABDOMEN WITH CONTRAST TECHNIQUE: Multidetector CT imaging of the chest and abdomen was performed following the standard protocol during bolus administration of intravenous contrast. CONTRAST:  188mL OMNIPAQUE IOHEXOL 300 MG/ML  SOLN COMPARISON:  CT chest, abdomen and pelvis 11/05/2010 FINDINGS: CT CHEST FINDINGS Cardiovascular: Normal heart size. No pericardial effusion. Few coronary artery calcifications are present. Atherosclerotic plaque within the normal caliber aorta. Normal 3 vessel branching of the aortic arch. Slight tortuosity of the brachiocephalic vessels. No acute aortic or periaortic abnormality is seen. Central pulmonary arteries are normal caliber. No large central filling defects are identified on this non tailored examination of the pulmonary arteries. Mediastinum/Nodes: No mediastinal fluid or gas. Normal thyroid gland and thoracic inlet. No acute abnormality of the trachea or esophagus. No worrisome mediastinal, hilar  or axillary adenopathy. Lungs/Pleura: Some bandlike opacity in the mesial left lower lobe and minimally in the right lower lobe are most compatible with subsegmental atelectasis and/or scarring. No consolidation, features of edema, pneumothorax, or effusion. No suspicious pulmonary nodules or masses. Musculoskeletal: No acute osseous abnormality or suspicious osseous lesion. No chest wall mass or suspicious soft tissue lesions identified. Bilateral gynecomastia. CT ABDOMEN FINDINGS Hepatobiliary: No focal liver abnormality is seen. No gallstones, gallbladder wall thickening, or biliary dilatation. Pancreas: Small bilobed cystic lesion possibly arising from the distal body of the pancreas versus the lesser curvature. Unchanged since exams as remote as 07/15/2017. No pancreatic ductal dilatation or surrounding inflammatory changes. Spleen: Normal in size without focal  abnormality. Adrenals/Urinary Tract: Normal adrenal glands. Kidneys enhance and excrete symmetrically. Few subcentimeter hypoattenuating foci are too small to fully characterize on CT imaging but statistically likely benign. No obstructive urolithiasis or hydronephrosis. There is mild circumferential bladder wall thickening and perivesicular hazy stranding as well as periureteral stranding of the distal ureters bilaterally. Stomach/Bowel: Distal esophagus, stomach and duodenal sweep are unremarkable. No small bowel wall thickening or dilatation. No evidence of obstruction. A normal appendix is visualized. Proximal colon is unremarkable. Numerous distal colonic diverticula. No active diverticular inflammation. There is segmental thickening the proximal sigmoid colon a portion of the colon with numerous colonic diverticula which is similar to comparison exams and in the absence of adjacent pericolonic inflammation likely reflects chronic inflammatory change. Vascular/Lymphatic: Atherosclerotic plaque within the aorta and branch vessels. No aneurysm or ectasia. No acute periaortic abnormality. No suspicious or enlarged lymph nodes in the included lymphatic chains. Other: No free fluid. No free air. Small bilobed fluid collection measuring approximately 3 cm arising in the space between the pancreas and stomach, unclear origin. Detailed above in the pancreatic section. No other abnormal fluid collections in the abdomen or pelvis. No abdominopelvic free air or fluid. Few gas lucencies in the subcutaneous tissues of the left lower quadrant likely injectable related. Mild body wall edema. No bowel containing hernia. Small fat containing left inguinal hernia. Musculoskeletal: Multilevel degenerative changes are present in the imaged portions of the spine. New anterior wedging compression deformity of L2 with approximately 40% height loss anteriorly. No adjacent fluid or hemorrhage. No other acute osseous abnormality is  seen. Degenerative changes noted in the spine, hips and pelvis. IMPRESSION: 1. Mild circumferential bladder wall thickening and perivesicular hazy stranding as well as periureteral stranding of the distal ureters bilaterally. Findings are suspicious for cystitis and ascending urinary tract infection. Correlation with urinalysis is recommended. 2. Extensive distal colonic diverticulosis without evidence of acute diverticulitis. Segmental thickening of the proximal sigmoid colon may reflect chronic diverticular inflammation though correlation with outpatient colonoscopy is recommended. 3. New anterior wedging compression deformity of L2 with approximately 40% height loss anteriorly. No adjacent fluid or hemorrhage. 4. 3 cm bilobed fluid collection arising in the space between the pancreas and stomach, unclear origin but unchanged since January [id number] and almost certainly benign. Differential diagnosis includes small pseudocyst, exophytic side branch IPMN or possible gastric diverticulum among other etiologies. If felt to be clinically warranted could consider a nonemergent outpatient abdominal MRI with MRCP for further characterization. 5. Coronary artery calcifications. 6. Bilateral gynecomastia. 7. Small fat containing left inguinal hernia. 8. Mild body wall edema. Electronically Signed   By: Lovena Le M.D.   On: 09/05/2019 19:33   MR Lumbar Spine W Wo Contrast  Result Date: 09/07/2019 CLINICAL DATA:  Low back pain. Evaluate for compression fracture.  EXAM: MRI LUMBAR SPINE WITHOUT AND WITH CONTRAST TECHNIQUE: Multiplanar and multiecho pulse sequences of the lumbar spine were obtained without and with intravenous contrast. CONTRAST:  31mL GADAVIST GADOBUTROL 1 MMOL/ML IV SOLN COMPARISON:  None. FINDINGS: Segmentation:  Standard. Alignment: Grade 1 anterolisthesis of L4 on L5 secondary to facet disease. 2 mm retrolisthesis of L2 on L3. Vertebrae: No acute fracture. Chronic L2 vertebral body compression fracture  with approximately 10% height loss. No discitis or osteomyelitis. No aggressive osseous lesion. Conus medullaris and cauda equina: Conus extends to the T12 level. Conus and cauda equina appear normal. Paraspinal and other soft tissues: No acute paraspinal abnormality. Disc levels: Disc spaces: Degenerative disease with disc height loss at T11-12, T12-L1, L1-2, and L5-S1. T12-L1: No significant disc bulge. No evidence of neural foraminal stenosis. No central canal stenosis. L1-L2: Broad-based disc bulge. Mild bilateral facet arthropathy. Right subarticular recess narrowing. No evidence of neural foraminal stenosis. Mild spinal stenosis. L2-L3: Broad-based disc bulge. Moderate bilateral facet arthropathy. Moderate spinal stenosis. Bilateral subarticular recess stenosis. No foraminal stenosis. L3-L4: Broad-based disc bulge. Mild bilateral facet arthropathy. No evidence of neural foraminal stenosis. No central canal stenosis. L4-L5: Broad-based disc bulge. Severe bilateral facet arthropathy. Moderate spinal stenosis and bilateral subarticular recess stenosis. No evidence of neural foraminal stenosis. L5-S1: Broad-based disc bulge with a small central disc protrusion. Moderate bilateral facet arthropathy. Mild bilateral foraminal narrowing. No evidence of neural foraminal stenosis. No central canal stenosis. IMPRESSION: 1. Diffuse lumbar spine spondylosis as described above. 2. No acute osseous injury of the lumbar spine. Electronically Signed   By: Kathreen Devoid   On: 09/07/2019 17:05   CT ABDOMEN PELVIS W CONTRAST  Result Date: 09/05/2019 CLINICAL DATA:  Nausea, vomiting, elevated white blood cell count, fever and chills the EXAM: CT CHEST AND ABDOMEN WITH CONTRAST TECHNIQUE: Multidetector CT imaging of the chest and abdomen was performed following the standard protocol during bolus administration of intravenous contrast. CONTRAST:  128mL OMNIPAQUE IOHEXOL 300 MG/ML  SOLN COMPARISON:  CT chest, abdomen and pelvis  11/05/2010 FINDINGS: CT CHEST FINDINGS Cardiovascular: Normal heart size. No pericardial effusion. Few coronary artery calcifications are present. Atherosclerotic plaque within the normal caliber aorta. Normal 3 vessel branching of the aortic arch. Slight tortuosity of the brachiocephalic vessels. No acute aortic or periaortic abnormality is seen. Central pulmonary arteries are normal caliber. No large central filling defects are identified on this non tailored examination of the pulmonary arteries. Mediastinum/Nodes: No mediastinal fluid or gas. Normal thyroid gland and thoracic inlet. No acute abnormality of the trachea or esophagus. No worrisome mediastinal, hilar or axillary adenopathy. Lungs/Pleura: Some bandlike opacity in the mesial left lower lobe and minimally in the right lower lobe are most compatible with subsegmental atelectasis and/or scarring. No consolidation, features of edema, pneumothorax, or effusion. No suspicious pulmonary nodules or masses. Musculoskeletal: No acute osseous abnormality or suspicious osseous lesion. No chest wall mass or suspicious soft tissue lesions identified. Bilateral gynecomastia. CT ABDOMEN FINDINGS Hepatobiliary: No focal liver abnormality is seen. No gallstones, gallbladder wall thickening, or biliary dilatation. Pancreas: Small bilobed cystic lesion possibly arising from the distal body of the pancreas versus the lesser curvature. Unchanged since exams as remote as 07/15/2017. No pancreatic ductal dilatation or surrounding inflammatory changes. Spleen: Normal in size without focal abnormality. Adrenals/Urinary Tract: Normal adrenal glands. Kidneys enhance and excrete symmetrically. Few subcentimeter hypoattenuating foci are too small to fully characterize on CT imaging but statistically likely benign. No obstructive urolithiasis or hydronephrosis. There is mild circumferential bladder  wall thickening and perivesicular hazy stranding as well as periureteral stranding  of the distal ureters bilaterally. Stomach/Bowel: Distal esophagus, stomach and duodenal sweep are unremarkable. No small bowel wall thickening or dilatation. No evidence of obstruction. A normal appendix is visualized. Proximal colon is unremarkable. Numerous distal colonic diverticula. No active diverticular inflammation. There is segmental thickening the proximal sigmoid colon a portion of the colon with numerous colonic diverticula which is similar to comparison exams and in the absence of adjacent pericolonic inflammation likely reflects chronic inflammatory change. Vascular/Lymphatic: Atherosclerotic plaque within the aorta and branch vessels. No aneurysm or ectasia. No acute periaortic abnormality. No suspicious or enlarged lymph nodes in the included lymphatic chains. Other: No free fluid. No free air. Small bilobed fluid collection measuring approximately 3 cm arising in the space between the pancreas and stomach, unclear origin. Detailed above in the pancreatic section. No other abnormal fluid collections in the abdomen or pelvis. No abdominopelvic free air or fluid. Few gas lucencies in the subcutaneous tissues of the left lower quadrant likely injectable related. Mild body wall edema. No bowel containing hernia. Small fat containing left inguinal hernia. Musculoskeletal: Multilevel degenerative changes are present in the imaged portions of the spine. New anterior wedging compression deformity of L2 with approximately 40% height loss anteriorly. No adjacent fluid or hemorrhage. No other acute osseous abnormality is seen. Degenerative changes noted in the spine, hips and pelvis. IMPRESSION: 1. Mild circumferential bladder wall thickening and perivesicular hazy stranding as well as periureteral stranding of the distal ureters bilaterally. Findings are suspicious for cystitis and ascending urinary tract infection. Correlation with urinalysis is recommended. 2. Extensive distal colonic diverticulosis without  evidence of acute diverticulitis. Segmental thickening of the proximal sigmoid colon may reflect chronic diverticular inflammation though correlation with outpatient colonoscopy is recommended. 3. New anterior wedging compression deformity of L2 with approximately 40% height loss anteriorly. No adjacent fluid or hemorrhage. 4. 3 cm bilobed fluid collection arising in the space between the pancreas and stomach, unclear origin but unchanged since January [id number] and almost certainly benign. Differential diagnosis includes small pseudocyst, exophytic side branch IPMN or possible gastric diverticulum among other etiologies. If felt to be clinically warranted could consider a nonemergent outpatient abdominal MRI with MRCP for further characterization. 5. Coronary artery calcifications. 6. Bilateral gynecomastia. 7. Small fat containing left inguinal hernia. 8. Mild body wall edema. Electronically Signed   By: Lovena Le M.D.   On: 09/05/2019 19:33   DG CHEST PORT 1 VIEW  Result Date: 09/04/2019 CLINICAL DATA:  Nausea and vomiting and chills for 24 hours. EXAM: PORTABLE CHEST 1 VIEW COMPARISON:  Chest x-ray 11/05/2018 FINDINGS: The cardiopericardial silhouette is prominent but appears stable and most of this appears to be epicardial fat on a prior chest CT. Mild tortuosity of the thoracic aorta. The lungs are clear of an acute process. No pleural effusions or worrisome pulmonary lesions. The bony thorax is intact. IMPRESSION: No acute cardiopulmonary findings. Electronically Signed   By: Marijo Sanes M.D.   On: 09/04/2019 06:29   VAS [place] LOWER EXTREMITY VENOUS (DVT)  Result Date: 09/05/2019  Lower Venous DVTStudy Indications: Pain, and Swelling.  Limitations: Poor ultrasound/tissue interface. Comparison Study: No prior exam. Performing Technologist: Baldwin Crown ARDMS, RVT  Examination Guidelines: A complete evaluation includes B-mode imaging, spectral Doppler, color Doppler, and power Doppler as needed of all  accessible portions of each vessel. Bilateral testing is considered an integral part of a complete examination. Limited examinations for reoccurring indications may be  performed as noted. The reflux portion of the exam is performed with the patient in reverse Trendelenburg.  +---------+---------------+---------+-----------+----------+-----------------+ RIGHT    CompressibilityPhasicitySpontaneityPropertiesThrombus Aging    +---------+---------------+---------+-----------+----------+-----------------+ CFV      Full           Yes      Yes                                    +---------+---------------+---------+-----------+----------+-----------------+ SFJ      Full                                                           +---------+---------------+---------+-----------+----------+-----------------+ FV Prox  Full                                                           +---------+---------------+---------+-----------+----------+-----------------+ FV Mid   Full                                                           +---------+---------------+---------+-----------+----------+-----------------+ FV DistalFull                                                           +---------+---------------+---------+-----------+----------+-----------------+ PFV      Full                                                           +---------+---------------+---------+-----------+----------+-----------------+ POP      Full           Yes      Yes                                    +---------+---------------+---------+-----------+----------+-----------------+ PTV      Full                                         poorly visualized +---------+---------------+---------+-----------+----------+-----------------+ PERO     Full                                         poorly visualized +---------+---------------+---------+-----------+----------+-----------------+    +---------+---------------+---------+-----------+----------+-----------------+ LEFT     CompressibilityPhasicitySpontaneityPropertiesThrombus Aging    +---------+---------------+---------+-----------+----------+-----------------+ CFV      Full  Yes      Yes                                    +---------+---------------+---------+-----------+----------+-----------------+ SFJ      Full                                                           +---------+---------------+---------+-----------+----------+-----------------+ FV Prox  Full                                                           +---------+---------------+---------+-----------+----------+-----------------+ FV Mid   Full                                                           +---------+---------------+---------+-----------+----------+-----------------+ FV DistalFull                                         poorly visualized +---------+---------------+---------+-----------+----------+-----------------+ PFV      Full                                                           +---------+---------------+---------+-----------+----------+-----------------+ POP      Full           Yes      Yes                                    +---------+---------------+---------+-----------+----------+-----------------+ PTV      Full                                         poorly visualized +---------+---------------+---------+-----------+----------+-----------------+ PERO     Full                                         poorly visualized +---------+---------------+---------+-----------+----------+-----------------+     Summary: BILATERAL: - No evidence of deep vein thrombosis seen in the lower extremities, bilaterally.  RIGHT: - Portions of this examination were limited- see technologist comments above. - No cystic structure found in the popliteal fossa.  LEFT: - Portions of this examination were  limited- see technologist comments above. - No cystic structure found in the popliteal fossa.  *See table(s) above for measurements and observations. Electronically signed by Harold Barban MD on 09/05/2019 at 7:07:14 PM.    Final     Microbiology: Recent Results (  from the past 240 hour(s))  SARS CORONAVIRUS 2 (TAT 6-24 HRS) Nasopharyngeal Nasopharyngeal Swab     Status: None   Collection Time: 09/03/19 10:07 PM   Specimen: Nasopharyngeal Swab  Result Value Ref Range Status   SARS Coronavirus 2 NEGATIVE NEGATIVE Final    Comment: (NOTE) SARS-CoV-2 target nucleic acids are NOT DETECTED. The SARS-CoV-2 RNA is generally detectable in upper and lower respiratory specimens during the acute phase of infection. Negative results do not preclude SARS-CoV-2 infection, do not rule out co-infections with other pathogens, and should not be used as the sole basis for treatment or other patient management decisions. Negative results must be combined with clinical observations, patient history, and epidemiological information. The expected result is Negative. Fact Sheet for Patients: SugarRoll.be Fact Sheet for Healthcare Providers: https://www.woods-mathews.com/ This test is not yet approved or cleared by the [place] FDA and  has been authorized for detection and/or diagnosis of SARS-CoV-2 by FDA under an Emergency Use Authorization (EUA). This EUA will remain  in effect (meaning this test can be used) for the duration of the COVID-19 declaration under Section 56 4(b)(1) of the Act, 21 U.S.C. section 360bbb-3(b)(1), unless the authorization is terminated or revoked sooner. Performed at [facility], [place] [address] [place], [place] [address]   Culture, blood (routine x 2)     Status: None (Preliminary result)   Collection Time: 09/04/19  6:23 AM   Specimen: BLOOD  Result Value Ref Range Status   Specimen Description   Final    BLOOD LEFT  ANTECUBITAL Performed at [facility] [address] [place], [place] [address]    Special Requests   Final    BOTTLES DRAWN AEROBIC AND ANAEROBIC Blood Culture adequate volume Performed at [facility] [address] [place], [place] [address]    Culture   Final    NO GROWTH 4 DAYS Performed at [facility], [place] [address] [place], [place] [address]    Report Status PENDING  Incomplete  Culture, Urine     Status: None   Collection Time: 09/06/19  7:49 AM   Specimen: Urine, Random  Result Value Ref Range Status   Specimen Description   Final    URINE, RANDOM Performed at [facility] [address], [place], [place] [address]    Special Requests   Final    Immunocompromised Performed at [facility], [place] [address], [place], [place] [address]    Culture   Final    NO GROWTH Performed at [facility], [place] [address], [place], [place] [address]    Report Status 09/07/2019 FINAL  Final     Labs: Basic Metabolic Panel: Recent Labs  Lab 09/03/19 1950 09/04/19 0528 09/05/19 0409 09/06/19 0644 09/07/19 0820  NA 141 137 138 138 137  K 4.7 4.7 4.1 4.1 3.8  CL 106 107 109 109 107  CO2 22 21* 22 24 23   GLUCOSE 145* 134* 115* 105* 87  BUN 20 26* 22 15 12   CREATININE 1.54* 1.42* 1.06 1.06 1.00  CALCIUM 9.5 7.8* 8.2* 8.1* 8.2*  MG  --   --   --   --  1.9   Liver Function Tests: Recent Labs  Lab 09/03/19 1950 09/06/19 0644 09/07/19 0820  AST 30 15 17   ALT 22 16 16   ALKPHOS 114 67 71  BILITOT 0.8 0.7 0.5  PROT 7.9 5.4* 5.7*  ALBUMIN 4.2 2.8* 3.0*  Recent Labs  Lab 09/03/19 1950  LIPASE 22   No results for input(s): AMMONIA in the last 168 hours. CBC: Recent Labs  Lab 09/03/19 1950 09/04/19 0528 09/05/19 0409 09/06/19 0644 09/07/19 0820  WBC 17.8* 13.1* 9.2 7.3 6.8  NEUTROABS  --   --  6.0 3.6 2.7  HGB 16.0 11.9* 11.4* 11.4* 11.9*  HCT 50.4 36.2* 35.6* 36.0*  37.3*  MCV 91.5 91.4 92.5 93.5 92.6  PLT 428* 301 297 295 339   Cardiac Enzymes: No results for input(s): CKTOTAL, CKMB, CKMBINDEX, TROPONINI in the last 168 hours. BNP: BNP (last 3 results) No results for input(s): BNP in the last 8760 hours.  ProBNP (last 3 results) No results for input(s): PROBNP in the last 8760 hours.  CBG: Recent Labs  Lab 09/07/19 1308 09/07/19 1653 09/07/19 2038 09/08/19 0741 09/08/19 1303  GLUCAP 83 108* 114* 83 128*       Signed:  Kayleen Memos, MD [facility] 09/08/2019, 1:36 PM

## 2019-09-08 NOTE — Discharge Instructions (Signed)
Nausea and Vomiting, Adult °Nausea is feeling sick to your stomach or feeling that you are about to throw up (vomit). Vomiting is when food in your stomach is thrown up and out of the mouth. Throwing up can make you feel weak. It can also make you lose too much water in your body (get dehydrated). If you lose too much water in your body, you may: °· Feel tired. °· Feel thirsty. °· Have a dry mouth. °· Have cracked lips. °· Go pee (urinate) less often. °Older adults and people with other diseases or a weak body defense system (immune system) are at higher risk for losing too much water in the body. If you feel sick to your stomach and you throw up, it is important to follow instructions from your doctor about how to take care of yourself. °Follow these instructions at home: °Watch your symptoms for any changes. Tell your doctor about them. Follow these instructions to care for yourself at home. °Eating and drinking ° °  ° °· Take an ORS (oral rehydration solution). This is a drink that is sold at pharmacies and stores. °· Drink clear fluids in small amounts as you are able, such as: °? Water. °? Ice chips. °? Fruit juice that has water added (diluted fruit juice). °? Low-calorie sports drinks. °· Eat bland, easy-to-digest foods in small amounts as you are able, such as: °? Bananas. °? Applesauce. °? Rice. °? Low-fat (lean) meats. °? Toast. °? Crackers. °· Avoid drinking fluids that have a lot of sugar or caffeine in them. This includes energy drinks, sports drinks, and soda. °· Avoid alcohol. °· Avoid spicy or fatty foods. °General instructions °· Take over-the-counter and prescription medicines only as told by your doctor. °· Drink enough fluid to keep your pee (urine) pale yellow. °· Wash your hands often with soap and water. If you cannot use soap and water, use hand sanitizer. °· Make sure that all people in your home wash their hands well and often. °· Rest at home while you get better. °· Watch your condition  for any changes. °· Take slow and deep breaths when you feel sick to your stomach. °· Keep all follow-up visits as told by your doctor. This is important. °Contact a doctor if: °· Your symptoms get worse. °· You have new symptoms. °· You have a fever. °· You cannot drink fluids without throwing up. °· You feel sick to your stomach for more than 2 days. °· You feel light-headed or dizzy. °· You have a headache. °· You have muscle cramps. °· You have a rash. °· You have pain while peeing. °Get help right away if: °· You have pain in your chest, neck, arm, or jaw. °· You feel very weak or you pass out (faint). °· You throw up again and again. °· You have throw up that is bright red or looks like black coffee grounds. °· You have bloody or black poop (stools) or poop that looks like tar. °· You have a very bad headache, a stiff neck, or both. °· You have very bad pain, cramping, or bloating in your belly (abdomen). °· You have trouble breathing. °· You are breathing very quickly. °· Your heart is beating very quickly. °· Your skin feels cold and clammy. °· You feel confused. °· You have signs of losing too much water in your body, such as: °? Dark pee, very little pee, or no pee. °? Cracked lips. °? Dry mouth. °? Sunken eyes. °?   Sleepiness. ? Weakness. These symptoms may be an emergency. Do not wait to see if the symptoms will go away. Get medical help right away. Call your local emergency services (911 in the [place] Do not drive yourself to the hospital. Summary  Nausea is feeling sick to your stomach or feeling that you are about to throw up (vomit). Vomiting is when food in your stomach is thrown up and out of the mouth.  Follow instructions from your doctor about eating and drinking to keep from losing too much water in your body.  Take over-the-counter and prescription medicines only as told by your doctor.  Contact your doctor if your symptoms get worse or you have new symptoms.  Keep all follow-up  visits as told by your doctor. This is important. This information is not intended to replace advice given to you by your health care provider. Make sure you discuss any questions you have with your health care provider. Document Revised: 10/03/2018 Document Reviewed: 11/19/2017 Elsevier Patient Education  [place].  Addison's Disease  Addison's disease, which is also called primary adrenal insufficiency, is a condition in which the adrenal glands do not make enough of the hormones cortisol and aldosterone. The disease causes blood pressure to drop and causes potassium to build up to dangerous levels. If Addison's disease is not treated, it can suddenly get worse and become life-threatening. This is called an adrenal crisis (Addisonian crisis). What are the causes? This condition may be caused by:  A disease in which the body's immune system damages the adrenal glands.  An infection of the adrenal glands.  Bleeding (hemorrhage) in the adrenal glands.  A tumor. What are the signs or symptoms? Common symptoms of this condition include:  Severe fatigue.  Muscle weakness.  Loss of appetite.  Weight loss.  Darkening of the skin.  Low blood pressure (hypotension). Other symptoms include:  Nausea or vomiting.  Diarrhea.  Dizziness or fainting.  Irritability  Depression.  Salt cravings.  Low blood sugar (hypoglycemia).  Irregular or no menstrual periods. Symptoms usually develop slowly and get worse gradually. How is this diagnosed? This condition may be diagnosed based on your:  Medical history.  Symptoms.  Lab test results. Lab tests include a measurement of your blood cortisol levels.  Imaging tests results. You may have a CT scan of the adrenal glands. How is this treated? This condition cannot be cured, but it can be managed with medicines that replace cortisol and aldosterone. You may need to take these medicines:  Several times a day, by  mouth.  Through an injection if you become so sick that you are unable to take these medicines by mouth or you are unable to keep them down. Illness, stress, and surgery can increase your body's need for cortisol. It is very important that you talk with your health care provider and understand how to adjust your medicine dosages if you become ill, stressed, or if you are going to have surgery. Follow these instructions at home:  Keep all follow-up visits as told by your health care provider. This is important. Medicines  Know how to increase your medicine dosage during periods of stress, mild illness, or surgery.  Take over-the-counter and prescription medicines only as told by your health care provider. General instructions   When you travel, carry a needle, a syringe, and an injectable form of cortisol in case of an emergency.  In case of an emergency, wear a medical alert bracelet or neck chain  so that others understand that you have Addison's disease.  Carry an ID card that states that you have Addison's disease. The card should include: ? Instructions to inject a certain amount of medicine if you are severely hurt or cannot respond. ? The name and phone number of your health care provider. ? The name and phone number of your closest relative. Contact a health care provider if:  You get sick with another illness.  You develop new symptoms. Get help right away if:  You have a severe infection or other illness.  You have severe vomiting or diarrhea.  You find it necessary to give yourself injectable medicine.  You have symptoms of an Addisoniancrisis. These symptoms include: ? Sudden, severe pain in the lower back, abdomen, or legs. ? Severe vomiting and diarrhea. ? Dehydration. ? Low blood pressure. ? Loss of consciousness. Summary  Addison's disease is the inability of the adrenal glands to make hormones that regulate everyday functions of the body.  If untreated,  this condition can lead to life-threatening problems.  It is very important that you talk with your health care provider and understand how to adjust your medicine dosages if you become ill, stressed, or if you are going to have surgery.  Take over-the-counter and prescription medicines only as told by your health care provider.  Keep all follow-up visits as told by your health care provider. This is important. This information is not intended to replace advice given to you by your health care provider. Make sure you discuss any questions you have with your health care provider. Document Revised: 03/06/2018 Document Reviewed: 03/06/2018 Elsevier Patient Education  2020 [employer].

## 2019-09-08 NOTE — ED Notes (Signed)
Patient assisted to bathroom in wheelchair 

## 2019-09-08 NOTE — Progress Notes (Signed)
Subjective: No new complaints   Antibiotics:  Anti-infectives (From admission, onward)   Start     Dose/Rate Route Frequency Ordered Stop   09/06/19 0645  cefTRIAXone (ROCEPHIN) 1 g in sodium chloride 0.9 % 100 mL IVPB  Status:  Discontinued     1 g 200 mL/hr over 30 Minutes Intravenous Daily before breakfast 09/06/19 0628 09/07/19 1224   09/04/19 1000  acyclovir (ZOVIRAX) tablet 400 mg     400 mg Oral 2 times daily 09/04/19 0042        Medications: Scheduled Meds: . acyclovir  400 mg Oral BID  . aspirin EC  81 mg Oral QHS  . budesonide (PULMICORT) nebulizer solution  0.25 mg Nebulization BID  . cholecalciferol  2,000 Units Oral QHS  . enoxaparin (LOVENOX) injection  40 mg Subcutaneous Q24H  . guaiFENesin  600 mg Oral BID  . hydrocortisone  20 mg Oral BID  . loratadine  10 mg Oral Daily  . magnesium oxide  400 mg Oral Daily  . metoprolol tartrate  25 mg Oral BID  . montelukast  10 mg Oral Daily  . pantoprazole  40 mg Oral Daily  . rosuvastatin  10 mg Oral QHS  . senna-docusate  2 tablet Oral QHS   Continuous Infusions: PRN Meds:.acetaminophen **OR** acetaminophen, albuterol, ondansetron **OR** ondansetron (ZOFRAN) IV, polyvinyl alcohol, sodium chloride    Objective: Weight change:  No intake or output data in the 24 hours ending 09/08/19 1119 Blood pressure 124/86, pulse 72, temperature 98.1 F (36.7 C), resp. rate (!) 21, height 5\' 10"  (1.778 m), weight 105.8 kg, SpO2 96 %. Temp:  [98 F (36.7 C)-98.1 F (36.7 C)] 98.1 F (36.7 C) (03/16 0430) Pulse Rate:  [62-73] 72 (03/16 1110) Resp:  [16-21] 21 (03/16 0430) BP: (121-133)/(73-90) 124/86 (03/16 1110) SpO2:  [96 %-98 %] 96 % (03/16 0919)  Physical Exam: General: Alert and awake, oriented x3, not in any acute distress. HEENT: anicteric sclera, EOMI CVS regular rate, normal  Chest: , no wheezing, no respiratory distress Abdomen: soft non-distended,  Extremities: no edema or deformity noted  bilaterally Skin: no rashes Neuro: nonfocal  CBC:    BMET Recent Labs    09/06/19 0644 09/07/19 0820  NA 138 137  K 4.1 3.8  CL 109 107  CO2 24 23  GLUCOSE 105* 87  BUN 15 12  CREATININE 1.06 1.00  CALCIUM 8.1* 8.2*     Liver Panel  Recent Labs    09/06/19 0644 09/07/19 0820  PROT 5.4* 5.7*  ALBUMIN 2.8* 3.0*  AST 15 17  ALT 16 16  ALKPHOS 67 71  BILITOT 0.7 0.5       Sedimentation Rate No results for input(s): ESRSEDRATE in the last 72 hours. C-Reactive Protein No results for input(s): CRP in the last 72 hours.  Micro Results: Recent Results (from the past 720 hour(s))  SARS CORONAVIRUS 2 (TAT 6-24 HRS) Nasopharyngeal Nasopharyngeal Swab     Status: None   Collection Time: 09/03/19 10:07 PM   Specimen: Nasopharyngeal Swab  Result Value Ref Range Status   SARS Coronavirus 2 NEGATIVE NEGATIVE Final    Comment: (NOTE) SARS-CoV-2 target nucleic acids are NOT DETECTED. The SARS-CoV-2 RNA is generally detectable in upper and lower respiratory specimens during the acute phase of infection. Negative results do not preclude SARS-CoV-2 infection, do not rule out co-infections with other pathogens, and should not be used as the sole basis for treatment or other patient  management decisions. Negative results must be combined with clinical observations, patient history, and epidemiological information. The expected result is Negative. Fact Sheet for Patients: SugarRoll.be Fact Sheet for Healthcare Providers: https://www.woods-mathews.com/ This test is not yet approved or cleared by the [place] FDA and  has been authorized for detection and/or diagnosis of SARS-CoV-2 by FDA under an Emergency Use Authorization (EUA). This EUA will remain  in effect (meaning this test can be used) for the duration of the COVID-19 declaration under Section 56 4(b)(1) of the Act, 21 U.S.C. section 360bbb-3(b)(1), unless the  authorization is terminated or revoked sooner. Performed at [facility], [place] [address] [place], [place] [address]   Culture, blood (routine x 2)     Status: None (Preliminary result)   Collection Time: 09/04/19  6:23 AM   Specimen: BLOOD  Result Value Ref Range Status   Specimen Description   Final    BLOOD LEFT ANTECUBITAL Performed at [facility] [address] [place], [place] [address]    Special Requests   Final    BOTTLES DRAWN AEROBIC AND ANAEROBIC Blood Culture adequate volume Performed at [facility] [address], [place], [place] [address]    Culture   Final    NO GROWTH 4 DAYS Performed at [facility], [place] [address] [place], [place] [address]    Report Status PENDING  Incomplete  Culture, Urine     Status: None   Collection Time: 09/06/19  7:49 AM   Specimen: Urine, Random  Result Value Ref Range Status   Specimen Description   Final    URINE, RANDOM Performed at [facility] [address] [place], [place] [address]    Special Requests   Final    Immunocompromised Performed at [facility], [place] [address], [place], [place] [address]    Culture   Final    NO GROWTH Performed at [facility], [place] [address], [place], [place] [address]    Report Status 09/07/2019 FINAL  Final    Studies/Results: MR Lumbar Spine W Wo Contrast  Result Date: 09/07/2019 CLINICAL DATA:  Low back pain. Evaluate for compression fracture. EXAM: MRI LUMBAR SPINE WITHOUT AND WITH CONTRAST TECHNIQUE: Multiplanar and multiecho pulse sequences of the lumbar spine were obtained without and with intravenous contrast. CONTRAST:  42mL GADAVIST GADOBUTROL 1 MMOL/ML IV SOLN COMPARISON:  None. FINDINGS: Segmentation:  Standard. Alignment: Grade 1 anterolisthesis of L4 on L5 secondary to facet disease. 2 mm retrolisthesis of L2 on L3. Vertebrae: No acute fracture. Chronic L2 vertebral body  compression fracture with approximately 10% height loss. No discitis or osteomyelitis. No aggressive osseous lesion. Conus medullaris and cauda equina: Conus extends to the T12 level. Conus and cauda equina appear normal. Paraspinal and other soft tissues: No acute paraspinal abnormality. Disc levels: Disc spaces: Degenerative disease with disc height loss at T11-12, T12-L1, L1-2, and L5-S1. T12-L1: No significant disc bulge. No evidence of neural foraminal stenosis. No central canal stenosis. L1-L2: Broad-based disc bulge. Mild bilateral facet arthropathy. Right subarticular recess narrowing. No evidence of neural foraminal stenosis. Mild spinal stenosis. L2-L3: Broad-based disc bulge. Moderate bilateral facet arthropathy. Moderate spinal stenosis. Bilateral subarticular recess stenosis. No foraminal stenosis. L3-L4: Broad-based disc bulge. Mild bilateral facet arthropathy. No evidence of neural foraminal stenosis. No central canal stenosis. L4-L5: Broad-based disc bulge. Severe bilateral facet arthropathy. Moderate spinal stenosis and bilateral subarticular recess stenosis. No evidence of neural foraminal stenosis. L5-S1:  Broad-based disc bulge with a small central disc protrusion. Moderate bilateral facet arthropathy. Mild bilateral foraminal narrowing. No evidence of neural foraminal stenosis. No central canal stenosis. IMPRESSION: 1. Diffuse lumbar spine spondylosis as described above. 2. No acute osseous injury of the lumbar spine. Electronically Signed   By: Kathreen Devoid   On: 09/07/2019 17:05      Assessment/Plan:  INTERVAL HISTORY: MRI of spine without infection   Principal Problem:   Nausea & vomiting Active Problems:   Essential hypertension   History of stem cell transplant ([place])   Adrenocortical insufficiency (H[facility])   History of CVA (cerebrovascular accident)   Adrenal crisis syndrome ([place])    FRAK PAOLINI is a 69 y.o. male with HPI: TREMON HOLLAN is a 69 y.o. male who has a  history of myelodysplastic syndrome status post bone marrow transplant at [facility] in 2017.  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   #1 Adrenal crisis secondary to steroid tapering. NO ID cause identified and no need for abx. I sent some fungal ag but doubt these will be + either  #2 Compression fracture: I suspect due to osteoporosis from steroids. May benefit from bisphosphonate, Ca defer to primary  I will sign off please call with further questions.    LOS: 4 days   Alcide Evener 09/08/2019, 11:19 AM

## 2019-09-08 NOTE — ED Triage Notes (Signed)
Patient complaining of vomiting. Patient states he can not keep anything down. Patient states he just got out of the hospital for the same thing.

## 2019-09-08 NOTE — ED Provider Notes (Signed)
[facility] DEPT Provider Note   CSN: [id number] Arrival date & time: 09/08/19  1858     History Chief Complaint  Patient presents with  . Emesis    ADITH GLENDENNING is a 69 y.o. male.  69 year old male who has history of adrenal insufficiency along with mild dysplastic syndrome who is status post bone marrow transplant presents with emesis since his evening.  Patient was just discharged from the hospital today after a stay for adrenal crisis.  States that his physician has attempted to wean his steroids but that it is triggered an adrenal crisis.  Review of the most recent admission shows extensive work-up including abdominal CT as well as ID consult.  No other triggers felt to be causing his a crisis were found.  He denies any fever or chills.  No abdominal discomfort.  He spoke with his transplant doctor at [facility] and was told to take Zofran along with a dose of hydrocortisone but threw that up and called EMS and presents here.        Past Medical History:  Diagnosis Date  . Adrenocortical insufficiency ([place]) 08/06/2017   Patient with 2 hospitalizations in January 2019- treated with fluids, antibiotics, steroids- ultimately thought most likely due to adrenal insufficiency with no obvious source of infection found  . Allergy    seasonal/animals  . Chronic prostatitis 12/17/2008  . Diverticulosis 01/16/2011   History of diverticulitis   . ERECTILE DYSFUNCTION 02/07/2007  . HYPERLIPIDEMIA 02/04/2007  . HYPERTENSION 02/04/2007  . NEOPLASM, MALIGNANT, SKIN, BACK 02/09/2009  . Pneumonia, viral 12/2017  . S/P bone marrow transplant [facility])     Patient Active Problem List   Diagnosis Date Noted  . Adrenal crisis syndrome ([place]) 09/04/2019  . Nausea & vomiting 09/03/2019  . History of CVA (cerebrovascular accident) 03/10/2019  . Adrenal crisis ([place]) 11/05/2018  . History of DVT (deep vein thrombosis) 09/25/2018  . Adrenocortical insufficiency ([place]) 08/06/2017   . Post chemotherapy Dry eyes due to decreased tear production 08/06/2017  . post chemotherapy Peripheral neuropathy 08/06/2017  . History of stem cell transplant ([place]) 03/04/2017  . GVHD (graft versus host disease) ([place]) 03/04/2017  . History of myelodysplastic syndrome 12/06/2015  . BPH associated with nocturia 11/15/2014  . Leukopenia 11/15/2014  . Steroid-induced hyperglycemia 08/16/2014  . Chest pain 08/13/2014  . GERD (gastroesophageal reflux disease) 05/03/2014  . Cervical disc disorder with radiculopathy of cervical region 07/14/2010  . MIXED HEARING LOSS BILATERAL 07/14/2010  . ERECTILE DYSFUNCTION 02/07/2007  . Hyperlipidemia 02/04/2007  . Essential hypertension 02/04/2007    Past Surgical History:  Procedure Laterality Date  . CATARACT EXTRACTION Left 06/2018  . none         Family History  Problem Relation Age of Onset  . Breast cancer Mother   . CVA Mother        quit taking HTN meds  . Alzheimer's disease Father        late 34s. mid 29s  . Asperger's syndrome Son     Social History   Tobacco Use  . Smoking status: Never Smoker  . Smokeless tobacco: Never Used  Substance Use Topics  . Alcohol use: Yes    Alcohol/week: 3.0 standard drinks    Types: 3 Standard drinks or equivalent per week  . Drug use: No    Home Medications Prior to Admission medications   Medication Sig Start Date End Date Taking? Authorizing Provider  acyclovir (ZOVIRAX) 400 MG tablet Take 400 mg by  mouth 2 (two) times daily.    [provider]  albuterol (PROVENTIL HFA;VENTOLIN HFA) 108 (90 Base) MCG/ACT inhaler Inhale 1-2 puffs into the lungs every 6 (six) hours as needed for wheezing or shortness of breath.     [provider]  aspirin EC 325 MG tablet Take 81 mg by mouth at bedtime.     [provider]  Cholecalciferol (VITAMIN D) 50 MCG (2000 UT) CAPS Take 2,000 Units by mouth at bedtime.     [provider]  diphenhydrAMINE HCl (BENADRYL  ALLERGY PO) Take by mouth at bedtime as needed.    [provider]  famotidine (PEPCID) 20 MG tablet Take 20 mg by mouth daily.    [provider]  fluticasone (FLONASE) 50 MCG/ACT nasal spray Place 2 sprays into both nostrils daily as needed for allergies or rhinitis.    [provider]  fluticasone (FLOVENT HFA) 220 MCG/ACT inhaler Inhale 2 puffs into the lungs 2 (two) times daily.     [provider]  guaiFENesin (MUCINEX PO) Take by mouth.    [provider]  hydrocortisone (CORTEF) 20 MG tablet Take 1 tablet (20 mg total) by mouth 2 (two) times daily. 09/08/19 10/08/19  Kayleen Memos, DO  hydrocortisone cream 1 % Apply 1 application topically as needed for itching.    [provider]  Lancets (ONETOUCH DELICA PLUS 123XX123) [place]  07/14/18   [provider]  loratadine (CLARITIN) 10 MG tablet Take 10 mg by mouth daily.    [provider]  LORazepam (ATIVAN) 0.5 MG tablet Take 0.5 mg by mouth every 4 (four) hours as needed for anxiety.    [provider]  Magnesium Oxide 400 MG CAPS Take 1 capsule (400 mg total) by mouth 5 (five) times daily. Patient taking differently: Take 400 mg by mouth daily.  09/03/16   Nicholas Lose, MD  Melatonin 3 MG TBDP Take by mouth at bedtime as needed.    [provider]  metFORMIN (GLUCOPHAGE) 500 MG tablet Take 0.5 tablets (250 mg total) by mouth 2 (two) times daily with a meal. Patient taking differently: Take 500 mg by mouth 2 (two) times daily with a meal.  07/27/19   [name], MD  metoprolol tartrate (LOPRESSOR) 25 MG tablet Take 1 tablet by mouth 2 (two) times daily. 01/20/19   [provider]  montelukast (SINGULAIR) 10 MG tablet Take 10 mg by mouth daily.     [provider]  Multiple Vitamin (MULTI-VITAMIN) tablet Take by mouth.    [provider]  Omeprazole 20 MG TBEC Take 20 mg by mouth daily.  08/25/18   [provider]    ondansetron (ZOFRAN-ODT) 8 MG disintegrating tablet Take 8 mg by mouth every 8 (eight) hours as needed for nausea or vomiting.    [provider]  oxycodone (OXY-IR) 5 MG capsule Take 5 mg by mouth every 6 (six) hours as needed for pain.    [provider]  Polyethylene Glycol 3350 (MIRALAX PO) Take by mouth.    [provider]  polyvinyl alcohol (LIQUIFILM TEARS) 1.4 % ophthalmic solution Place 1 drop into both eyes as needed for dry eyes.    [provider]  rosuvastatin (CRESTOR) 10 MG tablet Take 10 mg by mouth at bedtime.  03/03/19   [provider]  senna-docusate (SENOKOT-S) 8.6-50 MG tablet Take 2 tablets by mouth at bedtime.     [provider]    Allergies  Tape  Review of Systems   Review of Systems  All other systems reviewed and are negative.   Physical Exam Updated Vital Signs BP (!) 116/53   Pulse (!) 107   Temp (!) 97.5 F (36.4 C) (Oral)   Resp 20   Ht 1.778 m (5\' 10" )   Wt 105.8 kg   SpO2 92%   BMI 33.47 kg/m   Physical Exam Vitals and nursing note reviewed.  Constitutional:      General: He is not in acute distress.    Appearance: Normal appearance. He is well-developed. He is not toxic-appearing.  HENT:     Head: Normocephalic and atraumatic.  Eyes:     General: Lids are normal.     Conjunctiva/sclera: Conjunctivae normal.     Pupils: Pupils are equal, round, and reactive to light.  Neck:     Thyroid: No thyroid mass.     Trachea: No tracheal deviation.  Cardiovascular:     Rate and Rhythm: Regular rhythm. Tachycardia present.     Heart sounds: Normal heart sounds. No murmur. No gallop.   Pulmonary:     Effort: Pulmonary effort is normal. No respiratory distress.     Breath sounds: Normal breath sounds. No stridor. No decreased breath sounds, wheezing, rhonchi or rales.  Abdominal:     General: Bowel sounds are normal. There is no distension.     Palpations: Abdomen is soft.     Tenderness:  There is no abdominal tenderness. There is no guarding or rebound.  Musculoskeletal:        General: No tenderness. Normal range of motion.     Cervical back: Normal range of motion and neck supple.  Skin:    General: Skin is warm and dry.     Findings: No abrasion or rash.  Neurological:     Mental Status: He is alert and oriented to person, place, and time.     GCS: GCS eye subscore is 4. GCS verbal subscore is 5. GCS motor subscore is 6.     Cranial Nerves: No cranial nerve deficit.     Sensory: No sensory deficit.  Psychiatric:        Speech: Speech normal.        Behavior: Behavior normal.     ED Results / Procedures / Treatments   Labs (all labs ordered are listed, but only abnormal results are displayed) Labs Reviewed  COMPREHENSIVE METABOLIC PANEL - Abnormal; Notable for the following components:      Result Value   CO2 21 (*)    Glucose, Bld 144 (*)    Creatinine, Ser 1.36 (*)    Alkaline Phosphatase 141 (*)    GFR calc non Af Amer 52 (*)    All other components within normal limits  CBC - Abnormal; Notable for the following components:   WBC 21.9 (*)    Platelets 418 (*)    All other components within normal limits  CBG MONITORING, ED - Abnormal; Notable for the following components:   Glucose-Capillary 115 (*)    All other components within normal limits  LIPASE, BLOOD  URINALYSIS, ROUTINE W REFLEX MICROSCOPIC    EKG None  Radiology MR Lumbar Spine W Wo Contrast  Result Date: 09/07/2019 CLINICAL DATA:  Low back pain. Evaluate for compression fracture. EXAM: MRI LUMBAR SPINE WITHOUT AND WITH CONTRAST TECHNIQUE: Multiplanar and multiecho pulse sequences of the lumbar spine were obtained without and with intravenous contrast. CONTRAST:  45mL GADAVIST GADOBUTROL 1 MMOL/ML IV  SOLN COMPARISON:  None. FINDINGS: Segmentation:  Standard. Alignment: Grade 1 anterolisthesis of L4 on L5 secondary to facet disease. 2 mm retrolisthesis of L2 on L3. Vertebrae: No acute  fracture. Chronic L2 vertebral body compression fracture with approximately 10% height loss. No discitis or osteomyelitis. No aggressive osseous lesion. Conus medullaris and cauda equina: Conus extends to the T12 level. Conus and cauda equina appear normal. Paraspinal and other soft tissues: No acute paraspinal abnormality. Disc levels: Disc spaces: Degenerative disease with disc height loss at T11-12, T12-L1, L1-2, and L5-S1. T12-L1: No significant disc bulge. No evidence of neural foraminal stenosis. No central canal stenosis. L1-L2: Broad-based disc bulge. Mild bilateral facet arthropathy. Right subarticular recess narrowing. No evidence of neural foraminal stenosis. Mild spinal stenosis. L2-L3: Broad-based disc bulge. Moderate bilateral facet arthropathy. Moderate spinal stenosis. Bilateral subarticular recess stenosis. No foraminal stenosis. L3-L4: Broad-based disc bulge. Mild bilateral facet arthropathy. No evidence of neural foraminal stenosis. No central canal stenosis. L4-L5: Broad-based disc bulge. Severe bilateral facet arthropathy. Moderate spinal stenosis and bilateral subarticular recess stenosis. No evidence of neural foraminal stenosis. L5-S1: Broad-based disc bulge with a small central disc protrusion. Moderate bilateral facet arthropathy. Mild bilateral foraminal narrowing. No evidence of neural foraminal stenosis. No central canal stenosis. IMPRESSION: 1. Diffuse lumbar spine spondylosis as described above. 2. No acute osseous injury of the lumbar spine. Electronically Signed   By: Kathreen Devoid   On: 09/07/2019 17:05    Procedures Procedures (including critical care time)  Medications Ordered in ED Medications  sodium chloride flush (NS) 0.9 % injection 3 mL (has no administration in time range)  ondansetron (ZOFRAN-ODT) disintegrating tablet 4 mg (4 mg Oral Given 09/08/19 1917)    ED Course  I have reviewed the triage vital signs and the nursing notes.  Pertinent labs & imaging  results that were available during my care of the patient were reviewed by me and considered in my medical decision making (see chart for details).    MDM Rules/Calculators/A&P                      Patient has leukocytosis noted on his CBC but this is likely from his steroids.  Does have a slight bump in his creatinine likely from dehydration.  He has not had any diarrhea.  His emesis has been nonbilious or bloody.  Will IV hydrate here, give hydrocortisone, admit to medicine service Final Clinical Impression(s) / ED Diagnoses Final diagnoses:  None    Rx / DC Orders ED Discharge Orders    None       Lacretia Leigh, MD 09/08/19 2153

## 2019-09-08 NOTE — ED Notes (Signed)
PT upset state no one is doing anything. When he was here last week he was seen right away and place in a room. Writer info PT there are currently no rooms at this time. PT have been seen by triage RN and is awaiting for room in back

## 2019-09-09 ENCOUNTER — Inpatient Hospital Stay (HOSPITAL_COMMUNITY): Payer: Medicare Other

## 2019-09-09 DIAGNOSIS — N179 Acute kidney failure, unspecified: Secondary | ICD-10-CM

## 2019-09-09 DIAGNOSIS — E785 Hyperlipidemia, unspecified: Secondary | ICD-10-CM

## 2019-09-09 DIAGNOSIS — Z862 Personal history of diseases of the blood and blood-forming organs and certain disorders involving the immune mechanism: Secondary | ICD-10-CM

## 2019-09-09 DIAGNOSIS — K219 Gastro-esophageal reflux disease without esophagitis: Secondary | ICD-10-CM

## 2019-09-09 DIAGNOSIS — E2740 Unspecified adrenocortical insufficiency: Secondary | ICD-10-CM

## 2019-09-09 LAB — COMPREHENSIVE METABOLIC PANEL
ALT: 25 U/L (ref 0–44)
AST: 22 U/L (ref 15–41)
Albumin: 3 g/dL — ABNORMAL LOW (ref 3.5–5.0)
Alkaline Phosphatase: 93 U/L (ref 38–126)
Anion gap: 7 (ref 5–15)
BUN: 25 mg/dL — ABNORMAL HIGH (ref 8–23)
CO2: 21 mmol/L — ABNORMAL LOW (ref 22–32)
Calcium: 7.9 mg/dL — ABNORMAL LOW (ref 8.9–10.3)
Chloride: 109 mmol/L (ref 98–111)
Creatinine, Ser: 1.39 mg/dL — ABNORMAL HIGH (ref 0.61–1.24)
GFR calc Af Amer: 59 mL/min — ABNORMAL LOW (ref >60)
GFR calc non Af Amer: 51 mL/min — ABNORMAL LOW (ref >60)
Glucose, Bld: 149 mg/dL — ABNORMAL HIGH (ref 70–99)
Potassium: 4.3 mmol/L (ref 3.5–5.1)
Sodium: 137 mmol/L (ref 135–145)
Total Bilirubin: 0.6 mg/dL (ref 0.3–1.2)
Total Protein: 5.7 g/dL — ABNORMAL LOW (ref 6.5–8.1)

## 2019-09-09 LAB — CULTURE, BLOOD (ROUTINE X 2)
Culture: NO GROWTH
Culture: NO GROWTH
Special Requests: ADEQUATE
Special Requests: ADEQUATE

## 2019-09-09 LAB — CBC
HCT: 37.8 % — ABNORMAL LOW (ref 39.0–52.0)
Hemoglobin: 12.2 g/dL — ABNORMAL LOW (ref 13.0–17.0)
MCH: 29.3 pg (ref 26.0–34.0)
MCHC: 32.3 g/dL (ref 30.0–36.0)
MCV: 90.9 fL (ref 80.0–100.0)
Platelets: 341 10*3/uL (ref 150–400)
RBC: 4.16 MIL/uL — ABNORMAL LOW (ref 4.22–5.81)
RDW: 15.5 % (ref 11.5–15.5)
WBC: 15.8 10*3/uL — ABNORMAL HIGH (ref 4.0–10.5)
nRBC: 0 % (ref 0.0–0.2)

## 2019-09-09 LAB — GLUCOSE, CAPILLARY
Glucose-Capillary: 146 mg/dL — ABNORMAL HIGH (ref 70–99)
Glucose-Capillary: 127 mg/dL — ABNORMAL HIGH (ref 70–99)
Glucose-Capillary: 115 mg/dL — ABNORMAL HIGH (ref 70–99)
Glucose-Capillary: 110 mg/dL — ABNORMAL HIGH (ref 70–99)
Glucose-Capillary: 126 mg/dL — ABNORMAL HIGH (ref 70–99)

## 2019-09-09 LAB — SARS CORONAVIRUS 2 (TAT 6-24 HRS): SARS Coronavirus 2: NEGATIVE

## 2019-09-09 LAB — ABO/RH: ABO/RH(D): O POS

## 2019-09-09 MED ORDER — SODIUM CHLORIDE 0.9 % IV SOLN: INTRAVENOUS | Status: DC

## 2019-09-09 MED ORDER — HYDROCORTISONE 1 % EX CREA
1.0000 "application " | TOPICAL_CREAM | CUTANEOUS | Status: DC | PRN
  Filled 2019-09-09: qty 28

## 2019-09-09 MED ORDER — FLUTICASONE PROPIONATE 50 MCG/ACT NA SUSP
2.0000 | Freq: Every day | NASAL | Status: DC | PRN
  Filled 2019-09-09: qty 16

## 2019-09-09 MED ORDER — BUDESONIDE 0.5 MG/2ML IN SUSP
0.5000 mg | Freq: Two times a day (BID) | RESPIRATORY_TRACT | Status: DC
  Administered 2019-09-09 – 2019-09-14 (×10): 0.5 mg via RESPIRATORY_TRACT
  Filled 2019-09-09 (×10): qty 2

## 2019-09-09 MED ORDER — INSULIN ASPART 100 UNIT/ML ~~LOC~~ SOLN: 0.0000 [IU] | Freq: Three times a day (TID) | SUBCUTANEOUS | Status: DC

## 2019-09-09 MED ORDER — ACETAMINOPHEN 325 MG PO TABS: 650.0000 mg | ORAL_TABLET | Freq: Four times a day (QID) | ORAL | Status: DC | PRN

## 2019-09-09 MED ORDER — POLYVINYL ALCOHOL 1.4 % OP SOLN
1.0000 [drp] | OPHTHALMIC | Status: DC | PRN
  Filled 2019-09-09: qty 15

## 2019-09-09 MED ORDER — SODIUM CHLORIDE 0.9 % IV SOLN
2.0000 g | Freq: Three times a day (TID) | INTRAVENOUS | Status: DC
  Administered 2019-09-09 – 2019-09-14 (×15): 2 g via INTRAVENOUS
  Filled 2019-09-09 (×16): qty 2

## 2019-09-09 MED ORDER — GUAIFENESIN ER 600 MG PO TB12
600.0000 mg | ORAL_TABLET | Freq: Every day | ORAL | Status: DC
  Administered 2019-09-09 – 2019-09-14 (×6): 600 mg via ORAL
  Filled 2019-09-09 (×6): qty 1

## 2019-09-09 MED ORDER — ENOXAPARIN SODIUM 40 MG/0.4ML ~~LOC~~ SOLN
40.0000 mg | SUBCUTANEOUS | Status: DC
  Administered 2019-09-09 – 2019-09-14 (×6): 40 mg via SUBCUTANEOUS
  Filled 2019-09-09 (×6): qty 0.4

## 2019-09-09 MED ORDER — FLUTICASONE PROPIONATE HFA 220 MCG/ACT IN AERO: 2.0000 | INHALATION_SPRAY | Freq: Two times a day (BID) | RESPIRATORY_TRACT | Status: DC

## 2019-09-09 MED ORDER — VITAMIN D3 25 MCG (1000 UNIT) PO TABS
2000.0000 [IU] | ORAL_TABLET | Freq: Every day | ORAL | Status: DC
  Administered 2019-09-09 – 2019-09-13 (×5): 2000 [IU] via ORAL
  Filled 2019-09-09 (×5): qty 2

## 2019-09-09 MED ORDER — ALBUTEROL SULFATE (2.5 MG/3ML) 0.083% IN NEBU: 2.5000 mg | INHALATION_SOLUTION | Freq: Four times a day (QID) | RESPIRATORY_TRACT | Status: DC | PRN

## 2019-09-09 MED ORDER — ONDANSETRON HCL 4 MG/2ML IJ SOLN
4.0000 mg | Freq: Four times a day (QID) | INTRAMUSCULAR | Status: DC | PRN
  Administered 2019-09-12: 4 mg via INTRAVENOUS
  Filled 2019-09-09: qty 2

## 2019-09-09 MED ORDER — ONDANSETRON 4 MG PO TBDP: 8.0000 mg | ORAL_TABLET | Freq: Three times a day (TID) | ORAL | Status: DC | PRN

## 2019-09-09 MED ORDER — INSULIN ASPART 100 UNIT/ML ~~LOC~~ SOLN: 0.0000 [IU] | Freq: Every day | SUBCUTANEOUS | Status: DC

## 2019-09-09 MED ORDER — DIPHENHYDRAMINE HCL 25 MG PO CAPS: 25.0000 mg | ORAL_CAPSULE | Freq: Every evening | ORAL | Status: DC | PRN

## 2019-09-09 MED ORDER — ACYCLOVIR 400 MG PO TABS
400.0000 mg | ORAL_TABLET | Freq: Two times a day (BID) | ORAL | Status: DC
  Administered 2019-09-09 – 2019-09-14 (×11): 400 mg via ORAL
  Filled 2019-09-09 (×12): qty 1

## 2019-09-09 MED ORDER — ROSUVASTATIN CALCIUM 10 MG PO TABS
10.0000 mg | ORAL_TABLET | Freq: Every day | ORAL | Status: DC
  Administered 2019-09-09 – 2019-09-13 (×5): 10 mg via ORAL
  Filled 2019-09-09 (×5): qty 1

## 2019-09-09 MED ORDER — MELATONIN 3 MG PO TABS: 3.0000 mg | ORAL_TABLET | Freq: Every evening | ORAL | Status: DC | PRN

## 2019-09-09 MED ORDER — PANTOPRAZOLE SODIUM 40 MG PO TBEC
40.0000 mg | DELAYED_RELEASE_TABLET | Freq: Every day | ORAL | Status: DC
  Administered 2019-09-09 – 2019-09-14 (×6): 40 mg via ORAL
  Filled 2019-09-09 (×6): qty 1

## 2019-09-09 MED ORDER — ASPIRIN EC 325 MG PO TBEC
325.0000 mg | DELAYED_RELEASE_TABLET | Freq: Every day | ORAL | Status: DC
  Administered 2019-09-09 – 2019-09-13 (×6): 325 mg via ORAL
  Filled 2019-09-09 (×6): qty 1

## 2019-09-09 MED ORDER — ONDANSETRON HCL 4 MG PO TABS: 4.0000 mg | ORAL_TABLET | Freq: Four times a day (QID) | ORAL | Status: DC | PRN

## 2019-09-09 MED ORDER — SODIUM CHLORIDE 0.9 % IV SOLN
2.0000 g | Freq: Once | INTRAVENOUS | Status: AC
  Administered 2019-09-09: 2 g via INTRAVENOUS
  Filled 2019-09-09: qty 2

## 2019-09-09 MED ORDER — POLYETHYLENE GLYCOL 3350 17 G PO PACK: 1.0000 | PACK | Freq: Every day | ORAL | Status: DC | PRN

## 2019-09-09 MED ORDER — HYDROCORTISONE NA SUCCINATE PF 100 MG IJ SOLR
100.0000 mg | Freq: Two times a day (BID) | INTRAMUSCULAR | Status: DC
  Administered 2019-09-09 – 2019-09-10 (×3): 100 mg via INTRAVENOUS
  Filled 2019-09-09 (×3): qty 2

## 2019-09-09 MED ORDER — LORATADINE 10 MG PO TABS
10.0000 mg | ORAL_TABLET | Freq: Every day | ORAL | Status: DC
  Administered 2019-09-09 – 2019-09-14 (×6): 10 mg via ORAL
  Filled 2019-09-09 (×6): qty 1

## 2019-09-09 MED ORDER — MONTELUKAST SODIUM 10 MG PO TABS
10.0000 mg | ORAL_TABLET | Freq: Every day | ORAL | Status: DC
  Administered 2019-09-09 – 2019-09-13 (×5): 10 mg via ORAL
  Filled 2019-09-09 (×5): qty 1

## 2019-09-09 MED ORDER — ACETAMINOPHEN 650 MG RE SUPP: 650.0000 mg | Freq: Four times a day (QID) | RECTAL | Status: DC | PRN

## 2019-09-09 MED ORDER — METOPROLOL TARTRATE 25 MG PO TABS
25.0000 mg | ORAL_TABLET | Freq: Two times a day (BID) | ORAL | Status: DC
  Administered 2019-09-09 – 2019-09-10 (×4): 25 mg via ORAL
  Filled 2019-09-09 (×5): qty 1

## 2019-09-09 MED ORDER — SENNOSIDES-DOCUSATE SODIUM 8.6-50 MG PO TABS
2.0000 | ORAL_TABLET | Freq: Every day | ORAL | Status: DC
  Administered 2019-09-09 – 2019-09-13 (×5): 2 via ORAL
  Filled 2019-09-09 (×5): qty 2

## 2019-09-09 MED ORDER — MAGNESIUM OXIDE 400 (241.3 MG) MG PO TABS
400.0000 mg | ORAL_TABLET | Freq: Every day | ORAL | Status: DC
  Administered 2019-09-09 – 2019-09-14 (×6): 400 mg via ORAL
  Filled 2019-09-09 (×6): qty 1

## 2019-09-09 MED ORDER — LORAZEPAM 0.5 MG PO TABS: 0.5000 mg | ORAL_TABLET | ORAL | Status: DC | PRN

## 2019-09-09 MED ORDER — FAMOTIDINE 20 MG PO TABS
20.0000 mg | ORAL_TABLET | Freq: Every day | ORAL | Status: DC
  Administered 2019-09-09 – 2019-09-14 (×6): 20 mg via ORAL
  Filled 2019-09-09 (×6): qty 1

## 2019-09-09 NOTE — Progress Notes (Addendum)
PROGRESS NOTE  Brandon [name] [id number] DOB: April 18, 1951 DOA: 09/08/2019 PCP: [name], MD  HPI/Recap of past 24 hours: HPI from Dr [name] is a 69 y.o. 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 Denies any abdominal pain or diarrhea. Pt had significant work-up and evaluation, was seen by both oncologist as well as infectious disease. Pts endocrinologist and transplant physician at [facility] were also involved. Pt was discharged home on 20 mg of oral hydrocortisone twice a day.  On arrival home he started having nausea and vomiting and abdominal discomfort.  This is typical of his usual adrenal crisis.  He is becoming more dehydrated and feels weak.  He came back to the ER where he is being readmitted with another adrenal crisis. In the ED, labs fairly stable, other work-up essentially within normal.  Patient is being readmitted with recurrent adrenal crisis.    Today, pt reports N/V has improved, still with generalized weakness.  Patient denies worsening abdominal pain, chest pain, shortness of breath, cough, diarrhea, fever/chills.   Assessment/Plan: Principal Problem:   Adrenal crisis (HCC) Active Problems:   Hyperlipidemia   GERD (gastroesophageal reflux disease)   History of myelodysplastic syndrome   Adrenocortical insufficiency (HCC)   History of DVT (deep vein thrombosis)  Intractable nausea/vomiting likely 2/2 adrenal crisis of unclear etiology Adrenal crisis likely 2/2 tapering of steroids Currently afebrile, with leukocytosis (on steroids) CT chest/abdomen/pelvis done 3/13 with no acute findings except for possible cystitis of which ID recommended no further  antibiotics Currently no signs of active infective process, seen by oncology during previous admission, reviewed peripheral smear no evidence of recurrent MDS or leukemia Restarted patient on IV hydrocortisone twice daily Continue antiemetics as needed Plan to touch base with oncologist Dr. [name] [phone]) and endocrinologist at [facility]  AKI Continue IV fluids Daily BMP  Diabetes mellitus type 2 Steroid-induced Continue SSI, Accu-Cheks, hypoglycemic protocol Hold home Metformin  GERD Continue PPI, famotidine  History of CVA Continue aspirin, statins  History of MDS status post stem cell transplant Outpatient follow-up with oncologist  Obesity Lifestyle modification advised          Malnutrition Type:      Malnutrition Characteristics:      Nutrition Interventions:       Estimated body mass index is 33.47 kg/m as calculated from the following:   Height as of this encounter: 5\' 10"  (1.778 m).   Weight as of this encounter: 105.8 kg.     Code Status: Full  Family Communication: Discussed extensively with patient  Disposition Plan: Patient is from home, anticipate discharge to home in the next 24 to 48 hours pending switching to p.o. steroids   Consultants:  None  Procedures:  None  Antimicrobials:  None  DVT prophylaxis: Lovenox   Objective: Vitals:   09/08/19 2258 09/08/19 2345 09/09/19 0030 09/09/19 0203  BP: 119/67 102/66 112/68 108/60  Pulse: (!) 105 91 90 84  Resp: 20 20 19 17   Temp:    99.4 F (37.4 C)  TempSrc:    Oral  SpO2: 94% 95% 95% 94%  Weight:      Height:        Intake/Output Summary (Last 24 hours) at 09/09/2019 1037 Last data filed at 09/09/2019  0847 Gross per 24 hour  Intake 1450 ml  Output --  Net 1450 ml   Filed Weights   09/08/19 1929  Weight: 105.8 kg    Exam:  General: NAD   Cardiovascular: S1, S2 present  Respiratory: CTAB  Abdomen: Soft, nontender, nondistended, bowel sounds  present  Musculoskeletal: No bilateral pedal edema noted  Skin: Normal  Psychiatry: Normal mood   Data Reviewed: CBC: Recent Labs  Lab 09/05/19 0409 09/06/19 0644 09/07/19 0820 09/08/19 1935 09/09/19 0613  WBC 9.2 7.3 6.8 21.9* 15.8*  NEUTROABS 6.0 3.6 2.7  --   --   HGB 11.4* 11.4* 11.9* 15.8 12.2*  HCT 35.6* 36.0* 37.3* 49.1 37.8*  MCV 92.5 93.5 92.6 90.4 90.9  PLT 297 295 339 418* [id number]   Basic Metabolic Panel: Recent Labs  Lab 09/05/19 0409 09/06/19 0644 09/07/19 0820 09/08/19 1935 09/09/19 0613  NA 138 138 137 137 137  K 4.1 4.1 3.8 4.2 4.3  CL 109 109 107 105 109  CO2 22 24 23  21* 21*  GLUCOSE 115* 105* 87 144* 149*  BUN 22 15 12 18  25*  CREATININE 1.06 1.06 1.00 1.36* 1.39*  CALCIUM 8.2* 8.1* 8.2* 9.2 7.9*  MG  --   --  1.9  --   --    GFR: Estimated Creatinine Clearance: 60.2 mL/min (A) (by C-G formula based on SCr of 1.39 mg/dL (H)). Liver Function Tests: Recent Labs  Lab 09/03/19 1950 09/06/19 0644 09/07/19 0820 09/08/19 1935 09/09/19 0613  AST 30 15 17 30 22   ALT 22 16 16 28 25   ALKPHOS 114 67 71 141* 93  BILITOT 0.8 0.7 0.5 0.7 0.6  PROT 7.9 5.4* 5.7* 7.3 5.7*  ALBUMIN 4.2 2.8* 3.0* 3.9 3.0*   Recent Labs  Lab 09/03/19 1950 09/08/19 1935  LIPASE 22 23   No results for input(s): AMMONIA in the last 168 hours. Coagulation Profile: No results for input(s): INR, PROTIME in the last 168 hours. Cardiac Enzymes: No results for input(s): CKTOTAL, CKMB, CKMBINDEX, TROPONINI in the last 168 hours. BNP (last 3 results) No results for input(s): PROBNP in the last 8760 hours. HbA1C: No results for input(s): HGBA1C in the last 72 hours. CBG: Recent Labs  Lab 09/08/19 0741 09/08/19 1303 09/08/19 2018 09/09/19 0214 09/09/19 0743  GLUCAP 83 128* 115* 146* 127*   Lipid Profile: No results for input(s): CHOL, HDL, LDLCALC, TRIG, CHOLHDL, LDLDIRECT in the last 72 hours. Thyroid Function Tests: No results for input(s): TSH, T4TOTAL, FREET4,  T3FREE, THYROIDAB in the last 72 hours. Anemia Panel: No results for input(s): VITAMINB12, FOLATE, FERRITIN, TIBC, IRON, RETICCTPCT in the last 72 hours. Urine analysis:    Component Value Date/Time   COLORURINE YELLOW 09/08/2019 2120   APPEARANCEUR HAZY (A) 09/08/2019 2120   LABSPEC 1.011 09/08/2019 2120   PHURINE 5.0 09/08/2019 2120   GLUCOSEU NEGATIVE 09/08/2019 2120   GLUCOSEU NEGATIVE 12/23/2009 0742   HGBUR NEGATIVE 09/08/2019 2120   HGBUR negative 01/24/2007 0810   BILIRUBINUR NEGATIVE 09/08/2019 2120   BILIRUBINUR negative 08/10/2014 1057   BILIRUBINUR n 02/11/2013 1059   KETONESUR NEGATIVE 09/08/2019 2120   PROTEINUR NEGATIVE 09/08/2019 2120   UROBILINOGEN 0.2 08/10/2014 1057   UROBILINOGEN 0.2 12/23/2009 0742   NITRITE NEGATIVE 09/08/2019 2120   LEUKOCYTESUR SMALL (A) 09/08/2019 2120   Sepsis Labs: @LABRCNTIP (procalcitonin:4,lacticidven:4)  ) Recent Results (from the past 240 hour(s))  SARS CORONAVIRUS 2 (TAT 6-24 HRS) Nasopharyngeal Nasopharyngeal Swab     Status: None   Collection  Time: 09/03/19 10:07 PM   Specimen: Nasopharyngeal Swab  Result Value Ref Range Status   SARS Coronavirus 2 NEGATIVE NEGATIVE Final    Comment: (NOTE) SARS-CoV-2 target nucleic acids are NOT DETECTED. The SARS-CoV-2 RNA is generally detectable in upper and lower respiratory specimens during the acute phase of infection. Negative results do not preclude SARS-CoV-2 infection, do not rule out co-infections with other pathogens, and should not be used as the sole basis for treatment or other patient management decisions. Negative results must be combined with clinical observations, patient history, and epidemiological information. The expected result is Negative. Fact Sheet for Patients: SugarRoll.be Fact Sheet for Healthcare Providers: https://www.woods-mathews.com/ This test is not yet approved or cleared by the [place] FDA and  has  been authorized for detection and/or diagnosis of SARS-CoV-2 by FDA under an Emergency Use Authorization (EUA). This EUA will remain  in effect (meaning this test can be used) for the duration of the COVID-19 declaration under Section 56 4(b)(1) of the Act, 21 U.S.C. section 360bbb-3(b)(1), unless the authorization is terminated or revoked sooner. Performed at [facility], [place] [address], [place], [place] [address]   Culture, blood (routine x 2)     Status: None   Collection Time: 09/04/19  6:23 AM   Specimen: BLOOD  Result Value Ref Range Status   Specimen Description   Final    BLOOD LEFT ANTECUBITAL Performed at [facility] [address] [place], [place] [address]    Special Requests   Final    BOTTLES DRAWN AEROBIC AND ANAEROBIC Blood Culture adequate volume Performed at [facility] [address], [place], [place] [address]    Culture   Final    NO GROWTH 5 DAYS Performed at [facility], [place] [address], [place], [place] [address]    Report Status 09/09/2019 FINAL  Final  Culture, Urine     Status: None   Collection Time: 09/06/19  7:49 AM   Specimen: Urine, Random  Result Value Ref Range Status   Specimen Description   Final    URINE, RANDOM Performed at [facility] [address], [place], [place] [address]    Special Requests   Final    Immunocompromised Performed at [facility], [place] [address], [place], [place] [address]    Culture   Final    NO GROWTH Performed at [facility], [place] [address] [place], [place] [address]    Report Status 09/07/2019 FINAL  Final  SARS CORONAVIRUS 2 (TAT 6-24 HRS) Nasopharyngeal Nasopharyngeal Swab     Status: None   Collection Time: 09/09/19  1:09 AM   Specimen: Nasopharyngeal Swab  Result Value Ref Range Status   SARS Coronavirus 2 NEGATIVE NEGATIVE Final    Comment: (NOTE) SARS-CoV-2 target nucleic acids are NOT  DETECTED. The SARS-CoV-2 RNA is generally detectable in upper and lower respiratory specimens during the acute phase of infection. Negative results do not preclude SARS-CoV-2 infection, do not rule out co-infections with other pathogens, and should not be used as the sole basis for treatment or other patient management decisions. Negative results must be combined with clinical observations, patient history, and epidemiological information. The expected result is Negative. Fact Sheet for Patients: SugarRoll.be Fact Sheet for Healthcare Providers: https://www.woods-mathews.com/ This test is not yet approved or cleared by the [place] FDA and  has been authorized for detection and/or diagnosis of SARS-CoV-2 by FDA under an Emergency Use  Authorization (EUA). This EUA will remain  in effect (meaning this test can be used) for the duration of the COVID-19 declaration under Section 56 4(b)(1) of the Act, 21 U.S.C. section 360bbb-3(b)(1), unless the authorization is terminated or revoked sooner. Performed at [facility], [place] [address] [place], [place] [address]       Studies: No results found.  Scheduled Meds: . acyclovir  400 mg Oral BID  . aspirin EC  325 mg Oral QHS  . budesonide (PULMICORT) nebulizer solution  0.5 mg Nebulization BID  . cholecalciferol  2,000 Units Oral QHS  . enoxaparin (LOVENOX) injection  40 mg Subcutaneous Q24H  . famotidine  20 mg Oral Daily  . guaiFENesin  600 mg Oral Daily  . hydrocortisone sod succinate (SOLU-CORTEF) inj  100 mg Intravenous Q12H  . insulin aspart  0-20 Units Subcutaneous TID WC  . insulin aspart  0-5 Units Subcutaneous QHS  . loratadine  10 mg Oral Daily  . magnesium oxide  400 mg Oral Daily  . metoprolol tartrate  25 mg Oral BID  . montelukast  10 mg Oral Daily  . pantoprazole  40 mg Oral Daily  . rosuvastatin  10 mg Oral QHS  . senna-docusate  2 tablet Oral QHS  . sodium  chloride flush  3 mL Intravenous Once    Continuous Infusions: . sodium chloride    . sodium chloride 150 mL/hr at 09/09/19 0230     LOS: 1 day     Alma Friendly, MD [facility]  If 7PM-7AM, please contact night-coverage [email or web address] 09/09/2019, 10:37 AM

## 2019-09-09 NOTE — Progress Notes (Signed)
Pharmacy Antibiotic Note  Brandon [name] is a 69 y.o. male admitted on 09/08/2019 with N/V.  Pharmacy has been consulted for cefepime dosing.  Plan: -Cefepime 2 g IV q8h -Follow renal function and culture data  Height: 5\' 10"  (177.8 cm) Weight: 233 lb 4 oz (105.8 kg) IBW/kg (Calculated) : 73  Temp (24hrs), Avg:98.3 F (36.8 C), Min:97.5 F (36.4 C), Max:99.4 F (37.4 C)  Recent Labs  Lab 09/04/19 0528 09/04/19 0623 09/05/19 0409 09/06/19 0644 09/07/19 0820 09/08/19 1935 09/09/19 0613  WBC   < >  --  9.2 7.3 6.8 21.9* 15.8*  CREATININE   < >  --  1.06 1.06 1.00 1.36* 1.39*  LATICACIDVEN  --  1.6  --  0.9  --   --   --    < > = values in this interval not displayed.    Estimated Creatinine Clearance: 60.2 mL/min (A) (by C-G formula based on SCr of 1.39 mg/dL (H)).    Allergies  Allergen Reactions  . Tape Other (See Comments)    Skin irritation that may cause a scab if left on too long.     Antimicrobials this admission: cefepime 3/17 >>   Dose adjustments this admission:  Microbiology results: 3/17 BCx: Lillia Pauls [facility] 09/09/2019 11:52 AM

## 2019-09-10 ENCOUNTER — Encounter: Payer: Self-pay | Admitting: Family Medicine

## 2019-09-10 LAB — CBC WITH DIFFERENTIAL/PLATELET
Abs Immature Granulocytes: 0.02 10*3/uL (ref 0.00–0.07)
Basophils Absolute: 0 10*3/uL (ref 0.0–0.1)
Basophils Relative: 0 %
Eosinophils Absolute: 0 10*3/uL (ref 0.0–0.5)
Eosinophils Relative: 0 %
HCT: 33.8 % — ABNORMAL LOW (ref 39.0–52.0)
Hemoglobin: 10.8 g/dL — ABNORMAL LOW (ref 13.0–17.0)
Immature Granulocytes: 0 %
Lymphocytes Relative: 31 %
Lymphs Abs: 2.6 10*3/uL (ref 0.7–4.0)
MCH: 29.5 pg (ref 26.0–34.0)
MCHC: 32 g/dL (ref 30.0–36.0)
MCV: 92.3 fL (ref 80.0–100.0)
Monocytes Absolute: 0.7 10*3/uL (ref 0.1–1.0)
Monocytes Relative: 8 %
Neutro Abs: 5.1 10*3/uL (ref 1.7–7.7)
Neutrophils Relative %: 61 %
Platelets: 290 10*3/uL (ref 150–400)
RBC: 3.66 MIL/uL — ABNORMAL LOW (ref 4.22–5.81)
RDW: 15.8 % — ABNORMAL HIGH (ref 11.5–15.5)
WBC: 8.4 10*3/uL (ref 4.0–10.5)
nRBC: 0 % (ref 0.0–0.2)

## 2019-09-10 LAB — BASIC METABOLIC PANEL
Anion gap: 4 — ABNORMAL LOW (ref 5–15)
BUN: 19 mg/dL (ref 8–23)
CO2: 22 mmol/L (ref 22–32)
Calcium: 7.8 mg/dL — ABNORMAL LOW (ref 8.9–10.3)
Chloride: 113 mmol/L — ABNORMAL HIGH (ref 98–111)
Creatinine, Ser: 1.07 mg/dL (ref 0.61–1.24)
GFR calc Af Amer: >60 mL/min (ref >60)
GFR calc non Af Amer: >60 mL/min (ref >60)
Glucose, Bld: 137 mg/dL — ABNORMAL HIGH (ref 70–99)
Potassium: 4.3 mmol/L (ref 3.5–5.1)
Sodium: 139 mmol/L (ref 135–145)

## 2019-09-10 LAB — GLUCOSE, CAPILLARY
Glucose-Capillary: 106 mg/dL — ABNORMAL HIGH (ref 70–99)
Glucose-Capillary: 94 mg/dL (ref 70–99)
Glucose-Capillary: 122 mg/dL — ABNORMAL HIGH (ref 70–99)
Glucose-Capillary: 119 mg/dL — ABNORMAL HIGH (ref 70–99)

## 2019-09-10 MED ORDER — HYDROCORTISONE NA SUCCINATE PF 100 MG IJ SOLR
50.0000 mg | Freq: Two times a day (BID) | INTRAMUSCULAR | Status: DC
  Administered 2019-09-10 – 2019-09-11 (×2): 50 mg via INTRAVENOUS
  Filled 2019-09-10 (×2): qty 2

## 2019-09-10 NOTE — Progress Notes (Signed)
PROGRESS NOTE  Brandon [name] [id number] DOB: 1950/11/04 DOA: 09/08/2019 PCP: [name], MD  HPI/Recap of past 24 hours: HPI from Dr [name] is a 69 y.o. 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 Denies any abdominal pain or diarrhea. Pt had significant work-up and evaluation, was seen by both oncologist as well as infectious disease. Pts endocrinologist and transplant physician at [facility] were also involved. Pt was discharged home on 20 mg of oral hydrocortisone twice a day.  On arrival home he started having nausea and vomiting and abdominal discomfort.  This is typical of his usual adrenal crisis.  He is becoming more dehydrated and feels weak.  He came back to the ER where he is being readmitted with another adrenal crisis. In the ED, labs fairly stable, other work-up essentially within normal.  Patient is being readmitted with recurrent adrenal crisis.    Today, patient reports nausea, but denies any vomiting.  Denies any significant abdominal pain, fever/chills, chest pain, shortness of breath, dizziness.  Had a long chat with patient about his overall care and complicated medical history.  Patient a bit frustrated about his overall health, and was a little teary today.  Encouraged patient.    Assessment/Plan: Principal Problem:   Adrenal crisis (HCC) Active Problems:   Hyperlipidemia   GERD (gastroesophageal reflux disease)   History of myelodysplastic syndrome   Adrenocortical insufficiency (HCC)   History of DVT (deep vein thrombosis)  Intractable nausea/vomiting likely 2/2 adrenal crisis of unclear etiology Adrenal crisis likely 2/2 tapering of steroids Currently  afebrile, with leukocytosis (on steroids) CT chest/abdomen/pelvis done 3/13 with no acute findings except for possible cystitis of which ID recommended no further antibiotics Currently no signs of active infective process, seen by oncology during previous admission, reviewed peripheral smear no evidence of recurrent MDS or leukemia Restarted patient on IV hydrocortisone twice daily, taper down Continue antiemetics as needed Discussed with oncologist Dr. [name] [phone]) on 09/09/2019, recommended starting empiric antibiotics and continue further management  Discussed with patient endocrinologist at [facility], Dr [name] [phone] or [phone]), recommended discharging patient on 30 mg in am, 20 mg in pm  AKI Continue IV fluids Daily BMP  Diabetes mellitus type 2 Steroid-induced Continue SSI, Accu-Cheks, hypoglycemic protocol Hold home Metformin  GERD Continue PPI, famotidine  History of CVA Continue aspirin, statins  History of MDS status post stem cell transplant Outpatient follow-up with oncologist  Obesity Lifestyle modification advised       Malnutrition Type:      Malnutrition Characteristics:      Nutrition Interventions:       Estimated body mass index is 33.47 kg/m as calculated from the following:   Height as of this encounter: 5\' 10"  (1.778 m).   Weight as of this encounter: 105.8 kg.     Code Status: Full  Family Communication: Discussed extensively with patient  Disposition Plan: Patient is from home, anticipate discharge to home in the next 24 to 48 hours pending switching to p.o. steroids   Consultants:  None  Procedures:  None  Antimicrobials:  Cefepime  DVT prophylaxis: Lovenox   Objective: Vitals:   09/09/19 2018 09/10/19 0552 09/10/19 0752 09/10/19 1335  BP: 130/67 124/65  129/80  Pulse: 60 (!) 58  (!)  56  Resp: 17 18  18   Temp: 98.1 F (36.7 C) 97.6 F (36.4 C)  98.1 F (36.7 C)  TempSrc: Oral Oral     SpO2: 95% 96% 96% 95%  Weight:      Height:        Intake/Output Summary (Last 24 hours) at 09/10/2019 1626 Last data filed at 09/10/2019 1000 Gross per 24 hour  Intake 1627.8 ml  Output --  Net 1627.8 ml   Filed Weights   09/08/19 1929  Weight: 105.8 kg    Exam:  General: NAD   Cardiovascular: S1, S2 present  Respiratory: CTAB  Abdomen: Soft, nontender, nondistended, bowel sounds present  Musculoskeletal: No bilateral pedal edema noted  Skin: Normal  Psychiatry: Normal mood   Data Reviewed: CBC: Recent Labs  Lab 09/05/19 0409 09/05/19 0409 09/06/19 0644 09/07/19 0820 09/08/19 1935 09/09/19 0613 09/10/19 0558  WBC 9.2   < > 7.3 6.8 21.9* 15.8* 8.4  NEUTROABS 6.0  --  3.6 2.7  --   --  5.1  HGB 11.4*   < > 11.4* 11.9* 15.8 12.2* 10.8*  HCT 35.6*   < > 36.0* 37.3* 49.1 37.8* 33.8*  MCV 92.5   < > 93.5 92.6 90.4 90.9 92.3  PLT 297   < > 295 339 418* 341 290   < > = values in this interval not displayed.   Basic Metabolic Panel: Recent Labs  Lab 09/06/19 0644 09/07/19 0820 09/08/19 1935 09/09/19 0613 09/10/19 0558  NA 138 137 137 137 139  K 4.1 3.8 4.2 4.3 4.3  CL 109 107 105 109 113*  CO2 24 23 21* 21* 22  GLUCOSE 105* 87 144* 149* 137*  BUN 15 12 18  25* 19  CREATININE 1.06 1.00 1.36* 1.39* 1.07  CALCIUM 8.1* 8.2* 9.2 7.9* 7.8*  MG  --  1.9  --   --   --    GFR: Estimated Creatinine Clearance: 78.2 mL/min (by C-G formula based on SCr of 1.07 mg/dL). Liver Function Tests: Recent Labs  Lab 09/03/19 1950 09/06/19 0644 09/07/19 0820 09/08/19 1935 09/09/19 0613  AST 30 15 17 30 22   ALT 22 16 16 28 25   ALKPHOS 114 67 71 141* 93  BILITOT 0.8 0.7 0.5 0.7 0.6  PROT 7.9 5.4* 5.7* 7.3 5.7*  ALBUMIN 4.2 2.8* 3.0* 3.9 3.0*   Recent Labs  Lab 09/03/19 1950 09/08/19 1935  LIPASE 22 23   No results for input(s): AMMONIA in the last 168 hours. Coagulation Profile: No results for input(s): INR, PROTIME in the last 168 hours. Cardiac  Enzymes: No results for input(s): CKTOTAL, CKMB, CKMBINDEX, TROPONINI in the last 168 hours. BNP (last 3 results) No results for input(s): PROBNP in the last 8760 hours. HbA1C: No results for input(s): HGBA1C in the last 72 hours. CBG: Recent Labs  Lab 09/09/19 1146 09/09/19 1647 09/09/19 2021 09/10/19 0736 09/10/19 1226  GLUCAP 115* 110* 126* 106* 94   Lipid Profile: No results for input(s): CHOL, HDL, LDLCALC, TRIG, CHOLHDL, LDLDIRECT in the last 72 hours. Thyroid Function Tests: No results for input(s): TSH, T4TOTAL, FREET4, T3FREE, THYROIDAB in the last 72 hours. Anemia Panel: No results for input(s): VITAMINB12, FOLATE, FERRITIN, TIBC, IRON, RETICCTPCT in the last 72 hours. Urine analysis:    Component Value Date/Time   COLORURINE YELLOW 09/08/2019 2120   APPEARANCEUR HAZY (A) 09/08/2019 2120   LABSPEC 1.011 09/08/2019 2120   PHURINE 5.0 09/08/2019 2120   GLUCOSEU NEGATIVE 09/08/2019 2120  GLUCOSEU NEGATIVE 12/23/2009 0742   HGBUR NEGATIVE 09/08/2019 2120   HGBUR negative 01/24/2007 0810   BILIRUBINUR NEGATIVE 09/08/2019 2120   BILIRUBINUR negative 08/10/2014 1057   BILIRUBINUR n 02/11/2013 1059   KETONESUR NEGATIVE 09/08/2019 2120   PROTEINUR NEGATIVE 09/08/2019 2120   UROBILINOGEN 0.2 08/10/2014 1057   UROBILINOGEN 0.2 12/23/2009 0742   NITRITE NEGATIVE 09/08/2019 2120   LEUKOCYTESUR SMALL (A) 09/08/2019 2120   Sepsis Labs: @LABRCNTIP (procalcitonin:4,lacticidven:4)  ) Recent Results (from the past 240 hour(s))  SARS CORONAVIRUS 2 (TAT 6-24 HRS) Nasopharyngeal Nasopharyngeal Swab     Status: None   Collection Time: 09/03/19 10:07 PM   Specimen: Nasopharyngeal Swab  Result Value Ref Range Status   SARS Coronavirus 2 NEGATIVE NEGATIVE Final    Comment: (NOTE) SARS-CoV-2 target nucleic acids are NOT DETECTED. The SARS-CoV-2 RNA is generally detectable in upper and lower respiratory specimens during the acute phase of infection. Negative results do not  preclude SARS-CoV-2 infection, do not rule out co-infections with other pathogens, and should not be used as the sole basis for treatment or other patient management decisions. Negative results must be combined with clinical observations, patient history, and epidemiological information. The expected result is Negative. Fact Sheet for Patients: SugarRoll.be Fact Sheet for Healthcare Providers: https://www.woods-mathews.com/ This test is not yet approved or cleared by the [place] FDA and  has been authorized for detection and/or diagnosis of SARS-CoV-2 by FDA under an Emergency Use Authorization (EUA). This EUA will remain  in effect (meaning this test can be used) for the duration of the COVID-19 declaration under Section 56 4(b)(1) of the Act, 21 U.S.C. section 360bbb-3(b)(1), unless the authorization is terminated or revoked sooner. Performed at [facility], [place] [address] [place], [place] [address]   Culture, blood (routine x 2)     Status: None   Collection Time: 09/04/19  6:23 AM   Specimen: BLOOD  Result Value Ref Range Status   Specimen Description   Final    BLOOD LEFT ANTECUBITAL Performed at [facility] [address], [place], [place] [address]    Special Requests   Final    BOTTLES DRAWN AEROBIC AND ANAEROBIC Blood Culture adequate volume Performed at [facility] [address], [place], [place] [address]    Culture   Final    NO GROWTH 5 DAYS Performed at [facility], [place] [address], [place], [place] [address]    Report Status 09/09/2019 FINAL  Final  Culture, blood (routine x 2)     Status: None   Collection Time: 09/04/19  6:24 AM   Specimen: BLOOD LEFT ARM  Result Value Ref Range Status   Specimen Description   Final    BLOOD LEFT ARM Performed at [facility] [address], [place], [place] [address]    Special Requests   Final     BOTTLES DRAWN AEROBIC AND ANAEROBIC Blood Culture adequate volume Performed at [facility] [address], [place], [place] [address]    Culture   Final    NO GROWTH 5 DAYS Performed at [facility], [place] [address] [place], [place] [address]    Report Status 09/09/2019 FINAL  Final  Culture, Urine     Status: None   Collection Time: 09/06/19  7:49 AM   Specimen: Urine, Random  Result Value Ref Range Status   Specimen Description   Final    URINE, RANDOM Performed at [facility], [place] [address] [place], [place] [address]    Special Requests   Final    Immunocompromised Performed at [facility], [place] [address] [place], [place] [address]    Culture   Final    NO GROWTH Performed at [facility], [place] [address] [place], [place] [address]    Report Status 09/07/2019 FINAL  Final  SARS CORONAVIRUS 2 (TAT 6-24 HRS) Nasopharyngeal Nasopharyngeal Swab     Status: None   Collection Time: 09/09/19  1:09 AM   Specimen: Nasopharyngeal Swab  Result Value Ref Range Status   SARS Coronavirus 2 NEGATIVE NEGATIVE Final    Comment: (NOTE) SARS-CoV-2 target nucleic acids are NOT DETECTED. The SARS-CoV-2 RNA is generally detectable in upper and lower respiratory specimens during the acute phase of infection. Negative results do not preclude SARS-CoV-2 infection, do not rule out co-infections with other pathogens, and should not be used as the sole basis for treatment or other patient management decisions. Negative results must be combined with clinical observations, patient history, and epidemiological information. The expected result is Negative. Fact Sheet for Patients: SugarRoll.be Fact Sheet for Healthcare Providers: https://www.woods-mathews.com/ This test is not yet approved or cleared by the [place] FDA and  has been authorized for detection and/or diagnosis of  SARS-CoV-2 by FDA under an Emergency Use Authorization (EUA). This EUA will remain  in effect (meaning this test can be used) for the duration of the COVID-19 declaration under Section 56 4(b)(1) of the Act, 21 U.S.C. section 360bbb-3(b)(1), unless the authorization is terminated or revoked sooner. Performed at [facility], [place] [address], [place], [place] [address]   Culture, blood (routine x 2)     Status: None (Preliminary result)   Collection Time: 09/09/19 11:45 AM   Specimen: BLOOD  Result Value Ref Range Status   Specimen Description   Final    BLOOD LEFT ANTECUBITAL Performed at [facility] [address] [place], [place] [address]    Special Requests   Final    BOTTLES DRAWN AEROBIC ONLY Blood Culture adequate volume Performed at [facility] [address] [place], [place] [address]    Culture   Final    NO GROWTH < 24 HOURS Performed at [facility] [address] [place], [place] [address]    Report Status PENDING  Incomplete  Culture, blood (routine x 2)     Status: None (Preliminary result)   Collection Time: 09/09/19 11:45 AM   Specimen: BLOOD  Result Value Ref Range Status   Specimen Description   Final    BLOOD LEFT ANTECUBITAL Performed at [facility] [address] [place], [place] [address]    Special Requests   Final    BOTTLES DRAWN AEROBIC ONLY Blood Culture adequate volume Performed at [facility] [address], [place], [place] [address]    Culture   Final    NO GROWTH < 24 HOURS Performed at [facility] [address], [place],  [address]    Report Status PENDING  Incomplete      Studies: No results found.  Scheduled Meds: . acyclovir  400 mg Oral BID  . aspirin EC  325 mg Oral QHS  . budesonide (PULMICORT) nebulizer solution  0.5 mg Nebulization BID  . cholecalciferol  2,000 Units Oral QHS  . enoxaparin (LOVENOX)  injection  40 mg Subcutaneous Q24H  . famotidine  20  mg Oral Daily  . guaiFENesin  600 mg Oral Daily  . hydrocortisone sod succinate (SOLU-CORTEF) inj  50 mg Intravenous Q12H  . insulin aspart  0-20 Units Subcutaneous TID WC  . insulin aspart  0-5 Units Subcutaneous QHS  . loratadine  10 mg Oral Daily  . magnesium oxide  400 mg Oral Daily  . metoprolol tartrate  25 mg Oral BID  . montelukast  10 mg Oral Daily  . pantoprazole  40 mg Oral Daily  . rosuvastatin  10 mg Oral QHS  . senna-docusate  2 tablet Oral QHS  . sodium chloride flush  3 mL Intravenous Once    Continuous Infusions: . sodium chloride 125 mL/hr at 09/10/19 1000  . ceFEPime (MAXIPIME) IV 2 g (09/10/19 1432)     LOS: 2 days     Alma Friendly, MD [facility]  If 7PM-7AM, please contact night-coverage [email or web address] 09/10/2019, 4:26 PM

## 2019-09-11 LAB — CBC WITH DIFFERENTIAL/PLATELET
Abs Immature Granulocytes: 0.02 10*3/uL (ref 0.00–0.07)
Basophils Absolute: 0 10*3/uL (ref 0.0–0.1)
Basophils Relative: 0 %
Eosinophils Absolute: 0 10*3/uL (ref 0.0–0.5)
Eosinophils Relative: 0 %
HCT: 32.7 % — ABNORMAL LOW (ref 39.0–52.0)
Hemoglobin: 10.4 g/dL — ABNORMAL LOW (ref 13.0–17.0)
Immature Granulocytes: 0 %
Lymphocytes Relative: 39 %
Lymphs Abs: 2.8 10*3/uL (ref 0.7–4.0)
MCH: 29.6 pg (ref 26.0–34.0)
MCHC: 31.8 g/dL (ref 30.0–36.0)
MCV: 93.2 fL (ref 80.0–100.0)
Monocytes Absolute: 0.7 10*3/uL (ref 0.1–1.0)
Monocytes Relative: 10 %
Neutro Abs: 3.8 10*3/uL (ref 1.7–7.7)
Neutrophils Relative %: 51 %
Platelets: 295 10*3/uL (ref 150–400)
RBC: 3.51 MIL/uL — ABNORMAL LOW (ref 4.22–5.81)
RDW: 15.8 % — ABNORMAL HIGH (ref 11.5–15.5)
WBC: 7.3 10*3/uL (ref 4.0–10.5)
nRBC: 0 % (ref 0.0–0.2)

## 2019-09-11 LAB — GLUCOSE, CAPILLARY
Glucose-Capillary: 88 mg/dL (ref 70–99)
Glucose-Capillary: 97 mg/dL (ref 70–99)
Glucose-Capillary: 149 mg/dL — ABNORMAL HIGH (ref 70–99)
Glucose-Capillary: 140 mg/dL — ABNORMAL HIGH (ref 70–99)

## 2019-09-11 LAB — BASIC METABOLIC PANEL
Anion gap: 5 (ref 5–15)
BUN: 17 mg/dL (ref 8–23)
CO2: 23 mmol/L (ref 22–32)
Calcium: 7.9 mg/dL — ABNORMAL LOW (ref 8.9–10.3)
Chloride: 112 mmol/L — ABNORMAL HIGH (ref 98–111)
Creatinine, Ser: 1.01 mg/dL (ref 0.61–1.24)
GFR calc Af Amer: >60 mL/min (ref >60)
GFR calc non Af Amer: >60 mL/min (ref >60)
Glucose, Bld: 118 mg/dL — ABNORMAL HIGH (ref 70–99)
Potassium: 3.9 mmol/L (ref 3.5–5.1)
Sodium: 140 mmol/L (ref 135–145)

## 2019-09-11 MED ORDER — HYDROCORTISONE 20 MG PO TABS
30.0000 mg | ORAL_TABLET | Freq: Every day | ORAL | Status: DC
  Administered 2019-09-12 – 2019-09-14 (×3): 30 mg via ORAL
  Filled 2019-09-11 (×3): qty 1

## 2019-09-11 MED ORDER — HYDROCORTISONE 20 MG PO TABS
20.0000 mg | ORAL_TABLET | Freq: Every day | ORAL | Status: DC
  Administered 2019-09-11 – 2019-09-14 (×4): 20 mg via ORAL
  Filled 2019-09-11 (×4): qty 1

## 2019-09-11 NOTE — Care Management Important Message (Signed)
Important Message  Patient Details IM Letter given to Roque Lias SW Case Manager to present to the Patient Name: ZAKYE ARNALL MRN: [id number] Date of Birth: July 26, 1950   Medicare Important Message Given:  Yes     Kerin Salen 09/11/2019, 10:29 AM

## 2019-09-11 NOTE — Progress Notes (Signed)
Brady to low 40's while asleep. No distress noted, patient easily aroused. Patient stated bradycardia in his history

## 2019-09-11 NOTE — Progress Notes (Signed)
PROGRESS NOTE  Brandon [name] [id number] DOB: 05-08-1951 DOA: 09/08/2019 PCP: [name], MD  HPI/Recap of past 24 hours: HPI from Dr [name] is a 69 y.o. 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 Denies any abdominal pain or diarrhea. Pt had significant work-up and evaluation, was seen by both oncologist as well as infectious disease. Pts endocrinologist and transplant physician at [facility] were also involved. Pt was discharged home on 20 mg of oral hydrocortisone twice a day.  On arrival home he started having nausea and vomiting and abdominal discomfort.  This is typical of his usual adrenal crisis.  He is becoming more dehydrated and feels weak.  He came back to the ER where he is being readmitted with another adrenal crisis. In the ED, labs fairly stable, other work-up essentially within normal.  Patient is being readmitted with recurrent adrenal crisis.    Today, patient noted to have asymptomatic bradycardia overnight. Denies any new complaints. Plan to switch to PO cortef.    Assessment/Plan: Principal Problem:   Adrenal crisis (HCC) Active Problems:   Hyperlipidemia   GERD (gastroesophageal reflux disease)   History of myelodysplastic syndrome   Adrenocortical insufficiency (HCC)   History of DVT (deep vein thrombosis)  Intractable nausea/vomiting likely 2/2 adrenal crisis of unclear etiology Adrenal crisis likely 2/2 tapering of steroids Currently afebrile, with leukocytosis (on steroids) CT chest/abdomen/pelvis done 3/13 with no acute findings except for possible cystitis of which ID recommended no further antibiotics Currently no signs of active infective  process, seen by oncology during previous admission, reviewed peripheral smear no evidence of recurrent MDS or leukemia Switch to PO cortef 30 mg in am, 20 mg in noon Continue antiemetics as needed Discussed with oncologist Dr. [name] [phone]) on 09/09/2019, recommended starting empiric antibiotics and continue further management  Discussed with patient endocrinologist at [facility], Dr [name] [phone] or [phone]), recommended discharging patient on 30 mg in am, 20 mg in pm  AKI Resolved Daily BMP  Diabetes mellitus type 2 Steroid-induced Continue SSI, Accu-Cheks, hypoglycemic protocol Hold home Metformin  Bradycardia Asymptomatic Hold home metoprolol  GERD Continue PPI, famotidine  History of CVA Continue aspirin, statins  History of MDS status post stem cell transplant Outpatient follow-up with oncologist  Obesity Lifestyle modification advised       Malnutrition Type:      Malnutrition Characteristics:      Nutrition Interventions:       Estimated body mass index is 33.47 kg/m as calculated from the following:   Height as of this encounter: 5\' 10"  (1.778 m).   Weight as of this encounter: 105.8 kg.     Code Status: Full  Family Communication: Discussed extensively with patient  Disposition Plan: Patient is from home, anticipate discharge to home 09/13/19   Consultants:  None  Procedures:  None  Antimicrobials:  Cefepime  DVT prophylaxis: Lovenox   Objective: Vitals:   09/10/19 2054 09/11/19 0448 09/11/19 0720 09/11/19 1547  BP: 138/84 118/75  122/84  Pulse: (!) 54   62  Resp: 20     Temp: 98.6 F (37 C) 97.9 F (36.6 C)  98.1 F (36.7 C)  TempSrc: Oral Oral  Oral  SpO2: 97% 96% 97% 98%  Weight:  Height:        Intake/Output Summary (Last 24 hours) at 09/11/2019 1701 Last data filed at 09/11/2019 1548 Gross per 24 hour  Intake 3148.85 ml  Output --  Net [id number] ml   Filed Weights   09/08/19  1929  Weight: 105.8 kg    Exam:  General: NAD   Cardiovascular: S1, S2 present  Respiratory: CTAB  Abdomen: Soft, nontender, nondistended, bowel sounds present  Musculoskeletal: No bilateral pedal edema noted  Skin: Normal  Psychiatry: Normal mood   Data Reviewed: CBC: Recent Labs  Lab 09/05/19 0409 09/05/19 0409 09/06/19 0644 09/06/19 0644 09/07/19 0820 09/08/19 1935 09/09/19 0613 09/10/19 0558 09/11/19 0522  WBC 9.2   < > 7.3   < > 6.8 21.9* 15.8* 8.4 7.3  NEUTROABS 6.0  --  3.6  --  2.7  --   --  5.1 3.8  HGB 11.4*   < > 11.4*   < > 11.9* 15.8 12.2* 10.8* 10.4*  HCT 35.6*   < > 36.0*   < > 37.3* 49.1 37.8* 33.8* 32.7*  MCV 92.5   < > 93.5   < > 92.6 90.4 90.9 92.3 93.2  PLT 297   < > 295   < > 339 418* 341 290 295   < > = values in this interval not displayed.   Basic Metabolic Panel: Recent Labs  Lab 09/07/19 0820 09/08/19 1935 09/09/19 0613 09/10/19 0558 09/11/19 0522  NA 137 137 137 139 140  K 3.8 4.2 4.3 4.3 3.9  CL 107 105 109 113* 112*  CO2 23 21* 21* 22 23  GLUCOSE 87 144* 149* 137* 118*  BUN 12 18 25* 19 17  CREATININE 1.00 1.36* 1.39* 1.07 1.01  CALCIUM 8.2* 9.2 7.9* 7.8* 7.9*  MG 1.9  --   --   --   --    GFR: Estimated Creatinine Clearance: 82.9 mL/min (by C-G formula based on SCr of 1.01 mg/dL). Liver Function Tests: Recent Labs  Lab 09/06/19 0644 09/07/19 0820 09/08/19 1935 09/09/19 0613  AST 15 17 30 22   ALT 16 16 28 25   ALKPHOS 67 71 141* 93  BILITOT 0.7 0.5 0.7 0.6  PROT 5.4* 5.7* 7.3 5.7*  ALBUMIN 2.8* 3.0* 3.9 3.0*   Recent Labs  Lab 09/08/19 1935  LIPASE 23   No results for input(s): AMMONIA in the last 168 hours. Coagulation Profile: No results for input(s): INR, PROTIME in the last 168 hours. Cardiac Enzymes: No results for input(s): CKTOTAL, CKMB, CKMBINDEX, TROPONINI in the last 168 hours. BNP (last 3 results) No results for input(s): PROBNP in the last 8760 hours. HbA1C: No results for input(s): HGBA1C  in the last 72 hours. CBG: Recent Labs  Lab 09/10/19 1631 09/10/19 2057 09/11/19 0739 09/11/19 1155 09/11/19 1653  GLUCAP 122* 119* 88 97 149*   Lipid Profile: No results for input(s): CHOL, HDL, LDLCALC, TRIG, CHOLHDL, LDLDIRECT in the last 72 hours. Thyroid Function Tests: No results for input(s): TSH, T4TOTAL, FREET4, T3FREE, THYROIDAB in the last 72 hours. Anemia Panel: No results for input(s): VITAMINB12, FOLATE, FERRITIN, TIBC, IRON, RETICCTPCT in the last 72 hours. Urine analysis:    Component Value Date/Time   COLORURINE YELLOW 09/08/2019 2120   APPEARANCEUR HAZY (A) 09/08/2019 2120   LABSPEC 1.011 09/08/2019 2120   PHURINE 5.0 09/08/2019 2120   GLUCOSEU NEGATIVE 09/08/2019 2120   GLUCOSEU NEGATIVE 12/23/2009 0742   HGBUR NEGATIVE 09/08/2019 2120   HGBUR negative 01/24/2007 0810   BILIRUBINUR  NEGATIVE 09/08/2019 2120   BILIRUBINUR negative 08/10/2014 1057   BILIRUBINUR n 02/11/2013 [place] 09/08/2019 2120   PROTEINUR NEGATIVE 09/08/2019 2120   UROBILINOGEN 0.2 08/10/2014 1057   UROBILINOGEN 0.2 12/23/2009 0742   NITRITE NEGATIVE 09/08/2019 2120   LEUKOCYTESUR SMALL (A) 09/08/2019 2120   Sepsis Labs: @LABRCNTIP (procalcitonin:4,lacticidven:4)  ) Recent Results (from the past 240 hour(s))  SARS CORONAVIRUS 2 (TAT 6-24 HRS) Nasopharyngeal Nasopharyngeal Swab     Status: None   Collection Time: 09/03/19 10:07 PM   Specimen: Nasopharyngeal Swab  Result Value Ref Range Status   SARS Coronavirus 2 NEGATIVE NEGATIVE Final    Comment: (NOTE) SARS-CoV-2 target nucleic acids are NOT DETECTED. The SARS-CoV-2 RNA is generally detectable in upper and lower respiratory specimens during the acute phase of infection. Negative results do not preclude SARS-CoV-2 infection, do not rule out co-infections with other pathogens, and should not be used as the sole basis for treatment or other patient management decisions. Negative results must be combined with  clinical observations, patient history, and epidemiological information. The expected result is Negative. Fact Sheet for Patients: SugarRoll.be Fact Sheet for Healthcare Providers: https://www.woods-mathews.com/ This test is not yet approved or cleared by the [place] FDA and  has been authorized for detection and/or diagnosis of SARS-CoV-2 by FDA under an Emergency Use Authorization (EUA). This EUA will remain  in effect (meaning this test can be used) for the duration of the COVID-19 declaration under Section 56 4(b)(1) of the Act, 21 U.S.C. section 360bbb-3(b)(1), unless the authorization is terminated or revoked sooner. Performed at [facility], [place] [address], [place], [place] [address]   Culture, blood (routine x 2)     Status: None   Collection Time: 09/04/19  6:23 AM   Specimen: BLOOD  Result Value Ref Range Status   Specimen Description   Final    BLOOD LEFT ANTECUBITAL Performed at [facility] [address], [place], [place] [address]    Special Requests   Final    BOTTLES DRAWN AEROBIC AND ANAEROBIC Blood Culture adequate volume Performed at [facility] [address] [place], [place] [address]    Culture   Final    NO GROWTH 5 DAYS Performed at [facility], [place] [address] [place], [place] [address]    Report Status 09/09/2019 FINAL  Final  Culture, blood (routine x 2)     Status: None   Collection Time: 09/04/19  6:24 AM   Specimen: BLOOD LEFT ARM  Result Value Ref Range Status   Specimen Description   Final    BLOOD LEFT ARM Performed at [facility] [address], [place], [place] [address]    Special Requests   Final    BOTTLES DRAWN AEROBIC AND ANAEROBIC Blood Culture adequate volume Performed at [facility] [address] [place], [place] [address]    Culture   Final    NO GROWTH 5 DAYS Performed at [facility], [place] [address] [place], [place] [address]    Report Status 09/09/2019 FINAL  Final  Culture, Urine     Status: None   Collection Time: 09/06/19  7:49 AM   Specimen: Urine, Random  Result Value Ref Range Status   Specimen Description   Final    URINE, RANDOM Performed at [facility] [address], [place], [place] [address]    Special Requests   Final  Immunocompromised Performed at [facility], [place] [address], [place], [place] [address]    Culture   Final    NO GROWTH Performed at [facility], [place] [address], [place], [place] [address]    Report Status 09/07/2019 FINAL  Final  SARS CORONAVIRUS 2 (TAT 6-24 HRS) Nasopharyngeal Nasopharyngeal Swab     Status: None   Collection Time: 09/09/19  1:09 AM   Specimen: Nasopharyngeal Swab  Result Value Ref Range Status   SARS Coronavirus 2 NEGATIVE NEGATIVE Final    Comment: (NOTE) SARS-CoV-2 target nucleic acids are NOT DETECTED. The SARS-CoV-2 RNA is generally detectable in upper and lower respiratory specimens during the acute phase of infection. Negative results do not preclude SARS-CoV-2 infection, do not rule out co-infections with other pathogens, and should not be used as the sole basis for treatment or other patient management decisions. Negative results must be combined with clinical observations, patient history, and epidemiological information. The expected result is Negative. Fact Sheet for Patients: SugarRoll.be Fact Sheet for Healthcare Providers: https://www.woods-mathews.com/ This test is not yet approved or cleared by the [place] FDA and  has been authorized for detection and/or diagnosis of SARS-CoV-2 by FDA under an Emergency Use Authorization (EUA). This EUA will remain  in effect (meaning this test can be used) for the duration of the COVID-19 declaration under Section 56 4(b)(1) of the Act, 21  U.S.C. section 360bbb-3(b)(1), unless the authorization is terminated or revoked sooner. Performed at [facility], [place] [address] [place], [place] [address]   Culture, blood (routine x 2)     Status: None (Preliminary result)   Collection Time: 09/09/19 11:45 AM   Specimen: BLOOD  Result Value Ref Range Status   Specimen Description   Final    BLOOD LEFT ANTECUBITAL Performed at [facility] [address] [place], [place] [address]    Special Requests   Final    BOTTLES DRAWN AEROBIC ONLY Blood Culture adequate volume Performed at [facility] [address] [place], [place] [address]    Culture   Final    NO GROWTH 2 DAYS Performed at [facility] [address], [place], [place] [address]    Report Status PENDING  Incomplete  Culture, blood (routine x 2)     Status: None (Preliminary result)   Collection Time: 09/09/19 11:45 AM   Specimen: BLOOD  Result Value Ref Range Status   Specimen Description   Final    BLOOD LEFT ANTECUBITAL Performed at [facility] [address], [place], [place] [address]    Special Requests   Final    BOTTLES DRAWN AEROBIC ONLY Blood Culture adequate volume Performed at [facility] [address], [place], [place] [address]    Culture   Final    NO GROWTH 2 DAYS Performed at [facility] [address] [place], [place] [address]    Report Status PENDING  Incomplete      Studies: No results found.  Scheduled Meds: . acyclovir  400 mg Oral BID  . aspirin EC  325 mg Oral QHS  . budesonide (PULMICORT) nebulizer solution  0.5 mg Nebulization BID  . cholecalciferol  2,000 Units Oral QHS  . enoxaparin (LOVENOX) injection  40 mg Subcutaneous Q24H  . famotidine  20 mg Oral Daily  . guaiFENesin  600 mg Oral Daily  . hydrocortisone  20 mg Oral QPC lunch  . [START  ON 09/12/2019] hydrocortisone  30 mg Oral Q breakfast  . insulin aspart   0-20 Units Subcutaneous TID WC  . insulin aspart  0-5 Units Subcutaneous QHS  . loratadine  10 mg Oral Daily  . magnesium oxide  400 mg Oral Daily  . metoprolol tartrate  25 mg Oral BID  . montelukast  10 mg Oral Daily  . pantoprazole  40 mg Oral Daily  . rosuvastatin  10 mg Oral QHS  . senna-docusate  2 tablet Oral QHS  . sodium chloride flush  3 mL Intravenous Once    Continuous Infusions: . ceFEPime (MAXIPIME) IV 2 g (09/11/19 1434)     LOS: 3 days     Alma Friendly, MD [facility]  If 7PM-7AM, please contact night-coverage [email or web address] 09/11/2019, 5:01 PM

## 2019-09-12 LAB — BASIC METABOLIC PANEL
Anion gap: 6 (ref 5–15)
BUN: 15 mg/dL (ref 8–23)
CO2: 25 mmol/L (ref 22–32)
Calcium: 8.3 mg/dL — ABNORMAL LOW (ref 8.9–10.3)
Chloride: 110 mmol/L (ref 98–111)
Creatinine, Ser: 1.01 mg/dL (ref 0.61–1.24)
GFR calc Af Amer: >60 mL/min (ref >60)
GFR calc non Af Amer: >60 mL/min (ref >60)
Glucose, Bld: 94 mg/dL (ref 70–99)
Potassium: 3.5 mmol/L (ref 3.5–5.1)
Sodium: 141 mmol/L (ref 135–145)

## 2019-09-12 LAB — CBC WITH DIFFERENTIAL/PLATELET
Abs Immature Granulocytes: 0.04 10*3/uL (ref 0.00–0.07)
Basophils Absolute: 0 10*3/uL (ref 0.0–0.1)
Basophils Relative: 0 %
Eosinophils Absolute: 0.1 10*3/uL (ref 0.0–0.5)
Eosinophils Relative: 1 %
HCT: 34.6 % — ABNORMAL LOW (ref 39.0–52.0)
Hemoglobin: 11 g/dL — ABNORMAL LOW (ref 13.0–17.0)
Immature Granulocytes: 1 %
Lymphocytes Relative: 44 %
Lymphs Abs: 3.3 10*3/uL (ref 0.7–4.0)
MCH: 29.4 pg (ref 26.0–34.0)
MCHC: 31.8 g/dL (ref 30.0–36.0)
MCV: 92.5 fL (ref 80.0–100.0)
Monocytes Absolute: 0.9 10*3/uL (ref 0.1–1.0)
Monocytes Relative: 12 %
Neutro Abs: 3.2 10*3/uL (ref 1.7–7.7)
Neutrophils Relative %: 42 %
Platelets: 311 10*3/uL (ref 150–400)
RBC: 3.74 MIL/uL — ABNORMAL LOW (ref 4.22–5.81)
RDW: 15.5 % (ref 11.5–15.5)
WBC: 7.6 10*3/uL (ref 4.0–10.5)
nRBC: 0 % (ref 0.0–0.2)

## 2019-09-12 LAB — GLUCOSE, CAPILLARY
Glucose-Capillary: 79 mg/dL (ref 70–99)
Glucose-Capillary: 91 mg/dL (ref 70–99)
Glucose-Capillary: 121 mg/dL — ABNORMAL HIGH (ref 70–99)
Glucose-Capillary: 113 mg/dL — ABNORMAL HIGH (ref 70–99)

## 2019-09-12 NOTE — Progress Notes (Signed)
pts HR while sleeping is in the 40"s with RR 10-14. Awakens easily, denies any needs & denied feeling different , will monitor.

## 2019-09-12 NOTE — Progress Notes (Signed)
Pharmacy Antibiotic Note  Brandon [name] is a 69 y.o. male with hx MDS status post stem cell transplant admitted on 09/08/2019 with N/V. He's currently on cefepime for empiric coverage.  Today, 09/12/2019: - day #4 cefepime - afeb, wbc wnl - scr stable - all cultures have been negative thus far  Plan: -Cefepime 2 g IV q8h -Follow renal function and culture data - f/u with plan/LOT for abx ____________________________________  Height: 5\' 10"  (177.8 cm) Weight: 233 lb 4 oz (105.8 kg) IBW/kg (Calculated) : 73  Temp (24hrs), Avg:98.2 F (36.8 C), Min:97.9 F (36.6 C), Max:98.6 F (37 C)  Recent Labs  Lab 09/06/19 0644 09/07/19 0820 09/08/19 1935 09/09/19 0613 09/10/19 0558 09/11/19 0522 09/12/19 0525  WBC 7.3   < > 21.9* 15.8* 8.4 7.3 7.6  CREATININE 1.06   < > 1.36* 1.39* 1.07 1.01 1.01  LATICACIDVEN 0.9  --   --   --   --   --   --    < > = values in this interval not displayed.    Estimated Creatinine Clearance: 82.9 mL/min (by C-G formula based on SCr of 1.01 mg/dL).    Allergies  Allergen Reactions  . Tape Other (See Comments)    Skin irritation that may cause a scab if left on too long.     Antimicrobials this admission: cefepime 3/17 >>   Dose adjustments this admission: --  Microbiology results: 3/17 BCx: Barrie Folk 09/12/2019 11:53 AM

## 2019-09-12 NOTE — Progress Notes (Signed)
PROGRESS NOTE  Brandon [name] [id number] DOB: 18-May-1951 DOA: 09/08/2019 PCP: [name], MD  HPI/Recap of past 24 hours: HPI from Dr [name] is a 69 y.o. 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 Denies any abdominal pain or diarrhea. Pt had significant work-up and evaluation, was seen by both oncologist as well as infectious disease. Pts endocrinologist and transplant physician at [facility] were also involved. Pt was discharged home on 20 mg of oral hydrocortisone twice a day.  On arrival home he started having nausea and vomiting and abdominal discomfort.  This is typical of his usual adrenal crisis.  He is becoming more dehydrated and feels weak.  He came back to the ER where he is being readmitted with another adrenal crisis. In the ED, labs fairly stable, other work-up essentially within normal.  Patient is being readmitted with recurrent adrenal crisis.      Today, patient still noted to be bradycardic while sleeping at night.  Reported some mild nausea requiring Zofran this a.m.  Denies any other new complaints.      Assessment/Plan: Principal Problem:   Adrenal crisis (HCC) Active Problems:   Hyperlipidemia   GERD (gastroesophageal reflux disease)   History of myelodysplastic syndrome   Adrenocortical insufficiency (HCC)   History of DVT (deep vein thrombosis)  Intractable nausea/vomiting likely 2/2 adrenal crisis of unclear etiology Adrenal crisis likely 2/2 tapering of steroids Currently afebrile, with leukocytosis (on steroids) CT chest/abdomen/pelvis done 3/13 with no acute findings except for possible cystitis of which ID recommended no further  antibiotics Currently no signs of active infective process, seen by oncology during previous admission, reviewed peripheral smear no evidence of recurrent MDS or leukemia Switch to PO cortef 30 mg in am, 20 mg in noon Continue antiemetics as needed Discussed with oncologist Dr. [name] [phone]) on 09/09/2019, recommended starting empiric antibiotics and continue further management  Discussed with patient endocrinologist at [facility], Dr [name] [phone] or [phone]), recommended discharging patient on 30 mg in am, 20 mg in pm  AKI Resolved Daily BMP  Diabetes mellitus type 2 Steroid-induced Continue SSI, Accu-Cheks, hypoglycemic protocol Hold home Metformin  Bradycardia Asymptomatic Hold home metoprolol  GERD Continue PPI, famotidine  History of CVA Continue aspirin, statins  History of MDS status post stem cell transplant Outpatient follow-up with oncologist  Obesity Lifestyle modification advised       Malnutrition Type:      Malnutrition Characteristics:      Nutrition Interventions:       Estimated body mass index is 33.47 kg/m as calculated from the following:   Height as of this encounter: 5\' 10"  (1.778 m).   Weight as of this encounter: 105.8 kg.     Code Status: Full  Family Communication: Discussed extensively with patient  Disposition Plan: Patient is from home, anticipate discharge to home 09/13/19   Consultants:  None  Procedures:  None  Antimicrobials:  Cefepime  DVT prophylaxis: Lovenox   Objective: Vitals:   09/11/19 2132 09/12/19 0040 09/12/19 1046 09/12/19 1435  BP: 132/90 (!) 142/81  132/72  Pulse: (!) 58 (!) 50  76  Resp: 20 20  16   Temp: 97.9 F (36.6 C) 98.6 F (37 C)  98.4 F (36.9 C)  TempSrc: Oral  Oral  Oral  SpO2: 98% 98% 94% 95%  Weight:      Height:        Intake/Output Summary (Last 24 hours) at 09/12/2019 1733 Last data filed at 09/12/2019 1300 Gross per 24 hour  Intake 600 ml   Output --  Net 600 ml   Filed Weights   09/08/19 1929  Weight: 105.8 kg    Exam:  General: NAD   Cardiovascular: S1, S2 present  Respiratory: CTAB  Abdomen: Soft, nontender, nondistended, bowel sounds present  Musculoskeletal: No bilateral pedal edema noted  Skin: Normal  Psychiatry: Normal mood   Data Reviewed: CBC: Recent Labs  Lab 09/06/19 0644 09/06/19 0644 09/07/19 0820 09/07/19 0820 09/08/19 1935 09/09/19 0613 09/10/19 0558 09/11/19 0522 09/12/19 0525  WBC 7.3   < > 6.8   < > 21.9* 15.8* 8.4 7.3 7.6  NEUTROABS 3.6  --  2.7  --   --   --  5.1 3.8 3.2  HGB 11.4*   < > 11.9*   < > 15.8 12.2* 10.8* 10.4* 11.0*  HCT 36.0*   < > 37.3*   < > 49.1 37.8* 33.8* 32.7* 34.6*  MCV 93.5   < > 92.6   < > 90.4 90.9 92.3 93.2 92.5  PLT 295   < > 339   < > 418* 341 290 295 311   < > = values in this interval not displayed.   Basic Metabolic Panel: Recent Labs  Lab 09/07/19 0820 09/07/19 0820 09/08/19 1935 09/09/19 [id number] 09/10/19 0558 09/11/19 0522 09/12/19 0525  NA 137   < > 137 137 139 140 141  K 3.8   < > 4.2 4.3 4.3 3.9 3.5  CL 107   < > 105 109 113* 112* 110  CO2 23   < > 21* 21* 22 23 25   GLUCOSE 87   < > 144* 149* 137* 118* 94  BUN 12   < > 18 25* 19 17 15   CREATININE 1.00   < > 1.36* 1.39* 1.07 1.01 1.01  CALCIUM 8.2*   < > 9.2 7.9* 7.8* 7.9* 8.3*  MG 1.9  --   --   --   --   --   --    < > = values in this interval not displayed.   GFR: Estimated Creatinine Clearance: 82.9 mL/min (by C-G formula based on SCr of 1.01 mg/dL). Liver Function Tests: Recent Labs  Lab 09/06/19 0644 09/07/19 0820 09/08/19 1935 09/09/19 0613  AST 15 17 30 22   ALT 16 16 28 25   ALKPHOS 67 71 141* 93  BILITOT 0.7 0.5 0.7 0.6  PROT 5.4* 5.7* 7.3 5.7*  ALBUMIN 2.8* 3.0* 3.9 3.0*   Recent Labs  Lab 09/08/19 1935  LIPASE 23   No results for input(s): AMMONIA in the last 168 hours. Coagulation Profile: No results for input(s): INR, PROTIME in the last 168  hours. Cardiac Enzymes: No results for input(s): CKTOTAL, CKMB, CKMBINDEX, TROPONINI in the last 168 hours. BNP (last 3 results) No results for input(s): PROBNP in the last 8760 hours. HbA1C: No results for input(s): HGBA1C in the last 72 hours. CBG: Recent Labs  Lab 09/11/19 1653 09/11/19 2134 09/12/19 0752 09/12/19 1214 09/12/19 1628  GLUCAP 149* 140* 79 91 121*   Lipid Profile: No results for input(s): CHOL, HDL, LDLCALC, TRIG, CHOLHDL, LDLDIRECT in the last 72 hours. Thyroid Function Tests: No results for input(s): TSH, T4TOTAL, FREET4, T3FREE, THYROIDAB in the last 72 hours.  Anemia Panel: No results for input(s): VITAMINB12, FOLATE, FERRITIN, TIBC, IRON, RETICCTPCT in the last 72 hours. Urine analysis:    Component Value Date/Time   COLORURINE YELLOW 09/08/2019 2120   APPEARANCEUR HAZY (A) 09/08/2019 2120   LABSPEC 1.011 09/08/2019 2120   PHURINE 5.0 09/08/2019 2120   GLUCOSEU NEGATIVE 09/08/2019 2120   GLUCOSEU NEGATIVE 12/23/2009 0742   HGBUR NEGATIVE 09/08/2019 2120   HGBUR negative 01/24/2007 0810   BILIRUBINUR NEGATIVE 09/08/2019 2120   BILIRUBINUR negative 08/10/2014 1057   BILIRUBINUR n 02/11/2013 1059   KETONESUR NEGATIVE 09/08/2019 2120   PROTEINUR NEGATIVE 09/08/2019 2120   UROBILINOGEN 0.2 08/10/2014 1057   UROBILINOGEN 0.2 12/23/2009 0742   NITRITE NEGATIVE 09/08/2019 2120   LEUKOCYTESUR SMALL (A) 09/08/2019 2120   Sepsis Labs: @LABRCNTIP (procalcitonin:4,lacticidven:4)  ) Recent Results (from the past 240 hour(s))  SARS CORONAVIRUS 2 (TAT 6-24 HRS) Nasopharyngeal Nasopharyngeal Swab     Status: None   Collection Time: 09/03/19 10:07 PM   Specimen: Nasopharyngeal Swab  Result Value Ref Range Status   SARS Coronavirus 2 NEGATIVE NEGATIVE Final    Comment: (NOTE) SARS-CoV-2 target nucleic acids are NOT DETECTED. The SARS-CoV-2 RNA is generally detectable in upper and lower respiratory specimens during the acute phase of infection.  Negative results do not preclude SARS-CoV-2 infection, do not rule out co-infections with other pathogens, and should not be used as the sole basis for treatment or other patient management decisions. Negative results must be combined with clinical observations, patient history, and epidemiological information. The expected result is Negative. Fact Sheet for Patients: SugarRoll.be Fact Sheet for Healthcare Providers: https://www.woods-mathews.com/ This test is not yet approved or cleared by the [place] FDA and  has been authorized for detection and/or diagnosis of SARS-CoV-2 by FDA under an Emergency Use Authorization (EUA). This EUA will remain  in effect (meaning this test can be used) for the duration of the COVID-19 declaration under Section 56 4(b)(1) of the Act, 21 U.S.C. section 360bbb-3(b)(1), unless the authorization is terminated or revoked sooner. Performed at [facility], [place] [address] [place], [place] [address]   Culture, blood (routine x 2)     Status: None   Collection Time: 09/04/19  6:23 AM   Specimen: BLOOD  Result Value Ref Range Status   Specimen Description   Final    BLOOD LEFT ANTECUBITAL Performed at [facility] [address], [place], [place] [address]    Special Requests   Final    BOTTLES DRAWN AEROBIC AND ANAEROBIC Blood Culture adequate volume Performed at [facility] [address] [place], [place] [address]    Culture   Final    NO GROWTH 5 DAYS Performed at [facility], [place] [address] [place], [place] [address]    Report Status 09/09/2019 FINAL  Final  Culture, blood (routine x 2)     Status: None   Collection Time: 09/04/19  6:24 AM   Specimen: BLOOD LEFT ARM  Result Value Ref Range Status   Specimen Description   Final    BLOOD LEFT ARM Performed at [facility] [address], [place], [place] [address]     Special Requests   Final    BOTTLES DRAWN AEROBIC AND ANAEROBIC Blood Culture adequate volume Performed at [facility] [address], [place], [place] [address]    Culture   Final    NO GROWTH 5 DAYS Performed at [facility], [place]  [address], [place], [place] [address]    Report Status 09/09/2019 FINAL  Final  Culture, Urine     Status: None   Collection Time: 09/06/19  7:49 AM   Specimen: Urine, Random  Result Value Ref Range Status   Specimen Description   Final    URINE, RANDOM Performed at [place] [address] [place], [place] [address]    Special Requests   Final    Immunocompromised Performed at [facility], [place] [address], [place], [place] [address]    Culture   Final    NO GROWTH Performed at [facility], [place] [address] [place], [place] [address]    Report Status 09/07/2019 FINAL  Final  SARS CORONAVIRUS 2 (TAT 6-24 HRS) Nasopharyngeal Nasopharyngeal Swab     Status: None   Collection Time: 09/09/19  1:09 AM   Specimen: Nasopharyngeal Swab  Result Value Ref Range Status   SARS Coronavirus 2 NEGATIVE NEGATIVE Final    Comment: (NOTE) SARS-CoV-2 target nucleic acids are NOT DETECTED. The SARS-CoV-2 RNA is generally detectable in upper and lower respiratory specimens during the acute phase of infection. Negative results do not preclude SARS-CoV-2 infection, do not rule out co-infections with other pathogens, and should not be used as the sole basis for treatment or other patient management decisions. Negative results must be combined with clinical observations, patient history, and epidemiological information. The expected result is Negative. Fact Sheet for Patients: SugarRoll.be Fact Sheet for Healthcare Providers: https://www.woods-mathews.com/ This test is not yet approved or cleared by the [place] FDA and  has been authorized for  detection and/or diagnosis of SARS-CoV-2 by FDA under an Emergency Use Authorization (EUA). This EUA will remain  in effect (meaning this test can be used) for the duration of the COVID-19 declaration under Section 56 4(b)(1) of the Act, 21 U.S.C. section 360bbb-3(b)(1), unless the authorization is terminated or revoked sooner. Performed at [facility], [place] [address] [place], [place] [address]   Culture, blood (routine x 2)     Status: None (Preliminary result)   Collection Time: 09/09/19 11:45 AM   Specimen: BLOOD  Result Value Ref Range Status   Specimen Description   Final    BLOOD LEFT ANTECUBITAL Performed at [facility] [address], [place], [place] [address]    Special Requests   Final    BOTTLES DRAWN AEROBIC ONLY Blood Culture adequate volume Performed at [facility] [address] [place], [place] [address]    Culture   Final    NO GROWTH 3 DAYS Performed at [facility], [place] [address], [place], [place] [address]    Report Status PENDING  Incomplete  Culture, blood (routine x 2)     Status: None (Preliminary result)   Collection Time: 09/09/19 11:45 AM   Specimen: BLOOD  Result Value Ref Range Status   Specimen Description   Final    BLOOD LEFT ANTECUBITAL Performed at [facility] [address] [place], [place] [address]    Special Requests   Final    BOTTLES DRAWN AEROBIC ONLY Blood Culture adequate volume Performed at [facility] [address] [place], [place] [address]    Culture   Final    NO GROWTH 3 DAYS Performed at [facility], [place] [address], [place],  [address]    Report Status PENDING  Incomplete      Studies: No  results found.  Scheduled Meds: . acyclovir  400 mg Oral BID  . aspirin EC  325 mg Oral QHS  . budesonide (PULMICORT) nebulizer solution  0.5 mg Nebulization BID  . cholecalciferol  2,000 Units Oral QHS  .  enoxaparin (LOVENOX) injection  40 mg Subcutaneous Q24H  . famotidine  20 mg Oral Daily  . guaiFENesin  600 mg Oral Daily  . hydrocortisone  20 mg Oral QPC lunch  . hydrocortisone  30 mg Oral Q breakfast  . insulin aspart  0-20 Units Subcutaneous TID WC  . insulin aspart  0-5 Units Subcutaneous QHS  . loratadine  10 mg Oral Daily  . magnesium oxide  400 mg Oral Daily  . montelukast  10 mg Oral Daily  . pantoprazole  40 mg Oral Daily  . rosuvastatin  10 mg Oral QHS  . senna-docusate  2 tablet Oral QHS  . sodium chloride flush  3 mL Intravenous Once    Continuous Infusions: . ceFEPime (MAXIPIME) IV 2 g (09/12/19 1507)     LOS: 4 days     Alma Friendly, MD [facility]  If 7PM-7AM, please contact night-coverage [email or web address] 09/12/2019, 5:33 PM

## 2019-09-13 LAB — CBC WITH DIFFERENTIAL/PLATELET
Abs Immature Granulocytes: 0.03 10*3/uL (ref 0.00–0.07)
Basophils Absolute: 0 10*3/uL (ref 0.0–0.1)
Basophils Relative: 1 %
Eosinophils Absolute: 0.1 10*3/uL (ref 0.0–0.5)
Eosinophils Relative: 2 %
HCT: 36.3 % — ABNORMAL LOW (ref 39.0–52.0)
Hemoglobin: 11.7 g/dL — ABNORMAL LOW (ref 13.0–17.0)
Immature Granulocytes: 0 %
Lymphocytes Relative: 45 %
Lymphs Abs: 3 10*3/uL (ref 0.7–4.0)
MCH: 29.7 pg (ref 26.0–34.0)
MCHC: 32.2 g/dL (ref 30.0–36.0)
MCV: 92.1 fL (ref 80.0–100.0)
Monocytes Absolute: 0.7 10*3/uL (ref 0.1–1.0)
Monocytes Relative: 11 %
Neutro Abs: 2.8 10*3/uL (ref 1.7–7.7)
Neutrophils Relative %: 41 %
Platelets: 315 10*3/uL (ref 150–400)
RBC: 3.94 MIL/uL — ABNORMAL LOW (ref 4.22–5.81)
RDW: 15.4 % (ref 11.5–15.5)
WBC: 6.7 10*3/uL (ref 4.0–10.5)
nRBC: 0 % (ref 0.0–0.2)

## 2019-09-13 LAB — BASIC METABOLIC PANEL
Anion gap: 8 (ref 5–15)
BUN: 13 mg/dL (ref 8–23)
CO2: 23 mmol/L (ref 22–32)
Calcium: 8.3 mg/dL — ABNORMAL LOW (ref 8.9–10.3)
Chloride: 109 mmol/L (ref 98–111)
Creatinine, Ser: 1.03 mg/dL (ref 0.61–1.24)
GFR calc Af Amer: >60 mL/min (ref >60)
GFR calc non Af Amer: >60 mL/min (ref >60)
Glucose, Bld: 80 mg/dL (ref 70–99)
Potassium: 3.4 mmol/L — ABNORMAL LOW (ref 3.5–5.1)
Sodium: 140 mmol/L (ref 135–145)

## 2019-09-13 LAB — GLUCOSE, CAPILLARY
Glucose-Capillary: 76 mg/dL (ref 70–99)
Glucose-Capillary: 92 mg/dL (ref 70–99)
Glucose-Capillary: 130 mg/dL — ABNORMAL HIGH (ref 70–99)
Glucose-Capillary: 139 mg/dL — ABNORMAL HIGH (ref 70–99)

## 2019-09-13 NOTE — Progress Notes (Signed)
PROGRESS NOTE  Brandon [name] [id number] DOB: 23-Sep-1950 DOA: 09/08/2019 PCP: [name], MD  HPI/Recap of past 24 hours: HPI from Dr [name] is a 69 y.o. 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 Denies any abdominal pain or diarrhea. Pt had significant work-up and evaluation, was seen by both oncologist as well as infectious disease. Pts endocrinologist and transplant physician at [facility] were also involved. Pt was discharged home on 20 mg of oral hydrocortisone twice a day.  On arrival home he started having nausea and vomiting and abdominal discomfort.  This is typical of his usual adrenal crisis.  He is becoming more dehydrated and feels weak.  He came back to the ER where he is being readmitted with another adrenal crisis. In the ED, labs fairly stable, other work-up essentially within normal.  Patient is being readmitted with recurrent adrenal crisis.      Today, patient still with some mild nausea, otherwise denies any new complaints.      Assessment/Plan: Principal Problem:   Adrenal crisis (HCC) Active Problems:   Hyperlipidemia   GERD (gastroesophageal reflux disease)   History of myelodysplastic syndrome   Adrenocortical insufficiency (HCC)   History of DVT (deep vein thrombosis)  Intractable nausea/vomiting likely 2/2 adrenal crisis of unclear etiology Adrenal crisis likely 2/2 tapering of steroids Currently afebrile, with leukocytosis (on steroids) CT chest/abdomen/pelvis done 3/13 with no acute findings except for possible cystitis of which ID recommended no further antibiotics Currently no signs of active infective process, seen by oncology during  previous admission, reviewed peripheral smear no evidence of recurrent MDS or leukemia Switch to PO cortef 30 mg in am, 20 mg in noon Continue antiemetics as needed Discussed with oncologist Dr. [name] [phone]) on 09/09/2019, recommended starting empiric antibiotics and continue further management  Discussed with patient endocrinologist at [facility], Dr [name] [phone] or [phone]), recommended discharging patient on 30 mg in am, 20 mg in pm  AKI Resolved Daily BMP  Diabetes mellitus type 2 Steroid-induced Continue SSI, Accu-Cheks, hypoglycemic protocol Hold home Metformin  Bradycardia Asymptomatic, heart rate in the 50s Telemetry leads to be replaced, with better heart rate readings Hold home metoprolol  GERD Continue PPI, famotidine  History of CVA Continue aspirin, statins  History of MDS status post stem cell transplant Outpatient follow-up with oncologist  Obesity Lifestyle modification advised       Malnutrition Type:      Malnutrition Characteristics:      Nutrition Interventions:       Estimated body mass index is 33.47 kg/m as calculated from the following:   Height as of this encounter: 5\' 10"  (1.778 m).   Weight as of this encounter: 105.8 kg.     Code Status: Full  Family Communication: Discussed extensively with patient  Disposition Plan: Patient is from home, anticipate discharge to home 09/14/19   Consultants:  None  Procedures:  None  Antimicrobials:  Cefepime  DVT prophylaxis: Lovenox   Objective: Vitals:   09/12/19 2049 09/13/19 0601 09/13/19 0810 09/13/19 1401  BP: 133/88 139/88  132/86  Pulse: (!) 59 (!) 50  (!) 56  Resp: 20 20  16   Temp: 98.1 F (36.7 C) 98 F (36.7 C)  97.8 F (36.6 C)  TempSrc:  Oral Oral  Oral  SpO2: 96% 95% 95% 97%  Weight:      Height:        Intake/Output Summary (Last 24 hours) at 09/13/2019 1523 Last data filed at 09/13/2019 0900 Gross per 24 hour  Intake 200  ml  Output --  Net 200 ml   Filed Weights   09/08/19 1929  Weight: 105.8 kg    Exam:  General: NAD   Cardiovascular: S1, S2 present  Respiratory: CTAB  Abdomen: Soft, nontender, nondistended, bowel sounds present  Musculoskeletal: No bilateral pedal edema noted  Skin: Normal  Psychiatry: Normal mood   Data Reviewed: CBC: Recent Labs  Lab 09/07/19 0820 09/08/19 1935 09/09/19 [id number] 09/10/19 0558 09/11/19 0522 09/12/19 0525 09/13/19 0537  WBC 6.8   < > 15.8* 8.4 7.3 7.6 6.7  NEUTROABS 2.7  --   --  5.1 3.8 3.2 2.8  HGB 11.9*   < > 12.2* 10.8* 10.4* 11.0* 11.7*  HCT 37.3*   < > 37.8* 33.8* 32.7* 34.6* 36.3*  MCV 92.6   < > 90.9 92.3 93.2 92.5 92.1  PLT 339   < > 341 290 295 311 315   < > = values in this interval not displayed.   Basic Metabolic Panel: Recent Labs  Lab 09/07/19 0820 09/08/19 1935 09/09/19 [id number] 09/10/19 0558 09/11/19 0522 09/12/19 0525 09/13/19 0537  NA 137   < > 137 139 140 141 140  K 3.8   < > 4.3 4.3 3.9 3.5 3.4*  CL 107   < > 109 113* 112* 110 109  CO2 23   < > 21* 22 23 25 23   GLUCOSE 87   < > 149* 137* 118* 94 80  BUN 12   < > 25* 19 17 15 13   CREATININE 1.00   < > 1.39* 1.07 1.01 1.01 1.03  CALCIUM 8.2*   < > 7.9* 7.8* 7.9* 8.3* 8.3*  MG 1.9  --   --   --   --   --   --    < > = values in this interval not displayed.   GFR: Estimated Creatinine Clearance: 81.3 mL/min (by C-G formula based on SCr of 1.03 mg/dL). Liver Function Tests: Recent Labs  Lab 09/07/19 0820 09/08/19 1935 09/09/19 0613  AST 17 30 22   ALT 16 28 25   ALKPHOS 71 141* 93  BILITOT 0.5 0.7 0.6  PROT 5.7* 7.3 5.7*  ALBUMIN 3.0* 3.9 3.0*   Recent Labs  Lab 09/08/19 1935  LIPASE 23   No results for input(s): AMMONIA in the last 168 hours. Coagulation Profile: No results for input(s): INR, PROTIME in the last 168 hours. Cardiac Enzymes: No results for input(s): CKTOTAL, CKMB, CKMBINDEX, TROPONINI in the last 168 hours. BNP (last 3 results) No  results for input(s): PROBNP in the last 8760 hours. HbA1C: No results for input(s): HGBA1C in the last 72 hours. CBG: Recent Labs  Lab 09/12/19 1214 09/12/19 1628 09/12/19 2114 09/13/19 0741 09/13/19 1242  GLUCAP 91 121* 113* 76 92   Lipid Profile: No results for input(s): CHOL, HDL, LDLCALC, TRIG, CHOLHDL, LDLDIRECT in the last 72 hours. Thyroid Function Tests: No results for input(s): TSH, T4TOTAL, FREET4, T3FREE, THYROIDAB in the last 72 hours. Anemia Panel: No results for input(s): VITAMINB12, FOLATE, FERRITIN, TIBC, IRON, RETICCTPCT in the last 72 hours. Urine analysis:    Component Value Date/Time   COLORURINE YELLOW 09/08/2019 2120   APPEARANCEUR HAZY (A) 09/08/2019 2120   LABSPEC 1.011  09/08/2019 2120   PHURINE 5.0 09/08/2019 2120   GLUCOSEU NEGATIVE 09/08/2019 2120   GLUCOSEU NEGATIVE 12/23/2009 0742   HGBUR NEGATIVE 09/08/2019 2120   HGBUR negative 01/24/2007 0810   BILIRUBINUR NEGATIVE 09/08/2019 2120   BILIRUBINUR negative 08/10/2014 1057   BILIRUBINUR n 02/11/2013 1059   KETONESUR NEGATIVE 09/08/2019 2120   PROTEINUR NEGATIVE 09/08/2019 2120   UROBILINOGEN 0.2 08/10/2014 1057   UROBILINOGEN 0.2 12/23/2009 0742   NITRITE NEGATIVE 09/08/2019 2120   LEUKOCYTESUR SMALL (A) 09/08/2019 2120   Sepsis Labs: @LABRCNTIP (procalcitonin:4,lacticidven:4)  ) Recent Results (from the past 240 hour(s))  SARS CORONAVIRUS 2 (TAT 6-24 HRS) Nasopharyngeal Nasopharyngeal Swab     Status: None   Collection Time: 09/03/19 10:07 PM   Specimen: Nasopharyngeal Swab  Result Value Ref Range Status   SARS Coronavirus 2 NEGATIVE NEGATIVE Final    Comment: (NOTE) SARS-CoV-2 target nucleic acids are NOT DETECTED. The SARS-CoV-2 RNA is generally detectable in upper and lower respiratory specimens during the acute phase of infection. Negative results do not preclude SARS-CoV-2 infection, do not rule out co-infections with other pathogens, and should not be used as the sole basis  for treatment or other patient management decisions. Negative results must be combined with clinical observations, patient history, and epidemiological information. The expected result is Negative. Fact Sheet for Patients: SugarRoll.be Fact Sheet for Healthcare Providers: https://www.woods-mathews.com/ This test is not yet approved or cleared by the [place] FDA and  has been authorized for detection and/or diagnosis of SARS-CoV-2 by FDA under an Emergency Use Authorization (EUA). This EUA will remain  in effect (meaning this test can be used) for the duration of the COVID-19 declaration under Section 56 4(b)(1) of the Act, 21 U.S.C. section 360bbb-3(b)(1), unless the authorization is terminated or revoked sooner. Performed at [facility], [place] [address] [place], [place] [address]   Culture, blood (routine x 2)     Status: None   Collection Time: 09/04/19  6:23 AM   Specimen: BLOOD  Result Value Ref Range Status   Specimen Description   Final    BLOOD LEFT ANTECUBITAL Performed at [facility] [address], [place], [place] [address]    Special Requests   Final    BOTTLES DRAWN AEROBIC AND ANAEROBIC Blood Culture adequate volume Performed at [facility] [address] [place], [place] [address]    Culture   Final    NO GROWTH 5 DAYS Performed at [facility], [place] [address] [place], [place] [address]    Report Status 09/09/2019 FINAL  Final  Culture, blood (routine x 2)     Status: None   Collection Time: 09/04/19  6:24 AM   Specimen: BLOOD LEFT ARM  Result Value Ref Range Status   Specimen Description   Final    BLOOD LEFT ARM Performed at [facility] [address], [place], [place] [address]    Special Requests   Final    BOTTLES DRAWN AEROBIC AND ANAEROBIC Blood Culture adequate volume Performed at [facility]  [address], [place], [place] [address]    Culture   Final    NO GROWTH 5 DAYS Performed at [facility], [place] [address], [place], [place] [address]    Report Status 09/09/2019 FINAL  Final  Culture, Urine     Status: None   Collection Time: 09/06/19  7:49 AM   Specimen: Urine, Random  Result Value Ref Range Status  Specimen Description   Final    URINE, RANDOM Performed at [facility], [place] [address], [place], [place] [address]    Special Requests   Final    Immunocompromised Performed at [facility], [place] [address], [place], [place] [address]    Culture   Final    NO GROWTH Performed at [facility], [place] [address], [place], [place] [address]    Report Status 09/07/2019 FINAL  Final  SARS CORONAVIRUS 2 (TAT 6-24 HRS) Nasopharyngeal Nasopharyngeal Swab     Status: None   Collection Time: 09/09/19  1:09 AM   Specimen: Nasopharyngeal Swab  Result Value Ref Range Status   SARS Coronavirus 2 NEGATIVE NEGATIVE Final    Comment: (NOTE) SARS-CoV-2 target nucleic acids are NOT DETECTED. The SARS-CoV-2 RNA is generally detectable in upper and lower respiratory specimens during the acute phase of infection. Negative results do not preclude SARS-CoV-2 infection, do not rule out co-infections with other pathogens, and should not be used as the sole basis for treatment or other patient management decisions. Negative results must be combined with clinical observations, patient history, and epidemiological information. The expected result is Negative. Fact Sheet for Patients: SugarRoll.be Fact Sheet for Healthcare Providers: https://www.woods-mathews.com/ This test is not yet approved or cleared by the [place] FDA and  has been authorized for detection and/or diagnosis of SARS-CoV-2 by FDA under an Emergency Use Authorization (EUA). This EUA will remain  in effect (meaning this test can be  used) for the duration of the COVID-19 declaration under Section 56 4(b)(1) of the Act, 21 U.S.C. section 360bbb-3(b)(1), unless the authorization is terminated or revoked sooner. Performed at [facility], [place] [address] [place], [place] [address]   Culture, blood (routine x 2)     Status: None (Preliminary result)   Collection Time: 09/09/19 11:45 AM   Specimen: BLOOD  Result Value Ref Range Status   Specimen Description   Final    BLOOD LEFT ANTECUBITAL Performed at [facility] [address], [place], [place] [address]    Special Requests   Final    BOTTLES DRAWN AEROBIC ONLY Blood Culture adequate volume Performed at [facility] [address] [place], [place] [address]    Culture   Final    NO GROWTH 4 DAYS Performed at [facility], [place] [address] [place], [place] [address]    Report Status PENDING  Incomplete  Culture, blood (routine x 2)     Status: None (Preliminary result)   Collection Time: 09/09/19 11:45 AM   Specimen: BLOOD  Result Value Ref Range Status   Specimen Description   Final    BLOOD LEFT ANTECUBITAL Performed at [facility] [address] [place], [place] [address]    Special Requests   Final    BOTTLES DRAWN AEROBIC ONLY Blood Culture adequate volume Performed at [facility] [address], [place], [place] [address]    Culture   Final    NO GROWTH 4 DAYS Performed at [facility], [place] [address] [place], [place] [address]    Report Status PENDING  Incomplete      Studies: No results found.  Scheduled Meds: . acyclovir  400 mg Oral BID  . aspirin EC  325 mg Oral QHS  . budesonide (PULMICORT) nebulizer solution  0.5 mg Nebulization BID  . cholecalciferol  2,000 Units Oral QHS  . enoxaparin (LOVENOX)  injection  40 mg Subcutaneous Q24H  . famotidine  20 mg Oral Daily  . guaiFENesin  600 mg Oral Daily  . hydrocortisone  20 mg Oral QPC  lunch  . hydrocortisone  30 mg Oral Q breakfast  . insulin aspart  0-20 Units Subcutaneous TID WC  . insulin aspart  0-5 Units Subcutaneous QHS  . loratadine  10 mg Oral Daily  . magnesium oxide  400 mg Oral Daily  . montelukast  10 mg Oral Daily  . pantoprazole  40 mg Oral Daily  . rosuvastatin  10 mg Oral QHS  . senna-docusate  2 tablet Oral QHS  . sodium chloride flush  3 mL Intravenous Once    Continuous Infusions: . ceFEPime (MAXIPIME) IV 2 g (09/13/19 1351)     LOS: 5 days     Alma Friendly, MD [facility]  If 7PM-7AM, please contact night-coverage [email or web address] 09/13/2019, 3:23 PM

## 2019-09-14 LAB — CBC WITH DIFFERENTIAL/PLATELET
Abs Immature Granulocytes: 0.05 10*3/uL (ref 0.00–0.07)
Basophils Absolute: 0 10*3/uL (ref 0.0–0.1)
Basophils Relative: 1 %
Eosinophils Absolute: 0.2 10*3/uL (ref 0.0–0.5)
Eosinophils Relative: 2 %
HCT: 39.3 % (ref 39.0–52.0)
Hemoglobin: 12.9 g/dL — ABNORMAL LOW (ref 13.0–17.0)
Immature Granulocytes: 1 %
Lymphocytes Relative: 44 %
Lymphs Abs: 3.3 10*3/uL (ref 0.7–4.0)
MCH: 29.8 pg (ref 26.0–34.0)
MCHC: 32.8 g/dL (ref 30.0–36.0)
MCV: 90.8 fL (ref 80.0–100.0)
Monocytes Absolute: 0.8 10*3/uL (ref 0.1–1.0)
Monocytes Relative: 11 %
Neutro Abs: 3 10*3/uL (ref 1.7–7.7)
Neutrophils Relative %: 41 %
Platelets: 352 10*3/uL (ref 150–400)
RBC: 4.33 MIL/uL (ref 4.22–5.81)
RDW: 15.3 % (ref 11.5–15.5)
WBC: 7.3 10*3/uL (ref 4.0–10.5)
nRBC: 0 % (ref 0.0–0.2)

## 2019-09-14 LAB — BASIC METABOLIC PANEL
Anion gap: 8 (ref 5–15)
BUN: 12 mg/dL (ref 8–23)
CO2: 24 mmol/L (ref 22–32)
Calcium: 8.3 mg/dL — ABNORMAL LOW (ref 8.9–10.3)
Chloride: 108 mmol/L (ref 98–111)
Creatinine, Ser: 0.99 mg/dL (ref 0.61–1.24)
GFR calc Af Amer: >60 mL/min (ref >60)
GFR calc non Af Amer: >60 mL/min (ref >60)
Glucose, Bld: 89 mg/dL (ref 70–99)
Potassium: 3.4 mmol/L — ABNORMAL LOW (ref 3.5–5.1)
Sodium: 140 mmol/L (ref 135–145)

## 2019-09-14 LAB — CULTURE, BLOOD (ROUTINE X 2)
Culture: NO GROWTH
Culture: NO GROWTH
Special Requests: ADEQUATE
Special Requests: ADEQUATE

## 2019-09-14 LAB — GLUCOSE, CAPILLARY
Glucose-Capillary: 71 mg/dL (ref 70–99)
Glucose-Capillary: 115 mg/dL — ABNORMAL HIGH (ref 70–99)

## 2019-09-14 MED ORDER — HYDROCORTISONE 20 MG PO TABS: 20.0000 mg | ORAL_TABLET | Freq: Every day | ORAL | 0 refills | Status: AC

## 2019-09-14 MED ORDER — POTASSIUM CHLORIDE CRYS ER 20 MEQ PO TBCR
40.0000 meq | EXTENDED_RELEASE_TABLET | Freq: Once | ORAL | Status: AC
  Administered 2019-09-14: 40 meq via ORAL
  Filled 2019-09-14: qty 2

## 2019-09-14 MED ORDER — CEFDINIR 300 MG PO CAPS: 300.0000 mg | ORAL_CAPSULE | Freq: Two times a day (BID) | ORAL | 0 refills | Status: AC

## 2019-09-14 MED ORDER — HYDROCORTISONE 10 MG PO TABS: 30.0000 mg | ORAL_TABLET | Freq: Every day | ORAL | 0 refills | Status: AC

## 2019-09-14 NOTE — Discharge Summary (Signed)
Discharge Summary  Brandon [name] [id number] DOB: 12/17/1950  PCP: [name], MD  Admit date: 09/08/2019 Discharge date: 09/14/2019  Time spent: 40 mins  Recommendations for Outpatient Follow-up:  1. PCP in 1 week 2. Follow-up with transplant surgeon at [facility] as scheduled 3. Follow-up with endocrinologist at [facility] as scheduled   Discharge Diagnoses:  Active Hospital Problems   Diagnosis Date Noted  . Adrenal crisis ([place]) 11/05/2018  . History of DVT (deep vein thrombosis) 09/25/2018  . Adrenocortical insufficiency ([place]) 08/06/2017  . History of myelodysplastic syndrome 12/06/2015  . GERD (gastroesophageal reflux disease) 05/03/2014  . Hyperlipidemia 02/04/2007    Resolved Hospital Problems  No resolved problems to display.    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 Denies any abdominal pain or diarrhea. Pt had significant work-up and evaluation, was seen by both oncologist as well as infectious disease. Pts endocrinologist and transplant physician at [facility] were also involved. Pt was discharged home on 20 mg of oral hydrocortisone twice a day. On arrival home he started having nausea and vomiting and abdominal discomfort. This is typical of his usual adrenal crisis. He is becoming more dehydrated and feels weak. He came back to the ER where he is being readmitted with another adrenal crisis. In the ED, labs fairly  stable, other work-up essentially within normal. Patient is being readmitted with recurrent adrenal crisis.    Today, patient reports feeling better, denies any further nausea or vomiting, denies any diarrhea, chest pain, abdominal pain, fever/chills.  Discussed extensively with patient about follow-up with his doctors at [facility], who primarily manage his condition.  Advised that if this reoccurs, patient will be better off at [facility] where his specialists are.     Hospital Course:  Principal Problem:   Adrenal crisis [facility]) Active Problems:   Hyperlipidemia   GERD (gastroesophageal reflux disease)   History of myelodysplastic syndrome   Adrenocortical insufficiency (HCC)   History of DVT (deep vein thrombosis)   Intractable nausea/vomiting likely 2/2 adrenal crisis of unclear etiology Adrenal crisis likely 2/2 tapering of steroids vs unknown etiology Currently afebrile, with leukocytosis (on steroids) CT chest/abdomen/pelvis done 3/13 with no acute findings except for possible cystitis of which ID recommended no further antibiotics Currently no signs of active infective process, seen by oncology during previous admission, reviewed peripheral smear no evidence of recurrent MDS or leukemia Switch to PO cortef 30 mg in am, 20 mg in noon Continue antiemetics as needed Discussed with oncologist Dr. [name] [phone]) on 09/09/2019, recommended starting empiric antibiotics and continue further management  Patient received 6 days of IV cefepime empirically, discharged on 1 more day of p.o. cefdinir for a total of 7 days of antibiotics Discussed with patient endocrinologist at [facility], Dr [name] [phone] or [phone]), recommended discharging patient on 30 mg in am, 20 mg in pm Possibility of patient not absorbing steroids versus graft-versus-host disease in the GI tract, patient preferred to follow-up with GI at  [facility].  AKI Resolved Daily BMP  Diabetes mellitus type  2 Steroid-induced Continue home Metformin  Bradycardia Resolved Asymptomatic, heart rate in the 50s Telemetry leads to be replaced, with better heart rate readings Resume home metoprolol  GERD Continue PPI, famotidine  History of CVA Continue aspirin, statins  History of MDS status post stem cell transplant Outpatient follow-up with oncologist  Obesity Lifestyle modification advised         Malnutrition Type:      Malnutrition Characteristics:      Nutrition Interventions:      Estimated body mass index is 33.47 kg/m as calculated from the following:   Height as of this encounter: 5\' 10"  (1.778 m).   Weight as of this encounter: 105.8 kg.    Procedures:  None  Consultations:  None  Discharge Exam: BP 133/80 (BP Location: Right Arm)   Pulse (!) 52   Temp 98.6 F (37 C) (Oral)   Resp 19   Ht 5\' 10"  (1.778 m)   Wt 105.8 kg   SpO2 95%   BMI 33.47 kg/m   General: NAD Cardiovascular: S1, S2 present Respiratory: CTA B  Discharge Instructions You were cared for by a hospitalist during your hospital stay. If you have any questions about your discharge medications or the care you received while you were in the hospital after you are discharged, you can call the unit and asked to speak with the hospitalist on call if the hospitalist that took care of you is not available. Once you are discharged, your primary care physician will handle any further medical issues. Please note that NO REFILLS for any discharge medications will be authorized once you are discharged, as it is imperative that you return to your primary care physician (or establish a relationship with a primary care physician if you do not have one) for your aftercare needs so that they can reassess your need for medications and monitor your lab values.  Discharge Instructions    Diet - low sodium heart healthy   Complete by: As directed    Increase activity slowly   Complete by: As  directed      Allergies as of 09/14/2019      Reactions   Tape Other (See Comments)   Skin irritation that may cause a scab if left on too long.       Medication List    STOP taking these medications   oxycodone 5 MG capsule Commonly known as: OXY-IR     TAKE these medications   acyclovir 400 MG tablet Commonly known as: ZOVIRAX Take 400 mg by mouth 2 (two) times daily.   albuterol 108 (90 Base) MCG/ACT inhaler Commonly known as: VENTOLIN HFA Inhale 1-2 puffs into the lungs every 6 (six) hours as needed for wheezing or shortness of breath.   aspirin EC 325 MG tablet Take 325 mg by mouth at bedtime.   BENADRYL ALLERGY PO Take 1 tablet by mouth at bedtime as needed (sleep).   cefdinir 300 MG capsule Commonly known as: OMNICEF Take 1 capsule (300 mg total) by mouth 2 (two) times daily for 1 day. Start taking on: September 15, 2019   Claritin 10 MG tablet Generic drug: loratadine Take 10 mg by mouth daily.   famotidine 20 MG tablet Commonly known as: PEPCID Take 20 mg by mouth daily.   fluticasone 220 MCG/ACT inhaler Commonly known as: FLOVENT HFA Inhale 2 puffs into the lungs 2 (two) times daily.   fluticasone 50 MCG/ACT  nasal spray Commonly known as: FLONASE Place 2 sprays into both nostrils daily as needed for allergies or rhinitis.   hydrocortisone 10 MG tablet Commonly known as: CORTEF Take 3 tablets (30 mg total) by mouth daily with breakfast. Start taking on: September 15, 2019 What changed:   medication strength  how much to take  when to take this   hydrocortisone 20 MG tablet Commonly known as: CORTEF Take 1 tablet (20 mg total) by mouth daily after lunch. Start taking on: September 15, 2019 What changed: You were already taking a medication with the same name, and this prescription was added. Make sure you understand how and when to take each.   hydrocortisone cream 1 % Apply 1 application topically as needed for itching.   LORazepam 0.5 MG  tablet Commonly known as: ATIVAN Take 0.5 mg by mouth every 4 (four) hours as needed for anxiety.   Magnesium Oxide 400 MG Caps Take 1 capsule (400 mg total) by mouth 5 (five) times daily. What changed: when to take this   Melatonin 3 MG Tbdp Take 3 mg by mouth at bedtime as needed (sleep).   metFORMIN 500 MG tablet Commonly known as: GLUCOPHAGE Take 0.5 tablets (250 mg total) by mouth 2 (two) times daily with a meal. What changed: how much to take   metoprolol tartrate 25 MG tablet Commonly known as: LOPRESSOR Take 1 tablet by mouth 2 (two) times daily.   MiraLax 17 g packet Generic drug: polyethylene glycol Take 1 packet by mouth daily as needed (constipation).   montelukast 10 MG tablet Commonly known as: SINGULAIR Take 10 mg by mouth daily.   Mucinex 600 MG 12 hr tablet Generic drug: guaiFENesin Take 600 mg by mouth daily.   Multi-Vitamin tablet Take by mouth.   Omeprazole 20 MG Tbec Take 20 mg by mouth daily.   ondansetron 8 MG disintegrating tablet Commonly known as: ZOFRAN-ODT Take 8 mg by mouth every 8 (eight) hours as needed for nausea or vomiting.   OneTouch Delica Plus 0000000 Misc   polyvinyl alcohol 1.4 % ophthalmic solution Commonly known as: LIQUIFILM TEARS Place 1 drop into both eyes as needed for dry eyes.   rosuvastatin 10 MG tablet Commonly known as: CRESTOR Take 10 mg by mouth at bedtime.   senna-docusate 8.6-50 MG tablet Commonly known as: Senokot-S Take 2 tablets by mouth at bedtime.   Vitamin D 50 MCG (2000 UT) Caps Take 2,000 Units by mouth at bedtime.      Allergies  Allergen Reactions  . Tape Other (See Comments)    Skin irritation that may cause a scab if left on too long.    Follow-up Information    [name], MD. Schedule an appointment as soon as possible for a visit in 1 week(s).   Specialty: Family Medicine Contact information: [address] [place] [address] [phone]             The results of significant diagnostics from this hospitalization (including imaging, microbiology, ancillary and laboratory) are listed below for reference.    Significant Diagnostic Studies: CT CHEST W CONTRAST  Result Date: 09/05/2019 CLINICAL DATA:  Nausea, vomiting, elevated white blood cell count, fever and chills the EXAM: CT CHEST AND ABDOMEN WITH CONTRAST TECHNIQUE: Multidetector CT imaging of the chest and abdomen was performed following the standard protocol during bolus administration of intravenous contrast. CONTRAST:  119mL OMNIPAQUE IOHEXOL 300 MG/ML  SOLN COMPARISON:  CT chest, abdomen and pelvis 11/05/2010 FINDINGS: CT  CHEST FINDINGS Cardiovascular: Normal heart size. No pericardial effusion. Few coronary artery calcifications are present. Atherosclerotic plaque within the normal caliber aorta. Normal 3 vessel branching of the aortic arch. Slight tortuosity of the brachiocephalic vessels. No acute aortic or periaortic abnormality is seen. Central pulmonary arteries are normal caliber. No large central filling defects are identified on this non tailored examination of the pulmonary arteries. Mediastinum/Nodes: No mediastinal fluid or gas. Normal thyroid gland and thoracic inlet. No acute abnormality of the trachea or esophagus. No worrisome mediastinal, hilar or axillary adenopathy. Lungs/Pleura: Some bandlike opacity in the mesial left lower lobe and minimally in the right lower lobe are most compatible with subsegmental atelectasis and/or scarring. No consolidation, features of edema, pneumothorax, or effusion. No suspicious pulmonary nodules or masses. Musculoskeletal: No acute osseous abnormality or suspicious osseous lesion. No chest wall mass or suspicious soft tissue lesions identified. Bilateral gynecomastia. CT ABDOMEN FINDINGS Hepatobiliary: No focal liver abnormality is seen. No gallstones, gallbladder wall thickening, or biliary dilatation. Pancreas: Small bilobed cystic  lesion possibly arising from the distal body of the pancreas versus the lesser curvature. Unchanged since exams as remote as 07/15/2017. No pancreatic ductal dilatation or surrounding inflammatory changes. Spleen: Normal in size without focal abnormality. Adrenals/Urinary Tract: Normal adrenal glands. Kidneys enhance and excrete symmetrically. Few subcentimeter hypoattenuating foci are too small to fully characterize on CT imaging but statistically likely benign. No obstructive urolithiasis or hydronephrosis. There is mild circumferential bladder wall thickening and perivesicular hazy stranding as well as periureteral stranding of the distal ureters bilaterally. Stomach/Bowel: Distal esophagus, stomach and duodenal sweep are unremarkable. No small bowel wall thickening or dilatation. No evidence of obstruction. A normal appendix is visualized. Proximal colon is unremarkable. Numerous distal colonic diverticula. No active diverticular inflammation. There is segmental thickening the proximal sigmoid colon a portion of the colon with numerous colonic diverticula which is similar to comparison exams and in the absence of adjacent pericolonic inflammation likely reflects chronic inflammatory change. Vascular/Lymphatic: Atherosclerotic plaque within the aorta and branch vessels. No aneurysm or ectasia. No acute periaortic abnormality. No suspicious or enlarged lymph nodes in the included lymphatic chains. Other: No free fluid. No free air. Small bilobed fluid collection measuring approximately 3 cm arising in the space between the pancreas and stomach, unclear origin. Detailed above in the pancreatic section. No other abnormal fluid collections in the abdomen or pelvis. No abdominopelvic free air or fluid. Few gas lucencies in the subcutaneous tissues of the left lower quadrant likely injectable related. Mild body wall edema. No bowel containing hernia. Small fat containing left inguinal hernia. Musculoskeletal:  Multilevel degenerative changes are present in the imaged portions of the spine. New anterior wedging compression deformity of L2 with approximately 40% height loss anteriorly. No adjacent fluid or hemorrhage. No other acute osseous abnormality is seen. Degenerative changes noted in the spine, hips and pelvis. IMPRESSION: 1. Mild circumferential bladder wall thickening and perivesicular hazy stranding as well as periureteral stranding of the distal ureters bilaterally. Findings are suspicious for cystitis and ascending urinary tract infection. Correlation with urinalysis is recommended. 2. Extensive distal colonic diverticulosis without evidence of acute diverticulitis. Segmental thickening of the proximal sigmoid colon may reflect chronic diverticular inflammation though correlation with outpatient colonoscopy is recommended. 3. New anterior wedging compression deformity of L2 with approximately 40% height loss anteriorly. No adjacent fluid or hemorrhage. 4. 3 cm bilobed fluid collection arising in the space between the pancreas and stomach, unclear origin but unchanged since January [id number] and almost certainly  benign. Differential diagnosis includes small pseudocyst, exophytic side branch IPMN or possible gastric diverticulum among other etiologies. If felt to be clinically warranted could consider a nonemergent outpatient abdominal MRI with MRCP for further characterization. 5. Coronary artery calcifications. 6. Bilateral gynecomastia. 7. Small fat containing left inguinal hernia. 8. Mild body wall edema. Electronically Signed   By: Lovena Le M.D.   On: 09/05/2019 19:33   MR Lumbar Spine W Wo Contrast  Result Date: 09/07/2019 CLINICAL DATA:  Low back pain. Evaluate for compression fracture. EXAM: MRI LUMBAR SPINE WITHOUT AND WITH CONTRAST TECHNIQUE: Multiplanar and multiecho pulse sequences of the lumbar spine were obtained without and with intravenous contrast. CONTRAST:  68mL GADAVIST GADOBUTROL 1 MMOL/ML  IV SOLN COMPARISON:  None. FINDINGS: Segmentation:  Standard. Alignment: Grade 1 anterolisthesis of L4 on L5 secondary to facet disease. 2 mm retrolisthesis of L2 on L3. Vertebrae: No acute fracture. Chronic L2 vertebral body compression fracture with approximately 10% height loss. No discitis or osteomyelitis. No aggressive osseous lesion. Conus medullaris and cauda equina: Conus extends to the T12 level. Conus and cauda equina appear normal. Paraspinal and other soft tissues: No acute paraspinal abnormality. Disc levels: Disc spaces: Degenerative disease with disc height loss at T11-12, T12-L1, L1-2, and L5-S1. T12-L1: No significant disc bulge. No evidence of neural foraminal stenosis. No central canal stenosis. L1-L2: Broad-based disc bulge. Mild bilateral facet arthropathy. Right subarticular recess narrowing. No evidence of neural foraminal stenosis. Mild spinal stenosis. L2-L3: Broad-based disc bulge. Moderate bilateral facet arthropathy. Moderate spinal stenosis. Bilateral subarticular recess stenosis. No foraminal stenosis. L3-L4: Broad-based disc bulge. Mild bilateral facet arthropathy. No evidence of neural foraminal stenosis. No central canal stenosis. L4-L5: Broad-based disc bulge. Severe bilateral facet arthropathy. Moderate spinal stenosis and bilateral subarticular recess stenosis. No evidence of neural foraminal stenosis. L5-S1: Broad-based disc bulge with a small central disc protrusion. Moderate bilateral facet arthropathy. Mild bilateral foraminal narrowing. No evidence of neural foraminal stenosis. No central canal stenosis. IMPRESSION: 1. Diffuse lumbar spine spondylosis as described above. 2. No acute osseous injury of the lumbar spine. Electronically Signed   By: Kathreen Devoid   On: 09/07/2019 17:05   CT ABDOMEN PELVIS W CONTRAST  Result Date: 09/05/2019 CLINICAL DATA:  Nausea, vomiting, elevated white blood cell count, fever and chills the EXAM: CT CHEST AND ABDOMEN WITH CONTRAST  TECHNIQUE: Multidetector CT imaging of the chest and abdomen was performed following the standard protocol during bolus administration of intravenous contrast. CONTRAST:  145mL OMNIPAQUE IOHEXOL 300 MG/ML  SOLN COMPARISON:  CT chest, abdomen and pelvis 11/05/2010 FINDINGS: CT CHEST FINDINGS Cardiovascular: Normal heart size. No pericardial effusion. Few coronary artery calcifications are present. Atherosclerotic plaque within the normal caliber aorta. Normal 3 vessel branching of the aortic arch. Slight tortuosity of the brachiocephalic vessels. No acute aortic or periaortic abnormality is seen. Central pulmonary arteries are normal caliber. No large central filling defects are identified on this non tailored examination of the pulmonary arteries. Mediastinum/Nodes: No mediastinal fluid or gas. Normal thyroid gland and thoracic inlet. No acute abnormality of the trachea or esophagus. No worrisome mediastinal, hilar or axillary adenopathy. Lungs/Pleura: Some bandlike opacity in the mesial left lower lobe and minimally in the right lower lobe are most compatible with subsegmental atelectasis and/or scarring. No consolidation, features of edema, pneumothorax, or effusion. No suspicious pulmonary nodules or masses. Musculoskeletal: No acute osseous abnormality or suspicious osseous lesion. No chest wall mass or suspicious soft tissue lesions identified. Bilateral gynecomastia. CT ABDOMEN FINDINGS Hepatobiliary: No  focal liver abnormality is seen. No gallstones, gallbladder wall thickening, or biliary dilatation. Pancreas: Small bilobed cystic lesion possibly arising from the distal body of the pancreas versus the lesser curvature. Unchanged since exams as remote as 07/15/2017. No pancreatic ductal dilatation or surrounding inflammatory changes. Spleen: Normal in size without focal abnormality. Adrenals/Urinary Tract: Normal adrenal glands. Kidneys enhance and excrete symmetrically. Few subcentimeter hypoattenuating foci  are too small to fully characterize on CT imaging but statistically likely benign. No obstructive urolithiasis or hydronephrosis. There is mild circumferential bladder wall thickening and perivesicular hazy stranding as well as periureteral stranding of the distal ureters bilaterally. Stomach/Bowel: Distal esophagus, stomach and duodenal sweep are unremarkable. No small bowel wall thickening or dilatation. No evidence of obstruction. A normal appendix is visualized. Proximal colon is unremarkable. Numerous distal colonic diverticula. No active diverticular inflammation. There is segmental thickening the proximal sigmoid colon a portion of the colon with numerous colonic diverticula which is similar to comparison exams and in the absence of adjacent pericolonic inflammation likely reflects chronic inflammatory change. Vascular/Lymphatic: Atherosclerotic plaque within the aorta and branch vessels. No aneurysm or ectasia. No acute periaortic abnormality. No suspicious or enlarged lymph nodes in the included lymphatic chains. Other: No free fluid. No free air. Small bilobed fluid collection measuring approximately 3 cm arising in the space between the pancreas and stomach, unclear origin. Detailed above in the pancreatic section. No other abnormal fluid collections in the abdomen or pelvis. No abdominopelvic free air or fluid. Few gas lucencies in the subcutaneous tissues of the left lower quadrant likely injectable related. Mild body wall edema. No bowel containing hernia. Small fat containing left inguinal hernia. Musculoskeletal: Multilevel degenerative changes are present in the imaged portions of the spine. New anterior wedging compression deformity of L2 with approximately 40% height loss anteriorly. No adjacent fluid or hemorrhage. No other acute osseous abnormality is seen. Degenerative changes noted in the spine, hips and pelvis. IMPRESSION: 1. Mild circumferential bladder wall thickening and perivesicular hazy  stranding as well as periureteral stranding of the distal ureters bilaterally. Findings are suspicious for cystitis and ascending urinary tract infection. Correlation with urinalysis is recommended. 2. Extensive distal colonic diverticulosis without evidence of acute diverticulitis. Segmental thickening of the proximal sigmoid colon may reflect chronic diverticular inflammation though correlation with outpatient colonoscopy is recommended. 3. New anterior wedging compression deformity of L2 with approximately 40% height loss anteriorly. No adjacent fluid or hemorrhage. 4. 3 cm bilobed fluid collection arising in the space between the pancreas and stomach, unclear origin but unchanged since January [id number] and almost certainly benign. Differential diagnosis includes small pseudocyst, exophytic side branch IPMN or possible gastric diverticulum among other etiologies. If felt to be clinically warranted could consider a nonemergent outpatient abdominal MRI with MRCP for further characterization. 5. Coronary artery calcifications. 6. Bilateral gynecomastia. 7. Small fat containing left inguinal hernia. 8. Mild body wall edema. Electronically Signed   By: Lovena Le M.D.   On: 09/05/2019 19:33   DG Chest Port 1 View  Result Date: 09/09/2019 CLINICAL DATA:  Generalized weakness. EXAM: PORTABLE CHEST 1 VIEW COMPARISON:  September 04, 2019. FINDINGS: Stable cardiomegaly. No pneumothorax or pleural effusion is noted. Both lungs are clear. The visualized skeletal structures are unremarkable. IMPRESSION: No active disease. Electronically Signed   By: Marijo Conception M.D.   On: 09/09/2019 12:23   DG CHEST PORT 1 VIEW  Result Date: 09/04/2019 CLINICAL DATA:  Nausea and vomiting and chills for 24 hours. EXAM: PORTABLE CHEST  1 VIEW COMPARISON:  Chest x-ray 11/05/2018 FINDINGS: The cardiopericardial silhouette is prominent but appears stable and most of this appears to be epicardial fat on a prior chest CT. Mild tortuosity of the  thoracic aorta. The lungs are clear of an acute process. No pleural effusions or worrisome pulmonary lesions. The bony thorax is intact. IMPRESSION: No acute cardiopulmonary findings. Electronically Signed   By: Marijo Sanes M.D.   On: 09/04/2019 06:29   VAS [place] LOWER EXTREMITY VENOUS (DVT)  Result Date: 09/05/2019  Lower Venous DVTStudy Indications: Pain, and Swelling.  Limitations: Poor ultrasound/tissue interface. Comparison Study: No prior exam. Performing Technologist: Baldwin Crown ARDMS, RVT  Examination Guidelines: A complete evaluation includes B-mode imaging, spectral Doppler, color Doppler, and power Doppler as needed of all accessible portions of each vessel. Bilateral testing is considered an integral part of a complete examination. Limited examinations for reoccurring indications may be performed as noted. The reflux portion of the exam is performed with the patient in reverse Trendelenburg.  +---------+---------------+---------+-----------+----------+-----------------+ RIGHT    CompressibilityPhasicitySpontaneityPropertiesThrombus Aging    +---------+---------------+---------+-----------+----------+-----------------+ CFV      Full           Yes      Yes                                    +---------+---------------+---------+-----------+----------+-----------------+ SFJ      Full                                                           +---------+---------------+---------+-----------+----------+-----------------+ FV Prox  Full                                                           +---------+---------------+---------+-----------+----------+-----------------+ FV Mid   Full                                                           +---------+---------------+---------+-----------+----------+-----------------+ FV DistalFull                                                            +---------+---------------+---------+-----------+----------+-----------------+ PFV      Full                                                           +---------+---------------+---------+-----------+----------+-----------------+ POP      Full           Yes      Yes                                    +---------+---------------+---------+-----------+----------+-----------------+  PTV      Full                                         poorly visualized +---------+---------------+---------+-----------+----------+-----------------+ PERO     Full                                         poorly visualized +---------+---------------+---------+-----------+----------+-----------------+   +---------+---------------+---------+-----------+----------+-----------------+ LEFT     CompressibilityPhasicitySpontaneityPropertiesThrombus Aging    +---------+---------------+---------+-----------+----------+-----------------+ CFV      Full           Yes      Yes                                    +---------+---------------+---------+-----------+----------+-----------------+ SFJ      Full                                                           +---------+---------------+---------+-----------+----------+-----------------+ FV Prox  Full                                                           +---------+---------------+---------+-----------+----------+-----------------+ FV Mid   Full                                                           +---------+---------------+---------+-----------+----------+-----------------+ FV DistalFull                                         poorly visualized +---------+---------------+---------+-----------+----------+-----------------+ PFV      Full                                                           +---------+---------------+---------+-----------+----------+-----------------+ POP      Full           Yes      Yes                                     +---------+---------------+---------+-----------+----------+-----------------+ PTV      Full                                         poorly visualized +---------+---------------+---------+-----------+----------+-----------------+ PERO     Full  poorly visualized +---------+---------------+---------+-----------+----------+-----------------+     Summary: BILATERAL: - No evidence of deep vein thrombosis seen in the lower extremities, bilaterally.  RIGHT: - Portions of this examination were limited- see technologist comments above. - No cystic structure found in the popliteal fossa.  LEFT: - Portions of this examination were limited- see technologist comments above. - No cystic structure found in the popliteal fossa.  *See table(s) above for measurements and observations. Electronically signed by Harold Barban MD on 09/05/2019 at 7:07:14 PM.    Final     Microbiology: Recent Results (from the past 240 hour(s))  Culture, Urine     Status: None   Collection Time: 09/06/19  7:49 AM   Specimen: Urine, Random  Result Value Ref Range Status   Specimen Description   Final    URINE, RANDOM Performed at [facility], [place] [address] [place], [place] [address]    Special Requests   Final    Immunocompromised Performed at [facility], [place] [address], [place], [place] [address]    Culture   Final    NO GROWTH Performed at [facility], [place] [address] [place], [place] [address]    Report Status 09/07/2019 FINAL  Final  SARS CORONAVIRUS 2 (TAT 6-24 HRS) Nasopharyngeal Nasopharyngeal Swab     Status: None   Collection Time: 09/09/19  1:09 AM   Specimen: Nasopharyngeal Swab  Result Value Ref Range Status   SARS Coronavirus 2 NEGATIVE NEGATIVE Final    Comment: (NOTE) SARS-CoV-2 target nucleic acids are NOT DETECTED. The SARS-CoV-2 RNA is generally detectable in upper and  lower respiratory specimens during the acute phase of infection. Negative results do not preclude SARS-CoV-2 infection, do not rule out co-infections with other pathogens, and should not be used as the sole basis for treatment or other patient management decisions. Negative results must be combined with clinical observations, patient history, and epidemiological information. The expected result is Negative. Fact Sheet for Patients: SugarRoll.be Fact Sheet for Healthcare Providers: https://www.woods-mathews.com/ This test is not yet approved or cleared by the [place] FDA and  has been authorized for detection and/or diagnosis of SARS-CoV-2 by FDA under an Emergency Use Authorization (EUA). This EUA will remain  in effect (meaning this test can be used) for the duration of the COVID-19 declaration under Section 56 4(b)(1) of the Act, 21 U.S.C. section 360bbb-3(b)(1), unless the authorization is terminated or revoked sooner. Performed at [facility], [place] [address], [place], [place] [address]   Culture, blood (routine x 2)     Status: None   Collection Time: 09/09/19 11:45 AM   Specimen: BLOOD  Result Value Ref Range Status   Specimen Description   Final    BLOOD LEFT ANTECUBITAL Performed at [place] [address] [place], [place] [address]    Special Requests   Final    BOTTLES DRAWN AEROBIC ONLY Blood Culture adequate volume Performed at [place] [address], [place], [place] [address]    Culture   Final    NO GROWTH 5 DAYS Performed at [facility], [place] [address], [place], [place] [address]    Report Status 09/14/2019 FINAL  Final  Culture, blood (routine x 2)     Status: None   Collection Time: 09/09/19 11:45 AM   Specimen: BLOOD  Result Value Ref Range Status   Specimen Description   Final    BLOOD LEFT ANTECUBITAL Performed at [facility]  [place] [address] [place], [place] [address]    Special Requests   Final    BOTTLES DRAWN AEROBIC ONLY Blood Culture adequate volume Performed at [place] [address], [place], [place] [address]    Culture   Final    NO GROWTH 5 DAYS Performed at [facility], [place] [address], [place], [place] [address]    Report Status 09/14/2019 FINAL  Final     Labs: Basic Metabolic Panel: Recent Labs  Lab 09/10/19 0558 09/11/19 0522 09/12/19 0525 09/13/19 0537 09/14/19 0512  NA 139 140 141 140 140  K 4.3 3.9 3.5 3.4* 3.4*  CL 113* 112* 110 109 108  CO2 22 23 25 23 24   GLUCOSE 137* 118* 94 80 89  BUN 19 17 15 13 12   CREATININE 1.07 1.01 1.01 1.03 0.99  CALCIUM 7.8* 7.9* 8.3* 8.3* 8.3*   Liver Function Tests: Recent Labs  Lab 09/08/19 1935 09/09/19 0613  AST 30 22  ALT 28 25  ALKPHOS 141* 93  BILITOT 0.7 0.6  PROT 7.3 5.7*  ALBUMIN 3.9 3.0*   Recent Labs  Lab 09/08/19 1935  LIPASE 23   No results for input(s): AMMONIA in the last 168 hours. CBC: Recent Labs  Lab 09/10/19 0558 09/11/19 0522 09/12/19 0525 09/13/19 0537 09/14/19 0512  WBC 8.4 7.3 7.6 6.7 7.3  NEUTROABS 5.1 3.8 3.2 2.8 3.0  HGB 10.8* 10.4* 11.0* 11.7* 12.9*  HCT 33.8* 32.7* 34.6* 36.3* 39.3  MCV 92.3 93.2 92.5 92.1 90.8  PLT 290 295 311 315 352   Cardiac Enzymes: No results for input(s): CKTOTAL, CKMB, CKMBINDEX, TROPONINI in the last 168 hours. BNP: BNP (last 3 results) No results for input(s): BNP in the last 8760 hours.  ProBNP (last 3 results) No results for input(s): PROBNP in the last 8760 hours.  CBG: Recent Labs  Lab 09/13/19 1242 09/13/19 1644 09/13/19 2102 09/14/19 0816 09/14/19 1155  GLUCAP 92 130* 139* 71 115*       Signed:  Alma Friendly, MD [facility] 09/14/2019, 12:47 PM

## 2019-09-14 NOTE — Progress Notes (Signed)
Pt is being discharged home. Discharge instructions including medications and follow up appointments given. Pt had no further questions at this time. 

## 2019-09-14 NOTE — Care Management Important Message (Signed)
Important Message  Patient Details IM Letter given to Roque Lias SW Case Manager to present to the Patient Name: Brandon [name] MRN: [id number] Date of Birth: 04/23/1951   Medicare Important Message Given:  Yes     Kerin Salen 09/14/2019, 12:12 PM

## 2019-09-16 LAB — CBC AND DIFFERENTIAL
HCT: 42 (ref 41–53)
Hemoglobin: 13.3 — AB (ref 13.5–17.5)
Neutrophils Absolute: 10
Platelets: 425 — AB (ref 150–399)
WBC: 13.3

## 2019-09-16 LAB — BASIC METABOLIC PANEL
BUN: 18 (ref 4–21)
CO2: 26 — AB (ref 13–22)
Chloride: 103 (ref 99–108)
Creatinine: 1.2 (ref 0.6–1.3)
Glucose: 101
Potassium: 3.8 (ref 3.4–5.3)
Sodium: 138 (ref 137–147)

## 2019-09-16 LAB — COMPREHENSIVE METABOLIC PANEL
Albumin: 3.5 (ref 3.5–5.0)
Calcium: 8.8 (ref 8.7–10.7)

## 2019-09-16 LAB — HEPATIC FUNCTION PANEL
ALT: 28 (ref 10–40)
AST: 21 (ref 14–40)
Alkaline Phosphatase: 87 (ref 25–125)
Bilirubin, Total: 0.6

## 2019-09-16 LAB — CBC: RBC: 4.59 (ref 3.87–5.11)

## 2019-09-23 LAB — ALDOSTERONE + RENIN ACTIVITY W/ RATIO
ALDO / PRA Ratio: <1 (ref 0.0–30.0)
Aldosterone: <1 ng/dL (ref 0.0–30.0)
PRA LC/MS/MS: 0.999 ng/mL/hr (ref 0.167–5.380)

## 2019-09-24 ENCOUNTER — Telehealth: Payer: Self-pay | Admitting: Family Medicine

## 2019-09-24 NOTE — Telephone Encounter (Signed)
Deanna states it was sent on 10/17 - order number is [phone] - asked for a returned call

## 2019-09-24 NOTE — Telephone Encounter (Signed)
Re-faxed.

## 2019-09-24 NOTE — Telephone Encounter (Signed)
Deanna is calling from [facility] asking if Dr.Hunter has received the orders to continue care for the patient

## 2019-10-01 ENCOUNTER — Other Ambulatory Visit: Payer: Self-pay

## 2019-10-01 ENCOUNTER — Encounter: Payer: Self-pay | Admitting: Family Medicine

## 2019-10-01 ENCOUNTER — Ambulatory Visit (INDEPENDENT_AMBULATORY_CARE_PROVIDER_SITE_OTHER): Payer: Medicare Other | Admitting: Family Medicine

## 2019-10-01 VITALS — BP 124/78 | HR 80 | Temp 98.6°F | Ht 70.0 in | Wt 237.0 lb

## 2019-10-01 DIAGNOSIS — E2740 Unspecified adrenocortical insufficiency: Secondary | ICD-10-CM

## 2019-10-01 DIAGNOSIS — R739 Hyperglycemia, unspecified: Secondary | ICD-10-CM | POA: Diagnosis not present

## 2019-10-01 DIAGNOSIS — D89813 Graft-versus-host disease, unspecified: Secondary | ICD-10-CM

## 2019-10-01 DIAGNOSIS — I10 Essential (primary) hypertension: Secondary | ICD-10-CM

## 2019-10-01 DIAGNOSIS — K869 Disease of pancreas, unspecified: Secondary | ICD-10-CM

## 2019-10-01 DIAGNOSIS — Z7952 Long term (current) use of systemic steroids: Secondary | ICD-10-CM

## 2019-10-01 DIAGNOSIS — E272 Addisonian crisis: Secondary | ICD-10-CM

## 2019-10-01 DIAGNOSIS — T380X5A Adverse effect of glucocorticoids and synthetic analogues, initial encounter: Secondary | ICD-10-CM

## 2019-10-01 DIAGNOSIS — Z8673 Personal history of transient ischemic attack (TIA), and cerebral infarction without residual deficits: Secondary | ICD-10-CM

## 2019-10-01 DIAGNOSIS — J309 Allergic rhinitis, unspecified: Secondary | ICD-10-CM

## 2019-10-01 DIAGNOSIS — E785 Hyperlipidemia, unspecified: Secondary | ICD-10-CM

## 2019-10-01 DIAGNOSIS — S32020D Wedge compression fracture of second lumbar vertebra, subsequent encounter for fracture with routine healing: Secondary | ICD-10-CM

## 2019-10-01 MED ORDER — METFORMIN HCL 500 MG PO TABS: 500.0000 mg | ORAL_TABLET | Freq: Two times a day (BID) | ORAL | 3 refills | Status: DC

## 2019-10-01 MED ORDER — AMLODIPINE BESYLATE 10 MG PO TABS: 10.0000 mg | ORAL_TABLET | Freq: Every day | ORAL | 3 refills | Status: DC

## 2019-10-01 NOTE — Patient Instructions (Addendum)
Health Maintenance Due  Topic Date Due  . TETANUS/TDAP working on getting records  12/06/2015  . PNA vac Low Risk Adult (2 of 2 - PPSV23) working on records  08/27/2019   Schedule labs a few days before your next visit on June 1st with me.   Schedule your bone density test at check out desk. You may also call directly to X-ray at [phone] to schedule an appointment that is convenient for you.  - located [address] across the [address] from [place] - in the basement - you do need an appointment for the bone density tests.   Try voltaren gel on the right wrist. Also buy thumb spica splint and wear as much as able for next 2-3 weeks- if not doing better can refer to sports medicines for their opinion  Try xyzal starting sunday  Recommended follow up: Return for next already scheduled visit.

## 2019-10-01 NOTE — Progress Notes (Signed)
Phone [phone] In person visit   Subjective:   Brandon [name] is a 69 y.o. year old very pleasant male patient who presents for/with See problem oriented charting Chief Complaint  Patient presents with  . Hospitalization Follow-up   This visit occurred during the SARS-CoV-2 public health emergency.  Safety protocols were in place, including screening questions prior to the visit, additional usage of staff PPE, and extensive cleaning of exam room while observing appropriate contact time as indicated for disinfecting solutions.   Past Medical History-  Patient Active Problem List   Diagnosis Date Noted  . Allergic rhinitis 10/03/2019    Priority: High  . History of CVA (cerebrovascular accident) 03/10/2019    Priority: High  . Adrenocortical insufficiency ([place]) 08/06/2017    Priority: High  . GVHD (graft versus host disease) ([place]) 03/04/2017    Priority: High  . History of myelodysplastic syndrome 12/06/2015    Priority: High  . Steroid-induced hyperglycemia 08/16/2014    Priority: High  . History of DVT (deep vein thrombosis) 09/25/2018    Priority: Medium  . Post chemotherapy Dry eyes due to decreased tear production 08/06/2017    Priority: Medium  . post chemotherapy Peripheral neuropathy 08/06/2017    Priority: Medium  . BPH associated with nocturia 11/15/2014    Priority: Medium  . Hyperlipidemia 02/04/2007    Priority: Medium  . Essential hypertension 02/04/2007    Priority: Medium  . History of stem cell transplant ([place]) 03/04/2017    Priority: Low  . Leukopenia 11/15/2014    Priority: Low  . Chest pain 08/13/2014    Priority: Low  . GERD (gastroesophageal reflux disease) 05/03/2014    Priority: Low  . Cervical disc disorder with radiculopathy of cervical region 07/14/2010    Priority: Low  . MIXED HEARING LOSS BILATERAL 07/14/2010    Priority: Low  . ERECTILE DYSFUNCTION 02/07/2007    Priority: Low  . Pancreatic lesion 10/03/2019    Medications-  reviewed and updated Current Outpatient Medications  Medication Sig Dispense Refill  . acyclovir (ZOVIRAX) 400 MG tablet Take 400 mg by mouth 2 (two) times daily.    Marland Kitchen aspirin EC 325 MG tablet Take 325 mg by mouth at bedtime.     . Cholecalciferol (VITAMIN D) 50 MCG (2000 UT) CAPS Take 2,000 Units by mouth at bedtime.     . famotidine (PEPCID) 20 MG tablet Take 20 mg by mouth daily.    . fluticasone (FLONASE) 50 MCG/ACT nasal spray Place 2 sprays into both nostrils daily as needed for allergies or rhinitis.    . fluticasone (FLOVENT HFA) 220 MCG/ACT inhaler Inhale 2 puffs into the lungs 2 (two) times daily.     . hydrocortisone (CORTEF) 20 MG tablet Take 1 tablet (20 mg total) by mouth daily after lunch. (Patient taking differently: Take 20 mg by mouth daily after lunch. Now on 35 mg for this week then tapering down per transplant) 30 tablet 0  . hydrocortisone cream 1 % Apply 1 application topically as needed for itching.    . Lancets (ONETOUCH DELICA PLUS [id number]) MISC     . loratadine (CLARITIN) 10 MG tablet Take 10 mg by mouth daily.    Marland Kitchen LORazepam (ATIVAN) 0.5 MG tablet Take 0.5 mg by mouth every 4 (four) hours as needed for anxiety.    . Magnesium Oxide 400 MG CAPS Take 1 capsule (400 mg total) by mouth 5 (five) times daily. (Patient taking differently: Take 400 mg by mouth daily. )    .  metFORMIN (GLUCOPHAGE) 500 MG tablet Take 1 tablet (500 mg total) by mouth 2 (two) times daily with a meal. 180 tablet 3  . metoprolol tartrate (LOPRESSOR) 25 MG tablet Take 1 tablet by mouth 2 (two) times daily.    . montelukast (SINGULAIR) 10 MG tablet Take 10 mg by mouth daily.     . Multiple Vitamin (MULTI-VITAMIN) tablet Take by mouth.    . Omeprazole 20 MG TBEC Take 20 mg by mouth daily.     . ondansetron (ZOFRAN-ODT) 8 MG disintegrating tablet Take 8 mg by mouth every 8 (eight) hours as needed for nausea or vomiting.    . polyethylene glycol (MIRALAX) 17 g packet Take 1 packet by mouth daily as  needed (constipation).     . polyvinyl alcohol (LIQUIFILM TEARS) 1.4 % ophthalmic solution Place 1 drop into both eyes as needed for dry eyes.    . rosuvastatin (CRESTOR) 10 MG tablet Take 10 mg by mouth at bedtime.     . senna-docusate (SENOKOT-S) 8.6-50 MG tablet Take 2 tablets by mouth at bedtime.     Marland Kitchen albuterol (PROVENTIL HFA;VENTOLIN HFA) 108 (90 Base) MCG/ACT inhaler Inhale 1-2 puffs into the lungs every 6 (six) hours as needed for wheezing or shortness of breath.     Marland Kitchen amLODipine (NORVASC) 10 MG tablet Take 1 tablet (10 mg total) by mouth daily. 90 tablet 3  . hydrocortisone (CORTEF) 10 MG tablet Take 3 tablets (30 mg total) by mouth daily with breakfast. 90 tablet 0   No current facility-administered medications for this visit.     Objective:  BP 124/78   Pulse 80   Temp 98.6 F (37 C) (Temporal)   Ht 5\' 10"  (1.778 m)   Wt 237 lb (107.5 kg)   SpO2 96%   BMI 34.01 kg/m  Gen: NAD, resting comfortably     Assessment and Plan  # Hospital follow up  S:Patient with complex history particularly notable for ongoing adrenal insufficiency with difficulty weaning steroids after history of MDS status post stem cell transplant was hospitalized from September 03, 2019 to September 08, 2019 for intractable nausea and vomiting for 1 day which occurred after subjective fever and chills and congestion 3-4 days prior.  Initially his endocrinologist recommended increasing his hydrocortisone (had recently been decreased to 10 mg in the morning and 5 mg in the evening of Cortef) but then patient had the intractable vomiting and had to present to the emergency room.  COVID-19 test was negative.  Had leukocytosis and worsening of his creatinine up to 1.5 from baseline of 1.1 in January 2021.  Patient was given stress dose of hydrocortisone 100 mg IV and fluid bolus-was treated essentially for adrenal crisis.  CT of chest abdomen pelvis was done to look for source of occult infection-potential cystitis and a  sending urinary tract infection were noted.  There was concern for possible segmental thickening of proximal sigmoid colon-thought to be possible chronic diverticular inflammation.  Compression fracture at L2 was noted.  Patient also had a stable 3 cm bilobed fluid collection between the pancreas and stomach which was stable-outpatient abdominal MRI with MRCP for further characterization was to be considered .  Urine culture was negative.  Infectious disease was consulted.  L2 compression fracture was noted with no evidence of active infectious process-Rocephin was stopped after 1 day per ID consult.  Oncology saw patient and did peripheral smear review which showed no evidence of recurrent MDS or leukemia.  Patient's transplant oncologist Dr.  Sarantopoulos was consulted frequently during visit.Patient did have elevated procalcitonin but this trended down during hospitalization.  Blood cultures were negative.  Leukocytosis also improved during hospitalization.  At discharge patient was on 20 mg of Cortef twice a day.  Patient's creatinine returned to baseline before discharge.    Given patient's history of CVA he was continued on aspirin and statin  Of note amlodipine 10 mg was stopped before patient was discharged from the hospital-he was continued on metoprolol-patient reports he restarted amlodipine as soon as he got home.  He was continued on Metformin 2050 mg twice daily with meals for steroid-induced hyperglycemia -last A1c was in acceptable range  Patient was discharged 09/08/19 but got sick again and had to go back until 09/14/19 due to intractable nausea and vomiting just after discharge within a few hours after being discharged on 20 mg of Cortef twice a day.  The nausea and vomiting was thought secondary to adrenal crisis.  He was restarted on antibioticsand had extensive testing done again. Still no source of infection found.  He was hospitalized from March 16 to March 22.  He was discharged on a  higher course of Cortef at home 30 mg in the morning 20 mg in the evening with a taper set up by Dr. [name].  There was some concern for poor absorption versus graft-versus-host disease and GI tract.  Patient knows to double his steroid dose if he begins to feel ill in the future immediately and reach out to endocrinology.  We also discussed today early antibiotics-such as when he may have had a sinus infection-he states he has ongoing issues sinus congestion-they seem very clear when he is on antibiotics in the hospital and they have slowly worsened over time.  He is on Singulair as an inhalers for graft-versus-host disease but Singulair may also help with some of his ongoing allergy symptoms.  He is also been using Claritin before bed.  He also uses Coricidin high blood pressure if has a cough and has a fair amount of bed available at home.  We also discussed possible allergist or ENT referral as she declines for now  Patient has other concerns such as weight gain.  He also tells me he has upcoming cataract surgery-he discussed this with endocrinology and no higher dose of Cortef was recommended.  Patient also reports he was told it is good he came off his antifungal as it may have been reducing absorption of Cortef.  With ongoing steroid use-patient is agreeable to updating bone density  A/P:   Adrenocortical insufficiency ([place]) Multiple hospitalizations.  Managed by [facility] endocrinology Dr. [name].  Most recently may have been triggered by mild sinus infection-work-up ultimately was unclear of source.  Patient essentially with 11-day hospitalization for adrenal crisis -he had gone in on 15 mg Cortef total and has been discharged on 50 mg Cortef total with titration down for Dr. Alma Friendly like his in dose is going to be still 25 mg total per day with 15 in the morning and 10 in the evening.  He knows to double dose and seek care immediately if begins to have any symptoms whatsoever.  Improved at  this time-continue titration down  With long-term steroid use we will also update DEXA  Adrenal crisis ([place]) Thankfully adrenal crisis has resolved at this time.  Steroid-induced hyperglycemia  Hemoglobin A1c was well controlled on last check-patient has increased after discussion with endocrinology from 250 mg twice daily to full tablet 500 mg twice daily  and is tolerating this well-we will repeat A1c before next visit Lab Results  Component Value Date   HGBA1C 6.6 (H) 07/22/2019   HGBA1C 6.1 (H) 04/07/2019   HGBA1C 8.7 (H) 01/15/2019   Patient is discouraged by his weight gain-I told him it is very challenging 1 being on steroids long-term to manage weight.  He feels like he does well in the hospital and he is considering a meal preparation system like [employer]  History of CVA (cerebrovascular accident) LDL looks excellent at 55 on rosuvastatin 10 mg.  Patient is also compliant with aspirin 325 mg.  Had testing with [facility] and was determined he did not need to be on Plavix.  He is doing all the right things for stroke prevention-continue current medications  GVHD (graft versus host disease) ([place]) From pulmonary perspective appears stable-remains on Flovent and Singulair.  There is some question on active graft-versus-host disease and GI tract could be causing poor absorption of Cortef-defer management of this to the [facility] team  From our end-we would like to try to reduce omeprazole to 10 mg at follow-up if reflux continues to be well controlled to see if that can help with absorption.  Appears he is already on Pepcid.  We are waiting to make sure he is stable on his Cortef dose.  Allergic rhinitis Sinuses may have been a trigger for recent hospitalization/adrenal insufficiency-he has slight temperature and sinus congestion several days before hospitalization which resolved today after.  He felt like his symptoms were better while hospitalized on antibiotics For the future we discussed  potentially using early antibiotics if has any sinus symptoms increase or fever For allergies I would consider starting Singulair if oncology take small from graft-versus-host disease perspective He has been on Claritin before bed but we agreed to try Xyzal to see if that is more helpful as he does have some lingering very mild sinus issues -possible ENT or allergist if recurring issues- decades since has seenallergist    Pancreatic lesion Sent message after visit " Hey Mr. Flozell, [place] to see you this week. I had made a note to discuss with you at visit  (but we had a lot of other things to cover and sorry I didn't mention it) the following finding from your CT scan   ". 3 cm bilobed fluid collection arising in the space between the pancreas and stomach, unclear origin but unchanged since January [id number] and almost certainly benign. Differential diagnosis includes small pseudocyst, exophytic side branch IPMN or possible gastric diverticulum among other etiologies. If felt to be clinically warranted could consider a nonemergent outpatient abdominal MRI with MRCP for further characterization."  Basically there is a small area between the pancreas and stomach that has been unchanged over the last 2 years.  I was wondering if you would be okay with me ordering MRI just to be on the safe side and further characterize this and get radiology recommendations on follow-up.  Thanks for considering,  Garret Reddish"   #-Plan to check in on cataract surgery at next visit  #De Quervain's tenosynovitis-Patient also has some right wrist pain-Finkelstein's test is positive and this looks like de Quervain's tenosynovitis-recommended thumb spica splint-he declined sports medicine for now.  Could also use the tear gel -I did not mention this under objective section  Recommended follow up: Return for next already scheduled visit. Future Appointments  Date Time Provider [place]  11/18/2019   8:30 AM [facility]  11/24/2019  1:20 PM [name], MD [facility]    Lab/Order associations:   ICD-10-CM   1. Essential hypertension  I10 CBC with Differential/Platelet    Comprehensive metabolic panel    LDL cholesterol, direct    CANCELED: CBC with Differential/Platelet    CANCELED: Comprehensive metabolic panel  2. Hyperlipidemia, unspecified hyperlipidemia type  E78.5 CBC with Differential/Platelet    Comprehensive metabolic panel    LDL cholesterol, direct    CANCELED: LDL cholesterol, direct  3. Steroid-induced hyperglycemia  R73.9 Hemoglobin A1c   T38.0X5A   4. Adrenal insufficiency (HCC)  E27.40 Comprehensive metabolic panel  5. Closed compression fracture of L2 lumbar vertebra with routine healing, subsequent encounter  S32.020D DG Bone Density  6. Long term systemic steroid user  Z79.52 DG Bone Density  7. Adrenocortical insufficiency (HCC)  E27.40   8. Adrenal crisis ([place])  E27.2   9. History of CVA (cerebrovascular accident)  Z86.73   10. GVHD (graft versus host disease) ([place])  D89.813   11. Allergic rhinitis, unspecified seasonality, unspecified trigger  J30.9   12. Pancreatic lesion  K86.9     Meds ordered this encounter  Medications  . amLODipine (NORVASC) 10 MG tablet    Sig: Take 1 tablet (10 mg total) by mouth daily.    Dispense:  90 tablet    Refill:  3  . metFORMIN (GLUCOPHAGE) 500 MG tablet    Sig: Take 1 tablet (500 mg total) by mouth 2 (two) times daily with a meal.    Dispense:  180 tablet    Refill:  3    Time Spent: 49 minutes of total time (1:04 PM- 1:53 PM) was spent on the date of the encounter performing the following actions: chart review prior to seeing the patient, obtaining history, performing a medically necessary exam, counseling on the treatment plan, placing orders, and documenting in our EHR.   Return precautions advised.  Garret Reddish, MD

## 2019-10-02 ENCOUNTER — Telehealth: Payer: Self-pay

## 2019-10-02 ENCOUNTER — Ambulatory Visit (INDEPENDENT_AMBULATORY_CARE_PROVIDER_SITE_OTHER)
Admission: RE | Admit: 2019-10-02 | Discharge: 2019-10-02 | Disposition: A | Discharge status: Home / Self Care | Payer: Medicare Other | Source: Ambulatory Visit | Attending: Family Medicine | Admitting: Family Medicine

## 2019-10-02 DIAGNOSIS — S32020D Wedge compression fracture of second lumbar vertebra, subsequent encounter for fracture with routine healing: Secondary | ICD-10-CM | POA: Diagnosis not present

## 2019-10-02 DIAGNOSIS — Z7952 Long term (current) use of systemic steroids: Secondary | ICD-10-CM

## 2019-10-02 NOTE — Telephone Encounter (Signed)
RN received notified from [facility] that patient's labs were reviewed during a quality assurance review of [facility] activity and some results for patient had the potential of being impacted.    Pt notified, pt does not wish to proceed with re-testing at this time.

## 2019-10-03 ENCOUNTER — Encounter: Payer: Self-pay | Admitting: Family Medicine

## 2019-10-03 DIAGNOSIS — K869 Disease of pancreas, unspecified: Secondary | ICD-10-CM | POA: Insufficient documentation

## 2019-10-03 DIAGNOSIS — J309 Allergic rhinitis, unspecified: Secondary | ICD-10-CM | POA: Insufficient documentation

## 2019-10-03 NOTE — Assessment & Plan Note (Addendum)
From pulmonary perspective appears stable-remains on Flovent and Singulair.  There is some question on active graft-versus-host disease and GI tract could be causing poor absorption of Cortef-defer management of this to the [place] team  From our end-we would like to try to reduce omeprazole to 10 mg at follow-up if reflux continues to be well controlled to see if that can help with absorption.  Appears he is already on Pepcid.  We are waiting to make sure he is stable on his Cortef dose.

## 2019-10-03 NOTE — Assessment & Plan Note (Signed)
LDL looks excellent at 55 on rosuvastatin 10 mg.  Patient is also compliant with aspirin 325 mg.  Had testing with [facility] and was determined he did not need to be on Plavix.  He is doing all the right things for stroke prevention-continue current medications

## 2019-10-03 NOTE — Assessment & Plan Note (Signed)
Sinuses may have been a trigger for recent hospitalization/adrenal insufficiency-he has slight temperature and sinus congestion several days before hospitalization which resolved today after.  He felt like his symptoms were better while hospitalized on antibiotics For the future we discussed potentially using early antibiotics if has any sinus symptoms increase or fever For allergies I would consider starting Singulair if oncology take small from graft-versus-host disease perspective He has been on Claritin before bed but we agreed to try Xyzal to see if that is more helpful as he does have some lingering very mild sinus issues -possible ENT or allergist if recurring issues- decades since has seenallergist

## 2019-10-03 NOTE — Assessment & Plan Note (Addendum)
Multiple hospitalizations.  Managed by [facility] endocrinology Dr. [name].  Most recently may have been triggered by mild sinus infection-work-up ultimately was unclear of source.  Patient essentially with 11-day hospitalization for adrenal crisis -he had gone in on 15 mg Cortef total and has been discharged on 50 mg Cortef total with titration down for Dr. Alma Friendly like his in dose is going to be still 25 mg total per day with 15 in the morning and 10 in the evening.  He knows to double dose and seek care immediately if begins to have any symptoms whatsoever.  Improved at this time-continue titration down  With long-term steroid use we will also update DEXA

## 2019-10-03 NOTE — Assessment & Plan Note (Signed)
Thankfully adrenal crisis has resolved at this time.

## 2019-10-03 NOTE — Assessment & Plan Note (Signed)
Sent message after visit " Hey Mr. Grason, [place] to see you this week. I had made a note to discuss with you at visit  (but we had a lot of other things to cover and sorry I didn't mention it) the following finding from your CT scan   ". 3 cm bilobed fluid collection arising in the space between the pancreas and stomach, unclear origin but unchanged since January [id number] and almost certainly benign. Differential diagnosis includes small pseudocyst, exophytic side branch IPMN or possible gastric diverticulum among other etiologies. If felt to be clinically warranted could consider a nonemergent outpatient abdominal MRI with MRCP for further characterization."  Basically there is a small area between the pancreas and stomach that has been unchanged over the last 2 years.  I was wondering if you would be okay with me ordering MRI just to be on the safe side and further characterize this and get radiology recommendations on follow-up.  Thanks for considering,  Garret Reddish"

## 2019-10-03 NOTE — Assessment & Plan Note (Addendum)
Hemoglobin A1c was well controlled on last check-patient has increased after discussion with endocrinology from 250 mg twice daily to full tablet 500 mg twice daily and is tolerating this well-we will repeat A1c before next visit Lab Results  Component Value Date   HGBA1C 6.6 (H) 07/22/2019   HGBA1C 6.1 (H) 04/07/2019   HGBA1C 8.7 (H) 01/15/2019   Patient is discouraged by his weight gain-I told him it is very challenging 1 being on steroids long-term to manage weight.  He feels like he does well in the hospital and he is considering a meal preparation system like [employer]

## 2019-10-05 ENCOUNTER — Encounter: Payer: Self-pay | Admitting: Family Medicine

## 2019-10-05 DIAGNOSIS — M8580 Other specified disorders of bone density and structure, unspecified site: Secondary | ICD-10-CM | POA: Insufficient documentation

## 2019-10-08 HISTORY — PX: CATARACT EXTRACTION: SUR2

## 2019-11-16 ENCOUNTER — Other Ambulatory Visit (INDEPENDENT_AMBULATORY_CARE_PROVIDER_SITE_OTHER): Payer: Medicare Other

## 2019-11-16 ENCOUNTER — Other Ambulatory Visit: Payer: Self-pay

## 2019-11-16 DIAGNOSIS — T380X5A Adverse effect of glucocorticoids and synthetic analogues, initial encounter: Secondary | ICD-10-CM | POA: Diagnosis not present

## 2019-11-16 DIAGNOSIS — I10 Essential (primary) hypertension: Secondary | ICD-10-CM

## 2019-11-16 DIAGNOSIS — E2740 Unspecified adrenocortical insufficiency: Secondary | ICD-10-CM

## 2019-11-16 DIAGNOSIS — R739 Hyperglycemia, unspecified: Secondary | ICD-10-CM

## 2019-11-16 DIAGNOSIS — E785 Hyperlipidemia, unspecified: Secondary | ICD-10-CM

## 2019-11-16 LAB — COMPREHENSIVE METABOLIC PANEL
ALT: 15 U/L (ref 0–53)
AST: 15 U/L (ref 0–37)
Albumin: 3.7 g/dL (ref 3.5–5.2)
Alkaline Phosphatase: 75 U/L (ref 39–117)
BUN: 14 mg/dL (ref 6–23)
CO2: 27 mEq/L (ref 19–32)
Calcium: 9 mg/dL (ref 8.4–10.5)
Chloride: 106 mEq/L (ref 96–112)
Creatinine, Ser: 1.13 mg/dL (ref 0.40–1.50)
GFR: 64.11 mL/min (ref >60.00)
Glucose, Bld: 94 mg/dL (ref 70–99)
Potassium: 4.2 mEq/L (ref 3.5–5.1)
Sodium: 139 mEq/L (ref 135–145)
Total Bilirubin: 0.5 mg/dL (ref 0.2–1.2)
Total Protein: 6 g/dL (ref 6.0–8.3)

## 2019-11-16 LAB — CBC WITH DIFFERENTIAL/PLATELET
Basophils Absolute: 0.1 10*3/uL (ref 0.0–0.1)
Basophils Relative: 0.6 % (ref 0.0–3.0)
Eosinophils Absolute: 0.8 10*3/uL — ABNORMAL HIGH (ref 0.0–0.7)
Eosinophils Relative: 7.8 % — ABNORMAL HIGH (ref 0.0–5.0)
HCT: 39.5 % (ref 39.0–52.0)
Hemoglobin: 13.1 g/dL (ref 13.0–17.0)
Lymphocytes Relative: 40.2 % (ref 12.0–46.0)
Lymphs Abs: 3.9 10*3/uL (ref 0.7–4.0)
MCHC: 33.1 g/dL (ref 30.0–36.0)
MCV: 87.2 fl (ref 78.0–100.0)
Monocytes Absolute: 1.2 10*3/uL — ABNORMAL HIGH (ref 0.1–1.0)
Monocytes Relative: 12.7 % — ABNORMAL HIGH (ref 3.0–12.0)
Neutro Abs: 3.8 10*3/uL (ref 1.4–7.7)
Neutrophils Relative %: 38.7 % — ABNORMAL LOW (ref 43.0–77.0)
Platelets: 385 10*3/uL (ref 150.0–400.0)
RBC: 4.53 Mil/uL (ref 4.22–5.81)
RDW: 16.8 % — ABNORMAL HIGH (ref 11.5–15.5)
WBC: 9.7 10*3/uL (ref 4.0–10.5)

## 2019-11-16 LAB — HEMOGLOBIN A1C: Hgb A1c MFr Bld: 6.7 % — ABNORMAL HIGH (ref 4.6–6.5)

## 2019-11-16 LAB — LDL CHOLESTEROL, DIRECT: Direct LDL: 76 mg/dL

## 2019-11-17 NOTE — Progress Notes (Signed)
Phone [phone] In person visit   Subjective:   Brandon [name] is a 69 y.o. year old very pleasant male patient who presents for/with See problem oriented charting Chief Complaint  Patient presents with  . Hypertension   This visit occurred during the SARS-CoV-2 public health emergency.  Safety protocols were in place, including screening questions prior to the visit, additional usage of staff PPE, and extensive cleaning of exam room while observing appropriate contact time as indicated for disinfecting solutions.   Past Medical History-  Patient Active Problem List   Diagnosis Date Noted  . Allergic rhinitis 10/03/2019    Priority: High  . History of CVA (cerebrovascular accident) 03/10/2019    Priority: High  . Adrenocortical insufficiency ([place]) 08/06/2017    Priority: High  . GVHD (graft versus host disease) ([place]) 03/04/2017    Priority: High  . History of myelodysplastic syndrome 12/06/2015    Priority: High  . Steroid-induced hyperglycemia 08/16/2014    Priority: High  . History of DVT (deep vein thrombosis) 09/25/2018    Priority: Medium  . Post chemotherapy Dry eyes due to decreased tear production 08/06/2017    Priority: Medium  . post chemotherapy Peripheral neuropathy 08/06/2017    Priority: Medium  . BPH associated with nocturia 11/15/2014    Priority: Medium  . Hyperlipidemia 02/04/2007    Priority: Medium  . Essential hypertension 02/04/2007    Priority: Medium  . History of stem cell transplant ([place]) 03/04/2017    Priority: Low  . Leukopenia 11/15/2014    Priority: Low  . Chest pain 08/13/2014    Priority: Low  . GERD (gastroesophageal reflux disease) 05/03/2014    Priority: Low  . Cervical disc disorder with radiculopathy of cervical region 07/14/2010    Priority: Low  . MIXED HEARING LOSS BILATERAL 07/14/2010    Priority: Low  . ERECTILE DYSFUNCTION 02/07/2007    Priority: Low  . Osteopenia 10/05/2019  . Pancreatic lesion 10/03/2019     Medications- reviewed and updated Current Outpatient Medications  Medication Sig Dispense Refill  . acyclovir (ZOVIRAX) 400 MG tablet Take 400 mg by mouth 2 (two) times daily.    Marland Kitchen albuterol (PROVENTIL HFA;VENTOLIN HFA) 108 (90 Base) MCG/ACT inhaler Inhale 1-2 puffs into the lungs every 6 (six) hours as needed for wheezing or shortness of breath.     Marland Kitchen amLODipine (NORVASC) 10 MG tablet Take 1 tablet (10 mg total) by mouth daily. 90 tablet 3  . aspirin EC 325 MG tablet Take 325 mg by mouth at bedtime.     . Cholecalciferol (VITAMIN D) 50 MCG (2000 UT) CAPS Take 2,000 Units by mouth at bedtime.     . famotidine (PEPCID) 20 MG tablet Take 20 mg by mouth daily.    . fluticasone (FLONASE) 50 MCG/ACT nasal spray Place 2 sprays into both nostrils daily as needed for allergies or rhinitis.    . fluticasone (FLOVENT HFA) 220 MCG/ACT inhaler Inhale 2 puffs into the lungs 2 (two) times daily.     . hydrocortisone cream 1 % Apply 1 application topically as needed for itching.    . Lancets (ONETOUCH DELICA PLUS [id number]) MISC     . loratadine (CLARITIN) 10 MG tablet Take 10 mg by mouth daily.    Marland Kitchen LORazepam (ATIVAN) 0.5 MG tablet Take 0.5 mg by mouth every 4 (four) hours as needed for anxiety.    . Magnesium Oxide 400 MG CAPS Take 1 capsule (400 mg total) by mouth 5 (five) times daily. (Patient  taking differently: Take 400 mg by mouth daily. )    . metFORMIN (GLUCOPHAGE) 500 MG tablet Take 1 tablet (500 mg total) by mouth 2 (two) times daily with a meal. 180 tablet 3  . metoprolol tartrate (LOPRESSOR) 25 MG tablet Take 1 tablet by mouth 2 (two) times daily.    . montelukast (SINGULAIR) 10 MG tablet Take 10 mg by mouth daily.     . Multiple Vitamin (MULTI-VITAMIN) tablet Take by mouth.    . ondansetron (ZOFRAN-ODT) 8 MG disintegrating tablet Take 8 mg by mouth every 8 (eight) hours as needed for nausea or vomiting.    . polyethylene glycol (MIRALAX) 17 g packet Take 1 packet by mouth daily as needed  (constipation).     . polyvinyl alcohol (LIQUIFILM TEARS) 1.4 % ophthalmic solution Place 1 drop into both eyes as needed for dry eyes.    Marland Kitchen senna-docusate (SENOKOT-S) 8.6-50 MG tablet Take 2 tablets by mouth at bedtime.     Marland Kitchen amoxicillin-clavulanate (AUGMENTIN) 875-125 MG tablet Take 1 tablet by mouth 2 (two) times daily. 14 tablet 0  . omeprazole (PRILOSEC) 10 MG capsule Take 1 capsule (10 mg total) by mouth daily. 30 capsule 5  . rosuvastatin (CRESTOR) 20 MG tablet Take 1 tablet (20 mg total) by mouth daily. 90 tablet 3   No current facility-administered medications for this visit.     Objective:  BP 122/68   Pulse 74   Temp 98.6 F (37 C) (Temporal)   Ht 5\' 10"  (1.778 m)   Wt 242 lb (109.8 kg)   SpO2 96%   BMI 34.72 kg/m  Gen: NAD, resting comfortably CV: RRR no murmurs rubs or gallops Lungs: CTAB no crackles, wheeze, rhonchi Abdomen: soft/nontender/nondistended/normal bowel sounds.  Obese  Ext: 1+ edema stable Skin: warm, dry    Assessment and Plan   % Cataract surgery-good outcome- he is very pleased. Sees optho tomorrow.   #De Quervain's tenosynovitis-recommended thumb spica splint. Gets better then worse and notes some issues in other sides. Some improvement. Also notes some on the left wrist but not as bad. voltaren gel tried for 3 weeks.  Unclear trigger.   #CT showed possible lesion between the stomach and pancreas-this was done in March 2021 but has been stable over 2 years-radiology thought this was highly likely to be benign/noncancerous-we jointly agreed to likely do MRCP in September to November range he is also going to ask his transplant team for their opinion at his September visit.  #Steroid-induced hyperglycemia S: Medication: Metformin 500 mg twice daily CBGs- 90-106 fasting Exercise and diet- unfortunately weight is up another 5 pounds with prior steroid doses after most recent hospitalization Lab Results  Component Value Date   HGBA1C 6.7 (H)  11/16/2019   HGBA1C 6.6 (H) 07/22/2019   HGBA1C 6.1 (H) 04/07/2019   A/P: reasonable control= continue current meds - he reports weight stable on home scales in the mornings- edema/fulid retention could contribute with oingoing steroid use  #hyperlipidemia/history of stroke S: Medication: Crestor 10Mg  (had been off with transplant) Lab Results  Component Value Date   CHOL 146 04/14/2019   HDL 53.00 04/14/2019   LDLCALC 55 04/14/2019   LDLDIRECT 76.0 11/16/2019   TRIG 191.0 (H) 04/14/2019   CHOLHDL 3 04/14/2019   A/P: mild poor control-increase Crestor to 20 mg with LDL goal under 70.  Also continue aspirin 325 mg with history of stroke  # GERD S: Omeprazole 20Mg -there have been questions about absorption about steroids. Some nausea  with decreasing steroids. Also on pepcid at night.  B12 levels related to PPI use: last checked 2017 Lab Results  Component Value Date   [id number] 11/11/2015  A/P: Good control of reflux-last visit we did not want to make any changes with his changes in steroids but since things have been pretty stable we opted to try to decrease to 10 mg omeprazole-if he has new or worsening symptoms he will let us know-obviously decreasing this could also increase his cough if cough is contributed to by reflux  #hypertension S: medication: Metoprolol 25Mg  BID Home readings #s:  excellent BP Readings from Last 3 Encounters:  11/24/19 122/68  10/01/19 124/78  09/14/19 133/80  A/P: Stable. Continue current medications.   # allergies- pulmonary said continue singulair and flovent essentially. Patient states xyzal worsened symptoms- back on clartin. Ongoing cough -more sinus drainage over the last week- even considered taking steroid dose. Mild sore throat. Sinus congestion ongoing. Due to underlying adrenal suppression and his tendency toward hospitalization with illness we opted to go ahead and treat with a course of Augmentin to see if this will be beneficial-for 7  days   #b12 injection entered in error- patient was supposed to have b12 checked under high risk med use but patient did not receive injection   Osteopenia Related to long term Steroids -1.1 and -1.0 in hips. Vitamin d 1000 units and calcium through diet recommended. We will recheck this 2023 with dexa- im also concerned fosamax may worsen reflux and want to get him off PPI if possible for question of absorption issues   Recommended follow up: 3 to 16-month follow-up  Lab/Order associations:   ICD-10-CM   1. Steroid-induced hyperglycemia  R73.9 Hemoglobin A1c   T38.0X5A CBC    Comprehensive metabolic panel    CANCELED: Comprehensive metabolic panel  2. Essential hypertension  I10   3. Gastroesophageal reflux disease, unspecified whether esophagitis present  K21.9   4. Hyperlipidemia, unspecified hyperlipidemia type  E78.5 LDL cholesterol, direct  5. B12 deficiency  E53.8 cyanocobalamin ((VITAMIN B-12)) injection 1,000 mcg  6. De Quervain's tenosynovitis  M65.4 Ambulatory referral to Sports Medicine  7. History of CVA (cerebrovascular accident)  Z86.73   8. High risk medication use  Z79.899 Vitamin B12  9. Osteopenia of right hip  M85.851     Meds ordered this encounter  Medications  . cyanocobalamin ((VITAMIN B-12)) injection 1,000 mcg  . rosuvastatin (CRESTOR) 20 MG tablet    Sig: Take 1 tablet (20 mg total) by mouth daily.    Dispense:  90 tablet    Refill:  3  . omeprazole (PRILOSEC) 10 MG capsule    Sig: Take 1 capsule (10 mg total) by mouth daily.    Dispense:  30 capsule    Refill:  5  . amoxicillin-clavulanate (AUGMENTIN) 875-125 MG tablet    Sig: Take 1 tablet by mouth 2 (two) times daily.    Dispense:  14 tablet    Refill:  0   Time Spent: 49 yes she is almost entire minutes of total time (1:37 PM- 2:26 PM) was spent on the date of the encounter performing the following actions: chart review prior to seeing the patient, obtaining history, performing a medically  necessary exam, counseling on the treatment plan, placing orders, and documenting in our EHR.   Return precautions advised.  Garret Reddish, MD

## 2019-11-17 NOTE — Patient Instructions (Addendum)
Health Maintenance Due  Topic Date Due  . TETANUS/TDAP will check with [facility] to see if he needs.  12/06/2015  . PNA vac Low Risk Adult (2 of 2 - PPSV23) Will check with [facility] to see if needed.  08/27/2019    We will call you within two weeks about your referral to Sports Medicine. If you do not hear within 3 weeks, give [place] a call.   Increased Crestor to 20mg .  Decreased Omeprazole to 10mg .  Seek Care if symptoms worsen.

## 2019-11-18 ENCOUNTER — Other Ambulatory Visit: Payer: Medicare Other

## 2019-11-24 ENCOUNTER — Other Ambulatory Visit: Payer: Self-pay

## 2019-11-24 ENCOUNTER — Encounter: Payer: Self-pay | Admitting: Family Medicine

## 2019-11-24 ENCOUNTER — Ambulatory Visit (INDEPENDENT_AMBULATORY_CARE_PROVIDER_SITE_OTHER): Payer: Medicare Other | Admitting: Family Medicine

## 2019-11-24 VITALS — BP 122/68 | HR 74 | Temp 98.6°F | Ht 70.0 in | Wt 242.0 lb

## 2019-11-24 DIAGNOSIS — E785 Hyperlipidemia, unspecified: Secondary | ICD-10-CM | POA: Diagnosis not present

## 2019-11-24 DIAGNOSIS — R739 Hyperglycemia, unspecified: Secondary | ICD-10-CM | POA: Diagnosis not present

## 2019-11-24 DIAGNOSIS — I10 Essential (primary) hypertension: Secondary | ICD-10-CM

## 2019-11-24 DIAGNOSIS — T380X5A Adverse effect of glucocorticoids and synthetic analogues, initial encounter: Secondary | ICD-10-CM | POA: Diagnosis not present

## 2019-11-24 DIAGNOSIS — Z8673 Personal history of transient ischemic attack (TIA), and cerebral infarction without residual deficits: Secondary | ICD-10-CM

## 2019-11-24 DIAGNOSIS — K219 Gastro-esophageal reflux disease without esophagitis: Secondary | ICD-10-CM | POA: Diagnosis not present

## 2019-11-24 DIAGNOSIS — Z79899 Other long term (current) drug therapy: Secondary | ICD-10-CM

## 2019-11-24 DIAGNOSIS — E538 Deficiency of other specified B group vitamins: Secondary | ICD-10-CM | POA: Diagnosis not present

## 2019-11-24 DIAGNOSIS — M85851 Other specified disorders of bone density and structure, right thigh: Secondary | ICD-10-CM

## 2019-11-24 DIAGNOSIS — M654 Radial styloid tenosynovitis [de Quervain]: Secondary | ICD-10-CM

## 2019-11-24 MED ORDER — ROSUVASTATIN CALCIUM 20 MG PO TABS
20.0000 mg | ORAL_TABLET | Freq: Every day | ORAL | 3 refills | Status: DC
Start: 2019-11-24 — End: 2020-03-17

## 2019-11-24 MED ORDER — CYANOCOBALAMIN 1000 MCG/ML IJ SOLN
1000.0000 ug | Freq: Once | INTRAMUSCULAR | Status: DC
  Administered 2019-11-24: 1000 ug via INTRAMUSCULAR

## 2019-11-24 MED ORDER — AMOXICILLIN-POT CLAVULANATE 875-125 MG PO TABS
1.0000 | ORAL_TABLET | Freq: Two times a day (BID) | ORAL | 0 refills | Status: DC
Start: 2019-11-24 — End: 2020-01-05

## 2019-11-24 MED ORDER — OMEPRAZOLE 10 MG PO CPDR: 10.0000 mg | DELAYED_RELEASE_CAPSULE | Freq: Every day | ORAL | 5 refills | Status: DC

## 2019-11-24 NOTE — Assessment & Plan Note (Signed)
Related to long term Steroids -1.1 and -1.0 in hips. Vitamin d 1000 units and calcium through diet recommended. We will recheck this 2023 with dexa- im also concerned fosamax may worsen reflux and want to get him off PPI if possible for question of absorption issues

## 2019-11-27 ENCOUNTER — Encounter: Payer: Self-pay | Admitting: Family Medicine

## 2019-11-27 DIAGNOSIS — K862 Cyst of pancreas: Secondary | ICD-10-CM

## 2019-11-30 ENCOUNTER — Other Ambulatory Visit: Payer: Self-pay

## 2019-11-30 DIAGNOSIS — K862 Cyst of pancreas: Secondary | ICD-10-CM

## 2019-12-26 ENCOUNTER — Ambulatory Visit
Admission: RE | Admit: 2019-12-26 | Discharge: 2019-12-26 | Disposition: A | Discharge status: Home / Self Care | Payer: Medicare Other | Source: Ambulatory Visit | Attending: Family Medicine | Admitting: Family Medicine

## 2019-12-26 DIAGNOSIS — K862 Cyst of pancreas: Secondary | ICD-10-CM

## 2019-12-26 MED ORDER — GADOBENATE DIMEGLUMINE 529 MG/ML IV SOLN
20.0000 mL | Freq: Once | INTRAVENOUS | Status: AC | PRN
  Administered 2019-12-26: 20 mL via INTRAVENOUS

## 2019-12-29 ENCOUNTER — Encounter: Payer: Self-pay | Admitting: Family Medicine

## 2020-01-05 ENCOUNTER — Encounter: Payer: Self-pay | Admitting: Family Medicine

## 2020-01-05 ENCOUNTER — Other Ambulatory Visit: Payer: Self-pay

## 2020-01-05 ENCOUNTER — Telehealth (INDEPENDENT_AMBULATORY_CARE_PROVIDER_SITE_OTHER): Payer: Medicare Other | Admitting: Family Medicine

## 2020-01-05 DIAGNOSIS — K869 Disease of pancreas, unspecified: Secondary | ICD-10-CM | POA: Diagnosis not present

## 2020-01-05 DIAGNOSIS — K760 Fatty (change of) liver, not elsewhere classified: Secondary | ICD-10-CM

## 2020-01-05 NOTE — Patient Instructions (Addendum)
Health Maintenance Due  Topic Date Due  . TETANUS/TDAP  12/06/2015  . PNA vac Low Risk Adult (2 of 2 - PPSV23) 08/27/2019

## 2020-01-05 NOTE — Assessment & Plan Note (Signed)
We reviewed the following findings together and importance of 2-month follow-up.  Patient is also going to take a copy of the MRI to [facility] and their radiologist will review.  He reports transplant physician is also considering having him see liver specialist at [facility] due to fatty liver.  Apparently transplant team is okay with 23-month follow-up at present until further review by their radiologist.  I am happy to change plan if [facility] wants more aggressive follow-up  December 26, 2019 MRI "1. Finding in the lesser sac with multi cystic changes appears to represent a bilobed cystic area that may have enlarged slightly since 2019 and may show some peripheral enhancement along the inferior wall. Finding could represent sequela of prior pancreatitis if there is this history. There is no definite ductal communication on today's scan. Lymphangioma is also considered given this location. It does not appear to communicate with the stomach. 2. Multi cystic area in the pancreatic head not well seen on previous imaging may show ductal communication perhaps side branch intraductal papillary mucinous neoplasm with multiple dilated side branch elements but without main duct dilation versus is serous cystadenoma as ductal communication is difficult to determine given the proximity of the duct and the small cystic areas within this location. 3. For above findings would suggest a 6 month follow-up with MRI."

## 2020-01-05 NOTE — Progress Notes (Signed)
Phone [phone] Virtual visit via Video note   Subjective:  Chief complaint: Chief Complaint  Patient presents with  . virtual  . discuss MRI results   This visit type was conducted due to national recommendations for restrictions regarding the COVID-19 Pandemic (e.g. social distancing).  This format is felt to be most appropriate for this patient at this time balancing risks to patient and risks to population by having him in for in person visit.  No physical exam was performed (except for noted visual exam or audio findings with Telehealth visits).    Our team/I connected with Elisabeth Most at  4:00 PM EDT by a video enabled telemedicine application (doxy.me or caregility through epic) and verified that I am speaking with the correct person using two identifiers.  Location patient: [facility] Location provider: [facility], office Persons participating in the virtual visit:  patient  Our team/I discussed the limitations of evaluation and management by telemedicine and the availability of in person appointments. In light of current covid-19 pandemic, patient also understands that we are trying to protect them by minimizing in office contact if at all possible.  The patient expressed consent for telemedicine visit and agreed to proceed. Patient understands insurance will be billed.   Past Medical History-  Patient Active Problem List   Diagnosis Date Noted  . Allergic rhinitis 10/03/2019    Priority: High  . Pancreatic lesion 10/03/2019    Priority: High  . History of CVA (cerebrovascular accident) 03/10/2019    Priority: High  . Adrenocortical insufficiency ([place]) 08/06/2017    Priority: High  . GVHD (graft versus host disease) ([place]) 03/04/2017    Priority: High  . History of myelodysplastic syndrome 12/06/2015    Priority: High  . Steroid-induced hyperglycemia 08/16/2014    Priority: High  . Fatty liver 01/05/2020    Priority: Medium  . History of DVT (deep vein thrombosis)  09/25/2018    Priority: Medium  . Post chemotherapy Dry eyes due to decreased tear production 08/06/2017    Priority: Medium  . post chemotherapy Peripheral neuropathy 08/06/2017    Priority: Medium  . BPH associated with nocturia 11/15/2014    Priority: Medium  . Hyperlipidemia 02/04/2007    Priority: Medium  . Essential hypertension 02/04/2007    Priority: Medium  . History of stem cell transplant ([place]) 03/04/2017    Priority: Low  . Leukopenia 11/15/2014    Priority: Low  . Chest pain 08/13/2014    Priority: Low  . GERD (gastroesophageal reflux disease) 05/03/2014    Priority: Low  . Cervical disc disorder with radiculopathy of cervical region 07/14/2010    Priority: Low  . MIXED HEARING LOSS BILATERAL 07/14/2010    Priority: Low  . ERECTILE DYSFUNCTION 02/07/2007    Priority: Low  . Osteopenia 10/05/2019    Medications- reviewed and updated Current Outpatient Medications  Medication Sig Dispense Refill  . acyclovir (ZOVIRAX) 400 MG tablet Take 400 mg by mouth 2 (two) times daily.    Marland Kitchen albuterol (PROVENTIL HFA;VENTOLIN HFA) 108 (90 Base) MCG/ACT inhaler Inhale 1-2 puffs into the lungs every 6 (six) hours as needed for wheezing or shortness of breath.     Marland Kitchen amLODipine (NORVASC) 10 MG tablet Take 1 tablet (10 mg total) by mouth daily. 90 tablet 3  . aspirin EC 325 MG tablet Take 325 mg by mouth at bedtime.     . Cholecalciferol (VITAMIN D) 50 MCG (2000 UT) CAPS Take 2,000 Units by mouth at bedtime.     Marland Kitchen  CORTISONE ACETATE PO 10 mg.    . famotidine (PEPCID) 20 MG tablet Take 20 mg by mouth daily.    . fluticasone (FLONASE) 50 MCG/ACT nasal spray Place 2 sprays into both nostrils daily as needed for allergies or rhinitis.    . fluticasone (FLOVENT HFA) 220 MCG/ACT inhaler Inhale 2 puffs into the lungs 2 (two) times daily.     . hydrocortisone cream 1 % Apply 1 application topically as needed for itching.    . Lancets (ONETOUCH DELICA PLUS [id number]) MISC     . loratadine  (CLARITIN) 10 MG tablet Take 10 mg by mouth daily.    Marland Kitchen LORazepam (ATIVAN) 0.5 MG tablet Take 0.5 mg by mouth every 4 (four) hours as needed for anxiety.    . Magnesium Oxide 400 MG CAPS Take 1 capsule (400 mg total) by mouth 5 (five) times daily. (Patient taking differently: Take 400 mg by mouth daily. )    . metFORMIN (GLUCOPHAGE) 500 MG tablet Take 1 tablet (500 mg total) by mouth 2 (two) times daily with a meal. 180 tablet 3  . metoprolol tartrate (LOPRESSOR) 25 MG tablet Take 1 tablet by mouth 2 (two) times daily.    . montelukast (SINGULAIR) 10 MG tablet Take 10 mg by mouth daily.     . Multiple Vitamin (MULTI-VITAMIN) tablet Take by mouth.    Marland Kitchen omeprazole (PRILOSEC) 10 MG capsule Take 1 capsule (10 mg total) by mouth daily. 30 capsule 5  . ondansetron (ZOFRAN-ODT) 8 MG disintegrating tablet Take 8 mg by mouth every 8 (eight) hours as needed for nausea or vomiting.    . polyethylene glycol (MIRALAX) 17 g packet Take 1 packet by mouth daily as needed (constipation).     . polyvinyl alcohol (LIQUIFILM TEARS) 1.4 % ophthalmic solution Place 1 drop into both eyes as needed for dry eyes.    . rosuvastatin (CRESTOR) 20 MG tablet Take 1 tablet (20 mg total) by mouth daily. 90 tablet 3  . senna-docusate (SENOKOT-S) 8.6-50 MG tablet Take 2 tablets by mouth at bedtime.      Current Facility-Administered Medications  Medication Dose Route Frequency Provider Last Rate Last Admin  . cyanocobalamin ((VITAMIN B-12)) injection 1,000 mcg  1,000 mcg Intramuscular Once [name], MD         Objective:  Ht 5\' 10"  (1.778 m)   Wt 235 lb (106.6 kg)   BMI 33.72 kg/m  self reported vitals Gen: NAD, resting comfortably Lungs: nonlabored, normal respiratory rate  Skin: appears dry, no obvious rash     Assessment and Plan    Fatty liver From recent MRI "Hepatobiliary: Probable background hepatic steatosis based on T2 liver signal. In and out of phase assessment markedly limited secondary to  signal loss."  We discussed diagnosis of fatty liver and importance of healthy eating/regular exercise/weight loss.  LFTs have been reassuring and will check at least every 6 to 12 months  Pancreatic lesion We reviewed the following findings together and importance of 29-month follow-up.  Patient is also going to take a copy of the MRI to [facility] and their radiologist will review.  He reports transplant physician is also considering having him see liver specialist at [facility] due to fatty liver.  Apparently transplant team is okay with 2-month follow-up at present until further review by their radiologist.  I am happy to change plan if [facility] wants more aggressive follow-up  December 26, 2019 MRI "1. Finding in the lesser sac with multi cystic changes appears  to represent a bilobed cystic area that may have enlarged slightly since 2019 and may show some peripheral enhancement along the inferior wall. Finding could represent sequela of prior pancreatitis if there is this history. There is no definite ductal communication on today's scan. Lymphangioma is also considered given this location. It does not appear to communicate with the stomach. 2. Multi cystic area in the pancreatic head not well seen on previous imaging may show ductal communication perhaps side branch intraductal papillary mucinous neoplasm with multiple dilated side branch elements but without main duct dilation versus is serous cystadenoma as ductal communication is difficult to determine given the proximity of the duct and the small cystic areas within this location. 3. For above findings would suggest a 6 month follow-up with MRI."    Recommended follow up: September visit Future Appointments  Date Time Provider [place]  02/17/2020  8:30 AM [facility]  02/25/2020  9:20 AM Yong Channel, Brayton Mars, MD [facility]   Lab/Order associations:   ICD-10-CM   1. Fatty liver  K76.0   2. Pancreatic lesion  K86.9     Time  Spent: 21 minutes of total time (4:19 PM- 4:40 PM) was spent on the date of the encounter performing the following actions: chart review prior to seeing the patient, obtaining history, performing a medically necessary exam, counseling on the treatment plan, placing orders, and documenting in our EHR.   Return precautions advised.  Garret Reddish, MD

## 2020-01-05 NOTE — Assessment & Plan Note (Addendum)
From recent MRI "Hepatobiliary: Probable background hepatic steatosis based on T2 liver signal. In and out of phase assessment markedly limited secondary to signal loss."  We discussed diagnosis of fatty liver and importance of healthy eating/regular exercise/weight loss.  LFTs have been reassuring and will check at least every 6 to 12 months

## 2020-02-03 DIAGNOSIS — E2740 Unspecified adrenocortical insufficiency: Secondary | ICD-10-CM | POA: Diagnosis not present

## 2020-02-10 DIAGNOSIS — Z23 Encounter for immunization: Secondary | ICD-10-CM | POA: Diagnosis not present

## 2020-02-17 ENCOUNTER — Encounter: Payer: Self-pay | Admitting: Family Medicine

## 2020-02-17 ENCOUNTER — Other Ambulatory Visit: Payer: Medicare Other

## 2020-02-17 ENCOUNTER — Other Ambulatory Visit: Payer: Self-pay

## 2020-02-17 DIAGNOSIS — E785 Hyperlipidemia, unspecified: Secondary | ICD-10-CM

## 2020-02-17 DIAGNOSIS — Z79899 Other long term (current) drug therapy: Secondary | ICD-10-CM

## 2020-02-17 DIAGNOSIS — T380X5A Adverse effect of glucocorticoids and synthetic analogues, initial encounter: Secondary | ICD-10-CM

## 2020-02-17 NOTE — Addendum Note (Signed)
Addended by: Liliane Channel on: 02/17/2020 08:53 AM   Modules accepted: Orders

## 2020-02-18 LAB — CBC
HCT: 42.8 % (ref 38.5–50.0)
Hemoglobin: 14.1 g/dL (ref 13.2–17.1)
MCH: 29.9 pg (ref 27.0–33.0)
MCHC: 32.9 g/dL (ref 32.0–36.0)
MCV: 90.9 fL (ref 80.0–100.0)
MPV: 8.7 fL (ref 7.5–12.5)
Platelets: 356 10*3/uL (ref 140–400)
RBC: 4.71 10*6/uL (ref 4.20–5.80)
RDW: 15.3 % — ABNORMAL HIGH (ref 11.0–15.0)
WBC: 19.4 10*3/uL — ABNORMAL HIGH (ref 3.8–10.8)

## 2020-02-18 LAB — COMPREHENSIVE METABOLIC PANEL
AG Ratio: 1.4 (calc) (ref 1.0–2.5)
ALT: 23 U/L (ref 9–46)
AST: 19 U/L (ref 10–35)
Albumin: 3.9 g/dL (ref 3.6–5.1)
Alkaline phosphatase (APISO): 92 U/L (ref 35–144)
BUN: 15 mg/dL (ref 7–25)
CO2: 23 mmol/L (ref 20–32)
Calcium: 9 mg/dL (ref 8.6–10.3)
Chloride: 103 mmol/L (ref 98–110)
Creat: 1.03 mg/dL (ref 0.70–1.18)
Globulin: 2.8 g/dL (calc) (ref 1.9–3.7)
Glucose, Bld: 114 mg/dL — ABNORMAL HIGH (ref 65–99)
Potassium: 4.1 mmol/L (ref 3.5–5.3)
Sodium: 136 mmol/L (ref 135–146)
Total Bilirubin: 0.5 mg/dL (ref 0.2–1.2)
Total Protein: 6.7 g/dL (ref 6.1–8.1)

## 2020-02-18 LAB — HEMOGLOBIN A1C
Hgb A1c MFr Bld: 6.2 % of total Hgb — ABNORMAL HIGH (ref <5.7)
Mean Plasma Glucose: 131 (calc)
eAG (mmol/L): 7.3 (calc)

## 2020-02-18 LAB — VITAMIN B12: Vitamin B-12: 554 pg/mL (ref 200–1100)

## 2020-02-18 LAB — LDL CHOLESTEROL, DIRECT: Direct LDL: 77 mg/dL (ref <100)

## 2020-02-18 NOTE — Telephone Encounter (Signed)
Spoke to pt, see pt message My Chart.

## 2020-02-25 ENCOUNTER — Ambulatory Visit: Payer: Medicare Other | Admitting: Family Medicine

## 2020-03-11 ENCOUNTER — Encounter: Payer: Self-pay | Admitting: Family Medicine

## 2020-03-17 ENCOUNTER — Ambulatory Visit (INDEPENDENT_AMBULATORY_CARE_PROVIDER_SITE_OTHER): Payer: Medicare Other | Admitting: Family Medicine

## 2020-03-17 ENCOUNTER — Encounter: Payer: Self-pay | Admitting: Family Medicine

## 2020-03-17 ENCOUNTER — Other Ambulatory Visit: Payer: Self-pay

## 2020-03-17 VITALS — BP 124/78 | HR 78 | Temp 98.5°F | Resp 18 | Ht 70.0 in | Wt 221.6 lb

## 2020-03-17 DIAGNOSIS — R739 Hyperglycemia, unspecified: Secondary | ICD-10-CM

## 2020-03-17 DIAGNOSIS — K219 Gastro-esophageal reflux disease without esophagitis: Secondary | ICD-10-CM | POA: Diagnosis not present

## 2020-03-17 DIAGNOSIS — K9189 Other postprocedural complications and disorders of digestive system: Secondary | ICD-10-CM

## 2020-03-17 DIAGNOSIS — Z23 Encounter for immunization: Secondary | ICD-10-CM | POA: Diagnosis not present

## 2020-03-17 DIAGNOSIS — T380X5A Adverse effect of glucocorticoids and synthetic analogues, initial encounter: Secondary | ICD-10-CM

## 2020-03-17 DIAGNOSIS — K859 Acute pancreatitis without necrosis or infection, unspecified: Secondary | ICD-10-CM | POA: Diagnosis not present

## 2020-03-17 DIAGNOSIS — R200 Anesthesia of skin: Secondary | ICD-10-CM

## 2020-03-17 DIAGNOSIS — I10 Essential (primary) hypertension: Secondary | ICD-10-CM | POA: Diagnosis not present

## 2020-03-17 DIAGNOSIS — E785 Hyperlipidemia, unspecified: Secondary | ICD-10-CM

## 2020-03-17 MED ORDER — ROSUVASTATIN CALCIUM 40 MG PO TABS
40.0000 mg | ORAL_TABLET | Freq: Every day | ORAL | 3 refills | Status: DC
Start: 2020-03-17 — End: 2021-02-13

## 2020-03-17 NOTE — Patient Instructions (Addendum)
Health Maintenance Due  Topic Date Due  . TETANUS/TDAP - would need this if cut/scrape 12/06/2015  . INFLUENZA VACCINE today 01/24/2020    Please stop by lab before you go If you have mychart- we will send your results within 3 business days of [place] receiving them.  If you do not have mychart- we will call you about results within 5 business days of [place] receiving them.  *please note we are currently using [facility] which has a longer processing time than [place] typically so labs may not come back as quickly as in the past *please also note that you will see labs on mychart as soon as they post. I will later go in and write notes on them- will say "notes from Dr. Yong Channel"  November 26th or later or ok if even falls into January

## 2020-03-17 NOTE — Progress Notes (Signed)
Phone [phone] In person visit   Subjective:   Brandon [name] is a 69 y.o. year old very pleasant male patient who presents for/with See problem oriented charting Chief Complaint  Patient presents with  . Hyperlipidemia  . Gastroesophageal Reflux  . Hypertension   This visit occurred during the SARS-CoV-2 public health emergency.  Safety protocols were in place, including screening questions prior to the visit, additional usage of staff PPE, and extensive cleaning of exam room while observing appropriate contact time as indicated for disinfecting solutions.   Past Medical History-  Patient Active Problem List   Diagnosis Date Noted  . Allergic rhinitis 10/03/2019    Priority: High  . Pancreatic lesion 10/03/2019    Priority: High  . History of CVA (cerebrovascular accident) 03/10/2019    Priority: High  . Adrenocortical insufficiency ([place]) 08/06/2017    Priority: High  . GVHD (graft versus host disease) ([place]) 03/04/2017    Priority: High  . History of myelodysplastic syndrome 12/06/2015    Priority: High  . Steroid-induced hyperglycemia 08/16/2014    Priority: High  . Fatty liver 01/05/2020    Priority: Medium  . History of DVT (deep vein thrombosis) 09/25/2018    Priority: Medium  . Post chemotherapy Dry eyes due to decreased tear production 08/06/2017    Priority: Medium  . post chemotherapy Peripheral neuropathy 08/06/2017    Priority: Medium  . BPH associated with nocturia 11/15/2014    Priority: Medium  . Hyperlipidemia 02/04/2007    Priority: Medium  . Essential hypertension 02/04/2007    Priority: Medium  . History of stem cell transplant ([place]) 03/04/2017    Priority: Low  . Leukopenia 11/15/2014    Priority: Low  . Chest pain 08/13/2014    Priority: Low  . GERD (gastroesophageal reflux disease) 05/03/2014    Priority: Low  . Cervical disc disorder with radiculopathy of cervical region 07/14/2010    Priority: Low  . MIXED HEARING LOSS BILATERAL  07/14/2010    Priority: Low  . ERECTILE DYSFUNCTION 02/07/2007    Priority: Low  . Osteopenia 10/05/2019    Medications- reviewed and updated Current Outpatient Medications  Medication Sig Dispense Refill  . acyclovir (ZOVIRAX) 400 MG tablet Take 400 mg by mouth 2 (two) times daily.    Marland Kitchen amLODipine (NORVASC) 10 MG tablet Take 1 tablet (10 mg total) by mouth daily. 90 tablet 3  . aspirin EC 81 MG tablet Take 81 mg by mouth daily. Swallow whole.    . Cholecalciferol (VITAMIN D) 50 MCG (2000 UT) CAPS Take 2,000 Units by mouth at bedtime.     . CORTISONE ACETATE PO 10 mg.    . famotidine (PEPCID) 20 MG tablet Take 20 mg by mouth daily.    . hydrocortisone cream 1 % Apply 1 application topically as needed for itching.    . Lancets (ONETOUCH DELICA PLUS [id number]) MISC     . loratadine (CLARITIN) 10 MG tablet Take 10 mg by mouth daily.    Marland Kitchen LORazepam (ATIVAN) 0.5 MG tablet Take 0.5 mg by mouth every 4 (four) hours as needed for anxiety.    . Magnesium Oxide 400 MG CAPS Take 1 capsule (400 mg total) by mouth 5 (five) times daily. (Patient taking differently: Take 400 mg by mouth daily. )    . melatonin 3 MG TABS tablet Take 3 mg by mouth at bedtime as needed.    . metFORMIN (GLUCOPHAGE) 500 MG tablet Take 1 tablet (500 mg total) by mouth 2 (  two) times daily with a meal. 180 tablet 3  . metoprolol tartrate (LOPRESSOR) 25 MG tablet Take 1 tablet by mouth 2 (two) times daily.    . montelukast (SINGULAIR) 10 MG tablet Take 10 mg by mouth daily.     . Multiple Vitamin (MULTI-VITAMIN) tablet Take by mouth.    Marland Kitchen omeprazole (PRILOSEC) 10 MG capsule Take 1 capsule (10 mg total) by mouth daily. 30 capsule 5  . ondansetron (ZOFRAN-ODT) 8 MG disintegrating tablet Take 8 mg by mouth every 8 (eight) hours as needed for nausea or vomiting.    . polyethylene glycol (MIRALAX) 17 g packet Take 1 packet by mouth daily as needed (constipation).     . polyvinyl alcohol (LIQUIFILM TEARS) 1.4 % ophthalmic solution  Place 1 drop into both eyes as needed for dry eyes.    . rosuvastatin (CRESTOR) 20 MG tablet Take 1 tablet (20 mg total) by mouth daily. 90 tablet 3  . senna-docusate (SENOKOT-S) 8.6-50 MG tablet Take 2 tablets by mouth at bedtime.      Current Facility-Administered Medications  Medication Dose Route Frequency Provider Last Rate Last Admin  . cyanocobalamin ((VITAMIN B-12)) injection 1,000 mcg  1,000 mcg Intramuscular Once [name], MD         Objective:  BP 124/78   Pulse 78   Temp 98.5 F (36.9 C) (Temporal)   Resp 18   Ht 5\' 10"  (1.778 m)   Wt 221 lb 9.6 oz (100.5 kg)   SpO2 95%   BMI 31.80 kg/m  Gen: NAD, resting comfortably CV: RRR no murmurs rubs or gallops Lungs: CTAB no crackles, wheeze, rhonchi Abdomen: soft/nontender/nondistended/normal bowel sounds.  Ext: trace edema Skin: warm, dry    Assessment and Plan   #Hospitalization recap-patient hospitalized from February 18, 2020 to February 26, 2020 after being found to have a fever after ERCP for biopsy of pancreatic mass.  Initially started on Vanco and Zosyn.  Blood and urine cultures were negative.  Chest x-ray with low lung volume and bibasilar atelectasis.  CT of the abdomen consistent with post ERCP pancreatitis-despite prophylactic Levaquin and higher dose steroids day before and after procedure.  Amylase and lipase were normal as well as lactate.  Initially was started on stress dose steroids but later transition back to his baseline steroids before being restarted on stress dose steroids on August 28 due to recurrent fever.  Had a KUB concerning for pneumoperitoneum but repeat on August 28 was negative.  Repeat CT scan of abdomen pelvis on August 30 for pancreatitis and increased peripancreatic fluid.  Plan was for 10 to 14 days of antibiotics-transition to Augmentin by September 1 and plan to complete 14-day course.  Resumed on 20 mg / 10 mg hydrocortisone September 2 but later had to go back to 50 mg twice daily  he is back to 30mg /20mg  now with plans to adjust 25/10 on sunday.  Patient was also started on fluconazole per infectious disease for 21 days through March 10, 2020 due to Candida being seen on esophageal pathology  #Steroid-induced hyperglycemia S: Medication:Metformin 500mg  BID was held during hospitalization with plans to stay off due to potential pancreatic stres. A1c looked great last visit CBGs- 85-95 in morning.  Exercise and diet- he plans to try to improve diet after pancreatitis Lab Results  Component Value Date   HGBA1C 6.2 (H) 02/17/2020   HGBA1C 6.7 (H) 11/16/2019   HGBA1C 6.6 (H) 07/22/2019   A/P: he would like to stay off metformin  and see how levels look- will recheck after 05/20/20   #hyperlipidemia S: Medication: Rosuvastatin 20Mg  Lab Results  Component Value Date   CHOL 146 04/14/2019   HDL 53.00 04/14/2019   LDLCALC 55 04/14/2019   LDLDIRECT 77 02/17/2020   TRIG 191.0 (H) 04/14/2019   CHOLHDL 3 04/14/2019   A/P: increased rosuvastatin 20 mg within last year but LDL still 77- increase to 40mg   # GERD S:Medication: Prilosec 10Mg , pepcid at night B12 levels related to PPI use: Lab Results  Component Value Date   XBOERQSX28 208 02/17/2020  A/P: no significant breakthrough despite lower dose.    #hypertension S: medication: Metoprolol 25Mg  BID, amlodipine 10Mg  Home readings #s: excellent on home checks BP Readings from Last 3 Encounters:  03/17/20 124/78  11/24/19 122/68  10/01/19 124/78  A/P: Stable. Continue current medications.   # numbness in fingertips S: includes all fingers. Going on about 2 months. [facility] said potentially related to prior chemotherapy. Dropping objects occasionally. No pain.   A/P: offered neurology visit and he declines for now but will let me know if symptoms worsen   Recommended follow up: No follow-ups on file.  Lab/Order associations:   ICD-10-CM   1. Essential hypertension  I10   2. Gastroesophageal reflux disease,  unspecified whether esophagitis present  K21.9   3. Hyperlipidemia, unspecified hyperlipidemia type  E78.5   4. Steroid-induced hyperglycemia  R73.9 Microalbumin / creatinine urine ratio   T38.0X5A    Time Spent: 41 minutes of total time (1:08 PM- 1:49 PM) was spent on the date of the encounter performing the following actions: chart review prior to seeing the patient, obtaining history, performing a medically necessary exam, counseling on the treatment plan, placing orders, and documenting in our EHR.   Return precautions advised.  Garret Reddish, MD

## 2020-03-17 NOTE — Addendum Note (Signed)
Addended by: Thomes Cake on: 03/17/2020 02:05 PM   Modules accepted: Orders

## 2020-03-18 ENCOUNTER — Other Ambulatory Visit: Payer: Self-pay | Admitting: Family Medicine

## 2020-03-18 LAB — CBC WITH DIFFERENTIAL/PLATELET
Absolute Monocytes: 833 cells/uL (ref 200–950)
Basophils Absolute: 48 cells/uL (ref 0–200)
Basophils Relative: 0.4 %
Eosinophils Absolute: 107 cells/uL (ref 15–500)
Eosinophils Relative: 0.9 %
HCT: 42.5 % (ref 38.5–50.0)
Hemoglobin: 13.9 g/dL (ref 13.2–17.1)
Lymphs Abs: 4927 cells/uL — ABNORMAL HIGH (ref 850–3900)
MCH: 29.5 pg (ref 27.0–33.0)
MCHC: 32.7 g/dL (ref 32.0–36.0)
MCV: 90.2 fL (ref 80.0–100.0)
MPV: 8.6 fL (ref 7.5–12.5)
Monocytes Relative: 7 %
Neutro Abs: 5986 cells/uL (ref 1500–7800)
Neutrophils Relative %: 50.3 %
Platelets: 381 10*3/uL (ref 140–400)
RBC: 4.71 10*6/uL (ref 4.20–5.80)
RDW: 14.4 % (ref 11.0–15.0)
Total Lymphocyte: 41.4 %
WBC: 11.9 10*3/uL — ABNORMAL HIGH (ref 3.8–10.8)

## 2020-03-18 LAB — COMPLETE METABOLIC PANEL WITH GFR
AG Ratio: 1.1 (calc) (ref 1.0–2.5)
ALT: 43 U/L (ref 9–46)
AST: 28 U/L (ref 10–35)
Albumin: 3.7 g/dL (ref 3.6–5.1)
Alkaline phosphatase (APISO): 147 U/L — ABNORMAL HIGH (ref 35–144)
BUN: 15 mg/dL (ref 7–25)
CO2: 27 mmol/L (ref 20–32)
Calcium: 9.4 mg/dL (ref 8.6–10.3)
Chloride: 102 mmol/L (ref 98–110)
Creat: 1.06 mg/dL (ref 0.70–1.18)
GFR, Est African American: 82 mL/min/{1.73_m2} (ref >=60)
GFR, Est Non African American: 71 mL/min/{1.73_m2} (ref >=60)
Globulin: 3.3 g/dL (calc) (ref 1.9–3.7)
Glucose, Bld: 187 mg/dL — ABNORMAL HIGH (ref 65–99)
Potassium: 4.6 mmol/L (ref 3.5–5.3)
Sodium: 138 mmol/L (ref 135–146)
Total Bilirubin: 0.4 mg/dL (ref 0.2–1.2)
Total Protein: 7 g/dL (ref 6.1–8.1)

## 2020-03-18 LAB — MICROALBUMIN / CREATININE URINE RATIO
Creatinine, Urine: 61 mg/dL (ref 20–320)
Microalb Creat Ratio: 48 mcg/mg creat — ABNORMAL HIGH (ref <30)
Microalb, Ur: 2.9 mg/dL

## 2020-03-18 MED ORDER — IRBESARTAN 75 MG PO TABS: 37.5000 mg | ORAL_TABLET | Freq: Every day | ORAL | 3 refills | Status: DC

## 2020-03-22 ENCOUNTER — Other Ambulatory Visit: Payer: Self-pay

## 2020-03-22 DIAGNOSIS — I10 Essential (primary) hypertension: Secondary | ICD-10-CM

## 2020-03-30 DIAGNOSIS — E7800 Pure hypercholesterolemia, unspecified: Secondary | ICD-10-CM | POA: Diagnosis not present

## 2020-03-30 DIAGNOSIS — B3781 Candidal esophagitis: Secondary | ICD-10-CM | POA: Diagnosis not present

## 2020-03-30 DIAGNOSIS — D378 Neoplasm of uncertain behavior of other specified digestive organs: Secondary | ICD-10-CM | POA: Diagnosis not present

## 2020-03-30 DIAGNOSIS — T380X5A Adverse effect of glucocorticoids and synthetic analogues, initial encounter: Secondary | ICD-10-CM | POA: Diagnosis not present

## 2020-03-30 DIAGNOSIS — M1990 Unspecified osteoarthritis, unspecified site: Secondary | ICD-10-CM | POA: Diagnosis not present

## 2020-03-30 DIAGNOSIS — Z8673 Personal history of transient ischemic attack (TIA), and cerebral infarction without residual deficits: Secondary | ICD-10-CM | POA: Diagnosis not present

## 2020-03-30 DIAGNOSIS — R739 Hyperglycemia, unspecified: Secondary | ICD-10-CM | POA: Diagnosis not present

## 2020-03-30 DIAGNOSIS — K2100 Gastro-esophageal reflux disease with esophagitis, without bleeding: Secondary | ICD-10-CM | POA: Diagnosis not present

## 2020-03-30 DIAGNOSIS — C9200 Acute myeloblastic leukemia, not having achieved remission: Secondary | ICD-10-CM | POA: Diagnosis not present

## 2020-03-30 DIAGNOSIS — K862 Cyst of pancreas: Secondary | ICD-10-CM | POA: Diagnosis not present

## 2020-03-30 DIAGNOSIS — Z79899 Other long term (current) drug therapy: Secondary | ICD-10-CM | POA: Diagnosis not present

## 2020-03-30 DIAGNOSIS — I10 Essential (primary) hypertension: Secondary | ICD-10-CM | POA: Diagnosis not present

## 2020-03-30 DIAGNOSIS — D469 Myelodysplastic syndrome, unspecified: Secondary | ICD-10-CM | POA: Diagnosis not present

## 2020-03-30 DIAGNOSIS — G4733 Obstructive sleep apnea (adult) (pediatric): Secondary | ICD-10-CM | POA: Diagnosis not present

## 2020-03-30 DIAGNOSIS — Z9484 Stem cells transplant status: Secondary | ICD-10-CM | POA: Diagnosis not present

## 2020-03-30 DIAGNOSIS — Z86718 Personal history of other venous thrombosis and embolism: Secondary | ICD-10-CM | POA: Diagnosis not present

## 2020-03-30 DIAGNOSIS — E2740 Unspecified adrenocortical insufficiency: Secondary | ICD-10-CM | POA: Diagnosis not present

## 2020-04-21 ENCOUNTER — Other Ambulatory Visit: Payer: Self-pay

## 2020-04-21 ENCOUNTER — Telehealth: Payer: Self-pay

## 2020-04-21 ENCOUNTER — Other Ambulatory Visit: Payer: Medicare Other

## 2020-04-21 DIAGNOSIS — R351 Nocturia: Secondary | ICD-10-CM

## 2020-04-21 DIAGNOSIS — I10 Essential (primary) hypertension: Secondary | ICD-10-CM

## 2020-04-21 LAB — COMPLETE METABOLIC PANEL WITH GFR
AG Ratio: 1.1 (calc) (ref 1.0–2.5)
ALT: 22 U/L (ref 9–46)
AST: 18 U/L (ref 10–35)
Albumin: 3.5 g/dL — ABNORMAL LOW (ref 3.6–5.1)
Alkaline phosphatase (APISO): 120 U/L (ref 35–144)
BUN: 13 mg/dL (ref 7–25)
CO2: 26 mmol/L (ref 20–32)
Calcium: 8.9 mg/dL (ref 8.6–10.3)
Chloride: 103 mmol/L (ref 98–110)
Creat: 1.03 mg/dL (ref 0.70–1.18)
GFR, Est African American: 85 mL/min/{1.73_m2} (ref >=60)
GFR, Est Non African American: 73 mL/min/{1.73_m2} (ref >=60)
Globulin: 3.1 g/dL (calc) (ref 1.9–3.7)
Glucose, Bld: 88 mg/dL (ref 65–99)
Potassium: 4.3 mmol/L (ref 3.5–5.3)
Sodium: 137 mmol/L (ref 135–146)
Total Bilirubin: 0.5 mg/dL (ref 0.2–1.2)
Total Protein: 6.6 g/dL (ref 6.1–8.1)

## 2020-04-21 LAB — CBC WITH DIFFERENTIAL/PLATELET
Absolute Monocytes: 1480 cells/uL — ABNORMAL HIGH (ref 200–950)
Basophils Absolute: 55 cells/uL (ref 0–200)
Basophils Relative: 0.4 %
Eosinophils Absolute: 233 cells/uL (ref 15–500)
Eosinophils Relative: 1.7 %
HCT: 41.3 % (ref 38.5–50.0)
Hemoglobin: 13.1 g/dL — ABNORMAL LOW (ref 13.2–17.1)
Lymphs Abs: 6768 cells/uL — ABNORMAL HIGH (ref 850–3900)
MCH: 28.1 pg (ref 27.0–33.0)
MCHC: 31.7 g/dL — ABNORMAL LOW (ref 32.0–36.0)
MCV: 88.6 fL (ref 80.0–100.0)
MPV: 8.7 fL (ref 7.5–12.5)
Monocytes Relative: 10.8 %
Neutro Abs: 5165 cells/uL (ref 1500–7800)
Neutrophils Relative %: 37.7 %
Platelets: 439 10*3/uL — ABNORMAL HIGH (ref 140–400)
RBC: 4.66 10*6/uL (ref 4.20–5.80)
RDW: 14.3 % (ref 11.0–15.0)
Total Lymphocyte: 49.4 %
WBC: 13.7 10*3/uL — ABNORMAL HIGH (ref 3.8–10.8)

## 2020-04-21 NOTE — Telephone Encounter (Signed)
Spoke with the patient and he stated that he does urinate at night. Lab order has been placed.

## 2020-04-21 NOTE — Telephone Encounter (Signed)
Please verify he pees at least once a night and then can order under nocturia

## 2020-04-21 NOTE — Telephone Encounter (Signed)
Ok to add PSA lab? Please advise

## 2020-04-21 NOTE — Telephone Encounter (Signed)
Pt had lab draw PSA. Can you place orders?

## 2020-04-22 LAB — PSA: PSA: 2.29 ng/mL (ref <=4.0)

## 2020-04-28 ENCOUNTER — Other Ambulatory Visit: Payer: Self-pay | Admitting: Family Medicine

## 2020-04-29 ENCOUNTER — Encounter: Payer: Self-pay | Admitting: Family Medicine

## 2020-05-02 ENCOUNTER — Other Ambulatory Visit: Payer: Self-pay

## 2020-05-02 DIAGNOSIS — D72829 Elevated white blood cell count, unspecified: Secondary | ICD-10-CM

## 2020-05-09 ENCOUNTER — Other Ambulatory Visit: Payer: Self-pay

## 2020-05-09 ENCOUNTER — Ambulatory Visit (INDEPENDENT_AMBULATORY_CARE_PROVIDER_SITE_OTHER): Payer: Medicare Other

## 2020-05-09 ENCOUNTER — Other Ambulatory Visit: Payer: Medicare Other

## 2020-05-09 ENCOUNTER — Encounter: Payer: Self-pay | Admitting: Family Medicine

## 2020-05-09 DIAGNOSIS — Z23 Encounter for immunization: Secondary | ICD-10-CM | POA: Diagnosis not present

## 2020-05-09 DIAGNOSIS — D72829 Elevated white blood cell count, unspecified: Secondary | ICD-10-CM

## 2020-05-09 LAB — CBC WITH DIFFERENTIAL/PLATELET
Absolute Monocytes: 1591 cells/uL — ABNORMAL HIGH (ref 200–950)
Basophils Absolute: 102 cells/uL (ref 0–200)
Basophils Relative: 0.7 %
Eosinophils Absolute: 234 cells/uL (ref 15–500)
Eosinophils Relative: 1.6 %
HCT: 39.6 % (ref 38.5–50.0)
Hemoglobin: 12.7 g/dL — ABNORMAL LOW (ref 13.2–17.1)
Lymphs Abs: 5314 cells/uL — ABNORMAL HIGH (ref 850–3900)
MCH: 28.1 pg (ref 27.0–33.0)
MCHC: 32.1 g/dL (ref 32.0–36.0)
MCV: 87.6 fL (ref 80.0–100.0)
MPV: 8.6 fL (ref 7.5–12.5)
Monocytes Relative: 10.9 %
Neutro Abs: 7358 cells/uL (ref 1500–7800)
Neutrophils Relative %: 50.4 %
Platelets: 453 10*3/uL — ABNORMAL HIGH (ref 140–400)
RBC: 4.52 10*6/uL (ref 4.20–5.80)
RDW: 14.5 % (ref 11.0–15.0)
Total Lymphocyte: 36.4 %
WBC: 14.6 10*3/uL — ABNORMAL HIGH (ref 3.8–10.8)

## 2020-05-09 NOTE — Progress Notes (Signed)
Patient came into to receive his pneuovax 23 vaccine. He received the vaccine in his left arm. No other questions or concerns at this time.

## 2020-05-09 NOTE — Progress Notes (Signed)
I have reviewed and agree with note, evaluation, plan.   Shaka Cardin, MD  

## 2020-05-11 ENCOUNTER — Other Ambulatory Visit: Payer: Self-pay

## 2020-05-11 DIAGNOSIS — D72829 Elevated white blood cell count, unspecified: Secondary | ICD-10-CM

## 2020-05-12 ENCOUNTER — Other Ambulatory Visit: Payer: Self-pay | Admitting: Family Medicine

## 2020-06-03 ENCOUNTER — Encounter: Payer: Self-pay | Admitting: Family Medicine

## 2020-06-07 ENCOUNTER — Other Ambulatory Visit: Payer: Self-pay

## 2020-06-07 ENCOUNTER — Other Ambulatory Visit: Payer: Medicare Other

## 2020-06-07 DIAGNOSIS — K862 Cyst of pancreas: Secondary | ICD-10-CM

## 2020-06-07 DIAGNOSIS — D72829 Elevated white blood cell count, unspecified: Secondary | ICD-10-CM

## 2020-06-07 LAB — CBC WITH DIFFERENTIAL/PLATELET
Absolute Monocytes: 1485 cells/uL — ABNORMAL HIGH (ref 200–950)
Basophils Absolute: 81 cells/uL (ref 0–200)
Basophils Relative: 0.8 %
Eosinophils Absolute: 283 cells/uL (ref 15–500)
Eosinophils Relative: 2.8 %
HCT: 38.7 % (ref 38.5–50.0)
Hemoglobin: 12.6 g/dL — ABNORMAL LOW (ref 13.2–17.1)
Lymphs Abs: 5595 cells/uL — ABNORMAL HIGH (ref 850–3900)
MCH: 28.8 pg (ref 27.0–33.0)
MCHC: 32.6 g/dL (ref 32.0–36.0)
MCV: 88.4 fL (ref 80.0–100.0)
MPV: 8.8 fL (ref 7.5–12.5)
Monocytes Relative: 14.7 %
Neutro Abs: 2656 cells/uL (ref 1500–7800)
Neutrophils Relative %: 26.3 %
Platelets: 395 10*3/uL (ref 140–400)
RBC: 4.38 10*6/uL (ref 4.20–5.80)
RDW: 15.6 % — ABNORMAL HIGH (ref 11.0–15.0)
Total Lymphocyte: 55.4 %
WBC: 10.1 10*3/uL (ref 3.8–10.8)

## 2020-06-07 NOTE — Telephone Encounter (Signed)
Patient was here this morning for bloodwork. Lipase lab was drawn in addition to other labs that were ordered. No other questions or concerns at this time.

## 2020-06-08 LAB — LIPASE: Lipase: 22 U/L (ref 7–60)

## 2020-06-08 LAB — EPSTEIN-BARR VIRUS NUCLEAR ANTIGEN ANTIBODY, IGG: EBV NA IgG: 298 U/mL — ABNORMAL HIGH

## 2020-06-13 NOTE — Patient Instructions (Addendum)
Health Maintenance Due  Topic Date Due  . TETANUS/TDAP Cheaper to receive at your local pharmacy.  But lets focus on MMR first and then consider this at least a month later 12/06/2015   Schedule labs a few days before 3 month visit  No changes today- glad you are doing well overall

## 2020-06-13 NOTE — Progress Notes (Signed)
Phone [phone] In person visit   Subjective:   Brandon [name] is a 69 y.o. year old very pleasant male patient who presents for/with See problem oriented charting Chief Complaint  Patient presents with  . Gastroesophageal Reflux  . Hyperlipidemia  . Hypertension   This visit occurred during the SARS-CoV-2 public health emergency.  Safety protocols were in place, including screening questions prior to the visit, additional usage of staff PPE, and extensive cleaning of exam room while observing appropriate contact time as indicated for disinfecting solutions.   Past Medical History-  Patient Active Problem List   Diagnosis Date Noted  . Allergic rhinitis 10/03/2019    Priority: High  . Pancreatic lesion 10/03/2019    Priority: High  . History of CVA (cerebrovascular accident) 03/10/2019    Priority: High  . Adrenocortical insufficiency ([place]) 08/06/2017    Priority: High  . GVHD (graft versus host disease) ([place]) 03/04/2017    Priority: High  . History of myelodysplastic syndrome 12/06/2015    Priority: High  . Steroid-induced hyperglycemia 08/16/2014    Priority: High  . Fatty liver 01/05/2020    Priority: Medium  . History of DVT (deep vein thrombosis) 09/25/2018    Priority: Medium  . Post chemotherapy Dry eyes due to decreased tear production 08/06/2017    Priority: Medium  . post chemotherapy Peripheral neuropathy 08/06/2017    Priority: Medium  . BPH associated with nocturia 11/15/2014    Priority: Medium  . Hyperlipidemia 02/04/2007    Priority: Medium  . Essential hypertension 02/04/2007    Priority: Medium  . History of stem cell transplant ([facility]) 03/04/2017    Priority: Low  . Leukopenia 11/15/2014    Priority: Low  . Chest pain 08/13/2014    Priority: Low  . GERD (gastroesophageal reflux disease) 05/03/2014    Priority: Low  . Cervical disc disorder with radiculopathy of cervical region 07/14/2010    Priority: Low  . MIXED HEARING LOSS BILATERAL  07/14/2010    Priority: Low  . ERECTILE DYSFUNCTION 02/07/2007    Priority: Low  . Osteopenia 10/05/2019    Medications- reviewed and updated Current Outpatient Medications  Medication Sig Dispense Refill  . acyclovir (ZOVIRAX) 400 MG tablet Take 400 mg by mouth 2 (two) times daily.    Marland Kitchen albuterol (VENTOLIN HFA) 108 (90 Base) MCG/ACT inhaler Inhale into the lungs in the morning and at bedtime.    Marland Kitchen amLODipine (NORVASC) 10 MG tablet Take 1 tablet (10 mg total) by mouth daily. 90 tablet 3  . aspirin 325 MG EC tablet Take 325 mg by mouth daily.    . Cholecalciferol (VITAMIN D) 50 MCG (2000 UT) CAPS Take 2,000 Units by mouth at bedtime.     . famotidine (PEPCID) 20 MG tablet TAKE 1 TABLET BY MOUTH EVERY DAY AT NIGHT 90 tablet 3  . fluticasone (FLOVENT HFA) 220 MCG/ACT inhaler Inhale 1 puff into the lungs 2 (two) times daily.    . hydrocortisone (CORTEF) 20 MG tablet Take 25 mg by mouth daily.    . irbesartan (AVAPRO) 75 MG tablet Take 0.5 tablets (37.5 mg total) by mouth daily. 45 tablet 3  . Lancets (ONETOUCH DELICA PLUS [id number]) MISC     . loratadine (CLARITIN) 10 MG tablet Take 10 mg by mouth daily.    Marland Kitchen LORazepam (ATIVAN) 0.5 MG tablet Take 0.5 mg by mouth every 4 (four) hours as needed for anxiety.    . Magnesium Oxide 400 MG CAPS Take 1 capsule (400 mg  total) by mouth 5 (five) times daily. (Patient taking differently: Take 400 mg by mouth daily.)    . metoprolol tartrate (LOPRESSOR) 25 MG tablet Take 1 tablet by mouth 2 (two) times daily.    . montelukast (SINGULAIR) 10 MG tablet Take 10 mg by mouth daily.     . Multiple Vitamin (MULTI-VITAMIN) tablet Take by mouth.    Marland Kitchen omeprazole (PRILOSEC) 10 MG capsule TAKE 1 CAPSULE BY MOUTH EVERY DAY 90 capsule 1  . ondansetron (ZOFRAN-ODT) 8 MG disintegrating tablet Take 8 mg by mouth every 8 (eight) hours as needed for nausea or vomiting.    . polyethylene glycol (MIRALAX / GLYCOLAX) 17 g packet Take 1 packet by mouth daily as needed  (constipation).     . polyvinyl alcohol (LIQUIFILM TEARS) 1.4 % ophthalmic solution Place 1 drop into both eyes as needed for dry eyes.    . rosuvastatin (CRESTOR) 40 MG tablet Take 1 tablet (40 mg total) by mouth daily. 90 tablet 3  . senna-docusate (SENOKOT-S) 8.6-50 MG tablet Take 2 tablets by mouth at bedtime.      No current facility-administered medications for this visit.     Objective:  BP 128/78   Pulse 74   Temp 98.3 F (36.8 C) (Temporal)   Resp 18   Ht 5' 10"  (1.778 m)   Wt 229 lb 12.8 oz (104.2 kg)   SpO2 97%   BMI 32.97 kg/m  Gen: NAD, resting comfortably CV: RRR no murmurs rubs or gallops Lungs: CTAB no crackles, wheeze, rhonchi Abdomen: soft/nontender/nondistended/normal bowel sounds.  Ext: trace edema Skin: warm, dry     Assessment and Plan   #pancreatitis in august- has seen GI earlier in august with [facility]- a lot of discomfort and gas since that time. No major concerns from GI. They did suggest lipase level which we did and then has MRI in early January with review with GI planned. Plan for miralax regularly- helping with stools being a little more regular but still may go a few days and that is when pain worsens- relief after BM. No diarrhea or fevers. Continue to monitor.   # reassuring optho exam at [facility] and pulmonology follow up- both due to element GVHD.   #Chronic cough- coricidin was helping some. Went back up on prilosec to 62m and that seemed to help cough (and he is off coricidin). Still mild sinus drainage.   #Ocular migraine- previously once every 3 months- but had 3-4 in a row before most recent [facility] visit- planned January follow up . Keeping diary but thankful has calmed back down.   #[facility]- [facility]  #hyperlipidemia S: Medication: rosuvastatin 40Mg (up from 221m  Lab Results  Component Value Date   CHOL 146 04/14/2019   HDL 53.00 04/14/2019    LDLCALC 55 04/14/2019   LDLDIRECT 77 02/17/2020   TRIG 191.0 (H) 04/14/2019   CHOLHDL 3 04/14/2019   A/P: will update next visit with labs in 3 months  # GERD S:Medication: omeprazole 20Mg, Pepcid 20Mg B12 levels related to PPI use: Lab Results  Component Value Date   VITAMINB12 554 02/17/2020  A/P: improved control with omeprazole back up to 20 mg from 10 mg- continue current medicines- chronic cough better  #hypertension S: medication: Metoprolol 25Mg twice daily, Irbesartan 37.5 Mg, Amlodpine 10Mg BP Readings from Last 3 Encounters:  06/14/20 128/78  03/17/20 124/78  11/24/19 122/68  A/P: Stable. Continue current medications.   # steroid induced hyperglycemia S:  Medication: off all meds- metformin previously- concern relation to pancreatitis Exercise and diet- doing better on his walking up to 4-5 x a week- still feels leg weakness Lab Results  Component Value Date   HGBA1C 6.2 (H) 02/17/2020   HGBA1C 6.7 (H) 11/16/2019   HGBA1C 6.6 (H) 07/22/2019    A/P: hopefully controlled- update a1c next visit  #ED- wants to retrial sildenafil from a few years ago  Recommended follow up: Return in about 3 months (around 09/12/2020) for follow up- or sooner if needed.  Lab/Order associations:   ICD-10-CM   1. Essential hypertension  I10   2. Hyperlipidemia, unspecified hyperlipidemia type  E78.5   3. Gastroesophageal reflux disease, unspecified whether esophagitis present  K21.9   4. Hyperglycemia  R73.9     Return precautions advised.  Garret Reddish, MD

## 2020-06-14 ENCOUNTER — Ambulatory Visit (INDEPENDENT_AMBULATORY_CARE_PROVIDER_SITE_OTHER): Payer: Medicare Other | Admitting: Family Medicine

## 2020-06-14 ENCOUNTER — Other Ambulatory Visit: Payer: Self-pay

## 2020-06-14 ENCOUNTER — Encounter: Payer: Self-pay | Admitting: Family Medicine

## 2020-06-14 VITALS — BP 128/78 | HR 74 | Temp 98.3°F | Resp 18 | Ht 70.0 in | Wt 229.8 lb

## 2020-06-14 DIAGNOSIS — K219 Gastro-esophageal reflux disease without esophagitis: Secondary | ICD-10-CM | POA: Diagnosis not present

## 2020-06-14 DIAGNOSIS — R739 Hyperglycemia, unspecified: Secondary | ICD-10-CM | POA: Diagnosis not present

## 2020-06-14 DIAGNOSIS — I10 Essential (primary) hypertension: Secondary | ICD-10-CM

## 2020-06-14 DIAGNOSIS — E785 Hyperlipidemia, unspecified: Secondary | ICD-10-CM | POA: Diagnosis not present

## 2020-06-14 MED ORDER — SILDENAFIL CITRATE 20 MG PO TABS: ORAL_TABLET | ORAL | 3 refills | Status: DC

## 2020-07-05 DIAGNOSIS — K861 Other chronic pancreatitis: Secondary | ICD-10-CM | POA: Diagnosis not present

## 2020-07-05 DIAGNOSIS — K859 Acute pancreatitis without necrosis or infection, unspecified: Secondary | ICD-10-CM | POA: Diagnosis not present

## 2020-07-05 DIAGNOSIS — K862 Cyst of pancreas: Secondary | ICD-10-CM | POA: Diagnosis not present

## 2020-07-05 DIAGNOSIS — K869 Disease of pancreas, unspecified: Secondary | ICD-10-CM | POA: Diagnosis not present

## 2020-07-06 DIAGNOSIS — K862 Cyst of pancreas: Secondary | ICD-10-CM | POA: Diagnosis not present

## 2020-07-06 DIAGNOSIS — D490 Neoplasm of unspecified behavior of digestive system: Secondary | ICD-10-CM | POA: Diagnosis not present

## 2020-07-06 DIAGNOSIS — K869 Disease of pancreas, unspecified: Secondary | ICD-10-CM | POA: Diagnosis not present

## 2020-07-06 DIAGNOSIS — D379 Neoplasm of uncertain behavior of digestive organ, unspecified: Secondary | ICD-10-CM | POA: Diagnosis not present

## 2020-07-14 DIAGNOSIS — D044 Carcinoma in situ of skin of scalp and neck: Secondary | ICD-10-CM | POA: Diagnosis not present

## 2020-07-18 IMAGING — MR MR ABDOMEN WO/W CM MRCP
16 of 20 series · 38 of 48 positions shown · IV contrast (20ml Multihance)
Comparison: 11/05/2019

CLINICAL DATA: Pancreatic cyst follow-up evaluation.

EXAM:
MRI ABDOMEN WITHOUT AND WITH CONTRAST (INCLUDING MRCP)
TECHNIQUE: Multiplanar multisequence MR imaging of the abdomen was performed
both before and after the administration of intravenous contrast.
Heavily T2-weighted images of the biliary and pancreatic ducts were
obtained, and three-dimensional MRCP images were rendered by post
processing.
CONTRAST:  20mL MULTIHANCE GADOBENATE DIMEGLUMINE 529 MG/ML IV SOLN

[Series 3: T2 · coronal · 5.0mm · 1.56mm/px · 1 of 36 slices shown (1 of 4)]
[im 1/36]
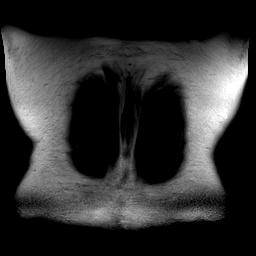

[Series 4: T1 · axial · 3.0mm · 1.19mm/px · z∈[-158,+79]mm · 5 of 160 slices shown]
[im 1/160]
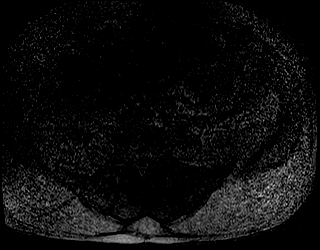
[im 40/160]
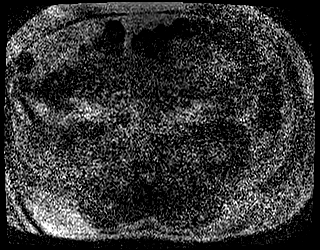
[im 80/160]
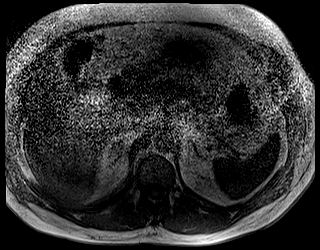
[im 120/160]
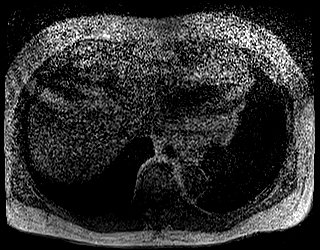
[im 160/160]
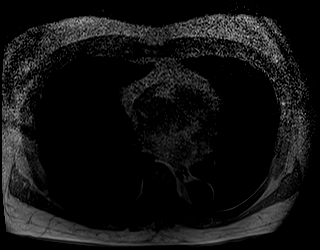

[Series 5: T2 · axial · 5.0mm · 1.48mm/px · 1 of 42 slices shown (2 of 4)]
[im 1/42]
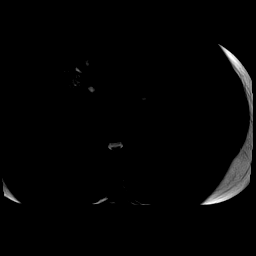

[Series 6: DWI · axial · 5.0mm · 1.49mm/px · z∈[-127,+107]mm · 4 of 120 slices shown (1 of 2)]
[im 1/120]
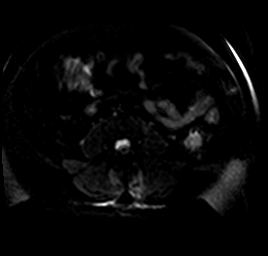
[im 40/120]
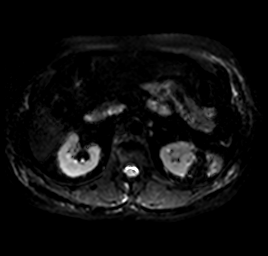
[im 80/120]
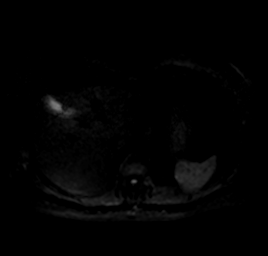
[im 120/120]
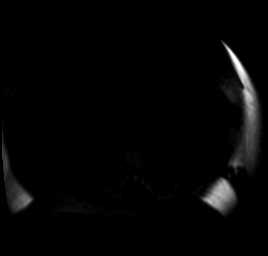

[Series 7: DWI · axial · 5.0mm · 1.49mm/px · 1 of 40 slices shown (2 of 2)]
[im 1/40]
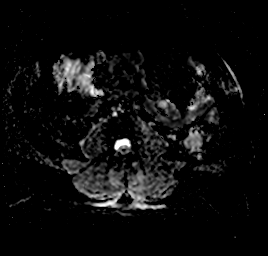

[Series 8: T2 · axial · 6.0mm · 1.25mm/px · 1 of 33 slices shown (3 of 4)]
[im 1/33]
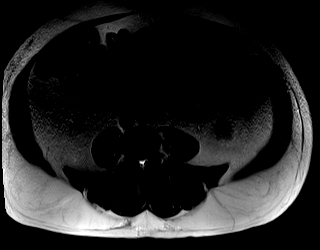

[Series 9: bSSFP · axial · 5.0mm · 1.25mm/px · 1 of 40 slices shown]
[im 1/40]
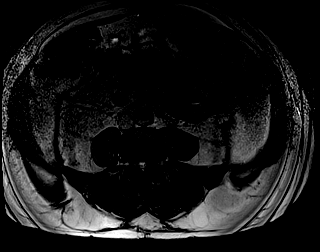

[Series 12: T2 · coronal · 3.0mm · 1.25mm/px · 1 of 26 slices shown (4 of 4)]
[im 1/26]
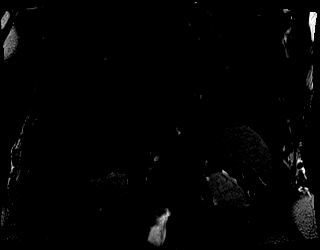

[Series 13: MRCP · coronal · 1.0mm · 0.49mm/px · 3 of 88 slices shown]
[im 1/88]
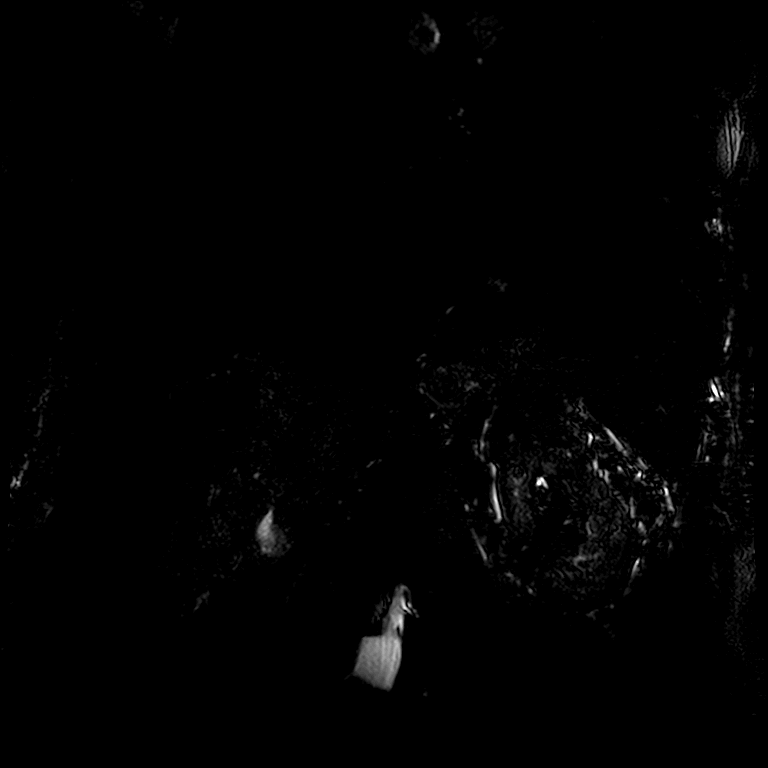
[im 44/88]
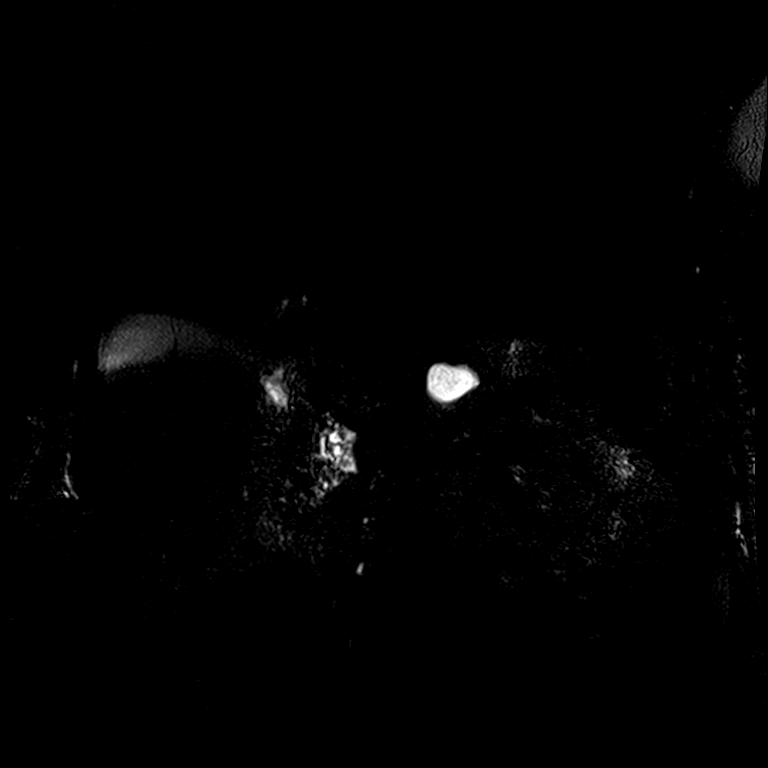
[im 88/88]
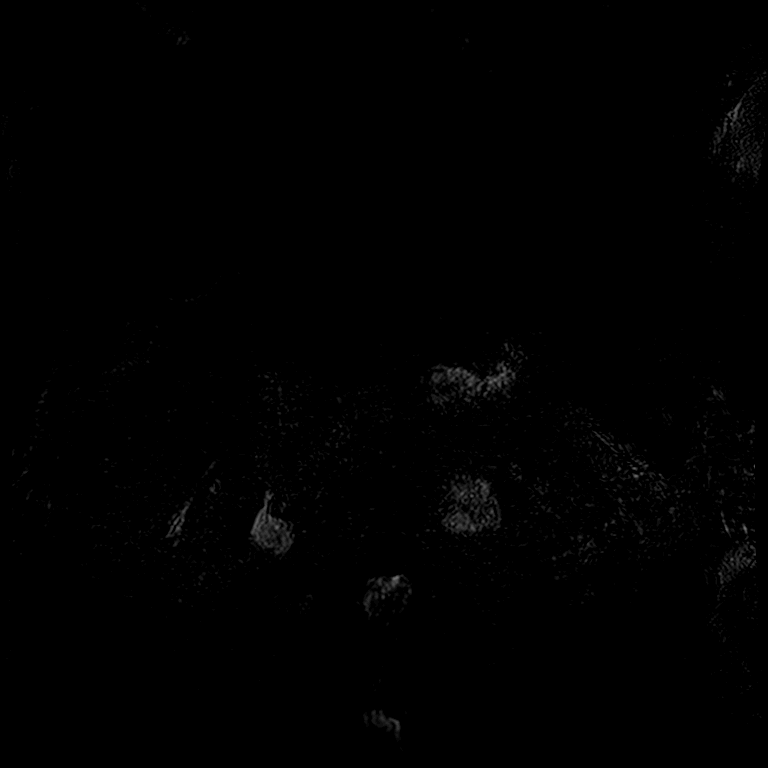

[Series 15: T1 dynamic · axial · non-contrast · 3.0mm · 1.25mm/px · z∈[-158,+79]mm · 3 of 80 slices shown]
[im 1/80]
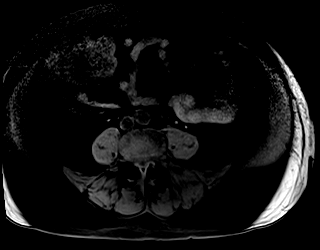
[im 40/80]
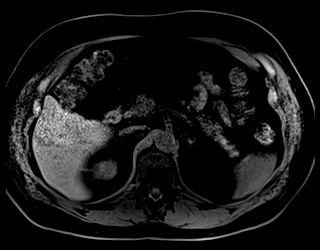
[im 80/80]
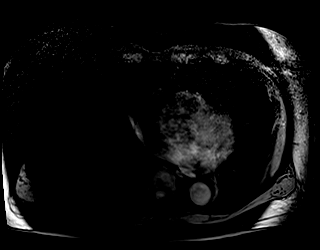

[Series 16: T1 dynamic post-contrast · axial · 3.0mm · 1.25mm/px · z∈[-158,+79]mm · 3 of 80 slices shown (1 of 6)]
[im 1/80]
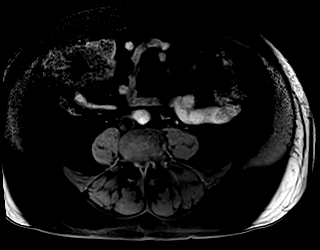
[im 40/80]
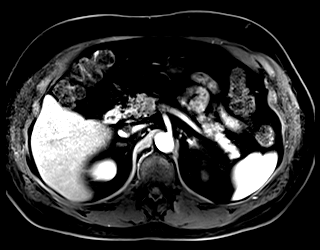
[im 80/80]
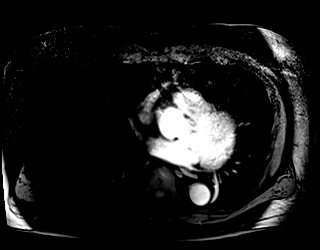

[Series 17: T1 dynamic post-contrast · axial · 3.0mm · 1.25mm/px · z∈[-158,+79]mm · 3 of 80 slices shown (2 of 6)]
[im 1/80]
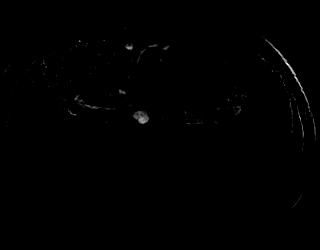
[im 40/80]
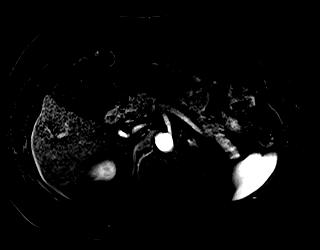
[im 80/80]
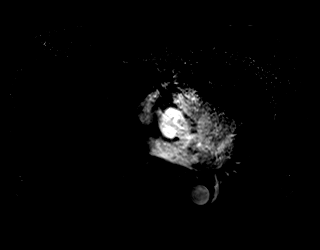

[Series 18: T1 dynamic post-contrast · axial · 3.0mm · 1.25mm/px · z∈[-158,+79]mm · 3 of 80 slices shown (3 of 6)]
[im 1/80]
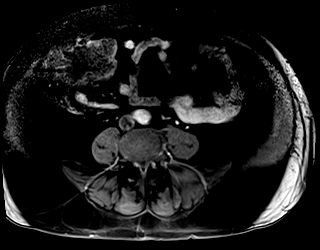
[im 40/80]
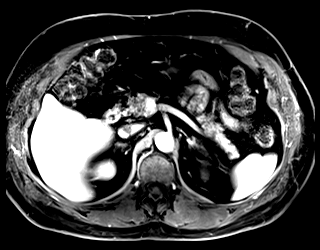
[im 80/80]
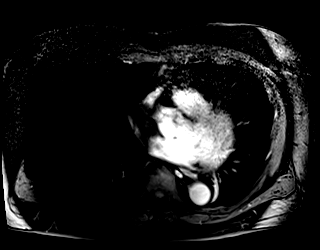

[Series 19: T1 dynamic post-contrast · axial · 3.0mm · 1.25mm/px · z∈[-158,+79]mm · 3 of 80 slices shown (4 of 6)]
[im 1/80]
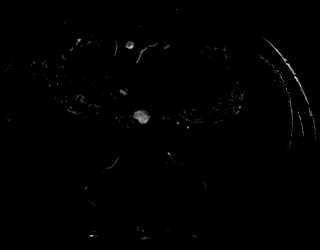
[im 40/80]
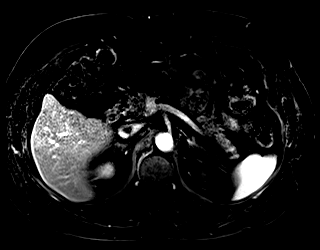
[im 80/80]
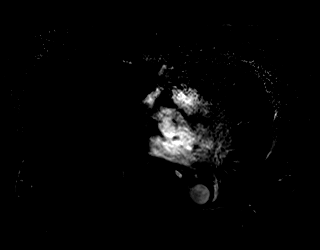

[Series 20: T1 dynamic post-contrast · axial · 3.0mm · 1.25mm/px · z∈[-158,+79]mm · 3 of 80 slices shown (5 of 6)]
[im 1/80]
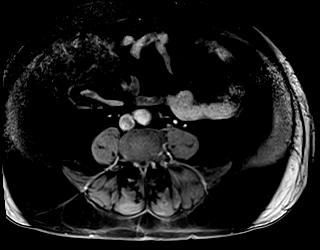
[im 40/80]
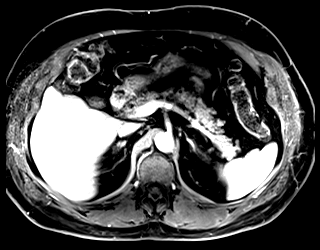
[im 80/80]
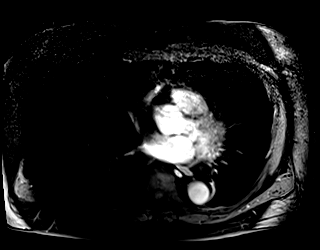

[Series 21: T1 dynamic post-contrast · axial · 3.0mm · 1.25mm/px · z∈[-158,-41]mm · 2 of 80 slices shown (6 of 6)]
[im 1/80]
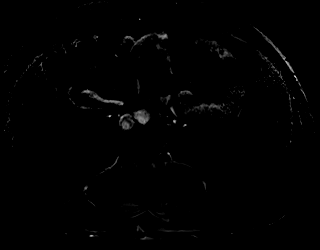
[im 40/80]
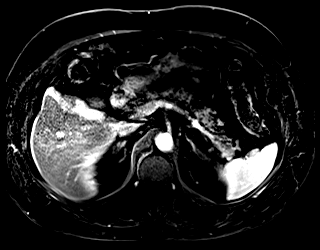

[38 of 48 positions shown; findings below may reference images not displayed]

FINDINGS: Lower chest: Incidental imaging of the lung bases is unremarkable.
No consolidation. No pleural effusion. Limited assessment on MRI.

Hepatobiliary: Probable background hepatic steatosis based on T2
liver signal. In and out of phase assessment markedly limited
secondary to signal loss.

Sludge in the gallbladder.

No pericholecystic inflammation.

Normal biliary anatomy without filling defect in the common bile
duct or sign of biliary ductal dilation.

No focal, suspicious hepatic lesion.  The portal vein is patent.

Pancreas: In the pancreatic head there is a 2.2 x 1.1 cm area of
multi cystic change with multiple small cystic lesions clustered in
the pancreatic head. Prior imaging suggests this is likely present,
certainly on the more recent prior exam and perhaps as far back as
5293. Reconstructed biliary images show potential ductal
communication.

No main duct dilation.

Peripancreatic cystic area measuring 2.9 x 2.8 cm. The approximately
2.3 x 2.7 cm in 5293. No definite ductal communication.

Suggestion of wall enhancement without mural nodularity along the
inferior margin (a image 35, series 20)

Spleen:  Normal in size and contour without focal lesion.

Adrenals/Urinary Tract:  Adrenal glands are normal.

Bilateral cysts.

Stomach/Bowel: Limited assessment of gastrointestinal tract without
acute process.

Vascular/Lymphatic: Vascular structures in the abdomen are patent
without aneurysmal dilation or atherosclerotic changes. No
adenopathy in the retroperitoneum or upper abdomen.

Other:  No ascites.

Musculoskeletal: No suspicious bone lesions identified.
IMPRESSION: 1. Finding in the lesser sac with multi cystic changes appears to
represent a bilobed cystic area that may have enlarged slightly
since 5293 and may show some peripheral enhancement along the
inferior wall. Finding could represent sequela of prior pancreatitis
if there is this history. There is no definite ductal communication
on today's scan. Lymphangioma is also considered given this
location. It does not appear to communicate with the stomach.
2. Multi cystic area in the pancreatic head not well seen on
previous imaging may show ductal communication perhaps side branch
intraductal papillary mucinous neoplasm with multiple dilated side
branch elements but without main duct dilation versus is serous
cystadenoma as ductal communication is difficult to determine given
the proximity of the duct and the small cystic areas within this
location.
3. For above findings would suggest a 6 month follow-up with MRI.
This recommendation follows ACR consensus guidelines: Management of
Incidental Pancreatic Cysts: A White Paper of the ACR Incidental
Findings Committee. [HOSPITAL] 0431;[DATE].

## 2020-07-20 DIAGNOSIS — E2740 Unspecified adrenocortical insufficiency: Secondary | ICD-10-CM | POA: Diagnosis not present

## 2020-07-20 DIAGNOSIS — E119 Type 2 diabetes mellitus without complications: Secondary | ICD-10-CM | POA: Diagnosis not present

## 2020-07-20 DIAGNOSIS — D490 Neoplasm of unspecified behavior of digestive system: Secondary | ICD-10-CM | POA: Diagnosis not present

## 2020-07-20 DIAGNOSIS — T380X5D Adverse effect of glucocorticoids and synthetic analogues, subsequent encounter: Secondary | ICD-10-CM | POA: Diagnosis not present

## 2020-07-20 DIAGNOSIS — Z79899 Other long term (current) drug therapy: Secondary | ICD-10-CM | POA: Diagnosis not present

## 2020-07-20 DIAGNOSIS — Z794 Long term (current) use of insulin: Secondary | ICD-10-CM | POA: Diagnosis not present

## 2020-07-20 DIAGNOSIS — E1165 Type 2 diabetes mellitus with hyperglycemia: Secondary | ICD-10-CM | POA: Diagnosis not present

## 2020-07-20 DIAGNOSIS — E118 Type 2 diabetes mellitus with unspecified complications: Secondary | ICD-10-CM | POA: Diagnosis not present

## 2020-07-20 DIAGNOSIS — E785 Hyperlipidemia, unspecified: Secondary | ICD-10-CM | POA: Diagnosis not present

## 2020-07-20 DIAGNOSIS — Z9484 Stem cells transplant status: Secondary | ICD-10-CM | POA: Diagnosis not present

## 2020-07-20 DIAGNOSIS — I10 Essential (primary) hypertension: Secondary | ICD-10-CM | POA: Diagnosis not present

## 2020-07-20 DIAGNOSIS — Z7982 Long term (current) use of aspirin: Secondary | ICD-10-CM | POA: Diagnosis not present

## 2020-07-20 DIAGNOSIS — E1140 Type 2 diabetes mellitus with diabetic neuropathy, unspecified: Secondary | ICD-10-CM | POA: Diagnosis not present

## 2020-07-20 DIAGNOSIS — D469 Myelodysplastic syndrome, unspecified: Secondary | ICD-10-CM | POA: Diagnosis not present

## 2020-07-20 DIAGNOSIS — T380X5A Adverse effect of glucocorticoids and synthetic analogues, initial encounter: Secondary | ICD-10-CM | POA: Diagnosis not present

## 2020-07-20 DIAGNOSIS — R739 Hyperglycemia, unspecified: Secondary | ICD-10-CM | POA: Diagnosis not present

## 2020-07-20 DIAGNOSIS — Z8673 Personal history of transient ischemic attack (TIA), and cerebral infarction without residual deficits: Secondary | ICD-10-CM | POA: Diagnosis not present

## 2020-08-04 DIAGNOSIS — Z23 Encounter for immunization: Secondary | ICD-10-CM | POA: Diagnosis not present

## 2020-08-22 ENCOUNTER — Other Ambulatory Visit (INDEPENDENT_AMBULATORY_CARE_PROVIDER_SITE_OTHER): Payer: Medicare Other

## 2020-08-22 ENCOUNTER — Other Ambulatory Visit: Payer: Self-pay

## 2020-08-22 DIAGNOSIS — E785 Hyperlipidemia, unspecified: Secondary | ICD-10-CM | POA: Diagnosis not present

## 2020-08-22 DIAGNOSIS — R739 Hyperglycemia, unspecified: Secondary | ICD-10-CM

## 2020-08-22 DIAGNOSIS — I10 Essential (primary) hypertension: Secondary | ICD-10-CM

## 2020-08-22 NOTE — Addendum Note (Signed)
Addended by: Brandy Hale on: 08/22/2020 08:03 AM   Modules accepted: Orders

## 2020-08-23 LAB — COMPLETE METABOLIC PANEL WITH GFR
AG Ratio: 1.2 (calc) (ref 1.0–2.5)
ALT: 16 U/L (ref 9–46)
AST: 19 U/L (ref 10–35)
Albumin: 3.7 g/dL (ref 3.6–5.1)
Alkaline phosphatase (APISO): 94 U/L (ref 35–144)
BUN: 11 mg/dL (ref 7–25)
CO2: 25 mmol/L (ref 20–32)
Calcium: 8.9 mg/dL (ref 8.6–10.3)
Chloride: 106 mmol/L (ref 98–110)
Creat: 1.03 mg/dL (ref 0.70–1.18)
GFR, Est African American: 85 mL/min/{1.73_m2} (ref >=60)
GFR, Est Non African American: 73 mL/min/{1.73_m2} (ref >=60)
Globulin: 3.1 g/dL (calc) (ref 1.9–3.7)
Glucose, Bld: 96 mg/dL (ref 65–99)
Potassium: 4.3 mmol/L (ref 3.5–5.3)
Sodium: 138 mmol/L (ref 135–146)
Total Bilirubin: 0.5 mg/dL (ref 0.2–1.2)
Total Protein: 6.8 g/dL (ref 6.1–8.1)

## 2020-08-23 LAB — CBC WITH DIFFERENTIAL/PLATELET
Absolute Monocytes: 1555 cells/uL — ABNORMAL HIGH (ref 200–950)
Basophils Absolute: 65 cells/uL (ref 0–200)
Basophils Relative: 0.6 %
Eosinophils Absolute: 356 cells/uL (ref 15–500)
Eosinophils Relative: 3.3 %
HCT: 40.1 % (ref 38.5–50.0)
Hemoglobin: 12.9 g/dL — ABNORMAL LOW (ref 13.2–17.1)
Lymphs Abs: 5746 cells/uL — ABNORMAL HIGH (ref 850–3900)
MCH: 28.1 pg (ref 27.0–33.0)
MCHC: 32.2 g/dL (ref 32.0–36.0)
MCV: 87.4 fL (ref 80.0–100.0)
MPV: 8.6 fL (ref 7.5–12.5)
Monocytes Relative: 14.4 %
Neutro Abs: 3078 cells/uL (ref 1500–7800)
Neutrophils Relative %: 28.5 %
Platelets: 399 10*3/uL (ref 140–400)
RBC: 4.59 10*6/uL (ref 4.20–5.80)
RDW: 15.7 % — ABNORMAL HIGH (ref 11.0–15.0)
Total Lymphocyte: 53.2 %
WBC: 10.8 10*3/uL (ref 3.8–10.8)

## 2020-08-23 LAB — LIPID PANEL W/REFLEX DIRECT LDL
Cholesterol: 128 mg/dL (ref <200)
HDL: 44 mg/dL (ref >=40)
LDL Cholesterol (Calc): 59 mg/dL (calc)
Non-HDL Cholesterol (Calc): 84 mg/dL (calc) (ref <130)
Total CHOL/HDL Ratio: 2.9 (calc) (ref <5.0)
Triglycerides: 171 mg/dL — ABNORMAL HIGH (ref <150)

## 2020-08-23 LAB — HEMOGLOBIN A1C
Hgb A1c MFr Bld: 6.7 % of total Hgb — ABNORMAL HIGH (ref <5.7)
Mean Plasma Glucose: 146 mg/dL
eAG (mmol/L): 8.1 mmol/L

## 2020-08-24 NOTE — Patient Instructions (Addendum)
MMR today per [facility] recommendations. [facility] did not have this. Team list under history of stem cell transplant.   Health Maintenance Due  Topic Date Due  . TETANUS/TDAP - consider at pharmacy but wait on this - perhaps [facility] can do this vs pharmacy 12/06/2015   Team please enter covid shot #4 due to immunosuppression [employer] on 07/26/20  Recommended follow up: Return in about 4 months (around 12/26/2020).

## 2020-08-24 NOTE — Progress Notes (Unsigned)
Phone [phone] In person visit   Subjective:   Brandon [name] is a 70 y.o. year old very pleasant male patient who presents for/with See problem oriented charting Chief Complaint  Patient presents with  . Hyperlipidemia  . Hypertension  . Gastroesophageal Reflux   This visit occurred during the SARS-CoV-2 public health emergency.  Safety protocols were in place, including screening questions prior to the visit, additional usage of staff PPE, and extensive cleaning of exam room while observing appropriate contact time as indicated for disinfecting solutions.   Past Medical History-  Patient Active Problem List   Diagnosis Date Noted  . Allergic rhinitis 10/03/2019    Priority: High  . Pancreatic lesion 10/03/2019    Priority: High  . History of CVA (cerebrovascular accident) 03/10/2019    Priority: High  . Adrenocortical insufficiency ([place]) 08/06/2017    Priority: High  . GVHD (graft versus host disease) ([place]) 03/04/2017    Priority: High  . History of myelodysplastic syndrome 12/06/2015    Priority: High  . Steroid-induced hyperglycemia 08/16/2014    Priority: High  . Fatty liver 01/05/2020    Priority: Medium  . History of DVT (deep vein thrombosis) 09/25/2018    Priority: Medium  . Post chemotherapy Dry eyes due to decreased tear production 08/06/2017    Priority: Medium  . post chemotherapy Peripheral neuropathy 08/06/2017    Priority: Medium  . BPH associated with nocturia 11/15/2014    Priority: Medium  . Hyperlipidemia 02/04/2007    Priority: Medium  . Essential hypertension 02/04/2007    Priority: Medium  . History of stem cell transplant ([place]) 03/04/2017    Priority: Low  . Leukopenia 11/15/2014    Priority: Low  . Chest pain 08/13/2014    Priority: Low  . GERD (gastroesophageal reflux disease) 05/03/2014    Priority: Low  . Cervical disc disorder with radiculopathy of cervical region 07/14/2010    Priority: Low  . MIXED HEARING LOSS BILATERAL  07/14/2010    Priority: Low  . ERECTILE DYSFUNCTION 02/07/2007    Priority: Low  . Osteopenia 10/05/2019    Medications- reviewed and updated Current Outpatient Medications  Medication Sig Dispense Refill  . acyclovir (ZOVIRAX) 400 MG tablet Take 400 mg by mouth 2 (two) times daily.    Marland Kitchen albuterol (VENTOLIN HFA) 108 (90 Base) MCG/ACT inhaler Inhale into the lungs in the morning and at bedtime.    Marland Kitchen amLODipine (NORVASC) 10 MG tablet Take 1 tablet (10 mg total) by mouth daily. 90 tablet 3  . aspirin 325 MG EC tablet Take 325 mg by mouth daily.    . Cholecalciferol (VITAMIN D) 50 MCG (2000 UT) CAPS Take 2,000 Units by mouth at bedtime.     . famotidine (PEPCID) 20 MG tablet TAKE 1 TABLET BY MOUTH EVERY DAY AT NIGHT 90 tablet 3  . fluticasone (FLOVENT HFA) 220 MCG/ACT inhaler Inhale 1 puff into the lungs 2 (two) times daily.    . hydrocortisone (CORTEF) 20 MG tablet Take 25 mg by mouth daily.    . Lancets (ONETOUCH DELICA PLUS [id number]) MISC     . loratadine (CLARITIN) 10 MG tablet Take 10 mg by mouth daily.    Marland Kitchen LORazepam (ATIVAN) 0.5 MG tablet Take 0.5 mg by mouth every 4 (four) hours as needed for anxiety.    . Magnesium Oxide 400 MG CAPS Take 1 capsule (400 mg total) by mouth 5 (five) times daily. (Patient taking differently: Take 400 mg by mouth daily.)    .  metFORMIN (GLUCOPHAGE) 500 MG tablet Take 500 mg by mouth 2 (two) times daily.    . metoprolol tartrate (LOPRESSOR) 25 MG tablet Take 1 tablet by mouth 2 (two) times daily.    . montelukast (SINGULAIR) 10 MG tablet Take 10 mg by mouth daily.     . Multiple Vitamin (MULTI-VITAMIN) tablet Take by mouth.    Marland Kitchen omeprazole (PRILOSEC) 10 MG capsule TAKE 1 CAPSULE BY MOUTH EVERY DAY 90 capsule 1  . ondansetron (ZOFRAN-ODT) 8 MG disintegrating tablet Take 8 mg by mouth every 8 (eight) hours as needed for nausea or vomiting.    . polyethylene glycol (MIRALAX / GLYCOLAX) 17 g packet Take 1 packet by mouth daily as needed (constipation).     .  polyvinyl alcohol (LIQUIFILM TEARS) 1.4 % ophthalmic solution Place 1 drop into both eyes as needed for dry eyes.    . rosuvastatin (CRESTOR) 40 MG tablet Take 1 tablet (40 mg total) by mouth daily. 90 tablet 3  . senna-docusate (SENOKOT-S) 8.6-50 MG tablet Take 2 tablets by mouth at bedtime.     . irbesartan (AVAPRO) 75 MG tablet Take 0.5 tablets (37.5 mg total) by mouth daily. (Patient not taking: Reported on 08/26/2020) 45 tablet 3  . sildenafil (REVATIO) 20 MG tablet Take 2-5 tablets as needed once every 48 hours for erectile dysfunction (Patient not taking: Reported on 08/26/2020) 50 tablet 3   No current facility-administered medications for this visit.     Objective:  BP 114/62   Pulse 67   Temp 98.5 F (36.9 C) (Temporal)   Ht 5' 10"  (1.778 m)   Wt 233 lb 3.2 oz (105.8 kg)   SpO2 96%   BMI 33.46 kg/m  Gen: NAD, resting comfortably CV: RRR no murmurs rubs or gallops Lungs: CTAB no crackles, wheeze, rhonchi Abdomen: soft/nontender/nondistended/normal bowel sounds.  Ext: no edema Skin: warm, dry    Assessment and Plan   #upcoming visits-  April 6th visit coming up with Dr. Corena Pilgrim  # neuropathy- post chemotherapy. Gets some discomfort walking. Doesn't feel pricks on cbg check as much.    # steroid induced hyperglycemia S: Medication:metformin 543m once daily right now  Lab Results  Component Value Date   HGBA1C 6.7 (H) 08/22/2020   HGBA1C 6.2 (H) 02/17/2020   HGBA1C 6.7 (H) 11/16/2019   A/P: reasonably controlled- continue current medicine  #hyperlipidemia/history of CVA S: Medication:Rosuvastatin 40Mg Lab Results  Component Value Date   CHOL 128 08/22/2020   HDL 44 08/22/2020   LDLCALC 59 08/22/2020   LDLDIRECT 77 02/17/2020   TRIG 171 (H) 08/22/2020   CHOLHDL 2.9 08/22/2020   A/P: well controlled- continue current meds. Has some weakness in legs at times- reducing dose may be helpful but would likely need some lifestyle changes first and LDL lower before  we can make this transition.   #hypertension S: medication: metoprolol 25Mg, Irbesartan 37.5Mg, Amlodipine 10Mg BP Readings from Last 3 Encounters:  08/26/20 114/62  06/14/20 128/78  03/17/20 124/78  A/P: Stable. Continue current medications.   #HM-  MMR- could not get at pharmacy- needs with history of stem cell transplant (knows possibly not covered). We will give today. Needs Tdap to but hold off for now  Recommended follow up: Return in about 4 months (around 12/26/2020). Future Appointments  Date Time Provider DMontandon 09/12/2020 10:15 AM [facility] HEALTH COACH [facility]   Lab/Order associations:   ICD-10-CM   1. Essential hypertension  I10   2. Hyperlipidemia, unspecified hyperlipidemia  type  E78.5   3. History of stem cell transplant ([place])  Z94.84   4. Steroid-induced hyperglycemia  R73.9    T38.0X5A   5. History of CVA (cerebrovascular accident)  Z86.73   6. Peripheral polyneuropathy  G62.9    Return precautions advised.  Garret Reddish, MD

## 2020-08-26 ENCOUNTER — Encounter: Payer: Self-pay | Admitting: Family Medicine

## 2020-08-26 ENCOUNTER — Other Ambulatory Visit: Payer: Self-pay

## 2020-08-26 ENCOUNTER — Ambulatory Visit (INDEPENDENT_AMBULATORY_CARE_PROVIDER_SITE_OTHER): Payer: Medicare Other | Admitting: Family Medicine

## 2020-08-26 VITALS — BP 114/62 | HR 67 | Temp 98.5°F | Ht 70.0 in | Wt 233.2 lb

## 2020-08-26 DIAGNOSIS — E785 Hyperlipidemia, unspecified: Secondary | ICD-10-CM | POA: Diagnosis not present

## 2020-08-26 DIAGNOSIS — I10 Essential (primary) hypertension: Secondary | ICD-10-CM

## 2020-08-26 DIAGNOSIS — T380X5A Adverse effect of glucocorticoids and synthetic analogues, initial encounter: Secondary | ICD-10-CM | POA: Diagnosis not present

## 2020-08-26 DIAGNOSIS — Z9484 Stem cells transplant status: Secondary | ICD-10-CM | POA: Diagnosis not present

## 2020-08-26 DIAGNOSIS — G629 Polyneuropathy, unspecified: Secondary | ICD-10-CM

## 2020-08-26 DIAGNOSIS — Z8673 Personal history of transient ischemic attack (TIA), and cerebral infarction without residual deficits: Secondary | ICD-10-CM

## 2020-08-26 DIAGNOSIS — K219 Gastro-esophageal reflux disease without esophagitis: Secondary | ICD-10-CM

## 2020-08-26 DIAGNOSIS — R739 Hyperglycemia, unspecified: Secondary | ICD-10-CM | POA: Diagnosis not present

## 2020-08-26 DIAGNOSIS — Z23 Encounter for immunization: Secondary | ICD-10-CM

## 2020-08-26 NOTE — Addendum Note (Signed)
Addended by: Linton Ham on: 08/26/2020 12:00 PM   Modules accepted: Orders

## 2020-08-31 ENCOUNTER — Encounter: Payer: Self-pay | Admitting: Family Medicine

## 2020-09-07 DIAGNOSIS — L089 Local infection of the skin and subcutaneous tissue, unspecified: Secondary | ICD-10-CM | POA: Diagnosis not present

## 2020-09-07 DIAGNOSIS — I517 Cardiomegaly: Secondary | ICD-10-CM | POA: Diagnosis not present

## 2020-09-07 DIAGNOSIS — H04123 Dry eye syndrome of bilateral lacrimal glands: Secondary | ICD-10-CM | POA: Diagnosis not present

## 2020-09-07 DIAGNOSIS — L821 Other seborrheic keratosis: Secondary | ICD-10-CM | POA: Diagnosis not present

## 2020-09-07 DIAGNOSIS — D2260 Melanocytic nevi of unspecified upper limb, including shoulder: Secondary | ICD-10-CM | POA: Diagnosis not present

## 2020-09-07 DIAGNOSIS — D89813 Graft-versus-host disease, unspecified: Secondary | ICD-10-CM | POA: Diagnosis not present

## 2020-09-07 DIAGNOSIS — D044 Carcinoma in situ of skin of scalp and neck: Secondary | ICD-10-CM | POA: Diagnosis not present

## 2020-09-07 DIAGNOSIS — D2270 Melanocytic nevi of unspecified lower limb, including hip: Secondary | ICD-10-CM | POA: Diagnosis not present

## 2020-09-07 DIAGNOSIS — D485 Neoplasm of uncertain behavior of skin: Secondary | ICD-10-CM | POA: Diagnosis not present

## 2020-09-07 DIAGNOSIS — D225 Melanocytic nevi of trunk: Secondary | ICD-10-CM | POA: Diagnosis not present

## 2020-09-07 DIAGNOSIS — Z961 Presence of intraocular lens: Secondary | ICD-10-CM | POA: Diagnosis not present

## 2020-09-07 DIAGNOSIS — H018 Other specified inflammations of eyelid: Secondary | ICD-10-CM | POA: Diagnosis not present

## 2020-09-07 DIAGNOSIS — T8609 Other complications of bone marrow transplant: Secondary | ICD-10-CM | POA: Diagnosis not present

## 2020-09-07 DIAGNOSIS — D1801 Hemangioma of skin and subcutaneous tissue: Secondary | ICD-10-CM | POA: Diagnosis not present

## 2020-09-07 DIAGNOSIS — Z85828 Personal history of other malignant neoplasm of skin: Secondary | ICD-10-CM | POA: Diagnosis not present

## 2020-09-07 DIAGNOSIS — Z9481 Bone marrow transplant status: Secondary | ICD-10-CM | POA: Diagnosis not present

## 2020-09-07 DIAGNOSIS — L814 Other melanin hyperpigmentation: Secondary | ICD-10-CM | POA: Diagnosis not present

## 2020-09-07 DIAGNOSIS — I10 Essential (primary) hypertension: Secondary | ICD-10-CM | POA: Diagnosis not present

## 2020-09-07 DIAGNOSIS — L570 Actinic keratosis: Secondary | ICD-10-CM | POA: Diagnosis not present

## 2020-09-08 ENCOUNTER — Ambulatory Visit (INDEPENDENT_AMBULATORY_CARE_PROVIDER_SITE_OTHER): Payer: Medicare Other

## 2020-09-08 ENCOUNTER — Other Ambulatory Visit: Payer: Medicare Other

## 2020-09-08 DIAGNOSIS — Z0000 Encounter for general adult medical examination without abnormal findings: Secondary | ICD-10-CM | POA: Diagnosis not present

## 2020-09-08 NOTE — Progress Notes (Signed)
Virtual Visit via Telephone Note  I connected with  Brandon [name] on 09/08/20 at  8:00 AM EDT by telephone and verified that I am speaking with the correct person using two identifiers.  Medicare Annual Wellness visit completed telephonically due to Covid-19 pandemic.   Persons participating in this call: This Health Coach and this patient.   Location: Patient: Home Provider: Office   I discussed the limitations, risks, security and privacy concerns of performing an evaluation and management service by telephone and the availability of in person appointments. The patient expressed understanding and agreed to proceed.  Unable to perform video visit due to video visit attempted and failed and/or patient does not have video capability.   Some vital signs may be absent or patient reported.   Willette Brace, LPN    Subjective:   Brandon [name] is a 70 y.o. male who presents for Medicare Annual/Subsequent preventive examination.  Review of Systems     Cardiac Risk Factors include: advanced age (>13mn, >>62women);male gender;dyslipidemia;hypertension;obesity (BMI >30kg/m2)     Objective:    There were no vitals filed for this visit. There is no height or weight on file to calculate BMI.  Advanced Directives 09/08/2020 09/09/2019 09/08/2019 09/04/2019 09/03/2019 05/06/2019 11/07/2018  Does Patient Have a Medical Advance Directive? Yes Yes Yes Yes Yes Yes No  Type of Advance Directive Living will;Healthcare Power of [place] HTopazOut of facility DNR (pink MOST or yellow form);Living will Living will;Healthcare Power of Attorney -  Does patient want to make changes to medical advance directive? - No - Patient declined No - Patient declined No - Patient declined - No - Patient declined -  Copy of [place] Chart? No - copy  requested - No - copy requested No - copy requested No - copy requested No - copy requested -  Would patient like information on creating a medical advance directive? - - - - - - -    Current Medications (verified) Outpatient Encounter Medications as of 09/08/2020  Medication Sig  . acyclovir (ZOVIRAX) 400 MG tablet Take 400 mg by mouth 2 (two) times daily.  .Marland Kitchenalbuterol (VENTOLIN HFA) 108 (90 Base) MCG/ACT inhaler Inhale into the lungs in the morning and at bedtime.  .Marland KitchenamLODipine (NORVASC) 10 MG tablet Take 1 tablet (10 mg total) by mouth daily.  .Marland Kitchenaspirin 325 MG EC tablet Take 325 mg by mouth daily.  . Cholecalciferol (VITAMIN D) 50 MCG (2000 UT) CAPS Take 2,000 Units by mouth at bedtime.   . famotidine (PEPCID) 20 MG tablet TAKE 1 TABLET BY MOUTH EVERY DAY AT NIGHT  . fluticasone (FLOVENT HFA) 220 MCG/ACT inhaler Inhale 1 puff into the lungs 2 (two) times daily.  . hydrocortisone (CORTEF) 20 MG tablet Take 25 mg by mouth daily.  .Marland Kitchenloratadine (CLARITIN) 10 MG tablet Take 10 mg by mouth daily.  . Magnesium Oxide 400 MG CAPS Take 1 capsule (400 mg total) by mouth 5 (five) times daily. (Patient taking differently: Take 400 mg by mouth daily.)  . metFORMIN (GLUCOPHAGE) 500 MG tablet Take 500 mg by mouth 2 (two) times daily.  . metoprolol tartrate (LOPRESSOR) 25 MG tablet Take 1 tablet by mouth 2 (two) times daily.  . montelukast (SINGULAIR) 10 MG tablet Take 10 mg by mouth daily.   . Multiple Vitamin (MULTI-VITAMIN) tablet Take by mouth.  . niacinamide  500 MG tablet Take 500 mg by mouth 2 (two) times daily with a meal.  . omeprazole (PRILOSEC) 10 MG capsule TAKE 1 CAPSULE BY MOUTH EVERY DAY (Patient taking differently: 20 mg.)  . ondansetron (ZOFRAN-ODT) 8 MG disintegrating tablet Take 8 mg by mouth every 8 (eight) hours as needed for nausea or vomiting.  . polyethylene glycol (MIRALAX / GLYCOLAX) 17 g packet Take 1 packet by mouth daily as needed (constipation).   . polyvinyl alcohol (LIQUIFILM  TEARS) 1.4 % ophthalmic solution Place 1 drop into both eyes as needed for dry eyes.  . rosuvastatin (CRESTOR) 40 MG tablet Take 1 tablet (40 mg total) by mouth daily.  Marland Kitchen senna-docusate (SENOKOT-S) 8.6-50 MG tablet Take 2 tablets by mouth at bedtime.   . irbesartan (AVAPRO) 75 MG tablet Take 0.5 tablets (37.5 mg total) by mouth daily. (Patient not taking: No sig reported)  . Lancets (ONETOUCH DELICA PLUS [id number]) MISC   . sildenafil (REVATIO) 20 MG tablet Take 2-5 tablets as needed once every 48 hours for erectile dysfunction (Patient not taking: No sig reported)  . [DISCONTINUED] LORazepam (ATIVAN) 0.5 MG tablet Take 0.5 mg by mouth every 4 (four) hours as needed for anxiety. (Patient not taking: Reported on 09/08/2020)   No facility-administered encounter medications on file as of 09/08/2020.    Allergies (verified) Levofloxacin and Tape   History: Past Medical History:  Diagnosis Date  . Adrenocortical insufficiency ([place]) 08/06/2017   Patient with 2 hospitalizations in January 2019- treated with fluids, antibiotics, steroids- ultimately thought most likely due to adrenal insufficiency with no obvious source of infection found  . Allergy    seasonal/animals  . Chronic prostatitis 12/17/2008  . Diverticulosis 01/16/2011   History of diverticulitis   . ERECTILE DYSFUNCTION 02/07/2007  . HYPERLIPIDEMIA 02/04/2007  . HYPERTENSION 02/04/2007  . NEOPLASM, MALIGNANT, SKIN, BACK 02/09/2009  . Pneumonia, viral 12/2017  . S/P bone marrow transplant [facility])    Past Surgical History:  Procedure Laterality Date  . CATARACT EXTRACTION Left 06/2018  . CATARACT EXTRACTION Right 10/08/2019  . none    . SCALP LESION REMOVAL W/ FLAP AND SKIN GRAFT Left    Family History  Problem Relation Age of Onset  . Breast cancer Mother   . CVA Mother        quit taking HTN meds  . Alzheimer's disease Father        late 9s. mid 36s  . Asperger's syndrome Son    Social History   Socioeconomic History  .  Marital status: Married    Spouse name: Not on file  . Number of children: Not on file  . Years of education: Not on file  . Highest education level: Not on file  Occupational History  . Not on file  Tobacco Use  . Smoking status: Never Smoker  . Smokeless tobacco: Never Used  Substance and Sexual Activity  . Alcohol use: Yes    Alcohol/week: 3.0 standard drinks    Types: 3 Standard drinks or equivalent per week  . Drug use: No  . Sexual activity: Yes  Other Topics Concern  . Not on file  Social History Narrative   Married 1973. Wife has Parkinsons. 2 daughters, 1 son (middle). 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 Difficulty of  Paying Living Expenses: Not hard at all  Food Insecurity: No Food Insecurity  . Worried About Charity fundraiser in the Last Year: Never true  . Ran Out of Food in the Last Year: Never true  Transportation Needs: No Transportation Needs  . Lack of Transportation (Medical): No  . Lack of Transportation (Non-Medical): No  Physical Activity: Insufficiently Active  . Days of Exercise per Week: 3 days  . Minutes of Exercise per Session: 30 min  Stress: No Stress Concern Present  . Feeling of Stress : Not at all  Social Connections: Moderately Isolated  . Frequency of Communication with Friends and Family: More than three times a week  . Frequency of Social Gatherings with Friends and Family: Twice a week  . Attends Religious Services: Never  . Active Member of Clubs or Organizations: No  . Attends Archivist Meetings: Never  . Marital Status: Married    Tobacco Counseling Counseling given: Not Answered   Clinical Intake:  Pre-visit preparation completed: Yes  Pain : No/denies pain     BMI - recorded: 33.46 Nutritional Status: BMI > 30   Obese Nutritional Risks: None Diabetes: No  How often do you need to have someone help you when you read instructions, pamphlets, or other written materials from your doctor or pharmacy?: 1 - Never  Diabetic?No  Interpreter Needed?: No  Information entered by :: Charlott Rakes, LPN   Activities of Daily Living In your present state of health, do you have any difficulty performing the following activities: 09/08/2020  Hearing? Y  Vision? N  Difficulty concentrating or making decisions? N  Walking or climbing stairs? N  Dressing or bathing? N  Doing errands, shopping? N  Preparing Food and eating ? N  Using the Toilet? N  In the past six months, have you accidently leaked urine? N  Do you have problems with loss of bowel control? N  Managing your Medications? N  Managing your Finances? N  Housekeeping or managing your Housekeeping? N  Some recent data might be hidden    Patient Care Team: [name], MD as PCP - General (Family Medicine) Nicholas Lose, MD as Referring Physician (Oncology) Rosalene Billings, MD as Consulting Physician (Neurology) [place], Marvia Pickles, MD as Consulting Physician (Ophthalmology) Liz Malady Diprincipe, MD as Consulting Physician (Endocrinology) Govert, Natale Lay, MD as Consulting Physician (Pulmonary Disease) [place], Adela Simmie Davies, MD as Consulting Physician (Dermatology) Cephas Darby, Lenetta Quaker, MD as Consulting Physician (Gastroenterology)  Indicate any recent Medical Services you may have received from other than [facility] providers in the past year (date may be approximate).     Assessment:   This is a routine wellness examination for Enrique.  Hearing/Vision screen  Hearing Screening   125Hz  250Hz  500Hz  1000Hz  2000Hz  3000Hz  4000Hz  6000Hz  8000Hz   Right ear:           Left ear:           Comments: Pt frequency hearing loss   Vision Screening Comments: Pt follows up with [facility] for annual eye exams   Dietary issues and  exercise activities discussed: Current Exercise Habits: Home exercise routine, Type of exercise: walking, Time (Minutes): 30, Frequency (Times/Week): 3, Weekly Exercise (Minutes/Week): 90  Goals    . Exercise 150 min/wk Moderate Activity     [facility] team has asked him to walk Has a treadmill at home Walk outside where you enjoy or have a passion. May find a buddy Set a goal     .  Patient Stated     Lose weight       Depression Screen PHQ 2/9 Scores 09/08/2020 08/26/2020 01/05/2020 07/27/2019 05/06/2019 09/24/2018 03/22/2017  PHQ - 2 Score 0 0 0 0 0 0 0    Fall Risk Fall Risk  09/08/2020 08/26/2020 06/14/2020 05/06/2019 09/24/2018  Falls in the past year? 0 0 0 0 0  Comment - - - - -  Number falls in past yr: 0 0 0 - -  Injury with Fall? 0 0 0 0 0  Risk for fall due to : Impaired vision - - - -  Follow up Falls prevention discussed - - Falls evaluation completed;Education provided;Falls prevention discussed -    FALL RISK PREVENTION PERTAINING TO THE HOME:   Home free of loose throw rugs in walkways, pet beds, electrical cords, etc? Yes  Adequate lighting in your home to reduce risk of falls? Yes   ASSISTIVE DEVICES UTILIZED TO PREVENT FALLS:  Life alert? No  Use of a cane, walker or w/c? No   TIMED UP AND GO:  Was the test performed? No .      Cognitive Function:     6CIT Screen 09/08/2020 05/06/2019  What Year? 0 points 0 points  What month? 0 points 0 points  What time? - 0 points  Count back from 20 0 points 0 points  Months in reverse 0 points 0 points  Repeat phrase 0 points 0 points  Total Score - 0    Immunizations Immunization History  Administered Date(s) Administered  . DTaP / HiB / IPV 12/04/2017, 03/05/2018, 08/27/2018  . Fluad Quad(high Dose 65+) 03/17/2020  . Hepatitis A 12/25/2005, 11/25/2007  . Hepatitis B 05/25/1996, 06/25/1997, 12/01/1998  . Hepatitis B, adult 12/04/2017, 03/05/2018, 08/27/2018  . Influenza, High Dose Seasonal PF 07/09/2017,  04/16/2018, 02/17/2019  . Influenza,inj,Quad PF,6+ Mos 02/18/2013, 05/03/2014  . Influenza-Unspecified 03/16/2015  . MMR 08/26/2020  . PFIZER Comirnaty(Gray Top)Covid-19 Tri-Sucrose Vaccine 08/04/2020  . PFIZER(Purple Top)SARS-COV-2 Vaccination 07/28/2019, 08/20/2019, 02/10/2020, 07/26/2020  . PPD Test 08/15/2012  . Pneumococcal Conjugate-13 05/11/2015, 12/04/2017, 03/05/2018, 08/27/2018  . Pneumococcal Polysaccharide-23 05/03/2014, 05/09/2020  . Td 12/05/2005  . Tdap 08/27/2018  . Zoster 02/15/2012    TDAP status: Up to date  Flu Vaccine status: Up to date  Pneumococcal vaccine status: Up to date  Covid-19 vaccine status: Completed vaccines  Qualifies for Shingles Vaccine? Yes   Zostavax completed Yes   Shingrix Completed?: No.    Education has been provided regarding the importance of this vaccine. Patient has been advised to call insurance company to determine out of pocket expense if they have not yet received this vaccine. Advised may also receive vaccine at local pharmacy or Health Dept. Verbalized acceptance and understanding.  Screening Tests Health Maintenance  Topic Date Due  . COLONOSCOPY (Pts 45-30yr Insurance coverage will need to be confirmed)  11/19/2023  . TETANUS/TDAP  08/26/2028  . INFLUENZA VACCINE  Completed  . COVID-19 Vaccine  Completed  . Hepatitis C Screening  Completed  . PNA vac Low Risk Adult  Completed  . HPV VACCINES  Aged Out    Health Maintenance  There are no preventive care reminders to display for this patient.  Colorectal cancer screening: Type of screening: Colonoscopy. Completed 11/19/18. Repeat every 5 years   Additional Screening:  Hepatitis C Screening:  Completed 11/02/15  Vision Screening: Recommended annual ophthalmology exams for early detection of glaucoma and other disorders of the eye. Is the patient up to date with their  annual eye exam?  Yes  Who is the provider or what is the name of the office in which the patient  attends annual eye exams? [facility] providers If pt is not established with a provider, would they like to be referred to a provider to establish care? No .   Dental Screening: Recommended annual dental exams for proper oral hygiene  Community Resource Referral / Chronic Care Management: CRR required this visit?  No   CCM required this visit?  No      Plan:     I have personally reviewed and noted the following in the patient's chart:   . Medical and social history . Use of alcohol, tobacco or illicit drugs  . Current medications and supplements . Functional ability and status . Nutritional status . Physical activity . Advanced directives . List of other physicians . Hospitalizations, surgeries, and ER visits in previous 12 months . Vitals . Screenings to include cognitive, depression, and falls . Referrals and appointments  In addition, I have reviewed and discussed with patient certain preventive protocols, quality metrics, and best practice recommendations. A written personalized care plan for preventive services as well as general preventive health recommendations were provided to patient.     Willette Brace, LPN   08/03/1066   Nurse Notes: None

## 2020-09-08 NOTE — Patient Instructions (Addendum)
[name] , Thank you for taking time to come for your Medicare Wellness Visit. I appreciate your ongoing commitment to your health goals. Please review the following plan we discussed and let me know if I can assist you in the future.   Screening recommendations/referrals: Colonoscopy: Done 11/19/18 Recommended yearly ophthalmology/optometry visit for glaucoma screening and checkup Recommended yearly dental visit for hygiene and checkup  Vaccinations: Influenza vaccine: Up to date Pneumococcal vaccine: Up to date Tdap vaccine: Up to date Shingles vaccine: Shingrix. Please contact your pharmacy for coverage information.    Covid-19: Completed 2/2, 2/25, 02/10/20, 07/26/20 & 08/04/20  Advanced directives: Please bring a copy of your health care power of attorney and living will to the office at your convenience.  Conditions/risks identified: Lose weight   Next appointment: Follow up in one year for your annual wellness visit.   Preventive Care 71 Years and Older, Male Preventive care refers to lifestyle choices and visits with your health care provider that can promote health and wellness. What does preventive care include?  A yearly physical exam. This is also called an annual well check.  Dental exams once or twice a year.  Routine eye exams. Ask your health care provider how often you should have your eyes checked.  Personal lifestyle choices, including:  Daily care of your teeth and gums.  Regular physical activity.  Eating a healthy diet.  Avoiding tobacco and drug use.  Limiting alcohol use.  Practicing safe sex.  Taking low doses of aspirin every day.  Taking vitamin and mineral supplements as recommended by your health care provider. What happens during an annual well check? The services and screenings done by your health care provider during your annual well check will depend on your age, overall health, lifestyle risk factors, and family history of  disease. Counseling  Your health care provider may ask you questions about your:  Alcohol use.  Tobacco use.  Drug use.  Emotional well-being.  Home and relationship well-being.  Sexual activity.  Eating habits.  History of falls.  Memory and ability to understand (cognition).  Work and work Statistician. Screening  You may have the following tests or measurements:  Height, weight, and BMI.  Blood pressure.  Lipid and cholesterol levels. These may be checked every 5 years, or more frequently if you are over 71 years old.  Skin check.  Lung cancer screening. You may have this screening every year starting at age 71 if you have a 30-pack-year history of smoking and currently smoke or have quit within the past 15 years.  Fecal occult blood test (FOBT) of the stool. You may have this test every year starting at age 71.  Flexible sigmoidoscopy or colonoscopy. You may have a sigmoidoscopy every 5 years or a colonoscopy every 10 years starting at age 71.  Prostate cancer screening. Recommendations will vary depending on your family history and other risks.  Hepatitis C blood test.  Hepatitis B blood test.  Sexually transmitted disease (STD) testing.  Diabetes screening. This is done by checking your blood sugar (glucose) after you have not eaten for a while (fasting). You may have this done every 1-3 years.  Abdominal aortic aneurysm (AAA) screening. You may need this if you are a current or former smoker.  Osteoporosis. You may be screened starting at age 2 if you are at high risk. Talk with your health care provider about your test results, treatment options, and if necessary, the need for more tests. Vaccines  Your health care provider may recommend certain vaccines, such as:  Influenza vaccine. This is recommended every year.  Tetanus, diphtheria, and acellular pertussis (Tdap, Td) vaccine. You may need a Td booster every 10 years.  Zoster vaccine. You may  need this after age 71.  Pneumococcal 13-valent conjugate (PCV13) vaccine. One dose is recommended after age 71.  Pneumococcal polysaccharide (PPSV23) vaccine. One dose is recommended after age 71. Talk to your health care provider about which screenings and vaccines you need and how often you need them. This information is not intended to replace advice given to you by your health care provider. Make sure you discuss any questions you have with your health care provider. Document Released: 07/08/2015 Document Revised: 02/29/2016 Document Reviewed: 04/12/2015 Elsevier Interactive Patient Education  2017 [place] Prevention in the Home Falls can cause injuries. They can happen to people of all ages. There are many things you can do to make your home safe and to help prevent falls. What can I do on the outside of my home?  Regularly fix the edges of walkways and driveways and fix any cracks.  Remove anything that might make you trip as you walk through a door, such as a raised step or threshold.  Trim any bushes or trees on the path to your home.  Use bright outdoor lighting.  Clear any walking paths of anything that might make someone trip, such as rocks or tools.  Regularly check to see if handrails are loose or broken. Make sure that both sides of any steps have handrails.  Any raised decks and porches should have guardrails on the edges.  Have any leaves, snow, or ice cleared regularly.  Use sand or salt on walking paths during winter.  Clean up any spills in your garage right away. This includes oil or grease spills. What can I do in the bathroom?  Use night lights.  Install grab bars by the toilet and in the tub and shower. Do not use towel bars as grab bars.  Use non-skid mats or decals in the tub or shower.  If you need to sit down in the shower, use a plastic, non-slip stool.  Keep the floor dry. Clean up any water that spills on the floor as soon as it  happens.  Remove soap buildup in the tub or shower regularly.  Attach bath mats securely with double-sided non-slip rug tape.  Do not have throw rugs and other things on the floor that can make you trip. What can I do in the bedroom?  Use night lights.  Make sure that you have a light by your bed that is easy to reach.  Do not use any sheets or blankets that are too big for your bed. They should not hang down onto the floor.  Have a firm chair that has side arms. You can use this for support while you get dressed.  Do not have throw rugs and other things on the floor that can make you trip. What can I do in the kitchen?  Clean up any spills right away.  Avoid walking on wet floors.  Keep items that you use a lot in easy-to-reach places.  If you need to reach something above you, use a strong step stool that has a grab bar.  Keep electrical cords out of the way.  Do not use floor polish or wax that makes floors slippery. If you must use wax, use non-skid floor wax.  Do  not have throw rugs and other things on the floor that can make you trip. What can I do with my stairs?  Do not leave any items on the stairs.  Make sure that there are handrails on both sides of the stairs and use them. Fix handrails that are broken or loose. Make sure that handrails are as long as the stairways.  Check any carpeting to make sure that it is firmly attached to the stairs. Fix any carpet that is loose or worn.  Avoid having throw rugs at the top or bottom of the stairs. If you do have throw rugs, attach them to the floor with carpet tape.  Make sure that you have a light switch at the top of the stairs and the bottom of the stairs. If you do not have them, ask someone to add them for you. What else can I do to help prevent falls?  Wear shoes that:  Do not have high heels.  Have rubber bottoms.  Are comfortable and fit you well.  Are closed at the toe. Do not wear sandals.  If you  use a stepladder:  Make sure that it is fully opened. Do not climb a closed stepladder.  Make sure that both sides of the stepladder are locked into place.  Ask someone to hold it for you, if possible.  Clearly mark and make sure that you can see:  Any grab bars or handrails.  First and last steps.  Where the edge of each step is.  Use tools that help you move around (mobility aids) if they are needed. These include:  Canes.  Walkers.  Scooters.  Crutches.  Turn on the lights when you go into a dark area. Replace any light bulbs as soon as they burn out.  Set up your furniture so you have a clear path. Avoid moving your furniture around.  If any of your floors are uneven, fix them.  If there are any pets around you, be aware of where they are.  Review your medicines with your doctor. Some medicines can make you feel dizzy. This can increase your chance of falling. Ask your doctor what other things that you can do to help prevent falls. This information is not intended to replace advice given to you by your health care provider. Make sure you discuss any questions you have with your health care provider. Document Released: 04/07/2009 Document Revised: 11/17/2015 Document Reviewed: 07/16/2014 Elsevier Interactive Patient Education  2017 [employer].

## 2020-09-12 ENCOUNTER — Ambulatory Visit: Payer: Medicare Other

## 2020-09-12 ENCOUNTER — Ambulatory Visit: Payer: Medicare Other | Admitting: Family Medicine

## 2020-09-28 DIAGNOSIS — T380X5D Adverse effect of glucocorticoids and synthetic analogues, subsequent encounter: Secondary | ICD-10-CM | POA: Diagnosis not present

## 2020-09-28 DIAGNOSIS — D469 Myelodysplastic syndrome, unspecified: Secondary | ICD-10-CM | POA: Diagnosis not present

## 2020-09-28 DIAGNOSIS — Z9484 Stem cells transplant status: Secondary | ICD-10-CM | POA: Diagnosis not present

## 2020-09-28 DIAGNOSIS — E2749 Other adrenocortical insufficiency: Secondary | ICD-10-CM | POA: Diagnosis not present

## 2020-09-28 DIAGNOSIS — G4733 Obstructive sleep apnea (adult) (pediatric): Secondary | ICD-10-CM | POA: Diagnosis not present

## 2020-09-28 DIAGNOSIS — Z79899 Other long term (current) drug therapy: Secondary | ICD-10-CM | POA: Diagnosis not present

## 2020-09-28 DIAGNOSIS — I10 Essential (primary) hypertension: Secondary | ICD-10-CM | POA: Diagnosis not present

## 2020-09-28 DIAGNOSIS — Z8579 Personal history of other malignant neoplasms of lymphoid, hematopoietic and related tissues: Secondary | ICD-10-CM | POA: Diagnosis not present

## 2020-09-28 DIAGNOSIS — Z8673 Personal history of transient ischemic attack (TIA), and cerebral infarction without residual deficits: Secondary | ICD-10-CM | POA: Diagnosis not present

## 2020-09-28 DIAGNOSIS — D84821 Immunodeficiency due to drugs: Secondary | ICD-10-CM | POA: Diagnosis not present

## 2020-09-28 DIAGNOSIS — Z8619 Personal history of other infectious and parasitic diseases: Secondary | ICD-10-CM | POA: Diagnosis not present

## 2020-09-28 DIAGNOSIS — Z7982 Long term (current) use of aspirin: Secondary | ICD-10-CM | POA: Diagnosis not present

## 2020-09-28 DIAGNOSIS — Z9221 Personal history of antineoplastic chemotherapy: Secondary | ICD-10-CM | POA: Diagnosis not present

## 2020-09-28 DIAGNOSIS — Z9481 Bone marrow transplant status: Secondary | ICD-10-CM | POA: Diagnosis not present

## 2020-09-28 DIAGNOSIS — Z7952 Long term (current) use of systemic steroids: Secondary | ICD-10-CM | POA: Diagnosis not present

## 2020-09-28 DIAGNOSIS — G608 Other hereditary and idiopathic neuropathies: Secondary | ICD-10-CM | POA: Diagnosis not present

## 2020-09-28 DIAGNOSIS — C9201 Acute myeloblastic leukemia, in remission: Secondary | ICD-10-CM | POA: Diagnosis not present

## 2020-09-28 DIAGNOSIS — Z08 Encounter for follow-up examination after completed treatment for malignant neoplasm: Secondary | ICD-10-CM | POA: Diagnosis not present

## 2020-09-28 DIAGNOSIS — Z7984 Long term (current) use of oral hypoglycemic drugs: Secondary | ICD-10-CM | POA: Diagnosis not present

## 2020-09-28 DIAGNOSIS — H04129 Dry eye syndrome of unspecified lacrimal gland: Secondary | ICD-10-CM | POA: Diagnosis not present

## 2020-09-28 DIAGNOSIS — R739 Hyperglycemia, unspecified: Secondary | ICD-10-CM | POA: Diagnosis not present

## 2020-09-28 DIAGNOSIS — T8609 Other complications of bone marrow transplant: Secondary | ICD-10-CM | POA: Diagnosis not present

## 2020-09-28 DIAGNOSIS — Z86718 Personal history of other venous thrombosis and embolism: Secondary | ICD-10-CM | POA: Diagnosis not present

## 2020-09-28 DIAGNOSIS — E2740 Unspecified adrenocortical insufficiency: Secondary | ICD-10-CM | POA: Diagnosis not present

## 2020-10-26 ENCOUNTER — Other Ambulatory Visit: Payer: Self-pay | Admitting: Family Medicine

## 2020-11-02 DIAGNOSIS — D469 Myelodysplastic syndrome, unspecified: Secondary | ICD-10-CM | POA: Diagnosis not present

## 2020-11-02 DIAGNOSIS — Z5181 Encounter for therapeutic drug level monitoring: Secondary | ICD-10-CM | POA: Diagnosis not present

## 2020-11-02 DIAGNOSIS — R509 Fever, unspecified: Secondary | ICD-10-CM | POA: Diagnosis not present

## 2020-11-02 DIAGNOSIS — Z9484 Stem cells transplant status: Secondary | ICD-10-CM | POA: Diagnosis not present

## 2020-11-02 DIAGNOSIS — Z9481 Bone marrow transplant status: Secondary | ICD-10-CM | POA: Diagnosis not present

## 2020-11-14 DIAGNOSIS — R739 Hyperglycemia, unspecified: Secondary | ICD-10-CM | POA: Diagnosis not present

## 2020-11-14 DIAGNOSIS — E118 Type 2 diabetes mellitus with unspecified complications: Secondary | ICD-10-CM | POA: Diagnosis not present

## 2020-11-14 DIAGNOSIS — I10 Essential (primary) hypertension: Secondary | ICD-10-CM | POA: Diagnosis not present

## 2020-11-14 DIAGNOSIS — E782 Mixed hyperlipidemia: Secondary | ICD-10-CM | POA: Diagnosis not present

## 2020-11-14 DIAGNOSIS — T380X5D Adverse effect of glucocorticoids and synthetic analogues, subsequent encounter: Secondary | ICD-10-CM | POA: Diagnosis not present

## 2020-11-14 DIAGNOSIS — E2740 Unspecified adrenocortical insufficiency: Secondary | ICD-10-CM | POA: Diagnosis not present

## 2020-11-14 DIAGNOSIS — Z794 Long term (current) use of insulin: Secondary | ICD-10-CM | POA: Diagnosis not present

## 2020-11-14 DIAGNOSIS — T380X5A Adverse effect of glucocorticoids and synthetic analogues, initial encounter: Secondary | ICD-10-CM | POA: Diagnosis not present

## 2020-11-14 LAB — HEMOGLOBIN A1C: Hemoglobin A1C: 6

## 2020-11-25 DIAGNOSIS — D469 Myelodysplastic syndrome, unspecified: Secondary | ICD-10-CM | POA: Diagnosis not present

## 2020-11-25 DIAGNOSIS — R942 Abnormal results of pulmonary function studies: Secondary | ICD-10-CM | POA: Diagnosis not present

## 2020-11-25 DIAGNOSIS — D89811 Chronic graft-versus-host disease: Secondary | ICD-10-CM | POA: Diagnosis not present

## 2020-11-25 DIAGNOSIS — T8609 Other complications of bone marrow transplant: Secondary | ICD-10-CM | POA: Diagnosis not present

## 2020-12-12 DIAGNOSIS — L821 Other seborrheic keratosis: Secondary | ICD-10-CM | POA: Diagnosis not present

## 2020-12-12 DIAGNOSIS — L570 Actinic keratosis: Secondary | ICD-10-CM | POA: Diagnosis not present

## 2020-12-12 DIAGNOSIS — L820 Inflamed seborrheic keratosis: Secondary | ICD-10-CM | POA: Diagnosis not present

## 2020-12-12 DIAGNOSIS — L720 Epidermal cyst: Secondary | ICD-10-CM | POA: Diagnosis not present

## 2020-12-12 DIAGNOSIS — D2271 Melanocytic nevi of right lower limb, including hip: Secondary | ICD-10-CM | POA: Diagnosis not present

## 2020-12-12 DIAGNOSIS — D485 Neoplasm of uncertain behavior of skin: Secondary | ICD-10-CM | POA: Diagnosis not present

## 2020-12-12 DIAGNOSIS — Z85828 Personal history of other malignant neoplasm of skin: Secondary | ICD-10-CM | POA: Diagnosis not present

## 2020-12-12 DIAGNOSIS — L814 Other melanin hyperpigmentation: Secondary | ICD-10-CM | POA: Diagnosis not present

## 2020-12-26 ENCOUNTER — Encounter: Payer: Self-pay | Admitting: Family Medicine

## 2021-01-09 ENCOUNTER — Encounter: Payer: Self-pay | Admitting: Family Medicine

## 2021-01-16 NOTE — Telephone Encounter (Signed)
Pt says that he does not know what his Dr would want to order specifically, and says that states that he will follow up in office tomorrow.

## 2021-01-17 ENCOUNTER — Encounter: Payer: Self-pay | Admitting: Family Medicine

## 2021-01-17 ENCOUNTER — Ambulatory Visit (INDEPENDENT_AMBULATORY_CARE_PROVIDER_SITE_OTHER): Payer: Medicare Other | Admitting: Family Medicine

## 2021-01-17 ENCOUNTER — Other Ambulatory Visit: Payer: Self-pay

## 2021-01-17 VITALS — BP 118/73 | HR 60 | Temp 98.0°F | Ht 70.0 in | Wt 220.2 lb

## 2021-01-17 DIAGNOSIS — T380X5A Adverse effect of glucocorticoids and synthetic analogues, initial encounter: Secondary | ICD-10-CM | POA: Diagnosis not present

## 2021-01-17 DIAGNOSIS — E785 Hyperlipidemia, unspecified: Secondary | ICD-10-CM

## 2021-01-17 DIAGNOSIS — D89813 Graft-versus-host disease, unspecified: Secondary | ICD-10-CM

## 2021-01-17 DIAGNOSIS — I10 Essential (primary) hypertension: Secondary | ICD-10-CM | POA: Diagnosis not present

## 2021-01-17 DIAGNOSIS — G629 Polyneuropathy, unspecified: Secondary | ICD-10-CM | POA: Diagnosis not present

## 2021-01-17 DIAGNOSIS — R739 Hyperglycemia, unspecified: Secondary | ICD-10-CM | POA: Diagnosis not present

## 2021-01-17 DIAGNOSIS — Z8673 Personal history of transient ischemic attack (TIA), and cerebral infarction without residual deficits: Secondary | ICD-10-CM

## 2021-01-17 LAB — COMPREHENSIVE METABOLIC PANEL
ALT: 28 U/L (ref 0–53)
AST: 22 U/L (ref 0–37)
Albumin: 3.9 g/dL (ref 3.5–5.2)
Alkaline Phosphatase: 86 U/L (ref 39–117)
BUN: 15 mg/dL (ref 6–23)
CO2: 27 mEq/L (ref 19–32)
Calcium: 9.5 mg/dL (ref 8.4–10.5)
Chloride: 103 mEq/L (ref 96–112)
Creatinine, Ser: 1.07 mg/dL (ref 0.40–1.50)
GFR: 69.87 mL/min (ref >60.00)
Glucose, Bld: 99 mg/dL (ref 70–99)
Potassium: 4.2 mEq/L (ref 3.5–5.1)
Sodium: 137 mEq/L (ref 135–145)
Total Bilirubin: 0.5 mg/dL (ref 0.2–1.2)
Total Protein: 7.4 g/dL (ref 6.0–8.3)

## 2021-01-17 LAB — CBC WITH DIFFERENTIAL/PLATELET
Basophils Absolute: 0 10*3/uL (ref 0.0–0.1)
Basophils Relative: 0.4 % (ref 0.0–3.0)
Eosinophils Absolute: 0.2 10*3/uL (ref 0.0–0.7)
Eosinophils Relative: 1.6 % (ref 0.0–5.0)
HCT: 39.9 % (ref 39.0–52.0)
Hemoglobin: 13 g/dL (ref 13.0–17.0)
Lymphocytes Relative: 31 % (ref 12.0–46.0)
Lymphs Abs: 3.2 10*3/uL (ref 0.7–4.0)
MCHC: 32.5 g/dL (ref 30.0–36.0)
MCV: 89.5 fl (ref 78.0–100.0)
Monocytes Absolute: 1.3 10*3/uL — ABNORMAL HIGH (ref 0.1–1.0)
Monocytes Relative: 12.1 % — ABNORMAL HIGH (ref 3.0–12.0)
Neutro Abs: 5.7 10*3/uL (ref 1.4–7.7)
Neutrophils Relative %: 54.9 % (ref 43.0–77.0)
Platelets: 346 10*3/uL (ref 150.0–400.0)
RBC: 4.46 Mil/uL (ref 4.22–5.81)
RDW: 17.8 % — ABNORMAL HIGH (ref 11.5–15.5)
WBC: 10.5 10*3/uL (ref 4.0–10.5)

## 2021-01-17 NOTE — Progress Notes (Signed)
Phone [phone] In person visit   Subjective:   Brandon [name] is a 70 y.o. year old very pleasant male patient who presents for/with See problem oriented charting Chief Complaint  Patient presents with   Hyperlipidemia   Hypertension    This visit occurred during the SARS-CoV-2 public health emergency.  Safety protocols were in place, including screening questions prior to the visit, additional usage of staff PPE, and extensive cleaning of exam room while observing appropriate contact time as indicated for disinfecting solutions.   Past Medical History-  Patient Active Problem List   Diagnosis Date Noted   Allergic rhinitis 10/03/2019    Priority: High   Pancreatic lesion 10/03/2019    Priority: High   History of CVA (cerebrovascular accident) 03/10/2019    Priority: High   Adrenocortical insufficiency ([place]) 08/06/2017    Priority: High   GVHD (graft versus host disease) ([place]) 03/04/2017    Priority: High   History of myelodysplastic syndrome 12/06/2015    Priority: High   Steroid-induced hyperglycemia 08/16/2014    Priority: High   Fatty liver 01/05/2020    Priority: Medium   Osteopenia 10/05/2019    Priority: Medium   History of DVT (deep vein thrombosis) 09/25/2018    Priority: Medium   Post chemotherapy Dry eyes due to decreased tear production 08/06/2017    Priority: Medium   post chemotherapy Peripheral neuropathy 08/06/2017    Priority: Medium   BPH associated with nocturia 11/15/2014    Priority: Medium   Hyperlipidemia 02/04/2007    Priority: Medium   Essential hypertension 02/04/2007    Priority: Medium   History of stem cell transplant ([place]) 03/04/2017    Priority: Low   Leukopenia 11/15/2014    Priority: Low   Chest pain 08/13/2014    Priority: Low   GERD (gastroesophageal reflux disease) 05/03/2014    Priority: Low   Cervical disc disorder with radiculopathy of cervical region 07/14/2010    Priority: [place] LOSS BILATERAL  07/14/2010    Priority: Low   ERECTILE DYSFUNCTION 02/07/2007    Priority: Low    Medications- reviewed and updated Current Outpatient Medications  Medication Sig Dispense Refill   acyclovir (ZOVIRAX) 400 MG tablet Take 400 mg by mouth 2 (two) times daily.     albuterol (VENTOLIN HFA) 108 (90 Base) MCG/ACT inhaler Inhale into the lungs in the morning and at bedtime.     Alpha-Lipoic Acid 600 MG CAPS Take by mouth.     amLODipine (NORVASC) 10 MG tablet TAKE 1 TABLET BY MOUTH EVERY DAY 90 tablet 3   amoxicillin (AMOXIL) 500 MG capsule SMARTSIG:4 Capsule(s) By Mouth     aspirin 325 MG EC tablet Take 325 mg by mouth daily.     Cholecalciferol (VITAMIN D) 50 MCG (2000 UT) CAPS Take 2,000 Units by mouth at bedtime.      fluticasone (FLOVENT HFA) 220 MCG/ACT inhaler Inhale 1 puff into the lungs 2 (two) times daily.     hydrocortisone (CORTEF) 10 MG tablet Take by mouth.     hydrocortisone (CORTEF) 20 MG tablet Take 25 mg by mouth daily.     Lancets (ONETOUCH DELICA PLUS [id number]) MISC      loratadine (CLARITIN) 10 MG tablet Take 10 mg by mouth daily.     Magnesium Oxide 400 MG CAPS Take 1 capsule (400 mg total) by mouth 5 (five) times daily. (Patient taking differently: Take 400 mg by mouth daily.)     metFORMIN (GLUCOPHAGE) 500  MG tablet Take 500 mg by mouth 2 (two) times daily.     metoprolol tartrate (LOPRESSOR) 25 MG tablet Take 1 tablet by mouth 2 (two) times daily.     montelukast (SINGULAIR) 10 MG tablet Take 10 mg by mouth daily.      Multiple Vitamin (MULTI-VITAMIN) tablet Take by mouth.     niacinamide 500 MG tablet Take 500 mg by mouth 2 (two) times daily with a meal.     omeprazole (PRILOSEC) 10 MG capsule TAKE 1 CAPSULE BY MOUTH EVERY DAY (Patient taking differently: 20 mg.) 90 capsule 1   ondansetron (ZOFRAN-ODT) 8 MG disintegrating tablet Take 8 mg by mouth every 8 (eight) hours as needed for nausea or vomiting.     polyethylene glycol (MIRALAX / GLYCOLAX) 17 g packet Take 1  packet by mouth daily as needed (constipation).      polyvinyl alcohol (LIQUIFILM TEARS) 1.4 % ophthalmic solution Place 1 drop into both eyes as needed for dry eyes.     rosuvastatin (CRESTOR) 40 MG tablet Take 1 tablet (40 mg total) by mouth daily. 90 tablet 3   senna-docusate (SENOKOT-S) 8.6-50 MG tablet Take 2 tablets by mouth at bedtime.      famotidine (PEPCID) 20 MG tablet TAKE 1 TABLET BY MOUTH EVERY DAY AT NIGHT (Patient not taking: Reported on 01/17/2021) 90 tablet 3   irbesartan (AVAPRO) 75 MG tablet Take 0.5 tablets (37.5 mg total) by mouth daily. (Patient not taking: No sig reported) 45 tablet 3   sildenafil (REVATIO) 20 MG tablet Take 2-5 tablets as needed once every 48 hours for erectile dysfunction (Patient not taking: No sig reported) 50 tablet 3   No current facility-administered medications for this visit.     Objective:  BP 118/73   Pulse 60   Temp 98 F (36.7 C) (Temporal)   Ht 5' 10"  (1.778 m)   Wt 220 lb 3.2 oz (99.9 kg)   SpO2 96%   BMI 31.60 kg/m  Gen: NAD, resting comfortably CV: RRR no murmurs rubs or gallops Lungs: CTAB no crackles, wheeze, rhonchi Abdomen: soft/nontender/nondistended/normal bowel sounds.  Ext: no edema Skin: warm, dry    Assessment and Plan   #covid 19 around July 4th- thankfully patient did well due to vaccinations, evusheld, and paxlovid- recovered within 3-4 days and overall mild. Was anxiety provoking. Wife still with lingering cough  #GVHD- pulmonary improvement on recent lung function studies. Still on singulair and flovent. Also has albuterol has needed  #hypertension S: medication: amlodipine 10 mg, metoprolol 25 mg BID,  Irbesartan 37.5 mg Home readings #s: good readings BP Readings from Last 3 Encounters:  01/17/21 118/73  08/26/20 114/62  06/14/20 128/78  A/P: Stable. Continue current medications.   #hyperlipidemia/ History of CVA S: Medication:rosuvastatin 40 mg  Lab Results  Component Value Date   CHOL 128  08/22/2020   HDL 44 08/22/2020   LDLCALC 59 08/22/2020   LDLDIRECT 77 02/17/2020   TRIG 171 (H) 08/22/2020   CHOLHDL 2.9 08/22/2020   A/P: LDL goal under 70- at goal and not quite due for lipid panel- consider next visit  # Steroid Induced Hyperglycemia- also sees endocrine [facility] Sanjuana [facility]ava [facility] for adrenal suppression issues with ongoing steroids S:  Medication: metformin 500 mg twice daily  Sugar 89 this AM Exercise and diet- down 13 lbs on our scales- trying to eat healthier Lab Results  Component Value Date   HGBA1C 6.0 11/14/2020   HGBA1C 6.7 (H) 08/22/2020   HGBA1C  6.2 (H) 02/17/2020   A/P: reasonably controlled when checked most recently-too soon for repeat a1c  # Neuropathy- post chemotherapy. He does get some discomfort walking - states this is his biggest complaint- feels it in his thighs at times. Trial off statin did not help. Even some in fingertips but slightly better. Alpha lipoic acid has been trialed - pain is not the most severe issue Lab Results  Component Value Date   [id number] 820 02/17/2020   #HM- MMR - could not get at local pharmacy - did get this 08/26/2020 (but he got charged for this). Tdap was needed in the past - 08/27/2018 was last on record - will get future vaccines at [facility] and that will get covered. He is going to consider shingrix at [facility]- we could try to code this with their guidance if needed Immunization History  Administered Date(s) Administered   DTaP / HiB / IPV 12/04/2017, 03/05/2018, 08/27/2018   Fluad Quad(high Dose 65+) 03/17/2020   Hepatitis A 12/25/2005, 11/25/2007   Hepatitis B 05/25/1996, 06/25/1997, 12/01/1998   Hepatitis B, adult 12/04/2017, 03/05/2018, 08/27/2018   Influenza, High Dose Seasonal PF 07/09/2017, 04/16/2018, 02/17/2019   Influenza,inj,Quad PF,6+ Mos 02/18/2013, 05/03/2014   Influenza-Unspecified 03/16/2015   MMR 08/26/2020   PFIZER Comirnaty(Gray Top)Covid-19 Tri-Sucrose Vaccine 08/04/2020   PFIZER(Purple  Top)SARS-COV-2 Vaccination 07/28/2019, 08/20/2019, 02/10/2020, 07/26/2020   PPD Test 08/15/2012   Pneumococcal Conjugate-13 05/11/2015, 12/04/2017, 03/05/2018, 08/27/2018   Pneumococcal Polysaccharide-23 05/03/2014, 05/09/2020   Td 12/05/2005   Tdap 08/27/2018   Zoster, Live 02/15/2012   Recommended follow up: Return in about 4 months (around 05/20/2021) for follow-up or sooner if needed.  Lab/Order associations:   ICD-10-CM   1. Essential hypertension  I10     2. Hyperlipidemia, unspecified hyperlipidemia type  E78.5     3. History of CVA (cerebrovascular accident)  Z86.73     4. GVHD (graft versus host disease) ([place])  D89.813     5. Peripheral polyneuropathy  G62.9      I,Harris Phan,acting as a scribe for Garret Reddish, MD.,have documented all relevant documentation on the behalf of Garret Reddish, MD,as directed by  Garret Reddish, MD while in the presence of Garret Reddish, MD.  I, Garret Reddish, MD, have reviewed all documentation for this visit. The documentation on 01/17/21 for the exam, diagnosis, procedures, and orders are all accurate and complete.  Return precautions advised.  Garret Reddish, MD

## 2021-01-17 NOTE — Patient Instructions (Addendum)
Health Maintenance Due  Topic Date Due   Zoster Vaccines- Shingrix (1 of 2) Patient will get this done at [facility].    Please get your vaccines through [facility] and have them fax the records over to [place]. Never done   Please stop by lab before you go If you have mychart- we will send your results within 3 business days of [place] receiving them.  If you do not have mychart- we will call you about results within 5 business days of [place] receiving them.  *please also note that you will see labs on mychart as soon as they post. I will later go in and write notes on them- will say "notes from Dr. Yong Channel"  No changes today. Glad you are doing well. Thrilled you did so well with covid!  Recommended follow up: Return in about 4 months (around 05/20/2021) for follow-up or sooner if needed.

## 2021-02-02 DIAGNOSIS — D044 Carcinoma in situ of skin of scalp and neck: Secondary | ICD-10-CM | POA: Diagnosis not present

## 2021-02-08 DIAGNOSIS — H04123 Dry eye syndrome of bilateral lacrimal glands: Secondary | ICD-10-CM | POA: Diagnosis not present

## 2021-02-08 DIAGNOSIS — H018 Other specified inflammations of eyelid: Secondary | ICD-10-CM | POA: Diagnosis not present

## 2021-02-13 ENCOUNTER — Other Ambulatory Visit: Payer: Self-pay | Admitting: Family Medicine

## 2021-02-13 DIAGNOSIS — Z794 Long term (current) use of insulin: Secondary | ICD-10-CM | POA: Diagnosis not present

## 2021-02-13 DIAGNOSIS — E118 Type 2 diabetes mellitus with unspecified complications: Secondary | ICD-10-CM | POA: Diagnosis not present

## 2021-02-13 DIAGNOSIS — T380X5A Adverse effect of glucocorticoids and synthetic analogues, initial encounter: Secondary | ICD-10-CM | POA: Diagnosis not present

## 2021-02-13 DIAGNOSIS — T380X5D Adverse effect of glucocorticoids and synthetic analogues, subsequent encounter: Secondary | ICD-10-CM | POA: Diagnosis not present

## 2021-02-13 DIAGNOSIS — R739 Hyperglycemia, unspecified: Secondary | ICD-10-CM | POA: Diagnosis not present

## 2021-02-15 ENCOUNTER — Encounter: Payer: Self-pay | Admitting: Family Medicine

## 2021-02-16 ENCOUNTER — Other Ambulatory Visit: Payer: Self-pay

## 2021-02-16 DIAGNOSIS — E1169 Type 2 diabetes mellitus with other specified complication: Secondary | ICD-10-CM

## 2021-02-17 ENCOUNTER — Other Ambulatory Visit (INDEPENDENT_AMBULATORY_CARE_PROVIDER_SITE_OTHER): Payer: Medicare Other

## 2021-02-17 ENCOUNTER — Other Ambulatory Visit: Payer: Self-pay

## 2021-02-17 DIAGNOSIS — E1169 Type 2 diabetes mellitus with other specified complication: Secondary | ICD-10-CM | POA: Diagnosis not present

## 2021-02-17 LAB — HEMOGLOBIN A1C: Hgb A1c MFr Bld: 6.7 % — ABNORMAL HIGH (ref 4.6–6.5)

## 2021-04-13 DIAGNOSIS — L570 Actinic keratosis: Secondary | ICD-10-CM | POA: Diagnosis not present

## 2021-04-13 DIAGNOSIS — Z85828 Personal history of other malignant neoplasm of skin: Secondary | ICD-10-CM | POA: Diagnosis not present

## 2021-04-19 DIAGNOSIS — M1990 Unspecified osteoarthritis, unspecified site: Secondary | ICD-10-CM | POA: Diagnosis not present

## 2021-04-19 DIAGNOSIS — Z9481 Bone marrow transplant status: Secondary | ICD-10-CM | POA: Diagnosis not present

## 2021-04-19 DIAGNOSIS — E2740 Unspecified adrenocortical insufficiency: Secondary | ICD-10-CM | POA: Diagnosis not present

## 2021-04-19 DIAGNOSIS — Z8673 Personal history of transient ischemic attack (TIA), and cerebral infarction without residual deficits: Secondary | ICD-10-CM | POA: Diagnosis not present

## 2021-04-19 DIAGNOSIS — E871 Hypo-osmolality and hyponatremia: Secondary | ICD-10-CM | POA: Diagnosis not present

## 2021-04-19 DIAGNOSIS — E099 Drug or chemical induced diabetes mellitus without complications: Secondary | ICD-10-CM | POA: Diagnosis not present

## 2021-04-19 DIAGNOSIS — Z86718 Personal history of other venous thrombosis and embolism: Secondary | ICD-10-CM | POA: Diagnosis not present

## 2021-04-19 DIAGNOSIS — T8609 Other complications of bone marrow transplant: Secondary | ICD-10-CM | POA: Diagnosis not present

## 2021-04-19 DIAGNOSIS — R600 Localized edema: Secondary | ICD-10-CM | POA: Diagnosis not present

## 2021-04-19 DIAGNOSIS — D89811 Chronic graft-versus-host disease: Secondary | ICD-10-CM | POA: Diagnosis not present

## 2021-04-19 DIAGNOSIS — Z7901 Long term (current) use of anticoagulants: Secondary | ICD-10-CM | POA: Diagnosis not present

## 2021-04-19 DIAGNOSIS — D89813 Graft-versus-host disease, unspecified: Secondary | ICD-10-CM | POA: Diagnosis not present

## 2021-04-19 DIAGNOSIS — K209 Esophagitis, unspecified without bleeding: Secondary | ICD-10-CM | POA: Diagnosis not present

## 2021-04-19 DIAGNOSIS — Z79899 Other long term (current) drug therapy: Secondary | ICD-10-CM | POA: Diagnosis not present

## 2021-04-19 DIAGNOSIS — K219 Gastro-esophageal reflux disease without esophagitis: Secondary | ICD-10-CM | POA: Diagnosis not present

## 2021-04-19 DIAGNOSIS — D469 Myelodysplastic syndrome, unspecified: Secondary | ICD-10-CM | POA: Diagnosis not present

## 2021-04-19 DIAGNOSIS — T380X5D Adverse effect of glucocorticoids and synthetic analogues, subsequent encounter: Secondary | ICD-10-CM | POA: Diagnosis not present

## 2021-04-19 DIAGNOSIS — E7800 Pure hypercholesterolemia, unspecified: Secondary | ICD-10-CM | POA: Diagnosis not present

## 2021-04-19 DIAGNOSIS — I10 Essential (primary) hypertension: Secondary | ICD-10-CM | POA: Diagnosis not present

## 2021-04-19 DIAGNOSIS — Z9484 Stem cells transplant status: Secondary | ICD-10-CM | POA: Diagnosis not present

## 2021-04-25 DIAGNOSIS — T380X5D Adverse effect of glucocorticoids and synthetic analogues, subsequent encounter: Secondary | ICD-10-CM | POA: Diagnosis not present

## 2021-04-25 DIAGNOSIS — R600 Localized edema: Secondary | ICD-10-CM | POA: Diagnosis not present

## 2021-04-25 DIAGNOSIS — E2740 Unspecified adrenocortical insufficiency: Secondary | ICD-10-CM | POA: Diagnosis not present

## 2021-04-25 DIAGNOSIS — Z9484 Stem cells transplant status: Secondary | ICD-10-CM | POA: Diagnosis not present

## 2021-04-25 DIAGNOSIS — D89813 Graft-versus-host disease, unspecified: Secondary | ICD-10-CM | POA: Diagnosis not present

## 2021-04-25 DIAGNOSIS — D469 Myelodysplastic syndrome, unspecified: Secondary | ICD-10-CM | POA: Diagnosis not present

## 2021-04-25 DIAGNOSIS — T8600 Unspecified complication of bone marrow transplant: Secondary | ICD-10-CM | POA: Diagnosis not present

## 2021-04-25 DIAGNOSIS — T865 Complications of stem cell transplant: Secondary | ICD-10-CM | POA: Diagnosis not present

## 2021-04-25 DIAGNOSIS — D89811 Chronic graft-versus-host disease: Secondary | ICD-10-CM | POA: Diagnosis not present

## 2021-04-25 DIAGNOSIS — E099 Drug or chemical induced diabetes mellitus without complications: Secondary | ICD-10-CM | POA: Diagnosis not present

## 2021-05-22 NOTE — Progress Notes (Signed)
Phone [phone] In person visit   Subjective:   Brandon [name] is a 70 y.o. year old very pleasant male patient who presents for/with See problem oriented charting Chief Complaint  Patient presents with   Follow-up    4 month follow-up TP Sinus issues all weekend Fasting     This visit occurred during the SARS-CoV-2 public health emergency.  Safety protocols were in place, including screening questions prior to the visit, additional usage of staff PPE, and extensive cleaning of exam room while observing appropriate contact time as indicated for disinfecting solutions.   Past Medical History-  Patient Active Problem List   Diagnosis Date Noted   Allergic rhinitis 10/03/2019    Priority: High   Pancreatic lesion 10/03/2019    Priority: High   History of CVA (cerebrovascular accident) 03/10/2019    Priority: High   Adrenal insufficiency ([place]) 08/06/2017    Priority: High   GVHD (graft versus host disease) ([place]) 03/04/2017    Priority: High   History of myelodysplastic syndrome 12/06/2015    Priority: High   Steroid-induced hyperglycemia 08/16/2014    Priority: High   Fatty liver 01/05/2020    Priority: Medium    Osteopenia 10/05/2019    Priority: Medium    History of DVT (deep vein thrombosis) 09/25/2018    Priority: Medium    Post chemotherapy Dry eyes due to decreased tear production 08/06/2017    Priority: Medium    post chemotherapy Peripheral neuropathy 08/06/2017    Priority: Medium    BPH associated with nocturia 11/15/2014    Priority: Medium    Hyperlipidemia 02/04/2007    Priority: Medium    Essential hypertension 02/04/2007    Priority: Medium    History of stem cell transplant ([place]) 03/04/2017    Priority: Low   Leukopenia 11/15/2014    Priority: Low   Chest pain 08/13/2014    Priority: Low   GERD (gastroesophageal reflux disease) 05/03/2014    Priority: Low   Cervical disc disorder with radiculopathy of cervical region 07/14/2010     Priority: [place] LOSS BILATERAL 07/14/2010    Priority: Low   ERECTILE DYSFUNCTION 02/07/2007    Priority: Low    Medications- reviewed and updated Current Outpatient Medications  Medication Sig Dispense Refill   acyclovir (ZOVIRAX) 400 MG tablet Take 400 mg by mouth 2 (two) times daily.     albuterol (VENTOLIN HFA) 108 (90 Base) MCG/ACT inhaler Inhale into the lungs in the morning and at bedtime.     Alpha-Lipoic Acid 600 MG CAPS Take by mouth.     amLODipine (NORVASC) 10 MG tablet TAKE 1 TABLET BY MOUTH EVERY DAY 90 tablet 3   amoxicillin (AMOXIL) 500 MG capsule SMARTSIG:4 Capsule(s) By Mouth     amoxicillin-clavulanate (AUGMENTIN) 875-125 MG tablet Take 1 tablet by mouth 2 (two) times daily for 7 days. 14 tablet 0   aspirin 325 MG EC tablet Take 325 mg by mouth daily.     Cholecalciferol (VITAMIN D) 50 MCG (2000 UT) CAPS Take 2,000 Units by mouth at bedtime.      famotidine (PEPCID) 20 MG tablet TAKE 1 TABLET BY MOUTH EVERY DAY AT NIGHT 90 tablet 3   fluticasone (FLOVENT HFA) 220 MCG/ACT inhaler Inhale 1 puff into the lungs 2 (two) times daily.     hydrocortisone (CORTEF) 10 MG tablet Take by mouth.     hydrocortisone (CORTEF) 20 MG tablet Take 25 mg by mouth daily.  irbesartan (AVAPRO) 75 MG tablet Take 0.5 tablets (37.5 mg total) by mouth daily. 45 tablet 3   Lancets (ONETOUCH DELICA PLUS [id number]) MISC      loratadine (CLARITIN) 10 MG tablet Take 10 mg by mouth daily.     Magnesium Oxide 400 MG CAPS Take 1 capsule (400 mg total) by mouth 5 (five) times daily. (Patient taking differently: Take 400 mg by mouth daily.)     metFORMIN (GLUCOPHAGE) 500 MG tablet Take 500 mg by mouth 2 (two) times daily.     metoprolol tartrate (LOPRESSOR) 25 MG tablet Take 1 tablet by mouth 2 (two) times daily.     montelukast (SINGULAIR) 10 MG tablet Take 10 mg by mouth daily.      Multiple Vitamin (MULTI-VITAMIN) tablet Take by mouth.     niacinamide 500 MG tablet Take 500 mg by mouth  2 (two) times daily with a meal.     omeprazole (PRILOSEC) 10 MG capsule TAKE 1 CAPSULE BY MOUTH EVERY DAY (Patient taking differently: 20 mg.) 90 capsule 1   omeprazole (PRILOSEC) 10 MG capsule Take by mouth.     ondansetron (ZOFRAN-ODT) 8 MG disintegrating tablet Take 8 mg by mouth every 8 (eight) hours as needed for nausea or vomiting.     polyethylene glycol (MIRALAX / GLYCOLAX) 17 g packet Take 1 packet by mouth daily as needed (constipation).      polyvinyl alcohol (LIQUIFILM TEARS) 1.4 % ophthalmic solution Place 1 drop into both eyes as needed for dry eyes.     rosuvastatin (CRESTOR) 40 MG tablet TAKE 1 TABLET BY MOUTH EVERY DAY 90 tablet 3   senna-docusate (SENOKOT-S) 8.6-50 MG tablet Take 2 tablets by mouth at bedtime.      sildenafil (REVATIO) 20 MG tablet Take 2-5 tablets as needed once every 48 hours for erectile dysfunction 50 tablet 3   No current facility-administered medications for this visit.     Objective:  BP 120/70   Pulse 78   Temp 98.3 F (36.8 C) (Temporal)   Ht 5\' 10"  (1.778 m)   Wt 226 lb 6 oz (102.7 kg)   SpO2 95%   BMI 32.48 kg/m  Gen: NAD, resting comfortably Some nasal turbinate edema noted with green discharge CV: RRR no murmurs rubs or gallops Lungs: CTAB no crackles, wheeze, rhonchi Ext: no edema Skin: warm, dry    Assessment and Plan   #covid 19 around July 4th- thankfully patient did well due to vaccinations, evusheld, and paxlovid- recovered within 3-4 days and overall mild. Was anxiety provoking.  -More recently over the last week started with sinus issues  (pressure, some cough, post nasal drip) on the 27th- over 10 days ago .  Feels like worsening. Coughed all day yesterday. Started sinus drainage and sore throat - now heavy green stuff.  -home covid test - negative on Wednesday this week    note patient has been tolerating 12.5 mg hydrocortisone in the morning and 5 mg in the afternoon  but had a crisis in October and back up to 15 mg and  then 5 mg in the afternoon -he is comfortable with sick day rules and even has injectable available at home   #GVHD- Still on singulair and flovent. Also had albuterol had needed.  No recent pulmonology follow-up- has visit in January planned -Patient with myelodysplastic syndrome but has done incredibly well-last follow-up with Dr. Nanda Quinton 04/25/2021- this was 5 year check up! He has done so well  #Pancreatic cyst follow up  in January planned through [facility]- MRI and then meets with GI   #hypertension S: medication: amlodipine 10 mg every day, metoprolol 25 mg twice daily,  Irbesartan 37.5 mg daily Home readings #s: similar to in office on spot checks BP Readings from Last 3 Encounters:  05/29/21 120/70  01/17/21 118/73  08/26/20 114/62  A/P:  Controlled. Continue current medications.    #hyperlipidemia/ History of CVA S: Medication:rosuvastatin 40 mg every day. No side effects from med at present- we did trial off but did not help neuropathy Lab Results  Component Value Date   CHOL 128 08/22/2020   HDL 44 08/22/2020   LDLCALC 59 08/22/2020   LDLDIRECT 77 02/17/2020   TRIG 171 (H) 08/22/2020   CHOLHDL 2.9 08/22/2020   A/P: Lipids well-controlled on last check with LDL goal 70 or less-continue current medication  # Steroid Induced Hyperglycemia- also sees endocrine Dr. Sanjuana Kava at [facility] for adrenal suppression issues with ongoing steroids S:  Medication: metformin 500 mg twice daily  Sugar- usually around 100 Exercise and diet- had been going to gym until getting sick- then tweaked knee- thinks can get back wonce feeling better from current illness Lab Results  Component Value Date   HGBA1C 6.7 (H) 02/17/2021   HGBA1C 6.0 11/14/2020   HGBA1C 6.7 (H) 08/22/2020   A/P: Has been doing very well-a1c well controlled- continue curent meds   # Neuropathy- post chemotherapy. He does get some discomfort walking - stated this is his biggest complaint- felt it in his thighs at times.  Trial off statin did not help. Even some in fingertips but slightly better. Alpha lipoic acid had been trialed -thinks maybe helped fingertips slightly  Recommended follow up: Return in about 4 months (around 09/27/2021) for follow up- or sooner if needed.  Lab/Order associations:   ICD-10-CM   1. Essential hypertension  I10     2. Hyperlipidemia, unspecified hyperlipidemia type  E78.5     3. Steroid-induced hyperglycemia  R73.9    T38.0X5A     4. Adrenal insufficiency (HCC)  E27.40     5. Bacterial sinusitis  J32.9    B96.89      Meds ordered this encounter  Medications   amoxicillin-clavulanate (AUGMENTIN) 875-125 MG tablet    Sig: Take 1 tablet by mouth 2 (two) times daily for 7 days.    Dispense:  14 tablet    Refill:  0   I,Jada Bradford,acting as a scribe for Garret Reddish, MD.,have documented all relevant documentation on the behalf of Garret Reddish, MD,as directed by  Garret Reddish, MD while in the presence of Garret Reddish, MD.  I, Garret Reddish, MD, have reviewed all documentation for this visit. The documentation on 05/29/21 for the exam, diagnosis, procedures, and orders are all accurate and complete.   Return precautions advised.  Garret Reddish, MD

## 2021-05-24 DIAGNOSIS — T380X5A Adverse effect of glucocorticoids and synthetic analogues, initial encounter: Secondary | ICD-10-CM | POA: Diagnosis not present

## 2021-05-24 DIAGNOSIS — T380X5D Adverse effect of glucocorticoids and synthetic analogues, subsequent encounter: Secondary | ICD-10-CM | POA: Diagnosis not present

## 2021-05-24 DIAGNOSIS — E2740 Unspecified adrenocortical insufficiency: Secondary | ICD-10-CM | POA: Diagnosis not present

## 2021-05-24 DIAGNOSIS — R739 Hyperglycemia, unspecified: Secondary | ICD-10-CM | POA: Diagnosis not present

## 2021-05-29 ENCOUNTER — Encounter: Payer: Self-pay | Admitting: Family Medicine

## 2021-05-29 ENCOUNTER — Other Ambulatory Visit: Payer: Self-pay

## 2021-05-29 ENCOUNTER — Ambulatory Visit (INDEPENDENT_AMBULATORY_CARE_PROVIDER_SITE_OTHER): Payer: Medicare Other | Admitting: Family Medicine

## 2021-05-29 VITALS — BP 120/70 | HR 78 | Temp 98.3°F | Ht 70.0 in | Wt 226.4 lb

## 2021-05-29 DIAGNOSIS — J329 Chronic sinusitis, unspecified: Secondary | ICD-10-CM

## 2021-05-29 DIAGNOSIS — R739 Hyperglycemia, unspecified: Secondary | ICD-10-CM | POA: Diagnosis not present

## 2021-05-29 DIAGNOSIS — I10 Essential (primary) hypertension: Secondary | ICD-10-CM | POA: Diagnosis not present

## 2021-05-29 DIAGNOSIS — E785 Hyperlipidemia, unspecified: Secondary | ICD-10-CM | POA: Diagnosis not present

## 2021-05-29 DIAGNOSIS — B9689 Other specified bacterial agents as the cause of diseases classified elsewhere: Secondary | ICD-10-CM

## 2021-05-29 DIAGNOSIS — T380X5A Adverse effect of glucocorticoids and synthetic analogues, initial encounter: Secondary | ICD-10-CM

## 2021-05-29 DIAGNOSIS — E2740 Unspecified adrenocortical insufficiency: Secondary | ICD-10-CM

## 2021-05-29 MED ORDER — AMOXICILLIN-POT CLAVULANATE 875-125 MG PO TABS: 1.0000 | ORAL_TABLET | Freq: Two times a day (BID) | ORAL | 0 refills | Status: AC

## 2021-05-29 NOTE — Patient Instructions (Addendum)
Thrilled you are doing so well outside current illness. Lets try augmentin with symptoms over 10 days and no improvement and green sputum- potential bacterial sinusitis. Let me know if not improving or worsens or not fully cleared by end of course.   Recommended follow up: Return in about 4 months (around 09/27/2021) for follow up- or sooner if needed.

## 2021-06-22 DIAGNOSIS — L821 Other seborrheic keratosis: Secondary | ICD-10-CM | POA: Diagnosis not present

## 2021-06-22 DIAGNOSIS — Z85828 Personal history of other malignant neoplasm of skin: Secondary | ICD-10-CM | POA: Diagnosis not present

## 2021-06-22 DIAGNOSIS — D485 Neoplasm of uncertain behavior of skin: Secondary | ICD-10-CM | POA: Diagnosis not present

## 2021-06-22 DIAGNOSIS — L820 Inflamed seborrheic keratosis: Secondary | ICD-10-CM | POA: Diagnosis not present

## 2021-06-22 DIAGNOSIS — L308 Other specified dermatitis: Secondary | ICD-10-CM | POA: Diagnosis not present

## 2021-06-22 DIAGNOSIS — L570 Actinic keratosis: Secondary | ICD-10-CM | POA: Diagnosis not present

## 2021-06-22 DIAGNOSIS — L720 Epidermal cyst: Secondary | ICD-10-CM | POA: Diagnosis not present

## 2021-07-05 DIAGNOSIS — K5900 Constipation, unspecified: Secondary | ICD-10-CM | POA: Diagnosis not present

## 2021-07-05 DIAGNOSIS — K862 Cyst of pancreas: Secondary | ICD-10-CM | POA: Diagnosis not present

## 2021-07-08 DIAGNOSIS — Z20822 Contact with and (suspected) exposure to covid-19: Secondary | ICD-10-CM | POA: Diagnosis not present

## 2021-07-14 DIAGNOSIS — Z9484 Stem cells transplant status: Secondary | ICD-10-CM | POA: Diagnosis not present

## 2021-07-14 DIAGNOSIS — E2740 Unspecified adrenocortical insufficiency: Secondary | ICD-10-CM | POA: Diagnosis not present

## 2021-07-14 DIAGNOSIS — Z79899 Other long term (current) drug therapy: Secondary | ICD-10-CM | POA: Diagnosis not present

## 2021-07-14 DIAGNOSIS — D89811 Chronic graft-versus-host disease: Secondary | ICD-10-CM | POA: Diagnosis not present

## 2021-07-14 DIAGNOSIS — Z8673 Personal history of transient ischemic attack (TIA), and cerebral infarction without residual deficits: Secondary | ICD-10-CM | POA: Diagnosis not present

## 2021-07-14 DIAGNOSIS — Z86718 Personal history of other venous thrombosis and embolism: Secondary | ICD-10-CM | POA: Diagnosis not present

## 2021-07-14 DIAGNOSIS — I10 Essential (primary) hypertension: Secondary | ICD-10-CM | POA: Diagnosis not present

## 2021-07-14 DIAGNOSIS — Z9481 Bone marrow transplant status: Secondary | ICD-10-CM | POA: Diagnosis not present

## 2021-07-14 DIAGNOSIS — E782 Mixed hyperlipidemia: Secondary | ICD-10-CM | POA: Diagnosis not present

## 2021-07-14 DIAGNOSIS — T8609 Other complications of bone marrow transplant: Secondary | ICD-10-CM | POA: Diagnosis not present

## 2021-07-20 ENCOUNTER — Encounter: Payer: Self-pay | Admitting: Family Medicine

## 2021-07-20 ENCOUNTER — Other Ambulatory Visit: Payer: Self-pay

## 2021-07-20 ENCOUNTER — Ambulatory Visit (INDEPENDENT_AMBULATORY_CARE_PROVIDER_SITE_OTHER): Payer: Medicare Other | Admitting: Family Medicine

## 2021-07-20 VITALS — BP 108/68 | HR 65 | Temp 97.8°F

## 2021-07-20 DIAGNOSIS — E2740 Unspecified adrenocortical insufficiency: Secondary | ICD-10-CM

## 2021-07-20 DIAGNOSIS — R509 Fever, unspecified: Secondary | ICD-10-CM | POA: Diagnosis not present

## 2021-07-20 DIAGNOSIS — I10 Essential (primary) hypertension: Secondary | ICD-10-CM | POA: Diagnosis not present

## 2021-07-20 LAB — POCT INFLUENZA A/B
Influenza A, POC: NEGATIVE
Influenza B, POC: NEGATIVE

## 2021-07-20 LAB — COMPREHENSIVE METABOLIC PANEL
ALT: 18 U/L (ref 0–53)
AST: 17 U/L (ref 0–37)
Albumin: 3.9 g/dL (ref 3.5–5.2)
Alkaline Phosphatase: 60 U/L (ref 39–117)
BUN: 18 mg/dL (ref 6–23)
CO2: 28 mEq/L (ref 19–32)
Calcium: 9.4 mg/dL (ref 8.4–10.5)
Chloride: 102 mEq/L (ref 96–112)
Creatinine, Ser: 1.29 mg/dL (ref 0.40–1.50)
GFR: 55.63 mL/min — ABNORMAL LOW (ref >60.00)
Glucose, Bld: 131 mg/dL — ABNORMAL HIGH (ref 70–99)
Potassium: 4.1 mEq/L (ref 3.5–5.1)
Sodium: 138 mEq/L (ref 135–145)
Total Bilirubin: 0.6 mg/dL (ref 0.2–1.2)
Total Protein: 6.9 g/dL (ref 6.0–8.3)

## 2021-07-20 LAB — CBC WITH DIFFERENTIAL/PLATELET
Basophils Absolute: 0 10*3/uL (ref 0.0–0.1)
Basophils Relative: 0.1 % (ref 0.0–3.0)
Eosinophils Absolute: 0.1 10*3/uL (ref 0.0–0.7)
Eosinophils Relative: 0.5 % (ref 0.0–5.0)
HCT: 39.4 % (ref 39.0–52.0)
Hemoglobin: 12.9 g/dL — ABNORMAL LOW (ref 13.0–17.0)
Lymphocytes Relative: 25.5 % (ref 12.0–46.0)
Lymphs Abs: 2.6 10*3/uL (ref 0.7–4.0)
MCHC: 32.6 g/dL (ref 30.0–36.0)
MCV: 91.2 fl (ref 78.0–100.0)
Monocytes Absolute: 1.1 10*3/uL — ABNORMAL HIGH (ref 0.1–1.0)
Monocytes Relative: 10.6 % (ref 3.0–12.0)
Neutro Abs: 6.4 10*3/uL (ref 1.4–7.7)
Neutrophils Relative %: 63.3 % (ref 43.0–77.0)
Platelets: 347 10*3/uL (ref 150.0–400.0)
RBC: 4.32 Mil/uL (ref 4.22–5.81)
RDW: 16.6 % — ABNORMAL HIGH (ref 11.5–15.5)
WBC: 10.1 10*3/uL (ref 4.0–10.5)

## 2021-07-20 LAB — POC COVID19 BINAXNOW: SARS Coronavirus 2 Ag: NEGATIVE

## 2021-07-20 NOTE — Telephone Encounter (Signed)
Patient called and wanted to know if the labs in his my chart message could be draw here. If not he would drive down to [facility].

## 2021-07-20 NOTE — Telephone Encounter (Signed)
We worked patient in today for a visit

## 2021-07-20 NOTE — Patient Instructions (Addendum)
Lab draw in room- cbc, cmp, blood culture, urine culture, flu swab, covid swab. We did not have easy access to viral panel including rsv  Give urine in bathroom but do not remove mask  Glad you are feeling better  Recommended follow up: Return for as needed for new, worsening, persistent symptoms.

## 2021-07-20 NOTE — Progress Notes (Signed)
Phone [phone] In person visit   Subjective:   Brandon [name] is a 71 y.o. year old very pleasant male patient who presents for/with See problem oriented charting Chief Complaint  Patient presents with   Covid Testing    This visit occurred during the SARS-CoV-2 public health emergency.  Safety protocols were in place, including screening questions prior to the visit, additional usage of staff PPE, and extensive cleaning of exam room while observing appropriate contact time as indicated for disinfecting solutions.   Past Medical History-  Patient Active Problem List   Diagnosis Date Noted   Allergic rhinitis 10/03/2019    Priority: High   Pancreatic lesion 10/03/2019    Priority: High   History of CVA (cerebrovascular accident) 03/10/2019    Priority: High   Adrenal insufficiency ([place]) 08/06/2017    Priority: High   GVHD (graft versus host disease) ([place]) 03/04/2017    Priority: High   History of myelodysplastic syndrome 12/06/2015    Priority: High   Steroid-induced hyperglycemia 08/16/2014    Priority: High   Fatty liver 01/05/2020    Priority: Medium    Osteopenia 10/05/2019    Priority: Medium    History of DVT (deep vein thrombosis) 09/25/2018    Priority: Medium    Post chemotherapy Dry eyes due to decreased tear production 08/06/2017    Priority: Medium    post chemotherapy Peripheral neuropathy 08/06/2017    Priority: Medium    BPH associated with nocturia 11/15/2014    Priority: Medium    Hyperlipidemia 02/04/2007    Priority: Medium    Essential hypertension 02/04/2007    Priority: Medium    History of stem cell transplant ([place]) 03/04/2017    Priority: Low   Leukopenia 11/15/2014    Priority: Low   Chest pain 08/13/2014    Priority: Low   GERD (gastroesophageal reflux disease) 05/03/2014    Priority: Low   Cervical disc disorder with radiculopathy of cervical region 07/14/2010    Priority: [place] LOSS BILATERAL 07/14/2010     Priority: Low   ERECTILE DYSFUNCTION 02/07/2007    Priority: Low    Medications- reviewed and updated Current Outpatient Medications  Medication Sig Dispense Refill   acyclovir (ZOVIRAX) 400 MG tablet Take 400 mg by mouth 2 (two) times daily.     albuterol (VENTOLIN HFA) 108 (90 Base) MCG/ACT inhaler Inhale into the lungs in the morning and at bedtime.     Alpha-Lipoic Acid 600 MG CAPS Take by mouth.     amLODipine (NORVASC) 10 MG tablet TAKE 1 TABLET BY MOUTH EVERY DAY 90 tablet 3   amoxicillin (AMOXIL) 500 MG capsule SMARTSIG:4 Capsule(s) By Mouth     aspirin 325 MG EC tablet Take 325 mg by mouth daily.     Cholecalciferol (VITAMIN D) 50 MCG (2000 UT) CAPS Take 2,000 Units by mouth at bedtime.      famotidine (PEPCID) 20 MG tablet TAKE 1 TABLET BY MOUTH EVERY DAY AT NIGHT 90 tablet 3   fluticasone (FLOVENT HFA) 220 MCG/ACT inhaler Inhale 1 puff into the lungs 2 (two) times daily.     hydrocortisone (CORTEF) 10 MG tablet Take by mouth.     hydrocortisone (CORTEF) 20 MG tablet Take 25 mg by mouth daily.     irbesartan (AVAPRO) 75 MG tablet Take 0.5 tablets (37.5 mg total) by mouth daily. 45 tablet 3   Lancets (ONETOUCH DELICA PLUS [id number]) MISC      loratadine (CLARITIN) 10 MG  tablet Take 10 mg by mouth daily.     Magnesium Oxide 400 MG CAPS Take 1 capsule (400 mg total) by mouth 5 (five) times daily. (Patient taking differently: Take 400 mg by mouth daily.)     metFORMIN (GLUCOPHAGE) 500 MG tablet Take 500 mg by mouth 2 (two) times daily.     metoprolol tartrate (LOPRESSOR) 25 MG tablet Take 1 tablet by mouth 2 (two) times daily.     montelukast (SINGULAIR) 10 MG tablet Take 10 mg by mouth daily.      Multiple Vitamin (MULTI-VITAMIN) tablet Take by mouth.     niacinamide 500 MG tablet Take 500 mg by mouth 2 (two) times daily with a meal.     omeprazole (PRILOSEC) 10 MG capsule TAKE 1 CAPSULE BY MOUTH EVERY DAY (Patient taking differently: 20 mg.) 90 capsule 1   omeprazole (PRILOSEC)  10 MG capsule Take by mouth.     ondansetron (ZOFRAN-ODT) 8 MG disintegrating tablet Take 8 mg by mouth every 8 (eight) hours as needed for nausea or vomiting.     polyethylene glycol (MIRALAX / GLYCOLAX) 17 g packet Take 1 packet by mouth daily as needed (constipation).      polyvinyl alcohol (LIQUIFILM TEARS) 1.4 % ophthalmic solution Place 1 drop into both eyes as needed for dry eyes.     rosuvastatin (CRESTOR) 40 MG tablet TAKE 1 TABLET BY MOUTH EVERY DAY 90 tablet 3   senna-docusate (SENOKOT-S) 8.6-50 MG tablet Take 2 tablets by mouth at bedtime.      sildenafil (REVATIO) 20 MG tablet Take 2-5 tablets as needed once every 48 hours for erectile dysfunction 50 tablet 3   No current facility-administered medications for this visit.     Objective:  BP 108/68 (BP Location: Left Arm, Patient Position: Sitting)    Pulse 65    Temp 97.8 F (36.6 C)    SpO2 96%  Gen: NAD, resting comfortably Oropharynx normal, nasal turbinates normal CV: RRR no murmurs rubs or gallops Lungs: CTAB no crackles, wheeze, rhonchi Abdomen: soft/nontender/nondistended/normal bowel sounds. No rebound or guarding.  Ext: trace edema Skin: warm, dry Neuro: grossly normal, moves all extremities    Assessment and Plan   #Fever/nausea/vomiting and patient with adrenal insufficiency S: Patient had nausea and vomiting on around 1:30 AM for about an hour and a half yesterday morning.  He took Zofran and the extra 20 mg of hydrocortisone.  Felt some chills but no fever.  Was able to fall asleep around 5 AM and wake up feeling slightly better but had fever up to 100.7.  Take another 20 mg of hydrocortisone and Tylenol.  Has been trying to drink a lot of water.  He states only possible trigger-drink slightly less water and was exposed to a very strong perfume/lotion but did not bother him immediately -most recent episode similar in October- first time he didn't go to hospital.   Has been on his regular course of hydrocortisone  15 in the morning and 5 in the evening prior to this.   Had communicated with Dr. Corena Pilgrim who recommended blood culture, urine culture, basic labs  Slept most of yesterday and was tired. Has not had more chills or fever. No more nausea or vomiting. No shortness of breath. No cough with this. Yesterday did 20 mg twice daily and today taking normal 15 mg hydrocortisone and plans on 5 this evening.  A/P: Fever/nausea/vomiting yesterday morning and patient with adrenal insufficiency.  Has communicated with [facility] and they have  recommended work-up including CBC, CMP, blood culture, urine culture, respiratory viral panel.  We did not have easy access to respiratory viral panel but we did flu and COVID swab which were negative. -With illness and adrenal suppression patient follow the appropriate sick rules and took 20 mg of hydrocortisone twice a day and feels much better overall-we will see if we can identify a trigger  #hypertension S: medication: Amlodipine 10 mg, irbesartan half tablet as well as metoprolol 25 mg twice daily BP Readings from Last 3 Encounters:  07/20/21 108/68  05/29/21 120/70  01/17/21 118/73  A/P: Well-controlled and does not appear overcontrolled despite taking all 3 medications-discussed could hold irbesartan in particular if nausea/vomiting   Recommended follow up: Return for as needed for new, worsening, persistent symptoms. Future Appointments  Date Time Provider [place]  10/11/2021  9:20 AM [name], MD [facility]    Lab/Order associations:   ICD-10-CM   1. Fever, unspecified fever cause  R50.9 CBC with Differential/Platelet    Comprehensive metabolic panel    Urine Culture    Blood culture (routine single)    2. Adrenal insufficiency (HCC)  E27.40     3. Essential hypertension  I10       Return precautions advised.  Garret Reddish, MD

## 2021-07-21 ENCOUNTER — Telehealth: Payer: Self-pay | Admitting: Family Medicine

## 2021-07-21 NOTE — Chronic Care Management (AMB) (Signed)
°  Chronic Care Management   Outreach Note  07/21/2021 Name: Brandon [name] MRN: [id number] DOB: 1951/01/28  Referred by: [name], MD Reason for referral : No chief complaint on file.   An unsuccessful telephone outreach was attempted today. The patient was referred to the pharmacist for assistance with care management and care coordination.   Follow Up Plan:   Tatjana Dellinger Upstream Scheduler

## 2021-07-26 LAB — CULTURE, BLOOD (SINGLE): MICRO NUMBER:: 12923931

## 2021-07-26 LAB — URINE CULTURE
MICRO NUMBER:: 12923934
Result:: NO GROWTH
SPECIMEN QUALITY:: ADEQUATE

## 2021-08-08 ENCOUNTER — Other Ambulatory Visit: Payer: Self-pay | Admitting: Family Medicine

## 2021-08-30 ENCOUNTER — Telehealth: Payer: Self-pay | Admitting: Family Medicine

## 2021-08-30 DIAGNOSIS — I82441 Acute embolism and thrombosis of right tibial vein: Secondary | ICD-10-CM | POA: Diagnosis not present

## 2021-08-30 DIAGNOSIS — T8609 Other complications of bone marrow transplant: Secondary | ICD-10-CM | POA: Diagnosis not present

## 2021-08-30 DIAGNOSIS — Z09 Encounter for follow-up examination after completed treatment for conditions other than malignant neoplasm: Secondary | ICD-10-CM | POA: Diagnosis not present

## 2021-08-30 DIAGNOSIS — Z7901 Long term (current) use of anticoagulants: Secondary | ICD-10-CM | POA: Diagnosis not present

## 2021-08-30 DIAGNOSIS — I10 Essential (primary) hypertension: Secondary | ICD-10-CM | POA: Diagnosis not present

## 2021-08-30 DIAGNOSIS — E1169 Type 2 diabetes mellitus with other specified complication: Secondary | ICD-10-CM | POA: Diagnosis not present

## 2021-08-30 DIAGNOSIS — R5383 Other fatigue: Secondary | ICD-10-CM | POA: Diagnosis not present

## 2021-08-30 DIAGNOSIS — Z79899 Other long term (current) drug therapy: Secondary | ICD-10-CM | POA: Diagnosis not present

## 2021-08-30 DIAGNOSIS — R739 Hyperglycemia, unspecified: Secondary | ICD-10-CM | POA: Diagnosis not present

## 2021-08-30 DIAGNOSIS — D89811 Chronic graft-versus-host disease: Secondary | ICD-10-CM | POA: Diagnosis not present

## 2021-08-30 DIAGNOSIS — E7800 Pure hypercholesterolemia, unspecified: Secondary | ICD-10-CM | POA: Diagnosis not present

## 2021-08-30 DIAGNOSIS — E1165 Type 2 diabetes mellitus with hyperglycemia: Secondary | ICD-10-CM | POA: Diagnosis not present

## 2021-08-30 DIAGNOSIS — R600 Localized edema: Secondary | ICD-10-CM | POA: Diagnosis not present

## 2021-08-30 DIAGNOSIS — G4733 Obstructive sleep apnea (adult) (pediatric): Secondary | ICD-10-CM | POA: Diagnosis not present

## 2021-08-30 DIAGNOSIS — T50905A Adverse effect of unspecified drugs, medicaments and biological substances, initial encounter: Secondary | ICD-10-CM | POA: Diagnosis not present

## 2021-08-30 DIAGNOSIS — G629 Polyneuropathy, unspecified: Secondary | ICD-10-CM | POA: Diagnosis not present

## 2021-08-30 DIAGNOSIS — E2740 Unspecified adrenocortical insufficiency: Secondary | ICD-10-CM | POA: Diagnosis not present

## 2021-08-30 DIAGNOSIS — C9200 Acute myeloblastic leukemia, not having achieved remission: Secondary | ICD-10-CM | POA: Diagnosis not present

## 2021-08-30 DIAGNOSIS — R002 Palpitations: Secondary | ICD-10-CM | POA: Diagnosis not present

## 2021-08-30 DIAGNOSIS — K219 Gastro-esophageal reflux disease without esophagitis: Secondary | ICD-10-CM | POA: Diagnosis not present

## 2021-08-30 DIAGNOSIS — D469 Myelodysplastic syndrome, unspecified: Secondary | ICD-10-CM | POA: Diagnosis not present

## 2021-08-30 DIAGNOSIS — Z79621 Long term (current) use of calcineurin inhibitor: Secondary | ICD-10-CM | POA: Diagnosis not present

## 2021-08-30 DIAGNOSIS — Z9481 Bone marrow transplant status: Secondary | ICD-10-CM | POA: Diagnosis not present

## 2021-08-30 DIAGNOSIS — E785 Hyperlipidemia, unspecified: Secondary | ICD-10-CM | POA: Diagnosis not present

## 2021-08-30 DIAGNOSIS — R5381 Other malaise: Secondary | ICD-10-CM | POA: Diagnosis not present

## 2021-08-30 DIAGNOSIS — Z86718 Personal history of other venous thrombosis and embolism: Secondary | ICD-10-CM | POA: Diagnosis not present

## 2021-08-30 DIAGNOSIS — K2100 Gastro-esophageal reflux disease with esophagitis, without bleeding: Secondary | ICD-10-CM | POA: Diagnosis not present

## 2021-08-30 DIAGNOSIS — M1990 Unspecified osteoarthritis, unspecified site: Secondary | ICD-10-CM | POA: Diagnosis not present

## 2021-08-30 LAB — LIPID PANEL
Cholesterol: 132 (ref 0–200)
HDL: 50 (ref 35–70)
LDL Cholesterol: 48
Triglycerides: 172 — AB (ref 40–160)

## 2021-08-30 NOTE — Telephone Encounter (Signed)
Copied from [place] [id number]. Topic: Medicare AWV ?>> Aug 30, 2021 11:38 AM Harris-Coley, Hannah Beat wrote: ?Reason for CRM: Left message for patient to schedule Annual Wellness Visit.  Please schedule with Nurse Health Advisor Charlott Rakes, RN at [facility]  Please call [phone] ask for Juliann Pulse ?

## 2021-09-01 NOTE — Progress Notes (Incomplete)
Phone [phone] In person visit   Subjective:   Brandon [name] is a 71 y.o. year old very pleasant male patient who presents for/with See problem oriented charting No chief complaint on file.   This visit occurred during the SARS-CoV-2 public health emergency.  Safety protocols were in place, including screening questions prior to the visit, additional usage of staff PPE, and extensive cleaning of exam room while observing appropriate contact time as indicated for disinfecting solutions.   Past Medical History-  Patient Active Problem List   Diagnosis Date Noted   Fatty liver 01/05/2020   Osteopenia 10/05/2019   Allergic rhinitis 10/03/2019   Pancreatic lesion 10/03/2019   History of CVA (cerebrovascular accident) 03/10/2019   History of DVT (deep vein thrombosis) 09/25/2018   Adrenal insufficiency ([place]) 08/06/2017   Post chemotherapy Dry eyes due to decreased tear production 08/06/2017   post chemotherapy Peripheral neuropathy 08/06/2017   History of stem cell transplant ([place]) 03/04/2017   GVHD (graft versus host disease) ([place]) 03/04/2017   History of myelodysplastic syndrome 12/06/2015   BPH associated with nocturia 11/15/2014   Leukopenia 11/15/2014   Steroid-induced hyperglycemia 08/16/2014   Chest pain 08/13/2014   GERD (gastroesophageal reflux disease) 05/03/2014   Cervical disc disorder with radiculopathy of cervical region 07/14/2010   MIXED HEARING LOSS BILATERAL 07/14/2010   ERECTILE DYSFUNCTION 02/07/2007   Hyperlipidemia 02/04/2007   Essential hypertension 02/04/2007    Medications- reviewed and updated Current Outpatient Medications  Medication Sig Dispense Refill   acyclovir (ZOVIRAX) 400 MG tablet Take 400 mg by mouth 2 (two) times daily.     albuterol (VENTOLIN HFA) 108 (90 Base) MCG/ACT inhaler Inhale into the lungs in the morning and at bedtime.     Alpha-Lipoic Acid 600 MG CAPS Take by mouth.     amLODipine (NORVASC) 10 MG tablet TAKE 1 TABLET BY  MOUTH EVERY DAY 90 tablet 3   amoxicillin (AMOXIL) 500 MG capsule SMARTSIG:4 Capsule(s) By Mouth     aspirin 325 MG EC tablet Take 325 mg by mouth daily.     Cholecalciferol (VITAMIN D) 50 MCG (2000 UT) CAPS Take 2,000 Units by mouth at bedtime.      famotidine (PEPCID) 20 MG tablet TAKE 1 TABLET BY MOUTH EVERY DAY AT NIGHT 90 tablet 3   fluticasone (FLOVENT HFA) 220 MCG/ACT inhaler Inhale 1 puff into the lungs 2 (two) times daily.     hydrocortisone (CORTEF) 10 MG tablet Take by mouth.     hydrocortisone (CORTEF) 20 MG tablet Take 25 mg by mouth daily.     irbesartan (AVAPRO) 75 MG tablet Take 0.5 tablets (37.5 mg total) by mouth daily. 45 tablet 3   Lancets (ONETOUCH DELICA PLUS [id number]) MISC      loratadine (CLARITIN) 10 MG tablet Take 10 mg by mouth daily.     Magnesium Oxide 400 MG CAPS Take 1 capsule (400 mg total) by mouth 5 (five) times daily. (Patient taking differently: Take 400 mg by mouth daily.)     metFORMIN (GLUCOPHAGE) 500 MG tablet Take 500 mg by mouth 2 (two) times daily.     metoprolol tartrate (LOPRESSOR) 25 MG tablet Take 1 tablet by mouth 2 (two) times daily.     montelukast (SINGULAIR) 10 MG tablet Take 10 mg by mouth daily.      Multiple Vitamin (MULTI-VITAMIN) tablet Take by mouth.     niacinamide 500 MG tablet Take 500 mg by mouth 2 (two) times daily with a meal.  omeprazole (PRILOSEC) 10 MG capsule TAKE 1 CAPSULE BY MOUTH EVERY DAY (Patient taking differently: 20 mg.) 90 capsule 1   omeprazole (PRILOSEC) 10 MG capsule Take by mouth.     ondansetron (ZOFRAN-ODT) 8 MG disintegrating tablet Take 8 mg by mouth every 8 (eight) hours as needed for nausea or vomiting.     polyethylene glycol (MIRALAX / GLYCOLAX) 17 g packet Take 1 packet by mouth daily as needed (constipation).      polyvinyl alcohol (LIQUIFILM TEARS) 1.4 % ophthalmic solution Place 1 drop into both eyes as needed for dry eyes.     rosuvastatin (CRESTOR) 40 MG tablet TAKE 1 TABLET BY MOUTH EVERY DAY 90  tablet 3   senna-docusate (SENOKOT-S) 8.6-50 MG tablet Take 2 tablets by mouth at bedtime.      sildenafil (REVATIO) 20 MG tablet Take 2-5 tablets as needed once every 48 hours for erectile dysfunction 50 tablet 3   No current facility-administered medications for this visit.     Objective:  There were no vitals taken for this visit. Gen: NAD, resting comfortably CV: RRR no murmurs rubs or gallops Lungs: CTAB no crackles, wheeze, rhonchi Abdomen: soft/nontender/nondistended/normal bowel sounds. No rebound or guarding.  Ext: no edema Skin: warm, dry Neuro: grossly normal, moves all extremities  ***    Assessment and Plan   ***forward to [place] and will see on mychart ***Apr 17 2021 will be 5 years  From our end-we would like to try to reduce omeprazole to 10 mg at follow-up if reflux continues to be well controlled to see if that can help with absorption. Appears he is already on Pepcid  ***follow up MRI of the abd- pancreas focus- now followed at [facility]  ***mild osteopenia- 2000 vitamin D. recheck 10/01/21 bone density  ***look out for care plan post transplant  ***  You had a tentanus shot 08/27/2018 as part of the DTaP injection, so you are good for another 3-5 years. We advise getting a repeat tetanus about every 5-7 years. General public gets them every 10 years.   I'll add your pneumovax to your list! Thank you for the info.   ECHO and PFTs are being routinely ordered as part of long-term screening. Nothing wrong is suspected, just a routine ECHO. We can cancel this one and reschedule another time? It seems that would be best since getting the other MD appointments is more difficult. Let me know what you think. from [place] 08/31/20  #Fever/nausea/vomiting and patient with adrenal insufficiency S: Patient had nausea and vomiting on around 1:30 AM for about an hour and a half yesterday morning.  He took Zofran and the extra 20 mg of hydrocortisone.  Felt some chills but no  fever.  Was able to fall asleep around 5 AM and wake up feeling slightly better but had fever up to 100.7.  Take another 20 mg of hydrocortisone and Tylenol.  Has been trying to drink a lot of water.  He states only possible trigger-drink slightly less water and was exposed to a very strong perfume/lotion but did not bother him immediately -most recent episode similar in October- first time he didn't go to hospital.    Has been on his regular course of hydrocortisone 15 in the morning and 5 in the evening prior to this.    Had communicated with Dr. Corena Pilgrim who recommended blood culture, urine culture, basic labs   Slept most of yesterday and was tired. Has not had more chills or fever. No more  nausea or vomiting. No shortness of breath. No cough with this. Yesterday did 20 mg twice daily and today taking normal 15 mg hydrocortisone and plans on 5 this evening.  -Has communicated with [facility] and they have recommended work-up including CBC, CMP, blood culture, urine culture, respiratory viral panel.  We did not have easy access to respiratory viral panel but we did flu and COVID swab which were negative. --With illness and adrenal suppression patient follow the appropriate rules and took 20 mg of hydrocortisone twice a day and felt much better overall-we will see if we can identify a trigger A/P: ***   #hypertension S: medication: Amlodipine 10 mg, irbesartan half tablet as well as metoprolol 25 mg twice daily Home readings #s: *** BP Readings from Last 3 Encounters:  07/20/21 108/68  05/29/21 120/70  01/17/21 118/73  A/P: ***  #hyperlipidemia S: Medication:rosuvastatin 40 mg   Lab Results  Component Value Date   CHOL 128 08/22/2020   HDL 44 08/22/2020   LDLCALC 59 08/22/2020   LDLDIRECT 77 02/17/2020   TRIG 171 (H) 08/22/2020   CHOLHDL 2.9 08/22/2020   A/P: ***    Recommended follow up: No follow-ups on file. Future Appointments  Date Time Provider [place]   10/11/2021  9:20 AM [name], MD [facility]    Lab/Order associations: No diagnosis found.  No orders of the defined types were placed in this encounter.   Return precautions advised.  Burnett Corrente

## 2021-09-14 ENCOUNTER — Ambulatory Visit: Payer: Medicare Other

## 2021-10-11 ENCOUNTER — Encounter: Payer: Self-pay | Admitting: Family Medicine

## 2021-10-11 ENCOUNTER — Ambulatory Visit (INDEPENDENT_AMBULATORY_CARE_PROVIDER_SITE_OTHER): Payer: Medicare Other | Admitting: Family Medicine

## 2021-10-11 VITALS — BP 110/68 | HR 64 | Temp 98.6°F | Ht 70.0 in | Wt 232.4 lb

## 2021-10-11 DIAGNOSIS — D89813 Graft-versus-host disease, unspecified: Secondary | ICD-10-CM

## 2021-10-11 DIAGNOSIS — I10 Essential (primary) hypertension: Secondary | ICD-10-CM | POA: Diagnosis not present

## 2021-10-11 DIAGNOSIS — Z9484 Stem cells transplant status: Secondary | ICD-10-CM | POA: Diagnosis not present

## 2021-10-11 DIAGNOSIS — E785 Hyperlipidemia, unspecified: Secondary | ICD-10-CM | POA: Diagnosis not present

## 2021-10-11 DIAGNOSIS — R739 Hyperglycemia, unspecified: Secondary | ICD-10-CM | POA: Diagnosis not present

## 2021-10-11 DIAGNOSIS — E2740 Unspecified adrenocortical insufficiency: Secondary | ICD-10-CM | POA: Diagnosis not present

## 2021-10-11 DIAGNOSIS — T380X5A Adverse effect of glucocorticoids and synthetic analogues, initial encounter: Secondary | ICD-10-CM | POA: Diagnosis not present

## 2021-10-11 LAB — POCT GLYCOSYLATED HEMOGLOBIN (HGB A1C): Hemoglobin A1C: 6.2 % — AB (ref 4.0–5.6)

## 2021-10-11 NOTE — Patient Instructions (Addendum)
Let us know when you go to [facility] to have your second Shingrix. ? ?POC a1c today and can let Dr. [name] ? ?Recommended follow up: Return in about 6 months (around 04/12/2022) for followup or sooner if needed.Schedule b4 you leave.  ? ? ?

## 2021-10-13 DIAGNOSIS — D89813 Graft-versus-host disease, unspecified: Secondary | ICD-10-CM | POA: Diagnosis not present

## 2021-10-13 DIAGNOSIS — D469 Myelodysplastic syndrome, unspecified: Secondary | ICD-10-CM | POA: Diagnosis not present

## 2021-10-13 DIAGNOSIS — Z9481 Bone marrow transplant status: Secondary | ICD-10-CM | POA: Diagnosis not present

## 2021-10-13 DIAGNOSIS — R942 Abnormal results of pulmonary function studies: Secondary | ICD-10-CM | POA: Diagnosis not present

## 2021-10-13 DIAGNOSIS — D89811 Chronic graft-versus-host disease: Secondary | ICD-10-CM | POA: Diagnosis not present

## 2021-10-13 DIAGNOSIS — T8609 Other complications of bone marrow transplant: Secondary | ICD-10-CM | POA: Diagnosis not present

## 2021-10-13 LAB — PULMONARY FUNCTION TEST

## 2021-10-18 DIAGNOSIS — E2740 Unspecified adrenocortical insufficiency: Secondary | ICD-10-CM | POA: Diagnosis not present

## 2021-10-18 DIAGNOSIS — Z79899 Other long term (current) drug therapy: Secondary | ICD-10-CM | POA: Diagnosis not present

## 2021-10-18 DIAGNOSIS — T380X5A Adverse effect of glucocorticoids and synthetic analogues, initial encounter: Secondary | ICD-10-CM | POA: Diagnosis not present

## 2021-10-18 DIAGNOSIS — Z87898 Personal history of other specified conditions: Secondary | ICD-10-CM | POA: Diagnosis not present

## 2021-10-18 DIAGNOSIS — T8609 Other complications of bone marrow transplant: Secondary | ICD-10-CM | POA: Diagnosis not present

## 2021-10-18 DIAGNOSIS — R739 Hyperglycemia, unspecified: Secondary | ICD-10-CM | POA: Diagnosis not present

## 2021-10-18 DIAGNOSIS — G4730 Sleep apnea, unspecified: Secondary | ICD-10-CM | POA: Diagnosis not present

## 2021-10-18 DIAGNOSIS — H04129 Dry eye syndrome of unspecified lacrimal gland: Secondary | ICD-10-CM | POA: Diagnosis not present

## 2021-10-18 DIAGNOSIS — G629 Polyneuropathy, unspecified: Secondary | ICD-10-CM | POA: Diagnosis not present

## 2021-10-18 DIAGNOSIS — Z8673 Personal history of transient ischemic attack (TIA), and cerebral infarction without residual deficits: Secondary | ICD-10-CM | POA: Diagnosis not present

## 2021-10-18 DIAGNOSIS — D89811 Chronic graft-versus-host disease: Secondary | ICD-10-CM | POA: Diagnosis not present

## 2021-10-18 DIAGNOSIS — Z86718 Personal history of other venous thrombosis and embolism: Secondary | ICD-10-CM | POA: Diagnosis not present

## 2021-10-18 DIAGNOSIS — T50905A Adverse effect of unspecified drugs, medicaments and biological substances, initial encounter: Secondary | ICD-10-CM | POA: Diagnosis not present

## 2021-10-18 DIAGNOSIS — C9201 Acute myeloblastic leukemia, in remission: Secondary | ICD-10-CM | POA: Diagnosis not present

## 2021-10-18 DIAGNOSIS — Z9481 Bone marrow transplant status: Secondary | ICD-10-CM | POA: Diagnosis not present

## 2021-10-18 DIAGNOSIS — C9200 Acute myeloblastic leukemia, not having achieved remission: Secondary | ICD-10-CM | POA: Diagnosis not present

## 2021-10-18 DIAGNOSIS — Z9484 Stem cells transplant status: Secondary | ICD-10-CM | POA: Diagnosis not present

## 2021-10-18 DIAGNOSIS — E2749 Other adrenocortical insufficiency: Secondary | ICD-10-CM | POA: Diagnosis not present

## 2021-10-18 DIAGNOSIS — Z23 Encounter for immunization: Secondary | ICD-10-CM | POA: Diagnosis not present

## 2021-10-18 DIAGNOSIS — K862 Cyst of pancreas: Secondary | ICD-10-CM | POA: Diagnosis not present

## 2021-10-18 DIAGNOSIS — D469 Myelodysplastic syndrome, unspecified: Secondary | ICD-10-CM | POA: Diagnosis not present

## 2021-10-18 DIAGNOSIS — I10 Essential (primary) hypertension: Secondary | ICD-10-CM | POA: Diagnosis not present

## 2021-10-18 DIAGNOSIS — Z8616 Personal history of COVID-19: Secondary | ICD-10-CM | POA: Diagnosis not present

## 2021-10-19 ENCOUNTER — Encounter: Payer: Self-pay | Admitting: Family Medicine

## 2021-10-25 DIAGNOSIS — H04123 Dry eye syndrome of bilateral lacrimal glands: Secondary | ICD-10-CM | POA: Diagnosis not present

## 2021-10-25 DIAGNOSIS — D89813 Graft-versus-host disease, unspecified: Secondary | ICD-10-CM | POA: Diagnosis not present

## 2021-10-25 DIAGNOSIS — H018 Other specified inflammations of eyelid: Secondary | ICD-10-CM | POA: Diagnosis not present

## 2021-10-25 DIAGNOSIS — Z961 Presence of intraocular lens: Secondary | ICD-10-CM | POA: Diagnosis not present

## 2021-11-23 ENCOUNTER — Encounter: Payer: Self-pay | Admitting: Family Medicine

## 2021-11-26 ENCOUNTER — Encounter: Payer: Self-pay | Admitting: Family Medicine

## 2021-11-28 NOTE — Telephone Encounter (Signed)
Left patient vm to schedule

## 2021-12-11 ENCOUNTER — Telehealth: Payer: Medicare Other | Admitting: Family Medicine

## 2021-12-25 ENCOUNTER — Ambulatory Visit (INDEPENDENT_AMBULATORY_CARE_PROVIDER_SITE_OTHER): Payer: Medicare Other

## 2021-12-25 DIAGNOSIS — Z0000 Encounter for general adult medical examination without abnormal findings: Secondary | ICD-10-CM | POA: Diagnosis not present

## 2021-12-25 NOTE — Progress Notes (Addendum)
Virtual Visit via Telephone Note  I connected with  Brandon [name] on 12/25/21 at  9:00 AM EDT by telephone and verified that I am speaking with the correct person using two identifiers.  Medicare Annual Wellness visit completed telephonically due to Covid-19 pandemic.   Persons participating in this call: This Health Coach and this patient.   Location: Patient: Home Provider: Office    I discussed the limitations, risks, security and privacy concerns of performing an evaluation and management service by telephone and the availability of in person appointments. The patient expressed understanding and agreed to proceed.  Unable to perform video visit due to video visit attempted and failed and/or patient does not have video capability.   Some vital signs may be absent or patient reported.   Brandon Brace, LPN   Subjective:   Brandon [name] is a 71 y.o. male who presents for Medicare Annual/Subsequent preventive examination.  Review of Systems     Cardiac Risk Factors include: advanced age (>83mn, >>57women);dyslipidemia;hypertension;male gender;obesity (BMI >30kg/m2)     Objective:    There were no vitals filed for this visit. There is no height or weight on file to calculate BMI.     12/25/2021    9:09 AM 09/08/2020    8:20 AM 09/09/2019    2:30 AM 09/08/2019    7:30 PM 09/04/2019    2:30 PM 09/03/2019    7:30 PM 05/06/2019    9:59 AM  Advanced Directives  Does Patient Have a Medical Advance Directive? Yes Yes Yes Yes Yes Yes Yes  Type of Advance Directive Healthcare Power of Attorney Living will;Healthcare Power of ARio LucioLiving will HGrand MaraisLiving will HPleasant HillLiving will HSaddle Rock EstatesOut of facility DNR (pink MOST or yellow form);Living will Living will;Healthcare Power of Attorney  Does patient want to make changes to medical advance directive?   No - Patient declined No - Patient  declined No - Patient declined  No - Patient declined  Copy of H[place]in Chart? No - copy requested No - copy requested  No - copy requested No - copy requested No - copy requested No - copy requested    Current Medications (verified) Outpatient Encounter Medications as of 12/25/2021  Medication Sig   acyclovir (ZOVIRAX) 400 MG tablet Take 400 mg by mouth 2 (two) times daily.   albuterol (VENTOLIN HFA) 108 (90 Base) MCG/ACT inhaler Inhale into the lungs in the morning and at bedtime.   Alpha-Lipoic Acid 600 MG CAPS Take by mouth.   amLODipine (NORVASC) 10 MG tablet TAKE 1 TABLET BY MOUTH EVERY DAY   aspirin 325 MG EC tablet Take 325 mg by mouth daily.   Cholecalciferol (VITAMIN D) 50 MCG (2000 UT) CAPS Take 2,000 Units by mouth at bedtime.    famotidine (PEPCID) 20 MG tablet TAKE 1 TABLET BY MOUTH EVERY DAY AT NIGHT   fluticasone (FLOVENT HFA) 220 MCG/ACT inhaler Inhale 1 puff into the lungs 2 (two) times daily.   hydrocortisone (CORTEF) 10 MG tablet Take by mouth.   hydrocortisone (CORTEF) 20 MG tablet Take 20 mg by mouth daily.   Lancets (ONETOUCH DELICA PLUS [id number] MISC    loratadine (CLARITIN) 10 MG tablet Take 10 mg by mouth daily.   Magnesium Oxide 400 MG CAPS Take 1 capsule (400 mg total) by mouth 5 (five) times daily. (Patient taking differently: Take 400 mg by mouth daily.)   metFORMIN (GLUCOPHAGE) 500  MG tablet Take 500 mg by mouth 2 (two) times daily.   metoprolol tartrate (LOPRESSOR) 25 MG tablet Take 1 tablet by mouth 2 (two) times daily.   montelukast (SINGULAIR) 10 MG tablet Take 10 mg by mouth daily.    Multiple Vitamin (MULTI-VITAMIN) tablet Take by mouth.   niacinamide 500 MG tablet Take 500 mg by mouth 2 (two) times daily with a meal.   omeprazole (PRILOSEC) 10 MG capsule TAKE 1 CAPSULE BY MOUTH EVERY DAY (Patient taking differently: 20 mg.)   ondansetron (ZOFRAN-ODT) 8 MG disintegrating tablet Take 8 mg by mouth every 8 (eight) hours as needed for  nausea or vomiting.   polyethylene glycol (MIRALAX / GLYCOLAX) 17 g packet Take 1 packet by mouth daily as needed (constipation).    polyvinyl alcohol (LIQUIFILM TEARS) 1.4 % ophthalmic solution Place 1 drop into both eyes as needed for dry eyes.   rosuvastatin (CRESTOR) 40 MG tablet TAKE 1 TABLET BY MOUTH EVERY DAY   senna-docusate (SENOKOT-S) 8.6-50 MG tablet Take 2 tablets by mouth at bedtime.    sildenafil (REVATIO) 20 MG tablet Take 2-5 tablets as needed once every 48 hours for erectile dysfunction   amoxicillin (AMOXIL) 500 MG capsule SMARTSIG:4 Capsule(s) By Mouth (Patient not taking: Reported on 12/25/2021)   No facility-administered encounter medications on file as of 12/25/2021.    Allergies (verified) Levofloxacin and Tape   History: Past Medical History:  Diagnosis Date   Adrenocortical insufficiency ([place]) 08/06/2017   Patient with 2 hospitalizations in January 2019- treated with fluids, antibiotics, steroids- ultimately thought most likely due to adrenal insufficiency with no obvious source of infection found   Allergy    seasonal/animals   Chronic prostatitis 12/17/2008   Diverticulosis 01/16/2011   History of diverticulitis    ERECTILE DYSFUNCTION 02/07/2007   HYPERLIPIDEMIA 02/04/2007   HYPERTENSION 02/04/2007   NEOPLASM, MALIGNANT, SKIN, BACK 02/09/2009   Pneumonia, viral 12/2017   S/P bone marrow transplant [facility])    Past Surgical History:  Procedure Laterality Date   CATARACT EXTRACTION Left 06/2018   CATARACT EXTRACTION Right 10/08/2019   none     SCALP LESION REMOVAL W/ FLAP AND SKIN GRAFT Left    Family History  Problem Relation Age of Onset   Breast cancer Mother    CVA Mother        quit taking HTN meds   Alzheimer's disease Father        late 59s. mid 83s   Asperger's syndrome Son    Social History   Socioeconomic History   Marital status: Married    Spouse name: Not on file   Number of children: Not on file   Years of education: Not on file    Highest education level: Not on file  Occupational History   Not on file  Tobacco Use   Smoking status: Never   Smokeless tobacco: Never  Substance and Sexual Activity   Alcohol use: Yes    Alcohol/week: 3.0 standard drinks of alcohol    Types: 3 Standard drinks or equivalent per week   Drug use: No   Sexual activity: Yes  Other Topics Concern   Not on file  Social History Narrative   Married 1973. Wife has Parkinsons. 2 daughters, 1 son (middle). 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, primarily Government social research officer: hiking, Marketing executive, Scientist, water quality   Social Determinants of Health   Financial Resource Strain: [place]  (  12/25/2021)   Overall Financial Resource Strain (CARDIA)    Difficulty of Paying Living Expenses: Not hard at all  Food Insecurity: No Food Insecurity (12/25/2021)   Hunger Vital Sign    Worried About Running Out of Food in the Last Year: Never true    Ran Out of Food in the Last Year: Never true  Transportation Needs: No Transportation Needs (12/25/2021)   PRAPARE - Hydrologist (Medical): No    Lack of Transportation (Non-Medical): No  Physical Activity: Insufficiently Active (12/25/2021)   Exercise Vital Sign    Days of Exercise per Week: 3 days    Minutes of Exercise per Session: 30 min  Stress: No Stress Concern Present (12/25/2021)   [place]    Feeling of Stress : Not at all  Social Connections: Moderately Isolated (12/25/2021)   Social Connection and Isolation Panel [NHANES]    Frequency of Communication with Friends and Family: More than three times a week    Frequency of Social Gatherings with Friends and Family: More than three times a week    Attends Religious Services: Never    Marine scientist or Organizations: No    Attends Music therapist: Never    Marital Status: Married    Tobacco  Counseling Counseling given: Not Answered   Clinical Intake:  Pre-visit preparation completed: Yes  Pain : No/denies pain     BMI - recorded: 33.35 Nutritional Status: BMI > 30  Obese Nutritional Risks: None Diabetes: No  How often do you need to have someone help you when you read instructions, pamphlets, or other written materials from your doctor or pharmacy?: 1 - Never  Diabetic?no  Interpreter Needed?: No  Information entered by :: Charlott Rakes, LPN   Activities of Daily Living    12/25/2021    9:11 AM  In your present state of health, do you have any difficulty performing the following activities:  Hearing? 1  Comment high frequency loss  Vision? 0  Difficulty concentrating or making decisions? 0  Walking or climbing stairs? 0  Dressing or bathing? 0  Doing errands, shopping? 0  Preparing Food and eating ? N  Using the Toilet? N  In the past six months, have you accidently leaked urine? N  Do you have problems with loss of bowel control? N  Managing your Medications? N  Managing your Finances? N  Housekeeping or managing your Housekeeping? N    Patient Care Team: [name], MD as PCP - General (Family Medicine) Nicholas Lose, MD as Referring Physician (Oncology) Rosalene Billings, MD as Consulting Physician (Neurology) [place], Marvia Pickles, MD as Consulting Physician (Ophthalmology) Liz Malady Diprincipe, MD as Consulting Physician (Endocrinology) Govert, Natale Lay, MD as Consulting Physician (Pulmonary Disease) [place], Adela Simmie Davies, MD as Consulting Physician (Dermatology) Cephas Darby, Lenetta Quaker, MD as Consulting Physician (Gastroenterology)  Indicate any recent Medical Services you may have received from other than [facility] providers in the past year (date may be approximate).     Assessment:   This is a routine wellness examination for Leondro.  Hearing/Vision screen Hearing Screening - Comments:: Pt stated high frequency  loss  Vision Screening - Comments:: Pt follows up with dr Sharen Counter for annual eye exams   Dietary issues and exercise activities discussed: Current Exercise Habits: Home exercise routine, Type of exercise: walking, Time (Minutes): 30, Frequency (Times/Week): 3, Weekly Exercise (Minutes/Week): 90   Goals Addressed  This Visit's Progress    Patient Stated       Stay healthy        Depression Screen    12/25/2021    9:08 AM 01/17/2021    8:41 AM 09/08/2020    8:19 AM 08/26/2020   10:41 AM 01/05/2020    3:01 PM 07/27/2019    8:43 AM 05/06/2019    9:59 AM  PHQ 2/9 Scores  PHQ - 2 Score 0 0 0 0 0 0 0    Fall Risk    12/25/2021    9:10 AM 01/17/2021    8:41 AM 09/08/2020    8:21 AM 08/26/2020   10:41 AM 06/14/2020   12:52 PM  Fall Risk   Falls in the past year? 0 0 0 0 0  Number falls in past yr: 0 0 0 0 0  Injury with Fall? 0 0 0 0 0  Risk for fall due to : Impaired balance/gait;Impaired vision No Fall Risks Impaired vision    Risk for fall due to: Comment due to neuropathy      Follow up Falls prevention discussed Falls evaluation completed Falls prevention discussed      FALL RISK PREVENTION PERTAINING TO THE HOME:  Any stairs in or around the home? Yes  If so, are there any without handrails? No  Home free of loose throw rugs in walkways, pet beds, electrical cords, etc? Yes  Adequate lighting in your home to reduce risk of falls? Yes   ASSISTIVE DEVICES UTILIZED TO PREVENT FALLS:  Life alert? No  Use of a cane, walker or w/c? No  Grab bars in the bathroom? Yes  Shower chair or bench in shower? Yes  Elevated toilet seat or a handicapped toilet? Yes   TIMED UP AND GO:  Was the test performed? No .   Cognitive Function:        12/25/2021    9:11 AM 09/08/2020    8:23 AM 05/06/2019   10:00 AM  6CIT Screen  What Year? 0 points 0 points 0 points  What month? 0 points 0 points 0 points  What time? 0 points  0 points  Count back from 20 0 points 0 points 0  points  Months in reverse 0 points 0 points 0 points  Repeat phrase 0 points 0 points 0 points  Total Score 0 points  0 points    Immunizations Immunization History  Administered Date(s) Administered   DTaP / HiB / IPV 12/04/2017, 03/05/2018, 08/27/2018   Fluad Quad(high Dose 65+) 03/17/2020   Hepatitis A 12/25/2005, 11/25/2007   Hepatitis B 05/25/1996, 06/25/1997, 12/01/1998   Hepatitis B, adult 12/04/2017, 03/05/2018, 08/27/2018   Influenza, High Dose Seasonal PF 07/09/2017, 04/16/2018, 02/17/2019, 03/29/2021   Influenza,inj,Quad PF,6+ Mos 02/18/2013, 05/03/2014   Influenza-Unspecified 03/16/2015   MMR 08/26/2020   PFIZER(Purple Top)SARS-COV-2 Vaccination 07/28/2019, 08/20/2019, 02/10/2020, 07/26/2020   PPD Test 08/15/2012   Pfizer Covid-19 Vaccine Bivalent Booster 60yr & up 04/19/2021, 10/18/2021   Pneumococcal Conjugate-13 05/11/2015, 12/04/2017, 03/05/2018, 08/27/2018   Pneumococcal Polysaccharide-23 05/03/2014, 05/09/2020   Td 12/05/2005   Tdap 08/27/2018   Zoster Recombinat (Shingrix) 04/19/2021, 10/18/2021   Zoster, Live 02/15/2012    TDAP status: Up to date  Flu Vaccine status: Up to date  Pneumococcal vaccine status: Up to date  Covid-19 vaccine status: Completed vaccines  Qualifies for Shingles Vaccine? Yes   Zostavax completed Yes   Shingrix Completed?: Yes  Screening Tests Health Maintenance  Topic Date Due   URINE  MICROALBUMIN  03/17/2021   INFLUENZA VACCINE  01/23/2022   COLONOSCOPY (Pts 45-50yr Insurance coverage will need to be confirmed)  11/19/2023   TETANUS/TDAP  08/26/2028   Pneumonia Vaccine 71 Years old  Completed   COVID-19 Vaccine  Completed   Hepatitis C Screening  Completed   Zoster Vaccines- Shingrix  Completed   HPV VACCINES  Aged Out    Health Maintenance  Health Maintenance Due  Topic Date Due   URINE MICROALBUMIN  03/17/2021    Colorectal cancer screening: Type of screening: Colonoscopy. Completed 11/19/18. Repeat every 5  years   Additional Screening:  Hepatitis C Screening:  Completed 11/02/15  Vision Screening: Recommended annual ophthalmology exams for early detection of glaucoma and other disorders of the eye. Is the patient up to date with their annual eye exam?  Yes  Who is the provider or what is the name of the office in which the patient attends annual eye exams? Dr PSharen Counter If pt is not established with a provider, would they like to be referred to a provider to establish care? No .   Dental Screening: Recommended annual dental exams for proper oral hygiene  Community Resource Referral / Chronic Care Management: CRR required this visit?  No   CCM required this visit?  No      Plan:     I have personally reviewed and noted the following in the patient's chart:   Medical and social history Use of alcohol, tobacco or illicit drugs  Current medications and supplements including opioid prescriptions. Patient is not currently taking opioid prescriptions. Functional ability and status Nutritional status Physical activity Advanced directives List of other physicians Hospitalizations, surgeries, and ER visits in previous 12 months Vitals Screenings to include cognitive, depression, and falls Referrals and appointments  In addition, I have reviewed and discussed with patient certain preventive protocols, quality metrics, and best practice recommendations. A written personalized care plan for preventive services as well as general preventive health recommendations were provided to patient.     TWillette Brace LPN   78/09/1322  Nurse Notes: None

## 2021-12-25 NOTE — Patient Instructions (Signed)
Brandon [name] , Thank you for taking time to come for your Medicare Wellness Visit. I appreciate your ongoing commitment to your health goals. Please review the following plan we discussed and let me know if I can assist you in the future.   Screening recommendations/referrals: Colonoscopy: Done 11/19/18 repeat every 5 years  Recommended yearly ophthalmology/optometry visit for glaucoma screening and checkup Recommended yearly dental visit for hygiene and checkup  Vaccinations: Influenza vaccine: Done 03/29/21 repeat every year  Pneumococcal vaccine: Up to date Tdap vaccine: Done 08/27/18 repeat every 10 years  Shingles vaccine: Completed 04/19/21 & 10/18/21   Covid-19: Completed 2/2, 2/25, 02/10/20 & 2/1, 04/19/21 & 10/18/21  Advanced directives: Please bring a copy of your health care power of attorney and living will to the office at your convenience.  Conditions/risks identified: stay healthy   Next appointment: Follow up in one year for your annual wellness visit.   Preventive Care 19 Years and Older, Male Preventive care refers to lifestyle choices and visits with your health care provider that can promote health and wellness. What does preventive care include? A yearly physical exam. This is also called an annual well check. Dental exams once or twice a year. Routine eye exams. Ask your health care provider how often you should have your eyes checked. Personal lifestyle choices, including: Daily care of your teeth and gums. Regular physical activity. Eating a healthy diet. Avoiding tobacco and drug use. Limiting alcohol use. Practicing safe sex. Taking low doses of aspirin every day. Taking vitamin and mineral supplements as recommended by your health care provider. What happens during an annual well check? The services and screenings done by your health care provider during your annual well check will depend on your age, overall health, lifestyle risk factors, and family history  of disease. Counseling  Your health care provider may ask you questions about your: Alcohol use. Tobacco use. Drug use. Emotional well-being. Home and relationship well-being. Sexual activity. Eating habits. History of falls. Memory and ability to understand (cognition). Work and work Statistician. Screening  You may have the following tests or measurements: Height, weight, and BMI. Blood pressure. Lipid and cholesterol levels. These may be checked every 5 years, or more frequently if you are over 26 years old. Skin check. Lung cancer screening. You may have this screening every year starting at age 61 if you have a 30-pack-year history of smoking and currently smoke or have quit within the past 15 years. Fecal occult blood test (FOBT) of the stool. You may have this test every year starting at age 76. Flexible sigmoidoscopy or colonoscopy. You may have a sigmoidoscopy every 5 years or a colonoscopy every 10 years starting at age 15. Prostate cancer screening. Recommendations will vary depending on your family history and other risks. Hepatitis C blood test. Hepatitis B blood test. Sexually transmitted disease (STD) testing. Diabetes screening. This is done by checking your blood sugar (glucose) after you have not eaten for a while (fasting). You may have this done every 1-3 years. Abdominal aortic aneurysm (AAA) screening. You may need this if you are a current or former smoker. Osteoporosis. You may be screened starting at age 20 if you are at high risk. Talk with your health care provider about your test results, treatment options, and if necessary, the need for more tests. Vaccines  Your health care provider may recommend certain vaccines, such as: Influenza vaccine. This is recommended every year. Tetanus, diphtheria, and acellular pertussis (Tdap, Td) vaccine. You may  need a Td booster every 10 years. Zoster vaccine. You may need this after age 50. Pneumococcal 13-valent  conjugate (PCV13) vaccine. One dose is recommended after age 5. Pneumococcal polysaccharide (PPSV23) vaccine. One dose is recommended after age 7. Talk to your health care provider about which screenings and vaccines you need and how often you need them. This information is not intended to replace advice given to you by your health care provider. Make sure you discuss any questions you have with your health care provider. Document Released: 07/08/2015 Document Revised: 02/29/2016 Document Reviewed: 04/12/2015 Elsevier Interactive Patient Education  2017 [employer] Prevention in the Home Falls can cause injuries. They can happen to people of all ages. There are many things you can do to make your home safe and to help prevent falls. What can I do on the outside of my home? Regularly fix the edges of walkways and driveways and fix any cracks. Remove anything that might make you trip as you walk through a door, such as a raised step or threshold. Trim any bushes or trees on the path to your home. Use bright outdoor lighting. Clear any walking paths of anything that might make someone trip, such as rocks or tools. Regularly check to see if handrails are loose or broken. Make sure that both sides of any steps have handrails. Any raised decks and porches should have guardrails on the edges. Have any leaves, snow, or ice cleared regularly. Use sand or salt on walking paths during winter. Clean up any spills in your garage right away. This includes oil or grease spills. What can I do in the bathroom? Use night lights. Install grab bars by the toilet and in the tub and shower. Do not use towel bars as grab bars. Use non-skid mats or decals in the tub or shower. If you need to sit down in the shower, use a plastic, non-slip stool. Keep the floor dry. Clean up any water that spills on the floor as soon as it happens. Remove soap buildup in the tub or shower regularly. Attach bath mats  securely with double-sided non-slip rug tape. Do not have throw rugs and other things on the floor that can make you trip. What can I do in the bedroom? Use night lights. Make sure that you have a light by your bed that is easy to reach. Do not use any sheets or blankets that are too big for your bed. They should not hang down onto the floor. Have a firm chair that has side arms. You can use this for support while you get dressed. Do not have throw rugs and other things on the floor that can make you trip. What can I do in the kitchen? Clean up any spills right away. Avoid walking on wet floors. Keep items that you use a lot in easy-to-reach places. If you need to reach something above you, use a strong step stool that has a grab bar. Keep electrical cords out of the way. Do not use floor polish or wax that makes floors slippery. If you must use wax, use non-skid floor wax. Do not have throw rugs and other things on the floor that can make you trip. What can I do with my stairs? Do not leave any items on the stairs. Make sure that there are handrails on both sides of the stairs and use them. Fix handrails that are broken or loose. Make sure that handrails are as long as the stairways.  Check any carpeting to make sure that it is firmly attached to the stairs. Fix any carpet that is loose or worn. Avoid having throw rugs at the top or bottom of the stairs. If you do have throw rugs, attach them to the floor with carpet tape. Make sure that you have a light switch at the top of the stairs and the bottom of the stairs. If you do not have them, ask someone to add them for you. What else can I do to help prevent falls? Wear shoes that: Do not have high heels. Have rubber bottoms. Are comfortable and fit you well. Are closed at the toe. Do not wear sandals. If you use a stepladder: Make sure that it is fully opened. Do not climb a closed stepladder. Make sure that both sides of the stepladder  are locked into place. Ask someone to hold it for you, if possible. Clearly mark and make sure that you can see: Any grab bars or handrails. First and last steps. Where the edge of each step is. Use tools that help you move around (mobility aids) if they are needed. These include: Canes. Walkers. Scooters. Crutches. Turn on the lights when you go into a dark area. Replace any light bulbs as soon as they burn out. Set up your furniture so you have a clear path. Avoid moving your furniture around. If any of your floors are uneven, fix them. If there are any pets around you, be aware of where they are. Review your medicines with your doctor. Some medicines can make you feel dizzy. This can increase your chance of falling. Ask your doctor what other things that you can do to help prevent falls. This information is not intended to replace advice given to you by your health care provider. Make sure you discuss any questions you have with your health care provider. Document Released: 04/07/2009 Document Revised: 11/17/2015 Document Reviewed: 07/16/2014 Elsevier Interactive Patient Education  2017 [employer].

## 2021-12-28 DIAGNOSIS — L821 Other seborrheic keratosis: Secondary | ICD-10-CM | POA: Diagnosis not present

## 2021-12-28 DIAGNOSIS — I8311 Varicose veins of right lower extremity with inflammation: Secondary | ICD-10-CM | POA: Diagnosis not present

## 2021-12-28 DIAGNOSIS — L570 Actinic keratosis: Secondary | ICD-10-CM | POA: Diagnosis not present

## 2021-12-28 DIAGNOSIS — D692 Other nonthrombocytopenic purpura: Secondary | ICD-10-CM | POA: Diagnosis not present

## 2021-12-28 DIAGNOSIS — Z85828 Personal history of other malignant neoplasm of skin: Secondary | ICD-10-CM | POA: Diagnosis not present

## 2021-12-28 DIAGNOSIS — I872 Venous insufficiency (chronic) (peripheral): Secondary | ICD-10-CM | POA: Diagnosis not present

## 2021-12-28 DIAGNOSIS — L218 Other seborrheic dermatitis: Secondary | ICD-10-CM | POA: Diagnosis not present

## 2021-12-28 DIAGNOSIS — L720 Epidermal cyst: Secondary | ICD-10-CM | POA: Diagnosis not present

## 2021-12-28 DIAGNOSIS — I8312 Varicose veins of left lower extremity with inflammation: Secondary | ICD-10-CM | POA: Diagnosis not present

## 2021-12-28 DIAGNOSIS — D2271 Melanocytic nevi of right lower limb, including hip: Secondary | ICD-10-CM | POA: Diagnosis not present

## 2021-12-28 DIAGNOSIS — L738 Other specified follicular disorders: Secondary | ICD-10-CM | POA: Diagnosis not present

## 2021-12-29 DIAGNOSIS — R112 Nausea with vomiting, unspecified: Secondary | ICD-10-CM | POA: Diagnosis not present

## 2021-12-29 DIAGNOSIS — Z79899 Other long term (current) drug therapy: Secondary | ICD-10-CM | POA: Diagnosis not present

## 2021-12-29 DIAGNOSIS — E2740 Unspecified adrenocortical insufficiency: Secondary | ICD-10-CM | POA: Diagnosis not present

## 2021-12-29 DIAGNOSIS — E2749 Other adrenocortical insufficiency: Secondary | ICD-10-CM | POA: Diagnosis not present

## 2021-12-29 DIAGNOSIS — T380X5A Adverse effect of glucocorticoids and synthetic analogues, initial encounter: Secondary | ICD-10-CM | POA: Diagnosis not present

## 2021-12-29 DIAGNOSIS — R739 Hyperglycemia, unspecified: Secondary | ICD-10-CM | POA: Diagnosis not present

## 2021-12-29 DIAGNOSIS — T380X5S Adverse effect of glucocorticoids and synthetic analogues, sequela: Secondary | ICD-10-CM | POA: Diagnosis not present

## 2021-12-29 LAB — HEMOGLOBIN A1C: Hemoglobin A1C: 6.5

## 2022-01-08 ENCOUNTER — Ambulatory Visit (INDEPENDENT_AMBULATORY_CARE_PROVIDER_SITE_OTHER): Payer: Medicare Other | Admitting: Family Medicine

## 2022-01-08 ENCOUNTER — Encounter: Payer: Self-pay | Admitting: Family Medicine

## 2022-01-08 VITALS — BP 130/68 | HR 70 | Temp 99.1°F | Ht 70.0 in | Wt 227.1 lb

## 2022-01-08 DIAGNOSIS — I10 Essential (primary) hypertension: Secondary | ICD-10-CM

## 2022-01-08 DIAGNOSIS — R739 Hyperglycemia, unspecified: Secondary | ICD-10-CM

## 2022-01-08 DIAGNOSIS — E2740 Unspecified adrenocortical insufficiency: Secondary | ICD-10-CM | POA: Diagnosis not present

## 2022-01-08 DIAGNOSIS — T380X5A Adverse effect of glucocorticoids and synthetic analogues, initial encounter: Secondary | ICD-10-CM

## 2022-01-08 DIAGNOSIS — E785 Hyperlipidemia, unspecified: Secondary | ICD-10-CM

## 2022-01-08 DIAGNOSIS — N529 Male erectile dysfunction, unspecified: Secondary | ICD-10-CM | POA: Diagnosis not present

## 2022-01-08 MED ORDER — SILDENAFIL CITRATE 100 MG PO TABS
100.0000 mg | ORAL_TABLET | Freq: Every day | ORAL | 3 refills | Status: AC | PRN
Start: 2022-01-08 — End: ?

## 2022-01-08 NOTE — Patient Instructions (Addendum)
Ok to trial sildenafil but start with half tablet - only go to full if not effective on first trial  Reasonable to trial effexor/venlafaxine 25 mg twice daily Taking the medicine as directed and not missing any doses is one of the best things you can do to treat your anxiety.  Here are some things to keep in mind:  Side effects (stomach upset, some increased anxiety) may happen before you notice a benefit.  These side effects typically go away over time. Changes to your dose of medicine or a change in medication all together is sometimes necessary Most people need to be on medication at least 6-12 months Many people will notice an improvement within two weeks but the full effect of the medication can take up to 6-8 weeks Stopping the medication when you start feeling better often results in a return of symptoms If you start having thoughts of hurting yourself or others after starting this medicine, call our office immediately at [phone] or seek care through 911.    Recommended follow up: Return for next already scheduled visit or sooner if needed.

## 2022-01-08 NOTE — Progress Notes (Signed)
Phone [phone] In person visit   Subjective:   Brandon [name] is a 71 y.o. year old very pleasant male patient who presents for/with See problem oriented charting Chief Complaint  Patient presents with   Follow-up    Pt is here to discuss Effexor that has been Rx'd from another doctor.   Hyperlipidemia   Hypertension    Past Medical History-  Patient Active Problem List   Diagnosis Date Noted   Allergic rhinitis 10/03/2019    Priority: High   Pancreatic lesion 10/03/2019    Priority: High   History of CVA (cerebrovascular accident) 03/10/2019    Priority: High   Adrenal insufficiency ([place]) 08/06/2017    Priority: High   GVHD (graft versus host disease) ([place]) 03/04/2017    Priority: High   History of myelodysplastic syndrome 12/06/2015    Priority: High   Steroid-induced hyperglycemia 08/16/2014    Priority: High   Erectile dysfunction 01/08/2022    Priority: Medium    Fatty liver 01/05/2020    Priority: Medium    Osteopenia 10/05/2019    Priority: Medium    History of DVT (deep vein thrombosis) 09/25/2018    Priority: Medium    Post chemotherapy Dry eyes due to decreased tear production 08/06/2017    Priority: Medium    post chemotherapy Peripheral neuropathy 08/06/2017    Priority: Medium    BPH associated with nocturia 11/15/2014    Priority: Medium    Hyperlipidemia 02/04/2007    Priority: Medium    Essential hypertension 02/04/2007    Priority: Medium    History of stem cell transplant ([place]) 03/04/2017    Priority: Low   Leukopenia 11/15/2014    Priority: Low   Chest pain 08/13/2014    Priority: Low   GERD (gastroesophageal reflux disease) 05/03/2014    Priority: Low   Cervical disc disorder with radiculopathy of cervical region 07/14/2010    Priority: [place] LOSS BILATERAL 07/14/2010    Priority: Low   ERECTILE DYSFUNCTION 02/07/2007    Priority: Low    Medications- reviewed and updated Current Outpatient Medications   Medication Sig Dispense Refill   acyclovir (ZOVIRAX) 400 MG tablet Take 400 mg by mouth 2 (two) times daily.     albuterol (VENTOLIN HFA) 108 (90 Base) MCG/ACT inhaler Inhale into the lungs in the morning and at bedtime.     Alpha-Lipoic Acid 600 MG CAPS Take by mouth.     amLODipine (NORVASC) 10 MG tablet TAKE 1 TABLET BY MOUTH EVERY DAY 90 tablet 3   amoxicillin (AMOXIL) 500 MG capsule      aspirin 325 MG EC tablet Take 325 mg by mouth daily.     Cholecalciferol (VITAMIN D) 50 MCG (2000 UT) CAPS Take 2,000 Units by mouth at bedtime.      famotidine (PEPCID) 20 MG tablet TAKE 1 TABLET BY MOUTH EVERY DAY AT NIGHT 90 tablet 3   fluticasone (FLOVENT HFA) 220 MCG/ACT inhaler Inhale 1 puff into the lungs 2 (two) times daily.     hydrocortisone (CORTEF) 10 MG tablet Take 10-15 mg by mouth 2 (two) times daily. As of 01/08/22- 15 mg in AM and 10 mg in PM     Lancets (ONETOUCH DELICA PLUS [id number]) MISC      loratadine (CLARITIN) 10 MG tablet Take 10 mg by mouth daily.     Magnesium Oxide 400 MG CAPS Take 1 capsule (400 mg total) by mouth 5 (five) times daily. (Patient taking differently: Take  400 mg by mouth daily.)     metFORMIN (GLUCOPHAGE) 500 MG tablet Take 500 mg by mouth 2 (two) times daily.     metoprolol tartrate (LOPRESSOR) 25 MG tablet Take 1 tablet by mouth 2 (two) times daily.     montelukast (SINGULAIR) 10 MG tablet Take 10 mg by mouth daily.      Multiple Vitamin (MULTI-VITAMIN) tablet Take by mouth.     niacinamide 500 MG tablet Take 500 mg by mouth 2 (two) times daily with a meal.     omeprazole (PRILOSEC) 10 MG capsule TAKE 1 CAPSULE BY MOUTH EVERY DAY (Patient taking differently: 20 mg.) 90 capsule 1   ondansetron (ZOFRAN-ODT) 8 MG disintegrating tablet Take 8 mg by mouth every 8 (eight) hours as needed for nausea or vomiting.     polyethylene glycol (MIRALAX / GLYCOLAX) 17 g packet Take 1 packet by mouth daily as needed (constipation).      polyvinyl alcohol (LIQUIFILM TEARS) 1.4  % ophthalmic solution Place 1 drop into both eyes as needed for dry eyes.     rosuvastatin (CRESTOR) 40 MG tablet TAKE 1 TABLET BY MOUTH EVERY DAY 90 tablet 3   senna-docusate (SENOKOT-S) 8.6-50 MG tablet Take 2 tablets by mouth at bedtime.      sildenafil (VIAGRA) 100 MG tablet Take 1 tablet (100 mg total) by mouth daily as needed for erectile dysfunction. 30 tablet 3   No current facility-administered medications for this visit.     Objective:  BP 130/68   Pulse 70   Temp 99.1 F (37.3 C)   Ht '5\' 10"'$  (1.778 m)   Wt 227 lb 1.6 oz (103 kg)   SpO2 95%   BMI 32.59 kg/m  Gen: NAD, resting comfortably CV: RRR no murmurs rubs or gallops Lungs: CTAB no crackles, wheeze, rhonchi Abdomen: soft/nontender/nondistended/normal bowel sounds. No rebound or guarding.  Ext: trace edema Skin: warm, dry Neuro: grossly normal, moves all extremities      Assessment and Plan   #Social update-had big [place] trip in June which included flying.  Also still has upcoming [place] cruise end of August  - Some motion sickness on bonine but after the trip. May try dramamine instead -We could trial scopolamine patch if needed  #History of myelodysplastic syndrome that transitioned to AML history-has continued to do well and now over 5 years out from stem cell transplant   #Adrenal insufficiency-patient continues on hydrocortisone with 15 in the a.m. and 10 in the p.m (took 20, 10 on the trip).  Will be working with a new adrenal specialist/endocrinologist Dr. Alphonzo Severance first visit on July 7 -GI at [facility] will give another opinion on nausea- possible they are separate but linked issues per Dr. Donneta Romberg  # Anxiety and depressed mood S: Medication: venlafaxine/Effexor rx by oncology team-25 mg twice daily    01/08/2022    4:51 PM 12/25/2021    9:08 AM 01/17/2021    8:41 AM  Depression screen PHQ 2/9  Decreased Interest 2 0 0  Down, Depressed, Hopeless 1 0 0  PHQ - 2 Score 3 0 0  Altered sleeping 2     Tired, decreased energy 2    Change in appetite 0    Feeling bad or failure about yourself  0    Trouble concentrating 0    Moving slowly or fidgety/restless 0    Suicidal thoughts 0    PHQ-9 Score 7    Difficult doing work/chores Somewhat difficult  01/08/2022    4:53 PM  GAD 7 : Generalized Anxiety Score  Nervous, Anxious, on Edge 2  Control/stop worrying 1  Worry too much - different things 3  Trouble relaxing 1  Restless 0  Easily annoyed or irritable 1  Afraid - awful might happen 0  Total GAD 7 Score 8  Anxiety Difficulty Somewhat difficult  A/P: Patient with some mild anxiety and depressed mood-I think his history of battling MDS/AML as well as adrenal insufficiency issues afterwards and several other complications has been rather traumatic-I agree with trial of venlafaxine 25 mg twice daily-I did not want him to start this before his trip but now that he has 6 weeks in between trips that he gets the perfect time to start.  Went over side effects and risks together -I am more than happy to prescribe and he will direct refill request to me -Discussed could affect blood pressure though with low-dose I think less likely-he will let me know if any significant elevations  #Steroid-induced hyperglycemia S: Medication:Metformin 500 mg twice daily Lab Results  Component Value Date   HGBA1C 6.5 12/29/2021   HGBA1C 6.2 (A) 10/11/2021   HGBA1C 6.7 (H) 02/17/2021    A/P: Check at [facility] 2 days ago and well-controlled-continue to monitor and consider urine microalbumin to creatinine ratio next visit  #Pancreatic lesion-stable with [facility] recently and they continue to monitor  #hypertension S: medication: metoprolol 25 mg BID,  amlodipine 10 mg Home readings #s: States typically even better than in the office today BP Readings from Last 3 Encounters:  01/08/22 130/68  10/11/21 110/68  07/20/21 108/68  A/P:  Controlled. Continue current medications.    #hyperlipidemia with  history of stroke S: Medication:Rosuvastatin 40 mg, aspirin 325 mg Lab Results  Component Value Date   CHOL 132 08/30/2021   HDL 50 08/30/2021   LDLCALC 48 08/30/2021   LDLDIRECT 77 02/17/2020   TRIG 172 (A) 08/30/2021   CHOLHDL 2.9 08/22/2020   A/P: LDL at ideal goal with history of stroke-continue current medications   #GVHD- still on singulair and flovent    #Erectile dysfunction-oncology was okay with him trying erectile dysfunction medication-prior sildenafil 20 mg Rx was expensive but he found 100 mg Rx at [facility] for $20 for 30 pills-prescription was sent to this location  Recommended follow up: Return for next already scheduled visit or sooner if needed. Future Appointments  Date Time Provider [place]  04/12/2022  9:00 AM [name], MD [facility]  01/07/2023  8:45 AM [facility] HEALTH COACH [facility]   Lab/Order associations:   ICD-10-CM   1. Steroid-induced hyperglycemia  R73.9 CANCELED: Microalbumin / creatinine urine ratio   T38.0X5A     2. Adrenal insufficiency (HCC)  E27.40     3. Hyperlipidemia, unspecified hyperlipidemia type  E78.5     4. Essential hypertension  I10     5. Erectile dysfunction, unspecified erectile dysfunction type  N52.9       Meds ordered this encounter  Medications   sildenafil (VIAGRA) 100 MG tablet    Sig: Take 1 tablet (100 mg total) by mouth daily as needed for erectile dysfunction.    Dispense:  30 tablet    Refill:  3    Return precautions advised.  Garret Reddish, MD

## 2022-01-09 ENCOUNTER — Encounter: Payer: Self-pay | Admitting: Family Medicine

## 2022-02-12 ENCOUNTER — Ambulatory Visit: Payer: Medicare Other | Admitting: Family

## 2022-02-12 ENCOUNTER — Encounter (HOSPITAL_COMMUNITY): Payer: Self-pay

## 2022-02-12 ENCOUNTER — Other Ambulatory Visit: Payer: Self-pay

## 2022-02-12 ENCOUNTER — Telehealth: Payer: Self-pay | Admitting: Family Medicine

## 2022-02-12 ENCOUNTER — Emergency Department (HOSPITAL_BASED_OUTPATIENT_CLINIC_OR_DEPARTMENT_OTHER): Payer: Medicare Other | Admitting: Radiology

## 2022-02-12 ENCOUNTER — Emergency Department (HOSPITAL_BASED_OUTPATIENT_CLINIC_OR_DEPARTMENT_OTHER): Payer: Medicare Other

## 2022-02-12 ENCOUNTER — Observation Stay (HOSPITAL_BASED_OUTPATIENT_CLINIC_OR_DEPARTMENT_OTHER)
Admission: EM | Admit: 2022-02-12 | Discharge: 2022-02-13 | Disposition: A | Discharge status: Home / Self Care | Payer: Medicare Other | Attending: Internal Medicine | Admitting: Internal Medicine

## 2022-02-12 ENCOUNTER — Encounter: Payer: Self-pay | Admitting: Family Medicine

## 2022-02-12 DIAGNOSIS — N179 Acute kidney failure, unspecified: Secondary | ICD-10-CM | POA: Insufficient documentation

## 2022-02-12 DIAGNOSIS — Z85828 Personal history of other malignant neoplasm of skin: Secondary | ICD-10-CM | POA: Diagnosis not present

## 2022-02-12 DIAGNOSIS — K219 Gastro-esophageal reflux disease without esophagitis: Secondary | ICD-10-CM | POA: Diagnosis present

## 2022-02-12 DIAGNOSIS — E785 Hyperlipidemia, unspecified: Secondary | ICD-10-CM | POA: Diagnosis present

## 2022-02-12 DIAGNOSIS — Z79899 Other long term (current) drug therapy: Secondary | ICD-10-CM | POA: Insufficient documentation

## 2022-02-12 DIAGNOSIS — Z7984 Long term (current) use of oral hypoglycemic drugs: Secondary | ICD-10-CM | POA: Diagnosis not present

## 2022-02-12 DIAGNOSIS — R002 Palpitations: Secondary | ICD-10-CM | POA: Diagnosis not present

## 2022-02-12 DIAGNOSIS — E2740 Unspecified adrenocortical insufficiency: Secondary | ICD-10-CM | POA: Diagnosis present

## 2022-02-12 DIAGNOSIS — I4891 Unspecified atrial fibrillation: Secondary | ICD-10-CM | POA: Diagnosis not present

## 2022-02-12 DIAGNOSIS — D72829 Elevated white blood cell count, unspecified: Secondary | ICD-10-CM | POA: Insufficient documentation

## 2022-02-12 DIAGNOSIS — E099 Drug or chemical induced diabetes mellitus without complications: Secondary | ICD-10-CM | POA: Insufficient documentation

## 2022-02-12 DIAGNOSIS — Z7982 Long term (current) use of aspirin: Secondary | ICD-10-CM | POA: Diagnosis not present

## 2022-02-12 DIAGNOSIS — Z9481 Bone marrow transplant status: Secondary | ICD-10-CM | POA: Insufficient documentation

## 2022-02-12 DIAGNOSIS — Z8673 Personal history of transient ischemic attack (TIA), and cerebral infarction without residual deficits: Secondary | ICD-10-CM | POA: Insufficient documentation

## 2022-02-12 DIAGNOSIS — I10 Essential (primary) hypertension: Secondary | ICD-10-CM | POA: Diagnosis not present

## 2022-02-12 DIAGNOSIS — Z862 Personal history of diseases of the blood and blood-forming organs and certain disorders involving the immune mechanism: Secondary | ICD-10-CM

## 2022-02-12 LAB — DIFFERENTIAL
Abs Immature Granulocytes: 0.08 10*3/uL — ABNORMAL HIGH (ref 0.00–0.07)
Basophils Absolute: 0.1 10*3/uL (ref 0.0–0.1)
Basophils Relative: 0 %
Eosinophils Absolute: 0.1 10*3/uL (ref 0.0–0.5)
Eosinophils Relative: 1 %
Immature Granulocytes: 0 %
Lymphocytes Relative: 47 %
Lymphs Abs: 9.3 10*3/uL — ABNORMAL HIGH (ref 0.7–4.0)
Monocytes Absolute: 2.1 10*3/uL — ABNORMAL HIGH (ref 0.1–1.0)
Monocytes Relative: 11 %
Neutro Abs: 8 10*3/uL — ABNORMAL HIGH (ref 1.7–7.7)
Neutrophils Relative %: 41 %

## 2022-02-12 LAB — CBC
HCT: 49.4 % (ref 39.0–52.0)
Hemoglobin: 16.5 g/dL (ref 13.0–17.0)
MCH: 30.1 pg (ref 26.0–34.0)
MCHC: 33.4 g/dL (ref 30.0–36.0)
MCV: 90 fL (ref 80.0–100.0)
Platelets: 400 10*3/uL (ref 150–400)
RBC: 5.49 MIL/uL (ref 4.22–5.81)
RDW: 17.8 % — ABNORMAL HIGH (ref 11.5–15.5)
WBC: 19.7 10*3/uL — ABNORMAL HIGH (ref 4.0–10.5)
nRBC: 0 % (ref 0.0–0.2)

## 2022-02-12 LAB — HEPATIC FUNCTION PANEL
ALT: 36 U/L (ref 0–44)
AST: 27 U/L (ref 15–41)
Albumin: 4.2 g/dL (ref 3.5–5.0)
Alkaline Phosphatase: 84 U/L (ref 38–126)
Bilirubin, Direct: 0.2 mg/dL (ref 0.0–0.2)
Indirect Bilirubin: 0.5 mg/dL (ref 0.3–0.9)
Total Bilirubin: 0.7 mg/dL (ref 0.3–1.2)
Total Protein: 8.1 g/dL (ref 6.5–8.1)

## 2022-02-12 LAB — GLUCOSE, CAPILLARY: Glucose-Capillary: 167 mg/dL — ABNORMAL HIGH (ref 70–99)

## 2022-02-12 LAB — BASIC METABOLIC PANEL
Anion gap: 14 (ref 5–15)
BUN: 16 mg/dL (ref 8–23)
CO2: 24 mmol/L (ref 22–32)
Calcium: 9.8 mg/dL (ref 8.9–10.3)
Chloride: 100 mmol/L (ref 98–111)
Creatinine, Ser: 1.32 mg/dL — ABNORMAL HIGH (ref 0.61–1.24)
GFR, Estimated: 57 mL/min — ABNORMAL LOW (ref >60)
Glucose, Bld: 165 mg/dL — ABNORMAL HIGH (ref 70–99)
Potassium: 4.3 mmol/L (ref 3.5–5.1)
Sodium: 138 mmol/L (ref 135–145)

## 2022-02-12 LAB — MAGNESIUM: Magnesium: 2.1 mg/dL (ref 1.7–2.4)

## 2022-02-12 LAB — PROTIME-INR
INR: 1 (ref 0.8–1.2)
Prothrombin Time: 13.2 seconds (ref 11.4–15.2)

## 2022-02-12 LAB — TSH: TSH: 3.398 u[IU]/mL (ref 0.350–4.500)

## 2022-02-12 LAB — TROPONIN I (HIGH SENSITIVITY): Troponin I (High Sensitivity): 10 ng/L (ref <18)

## 2022-02-12 MED ORDER — ACETAMINOPHEN 650 MG RE SUPP: 650.0000 mg | Freq: Four times a day (QID) | RECTAL | Status: DC | PRN

## 2022-02-12 MED ORDER — SENNOSIDES-DOCUSATE SODIUM 8.6-50 MG PO TABS
2.0000 | ORAL_TABLET | Freq: Every day | ORAL | Status: DC
  Administered 2022-02-12: 2 via ORAL
  Filled 2022-02-12: qty 2

## 2022-02-12 MED ORDER — ACETAMINOPHEN 325 MG PO TABS: 650.0000 mg | ORAL_TABLET | Freq: Four times a day (QID) | ORAL | Status: DC | PRN

## 2022-02-12 MED ORDER — DILTIAZEM HCL-DEXTROSE 125-5 MG/125ML-% IV SOLN (PREMIX)
5.0000 mg/h | INTRAVENOUS | Status: DC
  Administered 2022-02-12: 5 mg/h via INTRAVENOUS
  Filled 2022-02-12 (×2): qty 125

## 2022-02-12 MED ORDER — MONTELUKAST SODIUM 10 MG PO TABS
10.0000 mg | ORAL_TABLET | Freq: Every day | ORAL | Status: DC
  Administered 2022-02-12 – 2022-02-13 (×2): 10 mg via ORAL
  Filled 2022-02-12 (×2): qty 1

## 2022-02-12 MED ORDER — INSULIN ASPART 100 UNIT/ML IJ SOLN
0.0000 [IU] | Freq: Three times a day (TID) | INTRAMUSCULAR | Status: DC
  Administered 2022-02-13: 1 [IU] via SUBCUTANEOUS

## 2022-02-12 MED ORDER — HYDROCORTISONE 10 MG PO TABS
10.0000 mg | ORAL_TABLET | Freq: Every day | ORAL | Status: DC
  Filled 2022-02-12 (×2): qty 1

## 2022-02-12 MED ORDER — LEVALBUTEROL HCL 0.63 MG/3ML IN NEBU: 0.6300 mg | INHALATION_SOLUTION | Freq: Four times a day (QID) | RESPIRATORY_TRACT | Status: DC | PRN

## 2022-02-12 MED ORDER — PANTOPRAZOLE SODIUM 40 MG PO TBEC
40.0000 mg | DELAYED_RELEASE_TABLET | Freq: Every day | ORAL | Status: DC
  Administered 2022-02-13: 40 mg via ORAL
  Filled 2022-02-12: qty 1

## 2022-02-12 MED ORDER — POLYETHYLENE GLYCOL 3350 17 G PO PACK: 1.0000 | PACK | Freq: Every day | ORAL | Status: DC | PRN

## 2022-02-12 MED ORDER — ACYCLOVIR 400 MG PO TABS
400.0000 mg | ORAL_TABLET | Freq: Two times a day (BID) | ORAL | Status: DC
  Administered 2022-02-12 – 2022-02-13 (×2): 400 mg via ORAL
  Filled 2022-02-12 (×3): qty 1

## 2022-02-12 MED ORDER — SODIUM CHLORIDE 0.9 % IV BOLUS
500.0000 mL | Freq: Once | INTRAVENOUS | Status: AC
  Administered 2022-02-12: 500 mL via INTRAVENOUS

## 2022-02-12 MED ORDER — ROSUVASTATIN CALCIUM 20 MG PO TABS
40.0000 mg | ORAL_TABLET | Freq: Every day | ORAL | Status: DC
  Administered 2022-02-12 – 2022-02-13 (×2): 40 mg via ORAL
  Filled 2022-02-12 (×2): qty 2

## 2022-02-12 MED ORDER — LORATADINE 10 MG PO TABS
10.0000 mg | ORAL_TABLET | Freq: Every day | ORAL | Status: DC
  Administered 2022-02-13: 10 mg via ORAL
  Filled 2022-02-12: qty 1

## 2022-02-12 MED ORDER — LEVALBUTEROL TARTRATE 45 MCG/ACT IN AERO: 1.0000 | INHALATION_SPRAY | Freq: Four times a day (QID) | RESPIRATORY_TRACT | Status: DC | PRN

## 2022-02-12 MED ORDER — METOPROLOL TARTRATE 25 MG PO TABS
25.0000 mg | ORAL_TABLET | Freq: Two times a day (BID) | ORAL | Status: DC
  Administered 2022-02-12 – 2022-02-13 (×2): 25 mg via ORAL
  Filled 2022-02-12 (×2): qty 1

## 2022-02-12 MED ORDER — BUDESONIDE 0.25 MG/2ML IN SUSP
0.2500 mg | Freq: Two times a day (BID) | RESPIRATORY_TRACT | Status: DC
  Administered 2022-02-12 – 2022-02-13 (×2): 0.25 mg via RESPIRATORY_TRACT
  Filled 2022-02-12 (×2): qty 2

## 2022-02-12 MED ORDER — DILTIAZEM HCL 25 MG/5ML IV SOLN
10.0000 mg | Freq: Once | INTRAVENOUS | Status: AC
  Administered 2022-02-12: 10 mg via INTRAVENOUS
  Filled 2022-02-12: qty 5

## 2022-02-12 MED ORDER — POLYVINYL ALCOHOL 1.4 % OP SOLN: 1.0000 [drp] | OPHTHALMIC | Status: DC | PRN

## 2022-02-12 MED ORDER — HYDROCORTISONE 10 MG PO TABS
15.0000 mg | ORAL_TABLET | Freq: Every day | ORAL | Status: DC
  Administered 2022-02-13: 15 mg via ORAL
  Filled 2022-02-12: qty 1.5

## 2022-02-12 MED ORDER — ONDANSETRON HCL 4 MG/2ML IJ SOLN: 4.0000 mg | Freq: Four times a day (QID) | INTRAMUSCULAR | Status: DC | PRN

## 2022-02-12 MED ORDER — VENLAFAXINE HCL 25 MG PO TABS
25.0000 mg | ORAL_TABLET | Freq: Two times a day (BID) | ORAL | Status: DC
  Administered 2022-02-12 – 2022-02-13 (×2): 25 mg via ORAL
  Filled 2022-02-12 (×3): qty 1

## 2022-02-12 MED ORDER — APIXABAN 5 MG PO TABS
5.0000 mg | ORAL_TABLET | Freq: Two times a day (BID) | ORAL | Status: DC
  Administered 2022-02-12 – 2022-02-13 (×2): 5 mg via ORAL
  Filled 2022-02-12 (×2): qty 1

## 2022-02-12 NOTE — Assessment & Plan Note (Signed)
Stop ASA, start eliquis History of CVA in 2020 and appeared to have prior cerebellar CVA Continue crestor

## 2022-02-12 NOTE — ED Provider Notes (Addendum)
[facility] EMERGENCY DEPT Provider Note   CSN: [id number] Arrival date & time: 02/12/22  0907     History  Chief Complaint  Patient presents with   Palpitations    Brandon [name] is a 71 y.o. male.  Patient with known known history of rapid heart rate problems.  Patient stated that yesterday at about 3 in the afternoon his watch alerted him that his heart rate was fast.  But does not know what rhythm.  No chest pain or chest discomfort no lightheadedness or feeling like he is going to pass out.  No shortness of breath.  No leg swelling.  Past medical history significant for hyperlipidemia hypertension status post bone marrow transplant adrenocortical insufficiency.  And myelodysplastic syndrome.  Patient never smoked.  Patient also reports a TIA many years ago without any exact causes for it.       Home Medications Prior to Admission medications   Medication Sig Start Date End Date Taking? Authorizing Provider  acyclovir (ZOVIRAX) 400 MG tablet Take 400 mg by mouth 2 (two) times daily.    [provider]  albuterol (VENTOLIN HFA) 108 (90 Base) MCG/ACT inhaler Inhale into the lungs in the morning and at bedtime.    [provider]  Alpha-Lipoic Acid 600 MG CAPS Take by mouth.    [provider]  amLODipine (NORVASC) 10 MG tablet TAKE 1 TABLET BY MOUTH EVERY DAY 08/08/21   [name], MD  amoxicillin (AMOXIL) 500 MG capsule  12/05/20   [provider]  aspirin 325 MG EC tablet Take 325 mg by mouth daily.    [provider]  Cholecalciferol (VITAMIN D) 50 MCG (2000 UT) CAPS Take 2,000 Units by mouth at bedtime.     [provider]  famotidine (PEPCID) 20 MG tablet TAKE 1 TABLET BY MOUTH EVERY DAY AT NIGHT 04/28/20   [name], MD  fluticasone (FLOVENT HFA) 220 MCG/ACT inhaler Inhale 1 puff into the lungs 2 (two) times daily.    [provider]  hydrocortisone (CORTEF) 10 MG tablet Take 10-15  mg by mouth 2 (two) times daily. As of 01/08/22- 15 mg in AM and 10 mg in PM    [provider]  Lancets (ONETOUCH DELICA PLUS [id number]) [place]  07/14/18   [provider]  loratadine (CLARITIN) 10 MG tablet Take 10 mg by mouth daily.    [provider]  Magnesium Oxide 400 MG CAPS Take 1 capsule (400 mg total) by mouth 5 (five) times daily. Patient taking differently: Take 400 mg by mouth daily. 09/03/16   Nicholas Lose, MD  metFORMIN (GLUCOPHAGE) 500 MG tablet Take 500 mg by mouth 2 (two) times daily. 07/20/20   [provider]  metoprolol tartrate (LOPRESSOR) 25 MG tablet Take 1 tablet by mouth 2 (two) times daily. 01/20/19   [provider]  montelukast (SINGULAIR) 10 MG tablet Take 10 mg by mouth daily.     [provider]  Multiple Vitamin (MULTI-VITAMIN) tablet Take by mouth.    [provider]  niacinamide 500 MG tablet Take 500 mg by mouth 2 (two) times daily with a meal.    [provider]  omeprazole (PRILOSEC) 10 MG capsule TAKE 1 CAPSULE BY MOUTH EVERY DAY Patient taking differently: 20 mg. 05/12/20   [name], MD  ondansetron (ZOFRAN-ODT) 8 MG disintegrating tablet Take 8 mg by mouth every 8 (eight) hours as needed for nausea or vomiting.    [provider]  polyethylene glycol (MIRALAX / GLYCOLAX) 17 g packet Take 1 packet by mouth daily as needed (constipation).     [provider]  polyvinyl alcohol (LIQUIFILM TEARS) 1.4 % ophthalmic solution Place 1 drop into both eyes as needed for dry eyes.    [provider]  rosuvastatin (CRESTOR) 40 MG tablet TAKE 1 TABLET BY MOUTH EVERY DAY 02/13/21   [name], MD  senna-docusate (SENOKOT-S) 8.6-50 MG tablet Take 2 tablets by mouth at bedtime.     [provider]  sildenafil (VIAGRA) 100 MG tablet Take 1 tablet (100 mg total) by mouth daily as needed for erectile dysfunction. 01/08/22   [name], MD      Allergies     Levofloxacin and Tape    Review of Systems   Review of Systems  Constitutional:  Negative for chills and fever.  HENT:  Negative for congestion, ear pain and sore throat.   Eyes:  Negative for pain and visual disturbance.  Respiratory:  Negative for cough and shortness of breath.   Cardiovascular:  Positive for palpitations. Negative for chest pain and leg swelling.  Gastrointestinal:  Negative for abdominal pain and vomiting.  Genitourinary:  Negative for dysuria and hematuria.  Musculoskeletal:  Negative for arthralgias and back pain.  Skin:  Negative for color change and rash.  Neurological:  Negative for seizures and syncope.  All other systems reviewed and are negative.   Physical Exam Updated Vital Signs BP (!) 120/106 (BP Location: Right Arm)   Pulse (!) 183   Temp 98 F (36.7 C)   Resp 18   Ht 1.778 m ('5\' 10"'$ )   Wt 103 kg   SpO2 98%   BMI 32.58 kg/m  Physical Exam Vitals and nursing note reviewed.  Constitutional:      General: He is not in acute distress.    Appearance: Normal appearance. He is well-developed. He is not ill-appearing or toxic-appearing.  HENT:     Head: Normocephalic and atraumatic.  Eyes:     Extraocular Movements: Extraocular movements intact.     Conjunctiva/sclera: Conjunctivae normal.     Pupils: Pupils are equal, round, and reactive to light.  Cardiovascular:     Rate and Rhythm: Tachycardia present. Rhythm irregular.     Heart sounds: No murmur heard. Pulmonary:     Effort: Pulmonary effort is normal. No respiratory distress.     Breath sounds: Normal breath sounds.  Abdominal:     Palpations: Abdomen is soft.     Tenderness: There is no abdominal tenderness.  Musculoskeletal:        General: No swelling.     Cervical back: Neck supple.     Right lower leg: No edema.     Left lower leg: No edema.  Skin:    General: Skin is warm and dry.     Capillary Refill: Capillary refill takes less than 2 seconds.  Neurological:      General: No focal deficit present.     Mental Status: He is alert and oriented to person, place, and time.  Psychiatric:        Mood and Affect: Mood normal.     ED Results / Procedures / Treatments   Labs (all labs ordered are listed, but only abnormal results are displayed) Labs Reviewed  CBC - Abnormal; Notable for the following components:      Result Value   WBC 19.7 (*)    RDW 17.8 (*)  All other components within normal limits  BASIC METABOLIC PANEL  DIFFERENTIAL  HEPATIC FUNCTION PANEL    EKG EKG Interpretation  Date/Time:  Monday February 12 2022 09:37:28 EDT Ventricular Rate:  183 PR Interval:    QRS Duration: 64 QT Interval:  244 QTC Calculation: 425 R Axis:   67 Text Interpretation: Atrial fibrillation with rapid ventricular response Low voltage QRS ST & T wave abnormality, consider inferolateral ischemia Abnormal ECG When compared with ECG of 03-Sep-2019 20:05, PREVIOUS ECG IS PRESENT New since previous tracing Confirmed by Fredia Sorrow [phone]) on 02/12/2022 9:47:39 AM  Radiology DG Chest Port 1 View  Result Date: 02/12/2022 CLINICAL DATA:  71 year old male presenting for palpitations. EXAM: PORTABLE CHEST 1 VIEW COMPARISON:  September 09, 2019 FINDINGS: Trachea midline. Cardiomediastinal contours and hilar structures are normal. Lungs are clear. No effusion. No consolidative changes. No pneumothorax. On limited assessment no acute skeletal process. IMPRESSION: No acute cardiopulmonary disease. Electronically Signed   By: Zetta Bills M.D.   On: 02/12/2022 10:11    Procedures Procedures    Medications Ordered in ED Medications  diltiazem (CARDIZEM) 125 mg in dextrose 5% 125 mL (1 mg/mL) infusion (5 mg/hr Intravenous New Bag/Given 02/12/22 1017)  diltiazem (CARDIZEM) injection 10 mg (10 mg Intravenous Given 02/12/22 1017)    ED Course/ Medical Decision Making/ A&P                           Medical Decision Making Amount and/or Complexity of Data  Reviewed Labs: ordered. Radiology: ordered.  Risk Prescription drug management. Decision regarding hospitalization.   Patient completely asymptomatic other than the rapid heart rate.  Ranging anywhere from 1 60-1 90.  Consistent with atrial fibrillation.  By history may have started yesterday at 3.  Also by history may have converted at home at times heart rate had been recorded around 88 at times.  Patient not on any blood thinners.  We will go ahead and give 10 mg of diltiazem and start diltiazem drip.  CBC white count is 19,000 we will add on a differential because of his past history.  Platelets are at 400.  Chest x-ray no acute findings.  Other labs still pending.  Patient ultimately will probably need admission.  CRITICAL CARE Performed by: Fredia Sorrow Total critical care time: 45 minutes Critical care time was exclusive of separately billable procedures and treating other patients. Critical care was necessary to treat or prevent imminent or life-threatening deterioration. Critical care was time spent personally by me on the following activities: development of treatment plan with patient and/or surrogate as well as nursing, discussions with consultants, evaluation of patient's response to treatment, examination of patient, obtaining history from patient or surrogate, ordering and performing treatments and interventions, ordering and review of laboratory studies, ordering and review of radiographic studies, pulse oximetry and re-evaluation of patient's condition.  Patient responding to the titration of the diltiazem.  The bolus did not have much effect.  Heart rate now around 150.  We will continue to titrate.  Patient's CBC has an elevated white blood cell count.  Differential is pending.  He has a history of myelodysplastic disease.  Basic metabolic panel without significant abnormalities.  Has some mild renal insufficiency creatinine is 1.32 GFR is 57.  Patient's hemoglobin  normal at 16.5 platelets are normal at 500.  And patient's hepatic function is normal.  We will contact hospitalist for admission    Final Clinical Impression(s) / ED  Diagnoses Final diagnoses:  Atrial fibrillation with RVR [facility])    Rx / DC Orders ED Discharge Orders     None         Fredia Sorrow, MD 02/12/22 1018    Fredia Sorrow, MD 02/12/22 1019    Fredia Sorrow, MD 02/12/22 [phone]

## 2022-02-12 NOTE — Assessment & Plan Note (Signed)
71 year old presenting to ED with new onset atrial fibrillation with RVR with rates >180. He was asymptomatic and came in due to his apple watch prompting elevated heart rate. Does have history of 2 prior CVAs and is on ASA '325mg'$ .  -admit to progressive -given '10mg'$  bolus of cardizem in ED and started on cardizem gtt -he has converted to NSR with rates upper 60s -will stop drip and start him back on his metoprolol. He takes '25mg'$  BID and this can likely be titrated up tomorrow when pressures allow.   -CHA2DS2-Vasc of 5. Discussed risks and benefits of AC. Will start him on eliquis '5mg'$  BID, stop ASA.  -echo  -infectious w/u -check TSH/mag/troponin. -has cardiologist in [place] system

## 2022-02-12 NOTE — ED Triage Notes (Addendum)
Patient arrives ambulatory to triage with complaints of elevated heart rate. Patient states that his watch indicated that his heart rate was in the 170's (atrial fib) x1 day. Patient reports no heart issues in the past.   Patient reports mild stroke 3 years ago. Also had a stem cell transplant 03/2016 and adrenal insufficiency.  Started Effexor one month ago and started using pulmicort inhaler.  Also on chronic steroids.

## 2022-02-12 NOTE — H&P (Signed)
History and Physical    Patient: Brandon [name] QQP:[id number] DOB: [birth year] DOA: 02/12/2022 DOS: the patient was seen and examined on 02/12/2022 PCP: [name], MD  Patient coming from:  [place]  - lives with his wife and godson. Ambulates independently.    Chief Complaint: fast heart rate   HPI: Brandon [name] is a 71 y.o. male with medical history significant of  myelodysplastic syndrome s/p bone marrow transplant; adrenal insufficiency on chronic steroids; TIA; HTN; and HLD presenting with palpitations. He states around 3pm yesterday his apple watch went off and said potential atrial fibrillation and high heart rate. He was asymptomatic. Around 10pm his heart rate went to 88bpm. He thought he may have had to much coffee so he waited until morning. Around 6AM his watch went off again and heart rate went back up. He called his PCP and recommended he go to ED. He denies any recent illness. He had covid in mid June, but has not been ill recently. He was recently started on effexor about one month ago.   He has been feeling good. Denies any fever/chills, vision changes/headaches, chest pain or palpitations, shortness of breath or cough, abdominal pain, N/V/D, dysuria or leg swelling.   He does not smoke or drink.   ER Course:  vitals: afebrile, bp: 120/106, HR: 183, RR: 18, oxygen: 98%RA Pertinent labs: wbc: 19.7, creatinine 1.32,  CXR: no acute finding  In ED: given '10mg'$  cardizem and started on cardizem gtt. [facility] asked to admit.    Review of Systems: As mentioned in the history of present illness. All other systems reviewed and are negative. Past Medical History:  Diagnosis Date   Adrenocortical insufficiency ([place]) 08/06/2017   Patient with 2 hospitalizations in January 2019- treated with fluids, antibiotics, steroids- ultimately thought most likely due to adrenal insufficiency with no obvious source of infection found   Allergy    seasonal/animals   Chronic prostatitis 12/17/2008    Diverticulosis 01/16/2011   History of diverticulitis    ERECTILE DYSFUNCTION 02/07/2007   HYPERLIPIDEMIA 02/04/2007   HYPERTENSION 02/04/2007   NEOPLASM, MALIGNANT, SKIN, BACK 02/09/2009   Pneumonia, viral 12/2017   S/P bone marrow transplant [facility])    Past Surgical History:  Procedure Laterality Date   CATARACT EXTRACTION Left 06/2018   CATARACT EXTRACTION Right 10/08/2019   none     SCALP LESION REMOVAL W/ FLAP AND SKIN GRAFT Left    Social History:  reports that he has never smoked. He has never used smokeless tobacco. He reports current alcohol use of about 3.0 standard drinks of alcohol per week. He reports that he does not use drugs.  Allergies  Allergen Reactions   Levofloxacin Other (See Comments)    Muscle pain    Tape Other (See Comments)    Skin irritation that may cause a scab if left on too long    Family History  Problem Relation Age of Onset   Breast cancer Mother    CVA Mother        quit taking HTN meds   Alzheimer's disease Father        late 78s. mid 37s   Asperger's syndrome Son     Prior to Admission medications   Medication Sig Start Date End Date Taking? Authorizing Provider  acyclovir (ZOVIRAX) 400 MG tablet Take 400 mg by mouth 2 (two) times daily.    [provider]  albuterol (VENTOLIN HFA) 108 (90 Base) MCG/ACT inhaler Inhale into the lungs in the  morning and at bedtime.    [provider]  Alpha-Lipoic Acid 600 MG CAPS Take by mouth.    [provider]  amLODipine (NORVASC) 10 MG tablet TAKE 1 TABLET BY MOUTH EVERY DAY 08/08/21   [name], MD  amoxicillin (AMOXIL) 500 MG capsule  12/05/20   [provider]  aspirin 325 MG EC tablet Take 325 mg by mouth daily.    [provider]  Cholecalciferol (VITAMIN D) 50 MCG (2000 UT) CAPS Take 2,000 Units by mouth at bedtime.     [provider]  famotidine (PEPCID) 20 MG tablet TAKE 1 TABLET BY MOUTH EVERY DAY AT NIGHT 04/28/20   [name],  MD  fluticasone (FLOVENT HFA) 220 MCG/ACT inhaler Inhale 1 puff into the lungs 2 (two) times daily.    [provider]  hydrocortisone (CORTEF) 10 MG tablet Take 10-15 mg by mouth 2 (two) times daily. As of 01/08/22- 15 mg in AM and 10 mg in PM    [provider]  Lancets (ONETOUCH DELICA PLUS [id number]) [place]  07/14/18   [provider]  loratadine (CLARITIN) 10 MG tablet Take 10 mg by mouth daily.    [provider]  Magnesium Oxide 400 MG CAPS Take 1 capsule (400 mg total) by mouth 5 (five) times daily. Patient taking differently: Take 400 mg by mouth daily. 09/03/16   Nicholas Lose, MD  metFORMIN (GLUCOPHAGE) 500 MG tablet Take 500 mg by mouth 2 (two) times daily. 07/20/20   [provider]  metoprolol tartrate (LOPRESSOR) 25 MG tablet Take 1 tablet by mouth 2 (two) times daily. 01/20/19   [provider]  montelukast (SINGULAIR) 10 MG tablet Take 10 mg by mouth daily.     [provider]  Multiple Vitamin (MULTI-VITAMIN) tablet Take by mouth.    [provider]  niacinamide 500 MG tablet Take 500 mg by mouth 2 (two) times daily with a meal.    [provider]  omeprazole (PRILOSEC) 10 MG capsule TAKE 1 CAPSULE BY MOUTH EVERY DAY Patient taking differently: 20 mg. 05/12/20   [name], MD  ondansetron (ZOFRAN-ODT) 8 MG disintegrating tablet Take 8 mg by mouth every 8 (eight) hours as needed for nausea or vomiting.    [provider]  polyethylene glycol (MIRALAX / GLYCOLAX) 17 g packet Take 1 packet by mouth daily as needed (constipation).     [provider]  polyvinyl alcohol (LIQUIFILM TEARS) 1.4 % ophthalmic solution Place 1 drop into both eyes as needed for dry eyes.    [provider]  PULMICORT FLEXHALER 180 MCG/ACT inhaler Inhale 1 puff into the lungs 2 (two) times daily. 01/12/22   [provider]  rosuvastatin (CRESTOR) 40 MG tablet TAKE 1 TABLET BY MOUTH EVERY DAY  02/13/21   [name], MD  senna-docusate (SENOKOT-S) 8.6-50 MG tablet Take 2 tablets by mouth at bedtime.     [provider]  sildenafil (VIAGRA) 100 MG tablet Take 1 tablet (100 mg total) by mouth daily as needed for erectile dysfunction. 01/08/22   [name], MD  venlafaxine (EFFEXOR) 25 MG tablet Take 25 mg by mouth 2 (two) times daily. 01/11/22   [provider]    Physical Exam: Vitals:   02/12/22 1346 02/12/22 1430 02/12/22 1615 02/12/22 1801  BP: 96/67 110/68 121/76 113/76  Pulse: (!) 120 94  62  Resp: '14 15 20 20  '$ Temp: 98 F (36.7 C)  97.7 F (36.5 C)  TempSrc: Oral   Oral  SpO2: 95% 92% 95% 95%  Weight:      Height:       General:  Appears calm and comfortable and is in NAD Eyes:  PERRL, EOMI, normal lids, iris ENT:  grossly normal hearing, lips & tongue, mmm; appropriate dentition Neck:  no LAD, masses or thyromegaly; no carotid bruits, no JVD Cardiovascular:  RRR, no m/r/g. No LE edema.  Respiratory:   CTA bilaterally with no wheezes/rales/rhonchi.  Normal respiratory effort. Abdomen:  soft, NT, ND, NABS Back:   normal alignment, no CVAT Skin:  no rash or induration seen on limited exam Musculoskeletal:  grossly normal tone BUE/BLE, good ROM, no bony abnormality Lower extremity:  No LE edema.  Limited foot exam with no ulcerations.  2+ distal pulses. Psychiatric:  grossly normal mood and affect, speech fluent and appropriate, AOx3 Neurologic:  CN 2-12 grossly intact, moves all extremities in coordinated fashion, sensation intact   Radiological Exams on Admission: Independently reviewed - see discussion in A/P where applicable  DG Chest Port 1 View  Result Date: 02/12/2022 CLINICAL DATA:  71 year old male presenting for palpitations. EXAM: PORTABLE CHEST 1 VIEW COMPARISON:  September 09, 2019 FINDINGS: Trachea midline. Cardiomediastinal contours and hilar structures are normal. Lungs are clear. No effusion. No consolidative changes. No  pneumothorax. On limited assessment no acute skeletal process. IMPRESSION: No acute cardiopulmonary disease. Electronically Signed   By: Zetta Bills M.D.   On: 02/12/2022 10:11    EKG: Independently reviewed.  Atrial fibrillation with rate 183; nonspecific ST changes with no evidence of acute ischemia New afib   Labs on Admission: I have personally reviewed the available labs and imaging studies at the time of the admission.  Pertinent labs:   wbc: 19.7,  creatinine 1.32,   Assessment and Plan: Principal Problem:   New onset atrial fibrillation with RVR  Active Problems:   AKI (acute kidney injury) (HCC)   Leukocytosis   Essential hypertension   Adrenal insufficiency (HCC)   History of myelodysplastic syndrome s/p stem cell transplant in 2017    Steroid-induced diabetes ([place])   History of CVA (cerebrovascular accident)   Hyperlipidemia   GERD (gastroesophageal reflux disease)    Assessment and Plan: * New onset atrial fibrillation with RVR  71 year old presenting to ED with new onset atrial fibrillation with RVR with rates >180. He was asymptomatic and came in due to his apple watch prompting elevated heart rate. Does have history of 2 prior CVAs and is on ASA '325mg'$ .  -admit to progressive -given '10mg'$  bolus of cardizem in ED and started on cardizem gtt -he has converted to NSR with rates upper 60s -will stop drip and start him back on his metoprolol. He takes '25mg'$  BID and this can likely be titrated up tomorrow when pressures allow.   -CHA2DS2-Vasc of 5. Discussed risks and benefits of AC. Will start him on eliquis '5mg'$  BID, stop ASA.  -echo  -infectious w/u -check TSH/mag/troponin. -has cardiologist in [place] system   AKI (acute kidney injury) ([place]) Baseline creatinine around 1.0.  Likely secondary to hypoperfusion from atrial fib with RVR  Converted back to NSR Will give small 500cc bolus Strict I/O Avoid nephrotoxic drugs Trend   Leukocytosis Denies any infectious  symptoms, but immunosuppressed so may not mount a fever Would expect some leukocytosis with his chronic steroids, but this is more elevated than his baseline.  CXR clear, check UA/blood cultures His transplant physician  is also requesting BC be drawn.  Small IVF bolus Trend   Essential hypertension Blood pressures have been on softer side while on cardizem gtt.  Stopping drip-->metoprolol, will continue home dose for now at '25mg'$  BID, but will likely needs this titrated up Hold norvasc   Adrenal insufficiency (HCC) Multiple hospitalizations.  Managed by [facility] endocrinology Dr. [name] Started with patient with 2 hospitalizations in January 2019- treated with fluids, antibiotics, steroids- ultimately thought most likely due to adrenal insufficiency with no obvious source of infection found -continue hydrocortisone  History of myelodysplastic syndrome s/p stem cell transplant in 2017  Followed by [facility], on chronic steroids Leukocytosis, will check UA/blood cultures to make sure no underlying infection as he may not mount a fever with immunosuppression Continue inhalers, change albuterol to xoponex   Steroid-induced diabetes ([place]) Recent A1C 6.5 Hold metformin SSI and accuchecks qac/hs   History of CVA (cerebrovascular accident) Stop ASA, start eliquis History of CVA in 2020 and appeared to have prior cerebellar CVA Continue crestor   Hyperlipidemia Continue crestor   GERD (gastroesophageal reflux disease) Continue PPI      Advance Care Planning:   Code Status: Full Code   Consults: none   DVT Prophylaxis: eliquis   Family Communication: none   Severity of Illness: The appropriate patient status for this patient is INPATIENT. Inpatient status is judged to be reasonable and necessary in order to provide the required intensity of service to ensure the patient's safety. The patient's presenting symptoms, physical exam findings, and initial radiographic and laboratory data in  the context of their chronic comorbidities is felt to place them at high risk for further clinical deterioration. Furthermore, it is not anticipated that the patient will be medically stable for discharge from the hospital within 2 midnights of admission.   * I certify that at the point of admission it is my clinical judgment that the patient will require inpatient hospital care spanning beyond 2 midnights from the point of admission due to high intensity of service, high risk for further deterioration and high frequency of surveillance required.*  Author: Orma Flaming, MD 02/12/2022 7:27 PM  For on call review [email or web address].

## 2022-02-12 NOTE — Assessment & Plan Note (Signed)
Recent A1C 6.5 Hold metformin SSI and accuchecks qac/hs

## 2022-02-12 NOTE — Assessment & Plan Note (Signed)
Blood pressures have been on softer side while on cardizem gtt.  Stopping drip-->metoprolol, will continue home dose for now at '25mg'$  BID, but will likely needs this titrated up Hold norvasc

## 2022-02-12 NOTE — Progress Notes (Signed)
Plan of Care Note for accepted transfer   Patient: Brandon [name] MRN: [id number]   [place]: 02/12/2022  Facility requesting transfer: [facility] Requesting Provider: Rogene Houston Reason for transfer: new-onset afib Facility course: Patient with h/o myelodysplastic syndrome s/p bone marrow transplant; adrenal insufficiency; TIA; HTN; and HLD presenting with palpitations.  He has new-onset afib, likely at 3pm from his watch report.  HR up to 180.  No cardiologist, maybe remote h/o flutter.  On ASA for h/o TIA.  Given Dilt 10 mg and now on titrating drip, HR 150s.  BP is tolerating the drip well.  He felt fine other than his watch notifications.    Plan of care: The patient is accepted for admission to Progressive unit, at [facility].   Author: Karmen Bongo, MD 02/12/2022  Check [email or web address] for on-call coverage.  Nursing staff, Please call [place] number on Amion as soon as patient's arrival, so appropriate admitting provider can evaluate the pt.

## 2022-02-12 NOTE — Assessment & Plan Note (Addendum)
Followed by [facility], on chronic steroids Leukocytosis, will check UA/blood cultures to make sure no underlying infection as he may not mount a fever with immunosuppression Continue inhalers, change albuterol to xoponex

## 2022-02-12 NOTE — Assessment & Plan Note (Signed)
Denies any infectious symptoms, but immunosuppressed so may not mount a fever Would expect some leukocytosis with his chronic steroids, but this is more elevated than his baseline.  CXR clear, check UA/blood cultures His transplant physician is also requesting BC be drawn.  Small IVF bolus Trend

## 2022-02-12 NOTE — Assessment & Plan Note (Signed)
Continue crestor 

## 2022-02-12 NOTE — Telephone Encounter (Signed)
Patient Name: Brandon [name] [facility] Gender: Male DOB: [birth year] Age: 71 Y 6 M 18 D Return Phone Number: [phone] (Primary), [phone] (Secondary) Address: City/ State/ Zip: [place]  [address] Client [place] [facility] [place] [facility] [place] [facility] Provider Garret Reddish- MD Contact Type Call Who Is Calling Patient / Member / Family / Caregiver Call Type Triage / Clinical Caller Name [email or web address] Relationship To Patient Other Return Phone Number [phone] (Primary) Chief Complaint Heart palpitations or irregular heartbeat Reason for Call Symptomatic / Request for [place] patient is experiencing increased heart rate since yesterday his watch detected possible AFIB his heart rate was as high as 190 and as low as 88. [place] ED Translation No Nurse Assessment Nurse: Lovelace, RN, Amy Date/Time (Eastern Time): 02/12/2022 8:30:19 AM Confirm and document reason for call. If symptomatic, describe symptoms. ---Caller states he has experienced increased heart rate since yesterday. His watch detected possible AFib. His heart rate was as high as 190 and as low as 88. When he checked it again after that, it said HR too high. He did not feel a fast HR or pounding in his chest. Maybe a little palpitations. BP was OK yesterday. He did get a little clammy. No chest pain. New med, Effexor 3 weeks ago. He was woken up at 6AM by his watch saying he had a high HR. It is 154 right now. Does the patient have any new or worsening symptoms? ---Yes Will a triage be completed? ---Yes Related visit to physician within the last 2 weeks? ---No Does the PT have any chronic conditions? (i.e. diabetes, asthma, this includes High risk factors for pregnancy, etc.) ---Yes List chronic conditions. ---stem cell transplant 6 yrs ago for MDS, minor CVA 3 yrs ago-ocular &  resolved in 48 hrs., saw cardiologist in Jan, b/c his transplant dr. recommended it-everything was ok at that time PLEASE NOTE: All timestamps contained within this report are represented as [place] Standard Time. CONFIDENTIALTY NOTICE: This fax transmission is intended only for the addressee. It contains information that is legally privileged, confidential or otherwise protected from use or disclosure. If you are not the intended recipient, you are strictly prohibited from reviewing, disclosing, copying using or disseminating any of this information or taking any action in reliance on or regarding this information. If you have received this fax in error, please notify us immediately by telephone so that we can arrange for its return to [place]. Phone: [phone], Toll-Free: [phone], Fax: [phone] Page: 2 of 2 Call Id: [id number] Nurse Assessment Is this a behavioral health or substance abuse call? ---No Guidelines Guideline Title Affirmed Question Affirmed Notes Nurse Date/Time Eilene Ghazi Time) Heart Rate and Heartbeat Questions [1] Heart beating very rapidly (e.g., > 140 / minute) AND [2] present now (Exception: During exercise.) Lovelace, RN, Amy 02/12/2022 8:35:07 AM Disp. Time Eilene Ghazi Time) Disposition Final User 02/12/2022 8:39:35 AM Go to ED Now Yes Lovelace, RN, Amy Final Disposition 02/12/2022 8:39:35 AM Go to ED Now Yes Lovelace, RN, Amy Caller Disagree/Comply Comply Caller Understands Yes PreDisposition InappropriateToAsk Care Advice Given Per Guideline GO TO ED NOW: * You need to be seen in the Emergency Department. * Go to the ED at ___________ [place] now. Drive carefully. NOTE TO TRIAGER - DRIVING: * Another adult should drive. BRING MEDICINES: * Bring a list of your current medicines when you go to the Emergency Department (ER). CARE ADVICE  given per Heart Rate and Heartbeat Questions (Adult) guideline. Comments User: Wayne Sever, RN Date/Time  Eilene Ghazi Time): 02/12/2022 8:42:32 AM Caller had made an appt. for 11AM today. He will need to cancel that since he is going to the ER. Referrals GO TO FACILITY OTHER - SPECIFY

## 2022-02-12 NOTE — Assessment & Plan Note (Signed)
Multiple hospitalizations.  Managed by [facility] endocrinology Dr. [name] Started with patient with 2 hospitalizations in January 2019- treated with fluids, antibiotics, steroids- ultimately thought most likely due to adrenal insufficiency with no obvious source of infection found -continue hydrocortisone

## 2022-02-12 NOTE — Assessment & Plan Note (Signed)
Continue PPI ?

## 2022-02-12 NOTE — Assessment & Plan Note (Signed)
Baseline creatinine around 1.0.  Likely secondary to hypoperfusion from atrial fib with RVR  Converted back to NSR Will give small 500cc bolus Strict I/O Avoid nephrotoxic drugs Trend

## 2022-02-12 NOTE — Telephone Encounter (Signed)
Patient states: - His apple watch detected yesterday that his heart rate was high and he was going through possible a-fib - It became as high as 190 BPM and as low as 88 BPM. Consistently over 150 BPM - Declines any other symptoms   I offered patient to speak with triage and he accepted. Triage informed me they will call patient back.

## 2022-02-13 ENCOUNTER — Telehealth (HOSPITAL_COMMUNITY): Payer: Self-pay

## 2022-02-13 ENCOUNTER — Other Ambulatory Visit (HOSPITAL_COMMUNITY): Payer: Self-pay

## 2022-02-13 ENCOUNTER — Observation Stay (HOSPITAL_BASED_OUTPATIENT_CLINIC_OR_DEPARTMENT_OTHER): Payer: Medicare Other

## 2022-02-13 DIAGNOSIS — Z8673 Personal history of transient ischemic attack (TIA), and cerebral infarction without residual deficits: Secondary | ICD-10-CM | POA: Diagnosis not present

## 2022-02-13 DIAGNOSIS — Z7984 Long term (current) use of oral hypoglycemic drugs: Secondary | ICD-10-CM | POA: Diagnosis not present

## 2022-02-13 DIAGNOSIS — Z85828 Personal history of other malignant neoplasm of skin: Secondary | ICD-10-CM | POA: Diagnosis not present

## 2022-02-13 DIAGNOSIS — E099 Drug or chemical induced diabetes mellitus without complications: Secondary | ICD-10-CM | POA: Diagnosis not present

## 2022-02-13 DIAGNOSIS — I4891 Unspecified atrial fibrillation: Secondary | ICD-10-CM

## 2022-02-13 DIAGNOSIS — Z79899 Other long term (current) drug therapy: Secondary | ICD-10-CM | POA: Diagnosis not present

## 2022-02-13 DIAGNOSIS — D72829 Elevated white blood cell count, unspecified: Secondary | ICD-10-CM | POA: Diagnosis not present

## 2022-02-13 DIAGNOSIS — Z7982 Long term (current) use of aspirin: Secondary | ICD-10-CM | POA: Diagnosis not present

## 2022-02-13 DIAGNOSIS — N179 Acute kidney failure, unspecified: Secondary | ICD-10-CM | POA: Diagnosis not present

## 2022-02-13 DIAGNOSIS — Z9481 Bone marrow transplant status: Secondary | ICD-10-CM | POA: Diagnosis not present

## 2022-02-13 DIAGNOSIS — I10 Essential (primary) hypertension: Secondary | ICD-10-CM | POA: Diagnosis not present

## 2022-02-13 LAB — CBC
HCT: 42.4 % (ref 39.0–52.0)
Hemoglobin: 14.8 g/dL (ref 13.0–17.0)
MCH: 30.8 pg (ref 26.0–34.0)
MCHC: 34.9 g/dL (ref 30.0–36.0)
MCV: 88.3 fL (ref 80.0–100.0)
Platelets: 365 10*3/uL (ref 150–400)
RBC: 4.8 MIL/uL (ref 4.22–5.81)
RDW: 17.1 % — ABNORMAL HIGH (ref 11.5–15.5)
WBC: 11.9 10*3/uL — ABNORMAL HIGH (ref 4.0–10.5)
nRBC: 0 % (ref 0.0–0.2)

## 2022-02-13 LAB — GLUCOSE, CAPILLARY
Glucose-Capillary: 108 mg/dL — ABNORMAL HIGH (ref 70–99)
Glucose-Capillary: 142 mg/dL — ABNORMAL HIGH (ref 70–99)
Glucose-Capillary: 123 mg/dL — ABNORMAL HIGH (ref 70–99)

## 2022-02-13 LAB — TROPONIN I (HIGH SENSITIVITY): Troponin I (High Sensitivity): 12 ng/L (ref <18)

## 2022-02-13 LAB — BASIC METABOLIC PANEL
Anion gap: 5 (ref 5–15)
BUN: 20 mg/dL (ref 8–23)
CO2: 24 mmol/L (ref 22–32)
Calcium: 9.1 mg/dL (ref 8.9–10.3)
Chloride: 106 mmol/L (ref 98–111)
Creatinine, Ser: 1.3 mg/dL — ABNORMAL HIGH (ref 0.61–1.24)
GFR, Estimated: 58 mL/min — ABNORMAL LOW (ref >60)
Glucose, Bld: 111 mg/dL — ABNORMAL HIGH (ref 70–99)
Potassium: 4.3 mmol/L (ref 3.5–5.1)
Sodium: 135 mmol/L (ref 135–145)

## 2022-02-13 LAB — ECHOCARDIOGRAM COMPLETE
Area-P 1/2: 4.06 cm2
Height: 70 in
S' Lateral: 2.8 cm
Weight: 3633.18 oz

## 2022-02-13 MED ORDER — METOPROLOL TARTRATE 5 MG/5ML IV SOLN: 5.0000 mg | INTRAVENOUS | Status: DC | PRN

## 2022-02-13 MED ORDER — IPRATROPIUM-ALBUTEROL 0.5-2.5 (3) MG/3ML IN SOLN: 3.0000 mL | RESPIRATORY_TRACT | Status: DC | PRN

## 2022-02-13 MED ORDER — MAGNESIUM OXIDE 400 MG PO CAPS: 400.0000 mg | ORAL_CAPSULE | Freq: Every day | ORAL | Status: AC

## 2022-02-13 MED ORDER — SENNOSIDES-DOCUSATE SODIUM 8.6-50 MG PO TABS: 1.0000 | ORAL_TABLET | Freq: Every evening | ORAL | Status: DC | PRN

## 2022-02-13 MED ORDER — HYDRALAZINE HCL 20 MG/ML IJ SOLN: 10.0000 mg | INTRAMUSCULAR | Status: DC | PRN

## 2022-02-13 MED ORDER — GUAIFENESIN 100 MG/5ML PO LIQD: 5.0000 mL | ORAL | Status: DC | PRN

## 2022-02-13 MED ORDER — PERFLUTREN LIPID MICROSPHERE
1.0000 mL | INTRAVENOUS | Status: AC | PRN
  Administered 2022-02-13: 5 mL via INTRAVENOUS

## 2022-02-13 MED ORDER — APIXABAN 5 MG PO TABS
5.0000 mg | ORAL_TABLET | Freq: Two times a day (BID) | ORAL | 0 refills | Status: DC
  Filled 2022-02-13: qty 60, 30d supply, fill #0

## 2022-02-13 NOTE — Care Management CC44 (Signed)
Condition Code 44 Documentation Completed  Patient Details  Name: Brandon [name] MRN: [id number] Date of Birth: October 04, 1950   Condition Code 44 given:  Yes Patient signature on Condition Code 44 notice:  Yes Documentation of 2 MD's agreement:  Yes Code 44 added to claim:  Yes    Bethena Roys, RN 02/13/2022, 12:46 PM

## 2022-02-13 NOTE — TOC Benefit Eligibility Note (Signed)
Patient Teacher, English as a foreign language completed.    The patient is currently admitted and upon discharge could be taking Eliquis 5 mg.  The current 30 day co-pay is $47.00.   The patient is currently admitted and upon discharge could be taking Xarelto 20 mg.  The current 30 day co-pay is $47.00.   The patient is insured through [place] Patient [place] Patient Advocate Team Direct Number: [phone]  Fax: [phone]

## 2022-02-13 NOTE — Discharge Summary (Signed)
Physician Discharge Summary  Brandon [name] ZOX:[id number] DOB: 1950-06-27 DOA: 02/12/2022  PCP: [name], MD  Admit date: 02/12/2022 Discharge date: 02/13/2022  Admitted From: [facility] Disposition:  Home  Recommendations for Outpatient Follow-up:  Follow up with PCP in 1-2 weeks Please obtain BMP/CBC in one week your next doctors visit.  Follow up with outpatient Cardiology in next 2-4 weeks  Take Metoprolol '25mg'$  po bid. Take as needed metoprolol '25mg'$  po if Heart rate persist over 110 for > 10 mins at rest and notify your PCP/Cardiology or present to the ED.  Follow up with Outpatient PCP and Hematology  Take Eliquis '5mg'$  twice daily.    Discharge Condition: Stable CODE STATUS: Full Code Diet recommendation: Cardiac  Brief/Interim Summary: 71 y.o. male with medical history significant of  myelodysplastic syndrome s/p bone marrow transplant; adrenal insufficiency on chronic steroids; TIA; HTN; and HLD presenting with palpitations. He states around 3pm yesterday his apple watch went off and said potential atrial fibrillation and high heart rate. He was found to be in A fib with rvr and started on Cardizem Drip. He spontaneously converted to NSR after taking PO Metoprolol. Eliquis was also initiated.  Today he is medically stable for discharge. Currently he is in NSR, spouse present at bedside during my eval and dc instructions.    Assessment and Plan: * New onset atrial fibrillation with RVR  -CHA2DS2-Vasc of 5. Discussed risks and benefits of AC. Will start him on eliquis '5mg'$  BID, stop ASA.  Now in NSR. Echo will be done prior to his dc. TSH is normal.  Doing well on Metoprolol '25mg'$  po bid. Can take additional dose if needed as mentioned above.   AKI (acute kidney injury) ([place]) Resolved.   Leukocytosis On Chronic steroids. No signs of infection.   Essential hypertension Resume home meds.   Adrenal insufficiency ([place]) Multiple hospitalizations.  Managed by [facility]  endocrinology Dr. [name] Resume home meds.   History of myelodysplastic syndrome s/p stem cell transplant in 2017  Followed by [facility], on chronic steroids No signs of infection.   Steroid-induced diabetes ([place]) Recent A1C 6.5 Resume metformin.   History of CVA (cerebrovascular accident) Stop ASA, start eliquis History of CVA in 2020 and appeared to have prior cerebellar CVA Continue crestor   Hyperlipidemia Continue crestor   GERD (gastroesophageal reflux disease) Continue PPI        Body mass index is 32.58 kg/m.       Discharge Diagnoses:  Principal Problem:   New onset atrial fibrillation with RVR  Active Problems:   AKI (acute kidney injury) ([place])   Leukocytosis   Essential hypertension   Adrenal insufficiency ([facility])   History of myelodysplastic syndrome s/p stem cell transplant in 2017    Steroid-induced diabetes ([place])   History of CVA (cerebrovascular accident)   Hyperlipidemia   GERD (gastroesophageal reflux disease)      Consultations: None  Subjective: Doing well no complaints. Spouse at bedside.   Discharge Exam: Vitals:   02/13/22 0448 02/13/22 0840  BP: 133/82 122/86  Pulse: 68 75  Resp: 14 13  Temp: 97.8 F (36.6 C) 98.5 F (36.9 C)  SpO2: 95% 95%   Vitals:   02/12/22 2049 02/12/22 2115 02/13/22 0448 02/13/22 0840  BP:  111/72 133/82 122/86  Pulse:  69 68 75  Resp:  '13 14 13  '$ Temp:  97.8 F (36.6 C) 97.8 F (36.6 C) 98.5 F (36.9 C)  TempSrc:  Oral Oral Oral  SpO2: 96% 95%  95% 95%  Weight:      Height:        General: Pt is alert, awake, not in acute distress Cardiovascular: RRR, S1/S2 +, no rubs, no gallops Respiratory: CTA bilaterally, no wheezing, no rhonchi Abdominal: Soft, NT, ND, bowel sounds + Extremities: no edema, no cyanosis  Discharge Instructions  Discharge Instructions     Amb referral to AFIB Clinic   Complete by: As directed       Allergies as of 02/13/2022       Reactions   Levofloxacin  Other (See Comments)   Muscle pain    Tape Other (See Comments)   Skin irritation that may cause a scab if left on too long        Medication List     STOP taking these medications    aspirin EC 325 MG tablet   polyvinyl alcohol 1.4 % ophthalmic solution Commonly known as: LIQUIFILM TEARS       TAKE these medications    acetaminophen 500 MG tablet Commonly known as: TYLENOL Take 500-1,000 mg by mouth every 6 (six) hours as needed for mild pain or headache.   acyclovir 400 MG tablet Commonly known as: ZOVIRAX Take 400 mg by mouth 2 (two) times daily.   albuterol 108 (90 Base) MCG/ACT inhaler Commonly known as: VENTOLIN HFA Inhale 2 puffs into the lungs in the morning and at bedtime.   Alpha-Lipoic Acid 600 MG Caps Take 600 mg by mouth daily with supper.   amLODipine 10 MG tablet Commonly known as: NORVASC TAKE 1 TABLET BY MOUTH EVERY DAY   amoxicillin 500 MG capsule Commonly known as: AMOXIL Take 2,000 mg by mouth See admin instructions. Take 2,000 mg by mouth one hour prior to dental appointments   apixaban 5 MG Tabs tablet Commonly known as: ELIQUIS Take 1 tablet (5 mg total) by mouth 2 (two) times daily.   famotidine 20 MG tablet Commonly known as: PEPCID TAKE 1 TABLET BY MOUTH EVERY DAY AT NIGHT   fluticasone 50 MCG/ACT nasal spray Commonly known as: FLONASE Place 1-2 sprays into both nostrils daily as needed (for seasonal allergies).   hydrocortisone 10 MG tablet Commonly known as: CORTEF Take 10-15 mg by mouth See admin instructions. Take 15 mg by mouth in the morning and 10 mg at 4 PM   hydrocortisone 20 MG tablet Commonly known as: CORTEF Take 20 mg by mouth as needed (AS DIRECTED- during times of illness or for an adrenal emergency).   loratadine 10 MG tablet Commonly known as: CLARITIN Take 10 mg by mouth daily as needed (for seasonal allergies).   Magnesium Oxide 400 MG Caps Take 1 capsule (400 mg total) by mouth 5 (five) times  daily. What changed: when to take this   metFORMIN 500 MG tablet Commonly known as: GLUCOPHAGE Take 500 mg by mouth 2 (two) times daily.   metoprolol tartrate 25 MG tablet Commonly known as: LOPRESSOR Take 25 mg by mouth in the morning and at bedtime.   montelukast 10 MG tablet Commonly known as: SINGULAIR Take 10 mg by mouth at bedtime.   Multi-Vitamin tablet Take 1 tablet by mouth daily with breakfast.   niacinamide 500 MG tablet Take 500 mg by mouth 2 (two) times daily with a meal.   omeprazole 10 MG capsule Commonly known as: PRILOSEC TAKE 1 CAPSULE BY MOUTH EVERY DAY What changed:  how much to take when to take this   ondansetron 8 MG disintegrating tablet Commonly known as:  ZOFRAN-ODT Take 8 mg by mouth every 8 (eight) hours as needed for nausea.   OneTouch Delica Plus [id number] Misc   polyethylene glycol 17 g packet Commonly known as: MIRALAX / GLYCOLAX Take 17 g by mouth See admin instructions. Mix 17 grams of powder into 4-8 ounces of water and drink one to two times a week   PRESCRIPTION MEDICATION Place 1 application  onto teeth See admin instructions. Sodium Fluoride 5000 dry mouth toothpaste- Brush teeth for 2 minutes using the toothpaste daily as directed   Pulmicort Flexhaler 180 MCG/ACT inhaler Generic drug: budesonide Inhale 1 puff into the lungs in the morning and at bedtime.   REFRESH RELIEVA OP Place 1 drop into both eyes 4 (four) times daily.   rosuvastatin 40 MG tablet Commonly known as: CRESTOR TAKE 1 TABLET BY MOUTH EVERY DAY What changed: when to take this   senna-docusate 8.6-50 MG tablet Commonly known as: Senokot-S Take 2 tablets by mouth at bedtime.   sildenafil 100 MG tablet Commonly known as: VIAGRA Take 1 tablet (100 mg total) by mouth daily as needed for erectile dysfunction.   sodium chloride 0.65 % Soln nasal spray Commonly known as: OCEAN Place 1-2 sprays into both nostrils in the morning and at bedtime.    venlafaxine 25 MG tablet Commonly known as: EFFEXOR Take 25 mg by mouth 2 (two) times daily.   Vitamin D 50 MCG (2000 UT) Caps Take 2,000 Units by mouth daily with supper.        Follow-up Information     [name], MD Follow up in 1 week(s).   Specialty: Family Medicine Contact information: [address] [place] [address] [phone]                Allergies  Allergen Reactions   Levofloxacin Other (See Comments)    Muscle pain    Tape Other (See Comments)    Skin irritation that may cause a scab if left on too long    You were cared for by a hospitalist during your hospital stay. If you have any questions about your discharge medications or the care you received while you were in the hospital after you are discharged, you can call the unit and asked to speak with the hospitalist on call if the hospitalist that took care of you is not available. Once you are discharged, your primary care physician will handle any further medical issues. Please note that no refills for any discharge medications will be authorized once you are discharged, as it is imperative that you return to your primary care physician (or establish a relationship with a primary care physician if you do not have one) for your aftercare needs so that they can reassess your need for medications and monitor your lab values.   Procedures/Studies: DG Chest Port 1 View  Result Date: 02/12/2022 CLINICAL DATA:  71 year old male presenting for palpitations. EXAM: PORTABLE CHEST 1 VIEW COMPARISON:  September 09, 2019 FINDINGS: Trachea midline. Cardiomediastinal contours and hilar structures are normal. Lungs are clear. No effusion. No consolidative changes. No pneumothorax. On limited assessment no acute skeletal process. IMPRESSION: No acute cardiopulmonary disease. Electronically Signed   By: Zetta Bills M.D.   On: 02/12/2022 10:11     The results of significant diagnostics from this  hospitalization (including imaging, microbiology, ancillary and laboratory) are listed below for reference.     Microbiology: Recent Results (from the past 240 hour(s))  Culture, blood (Routine X 2) w Reflex  to ID Panel     Status: None (Preliminary result)   Collection Time: 02/12/22  7:22 PM   Specimen: BLOOD  Result Value Ref Range Status   Specimen Description BLOOD LEFT ANTECUBITAL  Final   Special Requests   Final    BOTTLES DRAWN AEROBIC AND ANAEROBIC Blood Culture adequate volume   Culture   Final    NO GROWTH < 12 HOURS Performed at [facility], [place] [address] [place], [place] [address]    Report Status PENDING  Incomplete  Culture, blood (Routine X 2) w Reflex to ID Panel     Status: None (Preliminary result)   Collection Time: 02/12/22  7:22 PM   Specimen: BLOOD  Result Value Ref Range Status   Specimen Description BLOOD BLOOD LEFT FOREARM  Final   Special Requests   Final    BOTTLES DRAWN AEROBIC AND ANAEROBIC Blood Culture adequate volume   Culture   Final    NO GROWTH < 12 HOURS Performed at [facility], [place] [address], [place], [place] [address]    Report Status PENDING  Incomplete     Labs: BNP (last 3 results) No results for input(s): "BNP" in the last 8760 hours. Basic Metabolic Panel: Recent Labs  Lab 02/12/22 0946 02/12/22 1921 02/13/22 0359  NA 138  --  135  K 4.3  --  4.3  CL 100  --  106  CO2 24  --  24  GLUCOSE 165*  --  111*  BUN 16  --  20  CREATININE 1.32*  --  1.30*  CALCIUM 9.8  --  9.1  MG  --  2.1  --    Liver Function Tests: Recent Labs  Lab 02/12/22 1019  AST 27  ALT 36  ALKPHOS 84  BILITOT 0.7  PROT 8.1  ALBUMIN 4.2   No results for input(s): "LIPASE", "AMYLASE" in the last 168 hours. No results for input(s): "AMMONIA" in the last 168 hours. CBC: Recent Labs  Lab 02/12/22 0946 02/13/22 0359  WBC 19.7* 11.9*  NEUTROABS 8.0*  --   HGB 16.5 14.8  HCT 49.4 42.4  MCV 90.0 88.3  PLT 400 365   Cardiac  Enzymes: No results for input(s): "CKTOTAL", "CKMB", "CKMBINDEX", "TROPONINI" in the last 168 hours. BNP: Invalid input(s): "POCBNP" CBG: Recent Labs  Lab 02/12/22 2117 02/13/22 0731 02/13/22 1126  GLUCAP 167* 108* 142*   D-Dimer No results for input(s): "DDIMER" in the last 72 hours. Hgb A1c No results for input(s): "HGBA1C" in the last 72 hours. Lipid Profile No results for input(s): "CHOL", "HDL", "LDLCALC", "TRIG", "CHOLHDL", "LDLDIRECT" in the last 72 hours. Thyroid function studies Recent Labs    02/12/22 1921  TSH 3.398   Anemia work up No results for input(s): "VITAMINB12", "FOLATE", "FERRITIN", "TIBC", "IRON", "RETICCTPCT" in the last 72 hours. Urinalysis    Component Value Date/Time   COLORURINE YELLOW 09/08/2019 2120   APPEARANCEUR HAZY (A) 09/08/2019 2120   LABSPEC 1.011 09/08/2019 2120   PHURINE 5.0 09/08/2019 2120   GLUCOSEU NEGATIVE 09/08/2019 2120   GLUCOSEU NEGATIVE 12/23/2009 0742   HGBUR NEGATIVE 09/08/2019 2120   HGBUR negative 01/24/2007 0810   BILIRUBINUR NEGATIVE 09/08/2019 2120   BILIRUBINUR negative 08/10/2014 1057   BILIRUBINUR n 02/11/2013 1059   KETONESUR NEGATIVE 09/08/2019 2120   PROTEINUR NEGATIVE 09/08/2019 2120   UROBILINOGEN 0.2 08/10/2014 1057   UROBILINOGEN 0.2 12/23/2009 0742   NITRITE NEGATIVE 09/08/2019 2120   LEUKOCYTESUR SMALL (A) 09/08/2019  2120   Sepsis Labs Recent Labs  Lab 02/12/22 0946 02/13/22 0359  WBC 19.7* 11.9*   Microbiology Recent Results (from the past 240 hour(s))  Culture, blood (Routine X 2) w Reflex to ID Panel     Status: None (Preliminary result)   Collection Time: 02/12/22  7:22 PM   Specimen: BLOOD  Result Value Ref Range Status   Specimen Description BLOOD LEFT ANTECUBITAL  Final   Special Requests   Final    BOTTLES DRAWN AEROBIC AND ANAEROBIC Blood Culture adequate volume   Culture   Final    NO GROWTH < 12 HOURS Performed at [facility], [place] [address] [place], [place]  [address]    Report Status PENDING  Incomplete  Culture, blood (Routine X 2) w Reflex to ID Panel     Status: None (Preliminary result)   Collection Time: 02/12/22  7:22 PM   Specimen: BLOOD  Result Value Ref Range Status   Specimen Description BLOOD BLOOD LEFT FOREARM  Final   Special Requests   Final    BOTTLES DRAWN AEROBIC AND ANAEROBIC Blood Culture adequate volume   Culture   Final    NO GROWTH < 12 HOURS Performed at [facility], [place] [address] [place], [place] [address]    Report Status PENDING  Incomplete     Time coordinating discharge:  I have spent 35 minutes face to face with the patient and on the ward discussing the patients care, assessment, plan and disposition with other care givers. >50% of the time was devoted counseling the patient about the risks and benefits of treatment/Discharge disposition and coordinating care.   SIGNED:   Damita Lack, MD  [facility] 02/13/2022, 2:04 PM   If 7PM-7AM, please contact night-coverage

## 2022-02-13 NOTE — Telephone Encounter (Signed)
FYI

## 2022-02-13 NOTE — Discharge Instructions (Addendum)
Follow up with outpatient Cardiology in next 2-4 weeks  Take Metoprolol '25mg'$  po bid. Take as needed metoprolol '25mg'$  po if Heart rate persist over 110 for > 10 mins at rest and notify your PCP/Cardiology or present to the ED.  Follow up with Outpatient PCP and Hematology  Take Eliquis '5mg'$  twice daily.   Information on my medicine - ELIQUIS (apixaban)  Why was Eliquis prescribed for you? Eliquis was prescribed for you to reduce the risk of a blood clot forming that can cause a stroke if you have a medical condition called atrial fibrillation (a type of irregular heartbeat).  What do You need to know about Eliquis ? Take your Eliquis TWICE DAILY - one tablet in the morning and one tablet in the evening with or without food. If you have difficulty swallowing the tablet whole please discuss with your pharmacist how to take the medication safely.  Take Eliquis exactly as prescribed by your doctor and DO NOT stop taking Eliquis without talking to the doctor who prescribed the medication.  Stopping may increase your risk of developing a stroke.  Refill your prescription before you run out.  After discharge, you should have regular check-up appointments with your healthcare provider that is prescribing your Eliquis.  In the future your dose may need to be changed if your kidney function or weight changes by a significant amount or as you get older.  What do you do if you miss a dose? If you miss a dose, take it as soon as you remember on the same day and resume taking twice daily.  Do not take more than one dose of ELIQUIS at the same time to make up a missed dose.  Important Safety Information A possible side effect of Eliquis is bleeding. You should call your healthcare provider right away if you experience any of the following: Bleeding from an injury or your nose that does not stop. Unusual colored urine (red or dark brown) or unusual colored stools (red or black). Unusual bruising for  unknown reasons. A serious fall or if you hit your head (even if there is no bleeding).  Some medicines may interact with Eliquis and might increase your risk of bleeding or clotting while on Eliquis. To help avoid this, consult your healthcare provider or pharmacist prior to using any new prescription or non-prescription medications, including herbals, vitamins, non-steroidal anti-inflammatory drugs (NSAIDs) and supplements.  This website has more information on Eliquis (apixaban): http://www.eliquis.com/eliquis/home

## 2022-02-13 NOTE — Care Management Obs Status (Signed)
[facility] NOTIFICATION   Patient Details  Name: Brandon [name] MRN: [id number] Date of Birth: 08/27/1950   Medicare Observation Status Notification Given:  Yes    Bethena Roys, RN 02/13/2022, 12:34 PM

## 2022-02-13 NOTE — Telephone Encounter (Signed)
Pharmacy Patient Advocate Encounter  Insurance verification completed.    The patient is insured through [employer]   The patient is currently admitted and ran test claims for the following: Eliquis, Xarelto.  Copays and coinsurance results were relayed to Inpatient clinical team.  

## 2022-02-13 NOTE — Care Management Obs Status (Signed)
[facility] NOTIFICATION   Patient Details  Name: Brandon [name] MRN: [id number] Date of Birth: 1951-06-08   Medicare Observation Status Notification Given:  Yes    Bethena Roys, RN 02/13/2022, 12:35 PM

## 2022-02-13 NOTE — Progress Notes (Signed)
Discharge instructions reviewed with pt. Copy of instructions given to pt, pt was given 1 filled script (Eliquis) by [facility] pharmacy.  Pt's wife in route and will call/text patient when she arrives at main entrance.   At 1813   Pt d/c'd via wheelchair with belongings,           Escorted by unit staff.

## 2022-02-13 NOTE — Telephone Encounter (Signed)
Seen in ED thankfully

## 2022-02-14 LAB — PATHOLOGIST SMEAR REVIEW: Path Review: REACTIVE

## 2022-02-17 LAB — CULTURE, BLOOD (ROUTINE X 2)
Culture: NO GROWTH
Culture: NO GROWTH
Special Requests: ADEQUATE
Special Requests: ADEQUATE

## 2022-03-06 ENCOUNTER — Other Ambulatory Visit: Payer: Self-pay | Admitting: Family Medicine

## 2022-03-06 ENCOUNTER — Other Ambulatory Visit: Payer: Self-pay

## 2022-03-06 MED ORDER — APIXABAN 5 MG PO TABS: 5.0000 mg | ORAL_TABLET | Freq: Two times a day (BID) | ORAL | 3 refills | Status: AC

## 2022-03-19 ENCOUNTER — Encounter: Payer: Self-pay | Admitting: *Deleted

## 2022-03-23 DIAGNOSIS — Z23 Encounter for immunization: Secondary | ICD-10-CM | POA: Diagnosis not present

## 2022-04-03 DIAGNOSIS — Z8673 Personal history of transient ischemic attack (TIA), and cerebral infarction without residual deficits: Secondary | ICD-10-CM | POA: Diagnosis not present

## 2022-04-03 DIAGNOSIS — I10 Essential (primary) hypertension: Secondary | ICD-10-CM | POA: Diagnosis not present

## 2022-04-03 DIAGNOSIS — D469 Myelodysplastic syndrome, unspecified: Secondary | ICD-10-CM | POA: Diagnosis not present

## 2022-04-03 DIAGNOSIS — I480 Paroxysmal atrial fibrillation: Secondary | ICD-10-CM | POA: Diagnosis not present

## 2022-04-03 DIAGNOSIS — E785 Hyperlipidemia, unspecified: Secondary | ICD-10-CM | POA: Diagnosis not present

## 2022-04-03 DIAGNOSIS — D89811 Chronic graft-versus-host disease: Secondary | ICD-10-CM | POA: Diagnosis not present

## 2022-04-03 DIAGNOSIS — T8609 Other complications of bone marrow transplant: Secondary | ICD-10-CM | POA: Diagnosis not present

## 2022-04-03 DIAGNOSIS — Z9484 Stem cells transplant status: Secondary | ICD-10-CM | POA: Diagnosis not present

## 2022-04-03 DIAGNOSIS — Z79899 Other long term (current) drug therapy: Secondary | ICD-10-CM | POA: Diagnosis not present

## 2022-04-03 DIAGNOSIS — Z7901 Long term (current) use of anticoagulants: Secondary | ICD-10-CM | POA: Diagnosis not present

## 2022-04-03 DIAGNOSIS — Z9481 Bone marrow transplant status: Secondary | ICD-10-CM | POA: Diagnosis not present

## 2022-04-06 DIAGNOSIS — Z79899 Other long term (current) drug therapy: Secondary | ICD-10-CM | POA: Diagnosis not present

## 2022-04-06 DIAGNOSIS — M8580 Other specified disorders of bone density and structure, unspecified site: Secondary | ICD-10-CM | POA: Diagnosis not present

## 2022-04-06 DIAGNOSIS — T380X5A Adverse effect of glucocorticoids and synthetic analogues, initial encounter: Secondary | ICD-10-CM | POA: Diagnosis not present

## 2022-04-06 DIAGNOSIS — E2740 Unspecified adrenocortical insufficiency: Secondary | ICD-10-CM | POA: Diagnosis not present

## 2022-04-06 DIAGNOSIS — R7303 Prediabetes: Secondary | ICD-10-CM | POA: Diagnosis not present

## 2022-04-06 DIAGNOSIS — Z7984 Long term (current) use of oral hypoglycemic drugs: Secondary | ICD-10-CM | POA: Diagnosis not present

## 2022-04-06 DIAGNOSIS — E273 Drug-induced adrenocortical insufficiency: Secondary | ICD-10-CM | POA: Diagnosis not present

## 2022-04-06 LAB — HEMOGLOBIN A1C: Hemoglobin A1C: 6.5

## 2022-04-12 ENCOUNTER — Encounter: Payer: Self-pay | Admitting: Family Medicine

## 2022-04-12 ENCOUNTER — Ambulatory Visit (INDEPENDENT_AMBULATORY_CARE_PROVIDER_SITE_OTHER): Payer: Medicare Other | Admitting: Family Medicine

## 2022-04-12 VITALS — BP 100/72 | HR 72 | Temp 97.3°F | Ht 70.0 in | Wt 228.6 lb

## 2022-04-12 DIAGNOSIS — T380X5A Adverse effect of glucocorticoids and synthetic analogues, initial encounter: Secondary | ICD-10-CM

## 2022-04-12 DIAGNOSIS — M85851 Other specified disorders of bone density and structure, right thigh: Secondary | ICD-10-CM

## 2022-04-12 DIAGNOSIS — I10 Essential (primary) hypertension: Secondary | ICD-10-CM

## 2022-04-12 DIAGNOSIS — R739 Hyperglycemia, unspecified: Secondary | ICD-10-CM

## 2022-04-12 DIAGNOSIS — E785 Hyperlipidemia, unspecified: Secondary | ICD-10-CM

## 2022-04-12 DIAGNOSIS — E2740 Unspecified adrenocortical insufficiency: Secondary | ICD-10-CM

## 2022-04-12 LAB — MICROALBUMIN / CREATININE URINE RATIO
Creatinine,U: 118.6 mg/dL
Microalb Creat Ratio: 1.7 mg/g (ref 0.0–30.0)
Microalb, Ur: 2.1 mg/dL — ABNORMAL HIGH (ref 0.0–1.9)

## 2022-04-12 NOTE — Progress Notes (Signed)
Phone [phone] In person visit   Subjective:   Brandon [name] is a 71 y.o. year old very pleasant male patient who presents for/with See problem oriented charting Chief Complaint  Patient presents with   Follow-up   Hyperlipidemia   Hypertension   Past Medical History-  Patient Active Problem List   Diagnosis Date Noted   Allergic rhinitis 10/03/2019    Priority: High   Pancreatic lesion 10/03/2019    Priority: High   History of CVA (cerebrovascular accident) 03/10/2019    Priority: High   Adrenal insufficiency ([place]) 08/06/2017    Priority: High   GVHD (graft versus host disease) ([place]) 03/04/2017    Priority: High   History of myelodysplastic syndrome s/p stem cell transplant in 2017  12/06/2015    Priority: High   Steroid-induced hyperglycemia 08/16/2014    Priority: High   Erectile dysfunction 01/08/2022    Priority: Medium    Fatty liver 01/05/2020    Priority: Medium    Osteopenia 10/05/2019    Priority: Medium    History of DVT (deep vein thrombosis) 09/25/2018    Priority: Medium    Post chemotherapy Dry eyes due to decreased tear production 08/06/2017    Priority: Medium    post chemotherapy Peripheral neuropathy 08/06/2017    Priority: Medium    BPH associated with nocturia 11/15/2014    Priority: Medium    Hyperlipidemia 02/04/2007    Priority: Medium    Essential hypertension 02/04/2007    Priority: Medium    History of stem cell transplant ([place]) 03/04/2017    Priority: Low   Leukopenia 11/15/2014    Priority: Low   Chest pain 08/13/2014    Priority: Low   GERD (gastroesophageal reflux disease) 05/03/2014    Priority: Low   Cervical disc disorder with radiculopathy of cervical region 07/14/2010    Priority: [place] LOSS BILATERAL 07/14/2010    Priority: Low   ERECTILE DYSFUNCTION 02/07/2007    Priority: Low   New onset atrial fibrillation with RVR  02/12/2022   AKI (acute kidney injury) ([place]) 02/12/2022   Leukocytosis  02/12/2022   Steroid-induced diabetes ([place]) 02/12/2022    Medications- reviewed and updated Current Outpatient Medications  Medication Sig Dispense Refill   acetaminophen (TYLENOL) 500 MG tablet Take 500-1,000 mg by mouth every 6 (six) hours as needed for mild pain or headache.     acyclovir (ZOVIRAX) 400 MG tablet Take 400 mg by mouth 2 (two) times daily.     albuterol (VENTOLIN HFA) 108 (90 Base) MCG/ACT inhaler Inhale 2 puffs into the lungs in the morning and at bedtime.     Alpha-Lipoic Acid 600 MG CAPS Take 600 mg by mouth daily with supper.     amLODipine (NORVASC) 10 MG tablet TAKE 1 TABLET BY MOUTH EVERY DAY (Patient taking differently: Take 10 mg by mouth daily.) 90 tablet 3   amoxicillin (AMOXIL) 500 MG capsule Take 2,000 mg by mouth See admin instructions. Take 2,000 mg by mouth one hour prior to dental appointments     apixaban (ELIQUIS) 5 MG TABS tablet Take 1 tablet (5 mg total) by mouth 2 (two) times daily. 60 tablet 3   Carboxymethylcellul-Glycerin (REFRESH RELIEVA OP) Place 1 drop into both eyes 4 (four) times daily.     Cholecalciferol (VITAMIN D) 50 MCG (2000 UT) CAPS Take 2,000 Units by mouth daily with supper.     fluticasone (FLONASE) 50 MCG/ACT nasal spray Place 1-2 sprays into both nostrils daily  as needed (for seasonal allergies).     hydrocortisone (CORTEF) 10 MG tablet Take 10-15 mg by mouth See admin instructions. Take 15 mg by mouth in the morning and 10 mg at 4 PM     hydrocortisone (CORTEF) 20 MG tablet Take 20 mg by mouth as needed (AS DIRECTED- during times of illness or for an adrenal emergency).     Lancets (ONETOUCH DELICA PLUS [id number]) MISC      loratadine (CLARITIN) 10 MG tablet Take 10 mg by mouth daily as needed (for seasonal allergies).     Magnesium Oxide 400 MG CAPS Take 1 capsule (400 mg total) by mouth daily with supper.     metFORMIN (GLUCOPHAGE) 500 MG tablet Take 500 mg by mouth 2 (two) times daily.     metoprolol succinate (TOPROL-XL) 25 MG 24  hr tablet Take 1 tablet by mouth 2 (two) times daily. Per [facility]ology- prefers XR BID     montelukast (SINGULAIR) 10 MG tablet Take 10 mg by mouth at bedtime.     Multiple Vitamin (MULTI-VITAMIN) tablet Take 1 tablet by mouth daily with breakfast.     niacinamide 500 MG tablet Take 500 mg by mouth 2 (two) times daily with a meal.     omeprazole (PRILOSEC) 10 MG capsule TAKE 1 CAPSULE BY MOUTH EVERY DAY (Patient taking differently: Take 20 mg by mouth daily before breakfast.) 90 capsule 1   ondansetron (ZOFRAN-ODT) 8 MG disintegrating tablet Take 8 mg by mouth every 8 (eight) hours as needed for nausea.     polyethylene glycol (MIRALAX / GLYCOLAX) 17 g packet Take 17 g by mouth See admin instructions. Mix 17 grams of powder into 4-8 ounces of water and drink one to two times a week     PRESCRIPTION MEDICATION Place 1 application  onto teeth See admin instructions. Sodium Fluoride 5000 dry mouth toothpaste- Brush teeth for 2 minutes using the toothpaste daily as directed     PULMICORT FLEXHALER 180 MCG/ACT inhaler Inhale 1 puff into the lungs in the morning and at bedtime.     rosuvastatin (CRESTOR) 40 MG tablet TAKE 1 TABLET BY MOUTH EVERY DAY 90 tablet 3   senna-docusate (SENOKOT-S) 8.6-50 MG tablet Take 2 tablets by mouth at bedtime.      sildenafil (VIAGRA) 100 MG tablet Take 1 tablet (100 mg total) by mouth daily as needed for erectile dysfunction. 30 tablet 3   sodium chloride (OCEAN) 0.65 % SOLN nasal spray Place 1-2 sprays into both nostrils in the morning and at bedtime.     venlafaxine (EFFEXOR) 25 MG tablet Take 25 mg by mouth 2 (two) times daily.     diltiazem (CARDIZEM) 30 MG tablet Take 30 mg by mouth daily as needed. For elevated HR     No current facility-administered medications for this visit.     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 Laurine Blazer on hydrocortisone 15 mg--> 12.5 soonin the morning and 10 mg in the evening  -Nausea issues could be related-on ondansetron and has seen due to GI  - needs cortisol and acth a month after this change nad will forward to [facility]  # Atrial fibrillation-new onset August 2023- follows at [facility]: Rate controlled with metoprolol 25 mg twice daily XR, diltiazem available but hasnt had to use Anticoagulated with Eliquis 5 mg twice daily A/P: appropriately anticoagulated and rate controlled continue current meds    #hypertension S: medication: Amlodipine 10 mg, metoprolol 25 mg twice daily XR Home readings #s: low 120s high 70s A/P: Controlled. Continue current medications.   #hyperlipidemia S: Medication:Rosuvastatin 40 mg Lab Results  Component Value Date   CHOL 132 08/30/2021   HDL 50 08/30/2021   LDLCALC 48 08/30/2021   LDLDIRECT 77 02/17/2020   TRIG 172 (A) 08/30/2021   CHOLHDL 2.9 08/22/2020  A/P: cholesterol looked great in march- recheck next year- at goal even with stroke history- but in retrospect with newly found a fib wonder if stroke could have been related to a fib   #Steroid induced hyperglycemia S: Medication:Metformin 500 mg twice daily - endocrine wanted him to increase to '1000mg'$  twice daily A/P: reasonable control- he is going to gradually adjust on this to their recommendations once makes sure doing well with decrease hydrocortisone  #anxiety/Depressed mood S: Medication: Venlafaxine/Effexor 25 mg twice daily per oncology team. Has not noted  substantial worsening - perhaps mild improvement- still lower motivation with fatigue issues A/P: reasonably stable- but imperfect with other changes in meds- want to wait until those have stabilized before adjusting further   # GERD S:Medication:  omeprazole 10 mg A/P: Controlled. Continue current medications.     #mild osteopenia- 2000 vitamin D. recheck DEXA now  Recommended follow up: Return in about 14 weeks (around 07/19/2022) for followup or sooner if needed.Schedule b4 you leave. Future Appointments  Date Time Provider [place]  01/07/2023  8:45 AM [facility] HEALTH COACH [facility]   Lab/Order associations:   ICD-10-CM   1. Steroid-induced hyperglycemia  R73.9 Microalbumin / creatinine urine ratio   T38.0X5A     2. Adrenal insufficiency (HCC)  E27.40 Cortisol    ACTH    3. Osteopenia of right hip  M85.851 DG Bone Density    4. Essential hypertension  I10     5. Hyperlipidemia, unspecified hyperlipidemia type  E78.5      Return precautions advised.  Garret Reddish, MD

## 2022-04-12 NOTE — Patient Instructions (Addendum)
Have diabetic eye exam sent to [place] at [phone] when you have it done next month.  Schedule a lab visit at the check out desk for 4 weeks out. Return for future fasting labs meaning nothing but water after midnight please- try to get to as close to 8 am as possible. Ok to take your medications with water.   Please stop by lab before you go- URINE ONLY If you have mychart- we will send your results within 3 business days of [place] receiving them.  If you do not have mychart- we will call you about results within 5 business days of [place] receiving them.  *please also note that you will see labs on mychart as soon as they post. I will later go in and write notes on them- will say "notes from Dr. Yong Channel"   Schedule your bone density test at check out desk.  - located [address] across the [address] from [place] - in the basement - you DO NEED an appointment for the bone density tests.   Recommended follow up: Return in about 14 weeks (around 07/19/2022) for followup or sooner if needed.Schedule b4 you leave.

## 2022-04-16 ENCOUNTER — Ambulatory Visit (INDEPENDENT_AMBULATORY_CARE_PROVIDER_SITE_OTHER)
Admission: RE | Admit: 2022-04-16 | Discharge: 2022-04-16 | Disposition: A | Discharge status: Home / Self Care | Payer: Medicare Other | Source: Ambulatory Visit | Attending: Family Medicine | Admitting: Family Medicine

## 2022-04-16 DIAGNOSIS — M85851 Other specified disorders of bone density and structure, right thigh: Secondary | ICD-10-CM

## 2022-04-18 DIAGNOSIS — G4733 Obstructive sleep apnea (adult) (pediatric): Secondary | ICD-10-CM | POA: Diagnosis not present

## 2022-04-18 DIAGNOSIS — R600 Localized edema: Secondary | ICD-10-CM | POA: Diagnosis not present

## 2022-04-18 DIAGNOSIS — Z86718 Personal history of other venous thrombosis and embolism: Secondary | ICD-10-CM | POA: Diagnosis not present

## 2022-04-18 DIAGNOSIS — I10 Essential (primary) hypertension: Secondary | ICD-10-CM | POA: Diagnosis not present

## 2022-04-18 DIAGNOSIS — R739 Hyperglycemia, unspecified: Secondary | ICD-10-CM | POA: Diagnosis not present

## 2022-04-18 DIAGNOSIS — K859 Acute pancreatitis without necrosis or infection, unspecified: Secondary | ICD-10-CM | POA: Diagnosis not present

## 2022-04-18 DIAGNOSIS — T8609 Other complications of bone marrow transplant: Secondary | ICD-10-CM | POA: Diagnosis not present

## 2022-04-18 DIAGNOSIS — K862 Cyst of pancreas: Secondary | ICD-10-CM | POA: Diagnosis not present

## 2022-04-18 DIAGNOSIS — Z8673 Personal history of transient ischemic attack (TIA), and cerebral infarction without residual deficits: Secondary | ICD-10-CM | POA: Diagnosis not present

## 2022-04-18 DIAGNOSIS — Z79899 Other long term (current) drug therapy: Secondary | ICD-10-CM | POA: Diagnosis not present

## 2022-04-18 DIAGNOSIS — B3781 Candidal esophagitis: Secondary | ICD-10-CM | POA: Diagnosis not present

## 2022-04-18 DIAGNOSIS — C9200 Acute myeloblastic leukemia, not having achieved remission: Secondary | ICD-10-CM | POA: Diagnosis not present

## 2022-04-18 DIAGNOSIS — D89811 Chronic graft-versus-host disease: Secondary | ICD-10-CM | POA: Diagnosis not present

## 2022-04-18 DIAGNOSIS — D469 Myelodysplastic syndrome, unspecified: Secondary | ICD-10-CM | POA: Diagnosis not present

## 2022-04-18 DIAGNOSIS — T50905A Adverse effect of unspecified drugs, medicaments and biological substances, initial encounter: Secondary | ICD-10-CM | POA: Diagnosis not present

## 2022-04-18 DIAGNOSIS — Z9484 Stem cells transplant status: Secondary | ICD-10-CM | POA: Diagnosis not present

## 2022-04-27 DIAGNOSIS — E273 Drug-induced adrenocortical insufficiency: Secondary | ICD-10-CM | POA: Diagnosis not present

## 2022-04-27 DIAGNOSIS — Z8709 Personal history of other diseases of the respiratory system: Secondary | ICD-10-CM | POA: Diagnosis not present

## 2022-04-27 DIAGNOSIS — R0600 Dyspnea, unspecified: Secondary | ICD-10-CM | POA: Diagnosis not present

## 2022-04-27 DIAGNOSIS — D89811 Chronic graft-versus-host disease: Secondary | ICD-10-CM | POA: Diagnosis not present

## 2022-04-27 DIAGNOSIS — R0689 Other abnormalities of breathing: Secondary | ICD-10-CM | POA: Diagnosis not present

## 2022-05-14 ENCOUNTER — Other Ambulatory Visit (INDEPENDENT_AMBULATORY_CARE_PROVIDER_SITE_OTHER): Payer: Medicare Other

## 2022-05-14 DIAGNOSIS — E2740 Unspecified adrenocortical insufficiency: Secondary | ICD-10-CM

## 2022-05-14 LAB — CORTISOL: Cortisol, Plasma: 7.3 ug/dL

## 2022-05-15 ENCOUNTER — Encounter: Payer: Self-pay | Admitting: Family Medicine

## 2022-05-15 DIAGNOSIS — E119 Type 2 diabetes mellitus without complications: Secondary | ICD-10-CM | POA: Diagnosis not present

## 2022-05-15 DIAGNOSIS — Z961 Presence of intraocular lens: Secondary | ICD-10-CM | POA: Diagnosis not present

## 2022-05-15 DIAGNOSIS — D89813 Graft-versus-host disease, unspecified: Secondary | ICD-10-CM | POA: Diagnosis not present

## 2022-05-15 DIAGNOSIS — H04123 Dry eye syndrome of bilateral lacrimal glands: Secondary | ICD-10-CM | POA: Diagnosis not present

## 2022-05-15 DIAGNOSIS — H018 Other specified inflammations of eyelid: Secondary | ICD-10-CM | POA: Diagnosis not present

## 2022-05-15 LAB — HM DIABETES EYE EXAM

## 2022-05-21 LAB — EXTRA SPECIMEN

## 2022-05-21 LAB — ACTH

## 2022-05-24 ENCOUNTER — Other Ambulatory Visit: Payer: Medicare Other

## 2022-05-24 NOTE — Addendum Note (Signed)
Addended by: Bing Neighbors on: 05/24/2022 09:12 AM   Modules accepted: Orders

## 2022-05-25 ENCOUNTER — Other Ambulatory Visit: Payer: Medicare Other

## 2022-05-25 DIAGNOSIS — E2740 Unspecified adrenocortical insufficiency: Secondary | ICD-10-CM | POA: Diagnosis not present

## 2022-05-29 LAB — ACTH: C206 ACTH: 32 pg/mL (ref 6–50)

## 2022-07-04 DIAGNOSIS — K862 Cyst of pancreas: Secondary | ICD-10-CM | POA: Diagnosis not present

## 2022-07-04 DIAGNOSIS — R978 Other abnormal tumor markers: Secondary | ICD-10-CM | POA: Diagnosis not present

## 2022-07-04 DIAGNOSIS — D89811 Chronic graft-versus-host disease: Secondary | ICD-10-CM | POA: Diagnosis not present

## 2022-07-04 DIAGNOSIS — D469 Myelodysplastic syndrome, unspecified: Secondary | ICD-10-CM | POA: Diagnosis not present

## 2022-07-04 DIAGNOSIS — K219 Gastro-esophageal reflux disease without esophagitis: Secondary | ICD-10-CM | POA: Diagnosis not present

## 2022-07-04 DIAGNOSIS — T8609 Other complications of bone marrow transplant: Secondary | ICD-10-CM | POA: Diagnosis not present

## 2022-07-04 DIAGNOSIS — D379 Neoplasm of uncertain behavior of digestive organ, unspecified: Secondary | ICD-10-CM | POA: Diagnosis not present

## 2022-07-10 DIAGNOSIS — R112 Nausea with vomiting, unspecified: Secondary | ICD-10-CM | POA: Diagnosis not present

## 2022-07-17 ENCOUNTER — Encounter: Payer: Self-pay | Admitting: Family Medicine

## 2022-07-17 ENCOUNTER — Ambulatory Visit (INDEPENDENT_AMBULATORY_CARE_PROVIDER_SITE_OTHER): Payer: Medicare Other | Admitting: Family Medicine

## 2022-07-17 VITALS — BP 124/78 | HR 80 | Temp 97.5°F | Ht 70.0 in | Wt 233.2 lb

## 2022-07-17 DIAGNOSIS — J029 Acute pharyngitis, unspecified: Secondary | ICD-10-CM | POA: Diagnosis not present

## 2022-07-17 DIAGNOSIS — R739 Hyperglycemia, unspecified: Secondary | ICD-10-CM | POA: Diagnosis not present

## 2022-07-17 DIAGNOSIS — D89813 Graft-versus-host disease, unspecified: Secondary | ICD-10-CM | POA: Diagnosis not present

## 2022-07-17 DIAGNOSIS — E2740 Unspecified adrenocortical insufficiency: Secondary | ICD-10-CM

## 2022-07-17 DIAGNOSIS — Z9484 Stem cells transplant status: Secondary | ICD-10-CM | POA: Diagnosis not present

## 2022-07-17 DIAGNOSIS — T380X5A Adverse effect of glucocorticoids and synthetic analogues, initial encounter: Secondary | ICD-10-CM | POA: Diagnosis not present

## 2022-07-17 DIAGNOSIS — I480 Paroxysmal atrial fibrillation: Secondary | ICD-10-CM

## 2022-07-17 LAB — POC COVID19 BINAXNOW: SARS Coronavirus 2 Ag: NEGATIVE

## 2022-07-17 LAB — POCT RAPID STREP A (OFFICE): Rapid Strep A Screen: NEGATIVE

## 2022-07-17 LAB — HEMOGLOBIN A1C: Hgb A1c MFr Bld: 6.9 % — ABNORMAL HIGH (ref 4.6–6.5)

## 2022-07-17 NOTE — Progress Notes (Signed)
Phone [phone] In person visit   Subjective:   Brandon [name] is a 72 y.o. year old very pleasant male patient who presents for/with See problem oriented charting Chief Complaint  Patient presents with   Follow-up    Pt is here for 14 week f/u   Sore Throat    Pt c/o sore throat and sinus drainage that started.   Past Medical History-  Patient Active Problem List   Diagnosis Date Noted   Allergic rhinitis 10/03/2019    Priority: High   Pancreatic lesion 10/03/2019    Priority: High   History of CVA (cerebrovascular accident) 03/10/2019    Priority: High   Adrenal insufficiency ([place]) 08/06/2017    Priority: High   GVHD (graft versus host disease) ([place]) 03/04/2017    Priority: High   History of myelodysplastic syndrome s/p stem cell transplant in 2017  12/06/2015    Priority: High   Steroid-induced hyperglycemia 08/16/2014    Priority: High   Erectile dysfunction 01/08/2022    Priority: Medium    Fatty liver 01/05/2020    Priority: Medium    Osteopenia 10/05/2019    Priority: Medium    History of DVT (deep vein thrombosis) 09/25/2018    Priority: Medium    Post chemotherapy Dry eyes due to decreased tear production 08/06/2017    Priority: Medium    post chemotherapy Peripheral neuropathy 08/06/2017    Priority: Medium    BPH associated with nocturia 11/15/2014    Priority: Medium    Hyperlipidemia 02/04/2007    Priority: Medium    Essential hypertension 02/04/2007    Priority: Medium    History of stem cell transplant ([place]) 03/04/2017    Priority: Low   Leukopenia 11/15/2014    Priority: Low   Chest pain 08/13/2014    Priority: Low   GERD (gastroesophageal reflux disease) 05/03/2014    Priority: Low   Cervical disc disorder with radiculopathy of cervical region 07/14/2010    Priority: [place] LOSS BILATERAL 07/14/2010    Priority: Low   ERECTILE DYSFUNCTION 02/07/2007    Priority: Low   New onset atrial fibrillation with RVR  02/12/2022    AKI (acute kidney injury) ([place]) 02/12/2022   Leukocytosis 02/12/2022   Steroid-induced diabetes ([place]) 02/12/2022    Medications- reviewed and updated Current Outpatient Medications  Medication Sig Dispense Refill   acetaminophen (TYLENOL) 500 MG tablet Take 500-1,000 mg by mouth every 6 (six) hours as needed for mild pain or headache.     acyclovir (ZOVIRAX) 400 MG tablet Take 400 mg by mouth 2 (two) times daily.     albuterol (VENTOLIN HFA) 108 (90 Base) MCG/ACT inhaler Inhale 2 puffs into the lungs in the morning and at bedtime.     Alpha-Lipoic Acid 600 MG CAPS Take 600 mg by mouth daily with supper.     amLODipine (NORVASC) 10 MG tablet TAKE 1 TABLET BY MOUTH EVERY DAY (Patient taking differently: Take 10 mg by mouth daily.) 90 tablet 3   amoxicillin (AMOXIL) 500 MG capsule Take 2,000 mg by mouth See admin instructions. Take 2,000 mg by mouth one hour prior to dental appointments     apixaban (ELIQUIS) 5 MG TABS tablet Take 1 tablet (5 mg total) by mouth 2 (two) times daily. 60 tablet 3   Carboxymethylcellul-Glycerin (REFRESH RELIEVA OP) Place 1 drop into both eyes 4 (four) times daily.     Cholecalciferol (VITAMIN D) 50 MCG (2000 UT) CAPS Take 2,000 Units by mouth  daily with supper.     diltiazem (CARDIZEM) 30 MG tablet Take 30 mg by mouth daily as needed. For elevated HR     fluticasone (FLONASE) 50 MCG/ACT nasal spray Place 1-2 sprays into both nostrils daily as needed (for seasonal allergies).     hydrocortisone (CORTEF) 20 MG tablet Take 20 mg by mouth as needed (AS DIRECTED- during times of illness or for an adrenal emergency).     Lancets (ONETOUCH DELICA PLUS [id number]) MISC      loratadine (CLARITIN) 10 MG tablet Take 10 mg by mouth daily as needed (for seasonal allergies).     Magnesium Oxide 400 MG CAPS Take 1 capsule (400 mg total) by mouth daily with supper.     metFORMIN (GLUCOPHAGE) 500 MG tablet Take 500 mg by mouth 2 (two) times daily.     metoprolol succinate  (TOPROL-XL) 25 MG 24 hr tablet Take 1 tablet by mouth 2 (two) times daily. Per [facility]- prefers XR BID     montelukast (SINGULAIR) 10 MG tablet Take 10 mg by mouth at bedtime.     Multiple Vitamin (MULTI-VITAMIN) tablet Take 1 tablet by mouth daily with breakfast.     niacinamide 500 MG tablet Take 500 mg by mouth 2 (two) times daily with a meal.     omeprazole (PRILOSEC) 10 MG capsule TAKE 1 CAPSULE BY MOUTH EVERY DAY (Patient taking differently: Take 40 mg by mouth daily before breakfast.) 90 capsule 1   ondansetron (ZOFRAN-ODT) 8 MG disintegrating tablet Take 8 mg by mouth every 8 (eight) hours as needed for nausea.     polyethylene glycol (MIRALAX / GLYCOLAX) 17 g packet Take 17 g by mouth See admin instructions. Mix 17 grams of powder into 4-8 ounces of water and drink one to two times a week     PRESCRIPTION MEDICATION Place 1 application  onto teeth See admin instructions. Sodium Fluoride 5000 dry mouth toothpaste- Brush teeth for 2 minutes using the toothpaste daily as directed     PULMICORT FLEXHALER 180 MCG/ACT inhaler Inhale 1 puff into the lungs in the morning and at bedtime.     rosuvastatin (CRESTOR) 40 MG tablet TAKE 1 TABLET BY MOUTH EVERY DAY 90 tablet 3   senna-docusate (SENOKOT-S) 8.6-50 MG tablet Take 2 tablets by mouth at bedtime.      sildenafil (VIAGRA) 100 MG tablet Take 1 tablet (100 mg total) by mouth daily as needed for erectile dysfunction. 30 tablet 3   sodium chloride (OCEAN) 0.65 % SOLN nasal spray Place 1-2 sprays into both nostrils in the morning and at bedtime.     venlafaxine (EFFEXOR) 25 MG tablet Take 25 mg by mouth 2 (two) times daily.     No current facility-administered medications for this visit.     Objective:  BP 124/78   Pulse 80   Temp (!) 97.5 F (36.4 C)   Ht '5\' 10"'$  (1.778 m)   Wt 233 lb 3.2 oz (105.8 kg)   SpO2 96%   BMI 33.46 kg/m  Gen: NAD, resting comfortably CV: RRR no murmurs rubs or gallops Lungs: CTAB no crackles, wheeze,  rhonchi Abdomen: soft/nontender/nondistended/normal bowel sounds.  Ext: minimal edema Skin: warm, dry  Results for orders placed or performed in visit on 07/17/22 (from the past 24 hour(s))  POC COVID-19     Status: None   Collection Time: 07/17/22  9:12 AM  Result Value Ref Range   SARS Coronavirus 2 Ag Negative Negative  POCT rapid strep A  Status: None   Collection Time: 07/17/22  9:12 AM  Result Value Ref Range   Rapid Strep A Screen Negative Negative       Assessment and Plan   #HM- plans to do RSV when he is doing better  # Sore throat and sinus drainage S: Patient reports symptoms started on Saturday. Worse in the morning with drainage and then tends to improve. Thick green brown.  Saturday subjective fever. Has not increased hydrocortisone other than that one day- took tylenol Sunday and hasn't needed since. Feels like he is improving.   A/P: he states overall mild issues and improving- wants to monitor but if fails to continue to improve or worsens he will let me know ASAP - would likely use augmentin as tends to be sinus focused  # Pancreatic cyst-follow up MRI of the abd- pancreas focus- now followed at [place] -Stable on 07/04/2022. Plan is for 1 year imaging unless bloodwork worsens in 6 months.   #History of myelodysplastic syndrome that transitioned to AML history-has continued to do well and now over 6 years out from stem cell transplant. Continues follow up with Dr. Corena Pilgrim  #Graft-versus-host disease-on Singulair and Pulmicort- long term plan- [facility] says stable   % Adrenal insufficiency-follows with [facility] endocrinology Dr. Laurine Blazer on hydrocortisone 12.5 mg in the morning and 7.5  mg in the evening (just went down last week on evening dose)  -Nausea issues could be related-on ondansetron and has seen due to GI -overall stable and continues to try to wean with new endocrinologist  -since he has dropped his dose he is going to recheck acth and  cortisol fasting first thing in AM after skipping afternoon dose day before and morning dose of- then we can forward these to [facility] endocrinology  # Atrial fibrillation-new onset August 2023- follows at [facility] #history of stroke prior to detection- thankfully mild S: Rate controlled with metoprolol 25 mg twice daily (insurance doesn't cover but may split 50 mg in half- wants to check with pharmacy, diltiazem 30 mg daily as needed for elevated heart rate Anticoagulated with Eliquis 5 mg twice daily A/P: appropriately anticoagulated and rate controlled- continue current medicine   #hypertension S: medication: Amlodipine 10 mg, metoprolol 25 mg twice daily Home readings #s: 120s over 70s high or 80s low   BP Readings from Last 3 Encounters:  07/17/22 124/78  04/12/22 100/72  02/13/22 122/86  A/P: stable- continue current medicines   #hyperlipidemia S: Medication:Rosuvastatin 40 mg  Lab Results  Component Value Date   CHOL 132 08/30/2021   HDL 50 08/30/2021   LDLCALC 48 08/30/2021   LDLDIRECT 77 02/17/2020   TRIG 172 (A) 08/30/2021   CHOLHDL 2.9 08/22/2020   A/P: at goal last visit- update next visit- continue current medications    #Steroid induced hyperglycemia S: Medication:Metformin 500 mg twice daily--> '1000mg'$  twice daily- increased a month ago CBGs- sugars havent checked a lot lately Exercise and diet- not exercising but encouraged   Lab Results  Component Value Date   HGBA1C 6.5 04/06/2022   HGBA1C 6.5 12/29/2021   HGBA1C 6.2 (A) 10/11/2021  A/P: hopefully stable- update a1c today. Continue current meds for now  #/Depressed mood S: Medication: Venlafaxine/Effexor 25 mg twice daily per oncology team - does not note significant improvement but still feels lack of motivation  A/P: overall stable- denies depressed mood- mainly just motivation issues still-     # GERD S:Medication:  omeprazole 10 mg in past but recent increase per  [facility] to 40 mg and see how he does with  steroid reduction with nausea A/P: recent adjustment- we will see how he does and advised follow up with GI   Recommended follow up: Return in about 4 months (around 11/15/2022) for followup or sooner if needed.Schedule b4 you leave. Future Appointments  Date Time Provider [place]  01/07/2023  8:45 AM [facility] HEALTH COACH [facility]   Lab/Order associations:   ICD-10-CM   1. Sore throat  J02.9 POCT rapid strep A    POC COVID-19    2. Adrenal insufficiency (HCC)  E27.40 ACTH    Cortisol    3. GVHD (graft versus host disease) ([place])  D89.813     4. History of stem cell transplant ([place]) Chronic Z94.84     5. Paroxysmal atrial fibrillation (HCC) Chronic I48.0     6. Steroid-induced hyperglycemia  R73.9 HgB A1c   T38.0X5A       No orders of the defined types were placed in this encounter.   Return precautions advised.  Garret Reddish, MD

## 2022-07-17 NOTE — Patient Instructions (Addendum)
Sign release of information at the check out desk for diabetic eye exam.  he states overall mild sinus  issues and improving- wants to monitor but if fails to continue to improve or worsens he will let me know ASAP - would likely use augmentin as tends to be sinus focused  For the month of February- 3 days a week exercise marked down on calendar or app - for 10-20 minutes for a total of 12 sessions in the month.   Schedule labs in about a month to check acth and cortisol  Please stop by lab before you go If you have mychart- we will send your results within 3 business days of [place] receiving them.  If you do not have mychart- we will call you about results within 5 business days of [place] receiving them.  *please also note that you will see labs on mychart as soon as they post. I will later go in and write notes on them- will say "notes from Dr. Yong Channel"    Recommended follow up: Return in about 4 months (around 11/15/2022) for followup or sooner if needed.Schedule b4 you leave.

## 2022-07-27 DIAGNOSIS — Z794 Long term (current) use of insulin: Secondary | ICD-10-CM | POA: Diagnosis not present

## 2022-07-27 DIAGNOSIS — R739 Hyperglycemia, unspecified: Secondary | ICD-10-CM | POA: Diagnosis not present

## 2022-07-27 DIAGNOSIS — E1165 Type 2 diabetes mellitus with hyperglycemia: Secondary | ICD-10-CM | POA: Diagnosis not present

## 2022-07-27 DIAGNOSIS — M8580 Other specified disorders of bone density and structure, unspecified site: Secondary | ICD-10-CM | POA: Diagnosis not present

## 2022-07-27 DIAGNOSIS — T380X5A Adverse effect of glucocorticoids and synthetic analogues, initial encounter: Secondary | ICD-10-CM | POA: Diagnosis not present

## 2022-07-27 DIAGNOSIS — D469 Myelodysplastic syndrome, unspecified: Secondary | ICD-10-CM | POA: Diagnosis not present

## 2022-07-27 DIAGNOSIS — E2740 Unspecified adrenocortical insufficiency: Secondary | ICD-10-CM | POA: Diagnosis not present

## 2022-07-27 DIAGNOSIS — Z5181 Encounter for therapeutic drug level monitoring: Secondary | ICD-10-CM | POA: Diagnosis not present

## 2022-07-27 DIAGNOSIS — Z9484 Stem cells transplant status: Secondary | ICD-10-CM | POA: Diagnosis not present

## 2022-08-28 ENCOUNTER — Encounter: Payer: Self-pay | Admitting: Family Medicine

## 2022-08-28 NOTE — Telephone Encounter (Signed)
See below

## 2022-08-29 ENCOUNTER — Other Ambulatory Visit: Payer: Self-pay

## 2022-08-29 DIAGNOSIS — R739 Hyperglycemia, unspecified: Secondary | ICD-10-CM

## 2022-08-29 DIAGNOSIS — E2740 Unspecified adrenocortical insufficiency: Secondary | ICD-10-CM

## 2022-08-30 ENCOUNTER — Other Ambulatory Visit (INDEPENDENT_AMBULATORY_CARE_PROVIDER_SITE_OTHER): Payer: Medicare Other

## 2022-08-30 DIAGNOSIS — R739 Hyperglycemia, unspecified: Secondary | ICD-10-CM

## 2022-08-30 DIAGNOSIS — E2740 Unspecified adrenocortical insufficiency: Secondary | ICD-10-CM

## 2022-08-30 DIAGNOSIS — T380X5A Adverse effect of glucocorticoids and synthetic analogues, initial encounter: Secondary | ICD-10-CM | POA: Diagnosis not present

## 2022-08-30 LAB — RENAL FUNCTION PANEL
Albumin: 3.8 g/dL (ref 3.5–5.2)
BUN: 15 mg/dL (ref 6–23)
CO2: 26 mEq/L (ref 19–32)
Calcium: 10.1 mg/dL (ref 8.4–10.5)
Chloride: 102 mEq/L (ref 96–112)
Creatinine, Ser: 1.09 mg/dL (ref 0.40–1.50)
GFR: 67.57 mL/min (ref >60.00)
Glucose, Bld: 97 mg/dL (ref 70–99)
Phosphorus: 3.4 mg/dL (ref 2.3–4.6)
Potassium: 4.3 mEq/L (ref 3.5–5.1)
Sodium: 136 mEq/L (ref 135–145)

## 2022-08-30 LAB — CORTISOL: Cortisol, Plasma: 8.1 ug/dL

## 2022-09-03 LAB — ACTH: C206 ACTH: 36 pg/mL (ref 6–50)

## 2022-09-03 LAB — C-PEPTIDE: C-Peptide: 3.67 ng/mL (ref 0.80–3.85)

## 2022-10-02 DIAGNOSIS — T50905A Adverse effect of unspecified drugs, medicaments and biological substances, initial encounter: Secondary | ICD-10-CM | POA: Diagnosis not present

## 2022-10-02 DIAGNOSIS — E782 Mixed hyperlipidemia: Secondary | ICD-10-CM | POA: Diagnosis not present

## 2022-10-02 DIAGNOSIS — Z9481 Bone marrow transplant status: Secondary | ICD-10-CM | POA: Diagnosis not present

## 2022-10-02 DIAGNOSIS — I10 Essential (primary) hypertension: Secondary | ICD-10-CM | POA: Diagnosis not present

## 2022-10-02 DIAGNOSIS — Z9484 Stem cells transplant status: Secondary | ICD-10-CM | POA: Diagnosis not present

## 2022-10-02 DIAGNOSIS — R739 Hyperglycemia, unspecified: Secondary | ICD-10-CM | POA: Diagnosis not present

## 2022-10-02 DIAGNOSIS — I480 Paroxysmal atrial fibrillation: Secondary | ICD-10-CM | POA: Diagnosis not present

## 2022-10-02 DIAGNOSIS — D469 Myelodysplastic syndrome, unspecified: Secondary | ICD-10-CM | POA: Diagnosis not present

## 2022-10-17 DIAGNOSIS — T8609 Other complications of bone marrow transplant: Secondary | ICD-10-CM | POA: Diagnosis not present

## 2022-10-17 DIAGNOSIS — Z9484 Stem cells transplant status: Secondary | ICD-10-CM | POA: Diagnosis not present

## 2022-10-17 DIAGNOSIS — E782 Mixed hyperlipidemia: Secondary | ICD-10-CM | POA: Diagnosis not present

## 2022-10-17 DIAGNOSIS — D469 Myelodysplastic syndrome, unspecified: Secondary | ICD-10-CM | POA: Diagnosis not present

## 2022-10-17 DIAGNOSIS — D89811 Chronic graft-versus-host disease: Secondary | ICD-10-CM | POA: Diagnosis not present

## 2022-10-17 DIAGNOSIS — Z9481 Bone marrow transplant status: Secondary | ICD-10-CM | POA: Diagnosis not present

## 2022-12-04 ENCOUNTER — Ambulatory Visit (INDEPENDENT_AMBULATORY_CARE_PROVIDER_SITE_OTHER): Payer: Medicare Other | Admitting: Family Medicine

## 2022-12-04 ENCOUNTER — Encounter: Payer: Self-pay | Admitting: Family Medicine

## 2022-12-04 VITALS — BP 124/74 | HR 75 | Temp 97.5°F | Ht 70.0 in | Wt 228.0 lb

## 2022-12-04 DIAGNOSIS — T380X5A Adverse effect of glucocorticoids and synthetic analogues, initial encounter: Secondary | ICD-10-CM

## 2022-12-04 DIAGNOSIS — I4891 Unspecified atrial fibrillation: Secondary | ICD-10-CM

## 2022-12-04 DIAGNOSIS — R739 Hyperglycemia, unspecified: Secondary | ICD-10-CM | POA: Diagnosis not present

## 2022-12-04 DIAGNOSIS — E2740 Unspecified adrenocortical insufficiency: Secondary | ICD-10-CM | POA: Diagnosis not present

## 2022-12-04 DIAGNOSIS — I10 Essential (primary) hypertension: Secondary | ICD-10-CM | POA: Diagnosis not present

## 2022-12-04 MED ORDER — VENLAFAXINE HCL ER 75 MG PO CP24
75.0000 mg | ORAL_CAPSULE | Freq: Every day | ORAL | 5 refills | Status: DC
Start: 2022-12-04 — End: 2022-12-26

## 2022-12-04 NOTE — Patient Instructions (Addendum)
Send [place] the name of your eye doctor at [facility] via [place] so we can request your Diabetic Eye Exam.  sugars trending up some- continue metformin but want him to increase his exercise back up as this is like an additional medicine for diabetes   mild poor control of anxiety and down mood/motivation mainly- we opted to increase venlafaxine to 75 mg but in extended release version so only has to take once a day   Recommended follow up: Return in about 5 months (around 05/06/2023) for followup or sooner if needed.Schedule b4 you leave.

## 2022-12-04 NOTE — Progress Notes (Signed)
Phone [phone] In person visit   Subjective:   Brandon [name] is a 72 y.o. year old very pleasant male patient who presents for/with See problem oriented charting Chief Complaint  Patient presents with   Medical Management of Chronic Issues   Hyperlipidemia   Hypertension   Past Medical History-  Patient Active Problem List   Diagnosis Date Noted   Atrial fibrillation (HCC) 02/12/2022    Priority: High   Allergic rhinitis 10/03/2019    Priority: High   Pancreatic lesion 10/03/2019    Priority: High   History of CVA (cerebrovascular accident) 03/10/2019    Priority: High   Adrenal insufficiency (HCC) 08/06/2017    Priority: High   GVHD (graft versus host disease) (HCC) 03/04/2017    Priority: High   History of myelodysplastic syndrome s/p stem cell transplant in 2017  12/06/2015    Priority: High   Steroid-induced hyperglycemia 08/16/2014    Priority: High   Erectile dysfunction 01/08/2022    Priority: Medium    Fatty liver 01/05/2020    Priority: Medium    Osteopenia 10/05/2019    Priority: Medium    History of DVT (deep vein thrombosis) 09/25/2018    Priority: Medium    Post chemotherapy Dry eyes due to decreased tear production 08/06/2017    Priority: Medium    post chemotherapy Peripheral neuropathy 08/06/2017    Priority: Medium    BPH associated with nocturia 11/15/2014    Priority: Medium    Hyperlipidemia 02/04/2007    Priority: Medium    Essential hypertension 02/04/2007    Priority: Medium    History of stem cell transplant (HCC) 03/04/2017    Priority: Low   Leukopenia 11/15/2014    Priority: Low   Chest pain 08/13/2014    Priority: Low   GERD (gastroesophageal reflux disease) 05/03/2014    Priority: Low   Cervical disc disorder with radiculopathy of cervical region 07/14/2010    Priority: Low   MIXED HEARING LOSS BILATERAL 07/14/2010    Priority: Low   ERECTILE DYSFUNCTION 02/07/2007    Priority: Low   AKI (acute kidney injury) (HCC)  02/12/2022   Leukocytosis 02/12/2022    Medications- reviewed and updated Current Outpatient Medications  Medication Sig Dispense Refill   acetaminophen (TYLENOL) 500 MG tablet Take 500-1,000 mg by mouth every 6 (six) hours as needed for mild pain or headache.     albuterol (VENTOLIN HFA) 108 (90 Base) MCG/ACT inhaler Inhale 2 puffs into the lungs in the morning and at bedtime.     Alpha-Lipoic Acid 600 MG CAPS Take 600 mg by mouth daily with supper.     amLODipine (NORVASC) 10 MG tablet TAKE 1 TABLET BY MOUTH EVERY DAY (Patient taking differently: Take 10 mg by mouth daily.) 90 tablet 3   amoxicillin (AMOXIL) 500 MG capsule Take 2,000 mg by mouth See admin instructions. Take 2,000 mg by mouth one hour prior to dental appointments     apixaban (ELIQUIS) 5 MG TABS tablet Take 1 tablet (5 mg total) by mouth 2 (two) times daily. 60 tablet 3   Carboxymethylcellul-Glycerin (REFRESH RELIEVA OP) Place 1 drop into both eyes 4 (four) times daily.     Cholecalciferol (VITAMIN D) 50 MCG (2000 UT) CAPS Take 2,000 Units by mouth daily with supper.     diltiazem (CARDIZEM) 30 MG tablet Take 30 mg by mouth daily as needed. For elevated HR     fluticasone (FLONASE) 50 MCG/ACT nasal spray Place 1-2 sprays into both nostrils  daily as needed (for seasonal allergies).     hydrocortisone (CORTEF) 20 MG tablet Take 15 mg by mouth daily.     Lancets (ONETOUCH DELICA PLUS LANCET33G) MISC      loratadine (CLARITIN) 10 MG tablet Take 10 mg by mouth daily as needed (for seasonal allergies).     Magnesium Oxide 400 MG CAPS Take 1 capsule (400 mg total) by mouth daily with supper.     metFORMIN (GLUCOPHAGE) 500 MG tablet Take 1,000 mg by mouth 2 (two) times daily.     metoprolol succinate (TOPROL-XL) 25 MG 24 hr tablet Take 2 tablets by mouth 2 (two) times daily. Per [facility]- now on 50 mg once daily XR     montelukast (SINGULAIR) 10 MG tablet Take 10 mg by mouth at bedtime.     Multiple Vitamin (MULTI-VITAMIN)  tablet Take 1 tablet by mouth daily with breakfast.     niacinamide 500 MG tablet Take 500 mg by mouth 2 (two) times daily with a meal.     omeprazole (PRILOSEC) 10 MG capsule TAKE 1 CAPSULE BY MOUTH EVERY DAY (Patient taking differently: Take 40 mg by mouth daily before breakfast.) 90 capsule 1   polyethylene glycol (MIRALAX / GLYCOLAX) 17 g packet Take 17 g by mouth See admin instructions. Mix 17 grams of powder into 4-8 ounces of water and drink one to two times a week     PRESCRIPTION MEDICATION Place 1 application  onto teeth See admin instructions. Sodium Fluoride 5000 dry mouth toothpaste- Brush teeth for 2 minutes using the toothpaste daily as directed     PULMICORT FLEXHALER 180 MCG/ACT inhaler Inhale 1 puff into the lungs in the morning and at bedtime.     rosuvastatin (CRESTOR) 40 MG tablet TAKE 1 TABLET BY MOUTH EVERY DAY 90 tablet 3   senna-docusate (SENOKOT-S) 8.6-50 MG tablet Take 2 tablets by mouth at bedtime.      sildenafil (VIAGRA) 100 MG tablet Take 1 tablet (100 mg total) by mouth daily as needed for erectile dysfunction. 30 tablet 3   sodium chloride (OCEAN) 0.65 % SOLN nasal spray Place 1-2 sprays into both nostrils in the morning and at bedtime.     venlafaxine XR (EFFEXOR XR) 75 MG 24 hr capsule Take 1 capsule (75 mg total) by mouth daily with breakfast. 30 capsule 5   ondansetron (ZOFRAN-ODT) 8 MG disintegrating tablet Take 8 mg by mouth every 8 (eight) hours as needed for nausea. (Patient not taking: Reported on 12/04/2022)     No current facility-administered medications for this visit.     Objective:  BP 124/74   Pulse 75   Temp (!) 97.5 F (36.4 C)   Ht 5\' 10"  (1.778 m)   Wt 228 lb (103.4 kg)   SpO2 95%   BMI 32.71 kg/m  Gen: NAD, resting comfortably CV: RRR no murmurs rubs or gallops Lungs: CTAB no crackles, wheeze, rhonchi Ext: trace edema Skin: warm, dry    Assessment and Plan   # Pancreatic cyst-follow up MRI of the abd- pancreas focus- now followed  at [facility] -Stable on 07/04/2022 with 1 year imaging and 6 month bloodwork as long as stable   #History of myelodysplastic syndrome that transitioned to AML history-has continued to do well and now over 6 years out from stem cell transplant - closing in on 7 years   % Adrenal insufficiency-follows with [facility] endocrinology Dr. Enrigue Catena on hydrocortisone 10 mg in the morning and 5 mg in the evening  -  Nausea issues could be related-on ondansetron and has seen due to gastroenterology- doing better lately other than with recent reduction -doing well with reduction- continue current medications    # Paroxysmal Atrial fibrillation-new onset August 2023- follows at [facility] #history of stroke prior to detection- thankfully mild S: Rate controlled with metoprolol 25 mg twice daily, diltiazem 30 mg daily as needed for elevated heart rate Anticoagulated with Eliquis 5 mg twice daily A/P: appropriately anticoagulated and rate controlled (just paroxysmal and no recent issues) - continue current medicine     #hypertension S: medication: Amlodipine 10 mg, metoprolol 25 mg twice daily BP Readings from Last 3 Encounters:  12/04/22 124/74  07/17/22 124/78  04/12/22 100/72  A/P: stable- continue current medicines   #hyperlipidemia S: Medication:Rosuvastatin 40 mg A/P: was just checked at [facility] and LDL under 50- well controlled with strok ehistory- continue current medications    #Steroid induced hyperglycemia S: Medication: Metformin 1000 mg twice daily CBGs- capillary blood glucose trending up from 90's to 110s or so with exercise slowing -does 2 miles walking with a buddy once a week. Considering gym Lab Results  Component Value Date   HGBA1C 6.9 (H) 07/17/2022   HGBA1C 6.5 04/06/2022   HGBA1C 6.5 12/29/2021  A/P: sugars trending up some- continue metformin but want him to increase his exercise back up as this is like an additional medicine for diabetes   #/Depressed mood S: Medication:  Venlafaxine/Effexor 25 mg twice daily per oncology team  Low motivation still. No depressed mood. Will get to exercising but then slows back down- multiple life stressors- worried about son and wife A/P: mild poor control of anxiety and down mood/motivation mainly- we opted to increase venlafaxine to 75 mg but in extended release version so only has to take once a day    Recommended follow up: Return in about 5 months (around 05/06/2023) for followup or sooner if needed.Schedule b4 you leave. Future Appointments  Date Time Provider Department Center  01/07/2023  8:30 AM [facility] ANNUAL WELLNESS VISIT 1 [facility]    Lab/Order associations:   ICD-10-CM   1. Steroid-induced hyperglycemia  R73.9    T38.0X5A     2. Adrenal insufficiency (HCC)  E27.40     3. Atrial fibrillation, unspecified type (HCC)  I48.91     4. Essential hypertension  I10       Meds ordered this encounter  Medications   venlafaxine XR (EFFEXOR XR) 75 MG 24 hr capsule    Sig: Take 1 capsule (75 mg total) by mouth daily with breakfast.    Dispense:  30 capsule    Refill:  5    Return precautions advised.  Tana Conch, MD

## 2022-12-06 ENCOUNTER — Encounter: Payer: Self-pay | Admitting: Ophthalmology

## 2022-12-14 DIAGNOSIS — K862 Cyst of pancreas: Secondary | ICD-10-CM | POA: Diagnosis not present

## 2022-12-14 DIAGNOSIS — E2740 Unspecified adrenocortical insufficiency: Secondary | ICD-10-CM | POA: Diagnosis not present

## 2022-12-14 DIAGNOSIS — Z7952 Long term (current) use of systemic steroids: Secondary | ICD-10-CM | POA: Diagnosis not present

## 2022-12-14 DIAGNOSIS — R978 Other abnormal tumor markers: Secondary | ICD-10-CM | POA: Diagnosis not present

## 2022-12-14 DIAGNOSIS — E119 Type 2 diabetes mellitus without complications: Secondary | ICD-10-CM | POA: Diagnosis not present

## 2022-12-25 ENCOUNTER — Encounter: Payer: Self-pay | Admitting: Family Medicine

## 2022-12-25 NOTE — Telephone Encounter (Signed)
See below, ok to order under your name?

## 2022-12-26 ENCOUNTER — Other Ambulatory Visit: Payer: Self-pay

## 2022-12-26 ENCOUNTER — Other Ambulatory Visit: Payer: Self-pay | Admitting: Family Medicine

## 2022-12-26 DIAGNOSIS — E1169 Type 2 diabetes mellitus with other specified complication: Secondary | ICD-10-CM

## 2022-12-26 DIAGNOSIS — E2740 Unspecified adrenocortical insufficiency: Secondary | ICD-10-CM

## 2023-01-02 ENCOUNTER — Other Ambulatory Visit (INDEPENDENT_AMBULATORY_CARE_PROVIDER_SITE_OTHER): Payer: Medicare Other

## 2023-01-02 DIAGNOSIS — E2740 Unspecified adrenocortical insufficiency: Secondary | ICD-10-CM

## 2023-01-02 DIAGNOSIS — E1169 Type 2 diabetes mellitus with other specified complication: Secondary | ICD-10-CM | POA: Diagnosis not present

## 2023-01-02 LAB — RENAL FUNCTION PANEL
Albumin: 4 g/dL (ref 3.5–5.2)
BUN: 17 mg/dL (ref 6–23)
CO2: 28 mEq/L (ref 19–32)
Calcium: 9.8 mg/dL (ref 8.4–10.5)
Chloride: 101 mEq/L (ref 96–112)
Creatinine, Ser: 1.2 mg/dL (ref 0.40–1.50)
GFR: 60.06 mL/min (ref >60.00)
Glucose, Bld: 105 mg/dL — ABNORMAL HIGH (ref 70–99)
Phosphorus: 3.7 mg/dL (ref 2.3–4.6)
Potassium: 4.6 mEq/L (ref 3.5–5.1)
Sodium: 137 mEq/L (ref 135–145)

## 2023-01-02 LAB — HEMOGLOBIN A1C: Hgb A1c MFr Bld: 6.7 % — ABNORMAL HIGH (ref 4.6–6.5)

## 2023-01-02 LAB — CORTISOL: Cortisol, Plasma: 10.6 ug/dL

## 2023-01-05 LAB — ACTH: C206 ACTH: 36 pg/mL (ref 6–50)

## 2023-01-07 ENCOUNTER — Ambulatory Visit (INDEPENDENT_AMBULATORY_CARE_PROVIDER_SITE_OTHER): Payer: Medicare Other

## 2023-01-07 VITALS — Wt 228.0 lb

## 2023-01-07 DIAGNOSIS — Z0000 Encounter for general adult medical examination without abnormal findings: Secondary | ICD-10-CM | POA: Diagnosis not present

## 2023-01-07 NOTE — Progress Notes (Signed)
 I have reviewed and agree with note, evaluation, plan.   Stephen Hunter, MD  

## 2023-01-07 NOTE — Patient Instructions (Signed)
Brandon [name] , Thank you for taking time to come for your Medicare Wellness Visit. I appreciate your ongoing commitment to your health goals. Please review the following plan we discussed and let me know if I can assist you in the future.   These are the goals we discussed:  Goals      Exercise 150 min/wk Moderate Activity     [facility] team has asked him to walk Has a treadmill at home Walk outside where you enjoy or have a passion. May find a buddy Set a goal      Patient Stated     Lose weight      Patient Stated     Stay healthy         This is a list of the screening recommended for you and due dates:  Health Maintenance  Topic Date Due   COVID-19 Vaccine (8 - 2023-24 season) 05/18/2022   Flu Shot  01/24/2023   Colon Cancer Screening  11/19/2023   Medicare Annual Wellness Visit  01/07/2024   DEXA scan (bone density measurement)  04/16/2025   DTaP/Tdap/Td vaccine (6 - Td or Tdap) 08/26/2028   Pneumonia Vaccine  Completed   Hepatitis C Screening  Completed   Zoster (Shingles) Vaccine  Completed   HPV Vaccine  Aged Out    Advanced directives: Please bring a copy of your health care power of attorney and living will to the office at your convenience.  Conditions/risks identified: stay healthy   Next appointment: Follow up in one year for your annual wellness visit.   Preventive Care 3 Years and Older, Male  Preventive care refers to lifestyle choices and visits with your health care provider that can promote health and wellness. What does preventive care include? A yearly physical exam. This is also called an annual well check. Dental exams once or twice a year. Routine eye exams. Ask your health care provider how often you should have your eyes checked. Personal lifestyle choices, including: Daily care of your teeth and gums. Regular physical activity. Eating a healthy diet. Avoiding tobacco and drug use. Limiting alcohol use. Practicing safe sex. Taking low  doses of aspirin every day. Taking vitamin and mineral supplements as recommended by your health care provider. What happens during an annual well check? The services and screenings done by your health care provider during your annual well check will depend on your age, overall health, lifestyle risk factors, and family history of disease. Counseling  Your health care provider may ask you questions about your: Alcohol use. Tobacco use. Drug use. Emotional well-being. Home and relationship well-being. Sexual activity. Eating habits. History of falls. Memory and ability to understand (cognition). Work and work Astronomer. Screening  You may have the following tests or measurements: Height, weight, and BMI. Blood pressure. Lipid and cholesterol levels. These may be checked every 5 years, or more frequently if you are over 79 years old. Skin check. Lung cancer screening. You may have this screening every year starting at age 6 if you have a 30-pack-year history of smoking and currently smoke or have quit within the past 15 years. Fecal occult blood test (FOBT) of the stool. You may have this test every year starting at age 51. Flexible sigmoidoscopy or colonoscopy. You may have a sigmoidoscopy every 5 years or a colonoscopy every 10 years starting at age 48. Prostate cancer screening. Recommendations will vary depending on your family history and other risks. Hepatitis C blood test. Hepatitis B blood test.  Sexually transmitted disease (STD) testing. Diabetes screening. This is done by checking your blood sugar (glucose) after you have not eaten for a while (fasting). You may have this done every 1-3 years. Abdominal aortic aneurysm (AAA) screening. You may need this if you are a current or former smoker. Osteoporosis. You may be screened starting at age 16 if you are at high risk. Talk with your health care provider about your test results, treatment options, and if necessary, the need  for more tests. Vaccines  Your health care provider may recommend certain vaccines, such as: Influenza vaccine. This is recommended every year. Tetanus, diphtheria, and acellular pertussis (Tdap, Td) vaccine. You may need a Td booster every 10 years. Zoster vaccine. You may need this after age 49. Pneumococcal 13-valent conjugate (PCV13) vaccine. One dose is recommended after age 103. Pneumococcal polysaccharide (PPSV23) vaccine. One dose is recommended after age 30. Talk to your health care provider about which screenings and vaccines you need and how often you need them. This information is not intended to replace advice given to you by your health care provider. Make sure you discuss any questions you have with your health care provider. Document Released: 07/08/2015 Document Revised: 02/29/2016 Document Reviewed: 04/12/2015 Elsevier Interactive Patient Education  2017 [employer].  Fall Prevention in the Home Falls can cause injuries. They can happen to people of all ages. There are many things you can do to make your home safe and to help prevent falls. What can I do on the outside of my home? Regularly fix the edges of walkways and driveways and fix any cracks. Remove anything that might make you trip as you walk through a door, such as a raised step or threshold. Trim any bushes or trees on the path to your home. Use bright outdoor lighting. Clear any walking paths of anything that might make someone trip, such as rocks or tools. Regularly check to see if handrails are loose or broken. Make sure that both sides of any steps have handrails. Any raised decks and porches should have guardrails on the edges. Have any leaves, snow, or ice cleared regularly. Use sand or salt on walking paths during winter. Clean up any spills in your garage right away. This includes oil or grease spills. What can I do in the bathroom? Use night lights. Install grab bars by the toilet and in the tub and  shower. Do not use towel bars as grab bars. Use non-skid mats or decals in the tub or shower. If you need to sit down in the shower, use a plastic, non-slip stool. Keep the floor dry. Clean up any water that spills on the floor as soon as it happens. Remove soap buildup in the tub or shower regularly. Attach bath mats securely with double-sided non-slip rug tape. Do not have throw rugs and other things on the floor that can make you trip. What can I do in the bedroom? Use night lights. Make sure that you have a light by your bed that is easy to reach. Do not use any sheets or blankets that are too big for your bed. They should not hang down onto the floor. Have a firm chair that has side arms. You can use this for support while you get dressed. Do not have throw rugs and other things on the floor that can make you trip. What can I do in the kitchen? Clean up any spills right away. Avoid walking on wet floors. Keep items that you  use a lot in easy-to-reach places. If you need to reach something above you, use a strong step stool that has a grab bar. Keep electrical cords out of the way. Do not use floor polish or wax that makes floors slippery. If you must use wax, use non-skid floor wax. Do not have throw rugs and other things on the floor that can make you trip. What can I do with my stairs? Do not leave any items on the stairs. Make sure that there are handrails on both sides of the stairs and use them. Fix handrails that are broken or loose. Make sure that handrails are as long as the stairways. Check any carpeting to make sure that it is firmly attached to the stairs. Fix any carpet that is loose or worn. Avoid having throw rugs at the top or bottom of the stairs. If you do have throw rugs, attach them to the floor with carpet tape. Make sure that you have a light switch at the top of the stairs and the bottom of the stairs. If you do not have them, ask someone to add them for  you. What else can I do to help prevent falls? Wear shoes that: Do not have high heels. Have rubber bottoms. Are comfortable and fit you well. Are closed at the toe. Do not wear sandals. If you use a stepladder: Make sure that it is fully opened. Do not climb a closed stepladder. Make sure that both sides of the stepladder are locked into place. Ask someone to hold it for you, if possible. Clearly mark and make sure that you can see: Any grab bars or handrails. First and last steps. Where the edge of each step is. Use tools that help you move around (mobility aids) if they are needed. These include: Canes. Walkers. Scooters. Crutches. Turn on the lights when you go into a dark area. Replace any light bulbs as soon as they burn out. Set up your furniture so you have a clear path. Avoid moving your furniture around. If any of your floors are uneven, fix them. If there are any pets around you, be aware of where they are. Review your medicines with your doctor. Some medicines can make you feel dizzy. This can increase your chance of falling. Ask your doctor what other things that you can do to help prevent falls. This information is not intended to replace advice given to you by your health care provider. Make sure you discuss any questions you have with your health care provider. Document Released: 04/07/2009 Document Revised: 11/17/2015 Document Reviewed: 07/16/2014 Elsevier Interactive Patient Education  2017 [employer].

## 2023-01-07 NOTE — Progress Notes (Signed)
Subjective:   Brandon [name] is a 72 y.o. male who presents for Medicare Annual/Subsequent preventive examination.  Visit Complete: Virtual  I connected with  Brandon [name] on 01/07/23 by a audio enabled telemedicine application and verified that I am speaking with the correct person using two identifiers.  Patient Location: Home  Provider Location: Office/Clinic  I discussed the limitations of evaluation and management by telemedicine. The patient expressed understanding and agreed to proceed.  Patient Medicare AWV questionnaire was completed by the patient on 01/03/23; I have confirmed that all information answered by patient is correct and no changes since this date.  Review of Systems     Cardiac Risk Factors include: advanced age (>16men, >71 women);dyslipidemia;hypertension;male gender;obesity (BMI >30kg/m2)     Objective:    Today's Vitals   01/07/23 0818  Weight: 228 lb (103.4 kg)   Body mass index is 32.71 kg/m.     01/07/2023    8:27 AM 02/12/2022    9:30 AM 12/25/2021    9:09 AM 09/08/2020    8:20 AM 09/09/2019    2:30 AM 09/08/2019    7:30 PM 09/04/2019    2:30 PM  Advanced Directives  Does Patient Have a Medical Advance Directive? Yes Yes Yes Yes Yes Yes Yes  Type of Estate agent of [place];Living will Living will Healthcare Power of Attorney Living will;Healthcare Power of [employer] Power of [place];Living will Healthcare Power of [place];Living will Healthcare Power of [place];Living will  Does patient want to make changes to medical advance directive?     No - Patient declined No - Patient declined No - Patient declined  Copy of Healthcare Power of Attorney in Chart? No - copy requested  No - copy requested No - copy requested  No - copy requested No - copy requested    Current Medications (verified) Outpatient Encounter Medications as of 01/07/2023  Medication Sig   acetaminophen (TYLENOL) 500 MG tablet Take 500-1,000 mg  by mouth every 6 (six) hours as needed for mild pain or headache.   albuterol (VENTOLIN HFA) 108 (90 Base) MCG/ACT inhaler Inhale 2 puffs into the lungs in the morning and at bedtime.   Alpha-Lipoic Acid 600 MG CAPS Take 600 mg by mouth daily with supper.   amLODipine (NORVASC) 10 MG tablet TAKE 1 TABLET BY MOUTH EVERY DAY (Patient taking differently: Take 10 mg by mouth daily.)   apixaban (ELIQUIS) 5 MG TABS tablet Take 1 tablet (5 mg total) by mouth 2 (two) times daily.   Carboxymethylcellul-Glycerin (REFRESH RELIEVA OP) Place 1 drop into both eyes 4 (four) times daily.   Cholecalciferol (VITAMIN D) 50 MCG (2000 UT) CAPS Take 2,000 Units by mouth daily with supper.   diltiazem (CARDIZEM) 30 MG tablet Take 30 mg by mouth daily as needed. For elevated HR   fluticasone (FLONASE) 50 MCG/ACT nasal spray Place 1-2 sprays into both nostrils daily as needed (for seasonal allergies).   hydrocortisone (CORTEF) 20 MG tablet Take 15 mg by mouth daily.   Lancets (ONETOUCH DELICA PLUS LANCET33G) MISC    loratadine (CLARITIN) 10 MG tablet Take 10 mg by mouth daily as needed (for seasonal allergies).   Magnesium Oxide 400 MG CAPS Take 1 capsule (400 mg total) by mouth daily with supper.   metFORMIN (GLUCOPHAGE) 500 MG tablet Take 1,000 mg by mouth 2 (two) times daily.   metoprolol succinate (TOPROL-XL) 25 MG 24 hr tablet Take 2 tablets by mouth 2 (two) times daily. Per Kateri Mc  cardiology- now on 50 mg once daily XR   montelukast (SINGULAIR) 10 MG tablet Take 10 mg by mouth at bedtime.   Multiple Vitamin (MULTI-VITAMIN) tablet Take 1 tablet by mouth daily with breakfast.   niacinamide 500 MG tablet Take 500 mg by mouth 2 (two) times daily with a meal.   omeprazole (PRILOSEC) 10 MG capsule TAKE 1 CAPSULE BY MOUTH EVERY DAY (Patient taking differently: Take 40 mg by mouth daily before breakfast.)   ondansetron (ZOFRAN-ODT) 8 MG disintegrating tablet Take 8 mg by mouth every 8 (eight) hours as needed for nausea.    ONETOUCH ULTRA test strip TEST 2 TIMES DAILY USE AS INSTRUCTED.   polyethylene glycol (MIRALAX / GLYCOLAX) 17 g packet Take 17 g by mouth See admin instructions. Mix 17 grams of powder into 4-8 ounces of water and drink one to two times a week   PRESCRIPTION MEDICATION Place 1 application  onto teeth See admin instructions. Sodium Fluoride 5000 dry mouth toothpaste- Brush teeth for 2 minutes using the toothpaste daily as directed   PULMICORT FLEXHALER 180 MCG/ACT inhaler Inhale 1 puff into the lungs in the morning and at bedtime.   rosuvastatin (CRESTOR) 40 MG tablet TAKE 1 TABLET BY MOUTH EVERY DAY   senna-docusate (SENOKOT-S) 8.6-50 MG tablet Take 2 tablets by mouth at bedtime.    sildenafil (VIAGRA) 100 MG tablet Take 1 tablet (100 mg total) by mouth daily as needed for erectile dysfunction.   sodium chloride (OCEAN) 0.65 % SOLN nasal spray Place 1-2 sprays into both nostrils in the morning and at bedtime.   venlafaxine XR (EFFEXOR-XR) 75 MG 24 hr capsule TAKE 1 CAPSULE BY MOUTH DAILY WITH BREAKFAST.   amoxicillin (AMOXIL) 500 MG capsule Take 2,000 mg by mouth See admin instructions. Take 2,000 mg by mouth one hour prior to dental appointments   No facility-administered encounter medications on file as of 01/07/2023.    Allergies (verified) Levofloxacin and Tape   History: Past Medical History:  Diagnosis Date   Adrenocortical insufficiency (HCC) 08/06/2017   Patient with 2 hospitalizations in January 2019- treated with fluids, antibiotics, steroids- ultimately thought most likely due to adrenal insufficiency with no obvious source of infection found   Allergy    seasonal/animals   Chronic prostatitis 12/17/2008   Diverticulosis 01/16/2011   History of diverticulitis    ERECTILE DYSFUNCTION 02/07/2007   HYPERLIPIDEMIA 02/04/2007   HYPERTENSION 02/04/2007   NEOPLASM, MALIGNANT, SKIN, BACK 02/09/2009   Pneumonia, viral 12/2017   S/P bone marrow transplant [facility])    Past Surgical History:   Procedure Laterality Date   CATARACT EXTRACTION Left 06/2018   CATARACT EXTRACTION Right 10/08/2019   none     SCALP LESION REMOVAL W/ FLAP AND SKIN GRAFT Left    Family History  Problem Relation Age of Onset   Breast cancer Mother    CVA Mother        quit taking HTN meds   Alzheimer's disease Father        late 45s. mid 32s   Asperger's syndrome Son    Social History   Socioeconomic History   Marital status: Married    Spouse name: Not on file   Number of children: Not on file   Years of education: Not on file   Highest education level: Bachelor's degree (e.g., BA, AB, BS)  Occupational History   Not on file  Tobacco Use   Smoking status: Never   Smokeless tobacco: Never  Substance and Sexual Activity  Alcohol use: Yes    Alcohol/week: 3.0 standard drinks of alcohol    Types: 3 Standard drinks or equivalent per week   Drug use: No   Sexual activity: Yes  Other Topics Concern   Not on file  Social History Narrative   Married 1973. Wife has Parkinsons. 2 daughters, 1 son (middle). 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, Journalist, newspaper, Diplomatic Services operational officer   Social Determinants of Health   Financial Resource Strain: Low Risk  (01/03/2023)   Overall Financial Resource Strain (CARDIA)    Difficulty of Paying Living Expenses: Not hard at all  Food Insecurity: No Food Insecurity (01/03/2023)   Hunger Vital Sign    Worried About Running Out of Food in the Last Year: Never true    Ran Out of Food in the Last Year: Never true  Transportation Needs: No Transportation Needs (01/03/2023)   PRAPARE - Administrator, Civil Service (Medical): No    Lack of Transportation (Non-Medical): No  Physical Activity: Insufficiently Active (01/03/2023)   Exercise Vital Sign    Days of Exercise per Week: 3 days    Minutes of Exercise per Session: 30 min  Stress: No Stress Concern Present  (01/03/2023)   [employer] of Occupational Health - Occupational Stress Questionnaire    Feeling of Stress : Only a little  Social Connections: Unknown (01/03/2023)   Social Connection and Isolation Panel [NHANES]    Frequency of Communication with Friends and Family: More than three times a week    Frequency of Social Gatherings with Friends and Family: Twice a week    Attends Religious Services: Patient declined    Database administrator or Organizations: Yes    Attends Engineer, structural: More than 4 times per year    Marital Status: Married    Tobacco Counseling Counseling given: Not Answered   Clinical Intake:  Pre-visit preparation completed: Yes  Pain : No/denies pain     BMI - recorded: 32.71 Nutritional Status: BMI > 30  Obese Nutritional Risks: None Diabetes: No  How often do you need to have someone help you when you read instructions, pamphlets, or other written materials from your doctor or pharmacy?: 1 - Never  Interpreter Needed?: No  Information entered by :: Lanier Ensign, LPN   Activities of Daily Living    01/03/2023    8:59 PM  In your present state of health, do you have any difficulty performing the following activities:  Hearing? 0  Vision? 0  Difficulty concentrating or making decisions? 0  Walking or climbing stairs? 0  Dressing or bathing? 0  Doing errands, shopping? 0  Preparing Food and eating ? N  Using the Toilet? N  In the past six months, have you accidently leaked urine? N  Do you have problems with loss of bowel control? N  Managing your Medications? N  Managing your Finances? N  Housekeeping or managing your Housekeeping? N    Patient Care Team: Shelva Majestic, MD as PCP - General (Family Medicine) Fabio Asa, MD as Referring Physician (Oncology) Chauncey Cruel, MD as Consulting Physician (Neurology) Boisvert, Humberto Leep, MD as Consulting Physician (Ophthalmology) Jessee Avers  Diprincipe, MD as Consulting Physician (Endocrinology) Govert, Lysle Morales, MD as Consulting Physician (Pulmonary Disease) [place], Adela Modena Morrow, MD as Consulting Physician (Dermatology) Chevis Pretty Burtis Junes, MD as Consulting Physician (Gastroenterology)  Indicate any  recent Medical Services you may have received from other than [facility] providers in the past year (date may be approximate).     Assessment:   This is a routine wellness examination for Brandon [name].  Hearing/Vision screen Hearing Screening - Comments:: Pt stated slight loss  Vision Screening - Comments:: Pt follows up with [facility] for annual eye exams   Dietary issues and exercise activities discussed:     Goals Addressed             This Visit's Progress    Patient Stated       Stay healthy        Depression Screen    01/07/2023    8:25 AM 01/08/2022    4:51 PM 12/25/2021    9:08 AM 01/17/2021    8:41 AM 09/08/2020    8:19 AM 08/26/2020   10:41 AM 01/05/2020    3:01 PM  PHQ 2/9 Scores  PHQ - 2 Score 0 3 0 0 0 0 0  PHQ- 9 Score  7         Fall Risk    01/03/2023    8:59 PM 12/25/2021    9:10 AM 01/17/2021    8:41 AM 09/08/2020    8:21 AM 08/26/2020   10:41 AM  Fall Risk   Falls in the past year? 0 0 0 0 0  Number falls in past yr:  0 0 0 0  Injury with Fall?  0 0 0 0  Risk for fall due to : Impaired vision;Impaired balance/gait Impaired balance/gait;Impaired vision No Fall Risks Impaired vision   Risk for fall due to: Comment  due to neuropathy     Follow up Falls prevention discussed Falls prevention discussed Falls evaluation completed Falls prevention discussed     MEDICARE RISK AT HOME:   TIMED UP AND GO:  Was the test performed?  No    Cognitive Function:        01/07/2023    8:29 AM 12/25/2021    9:11 AM 09/08/2020    8:23 AM 05/06/2019   10:00 AM  6CIT Screen  What Year? 0 points 0 points 0 points 0 points  What month? 0 points 0 points 0 points 0 points  What time? 0 points 0 points  0  points  Count back from 20 0 points 0 points 0 points 0 points  Months in reverse 0 points 0 points 0 points 0 points  Repeat phrase 0 points 0 points 0 points 0 points  Total Score 0 points 0 points  0 points    Immunizations Immunization History  Administered Date(s) Administered   DTaP / HiB / IPV 12/04/2017, 03/05/2018, 08/27/2018   Fluad Quad(high Dose 65+) 03/17/2020, 03/23/2022   Hepatitis A 12/25/2005, 11/25/2007   Hepatitis A, Adult 12/25/2005, 11/28/2007   Hepatitis B 05/25/1996, 06/25/1997, 12/01/1998   Hepatitis B, ADULT 12/04/2017, 03/05/2018, 08/27/2018   Influenza, High Dose Seasonal PF 07/09/2017, 04/16/2018, 02/17/2019, 03/29/2021, 03/23/2022   Influenza,inj,Quad PF,6+ Mos 02/18/2013, 05/03/2014   Influenza-Unspecified 04/02/1996, 04/19/1997, 03/16/2015, 02/17/2019   MMR 08/26/2020   Novel Infuenza-h1n1-09 07/01/2008   PFIZER Comirnaty(Gray Top)Covid-19 Tri-Sucrose Vaccine 03/23/2022   PFIZER(Purple Top)SARS-COV-2 Vaccination 07/28/2019, 08/20/2019, 02/10/2020, 07/26/2020   PPD Test 08/15/2012   Pfizer Covid-19 Vaccine Bivalent Booster 64yrs & up 04/19/2021, 10/18/2021   Pneumococcal Conjugate-13 05/11/2015, 12/04/2017, 03/05/2018, 08/27/2018   Pneumococcal Polysaccharide-23 05/03/2014, 05/09/2020   Td 12/05/2005   Tdap 12/05/2005, 08/27/2018   Typhoid Live 12/05/2005   Yellow Fever 12/05/2005  Zoster Recombinant(Shingrix) 04/19/2021, 10/18/2021   Zoster, Live 02/15/2012    TDAP status: Up to date  Flu Vaccine status: Up to date  Pneumococcal vaccine status: Up to date  Covid-19 vaccine status: Completed vaccines  Qualifies for Shingles Vaccine? Yes   Zostavax completed Yes   Shingrix Completed?: Yes  Screening Tests Health Maintenance  Topic Date Due   COVID-19 Vaccine (8 - 2023-24 season) 05/18/2022   INFLUENZA VACCINE  01/24/2023   Colonoscopy  11/19/2023   Medicare Annual Wellness (AWV)  01/07/2024   DEXA SCAN  04/16/2025   DTaP/Tdap/Td (7  - Td or Tdap) 08/26/2028   Pneumonia Vaccine 86+ Years old  Completed   Hepatitis C Screening  Completed   Zoster Vaccines- Shingrix  Completed   HPV VACCINES  Aged Out    Health Maintenance  Health Maintenance Due  Topic Date Due   COVID-19 Vaccine (8 - 2023-24 season) 05/18/2022    Colorectal cancer screening: Type of screening: Colonoscopy. Completed 12/23/18. Repeat every 5 years   Additional Screening:  Hepatitis C Screening:  Completed 11/02/15  Vision Screening: Recommended annual ophthalmology exams for early detection of glaucoma and other disorders of the eye. Is the patient up to date with their annual eye exam?  Yes  Who is the provider or what is the name of the office in which the patient attends annual eye exams? [facility]  If pt is not established with a provider, would they like to be referred to a provider to establish care? No .   Dental Screening: Recommended annual dental exams for proper oral hygiene  Diabetic Foot Exam:  Community Resource Referral / Chronic Care Management: CRR required this visit?  No   CCM required this visit?  No     Plan:     I have personally reviewed and noted the following in the patient's chart:   Medical and social history Use of alcohol, tobacco or illicit drugs  Current medications and supplements including opioid prescriptions. Patient is not currently taking opioid prescriptions. Functional ability and status Nutritional status Physical activity Advanced directives List of other physicians Hospitalizations, surgeries, and ER visits in previous 12 months Vitals Screenings to include cognitive, depression, and falls Referrals and appointments  In addition, I have reviewed and discussed with patient certain preventive protocols, quality metrics, and best practice recommendations. A written personalized care plan for preventive services as well as general preventive health recommendations were provided to patient.      Marzella Schlein, LPN   08/08/863   After Visit Summary: (MyChart) Due to this being a telephonic visit, the after visit summary with patients personalized plan was offered to patient via MyChart   Nurse Notes: none

## 2023-01-17 DIAGNOSIS — L821 Other seborrheic keratosis: Secondary | ICD-10-CM | POA: Diagnosis not present

## 2023-01-17 DIAGNOSIS — L578 Other skin changes due to chronic exposure to nonionizing radiation: Secondary | ICD-10-CM | POA: Diagnosis not present

## 2023-01-17 DIAGNOSIS — L720 Epidermal cyst: Secondary | ICD-10-CM | POA: Diagnosis not present

## 2023-01-17 DIAGNOSIS — Z85828 Personal history of other malignant neoplasm of skin: Secondary | ICD-10-CM | POA: Diagnosis not present

## 2023-01-17 DIAGNOSIS — L820 Inflamed seborrheic keratosis: Secondary | ICD-10-CM | POA: Diagnosis not present

## 2023-01-17 DIAGNOSIS — D2371 Other benign neoplasm of skin of right lower limb, including hip: Secondary | ICD-10-CM | POA: Diagnosis not present

## 2023-01-17 DIAGNOSIS — L570 Actinic keratosis: Secondary | ICD-10-CM | POA: Diagnosis not present

## 2023-02-20 ENCOUNTER — Encounter: Payer: Self-pay | Admitting: Family Medicine

## 2023-02-21 ENCOUNTER — Other Ambulatory Visit: Payer: Self-pay

## 2023-02-21 DIAGNOSIS — E2740 Unspecified adrenocortical insufficiency: Secondary | ICD-10-CM

## 2023-02-26 ENCOUNTER — Other Ambulatory Visit (INDEPENDENT_AMBULATORY_CARE_PROVIDER_SITE_OTHER): Payer: Medicare Other

## 2023-02-26 DIAGNOSIS — E2740 Unspecified adrenocortical insufficiency: Secondary | ICD-10-CM

## 2023-02-27 LAB — CORTISOL: Cortisol, Plasma: 14.4 ug/dL

## 2023-02-28 LAB — ACTH: C206 ACTH: 39 pg/mL (ref 6–50)

## 2023-03-01 DIAGNOSIS — R739 Hyperglycemia, unspecified: Secondary | ICD-10-CM | POA: Diagnosis not present

## 2023-03-01 DIAGNOSIS — R634 Abnormal weight loss: Secondary | ICD-10-CM | POA: Diagnosis not present

## 2023-03-01 DIAGNOSIS — E2740 Unspecified adrenocortical insufficiency: Secondary | ICD-10-CM | POA: Diagnosis not present

## 2023-03-01 DIAGNOSIS — T50905A Adverse effect of unspecified drugs, medicaments and biological substances, initial encounter: Secondary | ICD-10-CM | POA: Diagnosis not present

## 2023-03-01 DIAGNOSIS — T380X5A Adverse effect of glucocorticoids and synthetic analogues, initial encounter: Secondary | ICD-10-CM | POA: Diagnosis not present

## 2023-03-19 ENCOUNTER — Encounter: Payer: Self-pay | Admitting: Family Medicine

## 2023-04-17 DIAGNOSIS — C9201 Acute myeloblastic leukemia, in remission: Secondary | ICD-10-CM | POA: Diagnosis not present

## 2023-04-17 DIAGNOSIS — D469 Myelodysplastic syndrome, unspecified: Secondary | ICD-10-CM | POA: Diagnosis not present

## 2023-04-17 DIAGNOSIS — Z9484 Stem cells transplant status: Secondary | ICD-10-CM | POA: Diagnosis not present

## 2023-04-17 DIAGNOSIS — Z9481 Bone marrow transplant status: Secondary | ICD-10-CM | POA: Diagnosis not present

## 2023-04-17 DIAGNOSIS — T8609 Other complications of bone marrow transplant: Secondary | ICD-10-CM | POA: Diagnosis not present

## 2023-04-17 DIAGNOSIS — D89811 Chronic graft-versus-host disease: Secondary | ICD-10-CM | POA: Diagnosis not present

## 2023-04-17 DIAGNOSIS — E2740 Unspecified adrenocortical insufficiency: Secondary | ICD-10-CM | POA: Diagnosis not present

## 2023-05-03 ENCOUNTER — Other Ambulatory Visit: Payer: Self-pay | Admitting: Family Medicine

## 2023-05-06 ENCOUNTER — Encounter: Payer: Self-pay | Admitting: Family Medicine

## 2023-05-07 NOTE — Telephone Encounter (Signed)
See below, do you want any additional labs ordered?

## 2023-05-09 ENCOUNTER — Other Ambulatory Visit: Payer: Self-pay

## 2023-05-09 DIAGNOSIS — E785 Hyperlipidemia, unspecified: Secondary | ICD-10-CM

## 2023-05-09 DIAGNOSIS — E1169 Type 2 diabetes mellitus with other specified complication: Secondary | ICD-10-CM

## 2023-05-09 DIAGNOSIS — E2740 Unspecified adrenocortical insufficiency: Secondary | ICD-10-CM

## 2023-05-10 DIAGNOSIS — R0600 Dyspnea, unspecified: Secondary | ICD-10-CM | POA: Diagnosis not present

## 2023-05-10 DIAGNOSIS — R0689 Other abnormalities of breathing: Secondary | ICD-10-CM | POA: Diagnosis not present

## 2023-05-10 DIAGNOSIS — D89811 Chronic graft-versus-host disease: Secondary | ICD-10-CM | POA: Diagnosis not present

## 2023-05-10 DIAGNOSIS — Z8709 Personal history of other diseases of the respiratory system: Secondary | ICD-10-CM | POA: Diagnosis not present

## 2023-05-10 DIAGNOSIS — C9201 Acute myeloblastic leukemia, in remission: Secondary | ICD-10-CM | POA: Diagnosis not present

## 2023-05-13 ENCOUNTER — Other Ambulatory Visit (INDEPENDENT_AMBULATORY_CARE_PROVIDER_SITE_OTHER): Payer: Medicare Other

## 2023-05-13 DIAGNOSIS — E785 Hyperlipidemia, unspecified: Secondary | ICD-10-CM

## 2023-05-13 DIAGNOSIS — E1169 Type 2 diabetes mellitus with other specified complication: Secondary | ICD-10-CM | POA: Diagnosis not present

## 2023-05-13 DIAGNOSIS — E2740 Unspecified adrenocortical insufficiency: Secondary | ICD-10-CM

## 2023-05-13 LAB — LIPID PANEL
Cholesterol: 110 mg/dL (ref 0–200)
HDL: 39.6 mg/dL (ref >39.00)
LDL Cholesterol: 44 mg/dL (ref 0–99)
NonHDL: 70.85
Total CHOL/HDL Ratio: 3
Triglycerides: 133 mg/dL (ref 0.0–149.0)
VLDL: 26.6 mg/dL (ref 0.0–40.0)

## 2023-05-13 LAB — CORTISOL: Cortisol, Plasma: 7 ug/dL

## 2023-05-13 LAB — HEMOGLOBIN A1C: Hgb A1c MFr Bld: 6.8 % — ABNORMAL HIGH (ref 4.6–6.5)

## 2023-05-14 ENCOUNTER — Other Ambulatory Visit: Payer: Medicare Other

## 2023-05-15 ENCOUNTER — Ambulatory Visit (INDEPENDENT_AMBULATORY_CARE_PROVIDER_SITE_OTHER): Payer: Medicare Other | Admitting: Family Medicine

## 2023-05-15 ENCOUNTER — Encounter: Payer: Self-pay | Admitting: Family Medicine

## 2023-05-15 VITALS — BP 130/72 | HR 83 | Temp 97.7°F | Ht 70.0 in | Wt 224.4 lb

## 2023-05-15 DIAGNOSIS — I10 Essential (primary) hypertension: Secondary | ICD-10-CM | POA: Diagnosis not present

## 2023-05-15 DIAGNOSIS — E2740 Unspecified adrenocortical insufficiency: Secondary | ICD-10-CM

## 2023-05-15 DIAGNOSIS — E785 Hyperlipidemia, unspecified: Secondary | ICD-10-CM

## 2023-05-15 DIAGNOSIS — D89813 Graft-versus-host disease, unspecified: Secondary | ICD-10-CM | POA: Diagnosis not present

## 2023-05-15 DIAGNOSIS — I4891 Unspecified atrial fibrillation: Secondary | ICD-10-CM | POA: Diagnosis not present

## 2023-05-15 MED ORDER — METOPROLOL SUCCINATE ER 50 MG PO TB24: 50.0000 mg | ORAL_TABLET | Freq: Every day | ORAL | 3 refills | Status: AC

## 2023-05-15 MED ORDER — ROSUVASTATIN CALCIUM 20 MG PO TABS: 20.0000 mg | ORAL_TABLET | Freq: Every day | ORAL | 3 refills | Status: DC

## 2023-05-15 NOTE — Progress Notes (Signed)
Phone [phone] In person visit   Subjective:   Brandon [name] is a 72 y.o. year old very pleasant male patient who presents for/with See problem oriented charting Chief Complaint  Patient presents with   Medical Management of Chronic Issues   Hypertension   Hyperlipidemia   Past Medical History-  Patient Active Problem List   Diagnosis Date Noted   Atrial fibrillation (HCC) 02/12/2022    Priority: High   Allergic rhinitis 10/03/2019    Priority: High   Pancreatic lesion 10/03/2019    Priority: High   History of CVA (cerebrovascular accident) 03/10/2019    Priority: High   Adrenal insufficiency (HCC) 08/06/2017    Priority: High   GVHD (graft versus host disease) (HCC) 03/04/2017    Priority: High   History of myelodysplastic syndrome s/p stem cell transplant in 2017  12/06/2015    Priority: High   Steroid-induced hyperglycemia 08/16/2014    Priority: High   Erectile dysfunction 01/08/2022    Priority: Medium    Fatty liver 01/05/2020    Priority: Medium    Osteopenia 10/05/2019    Priority: Medium    History of DVT (deep vein thrombosis) 09/25/2018    Priority: Medium    Post chemotherapy Dry eyes due to decreased tear production 08/06/2017    Priority: Medium    post chemotherapy Peripheral neuropathy 08/06/2017    Priority: Medium    BPH associated with nocturia 11/15/2014    Priority: Medium    Hyperlipidemia 02/04/2007    Priority: Medium    Essential hypertension 02/04/2007    Priority: Medium    History of stem cell transplant (HCC) 03/04/2017    Priority: Low   Leukopenia 11/15/2014    Priority: Low   Chest pain 08/13/2014    Priority: Low   GERD (gastroesophageal reflux disease) 05/03/2014    Priority: Low   Cervical disc disorder with radiculopathy of cervical region 07/14/2010    Priority: Low   MIXED HEARING LOSS BILATERAL 07/14/2010    Priority: Low   ERECTILE DYSFUNCTION 02/07/2007    Priority: Low   AKI (acute kidney injury) (HCC)  02/12/2022   Leukocytosis 02/12/2022    Medications- reviewed and updated Current Outpatient Medications  Medication Sig Dispense Refill   acetaminophen (TYLENOL) 500 MG tablet Take 500-1,000 mg by mouth every 6 (six) hours as needed for mild pain or headache.     albuterol (VENTOLIN HFA) 108 (90 Base) MCG/ACT inhaler Inhale 2 puffs into the lungs in the morning and at bedtime.     Alpha-Lipoic Acid 600 MG CAPS Take 600 mg by mouth daily with supper.     amLODipine (NORVASC) 10 MG tablet TAKE 1 TABLET BY MOUTH EVERY DAY (Patient taking differently: Take 10 mg by mouth daily.) 90 tablet 3   amoxicillin (AMOXIL) 500 MG capsule Take 2,000 mg by mouth See admin instructions. Take 2,000 mg by mouth one hour prior to dental appointments     apixaban (ELIQUIS) 5 MG TABS tablet Take 1 tablet (5 mg total) by mouth 2 (two) times daily. 60 tablet 3   Carboxymethylcellul-Glycerin (REFRESH RELIEVA OP) Place 1 drop into both eyes 4 (four) times daily.     Cholecalciferol (VITAMIN D) 50 MCG (2000 UT) CAPS Take 2,000 Units by mouth daily with supper.     diltiazem (CARDIZEM) 30 MG tablet Take 30 mg by mouth daily as needed. For elevated HR     fluticasone (FLONASE) 50 MCG/ACT nasal spray Place 1-2 sprays into both nostrils  daily as needed (for seasonal allergies).     hydrocortisone (CORTEF) 20 MG tablet Take 15 mg by mouth daily.     Lancets (ONETOUCH DELICA PLUS LANCET33G) MISC      loratadine (CLARITIN) 10 MG tablet Take 10 mg by mouth daily as needed (for seasonal allergies).     Magnesium Oxide 400 MG CAPS Take 1 capsule (400 mg total) by mouth daily with supper.     metFORMIN (GLUCOPHAGE) 500 MG tablet Take 1,000 mg by mouth 2 (two) times daily.     metoprolol succinate (TOPROL-XL) 50 MG 24 hr tablet Take 1 tablet (50 mg total) by mouth daily. Take with or immediately following a meal. 90 tablet 3   montelukast (SINGULAIR) 10 MG tablet Take 10 mg by mouth at bedtime.     Multiple Vitamin  (MULTI-VITAMIN) tablet Take 1 tablet by mouth daily with breakfast.     niacinamide 500 MG tablet Take 500 mg by mouth 2 (two) times daily with a meal.     omeprazole (PRILOSEC) 40 MG capsule Take 40 mg by mouth daily.     ondansetron (ZOFRAN-ODT) 8 MG disintegrating tablet Take 8 mg by mouth every 8 (eight) hours as needed for nausea.     ONETOUCH ULTRA test strip TEST 2 TIMES DAILY USE AS INSTRUCTED.     polyethylene glycol (MIRALAX / GLYCOLAX) 17 g packet Take 17 g by mouth See admin instructions. Mix 17 grams of powder into 4-8 ounces of water and drink one to two times a week     PRESCRIPTION MEDICATION Place 1 application  onto teeth See admin instructions. Sodium Fluoride 5000 dry mouth toothpaste- Brush teeth for 2 minutes using the toothpaste daily as directed     PULMICORT FLEXHALER 180 MCG/ACT inhaler Inhale 1 puff into the lungs in the morning and at bedtime.     senna-docusate (SENOKOT-S) 8.6-50 MG tablet Take 2 tablets by mouth at bedtime.      sildenafil (VIAGRA) 100 MG tablet Take 1 tablet (100 mg total) by mouth daily as needed for erectile dysfunction. 30 tablet 3   sodium chloride (OCEAN) 0.65 % SOLN nasal spray Place 1-2 sprays into both nostrils in the morning and at bedtime.     venlafaxine XR (EFFEXOR-XR) 75 MG 24 hr capsule TAKE 1 CAPSULE BY MOUTH DAILY WITH BREAKFAST. 90 capsule 2   rosuvastatin (CRESTOR) 20 MG tablet Take 1 tablet (20 mg total) by mouth daily. 90 tablet 3   No current facility-administered medications for this visit.     Objective:  BP 130/72   Pulse 83   Temp 97.7 F (36.5 C)   Ht 5\' 10"  (1.778 m)   Wt 224 lb 6.4 oz (101.8 kg)   SpO2 96%   BMI 32.20 kg/m  Gen: NAD, resting comfortably CV: RRR no murmurs rubs or gallops Lungs: CTAB no crackles, wheeze, rhonchi Ext: no edema Skin: warm, dry    Assessment and Plan   #social update- family coming in for thanksgiving  # Pancreatic cyst-follow up MRI of the abd- pancreas focus- now followed  at [facility] -Stable on 07/04/2022 with 1 year imaging and 6 month bloodwork as long as stable- scheduled again in january   # Health maintenance-we will need DTAP injection after March 2025-has been advised every 5 to 7 years  #History of myelodysplastic syndrome that transitioned to AML history-has continued to do well and now over 7 years out from stem cell transplant    #Graft-versus-host disease-on Singulair and  Pulmicort through [facility] - plan is to wean off singulair next visit  % Adrenal insufficiency-follows with [facility] endocrinology Dr. Catalina Gravel been off of hydrocortisone for nearly a month at this point- cortisol lower but in normal range   # Atrial fibrillation-new onset August 2023- follows at [facility] #history of stroke prior to detection- thankfully mild S: Rate controlled with metoprolol 50 mg XR daily, diltiazem 30 mg daily as needed for elevated heart rate- not needing Anticoagulated with Eliquis 5 mg twice daily A/P: appropriately anticoagulated and rate controlled- continue current medicine  #hypertension S: medication: metoprolol 50 mg XR,  Amlodipine 10 mg BP Readings from Last 3 Encounters:  05/15/23 130/72  12/04/22 124/74  07/17/22 124/78  A/P: blood pressure well controlled continue current medications - I did refill the metoprolol- previously appears was coming from [facility] most recently but easier for refills for him here  #hyperlipidemia S: Medication:Rosuvastatin 40 mg  Lab Results  Component Value Date   CHOL 110 05/13/2023   HDL 39.60 05/13/2023   LDLCALC 44 05/13/2023   LDLDIRECT 77 02/17/2020   TRIG 133.0 05/13/2023   CHOLHDL 3 05/13/2023  A/P: cholesterol is incredibly well controlled- honestly as long as LDL under 70 I tihnk that's reasonable so we opted to trial 20 mg rosuvastatin instead of 40 mg   #Steroid induced hyperglycemia S: Medication:Metformin 1000 mg twice daily Lab Results  Component Value Date   HGBA1C 6.8 (H) 05/13/2023   HGBA1C  6.7 (H) 01/02/2023   HGBA1C 6.9 (H) 07/17/2022  A/P: reasonable control- if able to stay off hydrocortisone and a1c does not trend down may need to label diabetes - though [facility] has already labeled diabetes   #/Depressed mood S: Medication: Venlafaxine/Effexor 25 mg twice daily per oncology team in past- we changed to 75 mg XR     01/07/2023    8:25 AM 01/08/2022    4:51 PM 12/25/2021    9:08 AM  Depression screen PHQ 2/9  Decreased Interest 0 2 0  Down, Depressed, Hopeless 0 1 0  PHQ - 2 Score 0 3 0  Altered sleeping  2   Tired, decreased energy  2   Change in appetite  0   Feeling bad or failure about yourself   0   Trouble concentrating  0   Moving slowly or fidgety/restless  0   Suicidal thoughts  0   PHQ-9 Score  7   Difficult doing work/chores  Somewhat difficult   A/P: doing well- continue current medications for now- maybe in the spring look at reducing- had been on 25 mg IR twice daily in past through oncology- that might be helpful for wean    # GERD S:Medication:  omeprazole 40 mg per [facility] in January 2024 A/P: we will see at next visit if we can get him down to 20 mg perhaps- has been doing well but want to wait as big adjustment with hydrocortisone nw off   #likely osteoarthritis hand-  Can try Voltaren gel on that finger/diclofenac for right 3rd finger  Recommended follow up: Return in about 4 months (around 09/12/2023) for physical or sooner if needed.Schedule b4 you leave. Future Appointments  Date Time Provider Department Center  01/13/2024  7:45 AM [facility] ANNUAL WELLNESS VISIT 1 [facility]   Lab/Order associations:   ICD-10-CM   1. Essential hypertension  I10     2. Hyperlipidemia, unspecified hyperlipidemia type  E78.5     3. Adrenal insufficiency (HCC)  E27.40  4. Atrial fibrillation, unspecified type (HCC)  I48.91     5. GVHD (graft versus host disease) (HCC)  Z61.096       Meds ordered this encounter  Medications   metoprolol succinate  (TOPROL-XL) 50 MG 24 hr tablet    Sig: Take 1 tablet (50 mg total) by mouth daily. Take with or immediately following a meal.    Dispense:  90 tablet    Refill:  3   rosuvastatin (CRESTOR) 20 MG tablet    Sig: Take 1 tablet (20 mg total) by mouth daily.    Dispense:  90 tablet    Refill:  3    Return precautions advised.  Tana Conch, MD

## 2023-05-15 NOTE — Assessment & Plan Note (Signed)
S: Medication:Rosuvastatin 40 mg  Lab Results  Component Value Date   CHOL 110 05/13/2023   HDL 39.60 05/13/2023   LDLCALC 44 05/13/2023   LDLDIRECT 77 02/17/2020   TRIG 133.0 05/13/2023   CHOLHDL 3 05/13/2023  A/P: cholesterol is incredibly well controlled- honestly as long as LDL under 70 I tihnk that's reasonable so we opted to trial 20 mg rosuvastatin instead of 40 mg

## 2023-05-15 NOTE — Patient Instructions (Addendum)
cholesterol is incredibly well controlled- honestly as long as LDL under 70 I tihnk that's reasonable so we opted to trial 20 mg rosuvastatin instead of 40 mg   Glad you are doing so well! This is awesome!   Can try Voltaren gel on that finger/diclofenac for right 3rd finger  Recommended follow up: Return in about 4 months (around 09/12/2023) for physical or sooner if needed.Schedule b4 you leave.

## 2023-05-17 LAB — ACTH: C206 ACTH: 29 pg/mL (ref 6–50)

## 2023-05-20 DIAGNOSIS — D89813 Graft-versus-host disease, unspecified: Secondary | ICD-10-CM | POA: Diagnosis not present

## 2023-05-20 DIAGNOSIS — E119 Type 2 diabetes mellitus without complications: Secondary | ICD-10-CM | POA: Diagnosis not present

## 2023-05-20 DIAGNOSIS — H018 Other specified inflammations of eyelid: Secondary | ICD-10-CM | POA: Diagnosis not present

## 2023-05-20 DIAGNOSIS — H11823 Conjunctivochalasis, bilateral: Secondary | ICD-10-CM | POA: Diagnosis not present

## 2023-06-04 DIAGNOSIS — D89811 Chronic graft-versus-host disease: Secondary | ICD-10-CM | POA: Diagnosis not present

## 2023-06-04 DIAGNOSIS — E782 Mixed hyperlipidemia: Secondary | ICD-10-CM | POA: Diagnosis not present

## 2023-06-04 DIAGNOSIS — T8609 Other complications of bone marrow transplant: Secondary | ICD-10-CM | POA: Diagnosis not present

## 2023-06-04 DIAGNOSIS — Z7901 Long term (current) use of anticoagulants: Secondary | ICD-10-CM | POA: Diagnosis not present

## 2023-06-04 DIAGNOSIS — I480 Paroxysmal atrial fibrillation: Secondary | ICD-10-CM | POA: Diagnosis not present

## 2023-06-04 DIAGNOSIS — Z9481 Bone marrow transplant status: Secondary | ICD-10-CM | POA: Diagnosis not present

## 2023-06-04 DIAGNOSIS — D469 Myelodysplastic syndrome, unspecified: Secondary | ICD-10-CM | POA: Diagnosis not present

## 2023-06-04 DIAGNOSIS — I10 Essential (primary) hypertension: Secondary | ICD-10-CM | POA: Diagnosis not present

## 2023-06-04 DIAGNOSIS — Z79899 Other long term (current) drug therapy: Secondary | ICD-10-CM | POA: Diagnosis not present

## 2023-06-04 DIAGNOSIS — Z8673 Personal history of transient ischemic attack (TIA), and cerebral infarction without residual deficits: Secondary | ICD-10-CM | POA: Diagnosis not present

## 2023-07-02 ENCOUNTER — Other Ambulatory Visit: Payer: Self-pay

## 2023-07-02 ENCOUNTER — Encounter: Payer: Self-pay | Admitting: Family Medicine

## 2023-07-02 DIAGNOSIS — E2740 Unspecified adrenocortical insufficiency: Secondary | ICD-10-CM

## 2023-07-04 ENCOUNTER — Other Ambulatory Visit (INDEPENDENT_AMBULATORY_CARE_PROVIDER_SITE_OTHER): Payer: Medicare Other

## 2023-07-04 ENCOUNTER — Other Ambulatory Visit: Payer: Self-pay

## 2023-07-04 DIAGNOSIS — E2740 Unspecified adrenocortical insufficiency: Secondary | ICD-10-CM

## 2023-07-04 LAB — RENAL FUNCTION PANEL
Albumin: 4.1 g/dL (ref 3.5–5.2)
BUN: 12 mg/dL (ref 6–23)
CO2: 25 meq/L (ref 19–32)
Calcium: 9.4 mg/dL (ref 8.4–10.5)
Chloride: 100 meq/L (ref 96–112)
Creatinine, Ser: 1.15 mg/dL (ref 0.40–1.50)
GFR: 62.98 mL/min (ref >60.00)
Glucose, Bld: 116 mg/dL — ABNORMAL HIGH (ref 70–99)
Phosphorus: 2.9 mg/dL (ref 2.3–4.6)
Potassium: 3.9 meq/L (ref 3.5–5.1)
Sodium: 135 meq/L (ref 135–145)

## 2023-07-04 LAB — CORTISOL: Cortisol, Plasma: 12.5 ug/dL

## 2023-07-05 ENCOUNTER — Encounter: Payer: Self-pay | Admitting: Family Medicine

## 2023-07-05 ENCOUNTER — Telehealth: Payer: Self-pay

## 2023-07-05 ENCOUNTER — Ambulatory Visit (INDEPENDENT_AMBULATORY_CARE_PROVIDER_SITE_OTHER): Payer: Medicare Other | Admitting: Family Medicine

## 2023-07-05 VITALS — BP 124/82 | HR 88 | Temp 98.1°F | Ht 70.0 in | Wt 269.0 lb

## 2023-07-05 DIAGNOSIS — K869 Disease of pancreas, unspecified: Secondary | ICD-10-CM | POA: Diagnosis not present

## 2023-07-05 DIAGNOSIS — J0100 Acute maxillary sinusitis, unspecified: Secondary | ICD-10-CM

## 2023-07-05 DIAGNOSIS — Z862 Personal history of diseases of the blood and blood-forming organs and certain disorders involving the immune mechanism: Secondary | ICD-10-CM

## 2023-07-05 DIAGNOSIS — E2740 Unspecified adrenocortical insufficiency: Secondary | ICD-10-CM

## 2023-07-05 MED ORDER — AMOXICILLIN-POT CLAVULANATE 875-125 MG PO TABS: 1.0000 | ORAL_TABLET | Freq: Two times a day (BID) | ORAL | 0 refills | Status: AC

## 2023-07-05 NOTE — Telephone Encounter (Signed)
 Called pt and scheduled him with Dr. Mardelle Matte for 07/05/23 @ 10 am w/ Dr. Mardelle Matte.

## 2023-07-05 NOTE — Patient Instructions (Signed)
 Please follow up if symptoms do not improve or as needed.    VISIT SUMMARY:  During your visit, we discussed your symptoms of a head cold that began about a week ago, which have now developed into acute sinusitis. You also mentioned some dental discomfort and a history of sinus infections and allergies. We reviewed your immunocompromised status due to a past stem cell transplant and your ongoing monitoring of a pancreatic cyst.  YOUR PLAN:  -ACUTE SINUSITIS: Acute sinusitis is an infection of the sinuses that causes symptoms like nasal congestion, thick drainage, facial pressure, and sometimes fever. Given your symptoms and history, we suspect a bacterial infection. We have prescribed a 10-day course of antibiotics and recommend continuing with Mucinex  or Mucinex  DM to help thin the mucus. Please pick up your prescription from the pharmacy and follow the instructions provided in MyChart. Contact us  if your symptoms do not improve or worsen.  -IMMUNOCOMPROMISED STATUS POST-STEM CELL TRANSPLANT: Being immunocompromised means your body has a reduced ability to fight infections, which is important to consider given your history of a stem cell transplant. It's crucial to monitor for any signs of infection and seek prompt medical attention if your symptoms worsen.  -PANCREATIC CYST: A pancreatic cyst is a fluid-filled sac on the pancreas. Yours has been stable for four years. We will reschedule your MRI to follow up on this cyst after your current illness resolves.  INSTRUCTIONS:  Please follow up with us  if your symptoms do not improve or if they worsen. We will also need to reschedule your MRI for the pancreatic cyst once you are feeling better.

## 2023-07-05 NOTE — Progress Notes (Signed)
 Subjective  CC:  Chief Complaint  Patient presents with   Nasal Congestion    Pt stated that he has had a nasal congestion and a cough for the past week. COVID test has been done and it is negative.    Anemia    HPI: Brandon [name] is a 73 y.o. male who presents to the office today to address the problems listed above in the chief complaint. Discussed the use of AI scribe software for clinical note transcription with the patient, who gave verbal consent to proceed.  History of Present Illness   The patient, Brandon [name], presented with symptoms consistent with a head cold that began approximately a week ago, around Sunday or Monday. The onset of symptoms coincided with a visit from relatives over the [place] holiday, one of whom was suspected to have allergies. The patient's symptoms have primarily been localized to the head, with no reported chest involvement. He reported feeling feverish the previous night and continued to feel flushed on the day of the consultation.  The patient also reported some dental discomfort, suggesting possible sinus involvement. He has a history of sinus infections, with the most recent episode occurring around the same time the previous year. The patient also has a history of allergies, which typically flare up around Thanksgiving.  The patient's symptoms have included significant congestion, to the point of needing to breathe through his mouth at times. He has been experiencing thick nasal drainage, which he described as gaggy. Despite taking Mucinex  or Mucinex  DM to thin the mucus, the patient reported no improvement in his symptoms. He feels tired and worn out and clammy. No sob or cp.  In addition to the current symptoms, the patient has a history of a small cyst on the pancreas, which has been under observation for approximately four years without any noted progression. He was scheduled to undergo an MRI for this condition, but the procedure was  postponed due to the current illness.  The patient also has a history of stem cell transplant, which occurred seven and a half years ago. Given this history, the patient expressed concern about the risk of infections.       Assessment  1. Acute non-recurrent maxillary sinusitis   2. Adrenal insufficiency (HCC)   3. Pancreatic lesion   4. History of myelodysplastic syndrome s/p stem cell transplant in 2017       Plan  Assessment and Plan    Acute Sinusitis Symptoms persisting for one week, including nasal congestion, thick drainage, facial pressure, and intermittent fever. History of similar episode last year. Suspected bacterial etiology due to persistence and severity. Discussed risks of untreated bacterial sinusitis and benefits of antibiotic treatment, especially given immunocompromised status post-stem cell transplant. - Prescribe 10-day course of antibiotics, augmentin  bid  - Recommend Mucinex  or Mucinex  DM - Send prescription to pharmacy - Provide instructions in MyChart - Advise to contact if symptoms do not improve or worsen  Immunocompromised Status Post-Stem Cell Transplant Increased risk of infections due to stem cell transplant seven and a half years ago. Emphasized importance of prompt infection treatment. - Monitor for signs of infection and advise prompt medical attention if symptoms worsen  Pancreatic Cyst Small pancreatic cyst under surveillance for four years with no progression. MRI follow-up postponed due to current illness. - Reschedule MRI for pancreatic cyst follow-up after resolution of current illness.        No orders of the defined types were placed in this encounter.  No orders of the defined types were placed in this encounter.    I reviewed the patients updated PMH, FH, and SocHx.    Patient Active Problem List   Diagnosis Date Noted   Atrial fibrillation (HCC) 02/12/2022   AKI (acute kidney injury) (HCC) 02/12/2022   Leukocytosis 02/12/2022    Erectile dysfunction 01/08/2022   Fatty liver 01/05/2020   Osteopenia 10/05/2019   Allergic rhinitis 10/03/2019   Pancreatic lesion 10/03/2019   History of CVA (cerebrovascular accident) 03/10/2019   History of DVT (deep vein thrombosis) 09/25/2018   Adrenal insufficiency (HCC) 08/06/2017   Post chemotherapy Dry eyes due to decreased tear production 08/06/2017   post chemotherapy Peripheral neuropathy 08/06/2017   History of stem cell transplant (HCC) 03/04/2017   GVHD (graft versus host disease) (HCC) 03/04/2017   History of myelodysplastic syndrome s/p stem cell transplant in 2017  12/06/2015   BPH associated with nocturia 11/15/2014   Leukopenia 11/15/2014   Steroid-induced hyperglycemia 08/16/2014   Chest pain 08/13/2014   GERD (gastroesophageal reflux disease) 05/03/2014   Cervical disc disorder with radiculopathy of cervical region 07/14/2010   MIXED HEARING LOSS BILATERAL 07/14/2010   ERECTILE DYSFUNCTION 02/07/2007   Hyperlipidemia 02/04/2007   Essential hypertension 02/04/2007   Current Meds  Medication Sig   acetaminophen  (TYLENOL ) 500 MG tablet Take 500-1,000 mg by mouth every 6 (six) hours as needed for mild pain or headache.   albuterol  (VENTOLIN  HFA) 108 (90 Base) MCG/ACT inhaler Inhale 2 puffs into the lungs in the morning and at bedtime.   Alpha-Lipoic Acid 600 MG CAPS Take 600 mg by mouth daily with supper.   amLODipine  (NORVASC ) 10 MG tablet TAKE 1 TABLET BY MOUTH EVERY DAY (Patient taking differently: Take 10 mg by mouth daily.)   amoxicillin  (AMOXIL ) 500 MG capsule Take 2,000 mg by mouth See admin instructions. Take 2,000 mg by mouth one hour prior to dental appointments   apixaban  (ELIQUIS ) 5 MG TABS tablet Take 1 tablet (5 mg total) by mouth 2 (two) times daily.   Carboxymethylcellul-Glycerin (REFRESH RELIEVA OP) Place 1 drop into both eyes 4 (four) times daily.   Cholecalciferol  (VITAMIN D ) 50 MCG (2000 UT) CAPS Take 2,000 Units by mouth daily with supper.    diltiazem  (CARDIZEM ) 30 MG tablet Take 30 mg by mouth daily as needed. For elevated HR   fluticasone  (FLONASE ) 50 MCG/ACT nasal spray Place 1-2 sprays into both nostrils daily as needed (for seasonal allergies).   hydrocortisone  (CORTEF ) 20 MG tablet Take 15 mg by mouth daily.   Lancets (ONETOUCH DELICA PLUS LANCET33G) MISC    loratadine  (CLARITIN ) 10 MG tablet Take 10 mg by mouth daily as needed (for seasonal allergies).   Magnesium  Oxide 400 MG CAPS Take 1 capsule (400 mg total) by mouth daily with supper.   metFORMIN  (GLUCOPHAGE ) 500 MG tablet Take 1,000 mg by mouth 2 (two) times daily.   metoprolol  succinate (TOPROL -XL) 50 MG 24 hr tablet Take 1 tablet (50 mg total) by mouth daily. Take with or immediately following a meal.   montelukast  (SINGULAIR ) 10 MG tablet Take 10 mg by mouth at bedtime.   Multiple Vitamin (MULTI-VITAMIN) tablet Take 1 tablet by mouth daily with breakfast.   niacinamide 500 MG tablet Take 500 mg by mouth 2 (two) times daily with a meal.   omeprazole  (PRILOSEC ) 40 MG capsule Take 40 mg by mouth daily.   ondansetron  (ZOFRAN -ODT) 8 MG disintegrating tablet Take 8 mg by mouth every 8 (eight) hours as needed for  nausea.   ONETOUCH ULTRA test strip TEST 2 TIMES DAILY USE AS INSTRUCTED.   polyethylene glycol (MIRALAX  / GLYCOLAX ) 17 g packet Take 17 g by mouth See admin instructions. Mix 17 grams of powder into 4-8 ounces of water and drink one to two times a week   PRESCRIPTION MEDICATION Place 1 application  onto teeth See admin instructions. Sodium Fluoride 5000 dry mouth toothpaste- Brush teeth for 2 minutes using the toothpaste daily as directed   PULMICORT  FLEXHALER 180 MCG/ACT inhaler Inhale 1 puff into the lungs in the morning and at bedtime.   rosuvastatin  (CRESTOR ) 20 MG tablet Take 1 tablet (20 mg total) by mouth daily.   senna-docusate (SENOKOT-S) 8.6-50 MG tablet Take 2 tablets by mouth at bedtime.    sildenafil  (VIAGRA ) 100 MG tablet Take 1 tablet (100 mg total)  by mouth daily as needed for erectile dysfunction.   sodium chloride  (OCEAN) 0.65 % SOLN nasal spray Place 1-2 sprays into both nostrils in the morning and at bedtime.   venlafaxine  XR (EFFEXOR -XR) 75 MG 24 hr capsule TAKE 1 CAPSULE BY MOUTH DAILY WITH BREAKFAST.    Allergies: Patient is allergic to levofloxacin  and tape. Family History: Patient family history includes Alzheimer's disease in his father; Asperger's syndrome in his son; Breast cancer in his mother; CVA in his mother. Social History:  Patient  reports that he has never smoked. He has never used smokeless tobacco. He reports current alcohol  use of about 3.0 standard drinks of alcohol  per week. He reports that he does not use drugs.  Review of Systems: Constitutional: Negative for fever malaise or anorexia Cardiovascular: negative for chest pain Respiratory: negative for SOB or persistent cough Gastrointestinal: negative for abdominal pain  Objective  Vitals: BP 124/82   Pulse 88   Temp 98.1 F (36.7 C)   Ht 5' 10 (1.778 m)   Wt 269 lb (122 kg)   SpO2 97%   BMI 38.60 kg/m  General: no acute distress , A&Ox3, appears ill but non toxic. No resp distress HEENT: PEERL, conjunctiva normal, neck is supple, left max sinus ttp. Op clear Cardiovascular:  RRR without murmur or gallop.  Respiratory:  Good breath sounds bilaterally, CTAB with normal respiratory effort Skin:  Warm, no rashes   Commons side effects, risks, benefits, and alternatives for medications and treatment plan prescribed today were discussed, and the patient expressed understanding of the given instructions. Patient is instructed to call or message via MyChart if he/she has any questions or concerns regarding our treatment plan. No barriers to understanding were identified. We discussed Red Flag symptoms and signs in detail. Patient expressed understanding regarding what to do in case of urgent or emergency type symptoms.  Medication list was reconciled,  printed and provided to the patient in AVS. Patient instructions and summary information was reviewed with the patient as documented in the AVS. This note was prepared with assistance of Dragon voice recognition software. Occasional wrong-word or sound-a-like substitutions may have occurred due to the inherent limitations of voice recognition software

## 2023-07-05 NOTE — Telephone Encounter (Signed)
 Please schedule ov or virtual for pt with available provider.  Copied from CRM [id number]. Topic: Appointments - Appointment Scheduling >> Jul 05, 2023  8:29 AM Rosina BIRCH wrote: Patient/patient representative is calling to schedule an appointment. Refer to attachments for appointment information. Pt called stating he had a head cold for a week and he thinks he has a fever. Pt wanted a note sent to the provider for him to send a medication in. Pt told me he did not feel like going out when I tried to make him an appointment with one of the providers

## 2023-07-07 LAB — ACTH: C206 ACTH: 59 pg/mL — ABNORMAL HIGH (ref 6–50)

## 2023-08-13 DIAGNOSIS — E2740 Unspecified adrenocortical insufficiency: Secondary | ICD-10-CM | POA: Diagnosis not present

## 2023-08-13 DIAGNOSIS — R7303 Prediabetes: Secondary | ICD-10-CM | POA: Diagnosis not present

## 2023-09-03 DIAGNOSIS — R6881 Early satiety: Secondary | ICD-10-CM | POA: Diagnosis not present

## 2023-09-03 DIAGNOSIS — Z860100 Personal history of colon polyps, unspecified: Secondary | ICD-10-CM | POA: Diagnosis not present

## 2023-09-03 DIAGNOSIS — K5909 Other constipation: Secondary | ICD-10-CM | POA: Diagnosis not present

## 2023-09-03 DIAGNOSIS — Z7901 Long term (current) use of anticoagulants: Secondary | ICD-10-CM | POA: Diagnosis not present

## 2023-09-03 DIAGNOSIS — R110 Nausea: Secondary | ICD-10-CM | POA: Diagnosis not present

## 2023-09-03 DIAGNOSIS — Z9481 Bone marrow transplant status: Secondary | ICD-10-CM | POA: Diagnosis not present

## 2023-09-03 DIAGNOSIS — K862 Cyst of pancreas: Secondary | ICD-10-CM | POA: Diagnosis not present

## 2023-09-03 DIAGNOSIS — Z8719 Personal history of other diseases of the digestive system: Secondary | ICD-10-CM | POA: Diagnosis not present

## 2023-09-06 DIAGNOSIS — K8689 Other specified diseases of pancreas: Secondary | ICD-10-CM | POA: Diagnosis not present

## 2023-09-06 DIAGNOSIS — K862 Cyst of pancreas: Secondary | ICD-10-CM | POA: Diagnosis not present

## 2023-09-06 DIAGNOSIS — K760 Fatty (change of) liver, not elsewhere classified: Secondary | ICD-10-CM | POA: Diagnosis not present

## 2023-09-06 DIAGNOSIS — R978 Other abnormal tumor markers: Secondary | ICD-10-CM | POA: Diagnosis not present

## 2023-09-25 ENCOUNTER — Other Ambulatory Visit: Payer: Self-pay | Admitting: Family Medicine

## 2023-10-16 DIAGNOSIS — R001 Bradycardia, unspecified: Secondary | ICD-10-CM | POA: Diagnosis not present

## 2023-10-16 DIAGNOSIS — K2100 Gastro-esophageal reflux disease with esophagitis, without bleeding: Secondary | ICD-10-CM | POA: Diagnosis not present

## 2023-10-16 DIAGNOSIS — I10 Essential (primary) hypertension: Secondary | ICD-10-CM | POA: Diagnosis not present

## 2023-10-16 DIAGNOSIS — Z79621 Long term (current) use of calcineurin inhibitor: Secondary | ICD-10-CM | POA: Diagnosis not present

## 2023-10-16 DIAGNOSIS — E871 Hypo-osmolality and hyponatremia: Secondary | ICD-10-CM | POA: Diagnosis not present

## 2023-10-16 DIAGNOSIS — I82441 Acute embolism and thrombosis of right tibial vein: Secondary | ICD-10-CM | POA: Diagnosis not present

## 2023-10-16 DIAGNOSIS — R002 Palpitations: Secondary | ICD-10-CM | POA: Diagnosis not present

## 2023-10-16 DIAGNOSIS — E2740 Unspecified adrenocortical insufficiency: Secondary | ICD-10-CM | POA: Diagnosis not present

## 2023-10-16 DIAGNOSIS — T8609 Other complications of bone marrow transplant: Secondary | ICD-10-CM | POA: Diagnosis not present

## 2023-10-16 DIAGNOSIS — D72820 Lymphocytosis (symptomatic): Secondary | ICD-10-CM | POA: Diagnosis not present

## 2023-10-16 DIAGNOSIS — Z79899 Other long term (current) drug therapy: Secondary | ICD-10-CM | POA: Diagnosis not present

## 2023-10-16 DIAGNOSIS — D89811 Chronic graft-versus-host disease: Secondary | ICD-10-CM | POA: Diagnosis not present

## 2023-10-16 DIAGNOSIS — C9201 Acute myeloblastic leukemia, in remission: Secondary | ICD-10-CM | POA: Diagnosis not present

## 2023-10-16 DIAGNOSIS — D469 Myelodysplastic syndrome, unspecified: Secondary | ICD-10-CM | POA: Diagnosis not present

## 2023-10-16 DIAGNOSIS — Z9481 Bone marrow transplant status: Secondary | ICD-10-CM | POA: Diagnosis not present

## 2023-10-16 DIAGNOSIS — R600 Localized edema: Secondary | ICD-10-CM | POA: Diagnosis not present

## 2023-10-16 DIAGNOSIS — E7800 Pure hypercholesterolemia, unspecified: Secondary | ICD-10-CM | POA: Diagnosis not present

## 2023-11-04 ENCOUNTER — Telehealth: Payer: Self-pay | Admitting: Family Medicine

## 2023-11-04 DIAGNOSIS — R739 Hyperglycemia, unspecified: Secondary | ICD-10-CM

## 2023-11-04 DIAGNOSIS — E785 Hyperlipidemia, unspecified: Secondary | ICD-10-CM

## 2023-11-04 NOTE — Telephone Encounter (Signed)
 Pt had cmet and cbc on 04/23 and A1c in Feb. What labs would you like ordered?

## 2023-11-04 NOTE — Telephone Encounter (Signed)
 Pt is requesting to have labs prior to CPE. Please advise

## 2023-11-04 NOTE — Telephone Encounter (Signed)
 I ordered some very basic physical labs-if he wants to discuss more than this before actually having the labs drawn we will likely need to just sent down on the 22nd and discuss at his visit

## 2023-11-05 NOTE — Telephone Encounter (Signed)
Ok to schedule lab visit prior.

## 2023-11-07 ENCOUNTER — Ambulatory Visit: Payer: Self-pay | Admitting: Family Medicine

## 2023-11-07 ENCOUNTER — Other Ambulatory Visit (INDEPENDENT_AMBULATORY_CARE_PROVIDER_SITE_OTHER): Payer: Medicare Other

## 2023-11-07 DIAGNOSIS — R739 Hyperglycemia, unspecified: Secondary | ICD-10-CM

## 2023-11-07 DIAGNOSIS — T380X5A Adverse effect of glucocorticoids and synthetic analogues, initial encounter: Secondary | ICD-10-CM

## 2023-11-07 DIAGNOSIS — E785 Hyperlipidemia, unspecified: Secondary | ICD-10-CM

## 2023-11-07 LAB — CBC WITH DIFFERENTIAL/PLATELET
Basophils Absolute: 0.1 10*3/uL (ref 0.0–0.1)
Basophils Relative: 0.6 % (ref 0.0–3.0)
Eosinophils Absolute: 0.3 10*3/uL (ref 0.0–0.7)
Eosinophils Relative: 2.5 % (ref 0.0–5.0)
HCT: 40.7 % (ref 39.0–52.0)
Hemoglobin: 13.6 g/dL (ref 13.0–17.0)
Lymphocytes Relative: 40.1 % (ref 12.0–46.0)
Lymphs Abs: 5.2 10*3/uL — ABNORMAL HIGH (ref 0.7–4.0)
MCHC: 33.5 g/dL (ref 30.0–36.0)
MCV: 90 fl (ref 78.0–100.0)
Monocytes Absolute: 1.4 10*3/uL — ABNORMAL HIGH (ref 0.1–1.0)
Monocytes Relative: 10.8 % (ref 3.0–12.0)
Neutro Abs: 6 10*3/uL (ref 1.4–7.7)
Neutrophils Relative %: 46 % (ref 43.0–77.0)
Platelets: 414 10*3/uL — ABNORMAL HIGH (ref 150.0–400.0)
RBC: 4.52 Mil/uL (ref 4.22–5.81)
RDW: 15.5 % (ref 11.5–15.5)
WBC: 12.9 10*3/uL — ABNORMAL HIGH (ref 4.0–10.5)

## 2023-11-07 LAB — COMPREHENSIVE METABOLIC PANEL WITH GFR
ALT: 25 U/L (ref 0–53)
AST: 25 U/L (ref 0–37)
Albumin: 4.2 g/dL (ref 3.5–5.2)
Alkaline Phosphatase: 62 U/L (ref 39–117)
BUN: 18 mg/dL (ref 6–23)
CO2: 25 meq/L (ref 19–32)
Calcium: 9.6 mg/dL (ref 8.4–10.5)
Chloride: 99 meq/L (ref 96–112)
Creatinine, Ser: 1.28 mg/dL (ref 0.40–1.50)
GFR: 55.25 mL/min — ABNORMAL LOW (ref >60.00)
Glucose, Bld: 101 mg/dL — ABNORMAL HIGH (ref 70–99)
Potassium: 4.1 meq/L (ref 3.5–5.1)
Sodium: 133 meq/L — ABNORMAL LOW (ref 135–145)
Total Bilirubin: 0.4 mg/dL (ref 0.2–1.2)
Total Protein: 7.7 g/dL (ref 6.0–8.3)

## 2023-11-07 LAB — HEMOGLOBIN A1C: Hgb A1c MFr Bld: 6.6 % — ABNORMAL HIGH (ref 4.6–6.5)

## 2023-11-07 LAB — LIPID PANEL
Cholesterol: 125 mg/dL (ref 0–200)
HDL: 44.8 mg/dL (ref >39.00)
LDL Cholesterol: 28 mg/dL (ref 0–99)
NonHDL: 80.06
Total CHOL/HDL Ratio: 3
Triglycerides: 258 mg/dL — ABNORMAL HIGH (ref 0.0–149.0)
VLDL: 51.6 mg/dL — ABNORMAL HIGH (ref 0.0–40.0)

## 2023-11-08 DIAGNOSIS — R978 Other abnormal tumor markers: Secondary | ICD-10-CM | POA: Diagnosis not present

## 2023-11-08 DIAGNOSIS — K862 Cyst of pancreas: Secondary | ICD-10-CM | POA: Diagnosis not present

## 2023-11-08 DIAGNOSIS — D72820 Lymphocytosis (symptomatic): Secondary | ICD-10-CM | POA: Diagnosis not present

## 2023-11-08 DIAGNOSIS — T8609 Other complications of bone marrow transplant: Secondary | ICD-10-CM | POA: Diagnosis not present

## 2023-11-08 DIAGNOSIS — D469 Myelodysplastic syndrome, unspecified: Secondary | ICD-10-CM | POA: Diagnosis not present

## 2023-11-08 DIAGNOSIS — E559 Vitamin D deficiency, unspecified: Secondary | ICD-10-CM | POA: Diagnosis not present

## 2023-11-08 DIAGNOSIS — D89811 Chronic graft-versus-host disease: Secondary | ICD-10-CM | POA: Diagnosis not present

## 2023-11-14 ENCOUNTER — Ambulatory Visit (INDEPENDENT_AMBULATORY_CARE_PROVIDER_SITE_OTHER): Payer: Medicare Other | Admitting: Family Medicine

## 2023-11-14 ENCOUNTER — Encounter: Payer: Self-pay | Admitting: Family Medicine

## 2023-11-14 ENCOUNTER — Ambulatory Visit: Payer: Self-pay | Admitting: Family Medicine

## 2023-11-14 ENCOUNTER — Telehealth: Payer: Self-pay | Admitting: Family Medicine

## 2023-11-14 VITALS — BP 110/64 | HR 80 | Temp 97.4°F | Ht 70.0 in | Wt 270.2 lb

## 2023-11-14 DIAGNOSIS — R944 Abnormal results of kidney function studies: Secondary | ICD-10-CM

## 2023-11-14 DIAGNOSIS — I10 Essential (primary) hypertension: Secondary | ICD-10-CM

## 2023-11-14 DIAGNOSIS — E785 Hyperlipidemia, unspecified: Secondary | ICD-10-CM | POA: Diagnosis not present

## 2023-11-14 DIAGNOSIS — M79641 Pain in right hand: Secondary | ICD-10-CM

## 2023-11-14 DIAGNOSIS — R739 Hyperglycemia, unspecified: Secondary | ICD-10-CM

## 2023-11-14 DIAGNOSIS — T380X5A Adverse effect of glucocorticoids and synthetic analogues, initial encounter: Secondary | ICD-10-CM

## 2023-11-14 DIAGNOSIS — K429 Umbilical hernia without obstruction or gangrene: Secondary | ICD-10-CM | POA: Diagnosis not present

## 2023-11-14 DIAGNOSIS — R351 Nocturia: Secondary | ICD-10-CM | POA: Diagnosis not present

## 2023-11-14 DIAGNOSIS — E2740 Unspecified adrenocortical insufficiency: Secondary | ICD-10-CM

## 2023-11-14 DIAGNOSIS — M79642 Pain in left hand: Secondary | ICD-10-CM

## 2023-11-14 LAB — MICROALBUMIN / CREATININE URINE RATIO
Creatinine,U: 116 mg/dL
Microalb Creat Ratio: 22.7 mg/g (ref 0.0–30.0)
Microalb, Ur: 2.6 mg/dL — ABNORMAL HIGH (ref 0.0–1.9)

## 2023-11-14 NOTE — Telephone Encounter (Signed)
 Ok to schedule.

## 2023-11-14 NOTE — Patient Instructions (Addendum)
 Schedule labs for 2nd or 3rd week of August on way out- you can tell them I already ordered on  Please stop by lab before you go- urine only If you have mychart- we will send your results within 3 business days of us  receiving them.  If you do not have mychart- we will call you about results within 5 business days of us  receiving them.  *please also note that you will see labs on mychart as soon as they post. I will later go in and write notes on them- will say "notes from Dr. Arlene Ben"   Tetanus, Diphtheria, and Pertussis (Tdap) at pharmacy and let us  know the date since has been 5 years but could wait up to 7   We have placed a referral for you today to [facility] [place] [facility]- please call their # if you do not hear within a week  Phone: [phone]   Recommended follow up: Return in about 6 months (around 05/16/2024) for followup or sooner if needed.Schedule b4 you leave.

## 2023-11-14 NOTE — Progress Notes (Signed)
 Phone [phone] In person visit   Subjective:   Brandon [name] is a 73 y.o. year old very pleasant male patient who presents for/with See problem oriented charting Chief Complaint  Patient presents with   Annual Exam    Already had labs. Colonoscopy scheduled for 05/28. Denies issues/ROS.   Hypertension    Past Medical History-  Patient Active Problem List   Diagnosis Date Noted   Atrial fibrillation (HCC) 02/12/2022    Priority: High   Allergic rhinitis 10/03/2019    Priority: High   Pancreatic lesion 10/03/2019    Priority: High   History of CVA (cerebrovascular accident) 03/10/2019    Priority: High   Adrenal insufficiency (HCC) 08/06/2017    Priority: High   GVHD (graft versus host disease) (HCC) 03/04/2017    Priority: High   History of myelodysplastic syndrome s/p stem cell transplant in 2017  12/06/2015    Priority: High   Steroid-induced hyperglycemia 08/16/2014    Priority: High   Erectile dysfunction 01/08/2022    Priority: Medium    Fatty liver 01/05/2020    Priority: Medium    Osteopenia 10/05/2019    Priority: Medium    History of DVT (deep vein thrombosis) 09/25/2018    Priority: Medium    Post chemotherapy Dry eyes due to decreased tear production 08/06/2017    Priority: Medium    post chemotherapy Peripheral neuropathy 08/06/2017    Priority: Medium    BPH associated with nocturia 11/15/2014    Priority: Medium    Hyperlipidemia 02/04/2007    Priority: Medium    Essential hypertension 02/04/2007    Priority: Medium    History of stem cell transplant (HCC) 03/04/2017    Priority: Low   Leukopenia 11/15/2014    Priority: Low   Chest pain 08/13/2014    Priority: Low   GERD (gastroesophageal reflux disease) 05/03/2014    Priority: Low   Cervical disc disorder with radiculopathy of cervical region 07/14/2010    Priority: Low   MIXED HEARING LOSS BILATERAL 07/14/2010    Priority: Low   ERECTILE DYSFUNCTION 02/07/2007    Priority: Low    AKI (acute kidney injury) (HCC) 02/12/2022   Leukocytosis 02/12/2022    Medications- reviewed and updated Current Outpatient Medications  Medication Sig Dispense Refill   loratadine  (CLARITIN ) 10 MG tablet Take 10 mg by mouth daily as needed (for seasonal allergies).     RESTASIS 0.05 % ophthalmic emulsion Apply 1 drop to eye.     acetaminophen  (TYLENOL ) 500 MG tablet Take 500-1,000 mg by mouth every 6 (six) hours as needed for mild pain or headache.     albuterol  (VENTOLIN  HFA) 108 (90 Base) MCG/ACT inhaler Inhale 2 puffs into the lungs in the morning and at bedtime.     amLODipine  (NORVASC ) 10 MG tablet TAKE 1 TABLET BY MOUTH EVERY DAY (Patient taking differently: Take 10 mg by mouth daily.) 90 tablet 3   amoxicillin  (AMOXIL ) 500 MG capsule Take 2,000 mg by mouth See admin instructions. Take 2,000 mg by mouth one hour prior to dental appointments     apixaban  (ELIQUIS ) 5 MG TABS tablet Take 1 tablet (5 mg total) by mouth 2 (two) times daily. 60 tablet 3   Carboxymethylcellul-Glycerin (REFRESH RELIEVA OP) Place 1 drop into both eyes 4 (four) times daily.     Cholecalciferol  (VITAMIN D ) 50 MCG (2000 UT) CAPS Take 2,000 Units by mouth daily with supper.     diltiazem  (CARDIZEM ) 30 MG tablet Take 30 mg  by mouth daily as needed. For elevated HR (Patient not taking: Reported on 11/14/2023)     fluticasone  (FLONASE ) 50 MCG/ACT nasal spray Place 1-2 sprays into both nostrils daily as needed (for seasonal allergies). (Patient not taking: Reported on 11/14/2023)     Lancets (ONETOUCH DELICA PLUS LANCET33G) MISC      LINZESS 145 MCG CAPS capsule Take 145 mcg by mouth every morning.     Magnesium  Oxide 400 MG CAPS Take 1 capsule (400 mg total) by mouth daily with supper.     metFORMIN  (GLUCOPHAGE ) 500 MG tablet Take 1,000 mg by mouth 2 (two) times daily.     metoprolol  succinate (TOPROL -XL) 50 MG 24 hr tablet Take 1 tablet (50 mg total) by mouth daily. Take with or immediately following a meal. 90 tablet 3    Multiple Vitamin (MULTI-VITAMIN) tablet Take 1 tablet by mouth daily with breakfast.     niacinamide 500 MG tablet Take 500 mg by mouth 2 (two) times daily with a meal.     omeprazole  (PRILOSEC ) 40 MG capsule Take 40 mg by mouth daily.     ONETOUCH ULTRA test strip TEST 2 TIMES DAILY USE AS INSTRUCTED.     PRESCRIPTION MEDICATION Place 1 application  onto teeth See admin instructions. Sodium Fluoride 5000 dry mouth toothpaste- Brush teeth for 2 minutes using the toothpaste daily as directed     PULMICORT  FLEXHALER 180 MCG/ACT inhaler Inhale 1 puff into the lungs in the morning and at bedtime.     rosuvastatin  (CRESTOR ) 20 MG tablet Take 1 tablet (20 mg total) by mouth daily. 90 tablet 3   sildenafil  (VIAGRA ) 100 MG tablet Take 1 tablet (100 mg total) by mouth daily as needed for erectile dysfunction. 30 tablet 3   sodium chloride  (OCEAN) 0.65 % SOLN nasal spray Place 1-2 sprays into both nostrils in the morning and at bedtime.     venlafaxine  XR (EFFEXOR -XR) 75 MG 24 hr capsule TAKE 1 CAPSULE BY MOUTH DAILY WITH BREAKFAST. 90 capsule 2   No current facility-administered medications for this visit.     Objective:  BP 110/64   Pulse 80   Temp (!) 97.4 F (36.3 C)   Ht 5\' 10"  (1.778 m)   Wt 270 lb 3.2 oz (122.6 kg)   SpO2 96%   BMI 38.77 kg/m  Gen: NAD, resting comfortably CV: RRR no murmurs rubs or gallops Lungs: CTAB no crackles, wheeze, rhonchi Abdomen: soft/nontender/nondistended/normal bowel sounds. No rebound or guarding. Umbilical hernia note-D minimal tenderness Ext: no edema Skin: warm, dry     Assessment and Plan   # Health maintenance - Upcoming colonoscopy on May 28 and endoscopy planned - Past age-based screening recommendations for prostate cancer- but is having some increasing nocturia over last few years- we will check with future [facility] labs Lab Results  Component Value Date   PSA 2.29 04/21/2020   PSA 2.46 05/04/2015   PSA 3.11 11/04/2014  - Not a candidate  for lung cancer screening - Immunizations-fully up-to-date other than [facility] recommended Tdap 5 to 7 years out from last vaccination which was 2020  Immunization History  Administered Date(s) Administered   DTaP / HiB / IPV 12/04/2017, 03/05/2018, 08/27/2018   Fluad Quad(high Dose 65+) 03/17/2020, 03/23/2022   Hepatitis A 12/25/2005, 11/25/2007   Hepatitis A, Adult 12/25/2005, 11/28/2007   Hepatitis B 05/25/1996, 06/25/1997, 12/01/1998   Hepatitis B, ADULT 12/04/2017, 03/05/2018, 08/27/2018   Influenza, High Dose Seasonal PF 07/09/2017, 04/16/2018, 02/17/2019, 03/29/2021, 03/23/2022, 03/19/2023  Influenza,inj,Quad PF,6+ Mos 02/18/2013, 05/03/2014   Influenza-Unspecified 04/02/1996, 04/19/1997, 03/16/2015, 02/17/2019, 03/19/2023   MMR 08/26/2020   Moderna Covid-19 Fall Seasonal Vaccine 70yrs & older 03/19/2023, 11/07/2023   Novel Infuenza-h1n1-09 07/01/2008   PFIZER Comirnaty(Gray Top)Covid-19 Tri-Sucrose Vaccine 03/23/2022   PFIZER(Purple Top)SARS-COV-2 Vaccination 07/28/2019, 08/20/2019, 02/10/2020, 07/26/2020   PPD Test 08/15/2012   Pfizer Covid-19 Vaccine Bivalent Booster 39yrs & up 04/19/2021, 10/18/2021   Pneumococcal Conjugate-13 05/11/2015, 12/04/2017, 03/05/2018, 08/27/2018   Pneumococcal Polysaccharide-23 05/03/2014, 05/09/2020   Respiratory Syncytial Virus Vaccine,Recomb Aduvanted(Arexvy) 05/06/2023   Rsv, Bivalent, Protein Subunit Rsvpref,pf Pattricia Bores) 05/06/2023   Td 12/05/2005   Tdap 12/05/2005, 08/27/2018   Typhoid Live 12/05/2005   Unspecified SARS-COV-2 Vaccination 03/19/2023   Yellow Fever 12/05/2005   Zoster Recombinant(Shingrix) 04/19/2021, 10/18/2021   Zoster, Live 02/15/2012  -Mild osteopenia October 2023 had improved into normal range and now off steroids- could consider 3 year repeat vs holding off  #umbilical hernia- refer to general surgeon  #Bilateral hand pain- fingers metacarpophalangeal joint throughout and then proximal interphalangeal on the right worse  than the left- morning stiffness and pain noted that gets better as day goes on. Can throb in morning.   # Lab review "A1c stable at 6.6 to slightly down.  LDL at goal but triglycerides somewhat high-healthy eating/regular exercise/weight loss may help.  White blood cell count remains very slightly elevated.  Platelets very slightly elevated.  Continue to monitor both.  Sodium just a hair low and mild decrease in kidney function-may be worth doing a urine diabetes test at time of visit with the reduction in kidney function"  -Recommend check UACR today  -triglyceride(s) elevation may have not been fasting  #high white blood cell(s) - had flow cytometry with [facility] and thankfully low risk. Platelets mildly high  # Pancreatic cyst-follow up MRI of the abd- pancreas focus- now followed at [facility] -Stable on 09/06/2023 MRI -CA 19-9 actually slightly trending down   #History of myelodysplastic syndrome that transitioned to AML history-has continued to do well and now over 7 years out from stem cell transplant   -thankfully off steroids since mid September 2024!   #Graft-versus-host disease-on Singulair  and Pulmicortin the past- just pulmicort  and albuterol  now -2024 November coming off singulair    # Atrial fibrillation-new onset August 2023- follows at [facility] #history of stroke prior to detection- thankfully mild S: Rate controlled with metoprolol  50 mg XR daily, diltiazem  30 mg daily as needed for elevated heart rate Anticoagulated with Eliquis  5 mg twice daily- will hold for colonoscopy A/P:  appropriately anticoagulated and rate controlled- continue current medicine   #hypertension S: medication: Amlodipine  10 mg,  metoprolol  50 mg   BP Readings from Last 3 Encounters:  11/14/23 110/64  07/05/23 124/82  05/15/23 130/72  A/P:  well controlled continue current medications   #hyperlipidemia S: Medication:Rosuvastatin  40 mg daily--> 20 mg Lab Results  Component Value Date   CHOL 125 11/07/2023    HDL 44.80 11/07/2023   LDLCALC 28 11/07/2023   LDLDIRECT 77 02/17/2020   TRIG 258.0 (H) 11/07/2023   CHOLHDL 3 11/07/2023  A/P:  well controlled - if not for triglyceride(s) could honestly decrease rosuvastatin     #Steroid induced hyperglycemia S: Medication: Metformin  1000 mg twice daily Lab Results  Component Value Date   HGBA1C 6.6 (H) 11/07/2023   HGBA1C 6.8 (H) 05/13/2023   HGBA1C 6.7 (H) 01/02/2023  A/P: reasonable control- trending down now off steroids- hold off on changing title to diabetes unless persists over another year probably- may  hold metformin  around time of colonoscopy  #/Depressed mood S: Medication: Venlafaxine /Effexor  75 mg XR    07/05/2023   10:01 AM 05/15/2023   10:42 AM 01/07/2023    8:25 AM  Depression screen PHQ 2/9  Decreased Interest 0 0 0  Down, Depressed, Hopeless 0 0 0  PHQ - 2 Score 0 0 0  Altered sleeping 0 0   Tired, decreased energy 0 0   Change in appetite 0 0   Feeling bad or failure about yourself  0 0   Trouble concentrating 0 0   Moving slowly or fidgety/restless 0 0   Suicidal thoughts 0 0   PHQ-9 Score 0 0   Difficult doing work/chores Not difficult at all Not difficult at all   A/P: full remission- continue current medications     # GERD S:Medication:  omeprazole  40 mg per [facility] in January 2024 A/P: doing well- continue current medications    Recommended follow up: Return in about 6 months (around 05/16/2024) for followup or sooner if needed.Schedule b4 you leave. Future Appointments  Date Time Provider Department Center  01/13/2024  7:45 AM [facility] ANNUAL WELLNESS VISIT 1 [facility]    Lab/Order associations:   ICD-10-CM   1. Steroid-induced hyperglycemia  R73.9    T38.0X5A     2. Essential hypertension  I10     3. Decreased GFR  R94.4 Microalbumin / creatinine urine ratio    4. Insufficiency, adrenal (HCC)  E27.40 ACTH     Cortisol    Renal Function Panel    5. Nocturia  R35.1 PSA    6. Hyperlipidemia,  unspecified hyperlipidemia type  E78.5     7. Bilateral hand pain  M79.641 Rheumatoid Factor   M79.642 ANA w/Reflex if Positive    8. Umbilical hernia without obstruction and without gangrene  K42.9 Ambulatory referral to General Surgery      No orders of the defined types were placed in this encounter.   Return precautions advised.  Clarisa Crooked, MD

## 2023-11-14 NOTE — Telephone Encounter (Signed)
 Pt has been scheduled for additional lab visit on 05/13/24 @ 8:30 am.

## 2023-11-14 NOTE — Telephone Encounter (Signed)
 Patient has been scheduled for lab visit on 8/22 @ 9 am and for 6 month f/u on 11/24 @ 9:20 am. Patient requested to be scheduled for labs the week prior to his 6 month f/u. Please advise.

## 2023-11-21 DIAGNOSIS — Z09 Encounter for follow-up examination after completed treatment for conditions other than malignant neoplasm: Secondary | ICD-10-CM | POA: Diagnosis not present

## 2023-11-21 DIAGNOSIS — D124 Benign neoplasm of descending colon: Secondary | ICD-10-CM | POA: Diagnosis not present

## 2023-11-21 DIAGNOSIS — I10 Essential (primary) hypertension: Secondary | ICD-10-CM | POA: Diagnosis not present

## 2023-11-21 DIAGNOSIS — Z1211 Encounter for screening for malignant neoplasm of colon: Secondary | ICD-10-CM | POA: Diagnosis not present

## 2023-11-21 DIAGNOSIS — Z79899 Other long term (current) drug therapy: Secondary | ICD-10-CM | POA: Diagnosis not present

## 2023-11-21 DIAGNOSIS — D122 Benign neoplasm of ascending colon: Secondary | ICD-10-CM | POA: Diagnosis not present

## 2023-11-21 DIAGNOSIS — D123 Benign neoplasm of transverse colon: Secondary | ICD-10-CM | POA: Diagnosis not present

## 2023-11-21 DIAGNOSIS — D120 Benign neoplasm of cecum: Secondary | ICD-10-CM | POA: Diagnosis not present

## 2023-11-21 DIAGNOSIS — K2950 Unspecified chronic gastritis without bleeding: Secondary | ICD-10-CM | POA: Diagnosis not present

## 2023-11-21 DIAGNOSIS — G4730 Sleep apnea, unspecified: Secondary | ICD-10-CM | POA: Diagnosis not present

## 2023-11-21 DIAGNOSIS — R112 Nausea with vomiting, unspecified: Secondary | ICD-10-CM | POA: Diagnosis not present

## 2023-11-21 DIAGNOSIS — K644 Residual hemorrhoidal skin tags: Secondary | ICD-10-CM | POA: Diagnosis not present

## 2023-11-21 DIAGNOSIS — K3189 Other diseases of stomach and duodenum: Secondary | ICD-10-CM | POA: Diagnosis not present

## 2023-11-21 DIAGNOSIS — K648 Other hemorrhoids: Secondary | ICD-10-CM | POA: Diagnosis not present

## 2023-11-21 DIAGNOSIS — K5730 Diverticulosis of large intestine without perforation or abscess without bleeding: Secondary | ICD-10-CM | POA: Diagnosis not present

## 2023-11-21 DIAGNOSIS — Z860101 Personal history of adenomatous and serrated colon polyps: Secondary | ICD-10-CM | POA: Diagnosis not present

## 2023-11-21 DIAGNOSIS — Z881 Allergy status to other antibiotic agents status: Secondary | ICD-10-CM | POA: Diagnosis not present

## 2023-11-21 DIAGNOSIS — K219 Gastro-esophageal reflux disease without esophagitis: Secondary | ICD-10-CM | POA: Diagnosis not present

## 2023-11-28 DIAGNOSIS — E2749 Other adrenocortical insufficiency: Secondary | ICD-10-CM | POA: Diagnosis not present

## 2023-11-28 DIAGNOSIS — Z9484 Stem cells transplant status: Secondary | ICD-10-CM | POA: Diagnosis not present

## 2023-11-28 DIAGNOSIS — K429 Umbilical hernia without obstruction or gangrene: Secondary | ICD-10-CM | POA: Diagnosis not present

## 2023-11-28 DIAGNOSIS — G4733 Obstructive sleep apnea (adult) (pediatric): Secondary | ICD-10-CM | POA: Diagnosis not present

## 2023-11-28 DIAGNOSIS — Z86718 Personal history of other venous thrombosis and embolism: Secondary | ICD-10-CM | POA: Diagnosis not present

## 2023-11-28 DIAGNOSIS — I480 Paroxysmal atrial fibrillation: Secondary | ICD-10-CM | POA: Diagnosis not present

## 2023-12-03 DIAGNOSIS — Z8719 Personal history of other diseases of the digestive system: Secondary | ICD-10-CM | POA: Diagnosis not present

## 2023-12-03 DIAGNOSIS — K5909 Other constipation: Secondary | ICD-10-CM | POA: Diagnosis not present

## 2023-12-03 DIAGNOSIS — K862 Cyst of pancreas: Secondary | ICD-10-CM | POA: Diagnosis not present

## 2023-12-03 DIAGNOSIS — R110 Nausea: Secondary | ICD-10-CM | POA: Diagnosis not present

## 2023-12-03 DIAGNOSIS — Z860100 Personal history of colon polyps, unspecified: Secondary | ICD-10-CM | POA: Diagnosis not present

## 2023-12-03 DIAGNOSIS — K2950 Unspecified chronic gastritis without bleeding: Secondary | ICD-10-CM | POA: Diagnosis not present

## 2023-12-06 DIAGNOSIS — R0689 Other abnormalities of breathing: Secondary | ICD-10-CM | POA: Diagnosis not present

## 2023-12-06 DIAGNOSIS — Z8709 Personal history of other diseases of the respiratory system: Secondary | ICD-10-CM | POA: Diagnosis not present

## 2023-12-06 DIAGNOSIS — J449 Chronic obstructive pulmonary disease, unspecified: Secondary | ICD-10-CM | POA: Diagnosis not present

## 2023-12-06 DIAGNOSIS — R0600 Dyspnea, unspecified: Secondary | ICD-10-CM | POA: Diagnosis not present

## 2023-12-06 DIAGNOSIS — D89811 Chronic graft-versus-host disease: Secondary | ICD-10-CM | POA: Diagnosis not present

## 2024-01-13 ENCOUNTER — Ambulatory Visit (INDEPENDENT_AMBULATORY_CARE_PROVIDER_SITE_OTHER): Payer: Medicare Other

## 2024-01-13 VITALS — Ht 70.0 in | Wt 215.0 lb

## 2024-01-13 DIAGNOSIS — Z0000 Encounter for general adult medical examination without abnormal findings: Secondary | ICD-10-CM | POA: Diagnosis not present

## 2024-01-13 NOTE — Patient Instructions (Signed)
 Brandon [name] , Thank you for taking time out of your busy schedule to complete your Annual Wellness Visit with me. I enjoyed our conversation and look forward to speaking with you again next year. I, as well as your care team,  appreciate your ongoing commitment to your health goals. Please review the following plan we discussed and let me know if I can assist you in the future. Your Game plan/ To Do List    Referrals: If you haven't heard from the office you've been referred to, please reach out to them at the phone provided.   Follow up Visits: Next Medicare AWV with our clinical staff: 01/18/25   Have you seen your provider in the last 6 months (3 months if uncontrolled diabetes)? Yes Next Office Visit with your provider: 05/18/24  Clinician Recommendations:  Aim for 30 minutes of exercise or brisk walking, 6-8 glasses of water, and 5 servings of fruits and vegetables each day.       This is a list of the screening recommended for you and due dates:  Health Maintenance  Topic Date Due   COVID-19 Vaccine (10 - 2024-25 season) 01/02/2024   Flu Shot  01/24/2024   Medicare Annual Wellness Visit  01/12/2025   DEXA scan (bone density measurement)  04/16/2025   DTaP/Tdap/Td vaccine (7 - Td or Tdap) 08/26/2028   Colon Cancer Screening  11/20/2028   Pneumococcal Vaccine for age over 54  Completed   Hepatitis B Vaccine  Completed   Hepatitis C Screening  Completed   Zoster (Shingles) Vaccine  Completed   HPV Vaccine  Aged Out   Meningitis B Vaccine  Aged Out    Advanced directives: (Copy Requested) Please bring a copy of your health care power of attorney and living will to the office to be added to your chart at your convenience. You can mail to [facility] [address] [place], [place] [address] or email to [email or web address] Advance Care Planning is important because it:  [x]  Makes sure you receive the medical care that is consistent with your values, goals,  and preferences  [x]  It provides guidance to your family and loved ones and reduces their decisional burden about whether or not they are making the right decisions based on your wishes.  Follow the link provided in your after visit summary or read over the paperwork we have mailed to you to help you started getting your Advance Directives in place. If you need assistance in completing these, please reach out to us  so that we can help you!  See attachments for Preventive Care and Fall Prevention Tips.

## 2024-01-13 NOTE — Progress Notes (Signed)
 Subjective:   Brandon [name] is a 73 y.o. who presents for a Medicare Wellness preventive visit.  As a reminder, Annual Wellness Visits don't include a physical exam, and some assessments may be limited, especially if this visit is performed virtually. We may recommend an in-person follow-up visit with your provider if needed.  Visit Complete: Virtual I connected with  Brandon [name] on 01/13/24 by a audio enabled telemedicine application and verified that I am speaking with the correct person using two identifiers.  Patient Location: Home  Provider Location: Office/Clinic  I discussed the limitations of evaluation and management by telemedicine. The patient expressed understanding and agreed to proceed.  Vital Signs: Because this visit was a virtual/telehealth visit, some criteria may be missing or patient reported. Any vitals not documented were not able to be obtained and vitals that have been documented are patient reported.  VideoDeclined- This patient declined Librarian, academic. Therefore the visit was completed with audio only.  Persons Participating in Visit: Patient.  AWV Questionnaire: Yes: Patient Medicare AWV questionnaire was completed by the patient on 01/12/24; I have confirmed that all information answered by patient is correct and no changes since this date.  Cardiac Risk Factors include: advanced age (>86men, >30 women);dyslipidemia;hypertension;male gender;obesity (BMI >30kg/m2)     Objective:    Today's Vitals   01/13/24 0807  Weight: 215 lb (97.5 kg)  Height: 5' 10 (1.778 m)   Body mass index is 30.85 kg/m.     01/13/2024    8:11 AM 01/07/2023    8:27 AM 02/12/2022    9:30 AM 12/25/2021    9:09 AM 09/08/2020    8:20 AM 09/09/2019    2:30 AM 09/08/2019    7:30 PM  Advanced Directives  Does Patient Have a Medical Advance Directive? Yes Yes Yes Yes Yes Yes Yes  Type of Estate agent of [place];Living will  Healthcare Power of [place];Living will Living will Healthcare Power of Attorney Living will;Healthcare Power of [employer] Power of [place];Living will Healthcare Power of [place];Living will  Does patient want to make changes to medical advance directive?      No - Patient declined No - Patient declined  Copy of Healthcare Power of Attorney in Chart? No - copy requested No - copy requested  No - copy requested No - copy requested  No - copy requested    Current Medications (verified) Outpatient Encounter Medications as of 01/13/2024  Medication Sig   acetaminophen  (TYLENOL ) 500 MG tablet Take 500-1,000 mg by mouth every 6 (six) hours as needed for mild pain or headache.   albuterol  (VENTOLIN  HFA) 108 (90 Base) MCG/ACT inhaler Inhale 2 puffs into the lungs in the morning and at bedtime.   amLODipine  (NORVASC ) 10 MG tablet TAKE 1 TABLET BY MOUTH EVERY DAY   amoxicillin  (AMOXIL ) 500 MG capsule Take 2,000 mg by mouth See admin instructions. Take 2,000 mg by mouth one hour prior to dental appointments   apixaban  (ELIQUIS ) 5 MG TABS tablet Take 1 tablet (5 mg total) by mouth 2 (two) times daily.   Carboxymethylcellul-Glycerin (REFRESH RELIEVA OP) Place 1 drop into both eyes 4 (four) times daily.   Cholecalciferol  (VITAMIN D ) 50 MCG (2000 UT) CAPS Take 2,000 Units by mouth daily with supper.   diltiazem  (CARDIZEM ) 30 MG tablet Take 30 mg by mouth daily as needed. For elevated HR   fluticasone  (FLONASE ) 50 MCG/ACT nasal spray Place 1-2 sprays into both nostrils daily as needed (  for seasonal allergies).   Lancets (ONETOUCH DELICA PLUS LANCET33G) MISC    loratadine  (CLARITIN ) 10 MG tablet Take 10 mg by mouth daily as needed (for seasonal allergies).   Magnesium  Oxide 400 MG CAPS Take 1 capsule (400 mg total) by mouth daily with supper.   metFORMIN  (GLUCOPHAGE ) 500 MG tablet Take 1,000 mg by mouth 2 (two) times daily.   metoprolol  succinate (TOPROL -XL) 50 MG 24 hr tablet Take 1 tablet (50 mg  total) by mouth daily. Take with or immediately following a meal.   Multiple Vitamin (MULTI-VITAMIN) tablet Take 1 tablet by mouth daily with breakfast.   niacinamide 500 MG tablet Take 500 mg by mouth 2 (two) times daily with a meal.   omeprazole  (PRILOSEC ) 40 MG capsule Take 40 mg by mouth daily.   ONETOUCH ULTRA test strip TEST 2 TIMES DAILY USE AS INSTRUCTED.   PRESCRIPTION MEDICATION Place 1 application  onto teeth See admin instructions. Sodium Fluoride 5000 dry mouth toothpaste- Brush teeth for 2 minutes using the toothpaste daily as directed   PULMICORT  FLEXHALER 180 MCG/ACT inhaler Inhale 1 puff into the lungs in the morning and at bedtime.   RESTASIS 0.05 % ophthalmic emulsion Apply 1 drop to eye.   rosuvastatin  (CRESTOR ) 20 MG tablet Take 1 tablet (20 mg total) by mouth daily.   sildenafil  (VIAGRA ) 100 MG tablet Take 1 tablet (100 mg total) by mouth daily as needed for erectile dysfunction.   sodium chloride  (OCEAN) 0.65 % SOLN nasal spray Place 1-2 sprays into both nostrils in the morning and at bedtime.   venlafaxine  XR (EFFEXOR -XR) 75 MG 24 hr capsule TAKE 1 CAPSULE BY MOUTH DAILY WITH BREAKFAST.   LINZESS 145 MCG CAPS capsule Take 145 mcg by mouth every morning. (Patient not taking: Reported on 01/13/2024)   No facility-administered encounter medications on file as of 01/13/2024.    Allergies (verified) Levofloxacin  and Tape   History: Past Medical History:  Diagnosis Date   Adrenocortical insufficiency (HCC) 08/06/2017   Patient with 2 hospitalizations in January 2019- treated with fluids, antibiotics, steroids- ultimately thought most likely due to adrenal insufficiency with no obvious source of infection found   Allergy    seasonal/animals   Chronic prostatitis 12/17/2008   Diverticulosis 01/16/2011   History of diverticulitis    ERECTILE DYSFUNCTION 02/07/2007   HYPERLIPIDEMIA 02/04/2007   HYPERTENSION 02/04/2007   NEOPLASM, MALIGNANT, SKIN, BACK 02/09/2009   Pneumonia,  viral 12/2017   [facility] bone marrow [facility])    Past Surgical History:  Procedure Laterality Date   CATARACT EXTRACTION Left 06/2018   CATARACT EXTRACTION Right 10/08/2019   none     SCALP LESION REMOVAL W/ FLAP AND SKIN GRAFT Left    Family History  Problem Relation Age of Onset   Breast cancer Mother    CVA Mother        quit taking HTN meds   Alzheimer's disease Father        late 97s. mid 89s   Asperger's syndrome Son    Social History   Socioeconomic History   Marital status: Married    Spouse name: Not on file   Number of children: Not on file   Years of education: Not on file   Highest education level: Bachelor's degree (e.g., BA, AB, BS)  Occupational History   Not on file  Tobacco Use   Smoking status: Never   Smokeless tobacco: Never  Substance and Sexual Activity   Alcohol  use: Yes    Alcohol /week:  3.0 standard drinks of alcohol     Types: 3 Standard drinks or equivalent per week   Drug use: No   Sexual activity: Yes  Other Topics Concern   Not on file  Social History Narrative   Married 1973. Wife has Parkinsons. 2 daughters, 1 son (middle). 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, Journalist, newspaper, Diplomatic Services operational officer   Social Drivers of Health   Financial Resource Strain: Low Risk  (01/12/2024)   Overall Financial Resource Strain (CARDIA)    Difficulty of Paying Living Expenses: Not hard at all  Food Insecurity: No Food Insecurity (01/12/2024)   Hunger Vital Sign    Worried About Running Out of Food in the Last Year: Never true    Ran Out of Food in the Last Year: Never true  Transportation Needs: No Transportation Needs (01/12/2024)   PRAPARE - Administrator, Civil Service (Medical): No    Lack of Transportation (Non-Medical): No  Physical Activity: Sufficiently Active (01/12/2024)   Exercise Vital Sign    Days of Exercise per Week: 3 days    Minutes of  Exercise per Session: 60 min  Stress: No Stress Concern Present (01/12/2024)   [employer] of Occupational Health - Occupational Stress Questionnaire    Feeling of Stress: Only a little  Social Connections: Moderately Integrated (01/12/2024)   Social Connection and Isolation Panel    Frequency of Communication with Friends and Family: More than three times a week    Frequency of Social Gatherings with Friends and Family: Twice a week    Attends Religious Services: Patient declined    Database administrator or Organizations: Yes    Attends Engineer, structural: 1 to 4 times per year    Marital Status: Married    Tobacco Counseling Counseling given: Not Answered    Clinical Intake:  Pre-visit preparation completed: Yes  Pain : No/denies pain     BMI - recorded: 30.85 Nutritional Status: BMI > 30  Obese Nutritional Risks: None Diabetes: No  Lab Results  Component Value Date   HGBA1C 6.6 (H) 11/07/2023   HGBA1C 6.8 (H) 05/13/2023   HGBA1C 6.7 (H) 01/02/2023     How often do you need to have someone help you when you read instructions, pamphlets, or other written materials from your doctor or pharmacy?: 1 - Never  Interpreter Needed?: No  Information entered by :: Ellouise Haws, LPN   Activities of Daily Living     01/12/2024    1:31 PM  In your present state of health, do you have any difficulty performing the following activities:  Hearing? 1  Comment high frequency loss  Vision? 0  Difficulty concentrating or making decisions? 0  Walking or climbing stairs? 0  Dressing or bathing? 0  Doing errands, shopping? 0  Preparing Food and eating ? N  Using the Toilet? N  In the past six months, have you accidently leaked urine? N  Do you have problems with loss of bowel control? N  Managing your Medications? N  Managing your Finances? N  Housekeeping or managing your Housekeeping? N    Patient Care Team: Katrinka Garnette KIDD, MD as PCP - General  (Family Medicine) Bonny Medico, MD as Referring Physician (Oncology) Lanny Brought, MD as Consulting Physician (Neurology) Boisvert, Waynette Pine, MD as Consulting Physician (Ophthalmology) Consuella Odor Diprincipe, MD as Consulting Physician (Endocrinology) Petra Pac  Dale, MD as Consulting Physician (Pulmonary Disease) Cardones, Adela Eleanora Highland, MD as Consulting Physician (Dermatology) Elta Fonda SQUIBB, MD as Consulting Physician (Gastroenterology)  I have updated your Care Teams any recent Medical Services you may have received from other providers in the past year.     Assessment:   This is a routine wellness examination for [place].  Hearing/Vision screen Hearing Screening - Comments:: High frequency loss  Vision Screening - Comments:: Wears rx glasses - up to date with routine eye exams with [facility]    Goals Addressed             This Visit's Progress    Patient Stated       Maintain health and activity        Depression Screen     01/13/2024    8:11 AM 07/05/2023   10:01 AM 05/15/2023   10:42 AM 01/07/2023    8:25 AM 01/08/2022    4:51 PM 12/25/2021    9:08 AM 01/17/2021    8:41 AM  PHQ 2/9 Scores  PHQ - 2 Score 0 0 0 0 3 0 0  PHQ- 9 Score  0 0  7      Fall Risk     01/12/2024    1:31 PM 07/05/2023   10:01 AM 05/15/2023   10:42 AM 01/03/2023    8:59 PM 12/25/2021    9:10 AM  Fall Risk   Falls in the past year? 0 0 0 0 0  Number falls in past yr: 0 0 0  0  Injury with Fall? 0 0 0  0  Risk for fall due to : No Fall Risks No Fall Risks No Fall Risks Impaired vision;Impaired balance/gait Impaired balance/gait;Impaired vision  Risk for fall due to: Comment     due to neuropathy  Follow up Falls prevention discussed Falls evaluation completed Falls evaluation completed Falls prevention discussed Falls prevention discussed      Data saved with a previous flowsheet row definition    MEDICARE RISK AT HOME:  Medicare Risk at Home Any  stairs in or around the home?: (Patient-Rptd) Yes If so, are there any without handrails?: (Patient-Rptd) No Home free of loose throw rugs in walkways, pet beds, electrical cords, etc?: (Patient-Rptd) Yes Adequate lighting in your home to reduce risk of falls?: (Patient-Rptd) Yes Life alert?: (Patient-Rptd) No Use of a cane, walker or w/c?: (Patient-Rptd) No Grab bars in the bathroom?: (Patient-Rptd) Yes Shower chair or bench in shower?: (Patient-Rptd) Yes Elevated toilet seat or a handicapped toilet?: (Patient-Rptd) Yes  TIMED UP AND GO:  Was the test performed?  No  Cognitive Function: 6CIT completed        01/13/2024    8:13 AM 01/07/2023    8:29 AM 12/25/2021    9:11 AM 09/08/2020    8:23 AM 05/06/2019   10:00 AM  6CIT Screen  What Year? 0 points 0 points 0 points 0 points 0 points  What month? 0 points 0 points 0 points 0 points 0 points  What time? 0 points 0 points 0 points  0 points  Count back from 20 0 points 0 points 0 points 0 points 0 points  Months in reverse 0 points 0 points 0 points 0 points 0 points  Repeat phrase 0 points 0 points 0 points 0 points 0 points  Total Score 0 points 0 points 0 points  0 points    Immunizations Immunization History  Administered Date(s) Administered  sv, Bivalent, Protein Subunit Rsvpref,pf (Abrysvo) 05/06/2023   DTaP / HiB / IPV 12/04/2017, 03/05/2018, 08/27/2018   Fluad Quad(high Dose 65+) 03/17/2020, 03/23/2022   Hepatitis A 12/25/2005, 11/25/2007   Hepatitis A, Adult 12/25/2005, 11/28/2007   Hepatitis B 05/25/1996, 06/25/1997, 12/01/1998   Hepatitis B, ADULT 12/04/2017, 03/05/2018, 08/27/2018   Influenza, High Dose Seasonal PF 07/09/2017, 04/16/2018, 02/17/2019, 03/29/2021, 03/23/2022, 03/19/2023   Influenza,inj,Quad PF,6+ Mos 02/18/2013, 05/03/2014   Influenza-Unspecified 04/02/1996, 04/19/1997, 03/16/2015, 02/17/2019, 03/19/2023   MMR 08/26/2020   Moderna Covid-19 Fall Seasonal Vaccine 13yrs & older 03/19/2023,  11/07/2023   Novel Infuenza-h1n1-09 07/01/2008   PFIZER Comirnaty(Gray Top)Covid-19 Tri-Sucrose Vaccine 03/23/2022   PFIZER(Purple Top)SARS-COV-2 Vaccination 07/28/2019, 08/20/2019, 02/10/2020, 07/26/2020   PPD Test 08/15/2012   Pfizer Covid-19 Vaccine Bivalent Booster 30yrs & up 04/19/2021, 10/18/2021   Pneumococcal Conjugate-13 05/11/2015, 12/04/2017, 03/05/2018, 08/27/2018   Pneumococcal Polysaccharide-23 05/03/2014, 05/09/2020   Respiratory Syncytial Virus Vaccine,Recomb Aduvanted(Arexvy) 05/06/2023   Td 12/05/2005   Tdap 12/05/2005, 08/27/2018   Typhoid Live 12/05/2005   Unspecified SARS-COV-2 Vaccination 03/19/2023   Yellow Fever 12/05/2005   Zoster Recombinant(Shingrix) 04/19/2021, 10/18/2021   Zoster, Live 02/15/2012    Screening Tests Health Maintenance  Topic Date Due   COVID-19 Vaccine (10 - 2024-25 season) 01/02/2024   INFLUENZA VACCINE  01/24/2024   Medicare Annual Wellness (AWV)  01/12/2025   DEXA SCAN  04/16/2025   DTaP/Tdap/Td (7 - Td or Tdap) 08/26/2028   Colonoscopy  11/20/2028   Pneumococcal Vaccine: 50+ Years  Completed   Hepatitis B Vaccines  Completed   Hepatitis C Screening  Completed   Zoster Vaccines- Shingrix  Completed   HPV VACCINES  Aged Out   Meningococcal B Vaccine  Aged Out    Health Maintenance  Health Maintenance Due  Topic Date Due   COVID-19 Vaccine (10 - 2024-25 season) 01/02/2024   Health Maintenance Items Addressed: See Nurse Notes at the end of this note  Additional Screening:  Vision Screening: Recommended annual ophthalmology exams for early detection of glaucoma and other disorders of the eye. Would you like a referral to an eye doctor? No    Dental Screening: Recommended annual dental exams for proper oral hygiene  Community Resource Referral / Chronic Care Management: CRR required this visit?  No   CCM required this visit?  No   Plan:    I have personally reviewed and noted the following in the patient's chart:    Medical and social history Use of alcohol , tobacco or illicit drugs  Current medications and supplements including opioid prescriptions. Patient is not currently taking opioid prescriptions. Functional ability and status Nutritional status Physical activity Advanced directives List of other physicians Hospitalizations, surgeries, and ER visits in previous 12 months Vitals Screenings to include cognitive, depression, and falls Referrals and appointments  In addition, I have reviewed and discussed with patient certain preventive protocols, quality metrics, and best practice recommendations. A written personalized care plan for preventive services as well as general preventive health recommendations were provided to patient.   Ellouise VEAR Haws, LPN   [id number]   After Visit Summary: (MyChart) Due to this being a telephonic visit, the after visit summary with patients personalized plan was offered to patient via MyChart   Notes: Nothing significant to report at this time.

## 2024-02-14 ENCOUNTER — Other Ambulatory Visit (INDEPENDENT_AMBULATORY_CARE_PROVIDER_SITE_OTHER)

## 2024-02-14 DIAGNOSIS — M79641 Pain in right hand: Secondary | ICD-10-CM

## 2024-02-14 DIAGNOSIS — E2740 Unspecified adrenocortical insufficiency: Secondary | ICD-10-CM | POA: Diagnosis not present

## 2024-02-14 DIAGNOSIS — R351 Nocturia: Secondary | ICD-10-CM | POA: Diagnosis not present

## 2024-02-14 DIAGNOSIS — M79642 Pain in left hand: Secondary | ICD-10-CM | POA: Diagnosis not present

## 2024-02-14 LAB — RENAL FUNCTION PANEL
Albumin: 3.9 g/dL (ref 3.5–5.2)
BUN: 13 mg/dL (ref 6–23)
CO2: 27 meq/L (ref 19–32)
Calcium: 9.4 mg/dL (ref 8.4–10.5)
Chloride: 100 meq/L (ref 96–112)
Creatinine, Ser: 1.04 mg/dL (ref 0.40–1.50)
GFR: 70.75 mL/min (ref >60.00)
Glucose, Bld: 109 mg/dL — ABNORMAL HIGH (ref 70–99)
Phosphorus: 3.6 mg/dL (ref 2.3–4.6)
Potassium: 4.4 meq/L (ref 3.5–5.1)
Sodium: 137 meq/L (ref 135–145)

## 2024-02-14 LAB — CORTISOL: Cortisol, Plasma: 10.6 ug/dL

## 2024-02-14 LAB — PSA: PSA: 4.58 ng/mL — ABNORMAL HIGH (ref 0.10–4.00)

## 2024-02-16 LAB — ANA W/REFLEX IF POSITIVE: Anti Nuclear Antibody (ANA): NEGATIVE

## 2024-02-17 ENCOUNTER — Other Ambulatory Visit: Payer: Self-pay | Admitting: Family Medicine

## 2024-02-17 DIAGNOSIS — R972 Elevated prostate specific antigen [PSA]: Secondary | ICD-10-CM

## 2024-02-17 LAB — RHEUMATOID FACTOR: Rheumatoid fact SerPl-aCnc: <10 [IU]/mL (ref <14)

## 2024-02-17 LAB — ACTH: C206 ACTH: 44 pg/mL (ref 6–50)

## 2024-02-18 DIAGNOSIS — K117 Disturbances of salivary secretion: Secondary | ICD-10-CM | POA: Diagnosis not present

## 2024-02-18 DIAGNOSIS — D89813 Graft-versus-host disease, unspecified: Secondary | ICD-10-CM | POA: Diagnosis not present

## 2024-02-18 DIAGNOSIS — K1321 Leukoplakia of oral mucosa, including tongue: Secondary | ICD-10-CM | POA: Diagnosis not present

## 2024-02-21 DIAGNOSIS — E2740 Unspecified adrenocortical insufficiency: Secondary | ICD-10-CM | POA: Diagnosis not present

## 2024-02-21 DIAGNOSIS — R7303 Prediabetes: Secondary | ICD-10-CM | POA: Diagnosis not present

## 2024-02-21 DIAGNOSIS — R7309 Other abnormal glucose: Secondary | ICD-10-CM | POA: Diagnosis not present

## 2024-02-21 LAB — HEMOGLOBIN A1C: Hemoglobin A1C: 6.3

## 2024-03-03 DIAGNOSIS — I10 Essential (primary) hypertension: Secondary | ICD-10-CM | POA: Diagnosis not present

## 2024-03-03 DIAGNOSIS — D469 Myelodysplastic syndrome, unspecified: Secondary | ICD-10-CM | POA: Diagnosis not present

## 2024-03-03 DIAGNOSIS — D89811 Chronic graft-versus-host disease: Secondary | ICD-10-CM | POA: Diagnosis not present

## 2024-03-03 DIAGNOSIS — I480 Paroxysmal atrial fibrillation: Secondary | ICD-10-CM | POA: Diagnosis not present

## 2024-03-03 DIAGNOSIS — Z79899 Other long term (current) drug therapy: Secondary | ICD-10-CM | POA: Diagnosis not present

## 2024-03-03 DIAGNOSIS — Z7901 Long term (current) use of anticoagulants: Secondary | ICD-10-CM | POA: Diagnosis not present

## 2024-03-03 DIAGNOSIS — Z9481 Bone marrow transplant status: Secondary | ICD-10-CM | POA: Diagnosis not present

## 2024-03-03 DIAGNOSIS — T8609 Other complications of bone marrow transplant: Secondary | ICD-10-CM | POA: Diagnosis not present

## 2024-03-03 DIAGNOSIS — E782 Mixed hyperlipidemia: Secondary | ICD-10-CM | POA: Diagnosis not present

## 2024-03-05 DIAGNOSIS — D485 Neoplasm of uncertain behavior of skin: Secondary | ICD-10-CM | POA: Diagnosis not present

## 2024-03-05 DIAGNOSIS — L821 Other seborrheic keratosis: Secondary | ICD-10-CM | POA: Diagnosis not present

## 2024-03-05 DIAGNOSIS — L309 Dermatitis, unspecified: Secondary | ICD-10-CM | POA: Diagnosis not present

## 2024-03-05 DIAGNOSIS — D487 Neoplasm of uncertain behavior of other specified sites: Secondary | ICD-10-CM | POA: Diagnosis not present

## 2024-03-05 DIAGNOSIS — L570 Actinic keratosis: Secondary | ICD-10-CM | POA: Diagnosis not present

## 2024-03-05 DIAGNOSIS — L720 Epidermal cyst: Secondary | ICD-10-CM | POA: Diagnosis not present

## 2024-03-05 DIAGNOSIS — L820 Inflamed seborrheic keratosis: Secondary | ICD-10-CM | POA: Diagnosis not present

## 2024-03-05 DIAGNOSIS — Z85828 Personal history of other malignant neoplasm of skin: Secondary | ICD-10-CM | POA: Diagnosis not present

## 2024-03-16 ENCOUNTER — Ambulatory Visit: Payer: Self-pay | Admitting: Family Medicine

## 2024-03-16 ENCOUNTER — Other Ambulatory Visit (INDEPENDENT_AMBULATORY_CARE_PROVIDER_SITE_OTHER)

## 2024-03-16 DIAGNOSIS — R972 Elevated prostate specific antigen [PSA]: Secondary | ICD-10-CM

## 2024-03-16 LAB — PSA: PSA: 3.11 ng/mL (ref 0.10–4.00)

## 2024-03-19 ENCOUNTER — Encounter: Payer: Self-pay | Admitting: Family Medicine

## 2024-04-15 DIAGNOSIS — T8609 Other complications of bone marrow transplant: Secondary | ICD-10-CM | POA: Diagnosis not present

## 2024-04-15 DIAGNOSIS — G4730 Sleep apnea, unspecified: Secondary | ICD-10-CM | POA: Diagnosis not present

## 2024-04-15 DIAGNOSIS — E871 Hypo-osmolality and hyponatremia: Secondary | ICD-10-CM | POA: Diagnosis not present

## 2024-04-15 DIAGNOSIS — I10 Essential (primary) hypertension: Secondary | ICD-10-CM | POA: Diagnosis not present

## 2024-04-15 DIAGNOSIS — K1370 Unspecified lesions of oral mucosa: Secondary | ICD-10-CM | POA: Diagnosis not present

## 2024-04-15 DIAGNOSIS — M1990 Unspecified osteoarthritis, unspecified site: Secondary | ICD-10-CM | POA: Diagnosis not present

## 2024-04-15 DIAGNOSIS — D89811 Chronic graft-versus-host disease: Secondary | ICD-10-CM | POA: Diagnosis not present

## 2024-04-15 DIAGNOSIS — K2100 Gastro-esophageal reflux disease with esophagitis, without bleeding: Secondary | ICD-10-CM | POA: Diagnosis not present

## 2024-04-15 DIAGNOSIS — C9200 Acute myeloblastic leukemia, not having achieved remission: Secondary | ICD-10-CM | POA: Diagnosis not present

## 2024-04-15 DIAGNOSIS — I82441 Acute embolism and thrombosis of right tibial vein: Secondary | ICD-10-CM | POA: Diagnosis not present

## 2024-04-15 DIAGNOSIS — E272 Addisonian crisis: Secondary | ICD-10-CM | POA: Diagnosis not present

## 2024-04-15 DIAGNOSIS — R972 Elevated prostate specific antigen [PSA]: Secondary | ICD-10-CM | POA: Diagnosis not present

## 2024-04-15 DIAGNOSIS — E7800 Pure hypercholesterolemia, unspecified: Secondary | ICD-10-CM | POA: Diagnosis not present

## 2024-04-15 DIAGNOSIS — Z79899 Other long term (current) drug therapy: Secondary | ICD-10-CM | POA: Diagnosis not present

## 2024-04-15 DIAGNOSIS — D75839 Thrombocytosis, unspecified: Secondary | ICD-10-CM | POA: Diagnosis not present

## 2024-05-13 ENCOUNTER — Other Ambulatory Visit

## 2024-05-18 ENCOUNTER — Ambulatory Visit: Admitting: Family Medicine

## 2024-05-18 ENCOUNTER — Encounter: Payer: Self-pay | Admitting: Family Medicine

## 2024-05-18 VITALS — BP 138/78 | HR 72 | Temp 97.8°F | Ht 70.0 in | Wt 217.6 lb

## 2024-05-18 DIAGNOSIS — T380X5A Adverse effect of glucocorticoids and synthetic analogues, initial encounter: Secondary | ICD-10-CM

## 2024-05-18 DIAGNOSIS — K1321 Leukoplakia of oral mucosa, including tongue: Secondary | ICD-10-CM | POA: Diagnosis not present

## 2024-05-18 DIAGNOSIS — Z9484 Stem cells transplant status: Secondary | ICD-10-CM | POA: Diagnosis not present

## 2024-05-18 DIAGNOSIS — R739 Hyperglycemia, unspecified: Secondary | ICD-10-CM | POA: Diagnosis not present

## 2024-05-18 DIAGNOSIS — I480 Paroxysmal atrial fibrillation: Secondary | ICD-10-CM

## 2024-05-18 DIAGNOSIS — K117 Disturbances of salivary secretion: Secondary | ICD-10-CM | POA: Diagnosis not present

## 2024-05-18 DIAGNOSIS — D89813 Graft-versus-host disease, unspecified: Secondary | ICD-10-CM | POA: Diagnosis not present

## 2024-05-18 DIAGNOSIS — E785 Hyperlipidemia, unspecified: Secondary | ICD-10-CM

## 2024-05-18 NOTE — Progress Notes (Signed)
 Phone [phone] In person visit   Subjective:   Brandon [name] is a 73 y.o. year old very pleasant male patient who presents for/with See problem oriented charting Chief Complaint  Patient presents with   Medical Management of Chronic Issues    6 month follow up with PCP. No questions or concerns for today's appointment.  Last appointment with transplant Dr was Oct 22nd. Has seen Derm for rash, was given A Chemo Cream. Also GVHD in mouth, has follow up today.    Past Medical History-  Patient Active Problem List   Diagnosis Date Noted   Atrial fibrillation (HCC) 02/12/2022    Priority: High   Allergic rhinitis 10/03/2019    Priority: High   Pancreatic lesion 10/03/2019    Priority: High   History of CVA (cerebrovascular accident) 03/10/2019    Priority: High   Adrenal insufficiency 08/06/2017    Priority: High   GVHD (graft versus host disease) (HCC) 03/04/2017    Priority: High   History of myelodysplastic syndrome s/p stem cell transplant in 2017  12/06/2015    Priority: High   Steroid-induced hyperglycemia 08/16/2014    Priority: High   Erectile dysfunction 01/08/2022    Priority: Medium    Fatty liver 01/05/2020    Priority: Medium    Osteopenia 10/05/2019    Priority: Medium    History of DVT (deep vein thrombosis) 09/25/2018    Priority: Medium    Post chemotherapy Dry eyes due to decreased tear production 08/06/2017    Priority: Medium    post chemotherapy Peripheral neuropathy 08/06/2017    Priority: Medium    BPH associated with nocturia 11/15/2014    Priority: Medium    Hyperlipidemia 02/04/2007    Priority: Medium    Essential hypertension 02/04/2007    Priority: Medium    History of stem cell transplant (HCC) 03/04/2017    Priority: Low   Leukopenia 11/15/2014    Priority: Low   Chest pain 08/13/2014    Priority: Low   GERD (gastroesophageal reflux disease) 05/03/2014    Priority: Low   Cervical disc disorder with radiculopathy of cervical  region 07/14/2010    Priority: Low   MIXED HEARING LOSS BILATERAL 07/14/2010    Priority: Low   ERECTILE DYSFUNCTION 02/07/2007    Priority: Low   AKI (acute kidney injury) 02/12/2022   Leukocytosis 02/12/2022    Medications- reviewed and updated Current Outpatient Medications  Medication Sig Dispense Refill   acetaminophen  (TYLENOL ) 500 MG tablet Take 500-1,000 mg by mouth every 6 (six) hours as needed for mild pain or headache.     albuterol  (VENTOLIN  HFA) 108 (90 Base) MCG/ACT inhaler Inhale 2 puffs into the lungs in the morning and at bedtime.     amLODipine  (NORVASC ) 10 MG tablet TAKE 1 TABLET BY MOUTH EVERY DAY 90 tablet 3   amoxicillin  (AMOXIL ) 500 MG capsule Take 2,000 mg by mouth See admin instructions. Take 2,000 mg by mouth one hour prior to dental appointments     apixaban  (ELIQUIS ) 5 MG TABS tablet Take 1 tablet (5 mg total) by mouth 2 (two) times daily. 60 tablet 3   Carboxymethylcellul-Glycerin (REFRESH RELIEVA OP) Place 1 drop into both eyes 4 (four) times daily.     Cholecalciferol  (VITAMIN D ) 50 MCG (2000 UT) CAPS Take 2,000 Units by mouth daily with supper.     diltiazem  (CARDIZEM ) 30 MG tablet Take 30 mg by mouth daily as needed. For elevated HR     fluticasone  (FLONASE )  50 MCG/ACT nasal spray Place 1-2 sprays into both nostrils daily as needed (for seasonal allergies).     Lancets (ONETOUCH DELICA PLUS LANCET33G) MISC      loratadine  (CLARITIN ) 10 MG tablet Take 10 mg by mouth daily as needed (for seasonal allergies).     Magnesium  Oxide 400 MG CAPS Take 1 capsule (400 mg total) by mouth daily with supper.     metFORMIN  (GLUCOPHAGE ) 500 MG tablet Take 1,000 mg by mouth 2 (two) times daily.     metoprolol  succinate (TOPROL -XL) 50 MG 24 hr tablet Take 1 tablet (50 mg total) by mouth daily. Take with or immediately following a meal. 90 tablet 3   Multiple Vitamin (MULTI-VITAMIN) tablet Take 1 tablet by mouth daily with breakfast.     niacinamide 500 MG tablet Take 500 mg  by mouth 2 (two) times daily with a meal.     NONFORMULARY OR COMPOUNDED ITEM FLUOROURACIL 5% + CALCIPOTRIENE 0.005%     omeprazole  (PRILOSEC ) 40 MG capsule Take 40 mg by mouth daily.     ONETOUCH ULTRA test strip TEST 2 TIMES DAILY USE AS INSTRUCTED.     PRESCRIPTION MEDICATION Place 1 application  onto teeth See admin instructions. Sodium Fluoride 5000 dry mouth toothpaste- Brush teeth for 2 minutes using the toothpaste daily as directed     PULMICORT  FLEXHALER 180 MCG/ACT inhaler Inhale 1 puff into the lungs in the morning and at bedtime.     RESTASIS 0.05 % ophthalmic emulsion Apply 1 drop to eye.     rosuvastatin  (CRESTOR ) 20 MG tablet Take 1 tablet (20 mg total) by mouth daily. 90 tablet 3   sildenafil  (VIAGRA ) 100 MG tablet Take 1 tablet (100 mg total) by mouth daily as needed for erectile dysfunction. 30 tablet 3   sodium chloride  (OCEAN) 0.65 % SOLN nasal spray Place 1-2 sprays into both nostrils in the morning and at bedtime.     SODIUM FLUORIDE 5000 PPM 1.1 % GEL dental gel SMARTSIG:sparingly     venlafaxine  XR (EFFEXOR -XR) 75 MG 24 hr capsule TAKE 1 CAPSULE BY MOUTH DAILY WITH BREAKFAST. 90 capsule 2   No current facility-administered medications for this visit.     Objective:  BP 138/78   Pulse 72   Temp 97.8 F (36.6 C) (Temporal)   Ht 5' 10 (1.778 m)   Wt 217 lb 9.6 oz (98.7 kg)   SpO2 98%   BMI 31.22 kg/m  Gen: NAD, resting comfortably CV: RRR no murmurs rubs or gallops Lungs: CTAB no crackles, wheeze, rhonchi Ext: no edema Skin: warm, dry     Assessment and Plan   #social update- went on cruise with wife and they enjoyed it- but was tough on walking  #osteopenia- #s better last visit- recheck in 2026 after 2023 scan. Improved last visit  #Bilateral hand pain- ongoing with metacarpophalangeal joint joints in particular and proximal interphalangeal on right. Labs were ok from rheumatology perspective. Some days are great and other days very bothersome- BC cream  helps some  # Graft-versus-host disease-has follow-up today to discuss this again for follow up - monitoring  # Rash-has seen dermatology and was given a chemo cream which has been helpful- used early November and has recheck coming   # Pancreatic cyst-follow up MRI of the abd- pancreas focus- now followed at [facility] -Stable on 09/06/2023 with 1 year imaging and 6 month bloodwork as long as stable- has another scan in march   #History of myelodysplastic syndrome that transitioned  to AML history-has continued to do well and now over 8 years out from stem cell transplant - congratulated on transplant anniversary   #Graft-versus-host disease-on Pulmicort - doing well with this and continuing -2024 November coming off singulair    % Adrenal insufficiency-follows with [facility] endocrinology Dr. Victoria hydrocortisone  in November 2024 - over a year  # Atrial fibrillation-new onset August 2023- follows at [facility] #history of stroke prior to detection- thankfully mild S: Rate controlled with metoprolol  50 mg XR daily, diltiazem  30 mg daily as needed for elevated heart rate Anticoagulated with Eliquis  5 mg twice daily A/P: appropriately anticoagulated and rate controlled- continue current medicine     #hypertension S: medication: Amlodipine  10 mg,  metoprolol  50 mg XR BP Readings from Last 3 Encounters:  05/18/24 138/78  11/14/23 110/64  07/05/23 124/82  A/P: slightly higher today but has bene well controlled at home  #hyperlipidemia S: Medication:Rosuvastatin  40 mg--> 20 mg and triglyceride(s) up some  Lab Results  Component Value Date   CHOL 125 11/07/2023   HDL 44.80 11/07/2023   LDLCALC 28 11/07/2023   LDLDIRECT 77 02/17/2020   TRIG 258.0 (H) 11/07/2023   CHOLHDL 3 11/07/2023  A/P: reasonable control- focus on healthy eating and regular exercise  - recheck next year but has lost some weight and may improve- congruatlate dhim  #Steroid induced hyperglycemia S: Medication:Metformin  1000 mg  twice daily Lab Results  Component Value Date   HGBA1C 6.6 (H) 11/07/2023   HGBA1C 6.8 (H) 05/13/2023   HGBA1C 6.7 (H) 01/02/2023  A/P: a1c 02/21/24 with [facility] well controlled - continue current medications   #/Depressed mood S: Medication: Venlafaxine /Effexor  75 mg XR     05/18/2024    9:43 AM 01/13/2024    8:11 AM 07/05/2023   10:01 AM  Depression screen PHQ 2/9  Decreased Interest 0 0 0  Down, Depressed, Hopeless 0 0 0  PHQ - 2 Score 0 0 0  Altered sleeping 0  0  Tired, decreased energy 1  0  Change in appetite 0  0  Feeling bad or failure about yourself  0  0  Trouble concentrating 0  0  Moving slowly or fidgety/restless 0  0  Suicidal thoughts 0  0  PHQ-9 Score 1  0   Difficult doing work/chores Not difficult at all  Not difficult at all     Data saved with a previous flowsheet row definition  A/P: Full remission- continue current medications     Recommended follow up: Return in about 6 months (around 11/15/2024) for followup or sooner if needed.Schedule b4 you leave. Future Appointments  Date Time Provider Department Center  01/18/2025  8:00 AM [facility] ANNUAL WELLNESS VISIT 1 [facility] [place]    Lab/Order associations:   ICD-10-CM   1. Steroid-induced hyperglycemia  R73.9    T38.0X5A     2. History of stem cell transplant (HCC)  Z94.84     3. Paroxysmal atrial fibrillation (HCC)  I48.0     4. GVHD (graft versus host disease) (HCC)  D89.813     5. Hyperlipidemia, unspecified hyperlipidemia type  E78.5       No orders of the defined types were placed in this encounter.   Return precautions advised.  Garnette Lukes, MD

## 2024-05-18 NOTE — Patient Instructions (Addendum)
 Thrilled you are doing so well- labs next visit  Bone density next visit we will plan on ordering  Recommended follow up: Return in about 6 months (around 11/15/2024) for followup or sooner if needed.Schedule b4 you leave.

## 2024-05-25 DIAGNOSIS — E099 Drug or chemical induced diabetes mellitus without complications: Secondary | ICD-10-CM | POA: Diagnosis not present

## 2024-05-25 DIAGNOSIS — H04123 Dry eye syndrome of bilateral lacrimal glands: Secondary | ICD-10-CM | POA: Diagnosis not present

## 2024-05-25 DIAGNOSIS — Z961 Presence of intraocular lens: Secondary | ICD-10-CM | POA: Diagnosis not present

## 2024-05-25 DIAGNOSIS — T380X5A Adverse effect of glucocorticoids and synthetic analogues, initial encounter: Secondary | ICD-10-CM | POA: Diagnosis not present

## 2024-06-04 DIAGNOSIS — L570 Actinic keratosis: Secondary | ICD-10-CM | POA: Diagnosis not present

## 2024-06-04 DIAGNOSIS — Z85828 Personal history of other malignant neoplasm of skin: Secondary | ICD-10-CM | POA: Diagnosis not present

## 2024-06-21 ENCOUNTER — Other Ambulatory Visit: Payer: Self-pay | Admitting: Family Medicine

## 2024-07-24 ENCOUNTER — Other Ambulatory Visit: Payer: Self-pay | Admitting: Family Medicine

## 2024-11-17 ENCOUNTER — Ambulatory Visit: Admitting: Family Medicine

## 2025-01-18 ENCOUNTER — Ambulatory Visit
